# Patient Record
Sex: Male | Born: 1950 | Race: White | Hispanic: No | Marital: Single | Smoking: Never smoker
Health system: Southern US, Community
[De-identification: ages and names within clinical notes are randomized; demographics above are authoritative.]

## PROBLEM LIST (undated history)

## (undated) DIAGNOSIS — J449 Chronic obstructive pulmonary disease, unspecified: Secondary | ICD-10-CM

## (undated) DIAGNOSIS — I4891 Unspecified atrial fibrillation: Secondary | ICD-10-CM

## (undated) DIAGNOSIS — G5793 Unspecified mononeuropathy of bilateral lower limbs: Secondary | ICD-10-CM

## (undated) DIAGNOSIS — G4733 Obstructive sleep apnea (adult) (pediatric): Secondary | ICD-10-CM

## (undated) DIAGNOSIS — E291 Testicular hypofunction: Secondary | ICD-10-CM

## (undated) DIAGNOSIS — Z87442 Personal history of urinary calculi: Secondary | ICD-10-CM

## (undated) DIAGNOSIS — N261 Atrophy of kidney (terminal): Secondary | ICD-10-CM

## (undated) DIAGNOSIS — I82409 Acute embolism and thrombosis of unspecified deep veins of unspecified lower extremity: Secondary | ICD-10-CM

## (undated) DIAGNOSIS — G4730 Sleep apnea, unspecified: Secondary | ICD-10-CM

## (undated) DIAGNOSIS — C801 Malignant (primary) neoplasm, unspecified: Secondary | ICD-10-CM

## (undated) DIAGNOSIS — T8859XA Other complications of anesthesia, initial encounter: Secondary | ICD-10-CM

## (undated) DIAGNOSIS — J189 Pneumonia, unspecified organism: Secondary | ICD-10-CM

## (undated) DIAGNOSIS — D333 Benign neoplasm of cranial nerves: Secondary | ICD-10-CM

## (undated) DIAGNOSIS — M1990 Unspecified osteoarthritis, unspecified site: Secondary | ICD-10-CM

## (undated) DIAGNOSIS — D689 Coagulation defect, unspecified: Secondary | ICD-10-CM

## (undated) DIAGNOSIS — K219 Gastro-esophageal reflux disease without esophagitis: Secondary | ICD-10-CM

## (undated) DIAGNOSIS — R112 Nausea with vomiting, unspecified: Secondary | ICD-10-CM

## (undated) DIAGNOSIS — F329 Major depressive disorder, single episode, unspecified: Secondary | ICD-10-CM

## (undated) DIAGNOSIS — Z9889 Other specified postprocedural states: Secondary | ICD-10-CM

## (undated) DIAGNOSIS — M329 Systemic lupus erythematosus, unspecified: Secondary | ICD-10-CM

## (undated) DIAGNOSIS — N189 Chronic kidney disease, unspecified: Secondary | ICD-10-CM

## (undated) DIAGNOSIS — T7840XA Allergy, unspecified, initial encounter: Secondary | ICD-10-CM

## (undated) DIAGNOSIS — I10 Essential (primary) hypertension: Secondary | ICD-10-CM

## (undated) DIAGNOSIS — F419 Anxiety disorder, unspecified: Secondary | ICD-10-CM

## (undated) DIAGNOSIS — D649 Anemia, unspecified: Secondary | ICD-10-CM

## (undated) DIAGNOSIS — K635 Polyp of colon: Secondary | ICD-10-CM

## (undated) DIAGNOSIS — M722 Plantar fascial fibromatosis: Secondary | ICD-10-CM

## (undated) DIAGNOSIS — T4145XA Adverse effect of unspecified anesthetic, initial encounter: Secondary | ICD-10-CM

## (undated) DIAGNOSIS — F32A Depression, unspecified: Secondary | ICD-10-CM

## (undated) HISTORY — PX: UPPER GASTROINTESTINAL ENDOSCOPY: SHX188

## (undated) HISTORY — DX: Testicular hypofunction: E29.1

## (undated) HISTORY — DX: Unspecified atrial fibrillation: I48.91

## (undated) HISTORY — PX: KIDNEY STONE SURGERY: SHX686

## (undated) HISTORY — DX: Benign neoplasm of cranial nerves: D33.3

## (undated) HISTORY — DX: Acute embolism and thrombosis of unspecified deep veins of unspecified lower extremity: I82.409

## (undated) HISTORY — DX: Atrophy of kidney (terminal): N26.1

## (undated) HISTORY — DX: Allergy, unspecified, initial encounter: T78.40XA

## (undated) HISTORY — DX: Systemic lupus erythematosus, unspecified: M32.9

## (undated) HISTORY — PX: TONSILLECTOMY: SUR1361

## (undated) HISTORY — DX: Obstructive sleep apnea (adult) (pediatric): G47.33

## (undated) HISTORY — DX: Sleep apnea, unspecified: G47.30

## (undated) HISTORY — DX: Major depressive disorder, single episode, unspecified: F32.9

## (undated) HISTORY — DX: Polyp of colon: K63.5

## (undated) HISTORY — DX: Coagulation defect, unspecified: D68.9

## (undated) HISTORY — PX: MOHS SURGERY: SHX181

## (undated) HISTORY — DX: Chronic obstructive pulmonary disease, unspecified: J44.9

## (undated) HISTORY — DX: Depression, unspecified: F32.A

## (undated) HISTORY — DX: Anemia, unspecified: D64.9

## (undated) HISTORY — DX: Plantar fascial fibromatosis: M72.2

---

## 2005-07-05 HISTORY — PX: CHEST WALL TUMOR EXCISION: SUR562

## 2006-07-05 HISTORY — PX: OTHER SURGICAL HISTORY: SHX169

## 2007-07-06 HISTORY — PX: OTHER SURGICAL HISTORY: SHX169

## 2008-10-17 ENCOUNTER — Encounter: Payer: Self-pay | Admitting: Family Medicine

## 2009-10-03 HISTORY — PX: COLONOSCOPY: SHX174

## 2009-10-15 ENCOUNTER — Encounter: Payer: Self-pay | Admitting: Family Medicine

## 2010-02-26 ENCOUNTER — Encounter: Payer: Self-pay | Admitting: Family Medicine

## 2010-03-06 ENCOUNTER — Encounter: Payer: Self-pay | Admitting: Family Medicine

## 2010-04-07 ENCOUNTER — Ambulatory Visit: Payer: Self-pay | Admitting: Family Medicine

## 2010-04-07 DIAGNOSIS — M722 Plantar fascial fibromatosis: Secondary | ICD-10-CM

## 2010-04-07 DIAGNOSIS — F418 Other specified anxiety disorders: Secondary | ICD-10-CM

## 2010-04-07 DIAGNOSIS — N138 Other obstructive and reflux uropathy: Secondary | ICD-10-CM | POA: Insufficient documentation

## 2010-04-07 DIAGNOSIS — E291 Testicular hypofunction: Secondary | ICD-10-CM

## 2010-04-07 DIAGNOSIS — G4733 Obstructive sleep apnea (adult) (pediatric): Secondary | ICD-10-CM | POA: Insufficient documentation

## 2010-04-07 DIAGNOSIS — J45909 Unspecified asthma, uncomplicated: Secondary | ICD-10-CM | POA: Insufficient documentation

## 2010-04-07 DIAGNOSIS — N529 Male erectile dysfunction, unspecified: Secondary | ICD-10-CM

## 2010-04-07 DIAGNOSIS — D333 Benign neoplasm of cranial nerves: Secondary | ICD-10-CM

## 2010-04-07 DIAGNOSIS — N401 Enlarged prostate with lower urinary tract symptoms: Secondary | ICD-10-CM

## 2010-04-08 DIAGNOSIS — Z860100 Personal history of colon polyps, unspecified: Secondary | ICD-10-CM | POA: Insufficient documentation

## 2010-04-08 DIAGNOSIS — Z87442 Personal history of urinary calculi: Secondary | ICD-10-CM

## 2010-08-04 NOTE — Letter (Signed)
Summary: Records from Dr. Estella Husk in [place] 2006 - 2011  Records from Dr. Estella Husk in [place] 2006 - 2011   Imported By: Maryln Gottron 04/16/2010 13:05:23  _____________________________________________________________________  External Attachment:    Type:   Image     Comment:   External Document

## 2010-08-04 NOTE — Letter (Signed)
Summary: Records from Dr. Lorelle Gibbs 2007 - 2010  Records from Dr. Daron Offer Megerian 2007 - 2010   Imported By: Maryln Gottron 05/04/2010 10:42:30  _____________________________________________________________________  External Attachment:    Type:   Image     Comment:   External Document

## 2010-08-04 NOTE — Letter (Signed)
Summary: Records from Dr. Estella Husk 2007 - 2011  Records from Dr. Estella Husk 2007 - 2011   Imported By: Maryln Gottron 04/28/2010 08:44:01  _____________________________________________________________________  External Attachment:    Type:   Image     Comment:   External Document

## 2010-08-04 NOTE — Assessment & Plan Note (Signed)
Summary: TO BE EST/NJR   Vital Signs:  Patient profile:   60 year old male Weight:      240 pounds O2 Sat:      95 % Temp:     99 degrees F Pulse rate:   84 / minute BP sitting:   130 / 84  (left arm)  Vitals Entered By: Pura Spice, RN (April 07, 2010 1:37 PM) CC: to establish congestion   History of Present Illness: 60 yr old male to establish with [place] after moving to [place] from [place], [place] a few weeks ago. He is doing well and has no concerns. He started on Androgel about 6 months ago for a low testosterone level, and he feels better with more energy and more libido. He had been taking Effexor for a few years for  depression, but he has weaned himself off that and is doing fine. He has retired but will look for a new job here after he gets more settled.   Preventive Screening-Counseling & Management  Alcohol-Tobacco     Smoking Status: never  Allergies (verified): 1)  ! Pcn 2)  ! Sulfa 3)  ! 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 Family History of Arthritis Family History of CAD male 1st degree relative <60 Family History High cholesterol Family History Diabetes 1st degree relative Family History of Stroke M 1st degree relative <50  Social History: Reviewed history and no changes required. Single Never Smoked Alcohol use-no Smoking Status:  never  Review of Systems  The patient denies anorexia, fever, weight loss, weight gain,  vision loss, decreased hearing, hoarseness, chest pain, syncope, dyspnea on exertion, peripheral edema, prolonged cough, headaches, hemoptysis, abdominal pain, melena, hematochezia, severe indigestion/heartburn, hematuria, incontinence, genital sores, muscle weakness, suspicious skin lesions, transient blindness, difficulty walking, depression, unusual weight change, abnormal bleeding, enlarged lymph nodes, angioedema, breast masses, and testicular masses.    Physical Exam  General:  Well-developed,well-nourished,in no acute distress; alert,appropriate and cooperative throughout examination Neck:  No deformities, masses, or tenderness noted. Lungs:  Normal respiratory effort, chest expands symmetrically. Lungs are clear to auscultation, no crackles or wheezes. Heart:  Normal rate and regular rhythm. S1 and S2 normal without gallop, murmur, click, rub or other extra sounds.   Impression & Recommendations:  Problem # 1:  DEPRESSION (ICD-311)  Problem # 2:  ASTHMA (ICD-493.90)  His updated medication list for this problem includes:    Ventolin Hfa 108 (90 Base) Mcg/act Aers (Albuterol sulfate)  Problem # 3:  SLEEP APNEA, OBSTRUCTIVE (ICD-327.23)  Problem # 4:  BENIGN PROSTATIC HYPERTROPHY, WITH OBSTRUCTION (ICD-600.01)  Problem # 5:  ERECTILE DYSFUNCTION, ORGANIC (ICD-607.84)  His updated medication list for this problem includes:    Viagra 100 Mg Tabs (Sildenafil citrate)  Problem # 6:  HYPOGONADISM (ICD-257.2)  Problem # 7:  PLANTAR FASCIITIS (ICD-728.71)  Problem # 8:  ACOUSTIC NEUROMA (ICD-225.1)  Complete Medication List: 1)  Flomax 0.4 Mg Caps (Tamsulosin hcl) 2)  Androgel Pump 1.25 Gm/act (1%) Gel (Testosterone) .... 4  pumps once daily 3)  Viagra 100 Mg Tabs (Sildenafil citrate) 4)  Ventolin Hfa 108 (90 Base) Mcg/act Aers (Albuterol sulfate) 5)  Aspirin 325 Mg Tabs (Aspirin)  Patient Instructions: 1)  Get old records. set up a cpx soon.

## 2010-08-04 NOTE — Letter (Signed)
Summary: Records from Dr. Carolynn Sayers, [facility] 2010 - 2011  Records from Dr. Carolynn Sayers, [facility] 2010 - 2011   Imported By: Maryln Gottron 04/21/2010 10:47:47  _____________________________________________________________________  External Attachment:    Type:   Image     Comment:   External Document

## 2010-10-23 ENCOUNTER — Ambulatory Visit (INDEPENDENT_AMBULATORY_CARE_PROVIDER_SITE_OTHER): Payer: BC Managed Care – PPO | Admitting: Family Medicine

## 2010-10-23 ENCOUNTER — Encounter: Payer: Self-pay | Admitting: Family Medicine

## 2010-10-23 VITALS — BP 110/78 | HR 80

## 2010-10-23 DIAGNOSIS — M25561 Pain in right knee: Secondary | ICD-10-CM

## 2010-10-23 DIAGNOSIS — M25569 Pain in unspecified knee: Secondary | ICD-10-CM

## 2010-10-23 MED ORDER — TESTOSTERONE 12.5 MG/ACT (1%) TD GEL: 4.0000 | Freq: Every day | TRANSDERMAL | Status: DC

## 2010-10-23 NOTE — Progress Notes (Signed)
  Subjective:    Patient ID: Joseph Tran, male    DOB: 24-Aug-1950, 60 y.o.   MRN: [id number]  HPI Here with right knee pain that started after he mowed his lawn 3 weeks ago, and it has persisted ever since. It is a sharp severe pain in the medial knee. No swelling or locking. He says it feels exactly like it did in 2008 when he had a torn meniscus while living in [place], [place]. He had this repaired arthroscopically. Now taking Tylenol and wearing the same metal brace he used back then.   Review of Systems  Constitutional: Negative.   Musculoskeletal: Positive for arthralgias.       Objective:   Physical Exam  Constitutional:       In pain, limping   Musculoskeletal:       Very tender in the medial joint space on the right knee. No edema. Full ROM but he has a lot of pain through medium flexion. No crepitus. 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 Use Motrin and ice. Refer to Orthopedics

## 2010-12-17 ENCOUNTER — Telehealth: Payer: Self-pay | Admitting: Family Medicine

## 2010-12-17 NOTE — Telephone Encounter (Signed)
Needs new rx for generic Flomax 0.4mg  sent to [facility]. Was rx'd by previous dr.

## 2010-12-21 MED ORDER — TAMSULOSIN HCL 0.4 MG PO CAPS: 0.4000 mg | ORAL_CAPSULE | Freq: Every day | ORAL | Status: DC

## 2010-12-21 NOTE — Telephone Encounter (Signed)
done

## 2010-12-28 ENCOUNTER — Telehealth: Payer: Self-pay | Admitting: Family Medicine

## 2010-12-28 NOTE — Telephone Encounter (Signed)
[facility] needs re-clarification on Andorgel 1% pump. Pls call asap.

## 2010-12-30 ENCOUNTER — Telehealth: Payer: Self-pay | Admitting: *Deleted

## 2010-12-30 NOTE — Telephone Encounter (Signed)
Needs clarification on Androgel 1% pump rx, need quantity and day supply.  The pumps are available in pre packaged boxes of 2 pumps per box that are 75 grams each.  We can not break the packaging.  The fax we received was for 3 bottle.  Please call [phone] option 0

## 2010-12-31 MED ORDER — TESTOSTERONE 12.5 MG/ACT (1%) TD GEL: 4.0000 | Freq: Every day | TRANSDERMAL | Status: DC

## 2010-12-31 NOTE — Telephone Encounter (Signed)
New rx faxed to pharmacy. 

## 2011-01-11 ENCOUNTER — Telehealth: Payer: Self-pay | Admitting: Family Medicine

## 2011-01-11 NOTE — Telephone Encounter (Signed)
Pt called and stated that [facility] would not fill because # of boxes was not on request. Last time pt had refilled it was for 3 boxes and that equals a 3 month supply. Please call and give correct amount.

## 2011-01-11 NOTE — Telephone Encounter (Signed)
[facility] and script is going to ship in 1-3 days and I gave info to pt.

## 2011-03-04 ENCOUNTER — Telehealth: Payer: Self-pay | Admitting: Family Medicine

## 2011-03-04 NOTE — Telephone Encounter (Signed)
Pt was rx'd Klonopin 1mg  prn by his previous doctor. Patient is requesting a new rx sent to [facility] on [address].

## 2011-03-05 NOTE — Telephone Encounter (Signed)
I tried to call both numbers and both are not working.

## 2011-03-05 NOTE — Telephone Encounter (Signed)
Pt called to check on status of getting generic Klonopin. Pls call in to [facility] on [address].

## 2011-03-05 NOTE — Telephone Encounter (Signed)
He would need an OV first

## 2011-04-02 ENCOUNTER — Other Ambulatory Visit (INDEPENDENT_AMBULATORY_CARE_PROVIDER_SITE_OTHER): Payer: BC Managed Care – PPO

## 2011-04-02 DIAGNOSIS — Z0000 Encounter for general adult medical examination without abnormal findings: Secondary | ICD-10-CM

## 2011-04-02 DIAGNOSIS — Z23 Encounter for immunization: Secondary | ICD-10-CM

## 2011-04-02 LAB — CBC WITH DIFFERENTIAL/PLATELET
Basophils Relative: 0.6 % (ref 0.0–3.0)
Eosinophils Absolute: 0.2 10*3/uL (ref 0.0–0.7)
Eosinophils Relative: 3 % (ref 0.0–5.0)
HCT: 47.2 % (ref 39.0–52.0)
Hemoglobin: 15.6 g/dL (ref 13.0–17.0)
Lymphs Abs: 1.1 10*3/uL (ref 0.7–4.0)
MCHC: 33 g/dL (ref 30.0–36.0)
MCV: 95.8 fl (ref 78.0–100.0)
Monocytes Absolute: 0.5 10*3/uL (ref 0.1–1.0)
Neutro Abs: 5 10*3/uL (ref 1.4–7.7)
Neutrophils Relative %: 73.8 % (ref 43.0–77.0)
RBC: 4.93 Mil/uL (ref 4.22–5.81)
WBC: 6.8 10*3/uL (ref 4.5–10.5)

## 2011-04-02 LAB — PSA: PSA: 1.66 ng/mL (ref 0.10–4.00)

## 2011-04-02 LAB — BASIC METABOLIC PANEL
CO2: 27 mEq/L (ref 19–32)
Chloride: 110 mEq/L (ref 96–112)
Creatinine, Ser: 1.4 mg/dL (ref 0.4–1.5)
Potassium: 4.5 mEq/L (ref 3.5–5.1)
Sodium: 143 mEq/L (ref 135–145)

## 2011-04-02 LAB — POCT URINALYSIS DIPSTICK
Ketones, UA: NEGATIVE
Protein, UA: NEGATIVE
Spec Grav, UA: 1.02
pH, UA: 5

## 2011-04-02 LAB — LIPID PANEL
HDL: 40.3 mg/dL (ref >39.00)
Total CHOL/HDL Ratio: 5
Triglycerides: 116 mg/dL (ref 0.0–149.0)

## 2011-04-02 LAB — TESTOSTERONE: Testosterone: 338.36 ng/dL — ABNORMAL LOW (ref 350.00–890.00)

## 2011-04-02 LAB — HEPATIC FUNCTION PANEL
ALT: 19 U/L (ref 0–53)
Albumin: 3.9 g/dL (ref 3.5–5.2)
Bilirubin, Direct: 0.2 mg/dL (ref 0.0–0.3)
Total Protein: 6.4 g/dL (ref 6.0–8.3)

## 2011-04-02 NOTE — Progress Notes (Signed)
Addended by: Romualdo Bolk on: 04/02/2011 09:13 AM   Modules accepted: Orders

## 2011-04-06 ENCOUNTER — Ambulatory Visit (INDEPENDENT_AMBULATORY_CARE_PROVIDER_SITE_OTHER): Payer: BC Managed Care – PPO | Admitting: Family Medicine

## 2011-04-06 ENCOUNTER — Encounter: Payer: Self-pay | Admitting: Family Medicine

## 2011-04-06 VITALS — BP 120/76 | HR 91 | Temp 98.6°F | Wt 235.0 lb

## 2011-04-06 DIAGNOSIS — R0789 Other chest pain: Secondary | ICD-10-CM

## 2011-04-06 DIAGNOSIS — J45909 Unspecified asthma, uncomplicated: Secondary | ICD-10-CM

## 2011-04-06 DIAGNOSIS — R071 Chest pain on breathing: Secondary | ICD-10-CM

## 2011-04-06 DIAGNOSIS — J45901 Unspecified asthma with (acute) exacerbation: Secondary | ICD-10-CM

## 2011-04-06 MED ORDER — METHYLPREDNISOLONE ACETATE 80 MG/ML IJ SUSP
120.0000 mg | Freq: Once | INTRAMUSCULAR | Status: AC
  Administered 2011-04-06: 120 mg via INTRAMUSCULAR

## 2011-04-06 MED ORDER — AZITHROMYCIN 250 MG PO TABS: ORAL_TABLET | ORAL | Status: AC

## 2011-04-06 MED ORDER — HYDROCODONE-ACETAMINOPHEN 5-500 MG PO TABS: 1.0000 | ORAL_TABLET | ORAL | Status: AC | PRN

## 2011-04-06 MED ORDER — ALBUTEROL SULFATE HFA 108 (90 BASE) MCG/ACT IN AERS: 2.0000 | INHALATION_SPRAY | RESPIRATORY_TRACT | Status: DC | PRN

## 2011-04-06 MED ORDER — HYDROCODONE-HOMATROPINE 5-1.5 MG/5ML PO SYRP: 5.0000 mL | ORAL_SOLUTION | ORAL | Status: DC | PRN

## 2011-04-06 NOTE — Progress Notes (Signed)
  Subjective:    Patient ID: Joseph Tran, male    DOB: 1950/11/06, 60 y.o.   MRN: [id number]  HPI Here for one week of hard dry coughing, SOB, and wheezing. Using his inhaler. Then last night he had the onset of a sharp severe pain at the lower left rib area. It hurts to take a breath now. No fever.    Review of Systems  Constitutional: Negative.   HENT: Negative.   Respiratory: Positive for cough, shortness of breath and wheezing.   Cardiovascular: Positive for chest pain.       Objective:   Physical Exam  Constitutional:       Coughing, winces with pain when he coughs   HENT:  Right Ear: External ear normal.  Left Ear: External ear normal.  Nose: Nose normal.  Mouth/Throat: Oropharynx is clear and moist. No oropharyngeal exudate.  Eyes: Conjunctivae are normal. Pupils are equal, round, and reactive to light.  Neck: No thyromegaly present.  Pulmonary/Chest: Effort normal. He has no rales. He exhibits no tenderness.       Scattered wheezes and rhonchi  Lymphadenopathy:    He has no cervical adenopathy.          Assessment & Plan:  He has bronchitis, and he probably tore an intercostal muscle from coughing. Use meds above. Off work today and tomorrow

## 2011-04-06 NOTE — Progress Notes (Signed)
Addended by: Aniceto Boss A on: 04/06/2011 09:45 AM   Modules accepted: Orders

## 2011-04-07 NOTE — Progress Notes (Signed)
Quick Note:  Pt aware ______ 

## 2011-04-13 ENCOUNTER — Ambulatory Visit (INDEPENDENT_AMBULATORY_CARE_PROVIDER_SITE_OTHER): Payer: BC Managed Care – PPO | Admitting: Family Medicine

## 2011-04-13 ENCOUNTER — Encounter: Payer: Self-pay | Admitting: Family Medicine

## 2011-04-13 VITALS — BP 122/80 | HR 81 | Temp 98.9°F | Ht 73.0 in | Wt 234.0 lb

## 2011-04-13 DIAGNOSIS — Z0000 Encounter for general adult medical examination without abnormal findings: Secondary | ICD-10-CM

## 2011-04-13 MED ORDER — HYDROCODONE-HOMATROPINE 5-1.5 MG/5ML PO SYRP: 5.0000 mL | ORAL_SOLUTION | ORAL | Status: AC | PRN

## 2011-04-13 MED ORDER — SILDENAFIL CITRATE 100 MG PO TABS: 100.0000 mg | ORAL_TABLET | Freq: Every day | ORAL | Status: DC | PRN

## 2011-04-13 MED ORDER — TESTOSTERONE 12.5 MG/ACT (1%) TD GEL: 6.0000 | Freq: Every day | TRANSDERMAL | Status: DC

## 2011-04-13 MED ORDER — PREDNISONE (PAK) 10 MG PO TABS: ORAL_TABLET | ORAL | Status: DC

## 2011-04-13 NOTE — Progress Notes (Signed)
  Subjective:    Patient ID: Joseph Tran, male    DOB: 06-Feb-1951, 60 y.o.   MRN: [id number]  HPI 60 yr old male for a cpx. He has been doing well except for chronic coughing. We saw him one week ago for an URI and gave him a Zpack. He feels better but the dry coughing has persisted. No fever or any other symptoms. He says this is his typical pattern when he gets a URI, that he coughs for weeks after that.    Review of Systems  Constitutional: Negative.   HENT: Negative.   Eyes: Negative.   Respiratory: Positive for cough. Negative for apnea, choking, chest tightness, shortness of breath, wheezing and stridor.   Cardiovascular: Negative.   Gastrointestinal: Negative.   Genitourinary: Negative.   Musculoskeletal: Negative.   Skin: Negative.   Neurological: Negative.   Hematological: Negative.   Psychiatric/Behavioral: Negative.        Objective:   Physical Exam  Constitutional: He is oriented to person, place, and time. He appears well-developed and well-nourished. No distress.  HENT:  Head: Normocephalic and atraumatic.  Right Ear: External ear normal.  Left Ear: External ear normal.  Nose: Nose normal.  Mouth/Throat: Oropharynx is clear and moist. No oropharyngeal exudate.  Eyes: Conjunctivae and EOM are normal. Pupils are equal, round, and reactive to light. Right eye exhibits no discharge. Left eye exhibits no discharge. No scleral icterus.  Neck: Neck supple. No JVD present. No tracheal deviation present. No thyromegaly present.  Cardiovascular: Normal rate, regular rhythm, normal heart sounds and intact distal pulses.  Exam reveals no gallop and no friction rub.   No murmur heard.      EKG normal   Pulmonary/Chest: Effort normal and breath sounds normal. No respiratory distress. He has no wheezes. He has no rales. He exhibits no tenderness.  Abdominal: Soft. Bowel sounds are normal. He exhibits no distension and no mass. There is no tenderness. There is no rebound and no  guarding.  Genitourinary: Rectum normal, prostate normal and penis normal. Guaiac negative stool. No penile tenderness.  Musculoskeletal: Normal range of motion. He exhibits no edema and no tenderness.  Lymphadenopathy:    He has no cervical adenopathy.  Neurological: He is alert and oriented to person, place, and time. He has normal reflexes. No cranial nerve deficit. He exhibits normal muscle tone. Coordination normal.  Skin: Skin is warm and dry. No rash noted. He is not diaphoretic. No erythema. No pallor.  Psychiatric: He has a normal mood and affect. His behavior is normal. Judgment and thought content normal.          Assessment & Plan:  We will increase the Androgel to 6 pumps a day, and recheck a level in 6 months. He will watch his diet closely to get the LDL down. Try a steroid taper pack to help the reactive airways cough.

## 2011-04-29 ENCOUNTER — Telehealth: Payer: Self-pay

## 2011-04-29 DIAGNOSIS — R05 Cough: Secondary | ICD-10-CM

## 2011-04-29 NOTE — Telephone Encounter (Signed)
Before we do anything else, he needs a CXR for this. I have put in the order, so he just needs to go to [facility] to get this done

## 2011-04-29 NOTE — Telephone Encounter (Signed)
Pt called and stated he was given ventolin for his asthma. Pt states the inhaler is not helping.  Pt states he is still coughing and would like to have another rx sent in to the pharmacy.  Pt's pharmacy is [facility] [address]

## 2011-04-29 NOTE — Telephone Encounter (Signed)
Left voice message.

## 2011-04-30 ENCOUNTER — Ambulatory Visit (INDEPENDENT_AMBULATORY_CARE_PROVIDER_SITE_OTHER)
Admission: RE | Admit: 2011-04-30 | Discharge: 2011-04-30 | Disposition: A | Discharge status: Home / Self Care | Payer: BC Managed Care – PPO | Source: Ambulatory Visit | Attending: Family Medicine | Admitting: Family Medicine

## 2011-04-30 DIAGNOSIS — R05 Cough: Secondary | ICD-10-CM

## 2011-05-04 ENCOUNTER — Telehealth: Payer: Self-pay | Admitting: Family Medicine

## 2011-05-04 MED ORDER — LEVOFLOXACIN 500 MG PO TABS: 500.0000 mg | ORAL_TABLET | Freq: Every day | ORAL | Status: AC

## 2011-05-04 NOTE — Telephone Encounter (Signed)
Message copied by Baldemar Friday on Tue May 04, 2011  6:01 PM ------      Message from: Gershon Crane A      Created: Mon May 03, 2011  5:57 AM       He has some emphysema (COPD) and possibly a small patch of pneumonia. Call in Levaquin 500 mg daily for 10 days

## 2011-05-04 NOTE — Telephone Encounter (Signed)
Script called in and left voice message for pt. 

## 2011-05-05 ENCOUNTER — Telehealth: Payer: Self-pay | Admitting: Family Medicine

## 2011-05-05 NOTE — Telephone Encounter (Signed)
I spoke with pt and called in script. I also filled out a work note to return on 05/10/11.

## 2011-05-05 NOTE — Telephone Encounter (Signed)
Message copied by Baldemar Friday on Wed May 05, 2011  9:16 AM ------      Message from: Gershon Crane A      Created: Mon May 03, 2011  5:57 AM       He has some emphysema (COPD) and possibly a small patch of pneumonia. Call in Levaquin 500 mg daily for 10 days

## 2011-05-21 ENCOUNTER — Encounter: Payer: Self-pay | Admitting: Family Medicine

## 2011-05-21 ENCOUNTER — Ambulatory Visit (INDEPENDENT_AMBULATORY_CARE_PROVIDER_SITE_OTHER): Payer: BC Managed Care – PPO | Admitting: Family Medicine

## 2011-05-21 VITALS — BP 128/86 | HR 86 | Temp 98.5°F | Wt 231.0 lb

## 2011-05-21 DIAGNOSIS — J449 Chronic obstructive pulmonary disease, unspecified: Secondary | ICD-10-CM

## 2011-05-21 NOTE — Progress Notes (Signed)
  Subjective:    Patient ID: Joseph Tran, male    DOB: [birth year], 60 y.o.   MRN: [id number]  HPI Here to discuss results of his recent CXR. This showed some early emphysema, and he has never been told this before. His last CXR in 2011 was reportedly normal. He has never smoked but he was around a lot of second hand smoke in the workplace for years. He has also had asthma since he was a child. He has no SOB and he can exercise without difficulty.    Review of Systems  Constitutional: Negative.   Respiratory: Negative.   Cardiovascular: Negative.        Objective:   Physical Exam  Constitutional: He appears well-developed and well-nourished.  Cardiovascular: Normal rate, regular rhythm, normal heart sounds and intact distal pulses.   Pulmonary/Chest: Effort normal and breath sounds normal. No respiratory distress. He has no wheezes. He has no rales. He exhibits no tenderness.          Assessment & Plan:  Early COPD. We discussed what to do about this. Proper nutrition and exercise are important. Recheck as planned

## 2011-06-28 ENCOUNTER — Telehealth: Payer: Self-pay

## 2011-06-28 NOTE — Telephone Encounter (Signed)
Pt states his androgel rx was written for the incorrect amount. Pt needs a new rx sent to [facility] stating 4 boxes of 150 mg for a 90 day supply.  Please call pt to verify this has been sent.  Thanks.

## 2011-06-30 MED ORDER — TESTOSTERONE 12.5 MG/ACT (1%) TD GEL: 6.0000 "application " | Freq: Every day | TRANSDERMAL | Status: DC

## 2011-06-30 NOTE — Telephone Encounter (Signed)
Script faxed and pt aware. 

## 2011-09-10 HISTORY — PX: KNEE ARTHROSCOPY: SHX127

## 2011-10-27 ENCOUNTER — Telehealth: Payer: Self-pay | Admitting: Family Medicine

## 2011-10-27 NOTE — Telephone Encounter (Signed)
Pt said that previous physician, in [place] Dr Jill Poling, faxed a letter to Dr Clent Ridges on 10/21/11 re: pt med generic Klonopin 1 mg take 1/2 to 1 tab twice daily prn. Pt said that he is needing to get a refill of this medicine called in [facility] [address] [address] [phone].  Pt has only 3 days worth of med remaining. Pls call pt if there is a problem.

## 2011-10-28 NOTE — Telephone Encounter (Signed)
Call in Clonazepam 1 mg bid prn anxiety, #60 with 5 rf

## 2011-10-29 MED ORDER — CLONAZEPAM 1 MG PO TABS: 1.0000 mg | ORAL_TABLET | Freq: Two times a day (BID) | ORAL | Status: DC | PRN

## 2011-10-29 NOTE — Telephone Encounter (Signed)
Script called in

## 2011-12-14 ENCOUNTER — Ambulatory Visit (INDEPENDENT_AMBULATORY_CARE_PROVIDER_SITE_OTHER): Payer: BC Managed Care – PPO | Admitting: Family Medicine

## 2011-12-14 ENCOUNTER — Encounter: Payer: Self-pay | Admitting: Family Medicine

## 2011-12-14 VITALS — BP 118/76 | HR 81 | Temp 98.3°F | Wt 227.0 lb

## 2011-12-14 DIAGNOSIS — N138 Other obstructive and reflux uropathy: Secondary | ICD-10-CM

## 2011-12-14 DIAGNOSIS — N139 Obstructive and reflux uropathy, unspecified: Secondary | ICD-10-CM

## 2011-12-14 DIAGNOSIS — N50819 Testicular pain, unspecified: Secondary | ICD-10-CM

## 2011-12-14 DIAGNOSIS — E291 Testicular hypofunction: Secondary | ICD-10-CM

## 2011-12-14 DIAGNOSIS — N401 Enlarged prostate with lower urinary tract symptoms: Secondary | ICD-10-CM

## 2011-12-14 DIAGNOSIS — N509 Disorder of male genital organs, unspecified: Secondary | ICD-10-CM

## 2011-12-14 DIAGNOSIS — E785 Hyperlipidemia, unspecified: Secondary | ICD-10-CM

## 2011-12-14 MED ORDER — TESTOSTERONE 12.5 MG/ACT (1%) TD GEL: 6.0000 "application " | Freq: Every day | TRANSDERMAL | Status: DC

## 2011-12-14 NOTE — Progress Notes (Signed)
  Subjective:    Patient ID: Dhilan Brauer, male    DOB: July 27, 1950, 61 y.o.   MRN: [id number]  HPI Here for intermittent pain in the left testicle for the past 3 months. No hx of trauma. No swelling. No urinary symptoms. No fever. The pain is never severe but is very uncomfortable.    Review of Systems  Constitutional: Negative.   Gastrointestinal: Negative.   Genitourinary: Positive for testicular pain. Negative for dysuria, urgency, frequency, hematuria, decreased urine volume, discharge, penile swelling, scrotal swelling, difficulty urinating and penile pain.       Objective:   Physical Exam  Constitutional: He appears well-developed and well-nourished.  Abdominal: Soft. Bowel sounds are normal. He exhibits no distension and no mass. There is no tenderness. There is no rebound and no guarding.       No hernias   Genitourinary: Rectum normal, prostate normal and penis normal. Guaiac negative stool. No penile tenderness.       No testicular tenderness or masses           Assessment & Plan:  It is unclear what the etiology of his pain may be. We will refer to Urology

## 2011-12-20 ENCOUNTER — Other Ambulatory Visit (INDEPENDENT_AMBULATORY_CARE_PROVIDER_SITE_OTHER): Payer: BC Managed Care – PPO

## 2011-12-20 DIAGNOSIS — E291 Testicular hypofunction: Secondary | ICD-10-CM

## 2011-12-20 DIAGNOSIS — N139 Obstructive and reflux uropathy, unspecified: Secondary | ICD-10-CM

## 2011-12-20 DIAGNOSIS — E785 Hyperlipidemia, unspecified: Secondary | ICD-10-CM

## 2011-12-20 DIAGNOSIS — N401 Enlarged prostate with lower urinary tract symptoms: Secondary | ICD-10-CM

## 2011-12-20 LAB — PSA: PSA: 2.76 ng/mL (ref 0.10–4.00)

## 2011-12-20 LAB — LIPID PANEL
Cholesterol: 195 mg/dL (ref 0–200)
HDL: 44 mg/dL (ref >39.00)
LDL Cholesterol: 133 mg/dL — ABNORMAL HIGH (ref 0–99)
Triglycerides: 90 mg/dL (ref 0.0–149.0)

## 2011-12-20 LAB — HEPATIC FUNCTION PANEL
AST: 22 U/L (ref 0–37)
Albumin: 3.7 g/dL (ref 3.5–5.2)
Alkaline Phosphatase: 62 U/L (ref 39–117)
Total Protein: 6.6 g/dL (ref 6.0–8.3)

## 2011-12-22 ENCOUNTER — Encounter: Payer: Self-pay | Admitting: Family Medicine

## 2011-12-22 NOTE — Progress Notes (Signed)
Quick Note:  I tried to reach pt by phone, no answer. I put a copy of results in mail. ______ 

## 2012-01-21 ENCOUNTER — Other Ambulatory Visit: Payer: Self-pay | Admitting: *Deleted

## 2012-01-21 MED ORDER — ALBUTEROL SULFATE HFA 108 (90 BASE) MCG/ACT IN AERS: 2.0000 | INHALATION_SPRAY | RESPIRATORY_TRACT | Status: DC | PRN

## 2012-02-11 ENCOUNTER — Telehealth: Payer: Self-pay

## 2012-02-11 MED ORDER — CLONAZEPAM 1 MG PO TABS: 1.0000 mg | ORAL_TABLET | Freq: Two times a day (BID) | ORAL | Status: DC | PRN

## 2012-02-11 NOTE — Telephone Encounter (Signed)
Fax [facility] for 90 day supply with RF on clonazepam  Last seen 12/14/11 testicle pain Last written 4/26/3 # 60 5RF (called to [facility]) Please advise - mailorder

## 2012-02-11 NOTE — Telephone Encounter (Signed)
Faxed to [facility]

## 2012-02-11 NOTE — Telephone Encounter (Signed)
Call in #180 with 1 rf 

## 2012-03-16 ENCOUNTER — Ambulatory Visit (INDEPENDENT_AMBULATORY_CARE_PROVIDER_SITE_OTHER): Payer: BC Managed Care – PPO

## 2012-03-16 DIAGNOSIS — Z23 Encounter for immunization: Secondary | ICD-10-CM

## 2012-04-17 ENCOUNTER — Ambulatory Visit (INDEPENDENT_AMBULATORY_CARE_PROVIDER_SITE_OTHER): Payer: BC Managed Care – PPO | Admitting: Family Medicine

## 2012-04-17 ENCOUNTER — Encounter: Payer: Self-pay | Admitting: Family Medicine

## 2012-04-17 VITALS — BP 110/72 | HR 69 | Temp 98.4°F | Wt 210.0 lb

## 2012-04-17 DIAGNOSIS — E291 Testicular hypofunction: Secondary | ICD-10-CM

## 2012-04-17 DIAGNOSIS — F329 Major depressive disorder, single episode, unspecified: Secondary | ICD-10-CM

## 2012-04-17 NOTE — Progress Notes (Signed)
  Subjective:    Patient ID: Joseph Tran, male    DOB: 1950-07-11, 61 y.o.   MRN: [id number]  HPI Here to discuss low testosterone and depression. He had been on medication for depression for 30 years in [place], and then he tapered off them. His last med was Effexor, and he stopped this in June 2012. Now the depression is back, he feels sad most of the time, and he has crying spells for no reason. He has been seeing Dr. Vernie Ammons for the low testosterone. He tried 3 topical meds with poor results, and now they are considering trying shots. Joseph Tran wants to make sure there is not a pituitary problem or other hormonal problem. He doesn't want to go back on depression meds if he can avoid it.   Review of Systems  Constitutional: Positive for fatigue. Negative for activity change and appetite change.  Neurological: Negative.   Psychiatric/Behavioral: Positive for dysphoric mood. Negative for suicidal ideas, confusion, disturbed wake/sleep cycle, decreased concentration and agitation. The patient is not nervous/anxious.        Objective:   Physical Exam  Constitutional: He is oriented to person, place, and time. He appears well-developed and well-nourished.  Neurological: He is alert and oriented to person, place, and time.  Psychiatric: He has a normal mood and affect. His behavior is normal. Thought content normal.          Assessment & Plan:  He is depressed, but we want to rule out any metabolic causes for this. We will get labs today. If these are not revealing, then we will address the depression, either with meds or therapy. As for the low tsetosterone, get labs today including a TSH, LH, FSH, and cortisol.

## 2012-04-18 LAB — CBC WITH DIFFERENTIAL/PLATELET
Basophils Absolute: 0 10*3/uL (ref 0.0–0.1)
Eosinophils Relative: 2 % (ref 0.0–5.0)
HCT: 52.2 % — ABNORMAL HIGH (ref 39.0–52.0)
Lymphocytes Relative: 19.5 % (ref 12.0–46.0)
Lymphs Abs: 1.2 10*3/uL (ref 0.7–4.0)
Monocytes Relative: 6 % (ref 3.0–12.0)
Neutrophils Relative %: 72 % (ref 43.0–77.0)
Platelets: 203 10*3/uL (ref 150.0–400.0)
RDW: 12.8 % (ref 11.5–14.6)
WBC: 5.9 10*3/uL (ref 4.5–10.5)

## 2012-04-18 LAB — BASIC METABOLIC PANEL
BUN: 18 mg/dL (ref 6–23)
Creatinine, Ser: 1.4 mg/dL (ref 0.4–1.5)
GFR: 57.14 mL/min — ABNORMAL LOW (ref >60.00)
Potassium: 4.2 mEq/L (ref 3.5–5.1)

## 2012-04-18 LAB — HEPATIC FUNCTION PANEL
ALT: 23 U/L (ref 0–53)
AST: 25 U/L (ref 0–37)
Albumin: 4.1 g/dL (ref 3.5–5.2)
Total Protein: 7.1 g/dL (ref 6.0–8.3)

## 2012-04-18 LAB — TSH: TSH: 1.06 u[IU]/mL (ref 0.35–5.50)

## 2012-04-18 LAB — FOLLICLE STIMULATING HORMONE: FSH: 4.2 m[IU]/mL (ref 1.4–18.1)

## 2012-04-18 LAB — LUTEINIZING HORMONE: LH: 1.22 m[IU]/mL — ABNORMAL LOW (ref 1.50–9.30)

## 2012-04-20 ENCOUNTER — Encounter: Payer: Self-pay | Admitting: Family Medicine

## 2012-04-20 NOTE — Progress Notes (Signed)
Quick Note:  I tried to reach pt by phone, no answer. I put a copy of results in mail. ______ 

## 2012-05-19 ENCOUNTER — Telehealth: Payer: Self-pay | Admitting: Family Medicine

## 2012-05-19 NOTE — Telephone Encounter (Signed)
Pt is going to be starting testosterone injections soon and he wants to know if he should have his cholesterol checked first? He did read that a possible side effect of the injections could raise his cholesterol.

## 2012-05-19 NOTE — Telephone Encounter (Signed)
We checked his cholesterol just 5 months ago so I don't think he needs to do it yet

## 2012-05-22 NOTE — Telephone Encounter (Signed)
I left a voice message with the below information. 

## 2012-05-29 ENCOUNTER — Other Ambulatory Visit: Payer: Self-pay | Admitting: Family Medicine

## 2012-05-29 NOTE — Telephone Encounter (Signed)
Call in Wellbutrin XL 150 mg a day, #90 with one rf

## 2012-05-29 NOTE — Telephone Encounter (Signed)
Pt states the testosterone has not helped his depression. Would like to pursue the antidepressant that was discussed , WELLBUTRIN, (lowest dose possible). Pt would like generic Wellbutrin, 90 day supply, sent to [facility] (on file)

## 2012-05-30 MED ORDER — BUPROPION HCL ER (XL) 150 MG PO TB24: 150.0000 mg | ORAL_TABLET | Freq: Every day | ORAL | Status: DC

## 2012-05-30 NOTE — Telephone Encounter (Signed)
I printed script and faxed, also spoke with pt.  

## 2012-06-07 ENCOUNTER — Telehealth: Payer: Self-pay | Admitting: Family Medicine

## 2012-06-07 NOTE — Telephone Encounter (Signed)
[facility] Triage Call Report Triage Record Num: [id number] Operator: Albertine Grates Patient Name: Joseph Tran Call Date & Time: 06/06/2012 5:20:18PM Patient Phone: [phone] PCP: Tera Mater. Clent Ridges Patient Gender: Male PCP Fax : [phone] Patient DOB: 1951-01-18 Practice Name: [facility] Reason for Call: Caller: Jak/Patient; PCP: Gershon Crane [facility]); CB#: [phone]; MD prescribed Wellbutrin and patient has seen a urologist. Was prescribed Doxycycline. Is wanting to be sure can take both medicines at same time. Advised per [email or web address] no interactons between medicines. Protocol(s) Used: Office Note Recommended Outcome per Protocol: Information Noted and Sent to Office Reason for Outcome: Caller information to office Care Advice: ~ 12/03/

## 2012-07-10 ENCOUNTER — Ambulatory Visit (INDEPENDENT_AMBULATORY_CARE_PROVIDER_SITE_OTHER): Payer: BC Managed Care – PPO | Admitting: Family Medicine

## 2012-07-10 ENCOUNTER — Encounter: Payer: Self-pay | Admitting: Family Medicine

## 2012-07-10 VITALS — BP 118/80 | HR 93 | Temp 98.1°F | Wt 202.0 lb

## 2012-07-10 DIAGNOSIS — M545 Low back pain: Secondary | ICD-10-CM

## 2012-07-10 MED ORDER — METHYLPREDNISOLONE 4 MG PO KIT: PACK | ORAL | Status: DC

## 2012-07-10 MED ORDER — HYDROCODONE-ACETAMINOPHEN 5-325 MG PO TABS: 1.0000 | ORAL_TABLET | Freq: Four times a day (QID) | ORAL | Status: DC | PRN

## 2012-07-10 MED ORDER — CYCLOBENZAPRINE HCL 10 MG PO TABS
10.0000 mg | ORAL_TABLET | Freq: Three times a day (TID) | ORAL | Status: DC | PRN
Start: 2012-07-10 — End: 2012-11-17

## 2012-07-10 NOTE — Progress Notes (Signed)
  Subjective:    Patient ID: Joseph Tran, male    DOB: Nov 05, 1950, 62 y.o.   MRN: [id number]  HPI Here for 3 days of sharp pains in the lower back, worse on the right side. No pain in the legs. This started when he leaned forward and turned to the side to fold up an ironing board. The back is stiff and tight. Advil helps somewhat.    Review of Systems  Constitutional: Negative.   Musculoskeletal: Positive for back pain.       Objective:   Physical Exam  Constitutional:       In pain  Musculoskeletal:       Tender over the lower back with spasm and reduced ROM          Assessment & Plan:  Rest, heat, Flexeril, Medrol dose pack, and Vicodin pen. Recheck prn

## 2012-09-01 ENCOUNTER — Telehealth: Payer: Self-pay | Admitting: Family Medicine

## 2012-09-01 NOTE — Telephone Encounter (Signed)
I spoke with pt and he gained weight on the Effexor, that is why he stopped that medication. Can you recommend something else?

## 2012-09-01 NOTE — Telephone Encounter (Signed)
Pt would like to know if there is something else he could take in place of  buPROPion (WELLBUTRIN XL) 150 MG 24 hr tablet  . Pt has gained weight w/ Wellburtin. (12 lds so far). Also does not feel his depression is any better taking this med.  -pt states he discussed w/ MD his concerns last visit. Pharm [facility]  Generic preferred

## 2012-09-01 NOTE — Telephone Encounter (Signed)
I suggest he get back on Effexor since he did well on this in the past. Stop the Wellbutrin (no taper is necessary) and start on effexor xr 75 mg daily. Call in #30 with 2 rf

## 2012-09-02 NOTE — Telephone Encounter (Signed)
Okay, DC the effexor and try Lexapro 10 mg a day. Call in #30 with 2 rf

## 2012-09-04 MED ORDER — ESCITALOPRAM OXALATE 10 MG PO TABS: 10.0000 mg | ORAL_TABLET | Freq: Every day | ORAL | Status: DC

## 2012-09-04 NOTE — Telephone Encounter (Signed)
I sent in new script and left voice message for pt with below information.

## 2012-11-17 ENCOUNTER — Ambulatory Visit (INDEPENDENT_AMBULATORY_CARE_PROVIDER_SITE_OTHER): Payer: BC Managed Care – PPO | Admitting: Family Medicine

## 2012-11-17 ENCOUNTER — Encounter: Payer: Self-pay | Admitting: Family Medicine

## 2012-11-17 VITALS — BP 120/76 | HR 89 | Temp 98.3°F | Wt 216.0 lb

## 2012-11-17 DIAGNOSIS — J020 Streptococcal pharyngitis: Secondary | ICD-10-CM

## 2012-11-17 MED ORDER — CEPHALEXIN 500 MG PO CAPS: 500.0000 mg | ORAL_CAPSULE | Freq: Three times a day (TID) | ORAL | Status: AC

## 2012-11-20 ENCOUNTER — Encounter: Payer: Self-pay | Admitting: Family Medicine

## 2012-11-20 NOTE — Progress Notes (Signed)
  Subjective:    Patient ID: Joseph Tran, male    DOB: 1951/04/12, 62 y.o.   MRN: [id number]  HPI Here for 3 days of a bad ST and some PND. No cough or fever.    Review of Systems  Constitutional: Negative.   HENT: Positive for sore throat. Negative for congestion and sinus pressure.   Eyes: Negative.   Respiratory: Negative.        Objective:   Physical Exam  Constitutional: He appears well-developed and well-nourished.  HENT:  Right Ear: External ear normal.  Left Ear: External ear normal.  Nose: Nose normal.  Mouth/Throat: No oropharyngeal exudate.  Posterior OP red   Eyes: Conjunctivae are normal.  Neck: No thyromegaly present.  Tender shotty AC nodes   Pulmonary/Chest: Effort normal and breath sounds normal.          Assessment & Plan:  Use Advil and Cloraseptic prn

## 2012-11-29 ENCOUNTER — Other Ambulatory Visit (INDEPENDENT_AMBULATORY_CARE_PROVIDER_SITE_OTHER): Payer: BC Managed Care – PPO

## 2012-11-29 DIAGNOSIS — Z0000 Encounter for general adult medical examination without abnormal findings: Secondary | ICD-10-CM

## 2012-11-29 LAB — POCT URINALYSIS DIPSTICK
Glucose, UA: NEGATIVE
Leukocytes, UA: NEGATIVE
Nitrite, UA: NEGATIVE
Spec Grav, UA: >=1.03
Urobilinogen, UA: 0.2

## 2012-11-29 LAB — CBC WITH DIFFERENTIAL/PLATELET
Basophils Absolute: 0 10*3/uL (ref 0.0–0.1)
Eosinophils Absolute: 0.2 10*3/uL (ref 0.0–0.7)
HCT: 46.5 % (ref 39.0–52.0)
Lymphocytes Relative: 22 % (ref 12.0–46.0)
Lymphs Abs: 1.4 10*3/uL (ref 0.7–4.0)
Monocytes Relative: 6.3 % (ref 3.0–12.0)
Platelets: 220 10*3/uL (ref 150.0–400.0)
RDW: 12.9 % (ref 11.5–14.6)

## 2012-11-29 LAB — BASIC METABOLIC PANEL
BUN: 11 mg/dL (ref 6–23)
Calcium: 9.1 mg/dL (ref 8.4–10.5)
GFR: 61.75 mL/min (ref >60.00)
Glucose, Bld: 81 mg/dL (ref 70–99)

## 2012-11-29 LAB — LIPID PANEL
Cholesterol: 177 mg/dL (ref 0–200)
LDL Cholesterol: 109 mg/dL — ABNORMAL HIGH (ref 0–99)
Triglycerides: 128 mg/dL (ref 0.0–149.0)

## 2012-11-29 LAB — HEPATIC FUNCTION PANEL
AST: 22 U/L (ref 0–37)
Total Bilirubin: 1 mg/dL (ref 0.3–1.2)

## 2012-11-29 LAB — TSH: TSH: 2.13 u[IU]/mL (ref 0.35–5.50)

## 2012-11-29 NOTE — Progress Notes (Signed)
Quick Note:  Pt has appointment on 12/04/12 will go over then. ______

## 2012-12-04 ENCOUNTER — Encounter: Payer: Self-pay | Admitting: Family Medicine

## 2012-12-04 ENCOUNTER — Ambulatory Visit (INDEPENDENT_AMBULATORY_CARE_PROVIDER_SITE_OTHER): Payer: BC Managed Care – PPO | Admitting: Family Medicine

## 2012-12-04 VITALS — BP 112/70 | HR 100 | Temp 98.5°F | Ht 71.5 in | Wt 213.0 lb

## 2012-12-04 DIAGNOSIS — L989 Disorder of the skin and subcutaneous tissue, unspecified: Secondary | ICD-10-CM

## 2012-12-04 DIAGNOSIS — Z0000 Encounter for general adult medical examination without abnormal findings: Secondary | ICD-10-CM

## 2012-12-04 NOTE — Progress Notes (Signed)
  Subjective:    Patient ID: Joseph Tran, male    DOB: May 11, 1951, 62 y.o.   MRN: [id number]  HPI 62 yr old male for a cpx. He feels well. He asks about a lesion on the right cheek that appeared about a year ago. It does not sem to be changing.    Review of Systems  Constitutional: Negative.   HENT: Negative.   Eyes: Negative.   Respiratory: Negative.   Cardiovascular: Negative.   Gastrointestinal: Negative.   Genitourinary: Negative.   Musculoskeletal: Negative.   Skin: Negative.   Neurological: Negative.   Psychiatric/Behavioral: Negative.        Objective:   Physical Exam  Constitutional: He is oriented to person, place, and time. He appears well-developed and well-nourished. No distress.  HENT:  Head: Normocephalic and atraumatic.  Right Ear: External ear normal.  Left Ear: External ear normal.  Nose: Nose normal.  Mouth/Throat: Oropharynx is clear and moist. No oropharyngeal exudate.  Eyes: Conjunctivae and EOM are normal. Pupils are equal, round, and reactive to light. Right eye exhibits no discharge. Left eye exhibits no discharge. No scleral icterus.  Neck: Neck supple. No JVD present. No tracheal deviation present. No thyromegaly present.  Cardiovascular: Normal rate, regular rhythm, normal heart sounds and intact distal pulses.  Exam reveals no gallop and no friction rub.   No murmur heard. EKG normal   Pulmonary/Chest: Effort normal and breath sounds normal. No respiratory distress. He has no wheezes. He has no rales. He exhibits no tenderness.  Abdominal: Soft. Bowel sounds are normal. He exhibits no distension and no mass. There is no tenderness. There is no rebound and no guarding.  Genitourinary: Rectum normal, prostate normal and penis normal. Guaiac negative stool. No penile tenderness.  Musculoskeletal: Normal range of motion. He exhibits no edema and no tenderness.  Lymphadenopathy:    He has no cervical adenopathy.  Neurological: He is alert and oriented  to person, place, and time. He has normal reflexes. No cranial nerve deficit. He exhibits normal muscle tone. Coordination normal.  Skin: Skin is warm and dry. No rash noted. He is not diaphoretic. No erythema. No pallor.  There is a hypopigmented well defined slightly raised lesion on the right cheek  Psychiatric: He has a normal mood and affect. His behavior is normal. Judgment and thought content normal.          Assessment & Plan:  Well exam. 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 Refer to Dermatology.

## 2013-03-10 ENCOUNTER — Ambulatory Visit (INDEPENDENT_AMBULATORY_CARE_PROVIDER_SITE_OTHER): Payer: BC Managed Care – PPO | Admitting: Internal Medicine

## 2013-03-10 ENCOUNTER — Ambulatory Visit (HOSPITAL_COMMUNITY)
Admission: RE | Admit: 2013-03-10 | Discharge: 2013-03-10 | Disposition: A | Discharge status: Home / Self Care | Payer: BC Managed Care – PPO | Source: Ambulatory Visit | Attending: Internal Medicine | Admitting: Internal Medicine

## 2013-03-10 ENCOUNTER — Encounter: Payer: Self-pay | Admitting: Internal Medicine

## 2013-03-10 VITALS — BP 120/70 | HR 96 | Temp 98.0°F | Wt 222.0 lb

## 2013-03-10 DIAGNOSIS — R059 Cough, unspecified: Secondary | ICD-10-CM | POA: Insufficient documentation

## 2013-03-10 DIAGNOSIS — R509 Fever, unspecified: Secondary | ICD-10-CM

## 2013-03-10 DIAGNOSIS — J45909 Unspecified asthma, uncomplicated: Secondary | ICD-10-CM

## 2013-03-10 DIAGNOSIS — Z8701 Personal history of pneumonia (recurrent): Secondary | ICD-10-CM

## 2013-03-10 DIAGNOSIS — R05 Cough: Secondary | ICD-10-CM | POA: Insufficient documentation

## 2013-03-10 MED ORDER — HYDROCODONE-HOMATROPINE 5-1.5 MG/5ML PO SYRP: 5.0000 mL | ORAL_SOLUTION | ORAL | Status: DC | PRN

## 2013-03-10 NOTE — Patient Instructions (Signed)
This acts like a flu like viral respiratlry infection however  Get chest x ray to be sure no Pneumonia as you have had in the past.   Will contact you about results and need for antibiotic  Otherwise comfort rx with fluids rest cough med if needed and inhaler as needed. Expect  Fever to be gone in 48 hours or so cough can last 2-3 weeks until resolving

## 2013-03-10 NOTE — Progress Notes (Signed)
Chief Complaint  Patient presents with  . Cough    congestion, fever at 4 am this morning, chest congestion/tightness     HPI: Patient comes in today for [facility] Saturday clinic for  new problem evaluation. Onset 2 days ago  Getting  Head cold sx and then cough and  4 am  Had 100  . No chills  Fever for 2 days  No rash  Feels a bit like when had pna  Using inhaler for asthma  No change  Feels malaise  No syncope v or d  No tobacco  No tobacco Exposure  ROS: See pertinent positives and negatives per HPI. No exposures   Past Medical History  Diagnosis Date  . Asthma   . Depression   . Colon polyps   . Sleep apnea     hx  . Acoustic neuroma     benign  . Plantar fasciitis     rt foot  . Hypogonadism male     sees Dr. Vernie Ammons     Family History  Problem Relation Age of Onset  . Heart disease      parents  . Hyperlipidemia    . Stroke      grandparents  . Sudden death      uncle less than 32 yrs old    History   Social History  . Marital Status: Single    Spouse Name: N/A    Number of Children: N/A  . Years of Education: N/A   Social History Main Topics  . Smoking status: Never Smoker   . Smokeless tobacco: Never Used  . Alcohol Use: No  . Drug Use: No  . Sexual Activity: None   Other Topics Concern  . None   Social History Narrative  . None    Outpatient Encounter Prescriptions as of 03/10/2013  Medication Sig Dispense Refill  . albuterol (VENTOLIN HFA) 108 (90 BASE) MCG/ACT inhaler Inhale 2 puffs into the lungs every 4 (four) hours as needed for wheezing or shortness of breath.  1 Inhaler  11  . aspirin 325 MG tablet Take 162 mg by mouth daily.       . clonazePAM (KLONOPIN) 1 MG tablet Take 1 tablet (1 mg total) by mouth 2 (two) times daily as needed.  180 tablet  1  . Cyanocobalamin (VITAMIN B 12 PO) Take by mouth daily.       . fish oil-omega-3 fatty acids 1000 MG capsule Take 2 g by mouth daily.        . Multiple Vitamin (MULTIVITAMIN) tablet Take 1  tablet by mouth daily.        . sildenafil (VIAGRA) 100 MG tablet Take 1 tablet (100 mg total) by mouth daily as needed.  10 tablet  11  . tamsulosin (FLOMAX) 0.4 MG CAPS Take 0.4 mg by mouth daily.      [name] testosterone cypionate (DEPOTESTOTERONE CYPIONATE) 200 MG/ML injection Inject 200 mg into the muscle. Every 3 weeks      . HYDROcodone-homatropine (HYCODAN) 5-1.5 MG/5ML syrup Take 5 mLs by mouth every 4 (four) hours as needed for cough.  180 mL  0   No facility-administered encounter medications on file as of 03/10/2013.    EXAM:  BP 120/70  Pulse 96  Temp(Src) 98 F (36.7 C) (Oral)  Wt 222 lb (100.699 kg)  BMI 30.53 kg/m2  SpO2 95%  Body mass index is 30.53 kg/(m^2).  GENERAL: vitals reviewed and listed above, alert, oriented, appears well hydrated  and in no acute distress looks sick non toxic  very congested and cough .   HEENT: atraumatic, conjunctiva  clear, no obvious abnormalities on inspection of external nose and ears tms nl nared congested no face pain OP : no lesion edema or exudate  NECK: no obvious masses on inspection palpation no jvd or adenopathy  LUNGS: clear to auscultation bilaterally, no wheezes, rales or rhonchi, good air movement CV: HRRR, no clubbing cyanosis or  peripheral edema nl cap refill  MS: moves all extremities without noticeable focal  abnormality PSYCH: pleasant and cooperative,  Cognition intact  ASSESSMENT AND PLAN:  Discussed the following assessment and plan:  Fever, unspecified - Plan: DG Chest 2 View  Cough - Plan: DG Chest 2 View  Hx of bacterial pneumonia - Plan: DG Chest 2 View  ASTHMA Continue inhaler as needed and fluid rest  Check x ray  rx as appropriate for this   If ok then   Expectant management.   -Patient advised to return or notify health care team  if symptoms worsen or persist or new concerns arise.  Patient Instructions  This acts like a flu like viral respiratlry infection however  Get chest x ray to be sure no  Pneumonia as you have had in the past.   Will contact you about results and need for antibiotic  Otherwise comfort rx with fluids rest cough med if needed and inhaler as needed. Expect  Fever to be gone in 48 hours or so cough can last 2-3 weeks until resolving     Wanda K. Panosh M.D.

## 2013-03-10 NOTE — Progress Notes (Signed)
Quick Note:  Per Dr. Fabian Sharp called to make pt aware no pneumonia on chest x-ray. Pt verbalized understanding. ______

## 2013-03-12 ENCOUNTER — Telehealth: Payer: Self-pay | Admitting: Family Medicine

## 2013-03-12 NOTE — Telephone Encounter (Signed)
[facility] Triage Call Report Triage Record Num: [id number] Operator: Jeraldine Loots Patient Name: Joseph Tran Call Date & Time: 03/10/2013 9:06:19AM Patient Phone: [phone] PCP: Tera Mater. Clent Ridges Patient Gender: Male PCP Fax : [phone] Patient DOB: [birth year] Practice Name: [facility] Reason for Call: Caller: Joseph Tran/Patient; PCP: Gershon Crane [facility]); CB#: [phone]; Call regarding Cough/Congestion. Has a hx of asthma. Using his prescribed medications. Has had a temp > 100, muscle aches, productive cough, h/a and nasal congestion with a sore throat. Triaged per Flu Like Sx. Scheduled at [facility] office today at 11 am with Dr. Fabian Sharp. Protocol(s) Used: Flu-Like Symptoms Protocol(s) Used: Upper Respiratory Infection (URI) Recommended Outcome per Protocol: Call Provider within 4 Hours Reason for Outcome: Sudden onset of flu-like symptoms Flu-like symptoms associated with a cough and breathing that is becoming more difficult Care Advice: ~ IMMEDIATE ACTION 09/

## 2013-03-22 ENCOUNTER — Telehealth: Payer: Self-pay | Admitting: Family Medicine

## 2013-03-22 MED ORDER — HYDROCODONE-HOMATROPINE 5-1.5 MG/5ML PO SYRP: 5.0000 mL | ORAL_SOLUTION | ORAL | Status: DC | PRN

## 2013-03-22 NOTE — Telephone Encounter (Signed)
Call in a 240 ml bottle of Hycodan

## 2013-03-22 NOTE — Telephone Encounter (Addendum)
Patient was seen by Dr. Fabian Sharp at Saturday clinic on 03/10/13. She rx'd HYDROcodone-homatropine (HYCODAN) 5-1.5 MG/5ML syrup He is almost out, but still coughing. Requesting refill on it.  PHARMACY: [facility] - [address] - [place] (new store) ph: [phone]

## 2013-03-22 NOTE — Telephone Encounter (Signed)
I called in script, tried to reach pt and no answer or option to leave a message. 

## 2013-03-27 ENCOUNTER — Ambulatory Visit (INDEPENDENT_AMBULATORY_CARE_PROVIDER_SITE_OTHER): Payer: BC Managed Care – PPO

## 2013-03-27 DIAGNOSIS — Z23 Encounter for immunization: Secondary | ICD-10-CM

## 2013-04-03 ENCOUNTER — Telehealth: Payer: Self-pay | Admitting: Family Medicine

## 2013-04-03 NOTE — Telephone Encounter (Signed)
Pt request refill of clonazePAM (KLONOPIN) 1 MG tablet  1/ BId as needed albuterol (VENTOLIN HFA) 108 (90 BASE) MCG/ACT inhaler [facility]  Both 90 day refill

## 2013-04-04 MED ORDER — ALBUTEROL SULFATE HFA 108 (90 BASE) MCG/ACT IN AERS: 2.0000 | INHALATION_SPRAY | RESPIRATORY_TRACT | Status: DC | PRN

## 2013-04-04 NOTE — Telephone Encounter (Signed)
I sent script [facility] for the inhaler. Script for Clonazepam was sent in on 02/11/12 with 1 refill?

## 2013-04-04 NOTE — Telephone Encounter (Signed)
Refill the inhaler for one year, but he already has a refill available for Clonazepam

## 2013-04-05 ENCOUNTER — Telehealth: Payer: Self-pay | Admitting: Family Medicine

## 2013-04-05 MED ORDER — CLONAZEPAM 1 MG PO TABS: 1.0000 mg | ORAL_TABLET | Freq: Two times a day (BID) | ORAL | Status: DC | PRN

## 2013-04-05 MED ORDER — ALBUTEROL SULFATE HFA 108 (90 BASE) MCG/ACT IN AERS: 2.0000 | INHALATION_SPRAY | RESPIRATORY_TRACT | Status: DC | PRN

## 2013-04-05 NOTE — Telephone Encounter (Signed)
Pt called to request that his refill of albuterol (VENTOLIN HFA) 108 (90 BASE) MCG/ACT inhaler be sent to [facility]. [facility] called him to notify him that it was ready, but he is not picking it up because he would [facility] He is also inquiring about the status of his clonazePAM (KLONOPIN) 1 MG tablet 1/ BId refill. He states that he his RX is expired, as it is from 2013. Please assist.

## 2013-04-05 NOTE — Telephone Encounter (Signed)
Okay for Clonazepam #180 with one rf

## 2013-04-05 NOTE — Telephone Encounter (Signed)
Script for Klonopin was approved and faxed to [facility].

## 2013-04-05 NOTE — Telephone Encounter (Signed)
I faxed script 

## 2013-04-05 NOTE — Telephone Encounter (Signed)
I sent script for inhal[facility] to [facility], still waiting on reply for the Klonopin.

## 2013-05-28 ENCOUNTER — Telehealth: Payer: Self-pay | Admitting: Family Medicine

## 2013-05-28 NOTE — Telephone Encounter (Signed)
Pt would like a new rx  Generic wellbutrin 150 mg#90 with [facility]ill [facility]. Pt was prescribed this med was in 2013

## 2013-05-29 MED ORDER — BUPROPION HCL ER (XL) 150 MG PO TB24: 150.0000 mg | ORAL_TABLET | Freq: Every day | ORAL | Status: DC

## 2013-05-29 NOTE — Telephone Encounter (Signed)
Refill for one year 

## 2013-05-29 NOTE — Telephone Encounter (Signed)
Script was sent e-scribe 

## 2013-08-07 ENCOUNTER — Telehealth: Payer: Self-pay | Admitting: Family Medicine

## 2013-08-07 NOTE — Telephone Encounter (Signed)
Pt states he works in [employer] and would like to know if he should get an injection for the measles. 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 Also, should pt get the shingles vac? Pt's ins will pay 100%.

## 2013-08-07 NOTE — Telephone Encounter (Signed)
No need for the measles vaccine but I do recommend the shingles shot

## 2013-08-09 NOTE — Telephone Encounter (Signed)
I spoke with pt  

## 2013-08-10 ENCOUNTER — Ambulatory Visit (INDEPENDENT_AMBULATORY_CARE_PROVIDER_SITE_OTHER): Payer: BC Managed Care – PPO | Admitting: Family Medicine

## 2013-08-10 DIAGNOSIS — Z2911 Encounter for prophylactic immunotherapy for respiratory syncytial virus (RSV): Secondary | ICD-10-CM

## 2013-08-10 DIAGNOSIS — Z23 Encounter for immunization: Secondary | ICD-10-CM

## 2013-08-20 ENCOUNTER — Telehealth: Payer: Self-pay | Admitting: Family Medicine

## 2013-08-20 NOTE — Telephone Encounter (Signed)
Pt is needing new rx for buPROPion (WELLBUTRIN XL) 150 MG 24 hr tablet, sent to [facility] [phone], pt states he is suffering with severe depression and wants to know if he can get 300 mg for the rx.

## 2013-08-22 NOTE — Telephone Encounter (Signed)
I left a voice message with the below information.

## 2013-08-22 NOTE — Telephone Encounter (Signed)
He already has plenty of refills. He needs an OV with me to discuss his depression and determine his best treatment

## 2013-08-24 ENCOUNTER — Ambulatory Visit (INDEPENDENT_AMBULATORY_CARE_PROVIDER_SITE_OTHER): Payer: BC Managed Care – PPO | Admitting: Family Medicine

## 2013-08-24 ENCOUNTER — Encounter: Payer: Self-pay | Admitting: Family Medicine

## 2013-08-24 VITALS — BP 122/76 | HR 97 | Temp 99.0°F | Ht 71.5 in | Wt 227.0 lb

## 2013-08-24 DIAGNOSIS — F3289 Other specified depressive episodes: Secondary | ICD-10-CM

## 2013-08-24 DIAGNOSIS — F329 Major depressive disorder, single episode, unspecified: Secondary | ICD-10-CM

## 2013-08-24 MED ORDER — DIPHENHYDRAMINE HCL 25 MG PO TABS: 25.0000 mg | ORAL_TABLET | Freq: Every evening | ORAL | Status: DC | PRN

## 2013-08-24 MED ORDER — MELATONIN 5 MG PO TABS: 5.0000 mg | ORAL_TABLET | Freq: Every day | ORAL | Status: DC

## 2013-08-24 MED ORDER — CLONAZEPAM 1 MG PO TABS: 1.0000 mg | ORAL_TABLET | Freq: Two times a day (BID) | ORAL | Status: DC | PRN

## 2013-08-24 MED ORDER — DESVENLAFAXINE SUCCINATE ER 50 MG PO TB24: 50.0000 mg | ORAL_TABLET | Freq: Every day | ORAL | Status: DC

## 2013-08-24 NOTE — Progress Notes (Signed)
Pre visit review using our clinic review tool, if applicable. No additional management support is needed unless otherwise documented below in the visit note. 

## 2013-08-24 NOTE — Progress Notes (Signed)
   Subjective:    Patient ID: Joseph Tran, male    DOB: 05-29-1951, 63 y.o.   MRN: [id number]  HPI Here to discuss his depression. He has been taking Wellbutrin 150 mg daily for several years but his depression is getting worse. He feels sad all the time, has no energy, and his mind seems to get obsessed with negative thoughts for hours at a time. He admits to some suicidal thoughts at times, and he describes a scenario where he pictures himself sitting in his car with the engine running and the garage door shut. He has never tried to harm himself and states that he never will. He also feels anxious at times and often worries about everything. He has chronic insomnia but he has found that taking a combination of Clonazepam, melatonin, and diphenhydramine wokr fairly well for sleep. He goes to work everyday and this has not affected his job so far. He used to exercise at the gym but he has gotten away from this over the winter. He has tried other meds in the past with mixed results. Serzone helped him years ago but this was taken off the market. Effexor helped but caused weight gain. Prozac helped but caused a total loss of libido. He was afraid to try Lexapro because he read about a very slight risk of Stevens-Johnson syndrome associated with this, and one of his sisters actually had this in the past.    Review of Systems  Constitutional: Positive for fatigue.  Respiratory: Negative.   Cardiovascular: Negative.   Neurological: Negative.   Psychiatric/Behavioral: Positive for suicidal ideas, sleep disturbance, dysphoric mood and decreased concentration. Negative for hallucinations, behavioral problems, confusion, self-injury and agitation. The patient is nervous/anxious.        Objective:   Physical Exam  Constitutional: He is oriented to person, place, and time. He appears well-developed and well-nourished.  Neurological: He is alert and oriented to person, place, and time.  Psychiatric: His  behavior is normal. Judgment and thought content normal.  Flat affect but good eye contact           Assessment & Plan:  Depression with anxious features. We will try samples of Pristiq 50 mg daily for 2 weeks. He will follow up with me in 2 weeks. At that time we may consider increasing the Wellbutrin to 300 mg daily but not yet. He does have some therapy sessions with his minister, but he does not want to try formal psychotherapy. He met with a psychologist some years ago and he says this made him worse so he stopped.

## 2013-09-04 ENCOUNTER — Encounter: Payer: Self-pay | Admitting: Family Medicine

## 2013-09-04 ENCOUNTER — Ambulatory Visit (INDEPENDENT_AMBULATORY_CARE_PROVIDER_SITE_OTHER): Payer: BC Managed Care – PPO | Admitting: Family Medicine

## 2013-09-04 VITALS — BP 106/68 | HR 92 | Temp 98.4°F | Ht 71.5 in | Wt 225.0 lb

## 2013-09-04 DIAGNOSIS — F3289 Other specified depressive episodes: Secondary | ICD-10-CM

## 2013-09-04 DIAGNOSIS — F329 Major depressive disorder, single episode, unspecified: Secondary | ICD-10-CM

## 2013-09-04 MED ORDER — DESVENLAFAXINE SUCCINATE ER 50 MG PO TB24: 50.0000 mg | ORAL_TABLET | Freq: Every day | ORAL | Status: DC

## 2013-09-04 NOTE — Progress Notes (Signed)
Pre visit review using our clinic review tool, if applicable. No additional management support is needed unless otherwise documented below in the visit note. 

## 2013-09-04 NOTE — Progress Notes (Signed)
   Subjective:    Patient ID: Joseph Tran, male    DOB: 1951/01/25, 63 y.o.   MRN: [id number]  HPI Here to follow up on depression. He has been taking samples for Pristiq 50 mg daily along with Wellbutrin for the past month. This has helped with his moods swings and he feels better in general. No side effects.    Review of Systems  Constitutional: Negative.   Respiratory: Negative.        Objective:   Physical Exam  Constitutional: He is oriented to person, place, and time. He appears well-developed and well-nourished.  HENT:  Right Ear: External ear normal.  Nose: Nose normal.  Mouth/Throat: Oropharynx is clear and moist. No oropharyngeal exudate.  Neurological: He is alert and oriented to person, place, and time.  Psychiatric: He has a normal mood and affect. His behavior is normal. Thought content normal.          Assessment & Plan:  We will stay on the current regimen for now. Recheck in one month

## 2013-09-26 ENCOUNTER — Encounter: Payer: Self-pay | Admitting: Family Medicine

## 2013-10-15 ENCOUNTER — Encounter: Payer: Self-pay | Admitting: Family Medicine

## 2013-10-15 ENCOUNTER — Ambulatory Visit (INDEPENDENT_AMBULATORY_CARE_PROVIDER_SITE_OTHER): Payer: BC Managed Care – PPO | Admitting: Family Medicine

## 2013-10-15 VITALS — BP 130/72 | HR 96 | Temp 98.6°F | Ht 71.5 in | Wt 228.0 lb

## 2013-10-15 DIAGNOSIS — K602 Anal fissure, unspecified: Secondary | ICD-10-CM

## 2013-10-15 DIAGNOSIS — F329 Major depressive disorder, single episode, unspecified: Secondary | ICD-10-CM

## 2013-10-15 DIAGNOSIS — F3289 Other specified depressive episodes: Secondary | ICD-10-CM

## 2013-10-15 MED ORDER — BUPROPION HCL ER (XL) 300 MG PO TB24: 300.0000 mg | ORAL_TABLET | Freq: Every day | ORAL | Status: DC

## 2013-10-15 NOTE — Progress Notes (Signed)
   Subjective:    Patient ID: Joseph Tran, male    DOB: 02/18/1951, 63 y.o.   MRN: [id number]  HPI Here to follow up on depression. He has had improvement on Pristiq and Wellbutrin but he still has some days where he is very depressed and he "crashes". He sleeps well. He also mentions chronic constipation and occasional rectal pain with a BM. No bleeding. He uses ClearLax daily and drinks prune juice.    Review of Systems  Constitutional: Negative.   Gastrointestinal: Positive for constipation and rectal pain. Negative for abdominal pain, blood in stool and anal bleeding.  Psychiatric/Behavioral: Positive for dysphoric mood.       Objective:   Physical Exam  Constitutional: He appears well-developed and well-nourished.  Abdominal:  Several small partially healed anal fissures   Psychiatric: He has a normal mood and affect. His behavior is normal. Thought content normal.          Assessment & Plan:  Increase Wellbutrin to 300 mg daily. Increase ClearLax to BID.

## 2013-10-15 NOTE — Progress Notes (Signed)
Pre visit review using our clinic review tool, if applicable. No additional management support is needed unless otherwise documented below in the visit note. 

## 2013-12-08 ENCOUNTER — Other Ambulatory Visit: Payer: Self-pay | Admitting: Family Medicine

## 2014-03-25 ENCOUNTER — Ambulatory Visit (INDEPENDENT_AMBULATORY_CARE_PROVIDER_SITE_OTHER): Payer: BC Managed Care – PPO | Admitting: Family Medicine

## 2014-03-25 DIAGNOSIS — Z23 Encounter for immunization: Secondary | ICD-10-CM

## 2014-04-05 ENCOUNTER — Other Ambulatory Visit (INDEPENDENT_AMBULATORY_CARE_PROVIDER_SITE_OTHER): Payer: BC Managed Care – PPO

## 2014-04-05 DIAGNOSIS — Z0000 Encounter for general adult medical examination without abnormal findings: Secondary | ICD-10-CM

## 2014-04-05 LAB — HEPATIC FUNCTION PANEL
ALT: 22 U/L (ref 0–53)
AST: 21 U/L (ref 0–37)
Albumin: 4 g/dL (ref 3.5–5.2)
Alkaline Phosphatase: 59 U/L (ref 39–117)
BILIRUBIN TOTAL: 0.8 mg/dL (ref 0.2–1.2)
Bilirubin, Direct: 0.1 mg/dL (ref 0.0–0.3)
Total Protein: 6.6 g/dL (ref 6.0–8.3)

## 2014-04-05 LAB — POCT URINALYSIS DIPSTICK
Bilirubin, UA: NEGATIVE
Blood, UA: NEGATIVE
GLUCOSE UA: NEGATIVE
Ketones, UA: NEGATIVE
Nitrite, UA: NEGATIVE
PROTEIN UA: NEGATIVE
Spec Grav, UA: 1.02
UROBILINOGEN UA: 0.2
pH, UA: 5.5

## 2014-04-05 LAB — TSH: TSH: 2 u[IU]/mL (ref 0.35–4.50)

## 2014-04-05 LAB — CBC WITH DIFFERENTIAL/PLATELET
Basophils Absolute: 0 10*3/uL (ref 0.0–0.1)
Basophils Relative: 0.7 % (ref 0.0–3.0)
EOS ABS: 0.2 10*3/uL (ref 0.0–0.7)
EOS PCT: 2.9 % (ref 0.0–5.0)
HCT: 47.4 % (ref 39.0–52.0)
Hemoglobin: 15.9 g/dL (ref 13.0–17.0)
LYMPHS PCT: 21 % (ref 12.0–46.0)
Lymphs Abs: 1.3 10*3/uL (ref 0.7–4.0)
MCHC: 33.6 g/dL (ref 30.0–36.0)
MCV: 94.3 fl (ref 78.0–100.0)
MONO ABS: 0.4 10*3/uL (ref 0.1–1.0)
Monocytes Relative: 6.7 % (ref 3.0–12.0)
NEUTROS PCT: 68.7 % (ref 43.0–77.0)
Neutro Abs: 4.3 10*3/uL (ref 1.4–7.7)
PLATELETS: 233 10*3/uL (ref 150.0–400.0)
RBC: 5.03 Mil/uL (ref 4.22–5.81)
RDW: 13 % (ref 11.5–15.5)
WBC: 6.2 10*3/uL (ref 4.0–10.5)

## 2014-04-05 LAB — BASIC METABOLIC PANEL
BUN: 14 mg/dL (ref 6–23)
CO2: 26 meq/L (ref 19–32)
Calcium: 9.3 mg/dL (ref 8.4–10.5)
Chloride: 109 mEq/L (ref 96–112)
Creatinine, Ser: 1.2 mg/dL (ref 0.4–1.5)
GFR: 63.81 mL/min (ref >60.00)
Glucose, Bld: 83 mg/dL (ref 70–99)
POTASSIUM: 4 meq/L (ref 3.5–5.1)
SODIUM: 141 meq/L (ref 135–145)

## 2014-04-05 LAB — LIPID PANEL
CHOLESTEROL: 187 mg/dL (ref 0–200)
HDL: 46.4 mg/dL (ref >39.00)
LDL Cholesterol: 125 mg/dL — ABNORMAL HIGH (ref 0–99)
NonHDL: 140.6
TRIGLYCERIDES: 76 mg/dL (ref 0.0–149.0)
Total CHOL/HDL Ratio: 4
VLDL: 15.2 mg/dL (ref 0.0–40.0)

## 2014-04-05 LAB — PSA: PSA: 2.28 ng/mL (ref 0.10–4.00)

## 2014-04-17 ENCOUNTER — Ambulatory Visit (INDEPENDENT_AMBULATORY_CARE_PROVIDER_SITE_OTHER): Payer: BC Managed Care – PPO | Admitting: Family Medicine

## 2014-04-17 ENCOUNTER — Encounter: Payer: Self-pay | Admitting: Family Medicine

## 2014-04-17 VITALS — BP 114/69 | HR 75 | Temp 98.2°F | Ht 71.5 in | Wt 223.0 lb

## 2014-04-17 DIAGNOSIS — Z0000 Encounter for general adult medical examination without abnormal findings: Secondary | ICD-10-CM

## 2014-04-17 MED ORDER — CLONAZEPAM 1 MG PO TABS: 1.0000 mg | ORAL_TABLET | Freq: Two times a day (BID) | ORAL | Status: DC | PRN

## 2014-04-17 MED ORDER — SILDENAFIL CITRATE 100 MG PO TABS: 100.0000 mg | ORAL_TABLET | Freq: Every day | ORAL | Status: DC | PRN

## 2014-04-17 NOTE — Progress Notes (Signed)
Pre visit review using our clinic review tool, if applicable. No additional management support is needed unless otherwise documented below in the visit note. 

## 2014-04-17 NOTE — Progress Notes (Signed)
   Subjective:    Patient ID: Joseph Tran, male    DOB: 06/29/1951, 63 y.o.   MRN: [id number]  HPI 63 yr old male for a cpx. He feels well. His depression has responded well to the addition of Pristiq to his Wellbutrin.    Review of Systems  Constitutional: Negative.   HENT: Negative.   Eyes: Negative.   Respiratory: Negative.   Cardiovascular: Negative.   Gastrointestinal: Negative.   Genitourinary: Negative.   Musculoskeletal: Negative.   Skin: Negative.   Neurological: Negative.   Psychiatric/Behavioral: Negative.        Objective:   Physical Exam  Constitutional: He is oriented to person, place, and time. He appears well-developed and well-nourished. No distress.  HENT:  Head: Normocephalic and atraumatic.  Right Ear: External ear normal.  Left Ear: External ear normal.  Nose: Nose normal.  Mouth/Throat: Oropharynx is clear and moist. No oropharyngeal exudate.  Eyes: Conjunctivae and EOM are normal. Pupils are equal, round, and reactive to light. Right eye exhibits no discharge. Left eye exhibits no discharge. No scleral icterus.  Neck: Neck supple. No JVD present. No tracheal deviation present. No thyromegaly present.  Cardiovascular: Normal rate, regular rhythm, normal heart sounds and intact distal pulses.  Exam reveals no gallop and no friction rub.   No murmur heard. EKG normal   Pulmonary/Chest: Effort normal and breath sounds normal. No respiratory distress. He has no wheezes. He has no rales. He exhibits no tenderness.  Abdominal: Soft. Bowel sounds are normal. He exhibits no distension and no mass. There is no tenderness. There is no rebound and no guarding.  Genitourinary: Rectum normal, prostate normal and penis normal. Guaiac negative stool. No penile tenderness.  Musculoskeletal: Normal range of motion. He exhibits no edema and no tenderness.  Lymphadenopathy:    He has no cervical adenopathy.  Neurological: He is alert and oriented to person, place, and  time. He has normal reflexes. No cranial nerve deficit. He exhibits normal muscle tone. Coordination normal.  Skin: Skin is warm and dry. No rash noted. He is not diaphoretic. No erythema. No pallor.  Psychiatric: He has a normal mood and affect. His behavior is normal. Judgment and thought content normal.          Assessment & Plan:  Well exam.

## 2014-09-03 ENCOUNTER — Encounter: Payer: Self-pay | Admitting: Family Medicine

## 2014-09-03 ENCOUNTER — Ambulatory Visit (INDEPENDENT_AMBULATORY_CARE_PROVIDER_SITE_OTHER): Payer: BLUE CROSS/BLUE SHIELD | Admitting: Family Medicine

## 2014-09-03 VITALS — BP 126/76 | HR 89 | Temp 98.6°F | Ht 71.5 in | Wt 230.0 lb

## 2014-09-03 DIAGNOSIS — M7700 Medial epicondylitis, unspecified elbow: Secondary | ICD-10-CM

## 2014-09-03 DIAGNOSIS — M7710 Lateral epicondylitis, unspecified elbow: Secondary | ICD-10-CM

## 2014-09-03 DIAGNOSIS — M159 Polyosteoarthritis, unspecified: Secondary | ICD-10-CM

## 2014-09-03 DIAGNOSIS — M150 Primary generalized (osteo)arthritis: Secondary | ICD-10-CM

## 2014-09-03 MED ORDER — DICLOFENAC SODIUM 75 MG PO TBEC: 75.0000 mg | DELAYED_RELEASE_TABLET | Freq: Two times a day (BID) | ORAL | Status: DC

## 2014-09-03 NOTE — Progress Notes (Signed)
   Subjective:    Patient ID: Joseph Tran, male    DOB: 1950/08/27, 64 y.o.   MRN: [id number]  HPI Here for stiffness and pain in both elbows and both knees. No swelling or redness. No skin rashes. He gets some relief with ibuprofen. 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    Review of Systems  Constitutional: Negative.   Musculoskeletal: Positive for arthralgias. Negative for back pain, joint swelling and neck pain.  Skin: Negative.        Objective:   Physical Exam  Constitutional: He appears well-developed and well-nourished.  Musculoskeletal:  His tender over the medial and lateral epicondyles of both elbows, no swelling or warmth or erythema. Full ROM. The knees show full ROM with no tenderness.           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 Try Diclofenac 75 mg bid. Recheck prn

## 2014-09-03 NOTE — Progress Notes (Signed)
Pre visit review using our clinic review tool, if applicable. No additional management support is needed unless otherwise documented below in the visit note. 

## 2014-10-11 ENCOUNTER — Other Ambulatory Visit: Payer: Self-pay | Admitting: Family Medicine

## 2014-10-15 ENCOUNTER — Encounter: Payer: Self-pay | Admitting: Family Medicine

## 2014-10-15 ENCOUNTER — Ambulatory Visit (INDEPENDENT_AMBULATORY_CARE_PROVIDER_SITE_OTHER): Payer: BLUE CROSS/BLUE SHIELD | Admitting: Family Medicine

## 2014-10-15 ENCOUNTER — Ambulatory Visit (HOSPITAL_COMMUNITY)
Admission: RE | Admit: 2014-10-15 | Discharge: 2014-10-15 | Disposition: A | Discharge status: Home / Self Care | Payer: BLUE CROSS/BLUE SHIELD | Source: Ambulatory Visit | Attending: Family Medicine | Admitting: Family Medicine

## 2014-10-15 VITALS — BP 122/73 | HR 80 | Temp 98.8°F | Ht 71.5 in | Wt 236.0 lb

## 2014-10-15 DIAGNOSIS — Z86718 Personal history of other venous thrombosis and embolism: Secondary | ICD-10-CM | POA: Diagnosis not present

## 2014-10-15 DIAGNOSIS — M7989 Other specified soft tissue disorders: Secondary | ICD-10-CM | POA: Insufficient documentation

## 2014-10-15 DIAGNOSIS — R938 Abnormal findings on diagnostic imaging of other specified body structures: Secondary | ICD-10-CM | POA: Diagnosis not present

## 2014-10-15 MED ORDER — RIVAROXABAN 15 MG PO TABS: 15.0000 mg | ORAL_TABLET | Freq: Two times a day (BID) | ORAL | Status: DC

## 2014-10-15 NOTE — Progress Notes (Signed)
VASCULAR LAB PRELIMINARY  PRELIMINARY  PRELIMINARY  PRELIMINARY  Bilateral lower extremity venous duplex  completed.    Preliminary report:  Right:  DVT noted from the proximal femoral vein through the popliteal, gastrocnemius, posterior tibial, and peroneal veins.  The common femoral vein is patent.  No evidence of superficial thrombosis.  No Baker's cyst.  Left:  No evidence of DVT, superficial thrombosis, or Baker's cyst.  Montie Swiderski, RVT 10/15/2014, 6:54 PM

## 2014-10-15 NOTE — Progress Notes (Signed)
   Subjective:    Patient ID: Joseph Tran, male    DOB: [birth year], 64 y.o.   MRN: [id number]  HPI Here for the sudden onset of pain and swelling in the right leg that started 3 days ago. No recent trauma. No periods of prolonged sitting. Most of the pain is in the calf but he also has some pain in the posterior thigh. The leg has been swelling form the mid thigh down. No chest pain or SOB. No hx of blood clots. His father had some blood clots apparently. He has been on testosterone replacement for about about 3 years.   Review of Systems  Constitutional: Negative.   Respiratory: Negative.   Cardiovascular: Positive for leg swelling. Negative for chest pain and palpitations.       Objective:   Physical Exam  Constitutional: He appears well-developed and well-nourished.  Walks with a mild limp  Cardiovascular: Normal rate, regular rhythm, normal heart sounds and intact distal pulses.  Exam reveals no gallop and no friction rub.   No murmur heard. Pulmonary/Chest: Effort normal. No respiratory distress. He has no wheezes. He has no rales.  Musculoskeletal:  The right leg is tense with 3+ edema from the mid thigh down to the foot. No warmth or erythema. He is tender in the calf, Homans is positive. No cords are felt.           Assessment & Plan:  This is almost certainly a DVT so we will set him up for a stat venous doppler. I asked him to stop taking aspirin and fish oil and Diclofenac, as well as all OTC NDSAIDs. We gave him samples to start on Xarelto 15 mg bid for 7 days, beginning tonight. He will stop all testosterone supplements for the time being. He knows to go straight to the ER if he experiences any chest pains or SOB.

## 2014-10-15 NOTE — Progress Notes (Signed)
Pre visit review using our clinic review tool, if applicable. No additional management support is needed unless otherwise documented below in the visit note. 

## 2014-10-17 ENCOUNTER — Telehealth: Payer: Self-pay | Admitting: Family Medicine

## 2014-10-17 NOTE — Telephone Encounter (Signed)
Pt was seen on 4-12 and was dx with blood clot. Pt would like to know what should he do concerning med etc. Pt would like sylvia to call him back

## 2014-10-17 NOTE — Telephone Encounter (Signed)
I spoke with pt and went over results. 

## 2014-10-18 ENCOUNTER — Encounter: Payer: Self-pay | Admitting: Family Medicine

## 2014-10-18 ENCOUNTER — Ambulatory Visit (INDEPENDENT_AMBULATORY_CARE_PROVIDER_SITE_OTHER): Payer: BLUE CROSS/BLUE SHIELD | Admitting: Family Medicine

## 2014-10-18 VITALS — BP 121/71 | HR 83 | Temp 99.3°F | Ht 71.5 in | Wt 236.0 lb

## 2014-10-18 DIAGNOSIS — I82409 Acute embolism and thrombosis of unspecified deep veins of unspecified lower extremity: Secondary | ICD-10-CM | POA: Insufficient documentation

## 2014-10-18 DIAGNOSIS — I82401 Acute embolism and thrombosis of unspecified deep veins of right lower extremity: Secondary | ICD-10-CM

## 2014-10-18 MED ORDER — RIVAROXABAN 20 MG PO TABS: 20.0000 mg | ORAL_TABLET | Freq: Every day | ORAL | Status: DC

## 2014-10-18 MED ORDER — ALBUTEROL SULFATE HFA 108 (90 BASE) MCG/ACT IN AERS: 2.0000 | INHALATION_SPRAY | RESPIRATORY_TRACT | Status: DC | PRN

## 2014-10-18 NOTE — Progress Notes (Signed)
Pre visit review using our clinic review tool, if applicable. No additional management support is needed unless otherwise documented below in the visit note. 

## 2014-10-18 NOTE — Progress Notes (Signed)
   Subjective:    Patient ID: Joseph Tran, male    DOB: 25-Aug-1950, 64 y.o.   MRN: [id number]  HPI Here to follow up a DVT in the right leg. He was seen several days ago for swelling and pain in the right leg. A doppler revealed a large thrombus. He has been taking samples of Xarelto 15 mg bid. He feels a little better with less swelling and less pain in the leg. Still no SOB or chest pain.    Review of Systems  Constitutional: Negative.   Respiratory: Negative.   Cardiovascular: Positive for leg swelling. Negative for chest pain and palpitations.       Objective:   Physical Exam  Constitutional: He appears well-developed and well-nourished.  Cardiovascular: Normal rate, regular rhythm, normal heart sounds and intact distal pulses.   Pulmonary/Chest: Effort normal and breath sounds normal.  Musculoskeletal:  The right thigh and calf are still swollen but less so, he is tender in the calf but less so           Assessment & Plan:  DVT. He will finish up 7 days of the Xarelto at 15 mg bid, then he will switch to 20 mg daily. He will take it easy this weekend. 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 We will refer him to Hematology for a hypercoagulability evaluation.

## 2014-11-05 ENCOUNTER — Other Ambulatory Visit (HOSPITAL_BASED_OUTPATIENT_CLINIC_OR_DEPARTMENT_OTHER): Payer: BLUE CROSS/BLUE SHIELD

## 2014-11-05 ENCOUNTER — Ambulatory Visit (HOSPITAL_BASED_OUTPATIENT_CLINIC_OR_DEPARTMENT_OTHER): Payer: BLUE CROSS/BLUE SHIELD | Admitting: Family

## 2014-11-05 ENCOUNTER — Ambulatory Visit: Payer: BLUE CROSS/BLUE SHIELD

## 2014-11-05 ENCOUNTER — Encounter: Payer: Self-pay | Admitting: Family

## 2014-11-05 VITALS — BP 129/75 | HR 64 | Temp 97.5°F | Resp 18 | Ht 72.0 in | Wt 236.0 lb

## 2014-11-05 DIAGNOSIS — I82401 Acute embolism and thrombosis of unspecified deep veins of right lower extremity: Secondary | ICD-10-CM

## 2014-11-05 DIAGNOSIS — I82441 Acute embolism and thrombosis of right tibial vein: Secondary | ICD-10-CM

## 2014-11-05 DIAGNOSIS — I82411 Acute embolism and thrombosis of right femoral vein: Secondary | ICD-10-CM

## 2014-11-05 DIAGNOSIS — I82431 Acute embolism and thrombosis of right popliteal vein: Secondary | ICD-10-CM

## 2014-11-05 DIAGNOSIS — Z7901 Long term (current) use of anticoagulants: Secondary | ICD-10-CM

## 2014-11-05 LAB — COMPREHENSIVE METABOLIC PANEL
ALT: 21 U/L (ref 0–53)
AST: 23 U/L (ref 0–37)
Albumin: 4 g/dL (ref 3.5–5.2)
Alkaline Phosphatase: 77 U/L (ref 39–117)
BUN: 14 mg/dL (ref 6–23)
CALCIUM: 9.4 mg/dL (ref 8.4–10.5)
CHLORIDE: 107 meq/L (ref 96–112)
CO2: 23 mEq/L (ref 19–32)
Creatinine, Ser: 1.19 mg/dL (ref 0.50–1.35)
Glucose, Bld: 87 mg/dL (ref 70–99)
Potassium: 4.6 mEq/L (ref 3.5–5.3)
Sodium: 140 mEq/L (ref 135–145)
TOTAL PROTEIN: 6.6 g/dL (ref 6.0–8.3)
Total Bilirubin: 0.7 mg/dL (ref 0.2–1.2)

## 2014-11-05 LAB — CBC WITH DIFFERENTIAL (CANCER CENTER ONLY)
BASO#: 0.1 10*3/uL (ref 0.0–0.2)
BASO%: 0.9 % (ref 0.0–2.0)
EOS ABS: 0.2 10*3/uL (ref 0.0–0.5)
EOS%: 3.5 % (ref 0.0–7.0)
HCT: 44.8 % (ref 38.7–49.9)
HGB: 15.3 g/dL (ref 13.0–17.1)
LYMPH#: 1.2 10*3/uL (ref 0.9–3.3)
LYMPH%: 18.7 % (ref 14.0–48.0)
MCH: 31.4 pg (ref 28.0–33.4)
MCHC: 34.2 g/dL (ref 32.0–35.9)
MCV: 92 fL (ref 82–98)
MONO#: 0.5 10*3/uL (ref 0.1–0.9)
MONO%: 7.2 % (ref 0.0–13.0)
NEUT#: 4.6 10*3/uL (ref 1.5–6.5)
NEUT%: 69.7 % (ref 40.0–80.0)
PLATELETS: 227 10*3/uL (ref 145–400)
RBC: 4.88 10*6/uL (ref 4.20–5.70)
RDW: 12.1 % (ref 11.1–15.7)
WBC: 6.6 10*3/uL (ref 4.0–10.0)

## 2014-11-05 NOTE — Progress Notes (Signed)
Hematology/Oncology Consultation   Name: Joseph Tran      MRN: [id number]    Location: Room/bed info not found  Date: 11/05/2014 Time:10:56 AM   REFERRING PHYSICIAN:  Annie Main A. Sarajane Jews, MD  REASON FOR CONSULT: DVT of the right lower extremity    DIAGNOSIS: DVT of right lower extremity involving the right femoral, right popliteal, right posterial tibial, right peroneal and right gastrocnemius veins.  HISTORY OF PRESENT ILLNESS: Joseph Tran is a very pleasant 64 yo white male with a recent diagnosis of right lower extremity DVT involving the right femoral, right popliteal, right posterial tibial, right peroneal and right gastrocnemius veins. He is currently on Xarelto and about to finish the 15 mg BID and begin 20 mg once daily. He has some swelling around his right ankle but pedal pulse is +1. He also has some tenderness in his calf that comes and goes. He is wearing a compression stocking daily.  He had been receiving Testosterone injections for over a year and stopped these in April once he was diagnosed with his DVT.  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 Joseph Tran told him to wait a month and then he would be able to start again.  This is his first blood clot. His grandfather had a history of blood clots in his later years.  He has no personal history of cancer. His grandmother has liver cancer, aunt and niece both had cervical cancer at an early age.   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 This also causes him to get dizzy if he stands or turns his head quickly.  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  He denies fever, chills, n/v, cough ,rash, headaches, blurred vision, chest pain, palpitations, abdominal pain, constipation, diarrhea, blood in urine or stool. He has asthma and occassionally becomes SOB due to this.  No numbness or tingling in his extremities.  He has a good appetite and  is staying hydrated. I emphasized the importance that he drink plenty of fluids. 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 I will give him a prescription for 2 more compression stockings.   ROS: All other 10 point review of systems is negative.   PAST MEDICAL HISTORY:   Past Medical History  Diagnosis Date  . Asthma   . Depression   . Colon polyps   . Sleep apnea     hx  . Acoustic neuroma     benign  . Plantar fasciitis     rt foot  . Hypogonadism male     sees Joseph Tran     ALLERGIES: Allergies  Allergen Reactions  . Allopurinol   . Penicillins   . Sulfonamide Derivatives       MEDICATIONS:  Current Outpatient Prescriptions on File Prior to Visit  Medication Sig Dispense Refill  . albuterol (VENTOLIN HFA) 108 (90 BASE) MCG/ACT inhaler Inhale 2 puffs into the lungs every 4 (four) hours as needed for wheezing or shortness of breath. 3 Inhaler 3  . buPROPion (WELLBUTRIN XL) 300 MG 24 hr tablet TAKE 1 TABLET (300 MG TOTAL) BY MOUTH DAILY. 90 tablet 3  . clonazePAM (KLONOPIN) 1 MG tablet Take 1 tablet (1 mg total) by mouth 2 (two) times daily as needed for anxiety. 180 tablet 1  . Melatonin 5 MG TABS Take 1 mg by mouth at bedtime.    . Multiple  Vitamin (MULTIVITAMIN) tablet Take 1 tablet by mouth daily. For men 50+    . PRISTIQ 50 MG 24 hr tablet TAKE 1 TABLET BY MOUTH EVERY DAY 90 tablet 3  . Rivaroxaban (XARELTO) 15 MG TABS tablet Take 1 tablet (15 mg total) by mouth 2 (two) times daily with a meal. 14 tablet 0  . rivaroxaban (XARELTO) 20 MG TABS tablet Take 1 tablet (20 mg total) by mouth daily with supper. 30 tablet 11  . sildenafil (VIAGRA) 100 MG tablet Take 1 tablet (100 mg total) by mouth daily as needed. (Patient not taking: Reported on 10/18/2014) 10 tablet 11  . tamsulosin (FLOMAX) 0.4 MG CAPS Take 0.4 mg by mouth daily.     No current facility-administered medications on file prior to visit.     PAST SURGICAL HISTORY Past Surgical History  Procedure  Laterality Date  . Right knee arthroscopy  2008  . Tonsillectomy    . Chest wall tumor excision  2007  . Madiscus cartilage  2009  . Kidney caluculs  2004-05  . Colonoscopy  April 2011    in [place], clear, repeat in 5 yrs   . Knee arthroscopy  09-10-11    right knee, per Dr. Dorna Tran     FAMILY HISTORY: Family History  Problem Relation Age of Onset  . Heart disease      parents  . Hyperlipidemia    . Stroke      grandparents  . Sudden death      uncle less than 20 yrs old    SOCIAL HISTORY:  reports that he has never smoked. He has never used smokeless tobacco. He reports that he does not drink alcohol or use illicit drugs.  PERFORMANCE STATUS: The patient's performance status is 1 - Symptomatic but completely ambulatory  PHYSICAL EXAM: Most Recent Vital Signs: There were no vitals taken for this visit. BP 129/75 mmHg  Pulse 64  Temp(Src) 97.5 F (36.4 C) (Oral)  Resp 18  Ht 6' (1.829 m)  Wt 236 lb (107.049 kg)  BMI 32.00 kg/m2  General Appearance:    Alert, cooperative, no distress, appears stated age  Head:    Normocephalic, without obvious abnormality, atraumatic  Eyes:    PERRL, conjunctiva/corneas clear, EOM's intact, fundi    benign, both eyes             Throat:   Lips, mucosa, and tongue normal; teeth and gums normal  Neck:   Supple, symmetrical, trachea midline, no adenopathy;       thyroid:  No enlargement/tenderness/nodules; no carotid   bruit or JVD  Back:     Symmetric, no curvature, ROM normal, no CVA tenderness  Lungs:     Clear to auscultation bilaterally, respirations unlabored  Chest wall:    No tenderness or deformity  Heart:    Regular rate and rhythm, S1 and S2 normal, no murmur, rub   or gallop  Abdomen:     Soft, non-tender, bowel sounds active all four quadrants,    no masses, no organomegaly        Extremities:   Extremities normal, atraumatic, no cyanosis, edema in right ankle  Pulses:   2+ and symmetric all extremities  Skin:    Skin color, texture, turgor normal, no rashes or lesions  Lymph nodes:   Cervical, supraclavicular, and axillary nodes normal  Neurologic:   CNII-XII intact. Normal strength, sensation and reflexes      throughout   LABORATORY DATA:  Results for  orders placed or performed in visit on 11/05/14 (from the past 48 hour(s))  CBC with Differential [facility])     Status: None   Collection Time: 11/05/14 10:25 AM  Result Value Ref Range   WBC 6.6 4.0 - 10.0 10e3/uL   RBC 4.88 4.20 - 5.70 10e6/uL   HGB 15.3 13.0 - 17.1 g/dL   HCT 44.8 38.7 - 49.9 %   MCV 92 82 - 98 fL   MCH 31.4 28.0 - 33.4 pg   MCHC 34.2 32.0 - 35.9 g/dL   RDW 12.1 11.1 - 15.7 %   Platelets 227 145 - 400 10e3/uL   NEUT# 4.6 1.5 - 6.5 10e3/uL   LYMPH# 1.2 0.9 - 3.3 10e3/uL   MONO# 0.5 0.1 - 0.9 10e3/uL   Eosinophils Absolute 0.2 0.0 - 0.5 10e3/uL   BASO# 0.1 0.0 - 0.2 10e3/uL   NEUT% 69.7 40.0 - 80.0 %   LYMPH% 18.7 14.0 - 48.0 %   MONO% 7.2 0.0 - 13.0 %   EOS% 3.5 0.0 - 7.0 %   BASO% 0.9 0.0 - 2.0 %      RADIOGRAPHY: No results found.     PATHOLOGY: None  ASSESSMENT/PLAN: Mr. Sermeno is a very pleasant 64 yo white male recently diagnosed with a right lower extremity DVT involving the right femoral, right popliteal, right posterial tibial, right peroneal and right gastrocnemius veins. This is his first occurrence. He has some slight swelling around his right ankle and some occasional pain in his calf.  He will continue on Xarelto for 6 months to a year.  We will repeat a doppler study of his leg in 3 months.  We will see him back in 6 weeks for a follow-up.  He has his prescription for 2 compression stockings. All questions were answered. He knows to call the clinic with any problems, questions or concerns. We can certainly see him much sooner if necessary.  The patient was discussed with and also seen by Joseph Tran and he is in agreement with the aforementioned.   [facility]    Addendum:   I saw  and examined the patient with Etrulia Zarr. It is possible that the testosterone injections might have then a risk factor for his thromboembolic disease. 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 I don't see a problem with him riding his course. I think a compression stocking would help him out.    he will need at least 6 months of anticoagulation. It sounds like he had a fairly large thrombus. He might need I years worth of anticoagulation therapy.   We see him back, we will consider a Doppler test on him. I probably will do a Doppler test in 3 months. I want to see him back in 6 weeks.  Laurey Arrow

## 2014-11-08 ENCOUNTER — Other Ambulatory Visit: Payer: Self-pay | Admitting: *Deleted

## 2014-11-08 DIAGNOSIS — I82401 Acute embolism and thrombosis of unspecified deep veins of right lower extremity: Secondary | ICD-10-CM

## 2014-11-08 MED ORDER — RIVAROXABAN 20 MG PO TABS: 20.0000 mg | ORAL_TABLET | Freq: Every day | ORAL | Status: DC

## 2014-11-25 ENCOUNTER — Telehealth: Payer: Self-pay | Admitting: *Deleted

## 2014-11-25 NOTE — Telephone Encounter (Signed)
Patient is going on a road trip soon and wants to know if this is okay with Dr Marin Olp. Each way will be approximately 5 hours in length. Spoke with Dr Marin Olp who is okay with the travel, with the following recommendations. 1.) Wear compression stockings 2.) Drink plenty of water 3.) Get out of the car and walk around every 2 hours. Patient is aware of recomendations and is agreeable with plan.

## 2014-11-28 ENCOUNTER — Other Ambulatory Visit: Payer: Self-pay | Admitting: Family Medicine

## 2014-12-20 ENCOUNTER — Ambulatory Visit: Payer: BLUE CROSS/BLUE SHIELD | Admitting: Hematology & Oncology

## 2014-12-20 ENCOUNTER — Other Ambulatory Visit: Payer: BLUE CROSS/BLUE SHIELD

## 2014-12-20 ENCOUNTER — Other Ambulatory Visit: Payer: Self-pay | Admitting: Nurse Practitioner

## 2014-12-20 DIAGNOSIS — I82409 Acute embolism and thrombosis of unspecified deep veins of unspecified lower extremity: Secondary | ICD-10-CM

## 2014-12-23 ENCOUNTER — Other Ambulatory Visit (HOSPITAL_BASED_OUTPATIENT_CLINIC_OR_DEPARTMENT_OTHER): Payer: BLUE CROSS/BLUE SHIELD

## 2014-12-23 ENCOUNTER — Ambulatory Visit (HOSPITAL_BASED_OUTPATIENT_CLINIC_OR_DEPARTMENT_OTHER): Payer: BLUE CROSS/BLUE SHIELD | Admitting: Hematology & Oncology

## 2014-12-23 VITALS — BP 131/79 | HR 75 | Temp 98.4°F | Resp 20 | Wt 237.8 lb

## 2014-12-23 DIAGNOSIS — I82409 Acute embolism and thrombosis of unspecified deep veins of unspecified lower extremity: Secondary | ICD-10-CM

## 2014-12-23 DIAGNOSIS — I82819 Embolism and thrombosis of superficial veins of unspecified lower extremities: Secondary | ICD-10-CM | POA: Diagnosis not present

## 2014-12-23 DIAGNOSIS — I82401 Acute embolism and thrombosis of unspecified deep veins of right lower extremity: Secondary | ICD-10-CM

## 2014-12-23 LAB — CBC WITH DIFFERENTIAL (CANCER CENTER ONLY)
BASO#: 0.1 10*3/uL (ref 0.0–0.2)
BASO%: 1.1 % (ref 0.0–2.0)
EOS ABS: 0.2 10*3/uL (ref 0.0–0.5)
EOS%: 3.4 % (ref 0.0–7.0)
HCT: 42.5 % (ref 38.7–49.9)
HGB: 14.6 g/dL (ref 13.0–17.1)
LYMPH#: 1.1 10*3/uL (ref 0.9–3.3)
LYMPH%: 19.7 % (ref 14.0–48.0)
MCH: 31.9 pg (ref 28.0–33.4)
MCHC: 34.4 g/dL (ref 32.0–35.9)
MCV: 93 fL (ref 82–98)
MONO#: 0.4 10*3/uL (ref 0.1–0.9)
MONO%: 6.8 % (ref 0.0–13.0)
NEUT%: 69 % (ref 40.0–80.0)
NEUTROS ABS: 3.7 10*3/uL (ref 1.5–6.5)
PLATELETS: 181 10*3/uL (ref 145–400)
RBC: 4.58 10*6/uL (ref 4.20–5.70)
RDW: 12.4 % (ref 11.1–15.7)
WBC: 5.3 10*3/uL (ref 4.0–10.0)

## 2014-12-23 LAB — COMPREHENSIVE METABOLIC PANEL
ALK PHOS: 71 U/L (ref 39–117)
ALT: 35 U/L (ref 0–53)
AST: 29 U/L (ref 0–37)
Albumin: 4.3 g/dL (ref 3.5–5.2)
BILIRUBIN TOTAL: 0.8 mg/dL (ref 0.2–1.2)
BUN: 18 mg/dL (ref 6–23)
CALCIUM: 9.6 mg/dL (ref 8.4–10.5)
CO2: 20 mEq/L (ref 19–32)
CREATININE: 1.15 mg/dL (ref 0.50–1.35)
Chloride: 108 mEq/L (ref 96–112)
Glucose, Bld: 89 mg/dL (ref 70–99)
Potassium: 3.7 mEq/L (ref 3.5–5.3)
Sodium: 141 mEq/L (ref 135–145)
Total Protein: 6.8 g/dL (ref 6.0–8.3)

## 2014-12-23 NOTE — Progress Notes (Signed)
Hematology and Oncology Follow Up Visit  Joseph Tran [id number] [birth year] 64 y.o. 12/23/2014   Principle Diagnosis:   Thromboembolic disease of the right leg  Current Therapy:    Xarelto 20 mg by mouth daily-6-1 year of therapy required     Interim History:  Joseph Tran is back for follow-up. We first saw him in May. He had developed a thrombus in his right leg in April. In a Doppler that showed this. The thrombus was involving the right femoral down to the right gastrocnemius vein.  He has a compression stocking on his right leg. He is on Xarelto.  He is retired. He does train horses. He does not ride horses right now.  He says that his right leg does not get as swollen.  He had been on testosterone supplement shots. He is stop these.  He's not noted any bleeding. There's been no change in bowel or bladder habits. He says he has had some urinary issues which are chronic because of a enlarged prostate.  He's had no cough. His been no chest wall pain. He's had no rashes.  Overall, his performance status is ECOG 1.  Medications:  Current outpatient prescriptions:  .  albuterol (VENTOLIN HFA) 108 (90 BASE) MCG/ACT inhaler, Inhale 2 puffs into the lungs every 4 (four) hours as needed for wheezing or shortness of breath., Disp: 3 Inhaler, Rfl: 3 .  buPROPion (WELLBUTRIN XL) 300 MG 24 hr tablet, TAKE 1 TABLET (300 MG TOTAL) BY MOUTH DAILY., Disp: 90 tablet, Rfl: 3 .  clonazePAM (KLONOPIN) 1 MG tablet, Take 1 tablet (1 mg total) by mouth 2 (two) times daily as needed for anxiety., Disp: 180 tablet, Rfl: 1 .  co-enzyme Q-10 30 MG capsule, Take 100 mg by mouth daily., Disp: , Rfl:  .  diclofenac (VOLTAREN) 75 MG EC tablet, 75 mg 2 (two) times daily. , Disp: , Rfl: 3 .  docusate sodium (COLACE) 100 MG capsule, Take 100 mg by mouth at bedtime as needed for mild constipation., Disp: , Rfl:  .  Melatonin 5 MG TABS, Take 1 mg by mouth at bedtime., Disp: , Rfl:  .  Multiple Vitamin  (MULTIVITAMIN) tablet, Take 1 tablet by mouth daily. For men 50+, Disp: , Rfl:  .  OVER THE COUNTER MEDICATION, Take 750 mg by mouth every morning. " GABA ", Disp: , Rfl:  .  PRISTIQ 50 MG 24 hr tablet, TAKE 1 TABLET BY MOUTH EVERY DAY, Disp: 90 tablet, Rfl: 3 .  Rivaroxaban (XARELTO) 15 MG TABS tablet, Take 15 mg by mouth daily., Disp: , Rfl:  .  sildenafil (VIAGRA) 100 MG tablet, Take 1 tablet (100 mg total) by mouth daily as needed., Disp: 10 tablet, Rfl: 11 .  tamsulosin (FLOMAX) 0.4 MG CAPS, Take 0.4 mg by mouth daily., Disp: , Rfl:   Allergies:  Allergies  Allergen Reactions  . Allopurinol   . Penicillins   . Sulfonamide Derivatives     Past Medical History, Surgical history, Social history, and Family History were reviewed and updated.  Review of Systems: As above  Physical Exam:  weight is 237 lb 12 oz (107.843 kg). His oral temperature is 98.4 F (36.9 C). His blood pressure is 131/79 and his pulse is 75. His respiration is 20.   Wt Readings from Last 3 Encounters:  12/23/14 237 lb 12 oz (107.843 kg)  11/05/14 236 lb (107.049 kg)  10/18/14 236 lb (107.049 kg)     Well-developed and well-nourished white gentleman  no obvious distress. Head and neck exam shows no ocular or oral lesions. He has no palpable cervical or supraclavicular lymph nodes. Lungs are clear. Cardiac exam regular rate and rhythm with no murmurs, rubs or bruits. Abdomen is soft. He has good bowel sounds. He is moderately obese. She has no fluid wave. There is no palpable liver or spleen tip. Back exam shows no tenderness over the spine, ribs or hips. Extremities shows some mild nonpitting edema of the right leg. He has a compression stocking on the right leg. No these cord is noted in the right lower leg. He has a negative Homans sign. Left leg is unremarkable. He has good pulses bilaterally in his distal extremities. Skin exam shows no rashes, ecchymoses or petechia. Neurological exam shows no focal  neurological deficits.  Lab Results  Component Value Date   WBC 5.3 12/23/2014   HGB 14.6 12/23/2014   HCT 42.5 12/23/2014   MCV 93 12/23/2014   PLT 181 12/23/2014     Chemistry      Component Value Date/Time   NA 140 11/05/2014 1025   K 4.6 11/05/2014 1025   CL 107 11/05/2014 1025   CO2 23 11/05/2014 1025   BUN 14 11/05/2014 1025   CREATININE 1.19 11/05/2014 1025      Component Value Date/Time   CALCIUM 9.4 11/05/2014 1025   ALKPHOS 77 11/05/2014 1025   AST 23 11/05/2014 1025   ALT 21 11/05/2014 1025   BILITOT 0.7 11/05/2014 1025         Impression and Plan: Joseph Tran is 64 year old gentleman. He has a thrombus in the right leg. I had to believe that this is secondary to him being on testosterone supplementation. Is no family history of thromboembolic disease.  He had a thrombus in April. I do plan on at least 6 months of therapy.  I want to see him back in August. I want to get a Doppler 1 week see him back. I want to see how the thrombus is responding.  I spent about 35 minutes with him today. I answered all of his questions.     Volanda Napoleon, MD 6/20/20162:40 PM

## 2015-02-07 ENCOUNTER — Other Ambulatory Visit (HOSPITAL_BASED_OUTPATIENT_CLINIC_OR_DEPARTMENT_OTHER): Payer: BLUE CROSS/BLUE SHIELD

## 2015-02-13 ENCOUNTER — Other Ambulatory Visit: Payer: BLUE CROSS/BLUE SHIELD

## 2015-02-13 ENCOUNTER — Ambulatory Visit: Payer: BLUE CROSS/BLUE SHIELD | Admitting: Hematology & Oncology

## 2015-02-20 ENCOUNTER — Ambulatory Visit (HOSPITAL_BASED_OUTPATIENT_CLINIC_OR_DEPARTMENT_OTHER)
Admission: RE | Admit: 2015-02-20 | Discharge: 2015-02-20 | Disposition: A | Discharge status: Home / Self Care | Payer: BLUE CROSS/BLUE SHIELD | Source: Ambulatory Visit | Attending: Family | Admitting: Family

## 2015-02-20 DIAGNOSIS — I82401 Acute embolism and thrombosis of unspecified deep veins of right lower extremity: Secondary | ICD-10-CM

## 2015-02-20 DIAGNOSIS — I82591 Chronic embolism and thrombosis of other specified deep vein of right lower extremity: Secondary | ICD-10-CM | POA: Insufficient documentation

## 2015-02-21 ENCOUNTER — Encounter: Payer: Self-pay | Admitting: Hematology & Oncology

## 2015-02-21 ENCOUNTER — Ambulatory Visit (HOSPITAL_BASED_OUTPATIENT_CLINIC_OR_DEPARTMENT_OTHER): Payer: BLUE CROSS/BLUE SHIELD | Admitting: Hematology & Oncology

## 2015-02-21 ENCOUNTER — Other Ambulatory Visit (HOSPITAL_BASED_OUTPATIENT_CLINIC_OR_DEPARTMENT_OTHER): Payer: BLUE CROSS/BLUE SHIELD

## 2015-02-21 VITALS — BP 129/77 | HR 71 | Temp 98.0°F | Resp 18 | Ht 72.0 in | Wt 241.0 lb

## 2015-02-21 DIAGNOSIS — I82409 Acute embolism and thrombosis of unspecified deep veins of unspecified lower extremity: Secondary | ICD-10-CM

## 2015-02-21 DIAGNOSIS — I82401 Acute embolism and thrombosis of unspecified deep veins of right lower extremity: Secondary | ICD-10-CM

## 2015-02-21 DIAGNOSIS — Z7901 Long term (current) use of anticoagulants: Secondary | ICD-10-CM | POA: Diagnosis not present

## 2015-02-21 LAB — CBC WITH DIFFERENTIAL (CANCER CENTER ONLY)
BASO#: 0.1 10*3/uL (ref 0.0–0.2)
BASO%: 0.9 % (ref 0.0–2.0)
EOS ABS: 0.2 10*3/uL (ref 0.0–0.5)
EOS%: 3.4 % (ref 0.0–7.0)
HEMATOCRIT: 42.6 % (ref 38.7–49.9)
HGB: 14.6 g/dL (ref 13.0–17.1)
LYMPH#: 1 10*3/uL (ref 0.9–3.3)
LYMPH%: 18 % (ref 14.0–48.0)
MCH: 31.9 pg (ref 28.0–33.4)
MCHC: 34.3 g/dL (ref 32.0–35.9)
MCV: 93 fL (ref 82–98)
MONO#: 0.4 10*3/uL (ref 0.1–0.9)
MONO%: 7.3 % (ref 0.0–13.0)
NEUT%: 70.4 % (ref 40.0–80.0)
NEUTROS ABS: 4 10*3/uL (ref 1.5–6.5)
Platelets: 204 10*3/uL (ref 145–400)
RBC: 4.57 10*6/uL (ref 4.20–5.70)
RDW: 12.4 % (ref 11.1–15.7)
WBC: 5.6 10*3/uL (ref 4.0–10.0)

## 2015-02-21 LAB — COMPREHENSIVE METABOLIC PANEL
ALK PHOS: 73 U/L (ref 40–115)
ALT: 34 U/L (ref 9–46)
AST: 24 U/L (ref 10–35)
Albumin: 4.3 g/dL (ref 3.6–5.1)
BILIRUBIN TOTAL: 0.8 mg/dL (ref 0.2–1.2)
BUN: 20 mg/dL (ref 7–25)
CALCIUM: 9.1 mg/dL (ref 8.6–10.3)
CO2: 22 mmol/L (ref 20–31)
CREATININE: 1.49 mg/dL — AB (ref 0.70–1.25)
Chloride: 109 mmol/L (ref 98–110)
GLUCOSE: 91 mg/dL (ref 65–99)
Potassium: 4.5 mmol/L (ref 3.5–5.3)
SODIUM: 141 mmol/L (ref 135–146)
Total Protein: 6.7 g/dL (ref 6.1–8.1)

## 2015-02-21 LAB — D-DIMER, QUANTITATIVE: D-Dimer, Quant: 0.28 ug/mL-FEU (ref 0.00–0.48)

## 2015-02-24 NOTE — Progress Notes (Signed)
Hematology and Oncology Follow Up Visit  Joseph Tran [id number] [birth year] 64 y.o. 02/24/2015   Principle Diagnosis:   Thromboembolic disease of the right leg  Current Therapy:    Xarelto 20 mg by mouth daily-6-1 year of therapy required     Interim History:  Joseph Tran is back for follow-up. He is in pretty well. He has occasional swelling in the right leg. Most the time, it really does not bother him.  We did go ahead and repeat a Doppler today. The Doppler showed a residual nonocclusive thrombus in the common femoral vein.  He's had no problems with bleeding.  He is on Xarelto do well with this.  There's been no cough. He's had no chest wall pain. He's had no nausea vomiting. He's had no change in bowel or bladder habits.   Overall, his performance status is ECOG 1.  Medications:  Current outpatient prescriptions:  .  albuterol (VENTOLIN HFA) 108 (90 BASE) MCG/ACT inhaler, Inhale 2 puffs into the lungs every 4 (four) hours as needed for wheezing or shortness of breath., Disp: 3 Inhaler, Rfl: 3 .  buPROPion (WELLBUTRIN XL) 300 MG 24 hr tablet, TAKE 1 TABLET (300 MG TOTAL) BY MOUTH DAILY., Disp: 90 tablet, Rfl: 3 .  clonazePAM (KLONOPIN) 1 MG tablet, Take 1 tablet (1 mg total) by mouth 2 (two) times daily as needed for anxiety., Disp: 180 tablet, Rfl: 1 .  co-enzyme Q-10 30 MG capsule, Take 100 mg by mouth daily., Disp: , Rfl:  .  diclofenac (VOLTAREN) 75 MG EC tablet, 75 mg 2 (two) times daily. , Disp: , Rfl: 3 .  docusate sodium (COLACE) 100 MG capsule, Take 100 mg by mouth at bedtime as needed for mild constipation., Disp: , Rfl:  .  Melatonin 5 MG TABS, Take 1 mg by mouth at bedtime., Disp: , Rfl:  .  Multiple Vitamin (MULTIVITAMIN) tablet, Take 1 tablet by mouth daily. For men 50+, Disp: , Rfl:  .  OVER THE COUNTER MEDICATION, Take 750 mg by mouth every morning. " GABA ", Disp: , Rfl:  .  PRISTIQ 50 MG 24 hr tablet, TAKE 1 TABLET BY MOUTH EVERY DAY, Disp: 90 tablet,  Rfl: 3 .  sildenafil (VIAGRA) 100 MG tablet, Take 1 tablet (100 mg total) by mouth daily as needed., Disp: 10 tablet, Rfl: 11 .  tamsulosin (FLOMAX) 0.4 MG CAPS, Take 0.4 mg by mouth daily., Disp: , Rfl:  .  XARELTO 20 MG TABS tablet, TAKE 1 TABLET (20 MG TOTAL) BY MOUTH DAILY WITH SUPPER., Disp: , Rfl: 3  Allergies:  Allergies  Allergen Reactions  . Allopurinol   . Penicillins   . Sulfonamide Derivatives     Past Medical History, Surgical history, Social history, and Family History were reviewed and updated.  Review of Systems: As above  Physical Exam:  height is 6' (1.829 m) and weight is 241 lb (109.317 kg). His oral temperature is 98 F (36.7 C). His blood pressure is 129/77 and his pulse is 71. His respiration is 18.   Wt Readings from Last 3 Encounters:  02/21/15 241 lb (109.317 kg)  12/23/14 237 lb 12 oz (107.843 kg)  11/05/14 236 lb (107.049 kg)     Well-developed and well-nourished white gentleman no obvious distress. Head and neck exam shows no ocular or oral lesions. He has no palpable cervical or supraclavicular lymph nodes. Lungs are clear. Cardiac exam regular rate and rhythm with no murmurs, rubs or bruits. Abdomen is soft. He  has good bowel sounds. He is moderately obese. She has no fluid wave. There is no palpable liver or spleen tip. Back exam shows no tenderness over the spine, ribs or hips. Extremities shows some mild nonpitting edema of the right leg. He has a compression stocking on the right leg. No these cord is noted in the right lower leg. He has a negative Homans sign. Left leg is unremarkable. He has good pulses bilaterally in his distal extremities. Skin exam shows no rashes, ecchymoses or petechia. Neurological exam shows no focal neurological deficits.  Lab Results  Component Value Date   WBC 5.6 02/21/2015   HGB 14.6 02/21/2015   HCT 42.6 02/21/2015   MCV 93 02/21/2015   PLT 204 02/21/2015     Chemistry      Component Value Date/Time   NA 141  02/21/2015 1336   K 4.5 02/21/2015 1336   CL 109 02/21/2015 1336   CO2 22 02/21/2015 1336   BUN 20 02/21/2015 1336   CREATININE 1.49* 02/21/2015 1336      Component Value Date/Time   CALCIUM 9.1 02/21/2015 1336   ALKPHOS 73 02/21/2015 1336   AST 24 02/21/2015 1336   ALT 34 02/21/2015 1336   BILITOT 0.8 02/21/2015 1336         Impression and Plan: Joseph Tran is 64 year old gentleman. He has a thrombus in the right leg. I had to believe that this is secondary to him being on testosterone supplementation.   The Doppler today still shows a thrombus in the right leg.  He is on Xarelto and doing well with this.  It is possible that he may need a year of anticoagulation. He presented back in April 2016.  I want to see him back in 4 months. I want to do a Doppler was see him back. This may help [place] decide as whether or not we have to keep him on anticoagulation long-term, just possibly a maintenance dose.  I spent about 30 minutes with him today. I answered all of his questions.     Volanda Napoleon, MD 8/22/20167:05 AM

## 2015-03-11 ENCOUNTER — Encounter: Payer: Self-pay | Admitting: Family Medicine

## 2015-03-11 ENCOUNTER — Ambulatory Visit (INDEPENDENT_AMBULATORY_CARE_PROVIDER_SITE_OTHER): Payer: BLUE CROSS/BLUE SHIELD | Admitting: Family Medicine

## 2015-03-11 VITALS — BP 124/72 | HR 74 | Temp 98.8°F | Ht 72.0 in | Wt 240.0 lb

## 2015-03-11 DIAGNOSIS — J019 Acute sinusitis, unspecified: Secondary | ICD-10-CM | POA: Diagnosis not present

## 2015-03-11 MED ORDER — HYDROCODONE-HOMATROPINE 5-1.5 MG/5ML PO SYRP: 5.0000 mL | ORAL_SOLUTION | ORAL | Status: DC | PRN

## 2015-03-11 MED ORDER — AZITHROMYCIN 250 MG PO TABS
ORAL_TABLET | ORAL | Status: DC
Start: 2015-03-11 — End: 2015-10-29

## 2015-03-11 NOTE — Progress Notes (Signed)
Pre visit review using our clinic review tool, if applicable. No additional management support is needed unless otherwise documented below in the visit note. 

## 2015-03-11 NOTE — Progress Notes (Signed)
   Subjective:    Patient ID: Joseph Tran, male    DOB: [birth year], 64 y.o.   MRN: [id number]  HPI Here for 5 days of sinus pressure, PND, and a dry cough. No fever. He is taking Mucinex with little benefit.    Review of Systems  Constitutional: Negative.   HENT: Positive for congestion, postnasal drip and sinus pressure.   Eyes: Negative.   Respiratory: Positive for choking. Negative for chest tightness and shortness of breath.        Objective:   Physical Exam  Constitutional: He appears well-developed and well-nourished.  HENT:  Right Ear: External ear normal.  Left Ear: External ear normal.  Nose: Nose normal.  Mouth/Throat: Oropharynx is clear and moist.  Eyes: Conjunctivae are normal.  Neck: No thyromegaly present.  Cardiovascular: Normal rate, regular rhythm, normal heart sounds and intact distal pulses.   Pulmonary/Chest: Effort normal and breath sounds normal.  Lymphadenopathy:    He has no cervical adenopathy.          Assessment & Plan:  Sinusitis, treat with a Zpack. Drink fluids.

## 2015-04-10 ENCOUNTER — Other Ambulatory Visit (INDEPENDENT_AMBULATORY_CARE_PROVIDER_SITE_OTHER): Payer: BLUE CROSS/BLUE SHIELD

## 2015-04-10 DIAGNOSIS — Z0000 Encounter for general adult medical examination without abnormal findings: Secondary | ICD-10-CM | POA: Diagnosis not present

## 2015-04-10 LAB — CBC WITH DIFFERENTIAL/PLATELET
BASOS ABS: 0 10*3/uL (ref 0.0–0.1)
BASOS PCT: 0.9 % (ref 0.0–3.0)
EOS ABS: 0.3 10*3/uL (ref 0.0–0.7)
Eosinophils Relative: 5.2 % — ABNORMAL HIGH (ref 0.0–5.0)
HCT: 42.3 % (ref 39.0–52.0)
Hemoglobin: 14.3 g/dL (ref 13.0–17.0)
Lymphocytes Relative: 22.6 % (ref 12.0–46.0)
Lymphs Abs: 1.2 10*3/uL (ref 0.7–4.0)
MCHC: 33.7 g/dL (ref 30.0–36.0)
MCV: 93.9 fl (ref 78.0–100.0)
MONO ABS: 0.4 10*3/uL (ref 0.1–1.0)
Monocytes Relative: 7.3 % (ref 3.0–12.0)
NEUTROS ABS: 3.3 10*3/uL (ref 1.4–7.7)
NEUTROS PCT: 64 % (ref 43.0–77.0)
PLATELETS: 195 10*3/uL (ref 150.0–400.0)
RBC: 4.5 Mil/uL (ref 4.22–5.81)
RDW: 13.7 % (ref 11.5–15.5)
WBC: 5.2 10*3/uL (ref 4.0–10.5)

## 2015-04-10 LAB — HEPATIC FUNCTION PANEL
ALT: 23 U/L (ref 0–53)
AST: 21 U/L (ref 0–37)
Albumin: 3.9 g/dL (ref 3.5–5.2)
Alkaline Phosphatase: 65 U/L (ref 39–117)
BILIRUBIN DIRECT: 0.1 mg/dL (ref 0.0–0.3)
BILIRUBIN TOTAL: 0.8 mg/dL (ref 0.2–1.2)
TOTAL PROTEIN: 6.3 g/dL (ref 6.0–8.3)

## 2015-04-10 LAB — LIPID PANEL
CHOL/HDL RATIO: 5
Cholesterol: 225 mg/dL — ABNORMAL HIGH (ref 0–200)
HDL: 44.7 mg/dL (ref >39.00)
LDL CALC: 152 mg/dL — AB (ref 0–99)
NonHDL: 180.11
TRIGLYCERIDES: 141 mg/dL (ref 0.0–149.0)
VLDL: 28.2 mg/dL (ref 0.0–40.0)

## 2015-04-10 LAB — BASIC METABOLIC PANEL
BUN: 18 mg/dL (ref 6–23)
CALCIUM: 9.3 mg/dL (ref 8.4–10.5)
CHLORIDE: 111 meq/L (ref 96–112)
CO2: 25 meq/L (ref 19–32)
CREATININE: 1.33 mg/dL (ref 0.40–1.50)
GFR: 57.57 mL/min — ABNORMAL LOW (ref >60.00)
Glucose, Bld: 87 mg/dL (ref 70–99)
Potassium: 4.7 mEq/L (ref 3.5–5.1)
Sodium: 143 mEq/L (ref 135–145)

## 2015-04-10 LAB — POCT URINALYSIS DIPSTICK
BILIRUBIN UA: NEGATIVE
GLUCOSE UA: NEGATIVE
KETONES UA: NEGATIVE
Leukocytes, UA: NEGATIVE
NITRITE UA: NEGATIVE
Protein, UA: NEGATIVE
RBC UA: NEGATIVE
Spec Grav, UA: >=1.03
Urobilinogen, UA: 0.2
pH, UA: 5

## 2015-04-10 LAB — TSH: TSH: 1.75 u[IU]/mL (ref 0.35–4.50)

## 2015-04-10 LAB — PSA: PSA: 2.3 ng/mL (ref 0.10–4.00)

## 2015-04-22 ENCOUNTER — Ambulatory Visit (INDEPENDENT_AMBULATORY_CARE_PROVIDER_SITE_OTHER): Payer: BLUE CROSS/BLUE SHIELD | Admitting: Family Medicine

## 2015-04-22 ENCOUNTER — Encounter: Payer: Self-pay | Admitting: Family Medicine

## 2015-04-22 VITALS — BP 131/72 | HR 88 | Temp 99.2°F | Ht 72.0 in | Wt 240.0 lb

## 2015-04-22 DIAGNOSIS — Z0000 Encounter for general adult medical examination without abnormal findings: Secondary | ICD-10-CM | POA: Diagnosis not present

## 2015-04-22 DIAGNOSIS — Z23 Encounter for immunization: Secondary | ICD-10-CM | POA: Diagnosis not present

## 2015-04-22 MED ORDER — CLONAZEPAM 1 MG PO TABS: 1.0000 mg | ORAL_TABLET | Freq: Two times a day (BID) | ORAL | Status: DC | PRN

## 2015-04-22 MED ORDER — SILDENAFIL CITRATE 100 MG PO TABS: 100.0000 mg | ORAL_TABLET | Freq: Every day | ORAL | Status: DC | PRN

## 2015-04-22 NOTE — Progress Notes (Signed)
Pre visit review using our clinic review tool, if applicable. No additional management support is needed unless otherwise documented below in the visit note. 

## 2015-04-22 NOTE — Progress Notes (Signed)
   Subjective:    Patient ID: Joseph Tran, male    DOB: 07-20-1950, 64 y.o.   MRN: [id number]  HPI 64 yr old male for a cpx. He feels well except he has trouble losing weight and he is worried he may be developing dementia. He has a strong family hx of dementia including 2 aunts and an uncle on is father's side. He has noticed more of a tendency for him to forget simple things over the past 6 months. 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 He continues to see Dr. Marin Olp for the DVT in his leg, and this has prevented him from getting much exercise. He used to see Dr. Karsten Ro for prostate exams, but he asks if we can assume care of this issue. He urinates easily and his recent PSA was normal. His last colonoscopy in 2011 was in [place] and I have advised him to get another one here this year. He says he does not know anyone who could drive him home that day.    Review of Systems  Constitutional: Negative.   HENT: Negative.   Eyes: Negative.   Respiratory: Negative.   Cardiovascular: Negative.   Gastrointestinal: Negative.   Genitourinary: Negative.   Musculoskeletal: Negative.   Skin: Negative.   Neurological: Negative.   Psychiatric/Behavioral: Negative.        Objective:   Physical Exam  Constitutional: He is oriented to person, place, and time. He appears well-developed and well-nourished. No distress.  HENT:  Head: Normocephalic and atraumatic.  Right Ear: External ear normal.  Left Ear: External ear normal.  Nose: Nose normal.  Mouth/Throat: Oropharynx is clear and moist. No oropharyngeal exudate.  Eyes: Conjunctivae and EOM are normal. Pupils are equal, round, and reactive to light. Right eye exhibits no discharge. Left eye exhibits no discharge. No scleral icterus.  Neck: Neck supple. No JVD present. No tracheal deviation present. No thyromegaly present.  Cardiovascular:  Normal rate, regular rhythm, normal heart sounds and intact distal pulses.  Exam reveals no gallop and no friction rub.   No murmur heard. EKG normal   Pulmonary/Chest: Effort normal and breath sounds normal. No respiratory distress. He has no wheezes. He has no rales. He exhibits no tenderness.  Abdominal: Soft. Bowel sounds are normal. He exhibits no distension and no mass. There is no tenderness. There is no rebound and no guarding.  Genitourinary: Rectum normal, prostate normal and penis normal. Guaiac negative stool. No penile tenderness.  Musculoskeletal: Normal range of motion. He exhibits no edema or tenderness.  Lymphadenopathy:    He has no cervical adenopathy.  Neurological: He is alert and oriented to person, place, and time. He has normal reflexes. No cranial nerve deficit. He exhibits normal muscle tone. Coordination normal.  Skin: Skin is warm and dry. No rash noted. He is not diaphoretic. No erythema. No pallor.  Psychiatric: He has a normal mood and affect. His behavior is normal. Judgment and thought content normal.          Assessment & Plan:  Well exam. We discussed diet and exercise advice. His prostate exam was unremarkable and I told him he would not need to follow up with Urology at this point. He will ask his family members if someone could have a time free to take him to a colonoscopy, and he will let us know.

## 2015-05-13 ENCOUNTER — Telehealth: Payer: Self-pay | Admitting: Family Medicine

## 2015-05-13 DIAGNOSIS — Z0000 Encounter for general adult medical examination without abnormal findings: Secondary | ICD-10-CM

## 2015-05-13 NOTE — Telephone Encounter (Signed)
Pt is calling back to let md know the date he is available for colonoscopy is  07/01/15

## 2015-05-15 NOTE — Telephone Encounter (Signed)
This was done.

## 2015-05-16 ENCOUNTER — Telehealth: Payer: Self-pay | Admitting: Gastroenterology

## 2015-05-16 NOTE — Telephone Encounter (Signed)
Colon report received. Giving report to [facility] for Dr. Fuller Plan to review as [employer].

## 2015-06-24 ENCOUNTER — Ambulatory Visit (HOSPITAL_BASED_OUTPATIENT_CLINIC_OR_DEPARTMENT_OTHER)
Admission: RE | Admit: 2015-06-24 | Discharge: 2015-06-24 | Disposition: A | Discharge status: Home / Self Care | Payer: BLUE CROSS/BLUE SHIELD | Source: Ambulatory Visit | Attending: Hematology & Oncology | Admitting: Hematology & Oncology

## 2015-06-24 DIAGNOSIS — Z7901 Long term (current) use of anticoagulants: Secondary | ICD-10-CM | POA: Diagnosis not present

## 2015-06-24 DIAGNOSIS — I82511 Chronic embolism and thrombosis of right femoral vein: Secondary | ICD-10-CM | POA: Diagnosis not present

## 2015-06-24 DIAGNOSIS — I82531 Chronic embolism and thrombosis of right popliteal vein: Secondary | ICD-10-CM | POA: Insufficient documentation

## 2015-06-24 DIAGNOSIS — I82401 Acute embolism and thrombosis of unspecified deep veins of right lower extremity: Secondary | ICD-10-CM | POA: Insufficient documentation

## 2015-06-25 ENCOUNTER — Other Ambulatory Visit (HOSPITAL_BASED_OUTPATIENT_CLINIC_OR_DEPARTMENT_OTHER): Payer: BLUE CROSS/BLUE SHIELD

## 2015-06-25 ENCOUNTER — Encounter: Payer: Self-pay | Admitting: Family

## 2015-06-25 ENCOUNTER — Ambulatory Visit (HOSPITAL_BASED_OUTPATIENT_CLINIC_OR_DEPARTMENT_OTHER): Payer: BLUE CROSS/BLUE SHIELD | Admitting: Family

## 2015-06-25 VITALS — BP 122/76 | HR 80 | Temp 98.0°F | Resp 16 | Ht 72.0 in | Wt 244.0 lb

## 2015-06-25 DIAGNOSIS — I82531 Chronic embolism and thrombosis of right popliteal vein: Secondary | ICD-10-CM

## 2015-06-25 DIAGNOSIS — G4730 Sleep apnea, unspecified: Secondary | ICD-10-CM

## 2015-06-25 DIAGNOSIS — I82409 Acute embolism and thrombosis of unspecified deep veins of unspecified lower extremity: Secondary | ICD-10-CM

## 2015-06-25 LAB — COMPREHENSIVE METABOLIC PANEL (CC13)
ALBUMIN: 4.1 g/dL (ref 3.6–5.1)
ALK PHOS: 63 U/L (ref 40–115)
ALT: 26 U/L (ref 9–46)
AST: 23 U/L (ref 10–35)
BILIRUBIN TOTAL: 0.6 mg/dL (ref 0.2–1.2)
BUN: 17 mg/dL (ref 7–25)
CO2: 24 mmol/L (ref 20–31)
CREATININE: 1.3 mg/dL — AB (ref 0.70–1.25)
Calcium: 9.3 mg/dL (ref 8.6–10.3)
Chloride: 107 mmol/L (ref 98–110)
Glucose, Bld: 89 mg/dL (ref 65–99)
Potassium: 4.3 mmol/L (ref 3.5–5.3)
SODIUM: 137 mmol/L (ref 135–146)
TOTAL PROTEIN: 6.7 g/dL (ref 6.1–8.1)

## 2015-06-25 LAB — CBC WITH DIFFERENTIAL (CANCER CENTER ONLY)
BASO#: 0.1 10*3/uL (ref 0.0–0.2)
BASO%: 1 % (ref 0.0–2.0)
EOS ABS: 0.2 10*3/uL (ref 0.0–0.5)
EOS%: 2.6 % (ref 0.0–7.0)
HCT: 43.8 % (ref 38.7–49.9)
HEMOGLOBIN: 14.5 g/dL (ref 13.0–17.1)
LYMPH#: 1 10*3/uL (ref 0.9–3.3)
LYMPH%: 16.4 % (ref 14.0–48.0)
MCH: 31.2 pg (ref 28.0–33.4)
MCHC: 33.1 g/dL (ref 32.0–35.9)
MCV: 94 fL (ref 82–98)
MONO#: 0.4 10*3/uL (ref 0.1–0.9)
MONO%: 6.2 % (ref 0.0–13.0)
NEUT%: 73.8 % (ref 40.0–80.0)
NEUTROS ABS: 4.6 10*3/uL (ref 1.5–6.5)
PLATELETS: 193 10*3/uL (ref 145–400)
RBC: 4.65 10*6/uL (ref 4.20–5.70)
RDW: 12 % (ref 11.1–15.7)
WBC: 6.3 10*3/uL (ref 4.0–10.0)

## 2015-06-25 NOTE — Progress Notes (Signed)
Hematology and Oncology Follow Up Visit  Joseph Tran [id number] 26-Oct-1950 64 y.o. 06/25/2015   Principle Diagnosis:  Thromboembolic disease of the right leg  Current Therapy:   Xarelto 20 mg by mouth daily - Will complete 1 year of therapy in April 2017    Interim History:  Joseph Tran is here today for a follow-up. He is doing well. He still has occasional swelling and tenderness in the right leg when he is on his feet for an extended period of time. He wears a compression stocking as needed. This does help.  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 He is taking Xarelto 20 mg daily and has had no episodes of bleeding or bruising.  He is well hydrated and has maintained a healthy appetite. His weight is stable.  He is staying active out side and plans to start riding horses again.  He is using his CPAP machine each night and feels well rested.  No fever, chills, n/v, cough, rash, dizziness, chest pain, palpitations, abdominal pain or changes in bowel or bladder habits. He has some SOB at times with exertion that resolves with rest.  He has occasional numbness and tingling in his hands that comes and goes.    Medications:    Medication List       This list is accurate as of: 06/25/15  2:46 PM.  Always use your most recent med list.               albuterol 108 (90 BASE) MCG/ACT inhaler  Commonly known as:  VENTOLIN HFA  Inhale 2 puffs into the lungs every 4 (four) hours as needed for wheezing or shortness of breath.     azithromycin 250 MG tablet  Commonly known as:  ZITHROMAX  As directed     buPROPion 300 MG 24 hr tablet  Commonly known as:  WELLBUTRIN XL  TAKE 1 TABLET (300 MG TOTAL) BY MOUTH DAILY.     clonazePAM 1 MG tablet  Commonly known as:  KLONOPIN  Take 1 tablet (1 mg total) by mouth 2 (two) times daily as needed for anxiety.     co-enzyme Q-10 30 MG capsule  Take 100 mg by  mouth daily.     diclofenac 75 MG EC tablet  Commonly known as:  VOLTAREN  75 mg 2 (two) times daily.     docusate sodium 100 MG capsule  Commonly known as:  COLACE  Take 100 mg by mouth at bedtime as needed for mild constipation.     HYDROcodone-homatropine 5-1.5 MG/5ML syrup  Commonly known as:  HYDROMET  Take 5 mLs by mouth every 4 (four) hours as needed.     Magnesium 250 MG Tabs  Take 500 mg by mouth at bedtime.     Melatonin 5 MG Tabs  Take 1 mg by mouth at bedtime.     multivitamin tablet  Take 1 tablet by mouth daily. For men 50+     OVER THE COUNTER MEDICATION  Take 750 mg by mouth every morning. " GABA "     PRISTIQ 50 MG 24 hr tablet  Generic drug:  desvenlafaxine  TAKE 1 TABLET BY MOUTH EVERY DAY     sildenafil 100 MG tablet  Commonly known as:  VIAGRA  Take 1 tablet (100 mg total) by mouth daily as needed.     tamsulosin 0.4 MG Caps capsule  Commonly known as:  FLOMAX  Take 0.4 mg by mouth daily.     XARELTO 20 MG Tabs tablet  Generic drug:  rivaroxaban  TAKE 1 TABLET (20 MG TOTAL) BY MOUTH DAILY WITH SUPPER.        Allergies:  Allergies  Allergen Reactions  . Allopurinol   . Penicillins   . Sulfonamide Derivatives     Past Medical History, Surgical history, Social history, and Family History were reviewed and updated.  Review of Systems: All other 10 point review of systems is negative.   Physical Exam:  height is 6' (1.829 m) and weight is 244 lb (110.678 kg). His oral temperature is 98 F (36.7 C). His blood pressure is 122/76 and his pulse is 80. His respiration is 16.   Wt Readings from Last 3 Encounters:  06/25/15 244 lb (110.678 kg)  04/22/15 240 lb (108.863 kg)  03/11/15 240 lb (108.863 kg)    Ocular: Sclerae unicteric, pupils equal, round and reactive to light Ear-nose-throat: Oropharynx clear, dentition fair Lymphatic: No cervical supraclavicular or axillary adenopathy Lungs no rales or rhonchi, good excursion  bilaterally Heart regular rate and rhythm, no murmur appreciated Abd soft, nontender, positive bowel sounds, no liver or spleen tip palpated on exam MSK no focal spinal tenderness, no joint edema Neuro: non-focal, well-oriented, appropriate affect Breasts: Deferred  Lab Results  Component Value Date   WBC 6.3 06/25/2015   HGB 14.5 06/25/2015   HCT 43.8 06/25/2015   MCV 94 06/25/2015   PLT 193 06/25/2015   No results found for: FERRITIN, IRON, TIBC, UIBC, IRONPCTSAT Lab Results  Component Value Date   RBC 4.65 06/25/2015   No results found for: KPAFRELGTCHN, LAMBDASER, KAPLAMBRATIO No results found for: IGGSERUM, IGA, IGMSERUM No results found for: Odetta Pink, SPEI   Chemistry      Component Value Date/Time   NA 143 04/10/2015 0811   K 4.7 04/10/2015 0811   CL 111 04/10/2015 0811   CO2 25 04/10/2015 0811   BUN 18 04/10/2015 0811   CREATININE 1.33 04/10/2015 0811      Component Value Date/Time   CALCIUM 9.3 04/10/2015 0811   ALKPHOS 65 04/10/2015 0811   AST 21 04/10/2015 0811   ALT 23 04/10/2015 0811   BILITOT 0.8 04/10/2015 0811     Impression and Plan: Joseph Tran is 64 yo gentleman with a 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  He is now on xarelto 20 mg daily and will complete 1 year of therapy in April 2017.  He is using a CPAP nightly now and feeling much better.  He occasionally has some tenderness and swelling in the the right leg. He wears a compression stocking as needed to help prevent this.  Doppler today show a nonocclusive chronic DVT of the popliteal, femoral and common femoral veins.  We will plan to see him back in 4 months and hopefully at that time be able to transition him to aspirin.  He will contact us with any questions or concerns. We can certainly see him sooner if need be.    Eliezer Bottom, NP 12/21/20162:46 PM

## 2015-08-05 ENCOUNTER — Encounter: Payer: Self-pay | Admitting: Family Medicine

## 2015-08-05 ENCOUNTER — Telehealth: Payer: Self-pay | Admitting: Family Medicine

## 2015-08-05 ENCOUNTER — Ambulatory Visit (INDEPENDENT_AMBULATORY_CARE_PROVIDER_SITE_OTHER): Payer: BLUE CROSS/BLUE SHIELD | Admitting: Family Medicine

## 2015-08-05 VITALS — BP 144/78 | HR 73 | Temp 99.3°F | Ht 72.0 in | Wt 238.0 lb

## 2015-08-05 DIAGNOSIS — S36039D Unspecified laceration of spleen, subsequent encounter: Secondary | ICD-10-CM

## 2015-08-05 DIAGNOSIS — M4846XD Fatigue fracture of vertebra, lumbar region, subsequent encounter for fracture with routine healing: Secondary | ICD-10-CM

## 2015-08-05 DIAGNOSIS — S060X9A Concussion with loss of consciousness of unspecified duration, initial encounter: Secondary | ICD-10-CM | POA: Insufficient documentation

## 2015-08-05 DIAGNOSIS — M4846XA Fatigue fracture of vertebra, lumbar region, initial encounter for fracture: Secondary | ICD-10-CM | POA: Insufficient documentation

## 2015-08-05 DIAGNOSIS — S060X1D Concussion with loss of consciousness of 30 minutes or less, subsequent encounter: Secondary | ICD-10-CM | POA: Diagnosis not present

## 2015-08-05 DIAGNOSIS — S36039A Unspecified laceration of spleen, initial encounter: Secondary | ICD-10-CM | POA: Insufficient documentation

## 2015-08-05 NOTE — Progress Notes (Signed)
   Subjective:    Patient ID: Joseph Tran, male    DOB: Aug 11, 1950, 65 y.o.   MRN: [id number]  HPI Here to follow up injuries sustained on 08-02-15 when he fell off a horse he was riding. A strap broke which holds the saddle in place, causing the saddle to shift and he fell off. He struck his head against a door frame of the stable, knocking himself out briefly. He also had pain in the abdomen and back. He was taken to [facility] where he was diagnosed with a concussion. Serial CT scans of the head showed a tiny amount of blood in a ventricle at first but this quickly cleared. He had symptoms of a severe headache, dizziness, confusion, blurred vision, and nausea. Over time the headaches have improved to where he only has a dull pain now. His vision has returned to normal. He is no longer confused but sometimes he has trouble putting his thoughts into words. He still has some mild memory difficulties. He also lacerated his spleen during the fall, and he was watched closely in ICU for 24 hours, but no hemorrhaging was noted. Finally he has a fracture of the left transverse process of L2, and he still has stiffness and pain in the lower back. He is eating and drinking well. BMs and urinations are normal. He has been out of work and has not driven since the fall. He is using only Diclofenac for pain currently.    Review of Systems  Constitutional: Negative.   Eyes: Negative.   Respiratory: Negative.   Cardiovascular: Negative.   Gastrointestinal: Negative.   Genitourinary: Negative.   Musculoskeletal: Positive for back pain.  Neurological: Positive for dizziness and headaches. Negative for speech difficulty.       Objective:   Physical Exam  Constitutional: He is oriented to person, place, and time. He appears well-developed and well-nourished.  HENT:  He has an abrasion on the left forehead   Eyes: Conjunctivae and EOM are normal. Pupils are equal, round, and reactive to light.  Neck: Normal  range of motion. Neck supple. No thyromegaly present.  Cardiovascular: Normal rate, regular rhythm, normal heart sounds and intact distal pulses.   Pulmonary/Chest: Effort normal and breath sounds normal.  Abdominal: Soft. Bowel sounds are normal. He exhibits no distension and no mass. There is no tenderness. There is no rebound and no guarding.  Musculoskeletal:  He is very tender over the lower left back area with full ROM. Also tender over the left middle back and left flank, no masses felt   Lymphadenopathy:    He has no cervical adenopathy.  Neurological: He is alert and oriented to person, place, and time. No cranial nerve deficit. He exhibits normal muscle tone. Coordination normal.          Assessment & Plan:  He is recovering from a concussion, a splenic laceration, and a lumbar fracture. We will keep him out of work at least another week and he will refrain from driving at least another week. Recheck with [place] in one week

## 2015-08-05 NOTE — Telephone Encounter (Signed)
Pt is on schedule for today 08/05/2015 to see Dr. Sarajane Jews.

## 2015-08-05 NOTE — Progress Notes (Signed)
Pre visit review using our clinic review tool, if applicable. No additional management support is needed unless otherwise documented below in the visit note. 

## 2015-08-05 NOTE — Telephone Encounter (Signed)
[facility]     Patient Name: Joseph Tran Initial Comment caller states he had a fall from a horse over the weekend - was in the hospital and was discharged yesterday - was dx with a concussion and a bruised spleen - is still having a headache this am - The hospital told him to follow up with his dr.   [name]: Sep 08, 1950      Nurse Assessment  Nurse: Luther Parody, RN, Malachy Mood Date/Time (Eastern Time): 08/05/2015 10:29:54 AM  Confirm and document reason for call. If symptomatic, describe symptoms. You must click the next button to save text entered. ---Caller states that he fell off of a horse over the weekend and was discharged home yesterday. He is supposed to f/u with his pcp. Pt is c/o h/a but states that it is actually getting better daily. Pt is on xarelto for a blood clot in his right leg.  Has the patient traveled out of the country within the last 30 days? ---Not Applicable  Does the patient have any new or worsening symptoms? ---Yes  Will a triage be completed? ---Yes  Related visit to physician within the last 2 weeks? ---Yes  Does the PT have any chronic conditions? (i.e. diabetes, asthma, etc.) ---Yes  List chronic conditions. ---asthma, depression, thrombosis  Is this a behavioral health or substance abuse call? ---No    Guidelines     Guideline Title Affirmed Question Affirmed Notes   Concussion (mTBI) Less Than 14 Days Ago Follow-up Call [1] Concussion symptoms SAME (not worse) AND [2] taking Coumadin (warfarin) or other strong blood thinner, or known bleeding disorder (e.g., thrombocytopenia)    Final Disposition User   See Physician within [place], RN, [employer]     Referrals   REFERRED TO PCP OFFICE   Disagree/Comply: Leta Baptist

## 2015-08-11 ENCOUNTER — Encounter: Payer: Self-pay | Admitting: Family Medicine

## 2015-08-11 ENCOUNTER — Ambulatory Visit (INDEPENDENT_AMBULATORY_CARE_PROVIDER_SITE_OTHER): Payer: BLUE CROSS/BLUE SHIELD | Admitting: Family Medicine

## 2015-08-11 VITALS — BP 138/76 | HR 71 | Temp 98.8°F | Ht 72.0 in | Wt 240.0 lb

## 2015-08-11 DIAGNOSIS — M4846XD Fatigue fracture of vertebra, lumbar region, subsequent encounter for fracture with routine healing: Secondary | ICD-10-CM

## 2015-08-11 DIAGNOSIS — S060X1D Concussion with loss of consciousness of 30 minutes or less, subsequent encounter: Secondary | ICD-10-CM

## 2015-08-11 NOTE — Progress Notes (Signed)
   Subjective:    Patient ID: Joseph Tran, male    DOB: [birth year], 65 y.o.   MRN: [id number]  HPI Here to follow up injuries sustained on 08-02-15 when he feel off a horse. He had a concussion, a splenic laceration and a vertebral fracture. He has been out of work one week and he has avoided driving until today. The drive over here was very painful for him in the left lower back. His concussion symptoms are improving but he still has a mild dull HA and some mild dizziness. He is using heat and Tylenol for the back pain.    Review of Systems  Constitutional: Negative.   Eyes: Negative.   Gastrointestinal: Negative.   Genitourinary: Negative.   Musculoskeletal: Positive for back pain.  Neurological: Positive for dizziness and headaches.       Objective:   Physical Exam  Constitutional: He is oriented to person, place, and time. He appears well-developed and well-nourished.  In mild pain   Cardiovascular: Normal rate, regular rhythm and normal heart sounds.   Pulmonary/Chest: Effort normal and breath sounds normal.  Musculoskeletal:  Still very tender in the left lower back   Neurological: He is alert and oriented to person, place, and time.          Assessment & Plan:  He is recovering from a concussion and a lumbar vertebral fracture. He has Oxycodone at home and I urged him to use this at night to help with pain control. He will stay out of work another week.

## 2015-08-11 NOTE — Progress Notes (Signed)
Pre visit review using our clinic review tool, if applicable. No additional management support is needed unless otherwise documented below in the visit note. 

## 2015-09-01 ENCOUNTER — Other Ambulatory Visit: Payer: Self-pay | Admitting: Family Medicine

## 2015-09-04 ENCOUNTER — Other Ambulatory Visit: Payer: Self-pay | Admitting: Family Medicine

## 2015-09-05 NOTE — Telephone Encounter (Signed)
Pt last visit 08/11/15 Pt last refill 10/11/14 #90 with 3 refills

## 2015-10-27 ENCOUNTER — Other Ambulatory Visit: Payer: Self-pay | Admitting: Hematology & Oncology

## 2015-10-29 ENCOUNTER — Other Ambulatory Visit (HOSPITAL_BASED_OUTPATIENT_CLINIC_OR_DEPARTMENT_OTHER): Payer: BLUE CROSS/BLUE SHIELD

## 2015-10-29 ENCOUNTER — Ambulatory Visit (HOSPITAL_BASED_OUTPATIENT_CLINIC_OR_DEPARTMENT_OTHER)
Admission: RE | Admit: 2015-10-29 | Discharge: 2015-10-29 | Disposition: A | Discharge status: Home / Self Care | Payer: BLUE CROSS/BLUE SHIELD | Source: Ambulatory Visit | Attending: Hematology & Oncology | Admitting: Hematology & Oncology

## 2015-10-29 ENCOUNTER — Encounter: Payer: Self-pay | Admitting: Family

## 2015-10-29 ENCOUNTER — Ambulatory Visit (HOSPITAL_BASED_OUTPATIENT_CLINIC_OR_DEPARTMENT_OTHER): Payer: BLUE CROSS/BLUE SHIELD | Admitting: Family

## 2015-10-29 VITALS — BP 136/81 | HR 78 | Temp 98.5°F | Resp 16 | Ht 72.0 in | Wt 238.0 lb

## 2015-10-29 DIAGNOSIS — I82511 Chronic embolism and thrombosis of right femoral vein: Secondary | ICD-10-CM | POA: Diagnosis not present

## 2015-10-29 DIAGNOSIS — I82591 Chronic embolism and thrombosis of other specified deep vein of right lower extremity: Secondary | ICD-10-CM | POA: Diagnosis not present

## 2015-10-29 DIAGNOSIS — I82531 Chronic embolism and thrombosis of right popliteal vein: Secondary | ICD-10-CM

## 2015-10-29 DIAGNOSIS — I8001 Phlebitis and thrombophlebitis of superficial vessels of right lower extremity: Secondary | ICD-10-CM | POA: Insufficient documentation

## 2015-10-29 DIAGNOSIS — G4730 Sleep apnea, unspecified: Secondary | ICD-10-CM

## 2015-10-29 DIAGNOSIS — I82401 Acute embolism and thrombosis of unspecified deep veins of right lower extremity: Secondary | ICD-10-CM

## 2015-10-29 DIAGNOSIS — I82431 Acute embolism and thrombosis of right popliteal vein: Secondary | ICD-10-CM | POA: Diagnosis not present

## 2015-10-29 DIAGNOSIS — I82409 Acute embolism and thrombosis of unspecified deep veins of unspecified lower extremity: Secondary | ICD-10-CM

## 2015-10-29 LAB — COMPREHENSIVE METABOLIC PANEL (CC13)
ALBUMIN: 4.2 g/dL (ref 3.6–4.8)
ALT: 24 IU/L (ref 0–44)
AST (SGOT): 25 IU/L (ref 0–40)
Albumin/Globulin Ratio: 1.6 (ref 1.2–2.2)
Alkaline Phosphatase, S: 86 IU/L (ref 39–117)
BUN / CREAT RATIO: 12 (ref 10–24)
BUN: 25 mg/dL (ref 8–27)
Bilirubin Total: 0.4 mg/dL (ref 0.0–1.2)
CHLORIDE: 108 mmol/L — AB (ref 96–106)
CO2: 23 mmol/L (ref 18–29)
CREATININE: 2.1 mg/dL — AB (ref 0.76–1.27)
Calcium, Ser: 9.8 mg/dL (ref 8.6–10.2)
GFR calc non Af Amer: 32 mL/min/{1.73_m2} — ABNORMAL LOW (ref >59)
GFR, EST AFRICAN AMERICAN: 37 mL/min/{1.73_m2} — AB (ref >59)
GLUCOSE: 75 mg/dL (ref 65–99)
Globulin, Total: 2.7 g/dL (ref 1.5–4.5)
Potassium, Ser: 4.6 mmol/L (ref 3.5–5.2)
Sodium: 139 mmol/L (ref 134–144)
TOTAL PROTEIN: 6.9 g/dL (ref 6.0–8.5)

## 2015-10-29 LAB — CBC WITH DIFFERENTIAL (CANCER CENTER ONLY)
BASO#: 0.1 10*3/uL (ref 0.0–0.2)
BASO%: 0.9 % (ref 0.0–2.0)
EOS%: 3.1 % (ref 0.0–7.0)
Eosinophils Absolute: 0.2 10*3/uL (ref 0.0–0.5)
HCT: 39.5 % (ref 38.7–49.9)
HEMOGLOBIN: 13.2 g/dL (ref 13.0–17.1)
LYMPH#: 1.1 10*3/uL (ref 0.9–3.3)
LYMPH%: 17.2 % (ref 14.0–48.0)
MCH: 31.8 pg (ref 28.0–33.4)
MCHC: 33.4 g/dL (ref 32.0–35.9)
MCV: 95 fL (ref 82–98)
MONO#: 0.3 10*3/uL (ref 0.1–0.9)
MONO%: 4.8 % (ref 0.0–13.0)
NEUT%: 74 % (ref 40.0–80.0)
NEUTROS ABS: 4.8 10*3/uL (ref 1.5–6.5)
PLATELETS: 185 10*3/uL (ref 145–400)
RBC: 4.15 10*6/uL — AB (ref 4.20–5.70)
RDW: 12.3 % (ref 11.1–15.7)
WBC: 6.5 10*3/uL (ref 4.0–10.0)

## 2015-10-29 NOTE — Progress Notes (Signed)
Hematology and Oncology Follow Up Visit  Joseph Tran [id number] [birth year] 65 y.o. 65 y.o. 10/29/2015   Principle Diagnosis:  Thromboembolic disease of the right leg  Current Therapy:   Xarelto 20 mg by mouth daily - decreased to 10 mg daily along with 2 baby aspirin daily today (10/29/15)    Interim History:  Joseph Tran is is here today for a follow-up. He had an US of the right leg this morning which showed suspected progression of mixed echogenic now occlusive thrombus within the distal aspect of the right popliteal vein. He states that he feels a "fullness" in the popliteal area of the right leg and occasionally has swelling in the right ankle. He has not worn his compression stocking for a few weeks.  No fever, chills, n/v, cough, rash, dizziness, SOB, chest pain, abdominal pain or changes in bowel or bladder habits. He has chronic constipation and takes a stool softener and benefiber daily.  He states that he has occasional palpitations and that this is not a new issue.  He has sleep apnea and wears a CPAP at night.  No numbness or tingling in his extremities.  He has maintained a good appetite and is staying well hydrated. His weight is stable.   Medications:    Medication List       This list is accurate as of: 10/29/15  3:59 PM.  Always use your most recent med list.               albuterol 108 (90 Base) MCG/ACT inhaler  Commonly known as:  VENTOLIN HFA  Inhale 2 puffs into the lungs every 4 (four) hours as needed for wheezing or shortness of breath.     buPROPion 300 MG 24 hr tablet  Commonly known as:  WELLBUTRIN XL  TAKE 1 TABLET (300 MG TOTAL) BY MOUTH DAILY.     clonazePAM 1 MG tablet  Commonly known as:  KLONOPIN  Take 1 tablet (1 mg total) by mouth 2 (two) times daily as needed for anxiety.     co-enzyme Q-10 30 MG capsule  Take 100 mg by mouth daily.     diclofenac 75 MG EC tablet  Commonly known as:  VOLTAREN  TAKE 1 TABLET (75 MG TOTAL) BY MOUTH 2 (TWO)  TIMES DAILY.     docusate sodium 100 MG capsule  Commonly known as:  COLACE  Take 100 mg by mouth at bedtime as needed for mild constipation.     Magnesium 250 MG Tabs  Take 500 mg by mouth at bedtime.     Melatonin 5 MG Tabs  Take 1 mg by mouth at bedtime.     multivitamin tablet  Take 1 tablet by mouth daily. For men 50+     OVER THE COUNTER MEDICATION  Take 750 mg by mouth every morning. " GABA "     PRISTIQ 50 MG 24 hr tablet  Generic drug:  desvenlafaxine  TAKE 1 TABLET BY MOUTH EVERY DAY     sildenafil 100 MG tablet  Commonly known as:  VIAGRA  Take 1 tablet (100 mg total) by mouth daily as needed.     tamsulosin 0.4 MG Caps capsule  Commonly known as:  FLOMAX  Take 0.4 mg by mouth daily.     XARELTO 20 MG Tabs tablet  Generic drug:  rivaroxaban  TAKE 1 TABLET (20 MG TOTAL) BY MOUTH DAILY WITH SUPPER.        Allergies:  Allergies  Allergen Reactions  .  Allopurinol   . Dilaudid [Hydromorphone Hcl] Nausea And Vomiting  . Penicillins   . Sulfonamide Derivatives     Past Medical History, Surgical history, Social history, and Family History were reviewed and updated.  Review of Systems: All other 10 point review of systems is negative.   Physical Exam:  height is 6' (1.829 m) and weight is 238 lb (107.956 kg). His oral temperature is 98.5 F (36.9 C). His blood pressure is 136/81 and his pulse is 78. His respiration is 16.   Wt Readings from Last 3 Encounters:  10/29/15 238 lb (107.956 kg)  08/11/15 240 lb (108.863 kg)  08/05/15 238 lb (107.956 kg)    Ocular: Sclerae unicteric, pupils equal, round and reactive to light Ear-nose-throat: Oropharynx clear, dentition fair Lymphatic: No cervical supraclavicular or axillary adenopathy Lungs no rales or rhonchi, good excursion bilaterally Heart regular rate and rhythm, no murmur appreciated Abd soft, nontender, positive bowel sounds, no liver or spleen tip palpated on exam, no fluid wave MSK no focal  spinal tenderness, no joint edema Neuro: non-focal, well-oriented, appropriate affect Breasts: Deferred  Lab Results  Component Value Date   WBC 6.5 10/29/2015   HGB 13.2 10/29/2015   HCT 39.5 10/29/2015   MCV 95 10/29/2015   PLT 185 10/29/2015   No results found for: FERRITIN, IRON, TIBC, UIBC, IRONPCTSAT Lab Results  Component Value Date   RBC 4.15* 10/29/2015   No results found for: KPAFRELGTCHN, LAMBDASER, KAPLAMBRATIO No results found for: IGGSERUM, IGA, IGMSERUM No results found for: Ronnald Ramp, A1GS, A2GS, Tillman Sers, SPEI   Chemistry      Component Value Date/Time   NA 137 06/25/2015 1430   K 4.3 06/25/2015 1430   CL 107 06/25/2015 1430   CO2 24 06/25/2015 1430   BUN 17 06/25/2015 1430   CREATININE 1.30* 06/25/2015 1430      Component Value Date/Time   CALCIUM 9.3 06/25/2015 1430   ALKPHOS 63 06/25/2015 1430   AST 23 06/25/2015 1430   ALT 26 06/25/2015 1430   BILITOT 0.6 06/25/2015 1430     Impression and Plan: Joseph Tran is 65 yo gentleman gentleman with a thrombus in the right leg. His [place] today showed suspected progression of mixed echogenic now occlusive thrombus within the distal aspect of the right popliteal vein. He has some "fullness" in the popliteal area behind the knee when he bends it.  We will decrease his Xarelto to 10 mg daily and have him also start taking 2 baby aspirin daily.  He will start wearing his compression stocking daily.   We will plan to see him back in 3 months for repeat US, lab work and follow-up.  He is in agreement with the plan and will contact us with any questions or concerns. We can certainly see him sooner if need be.   Eliezer Bottom, NP 4/26/20173:59 PM

## 2015-12-03 ENCOUNTER — Other Ambulatory Visit: Payer: Self-pay | Admitting: Family Medicine

## 2015-12-08 ENCOUNTER — Other Ambulatory Visit: Payer: Self-pay | Admitting: Family Medicine

## 2016-01-27 ENCOUNTER — Ambulatory Visit (HOSPITAL_BASED_OUTPATIENT_CLINIC_OR_DEPARTMENT_OTHER)
Admission: RE | Admit: 2016-01-27 | Discharge: 2016-01-27 | Disposition: A | Discharge status: Home / Self Care | Payer: BLUE CROSS/BLUE SHIELD | Source: Ambulatory Visit | Attending: Family | Admitting: Family

## 2016-01-27 DIAGNOSIS — I82891 Chronic embolism and thrombosis of other specified veins: Secondary | ICD-10-CM | POA: Insufficient documentation

## 2016-01-27 DIAGNOSIS — I82511 Chronic embolism and thrombosis of right femoral vein: Secondary | ICD-10-CM | POA: Diagnosis not present

## 2016-01-27 DIAGNOSIS — I82531 Chronic embolism and thrombosis of right popliteal vein: Secondary | ICD-10-CM | POA: Insufficient documentation

## 2016-01-28 ENCOUNTER — Other Ambulatory Visit: Payer: Self-pay | Admitting: Family

## 2016-01-28 ENCOUNTER — Ambulatory Visit (HOSPITAL_BASED_OUTPATIENT_CLINIC_OR_DEPARTMENT_OTHER): Payer: BLUE CROSS/BLUE SHIELD | Admitting: Family

## 2016-01-28 ENCOUNTER — Encounter: Payer: Self-pay | Admitting: Family

## 2016-01-28 ENCOUNTER — Other Ambulatory Visit (HOSPITAL_BASED_OUTPATIENT_CLINIC_OR_DEPARTMENT_OTHER): Payer: BLUE CROSS/BLUE SHIELD

## 2016-01-28 VITALS — BP 134/70 | HR 74 | Temp 98.1°F | Resp 16 | Ht 72.0 in | Wt 227.0 lb

## 2016-01-28 DIAGNOSIS — I82531 Chronic embolism and thrombosis of right popliteal vein: Secondary | ICD-10-CM

## 2016-01-28 DIAGNOSIS — Z7901 Long term (current) use of anticoagulants: Secondary | ICD-10-CM

## 2016-01-28 DIAGNOSIS — Z7982 Long term (current) use of aspirin: Secondary | ICD-10-CM | POA: Diagnosis not present

## 2016-01-28 LAB — COMPREHENSIVE METABOLIC PANEL (CC13)
A/G RATIO: 1.8 (ref 1.2–2.2)
ALK PHOS: 80 IU/L (ref 39–117)
ALT: 28 IU/L (ref 0–44)
AST (SGOT): 22 IU/L (ref 0–40)
Albumin, Serum: 4.2 g/dL (ref 3.6–4.8)
BILIRUBIN TOTAL: 0.3 mg/dL (ref 0.0–1.2)
BUN / CREAT RATIO: 11 (ref 10–24)
BUN: 21 mg/dL (ref 8–27)
CHLORIDE: 109 mmol/L — AB (ref 96–106)
Calcium, Ser: 9.4 mg/dL (ref 8.6–10.2)
Carbon Dioxide, Total: 23 mmol/L (ref 18–29)
Creatinine, Ser: 1.87 mg/dL — ABNORMAL HIGH (ref 0.76–1.27)
GFR calc Af Amer: 43 mL/min/{1.73_m2} — ABNORMAL LOW (ref >59)
GFR calc non Af Amer: 37 mL/min/{1.73_m2} — ABNORMAL LOW (ref >59)
GLUCOSE: 112 mg/dL — AB (ref 65–99)
Globulin, Total: 2.4 g/dL (ref 1.5–4.5)
POTASSIUM: 4.7 mmol/L (ref 3.5–5.2)
Sodium: 141 mmol/L (ref 134–144)
Total Protein: 6.6 g/dL (ref 6.0–8.5)

## 2016-01-28 LAB — CBC WITH DIFFERENTIAL (CANCER CENTER ONLY)
BASO#: 0.1 10*3/uL (ref 0.0–0.2)
BASO%: 0.9 % (ref 0.0–2.0)
EOS ABS: 0.2 10*3/uL (ref 0.0–0.5)
EOS%: 4.1 % (ref 0.0–7.0)
HCT: 38.6 % — ABNORMAL LOW (ref 38.7–49.9)
HGB: 12.9 g/dL — ABNORMAL LOW (ref 13.0–17.1)
LYMPH#: 1.2 10*3/uL (ref 0.9–3.3)
LYMPH%: 21.3 % (ref 14.0–48.0)
MCH: 32.3 pg (ref 28.0–33.4)
MCHC: 33.4 g/dL (ref 32.0–35.9)
MCV: 97 fL (ref 82–98)
MONO#: 0.3 10*3/uL (ref 0.1–0.9)
MONO%: 5.4 % (ref 0.0–13.0)
NEUT#: 3.7 10*3/uL (ref 1.5–6.5)
NEUT%: 68.3 % (ref 40.0–80.0)
PLATELETS: 188 10*3/uL (ref 145–400)
RBC: 3.99 10*6/uL — ABNORMAL LOW (ref 4.20–5.70)
RDW: 12.4 % (ref 11.1–15.7)
WBC: 5.4 10*3/uL (ref 4.0–10.0)

## 2016-01-28 NOTE — Progress Notes (Signed)
Hematology and Oncology Follow Up Visit  HADYN TIKKANEN [id number] Jun 06, 1951 65 y.o. 01/28/2016   Principle Diagnosis:  Thromboembolic disease of the right leg  Current Therapy:   Xarelto 20 mg by mouth daily - decreased to 10 mg daily along with 2 baby aspirin daily today (10/29/15)    Interim History:  Mr. Litten is is here today for a follow-up. 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  He is eating healthier and staying active. He has lost 11 lbs since his last visit with [place] and is quite excited about this.  The swelling in his right leg minimal, pedal pulse is +2. No numbness or tingling in his extremities.  [place] today showed stable chronic nonocclusive wall thickening/DVT within the femoral and popliteal veins and stable chronic nearly occlusive superficial thrombus of the right gastrocnemius. He is taking his 2 baby aspirin and Xarelto daily as prescribed. No episodes of bleeding or bruising.  He wears a compression stocking daily.  No fever, chills, n/v, cough, rash, dizziness, SOB, chest pain, abdominal pain or changes in bowel or bladder habits.  He still has occasional palpitations which is not a new issue.  He has sleep apnea and wears a CPAP at night.  He has maintained a good appetite and is staying well hydrated. His weight is stable.   Medications:    Medication List       Accurate as of 01/28/16  4:07 PM. Always use your most recent med list.          buPROPion 300 MG 24 hr tablet Commonly known as:  WELLBUTRIN XL TAKE 1 TABLET (300 MG TOTAL) BY MOUTH DAILY.   clonazePAM 1 MG tablet Commonly known as:  KLONOPIN Take 1 tablet (1 mg total) by mouth 2 (two) times daily as needed for anxiety.   co-enzyme Q-10 30 MG capsule Take 100 mg by mouth daily.   diclofenac 75 MG EC tablet Commonly known as:  VOLTAREN TAKE 1 TABLET (75 MG TOTAL) BY MOUTH 2 (TWO) TIMES DAILY.     docusate sodium 100 MG capsule Commonly known as:  COLACE Take 100 mg by mouth at bedtime as needed for mild constipation.   Magnesium 250 MG Tabs Take 500 mg by mouth at bedtime.   Melatonin 5 MG Tabs Take 1 mg by mouth at bedtime.   multivitamin tablet Take 1 tablet by mouth daily. For men 50+   OVER THE COUNTER MEDICATION Take 750 mg by mouth every morning. " GABA "   PRISTIQ 50 MG 24 hr tablet Generic drug:  desvenlafaxine TAKE 1 TABLET BY MOUTH EVERY DAY   sildenafil 100 MG tablet Commonly known as:  VIAGRA Take 1 tablet (100 mg total) by mouth daily as needed.   tamsulosin 0.4 MG Caps capsule Commonly known as:  FLOMAX Take 0.4 mg by mouth daily.   VENTOLIN HFA 108 (90 Base) MCG/ACT inhaler Generic drug:  albuterol INHALE 2 PUFFS INTO THE LUNGS EVERY 4 (FOUR) HOURS AS NEEDED FOR WHEEZING OR SHORTNESS OF BREATH.   XARELTO 20 MG Tabs tablet Generic drug:  rivaroxaban TAKE 1 TABLET (20 MG TOTAL) BY MOUTH DAILY WITH SUPPER.       Allergies:  Allergies  Allergen Reactions  . Allopurinol   . Dilaudid [Hydromorphone Hcl] Nausea And Vomiting  . Penicillins   . Sulfonamide Derivatives     Past Medical History, Surgical  history, Social history, and Family History were reviewed and updated.  Review of Systems: All other 10 point review of systems is negative.   Physical Exam:  height is 6' (1.829 m) and weight is 227 lb (103 kg). His oral temperature is 98.1 F (36.7 C). His blood pressure is 134/70 and his pulse is 74. His respiration is 16.   Wt Readings from Last 3 Encounters:  01/28/16 227 lb (103 kg)  10/29/15 238 lb (108 kg)  08/11/15 240 lb (108.9 kg)    Ocular: Sclerae unicteric, pupils equal, round and reactive to light Ear-nose-throat: Oropharynx clear, dentition fair Lymphatic: No cervical supraclavicular or axillary adenopathy Lungs no rales or rhonchi, good excursion bilaterally Heart regular rate and rhythm, no murmur appreciated Abd  soft, nontender, positive bowel sounds, no liver or spleen tip palpated on exam, no fluid wave MSK no focal spinal tenderness, no joint edema Neuro: non-focal, well-oriented, appropriate affect Breasts: Deferred  Lab Results  Component Value Date   WBC 5.4 01/28/2016   HGB 12.9 (L) 01/28/2016   HCT 38.6 (L) 01/28/2016   MCV 97 01/28/2016   PLT 188 01/28/2016   No results found for: FERRITIN, IRON, TIBC, UIBC, IRONPCTSAT Lab Results  Component Value Date   RBC 3.99 (L) 01/28/2016   No results found for: KPAFRELGTCHN, LAMBDASER, KAPLAMBRATIO No results found for: IGGSERUM, IGA, IGMSERUM No results found for: Ronnald Ramp, A1GS, A2GS, Tillman Sers, SPEI   Chemistry      Component Value Date/Time   NA 139 10/29/2015 1442   K 4.6 10/29/2015 1442   CL 108 (H) 10/29/2015 1442   CO2 23 10/29/2015 1442   BUN 25 10/29/2015 1442   CREATININE 2.10 (H) 10/29/2015 1442      Component Value Date/Time   CALCIUM 9.8 10/29/2015 1442   ALKPHOS 86 10/29/2015 1442   AST 25 10/29/2015 1442   ALT 24 10/29/2015 1442   BILITOT 0.4 10/29/2015 1442     Impression and Plan: Mr. 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 He is doing well and has no complaints at this time. He is doing well on Xarelto 10 mg and 2 baby aspirin daily. He will continue on this same regimen.  We will plan to see him back in 4 months for repeat lab work and follow-up.  He will contact us with any questions or concerns. We can certainly see him sooner if need be.   Eliezer Bottom, NP 7/26/20174:07 PM

## 2016-03-17 ENCOUNTER — Other Ambulatory Visit: Payer: Self-pay | Admitting: Family Medicine

## 2016-03-17 NOTE — Telephone Encounter (Signed)
Call in #[phone] with one rf  

## 2016-03-19 NOTE — Telephone Encounter (Signed)
Rx called in 

## 2016-04-22 ENCOUNTER — Other Ambulatory Visit (INDEPENDENT_AMBULATORY_CARE_PROVIDER_SITE_OTHER): Payer: BLUE CROSS/BLUE SHIELD

## 2016-04-22 DIAGNOSIS — Z0000 Encounter for general adult medical examination without abnormal findings: Secondary | ICD-10-CM | POA: Diagnosis not present

## 2016-04-22 LAB — BASIC METABOLIC PANEL
BUN: 40 mg/dL — AB (ref 6–23)
CALCIUM: 9.4 mg/dL (ref 8.4–10.5)
CO2: 25 mEq/L (ref 19–32)
CREATININE: 2.1 mg/dL — AB (ref 0.40–1.50)
Chloride: 111 mEq/L (ref 96–112)
GFR: 33.87 mL/min — AB (ref >60.00)
GLUCOSE: 89 mg/dL (ref 70–99)
Potassium: 4.8 mEq/L (ref 3.5–5.1)
Sodium: 142 mEq/L (ref 135–145)

## 2016-04-22 LAB — PSA: PSA: 4.42 ng/mL — ABNORMAL HIGH (ref 0.10–4.00)

## 2016-04-22 LAB — CBC WITH DIFFERENTIAL/PLATELET
BASOS PCT: 1 % (ref 0.0–3.0)
Basophils Absolute: 0.1 10*3/uL (ref 0.0–0.1)
EOS ABS: 0.2 10*3/uL (ref 0.0–0.7)
EOS PCT: 3.5 % (ref 0.0–5.0)
HEMATOCRIT: 37.2 % — AB (ref 39.0–52.0)
HEMOGLOBIN: 12.5 g/dL — AB (ref 13.0–17.0)
LYMPHS PCT: 18.2 % (ref 12.0–46.0)
Lymphs Abs: 1.1 10*3/uL (ref 0.7–4.0)
MCHC: 33.7 g/dL (ref 30.0–36.0)
MCV: 92.4 fl (ref 78.0–100.0)
Monocytes Absolute: 0.4 10*3/uL (ref 0.1–1.0)
Monocytes Relative: 6.5 % (ref 3.0–12.0)
NEUTROS ABS: 4.5 10*3/uL (ref 1.4–7.7)
Neutrophils Relative %: 70.8 % (ref 43.0–77.0)
PLATELETS: 217 10*3/uL (ref 150.0–400.0)
RBC: 4.03 Mil/uL — ABNORMAL LOW (ref 4.22–5.81)
RDW: 13.2 % (ref 11.5–15.5)
WBC: 6.3 10*3/uL (ref 4.0–10.5)

## 2016-04-22 LAB — POC URINALSYSI DIPSTICK (AUTOMATED)
BILIRUBIN UA: NEGATIVE
GLUCOSE UA: NEGATIVE
Ketones, UA: NEGATIVE
Leukocytes, UA: NEGATIVE
Nitrite, UA: NEGATIVE
RBC UA: NEGATIVE
SPEC GRAV UA: 1.025
Urobilinogen, UA: 0.2
pH, UA: 5

## 2016-04-22 LAB — TSH: TSH: 2.42 u[IU]/mL (ref 0.35–4.50)

## 2016-04-22 LAB — HEPATIC FUNCTION PANEL
ALBUMIN: 4 g/dL (ref 3.5–5.2)
ALT: 20 U/L (ref 0–53)
AST: 18 U/L (ref 0–37)
Alkaline Phosphatase: 72 U/L (ref 39–117)
BILIRUBIN TOTAL: 0.4 mg/dL (ref 0.2–1.2)
Bilirubin, Direct: 0.1 mg/dL (ref 0.0–0.3)
Total Protein: 6.3 g/dL (ref 6.0–8.3)

## 2016-04-22 LAB — LIPID PANEL
CHOLESTEROL: 198 mg/dL (ref 0–200)
HDL: 38.5 mg/dL — ABNORMAL LOW (ref >39.00)
LDL CALC: 135 mg/dL — AB (ref 0–99)
NonHDL: 159.9
TRIGLYCERIDES: 126 mg/dL (ref 0.0–149.0)
Total CHOL/HDL Ratio: 5
VLDL: 25.2 mg/dL (ref 0.0–40.0)

## 2016-04-28 ENCOUNTER — Encounter: Payer: Self-pay | Admitting: Family Medicine

## 2016-04-28 ENCOUNTER — Telehealth: Payer: Self-pay | Admitting: Family Medicine

## 2016-04-28 ENCOUNTER — Ambulatory Visit (INDEPENDENT_AMBULATORY_CARE_PROVIDER_SITE_OTHER): Payer: BLUE CROSS/BLUE SHIELD | Admitting: Family Medicine

## 2016-04-28 VITALS — BP 130/83 | HR 74 | Temp 97.9°F | Ht 72.0 in | Wt 232.0 lb

## 2016-04-28 DIAGNOSIS — Z0000 Encounter for general adult medical examination without abnormal findings: Secondary | ICD-10-CM

## 2016-04-28 DIAGNOSIS — R972 Elevated prostate specific antigen [PSA]: Secondary | ICD-10-CM

## 2016-04-28 DIAGNOSIS — Z23 Encounter for immunization: Secondary | ICD-10-CM | POA: Diagnosis not present

## 2016-04-28 NOTE — Progress Notes (Signed)
   Subjective:    Patient ID: Joseph Tran, male    DOB: 1950/07/07, 65 y.o.   MRN: [id number]  HPI 65 yr old male for a well exam. He feels fine in general but he has chronic right knee pain. He has had arthroscopy on this knee twice, the most recent in 2013. He wears a brace at times for this. Diclofenac helps with pain. He is taking Xarelto for a leg DVT.    Review of Systems  Constitutional: Negative.   HENT: Negative.   Eyes: Negative.   Respiratory: Negative.   Cardiovascular: Negative.   Gastrointestinal: Negative.   Genitourinary: Negative.   Musculoskeletal: Negative.   Skin: Negative.   Neurological: Negative.   Psychiatric/Behavioral: Negative.        Objective:   Physical Exam  Constitutional: He is oriented to person, place, and time. He appears well-developed and well-nourished. No distress.  HENT:  Head: Normocephalic and atraumatic.  Right Ear: External ear normal.  Left Ear: External ear normal.  Nose: Nose normal.  Mouth/Throat: Oropharynx is clear and moist. No oropharyngeal exudate.  Eyes: Conjunctivae and EOM are normal. Pupils are equal, round, and reactive to light. Right eye exhibits no discharge. Left eye exhibits no discharge. No scleral icterus.  Neck: Neck supple. No JVD present. No tracheal deviation present. No thyromegaly present.  Cardiovascular: Normal rate, regular rhythm, normal heart sounds and intact distal pulses.  Exam reveals no gallop and no friction rub.   No murmur heard. EKG normal  Pulmonary/Chest: Effort normal and breath sounds normal. No respiratory distress. He has no wheezes. He has no rales. He exhibits no tenderness.  Abdominal: Soft. Bowel sounds are normal. He exhibits no distension and no mass. There is no tenderness. There is no rebound and no guarding.  Genitourinary: Rectum normal, prostate normal and penis normal. Rectal exam shows guaiac negative stool. No penile tenderness.  Musculoskeletal: Normal range of motion.  He exhibits no edema or tenderness.  Lymphadenopathy:    He has no cervical adenopathy.  Neurological: He is alert and oriented to person, place, and time. He has normal reflexes. No cranial nerve deficit. He exhibits normal muscle tone. Coordination normal.  Skin: Skin is warm and dry. No rash noted. He is not diaphoretic. No erythema. No pallor.  Psychiatric: He has a normal mood and affect. His behavior is normal. Judgment and thought content normal.          Assessment & Plan:  Well exam. We discussed diet and exercise. He will return to Orthopedics for the knee pain as needed. His PSA has risen to 4.42 so we will refer him to Urology to evaluate.  [name], MD

## 2016-04-28 NOTE — Progress Notes (Signed)
Pre visit review using our clinic review tool, if applicable. No additional management support is needed unless otherwise documented below in the visit note. 

## 2016-04-29 ENCOUNTER — Encounter: Payer: BLUE CROSS/BLUE SHIELD | Admitting: Family Medicine

## 2016-04-29 NOTE — Telephone Encounter (Signed)
error 

## 2016-05-19 ENCOUNTER — Ambulatory Visit (HOSPITAL_BASED_OUTPATIENT_CLINIC_OR_DEPARTMENT_OTHER)
Admission: RE | Admit: 2016-05-19 | Discharge: 2016-05-19 | Disposition: A | Discharge status: Home / Self Care | Payer: BLUE CROSS/BLUE SHIELD | Source: Ambulatory Visit | Attending: Hematology & Oncology | Admitting: Hematology & Oncology

## 2016-05-19 ENCOUNTER — Other Ambulatory Visit (HOSPITAL_BASED_OUTPATIENT_CLINIC_OR_DEPARTMENT_OTHER): Payer: BLUE CROSS/BLUE SHIELD

## 2016-05-19 ENCOUNTER — Ambulatory Visit (HOSPITAL_BASED_OUTPATIENT_CLINIC_OR_DEPARTMENT_OTHER): Payer: BLUE CROSS/BLUE SHIELD | Admitting: Hematology & Oncology

## 2016-05-19 VITALS — BP 147/86 | HR 74 | Temp 97.9°F | Resp 20 | Wt 235.1 lb

## 2016-05-19 DIAGNOSIS — Z7982 Long term (current) use of aspirin: Secondary | ICD-10-CM

## 2016-05-19 DIAGNOSIS — K5900 Constipation, unspecified: Secondary | ICD-10-CM

## 2016-05-19 DIAGNOSIS — I82401 Acute embolism and thrombosis of unspecified deep veins of right lower extremity: Secondary | ICD-10-CM

## 2016-05-19 DIAGNOSIS — I82531 Chronic embolism and thrombosis of right popliteal vein: Secondary | ICD-10-CM | POA: Diagnosis not present

## 2016-05-19 DIAGNOSIS — Z7901 Long term (current) use of anticoagulants: Secondary | ICD-10-CM

## 2016-05-19 DIAGNOSIS — I82511 Chronic embolism and thrombosis of right femoral vein: Secondary | ICD-10-CM

## 2016-05-19 LAB — CBC WITH DIFFERENTIAL (CANCER CENTER ONLY)
BASO#: 0.1 10*3/uL (ref 0.0–0.2)
BASO%: 0.9 % (ref 0.0–2.0)
EOS%: 3.5 % (ref 0.0–7.0)
Eosinophils Absolute: 0.2 10*3/uL (ref 0.0–0.5)
HCT: 39.1 % (ref 38.7–49.9)
HEMOGLOBIN: 13.1 g/dL (ref 13.0–17.1)
LYMPH#: 1.3 10*3/uL (ref 0.9–3.3)
LYMPH%: 19.3 % (ref 14.0–48.0)
MCH: 31.6 pg (ref 28.0–33.4)
MCHC: 33.5 g/dL (ref 32.0–35.9)
MCV: 94 fL (ref 82–98)
MONO#: 0.4 10*3/uL (ref 0.1–0.9)
MONO%: 5.3 % (ref 0.0–13.0)
NEUT%: 71 % (ref 40.0–80.0)
NEUTROS ABS: 4.7 10*3/uL (ref 1.5–6.5)
Platelets: 204 10*3/uL (ref 145–400)
RBC: 4.15 10*6/uL — AB (ref 4.20–5.70)
RDW: 12.3 % (ref 11.1–15.7)
WBC: 6.6 10*3/uL (ref 4.0–10.0)

## 2016-05-19 LAB — CMP (CANCER CENTER ONLY)
ALBUMIN: 3.3 g/dL (ref 3.3–5.5)
ALT(SGPT): 25 U/L (ref 10–47)
AST: 25 U/L (ref 11–38)
Alkaline Phosphatase: 75 U/L (ref 26–84)
BILIRUBIN TOTAL: 0.6 mg/dL (ref 0.20–1.60)
BUN, Bld: 20 mg/dL (ref 7–22)
CO2: 24 meq/L (ref 18–33)
CREATININE: 2.1 mg/dL — AB (ref 0.6–1.2)
Calcium: 9.4 mg/dL (ref 8.0–10.3)
Chloride: 108 mEq/L (ref 98–108)
Glucose, Bld: 85 mg/dL (ref 73–118)
Potassium: 4.3 mEq/L (ref 3.3–4.7)
SODIUM: 141 meq/L (ref 128–145)
TOTAL PROTEIN: 6.5 g/dL (ref 6.4–8.1)

## 2016-05-19 NOTE — Progress Notes (Signed)
Hematology and Oncology Follow Up Visit  HASSELL BERNAL [id number] [birth year] 65 y.o. 05/19/2016   Principle Diagnosis:  Thromboembolic disease of the right leg  Current Therapy:   Xarelto  10 mg daily along with 2 baby aspirin daily today (10/29/15)    Interim History:  [name] is is here today for a follow-up. He is having some problems with his right thigh. He says that he has occasional throbbing in the thigh. He says it is not swollen. There is no weakness.  He is having problems with his right knee. It sounds like he has a meniscal tear .I'm unsure if he has seen his orthopedist for this.   He is doing well on his Xarelto along with aspirin. He's had no abdominal pain. He's had no cough or shortness of breath.   He is chronically constipated. I will try him on some lactulose. I'm unsure if he sees a gastroenterologist.   He's had no bleeding. He's had no bruising. He has maintained a good appetite and is staying well hydrated. His weight is stable.   Overall, I say his performance status is probably ECOG 1.  Medications:    Medication List       Accurate as of 05/19/16  5:50 PM. Always use your most recent med list.          aspirin EC 81 MG tablet Take 162 mg by mouth daily.   buPROPion 300 MG 24 hr tablet Commonly known as:  WELLBUTRIN XL TAKE 1 TABLET (300 MG TOTAL) BY MOUTH DAILY.   clonazePAM 1 MG tablet Commonly known as:  KLONOPIN TAKE 1 TABLET BY MOUTH TWICE A DAY AS NEEDED FOR ANXIETY   co-enzyme Q-10 30 MG capsule Take 100 mg by mouth daily.   diclofenac 75 MG EC tablet Commonly known as:  VOLTAREN TAKE 1 TABLET (75 MG TOTAL) BY MOUTH 2 (TWO) TIMES DAILY.   docusate sodium 100 MG capsule Commonly known as:  COLACE Take 100 mg by mouth at bedtime as needed for mild constipation.   Fish Oil 1000 MG Caps Take by mouth 2 (two) times daily.   IRON-150 PO Take 150 mg by mouth daily.   Magnesium 250 MG Tabs Take 500 mg by mouth at bedtime.   Melatonin 1 MG Caps Take 2 mg by mouth at bedtime.   multivitamin tablet Take 1 tablet by mouth daily. For men 50+   OVER THE COUNTER MEDICATION Take 750 mg by mouth every morning. " GABA "   PRISTIQ 50 MG 24 hr tablet Generic drug:  desvenlafaxine TAKE 1 TABLET BY MOUTH EVERY DAY   sildenafil 100 MG tablet Commonly known as:  VIAGRA Take 1 tablet (100 mg total) by mouth daily as needed.   tamsulosin 0.4 MG Caps capsule Commonly known as:  FLOMAX Take 0.4 mg by mouth daily.   VENTOLIN HFA 108 (90 Base) MCG/ACT inhaler Generic drug:  albuterol INHALE 2 PUFFS INTO THE LUNGS EVERY 4 (FOUR) HOURS AS NEEDED FOR WHEEZING OR SHORTNESS OF BREATH.   XARELTO 20 MG Tabs tablet Generic drug:  rivaroxaban TAKE 1 TABLET (20 MG TOTAL) BY MOUTH DAILY WITH SUPPER.       Allergies:  Allergies  Allergen Reactions  . Allopurinol   . Dilaudid [Hydromorphone Hcl] Nausea And Vomiting  . Penicillins   . Sulfonamide Derivatives     Past Medical History, Surgical history, Social history, and Family History were reviewed and updated.  Review of Systems: All other 10 point  review of systems is negative.   Physical Exam:  weight is 235 lb 1.9 oz (106.6 kg). His oral temperature is 97.9 F (36.6 C). His blood pressure is 147/86 (abnormal) and his pulse is 74. His respiration is 20.   Wt Readings from Last 3 Encounters:  05/19/16 235 lb 1.9 oz (106.6 kg)  04/28/16 232 lb (105.2 kg)  01/28/16 227 lb (103 kg)    Somewhat obese white male in no obvious distress. Head exam shows no ocular or oral lesions. He has no adenopathy in the neck. Lungs are clear. Cardiac exam regular rate and rhythm with no murmurs rubs or bruits. Abdomen is soft. He has good bowel sounds. There is no fluid wave. There is no palpable liver or spleen tip. Back exam shows no tenderness over the spine, ribs or hips. Extremities shows no clubbing, cyanosis or edema. He has good range of motion of his joints. He has no  venous cord in his legs. He has no Homans sign. Skin exam does show what appears to be some actinic keratoses in his upper back. He has 2 lesions. They are flat. They're somewhat erythematous. They are just to the left of the midline.   Lab Results  Component Value Date   WBC 6.6 05/19/2016   HGB 13.1 05/19/2016   HCT 39.1 05/19/2016   MCV 94 05/19/2016   PLT 204 05/19/2016   No results found for: FERRITIN, IRON, TIBC, UIBC, IRONPCTSAT Lab Results  Component Value Date   RBC 4.15 (L) 05/19/2016   No results found for: KPAFRELGTCHN, LAMBDASER, KAPLAMBRATIO No results found for: IGGSERUM, IGA, IGMSERUM No results found for: Ronnald Ramp, A1GS, A2GS, Tillman Sers, SPEI   Chemistry      Component Value Date/Time   NA 141 05/19/2016 1524   K 4.3 05/19/2016 1524   CL 108 05/19/2016 1524   CO2 24 05/19/2016 1524   BUN 20 05/19/2016 1524   CREATININE 2.1 (H) 05/19/2016 1524      Component Value Date/Time   CALCIUM 9.4 05/19/2016 1524   ALKPHOS 75 05/19/2016 1524   AST 25 05/19/2016 1524   ALT 25 05/19/2016 1524   BILITOT 0.60 05/19/2016 1524     Impression and Plan: Mr. 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   I just wonder if the sensation in his right leg might be from the chronic thrombus. We will go ahead and get another Doppler of his right leg. I would not imagine that he buys a chronic thrombus. I would not think that it is occlusive. Unfortunately,, I really do not know what we can really do with this.   He will see his dermatologist for the lesions on his upper back.  I will plan to see him back in about a month or so just to follow-up.  Volanda Napoleon, MD 11/15/20175:50 PM

## 2016-05-20 ENCOUNTER — Other Ambulatory Visit: Payer: Self-pay | Admitting: *Deleted

## 2016-05-20 MED ORDER — LACTULOSE 20 GM/30ML PO SOLN: 30.0000 mL | Freq: Three times a day (TID) | ORAL | 0 refills | Status: AC

## 2016-05-21 ENCOUNTER — Ambulatory Visit (HOSPITAL_BASED_OUTPATIENT_CLINIC_OR_DEPARTMENT_OTHER)
Admission: RE | Admit: 2016-05-21 | Discharge: 2016-05-21 | Disposition: A | Discharge status: Home / Self Care | Payer: BLUE CROSS/BLUE SHIELD | Source: Ambulatory Visit | Attending: Hematology & Oncology | Admitting: Hematology & Oncology

## 2016-05-21 DIAGNOSIS — I82401 Acute embolism and thrombosis of unspecified deep veins of right lower extremity: Secondary | ICD-10-CM | POA: Diagnosis present

## 2016-06-01 ENCOUNTER — Telehealth: Payer: Self-pay

## 2016-06-01 NOTE — Telephone Encounter (Addendum)
-----   Message from Volanda Napoleon, MD sent at 05/31/2016  1:23 PM EST ----- Call - the thrombus is about the same. Keep on the Xarelto/aspirin combination.  Are you wearing a compression stocking for that leg??  pete  Above message given to pt via phone. Pt verbalizes understanding and confirms he is wearing his compression stocking. Pt states "I've even got it on right now."

## 2016-06-17 ENCOUNTER — Ambulatory Visit (HOSPITAL_BASED_OUTPATIENT_CLINIC_OR_DEPARTMENT_OTHER): Payer: Medicare Other | Admitting: Hematology & Oncology

## 2016-06-17 ENCOUNTER — Other Ambulatory Visit (HOSPITAL_BASED_OUTPATIENT_CLINIC_OR_DEPARTMENT_OTHER): Payer: Medicare Other

## 2016-06-17 VITALS — BP 128/77 | HR 80 | Temp 98.2°F | Resp 18 | Wt 239.4 lb

## 2016-06-17 DIAGNOSIS — I82401 Acute embolism and thrombosis of unspecified deep veins of right lower extremity: Secondary | ICD-10-CM

## 2016-06-17 DIAGNOSIS — I825Y1 Chronic embolism and thrombosis of unspecified deep veins of right proximal lower extremity: Secondary | ICD-10-CM

## 2016-06-17 DIAGNOSIS — Z7901 Long term (current) use of anticoagulants: Secondary | ICD-10-CM

## 2016-06-17 DIAGNOSIS — I82811 Embolism and thrombosis of superficial veins of right lower extremities: Secondary | ICD-10-CM | POA: Diagnosis not present

## 2016-06-17 DIAGNOSIS — I82531 Chronic embolism and thrombosis of right popliteal vein: Secondary | ICD-10-CM

## 2016-06-17 DIAGNOSIS — I82511 Chronic embolism and thrombosis of right femoral vein: Secondary | ICD-10-CM | POA: Diagnosis not present

## 2016-06-17 LAB — COMPREHENSIVE METABOLIC PANEL (CC13)
ALT: 25 IU/L (ref 0–44)
AST (SGOT): 21 IU/L (ref 0–40)
Albumin, Serum: 4.2 g/dL (ref 3.6–4.8)
Albumin/Globulin Ratio: 1.4 (ref 1.2–2.2)
Alkaline Phosphatase, S: 88 IU/L (ref 39–117)
BUN/Creatinine Ratio: 14 (ref 10–24)
BUN: 28 mg/dL — ABNORMAL HIGH (ref 8–27)
Bilirubin Total: 0.3 mg/dL (ref 0.0–1.2)
CALCIUM: 9.6 mg/dL (ref 8.6–10.2)
CREATININE: 1.98 mg/dL — AB (ref 0.76–1.27)
Carbon Dioxide, Total: 25 mmol/L (ref 18–29)
Chloride, Ser: 107 mmol/L — ABNORMAL HIGH (ref 96–106)
GFR, EST AFRICAN AMERICAN: 40 mL/min/{1.73_m2} — AB (ref >59)
GFR, EST NON AFRICAN AMERICAN: 34 mL/min/{1.73_m2} — AB (ref >59)
GLUCOSE: 95 mg/dL (ref 65–99)
Globulin, Total: 3.1 g/dL (ref 1.5–4.5)
Potassium, Ser: 4.5 mmol/L (ref 3.5–5.2)
Sodium: 140 mmol/L (ref 134–144)
TOTAL PROTEIN: 7.3 g/dL (ref 6.0–8.5)

## 2016-06-17 LAB — CBC WITH DIFFERENTIAL (CANCER CENTER ONLY)
BASO#: 0 10*3/uL (ref 0.0–0.2)
BASO%: 0.4 % (ref 0.0–2.0)
EOS ABS: 0.2 10*3/uL (ref 0.0–0.5)
EOS%: 3.4 % (ref 0.0–7.0)
HEMATOCRIT: 40.6 % (ref 38.7–49.9)
HGB: 13.8 g/dL (ref 13.0–17.1)
LYMPH#: 1.2 10*3/uL (ref 0.9–3.3)
LYMPH%: 16.8 % (ref 14.0–48.0)
MCH: 31.8 pg (ref 28.0–33.4)
MCHC: 34 g/dL (ref 32.0–35.9)
MCV: 94 fL (ref 82–98)
MONO#: 0.3 10*3/uL (ref 0.1–0.9)
MONO%: 4.9 % (ref 0.0–13.0)
NEUT#: 5.2 10*3/uL (ref 1.5–6.5)
NEUT%: 74.5 % (ref 40.0–80.0)
Platelets: 219 10*3/uL (ref 145–400)
RBC: 4.34 10*6/uL (ref 4.20–5.70)
RDW: 12 % (ref 11.1–15.7)
WBC: 7 10*3/uL (ref 4.0–10.0)

## 2016-06-18 LAB — D-DIMER, QUANTITATIVE: D-DIMER: 0.4 mg/L FEU (ref 0.00–0.49)

## 2016-06-21 DIAGNOSIS — Z6831 Body mass index (BMI) 31.0-31.9, adult: Secondary | ICD-10-CM | POA: Diagnosis not present

## 2016-06-21 DIAGNOSIS — N401 Enlarged prostate with lower urinary tract symptoms: Secondary | ICD-10-CM | POA: Diagnosis not present

## 2016-06-21 DIAGNOSIS — R972 Elevated prostate specific antigen [PSA]: Secondary | ICD-10-CM | POA: Diagnosis not present

## 2016-06-21 DIAGNOSIS — N138 Other obstructive and reflux uropathy: Secondary | ICD-10-CM | POA: Diagnosis not present

## 2016-06-22 DIAGNOSIS — R972 Elevated prostate specific antigen [PSA]: Secondary | ICD-10-CM | POA: Diagnosis not present

## 2016-06-24 DIAGNOSIS — L814 Other melanin hyperpigmentation: Secondary | ICD-10-CM | POA: Diagnosis not present

## 2016-06-24 DIAGNOSIS — D485 Neoplasm of uncertain behavior of skin: Secondary | ICD-10-CM | POA: Diagnosis not present

## 2016-06-24 DIAGNOSIS — C44519 Basal cell carcinoma of skin of other part of trunk: Secondary | ICD-10-CM | POA: Diagnosis not present

## 2016-06-24 DIAGNOSIS — C44619 Basal cell carcinoma of skin of left upper limb, including shoulder: Secondary | ICD-10-CM | POA: Diagnosis not present

## 2016-06-24 DIAGNOSIS — D1801 Hemangioma of skin and subcutaneous tissue: Secondary | ICD-10-CM | POA: Diagnosis not present

## 2016-06-24 DIAGNOSIS — L821 Other seborrheic keratosis: Secondary | ICD-10-CM | POA: Diagnosis not present

## 2016-06-24 DIAGNOSIS — L570 Actinic keratosis: Secondary | ICD-10-CM | POA: Diagnosis not present

## 2016-07-12 DIAGNOSIS — N401 Enlarged prostate with lower urinary tract symptoms: Secondary | ICD-10-CM | POA: Diagnosis not present

## 2016-07-12 DIAGNOSIS — N138 Other obstructive and reflux uropathy: Secondary | ICD-10-CM | POA: Diagnosis not present

## 2016-07-12 DIAGNOSIS — R972 Elevated prostate specific antigen [PSA]: Secondary | ICD-10-CM | POA: Diagnosis not present

## 2016-07-12 DIAGNOSIS — Z6831 Body mass index (BMI) 31.0-31.9, adult: Secondary | ICD-10-CM | POA: Diagnosis not present

## 2016-07-12 DIAGNOSIS — N529 Male erectile dysfunction, unspecified: Secondary | ICD-10-CM | POA: Diagnosis not present

## 2016-07-12 DIAGNOSIS — N200 Calculus of kidney: Secondary | ICD-10-CM | POA: Diagnosis not present

## 2016-08-05 DIAGNOSIS — C44519 Basal cell carcinoma of skin of other part of trunk: Secondary | ICD-10-CM | POA: Diagnosis not present

## 2016-08-10 NOTE — Progress Notes (Signed)
Hematology and Oncology Follow Up Visit  Joseph Tran [id number] 1950-12-23 66 y.o. 08/10/2016   Principle Diagnosis:  Thromboembolic disease of the right leg  Current Therapy:   Xarelto  10 mg daily along with 2 baby aspirin daily Tran (10/29/15)    Interim History:  Joseph Tran for a follow-up. He is having some problems with his right thigh. He says that he has occasional throbbing in the thigh. He says it is not swollen. There is no weakness.  He is having problems with his right knee. It sounds like he has a meniscal tear .I'm unsure if he has seen his orthopedist for this.   He is doing well on his Xarelto along with aspirin. He's had no abdominal pain. He's had no cough or shortness of breath.   He is chronically constipated. I will try him on some lactulose. I'm unsure if he sees a gastroenterologist.   He's had no bleeding. He's had no bruising. He has maintained a good appetite and is staying well hydrated. His weight is stable.   Overall, I say his performance status is probably ECOG 1.  Medications:  Allergies as of 06/17/2016      Reactions   Allopurinol    Dilaudid [hydromorphone Hcl] Nausea And Vomiting   Penicillins    Sulfonamide Derivatives       Medication List       Accurate as of 06/17/16 11:59 PM. Always use your most recent med list.          aspirin EC 81 MG tablet Take 162 mg by mouth daily.   buPROPion 300 MG 24 hr tablet Commonly known as:  WELLBUTRIN XL TAKE 1 TABLET (300 MG TOTAL) BY MOUTH DAILY.   clonazePAM 1 MG tablet Commonly known as:  KLONOPIN TAKE 1 TABLET BY MOUTH TWICE A DAY AS NEEDED FOR ANXIETY   co-enzyme Q-10 30 MG capsule Take 100 mg by mouth daily.   diclofenac 75 MG EC tablet Commonly known as:  VOLTAREN TAKE 1 TABLET (75 MG TOTAL) BY MOUTH 2 (TWO) TIMES DAILY.   docusate sodium 100 MG capsule Commonly known as:  COLACE Take 100 mg by mouth at bedtime as needed for mild constipation.   Fish  Oil 1000 MG Caps Take by mouth 2 (two) times daily.   IRON-150 PO Take 150 mg by mouth daily.   Lactulose 20 GM/30ML Soln Take 30 mLs (20 g total) by mouth 3 (three) times daily.   Magnesium 250 MG Tabs Take 500 mg by mouth at bedtime.   Melatonin 1 MG Caps Take 3 mg by mouth at bedtime.   multivitamin tablet Take 1 tablet by mouth daily. For men 50+   OVER THE COUNTER MEDICATION Take 750 mg by mouth every morning. " GABA "   PRISTIQ 50 MG 24 hr tablet Generic drug:  desvenlafaxine TAKE 1 TABLET BY MOUTH EVERY DAY   sildenafil 100 MG tablet Commonly known as:  VIAGRA Take 1 tablet (100 mg total) by mouth daily as needed.   tamsulosin 0.4 MG Caps capsule Commonly known as:  FLOMAX Take 0.4 mg by mouth daily.   VENTOLIN HFA 108 (90 Base) MCG/ACT inhaler Generic drug:  albuterol INHALE 2 PUFFS INTO THE LUNGS EVERY 4 (FOUR) HOURS AS NEEDED FOR WHEEZING OR SHORTNESS OF BREATH.   XARELTO 20 MG Tabs tablet Generic drug:  rivaroxaban TAKE 1 TABLET (20 MG TOTAL) BY MOUTH DAILY WITH SUPPER.  Allergies:  Allergies  Allergen Reactions  . Allopurinol   . Dilaudid [Hydromorphone Hcl] Nausea And Vomiting  . Penicillins   . Sulfonamide Derivatives     Past Medical History, Surgical history, Social history, and Family History were reviewed and updated.  Review of Systems: All other 10 point review of systems is negative.   Physical Exam:  weight is 239 lb 6.4 oz (108.6 kg). His oral temperature is 98.2 F (36.8 C). His blood pressure is 128/77 and his pulse is 80. His respiration is 18.   Wt Readings from Last 3 Encounters:  06/17/16 239 lb 6.4 oz (108.6 kg)  05/19/16 235 lb 1.9 oz (106.6 kg)  04/28/16 232 lb (105.2 kg)    Somewhat obese white male in no obvious distress. Head exam shows no ocular or oral lesions. He has no adenopathy in the neck. Lungs are clear. Cardiac exam regular rate and rhythm with no murmurs rubs or bruits. Abdomen is soft. He has good  bowel sounds. There is no fluid wave. There is no palpable liver or spleen tip. Back exam shows no tenderness over the spine, ribs or hips. Extremities shows no clubbing, cyanosis or edema. He has good range of motion of his joints. He has no venous cord in his legs. He has no Homans sign. Skin exam does show what appears to be some actinic keratoses in his upper back. He has 2 lesions. They are flat. They're somewhat erythematous. They are just to the left of the midline.   Lab Results  Component Value Date   WBC 7.0 06/17/2016   HGB 13.8 06/17/2016   HCT 40.6 06/17/2016   MCV 94 06/17/2016   PLT 219 06/17/2016   No results found for: FERRITIN, IRON, TIBC, UIBC, IRONPCTSAT Lab Results  Component Value Date   RBC 4.34 06/17/2016   No results found for: KPAFRELGTCHN, LAMBDASER, KAPLAMBRATIO No results found for: IGGSERUM, IGA, IGMSERUM No results found for: Kathrynn Ducking, MSPIKE, SPEI   Chemistry      Component Value Date/Time   NA 140 06/17/2016 1500   NA 141 05/19/2016 1524   K 4.5 06/17/2016 1500   K 4.3 05/19/2016 1524   CL 107 (H) 06/17/2016 1500   CL 108 05/19/2016 1524   CO2 25 06/17/2016 1500   CO2 24 05/19/2016 1524   BUN 28 (H) 06/17/2016 1500   BUN 20 05/19/2016 1524   CREATININE 1.98 (H) 06/17/2016 1500   CREATININE 2.1 (H) 05/19/2016 1524      Component Value Date/Time   CALCIUM 9.6 06/17/2016 1500   CALCIUM 9.4 05/19/2016 1524   ALKPHOS 88 06/17/2016 1500   ALKPHOS 75 05/19/2016 1524   AST 21 06/17/2016 1500   AST 25 05/19/2016 1524   ALT 25 06/17/2016 1500   ALT 25 05/19/2016 1524   BILITOT 0.3 06/17/2016 1500   BILITOT 0.60 05/19/2016 1524     Impression and Plan: Joseph Tran is a 66 yo gentleman with chronic nonocclusive thrombus of the femoral andOcclusive thrombus of the popliteal vein and 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   I'm not sure how much can be done to really alleviate  this. I will see about making referral to vascular surgery. Maybe they can offer her some advice.. I am not sure if there is any type of surgical option that he would have.  I will plan to get him back to see [place] in another 2 months.  He just  may have to rely on compression stockings and leg elevation.  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    Volanda Napoleon, MD 2/6/201810:13 AM

## 2016-08-12 ENCOUNTER — Telehealth: Payer: Self-pay | Admitting: Family Medicine

## 2016-08-12 NOTE — Telephone Encounter (Signed)
° ° ° °  Pt request refill of the following:  diclofenac (VOLTAREN) 75 MG EC tablet   Phamacy:   [facility]

## 2016-08-13 MED ORDER — DICLOFENAC SODIUM 75 MG PO TBEC: DELAYED_RELEASE_TABLET | ORAL | 3 refills | Status: DC

## 2016-08-13 NOTE — Telephone Encounter (Signed)
I sent script e-scribe to [facility].

## 2016-08-16 ENCOUNTER — Telehealth: Payer: Self-pay | Admitting: Family Medicine

## 2016-08-16 NOTE — Telephone Encounter (Signed)
Pt needs new rx desvenlafaxine 50 mg #90 w/refills [facility]

## 2016-08-17 MED ORDER — DESVENLAFAXINE SUCCINATE ER 50 MG PO TB24: 50.0000 mg | ORAL_TABLET | Freq: Every day | ORAL | 3 refills | Status: DC

## 2016-08-17 NOTE — Telephone Encounter (Signed)
I sent in the rx to [facility]

## 2016-08-18 ENCOUNTER — Other Ambulatory Visit: Payer: Self-pay | Admitting: Family Medicine

## 2016-08-19 DIAGNOSIS — M25461 Effusion, right knee: Secondary | ICD-10-CM | POA: Diagnosis not present

## 2016-08-19 DIAGNOSIS — M1711 Unilateral primary osteoarthritis, right knee: Secondary | ICD-10-CM | POA: Diagnosis not present

## 2016-08-19 DIAGNOSIS — S83206A Unspecified tear of unspecified meniscus, current injury, right knee, initial encounter: Secondary | ICD-10-CM | POA: Diagnosis not present

## 2016-08-26 DIAGNOSIS — M25561 Pain in right knee: Secondary | ICD-10-CM | POA: Diagnosis not present

## 2016-08-31 ENCOUNTER — Other Ambulatory Visit: Payer: Self-pay | Admitting: Family Medicine

## 2016-08-31 NOTE — Telephone Encounter (Signed)
Pt states his insurance company is requesting he get refills through  [place] [place]  Can you resend the: buPROPion (WELLBUTRIN XL) 300 MG 24 hr tablet  [facility].  Will not be using [facility] anymore.

## 2016-09-01 MED ORDER — BUPROPION HCL ER (XL) 300 MG PO TB24: ORAL_TABLET | ORAL | 3 refills | Status: DC

## 2016-09-01 NOTE — Telephone Encounter (Signed)
Rx has been re-sent to [facility].

## 2016-09-03 ENCOUNTER — Encounter: Payer: Self-pay | Admitting: Vascular Surgery

## 2016-09-14 ENCOUNTER — Ambulatory Visit (INDEPENDENT_AMBULATORY_CARE_PROVIDER_SITE_OTHER): Payer: Medicare Other | Admitting: Vascular Surgery

## 2016-09-14 ENCOUNTER — Encounter: Payer: Self-pay | Admitting: Vascular Surgery

## 2016-09-14 VITALS — BP 134/84 | HR 76 | Temp 97.9°F | Resp 20 | Ht 72.0 in | Wt 241.2 lb

## 2016-09-14 DIAGNOSIS — M7989 Other specified soft tissue disorders: Secondary | ICD-10-CM | POA: Diagnosis not present

## 2016-09-14 DIAGNOSIS — I825Y1 Chronic embolism and thrombosis of unspecified deep veins of right proximal lower extremity: Secondary | ICD-10-CM | POA: Diagnosis not present

## 2016-09-14 NOTE — Progress Notes (Signed)
[facility] [place]  Patient name: Joseph Tran MRN: [id number] DOB: 07/11/1950 Sex: male  REASON FOR CONSULT: Evaluation of right leg chronic swelling with history of DVT  HPI: Joseph Tran is a 66 y.o. male, who is here today for discussion of his chronic swelling in right leg DVT. He initially was diagnosed with a DVT in his right leg in April 2016. He did not recall any trauma to his right leg. He was on testosterone supplementation at the time of his DVT. He has been treated the with Xarelto anticoagulation since that time. He has had multiple duplexes since his initial diagnosis. I reviewed the results of these and August and December 2016 and in April July and November 2017. These have shown DVT initially in the right common femoral vein distally. He has had resolution and there is had been some variation of interpretation over each of these studies. Most recently this showed that he in all likelihood does have a occlusion of the popliteal vein. There is been no suggestion of these have any acute on chronic DVT. He does have chronic swelling. There is also a heavy pressure sensation in his thigh anteriorly and also in his calf. He does have primary right knee pathology and has a different pain associated with his right knee as well.  Past Medical History:  Diagnosis Date  . Acoustic neuroma (HCC)    benign  . Anemia   . Asthma   . Colon polyps   . COPD (chronic obstructive pulmonary disease) ([place])   . Depression   . DVT (deep venous thrombosis) ([place])   . Hypogonadism male    sees Dr. Karsten Ro   . Plantar fasciitis    rt foot  . Sleep apnea    hx    Family History  Problem Relation Age of Onset  . Heart disease      parents  . Hyperlipidemia    . Stroke      grandparents  . Sudden death      uncle less than 73 yrs old  . Heart disease Father     SOCIAL HISTORY: Social History   Social History  . Marital  status: Single    Spouse name: N/A  . Number of children: N/A  . Years of education: N/A   Occupational History  . Not on file.   Social History Main Topics  . Smoking status: Never Smoker  . Smokeless tobacco: Never Used     Comment: NEVER USED TOBACCO  . Alcohol use No  . Drug use: No  . Sexual activity: Not on file   Other Topics Concern  . Not on file   Social History Narrative  . No narrative on file    Allergies  Allergen Reactions  . Allopurinol   . Dilaudid [Hydromorphone Hcl] Nausea And Vomiting  . Penicillins   . Sulfonamide Derivatives     Current Outpatient Prescriptions  Medication Sig Dispense Refill  . aspirin EC 81 MG tablet Take 162 mg by mouth daily.    [name] buPROPion (WELLBUTRIN XL) 300 MG 24 hr tablet TAKE 1 TABLET (300 MG TOTAL) BY MOUTH DAILY. 90 tablet 3  . clonazePAM (KLONOPIN) 1 MG tablet TAKE 1 TABLET BY MOUTH TWICE A DAY AS NEEDED FOR ANXIETY (Patient taking differently: Daily at night.) 180 tablet 1  . co-enzyme Q-10 30 MG capsule Take 100 mg by mouth daily.    [name] desvenlafaxine (PRISTIQ) 50 MG 24 hr  tablet Take 1 tablet (50 mg total) by mouth daily. 90 tablet 3  . Fe Bisgly-Succ-C-Thre-B12-FA (IRON-150 PO) Take 150 mg by mouth daily.    . finasteride (PROSCAR) 5 MG tablet Take 5 mg by mouth daily.    . Magnesium 250 MG TABS Take 500 mg by mouth at bedtime.    . Melatonin 1 MG CAPS Take 3 mg by mouth at bedtime.     . Multiple Vitamin (MULTIVITAMIN) tablet Take 1 tablet by mouth daily. For men 50+    . Omega-3 Fatty Acids (FISH OIL) 1000 MG CAPS Take by mouth 2 (two) times daily.    [name] OVER THE COUNTER MEDICATION Take 750 mg by mouth every morning. " GABA "    . senna (EQ VEGETABLE LAXATIVE) 8.6 MG tablet Take 1 tablet by mouth daily.    . sildenafil (VIAGRA) 100 MG tablet Take 1 tablet (100 mg total) by mouth daily as needed. 10 tablet 11  . tamsulosin (FLOMAX) 0.4 MG CAPS Take 0.4 mg by mouth daily.    . VENTOLIN HFA 108 (90 Base) MCG/ACT  inhaler INHALE 2 PUFFS INTO THE LUNGS EVERY 4 (FOUR) HOURS AS NEEDED FOR WHEEZING OR SHORTNESS OF BREATH. 54 Inhaler 0  . XARELTO 20 MG TABS tablet TAKE 1 TABLET (20 MG TOTAL) BY MOUTH DAILY WITH SUPPER. (Patient taking differently: TAKE 1/2  TABLET (20 MG TOTAL) BY MOUTH DAILY WITH SUPPER.) 90 tablet 3  . diclofenac (VOLTAREN) 75 MG EC tablet TAKE 1 TABLET (75 MG TOTAL) BY MOUTH 2 (TWO) TIMES DAILY. (Patient not taking: Reported on 09/14/2016) 180 tablet 3  . docusate sodium (COLACE) 100 MG capsule Take 100 mg by mouth at bedtime as needed for mild constipation.     No current facility-administered medications for this visit.     REVIEW OF SYSTEMS:  [X]  denotes positive finding, [ ]  denotes negative finding Cardiac  Comments:  Chest pain or chest pressure:    Shortness of breath upon exertion: x   Short of breath when lying flat:    Irregular heart rhythm:        Vascular    Pain in calf, thigh, or hip brought on by ambulation: x   Pain in feet at night that wakes you up from your sleep:     Blood clot in your veins: x   Leg swelling:  x       Pulmonary    Oxygen at home:    Productive cough:     Wheezing:         Neurologic    Sudden weakness in arms or legs:     Sudden numbness in arms or legs:     Sudden onset of difficulty speaking or slurred speech:    Temporary loss of vision in one eye:     Problems with dizziness:         Gastrointestinal    Blood in stool:     Vomited blood:         Genitourinary    Burning when urinating:     Blood in urine:        Psychiatric    Major depression:  x       Hematologic    Bleeding problems:    Problems with blood clotting too easily:        Skin    Rashes or ulcers:        Constitutional    Fever or chills:  PHYSICAL EXAM: Vitals:   09/14/16 1400  BP: 134/84  Pulse: 76  Resp: 20  Temp: 97.9 F (36.6 C)  TempSrc: Oral  SpO2: 94%  Weight: 241 lb 3.2 oz (109.4 kg)  Height: 6' (1.829 m)    GENERAL: The  patient is a well-nourished male, in no acute distress. The vital signs are documented above. CARDIOVASCULAR: 2+ radial and 2+ dorsalis pedis pulses bilaterally. Carotid arteries without bruits bilaterally PULMONARY: There is good air exchange  ABDOMEN: Soft and non-tender  MUSCULOSKELETAL: There are no major deformities or cyanosis. NEUROLOGIC: No focal weakness or paresthesias are detected. SKIN: There are no ulcers or rashes noted. Does have some telangiectasia around his ankle but no evidence of hemosiderin deposit or varicosities PSYCHIATRIC: The patient has a normal affect.  DATA:  Prior duplexes of shown chronic DVT.  I reviewed his hematology evaluation and do not see any evidence that he has had a diagnosis of hypercoagulability.  MEDICAL ISSUES: Right leg DVT nearly 2 years ago. Has been on Xarelto since that time. I do not see any evidence of hypercoagulable state. Discussed this with the patient and would feel appropriate to discontinue his anticoagulation. Should he have recurrent thromboses would resume this. He does have contemplation of knee arthroscopy. Would need to have anticoagulation coverage during this time with his known right leg DVT playing him at increased risk for acute DVT if he was not anticoagulated. He was reassured with this discussion will see [place] again on an as-needed basis   Rosetta Posner, MD [facility] Tel [phone] Pager [phone]

## 2016-09-20 ENCOUNTER — Other Ambulatory Visit (HOSPITAL_BASED_OUTPATIENT_CLINIC_OR_DEPARTMENT_OTHER): Payer: Medicare Other

## 2016-09-20 ENCOUNTER — Ambulatory Visit (HOSPITAL_BASED_OUTPATIENT_CLINIC_OR_DEPARTMENT_OTHER): Payer: Medicare Other | Admitting: Hematology & Oncology

## 2016-09-20 VITALS — BP 121/75 | HR 82 | Temp 97.9°F | Resp 16 | Wt 242.0 lb

## 2016-09-20 DIAGNOSIS — I82511 Chronic embolism and thrombosis of right femoral vein: Secondary | ICD-10-CM | POA: Diagnosis not present

## 2016-09-20 DIAGNOSIS — I825Y1 Chronic embolism and thrombosis of unspecified deep veins of right proximal lower extremity: Secondary | ICD-10-CM | POA: Diagnosis not present

## 2016-09-20 DIAGNOSIS — I82811 Embolism and thrombosis of superficial veins of right lower extremities: Secondary | ICD-10-CM

## 2016-09-20 DIAGNOSIS — Z7901 Long term (current) use of anticoagulants: Secondary | ICD-10-CM | POA: Diagnosis not present

## 2016-09-20 DIAGNOSIS — D508 Other iron deficiency anemias: Secondary | ICD-10-CM

## 2016-09-20 DIAGNOSIS — I82403 Acute embolism and thrombosis of unspecified deep veins of lower extremity, bilateral: Secondary | ICD-10-CM

## 2016-09-20 LAB — CBC WITH DIFFERENTIAL (CANCER CENTER ONLY)
BASO#: 0.1 10*3/uL (ref 0.0–0.2)
BASO%: 0.8 % (ref 0.0–2.0)
EOS ABS: 0.2 10*3/uL (ref 0.0–0.5)
EOS%: 3.3 % (ref 0.0–7.0)
HEMATOCRIT: 40.8 % (ref 38.7–49.9)
HEMOGLOBIN: 13.9 g/dL (ref 13.0–17.1)
LYMPH#: 1.1 10*3/uL (ref 0.9–3.3)
LYMPH%: 17.2 % (ref 14.0–48.0)
MCH: 31.8 pg (ref 28.0–33.4)
MCHC: 34.1 g/dL (ref 32.0–35.9)
MCV: 93 fL (ref 82–98)
MONO#: 0.3 10*3/uL (ref 0.1–0.9)
MONO%: 4.8 % (ref 0.0–13.0)
NEUT%: 73.9 % (ref 40.0–80.0)
NEUTROS ABS: 4.7 10*3/uL (ref 1.5–6.5)
PLATELETS: 233 10*3/uL (ref 145–400)
RBC: 4.37 10*6/uL (ref 4.20–5.70)
RDW: 12.6 % (ref 11.1–15.7)
WBC: 6.4 10*3/uL (ref 4.0–10.0)

## 2016-09-20 LAB — COMPREHENSIVE METABOLIC PANEL (CC13)
A/G RATIO: 1.6 (ref 1.2–2.2)
ALBUMIN: 4.2 g/dL (ref 3.6–4.8)
ALT: 26 IU/L (ref 0–44)
AST (SGOT): 24 IU/L (ref 0–40)
Alkaline Phosphatase, S: 84 IU/L (ref 39–117)
BILIRUBIN TOTAL: 0.4 mg/dL (ref 0.0–1.2)
BUN / CREAT RATIO: 20 (ref 10–24)
BUN: 43 mg/dL — ABNORMAL HIGH (ref 8–27)
CALCIUM: 9.3 mg/dL (ref 8.6–10.2)
Carbon Dioxide, Total: 19 mmol/L (ref 18–29)
Chloride, Ser: 105 mmol/L (ref 96–106)
Creatinine, Ser: 2.19 mg/dL — ABNORMAL HIGH (ref 0.76–1.27)
GFR, EST AFRICAN AMERICAN: 35 mL/min/{1.73_m2} — AB (ref >59)
GFR, EST NON AFRICAN AMERICAN: 30 mL/min/{1.73_m2} — AB (ref >59)
GLOBULIN, TOTAL: 2.7 g/dL (ref 1.5–4.5)
Glucose: 150 mg/dL — ABNORMAL HIGH (ref 65–99)
Potassium, Ser: 4.3 mmol/L (ref 3.5–5.2)
Sodium: 136 mmol/L (ref 134–144)
TOTAL PROTEIN: 6.9 g/dL (ref 6.0–8.5)

## 2016-09-20 NOTE — Progress Notes (Signed)
Hematology and Oncology Follow Up Visit  Joseph Tran [id number] July 05, 1951 66 y.o. 09/20/2016   Principle Diagnosis:  Thromboembolic disease of the right leg  Current Therapy:   Xarelto  10 mg daily along with 2 baby aspirin daily today (10/29/15)    Interim History:  [name] is is here today for a follow-up. We last saw him in December.  He has seen Dr. Donnetta Hutching of vascular surgery. Dr. 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  [name] now is having problems with the right knee. He has a meniscus tear. He has seen Dr. Dorna Leitz of [facility]. It sounds like he will need some type of arthroscopic surgery for the meniscus tear. He is wearing a brace on the right knee.  He currently is on low-dose Xarelto was 2 baby aspirin. Adding this is a good regimen for him as his right leg is not as mobile as the left.   He wants know when he go back to riding horses. He goes to horse shows. The busy summer season is coming up. He may have his knee surgery before they summer or after the summer. I told him that as long as he is on the anticoagulation combination, that horse riding should be okay.   he's gained weight. He is not pale to exercise because of the right knee.  He did have the skin lesions removed. None were melanoma. All appear to be basal cell carcinoma.  Overall, I say his performance status is probably ECOG 1.  Medications:  Allergies as of 09/20/2016      Reactions   Penicillin G Hives   Sulfamethoxazole Hives   Allopurinol Other (See Comments)   Penicillins    Sulfonamide Derivatives    Dilaudid [hydromorphone Hcl] Nausea And Vomiting      Medication List       Accurate as of 09/20/16  4:26 PM. Always use your most recent med list.          aspirin EC 81 MG tablet Take 162 mg by mouth daily.   buPROPion 300 MG 24 hr tablet Commonly known as:  WELLBUTRIN XL TAKE 1  TABLET (300 MG TOTAL) BY MOUTH DAILY.   clonazePAM 1 MG tablet Commonly known as:  KLONOPIN TAKE 1 TABLET BY MOUTH TWICE A DAY AS NEEDED FOR ANXIETY   co-enzyme Q-10 30 MG capsule Take 100 mg by mouth daily.   desvenlafaxine 50 MG 24 hr tablet Commonly known as:  PRISTIQ Take 1 tablet (50 mg total) by mouth daily.   diclofenac 75 MG EC tablet Commonly known as:  VOLTAREN TAKE 1 TABLET (75 MG TOTAL) BY MOUTH 2 (TWO) TIMES DAILY.   docusate sodium 100 MG capsule Commonly known as:  COLACE Take 100 mg by mouth at bedtime as needed for mild constipation.   EQ VEGETABLE LAXATIVE 8.6 MG tablet Generic drug:  senna Take 1 tablet by mouth daily.   finasteride 5 MG tablet Commonly known as:  PROSCAR Take 5 mg by mouth daily.   Fish Oil 1000 MG Caps Take by mouth 2 (two) times daily.   IRON-150 PO Take 150 mg by mouth daily.   Magnesium 250 MG Tabs Take 500 mg by mouth at bedtime.   Melatonin 1 MG Caps Take 3 mg by mouth at bedtime.   multivitamin tablet Take 1 tablet by mouth daily. For men 50+   OVER THE  COUNTER MEDICATION Take 750 mg by mouth every morning. " GABA "   sildenafil 100 MG tablet Commonly known as:  VIAGRA Take 1 tablet (100 mg total) by mouth daily as needed.   tamsulosin 0.4 MG Caps capsule Commonly known as:  FLOMAX Take 0.4 mg by mouth daily.   VENTOLIN HFA 108 (90 Base) MCG/ACT inhaler Generic drug:  albuterol INHALE 2 PUFFS INTO THE LUNGS EVERY 4 (FOUR) HOURS AS NEEDED FOR WHEEZING OR SHORTNESS OF BREATH.   XARELTO 20 MG Tabs tablet Generic drug:  rivaroxaban TAKE 1 TABLET (20 MG TOTAL) BY MOUTH DAILY WITH SUPPER.       Allergies:  Allergies  Allergen Reactions  . Penicillin G Hives  . Sulfamethoxazole Hives  . Allopurinol Other (See Comments)  . Penicillins   . Sulfonamide Derivatives   . Dilaudid [Hydromorphone Hcl] Nausea And Vomiting    Past Medical History, Surgical history, Social history, and Family History were reviewed  and updated.  Review of Systems: All other 10 point review of systems is negative.   Physical Exam:  weight is 242 lb (109.8 kg). His oral temperature is 97.9 F (36.6 C). His blood pressure is 121/75 and his pulse is 82. His respiration is 16 and oxygen saturation is 93%.   Wt Readings from Last 3 Encounters:  09/20/16 242 lb (109.8 kg)  09/14/16 241 lb 3.2 oz (109.4 kg)  06/17/16 239 lb 6.4 oz (108.6 kg)    Somewhat obese white male in no obvious distress. Head exam shows no ocular or oral lesions. He has no adenopathy in the neck. Lungs are clear. Cardiac exam regular rate and rhythm with no murmurs rubs or bruits. Abdomen is soft. He has good bowel sounds. There is no fluid wave. There is no palpable liver or spleen tip. Back exam shows no tenderness over the spine, ribs or hips. Extremities shows no clubbing, cyanosis or edema. He has good range of motion of his joints. He has no venous cord in his legs. He has no Homans sign. Skin exam shows no suspicious skin lesions. He has no petechia or ecchymoses. Neurological exam shows no focal neurological deficits.   Lab Results  Component Value Date   WBC 6.4 09/20/2016   HGB 13.9 09/20/2016   HCT 40.8 09/20/2016   MCV 93 09/20/2016   PLT 233 09/20/2016   No results found for: FERRITIN, IRON, TIBC, UIBC, IRONPCTSAT Lab Results  Component Value Date   RBC 4.37 09/20/2016   No results found for: KPAFRELGTCHN, LAMBDASER, KAPLAMBRATIO No results found for: IGGSERUM, IGA, IGMSERUM No results found for: Kathrynn Ducking, MSPIKE, SPEI   Chemistry      Component Value Date/Time   NA 140 06/17/2016 1500   NA 141 05/19/2016 1524   K 4.5 06/17/2016 1500   K 4.3 05/19/2016 1524   CL 107 (H) 06/17/2016 1500   CL 108 05/19/2016 1524   CO2 25 06/17/2016 1500   CO2 24 05/19/2016 1524   BUN 28 (H) 06/17/2016 1500   BUN 20 05/19/2016 1524   CREATININE 1.98 (H) 06/17/2016 1500   CREATININE 2.1 (H)  05/19/2016 1524      Component Value Date/Time   CALCIUM 9.6 06/17/2016 1500   CALCIUM 9.4 05/19/2016 1524   ALKPHOS 88 06/17/2016 1500   ALKPHOS 75 05/19/2016 1524   AST 21 06/17/2016 1500   AST 25 05/19/2016 1524   ALT 25 06/17/2016 1500   ALT 25 05/19/2016 1524  BILITOT 0.3 06/17/2016 1500   BILITOT 0.60 05/19/2016 1524     Impression and Plan: Mr. 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   From my perspective, I think he has to stay on the anticoagulation. I just think that given the issues with the right knee, and the fact that he wants to ride horses, I think it would be safer for him to have the anticoagulation "on board". He has had no problems with anticoagulation. His blood counts are holding steady so he is not bleeding.   I told him told him living no when he is going to have knee surgery. I can coordinate with Dr. Berenice Primas as to when we need to adjust his anticoagulation.   Otherwise, I will have him come back in 6 months. I think this would be very reasonable. He can always come back sooner if there are any problems.   I spent about 25 minutes with him today.     Volanda Napoleon, MD 3/19/20184:26 PM

## 2016-09-21 LAB — D-DIMER, QUANTITATIVE: D-DIMER: 0.43 mg/L FEU (ref 0.00–0.49)

## 2016-09-22 ENCOUNTER — Ambulatory Visit: Payer: Medicare Other | Admitting: Hematology & Oncology

## 2016-09-22 ENCOUNTER — Other Ambulatory Visit: Payer: Medicare Other

## 2016-09-22 LAB — LUPUS ANTICOAGULANT PANEL
DRVVT MIX: 45 s (ref 0.0–47.0)
PTT-LA: 43.2 s (ref 0.0–51.9)
dRVVT: 58.4 s — ABNORMAL HIGH (ref 0.0–47.0)

## 2016-09-28 DIAGNOSIS — M25561 Pain in right knee: Secondary | ICD-10-CM | POA: Diagnosis not present

## 2016-10-18 ENCOUNTER — Telehealth: Payer: Self-pay | Admitting: Family Medicine

## 2016-10-18 MED ORDER — CLONAZEPAM 1 MG PO TABS: ORAL_TABLET | ORAL | 1 refills | Status: DC

## 2016-10-18 NOTE — Telephone Encounter (Signed)
Ready to fax  

## 2016-10-18 NOTE — Telephone Encounter (Signed)
Pt request refill  clonazePAM (KLONOPIN) 1 MG tablet  [facility] [place], [place]

## 2016-10-19 NOTE — Telephone Encounter (Signed)
Script was printed and faxed to [facility]. 

## 2017-02-01 DIAGNOSIS — D1801 Hemangioma of skin and subcutaneous tissue: Secondary | ICD-10-CM | POA: Diagnosis not present

## 2017-02-01 DIAGNOSIS — L570 Actinic keratosis: Secondary | ICD-10-CM | POA: Diagnosis not present

## 2017-02-01 DIAGNOSIS — L821 Other seborrheic keratosis: Secondary | ICD-10-CM | POA: Diagnosis not present

## 2017-02-01 DIAGNOSIS — L814 Other melanin hyperpigmentation: Secondary | ICD-10-CM | POA: Diagnosis not present

## 2017-02-01 DIAGNOSIS — Z85828 Personal history of other malignant neoplasm of skin: Secondary | ICD-10-CM | POA: Diagnosis not present

## 2017-02-07 ENCOUNTER — Encounter: Payer: Self-pay | Admitting: Family Medicine

## 2017-02-07 ENCOUNTER — Ambulatory Visit (INDEPENDENT_AMBULATORY_CARE_PROVIDER_SITE_OTHER): Payer: Medicare Other | Admitting: Family Medicine

## 2017-02-07 VITALS — BP 113/79 | HR 81 | Temp 98.7°F | Ht 72.0 in | Wt 235.0 lb

## 2017-02-07 DIAGNOSIS — R1011 Right upper quadrant pain: Secondary | ICD-10-CM

## 2017-02-07 MED ORDER — RANITIDINE HCL 150 MG PO TABS: 300.0000 mg | ORAL_TABLET | Freq: Two times a day (BID) | ORAL | 0 refills | Status: DC

## 2017-02-07 NOTE — Progress Notes (Signed)
   Subjective:    Patient ID: Joseph Tran, male    DOB: [birth year], 66 y.o.   MRN: [id number]  HPI Here for 2 months of frequent RUQ and right flank pains. These often wake him up in the middle of the night. They are not severe but are uncomfortable. Eating food often seems to help the pains. No fever or nausea. He tends to be chronically constipated but he controls this with daily doses of OTC stool softeners and Metamucil. No urinary complaints. Of note he has a hx of duodenal ulcers.    Review of Systems  Constitutional: Negative.   Respiratory: Negative.   Cardiovascular: Negative.   Gastrointestinal: Positive for abdominal pain and constipation. Negative for abdominal distention, anal bleeding, blood in stool, diarrhea, nausea, rectal pain and vomiting.  Endocrine: Negative.   Genitourinary: Negative.        Objective:   Physical Exam  Constitutional: He is oriented to person, place, and time. He appears well-developed and well-nourished. No distress.  Neck: No thyromegaly present.  Cardiovascular: Normal rate, regular rhythm, normal heart sounds and intact distal pulses.   Pulmonary/Chest: Effort normal and breath sounds normal. No respiratory distress. He has no wheezes. He has no rales.  Abdominal: Soft. Bowel sounds are normal. He exhibits no distension and no mass. There is no tenderness. There is no rebound and no guarding.  Lymphadenopathy:    He has no cervical adenopathy.  Neurological: He is alert and oriented to person, place, and time.          Assessment & Plan:  RUQ pain, most likely due to duodenitis or possibly an ulcer. He will try taking Zantac 300 mg bid for  2 weeks and then report back to [place].  [name], MD

## 2017-02-07 NOTE — Patient Instructions (Signed)
WE NOW OFFER   Joseph Tran's FAST TRACK!!!  SAME DAY Appointments for ACUTE CARE  Such as: Sprains, Injuries, cuts, abrasions, rashes, muscle pain, joint pain, back pain Colds, flu, sore throats, headache, allergies, cough, fever  Ear pain, sinus and eye infections Abdominal pain, nausea, vomiting, diarrhea, upset stomach Animal/insect bites  3 Easy Ways to Schedule: Walk-In Scheduling Call in scheduling Mychart Sign-up: [email or web address]         

## 2017-02-10 DIAGNOSIS — R972 Elevated prostate specific antigen [PSA]: Secondary | ICD-10-CM | POA: Diagnosis not present

## 2017-02-17 DIAGNOSIS — N529 Male erectile dysfunction, unspecified: Secondary | ICD-10-CM | POA: Diagnosis not present

## 2017-02-17 DIAGNOSIS — R3129 Other microscopic hematuria: Secondary | ICD-10-CM | POA: Diagnosis not present

## 2017-02-17 DIAGNOSIS — R972 Elevated prostate specific antigen [PSA]: Secondary | ICD-10-CM | POA: Diagnosis not present

## 2017-02-17 DIAGNOSIS — N401 Enlarged prostate with lower urinary tract symptoms: Secondary | ICD-10-CM | POA: Diagnosis not present

## 2017-02-17 DIAGNOSIS — Z6831 Body mass index (BMI) 31.0-31.9, adult: Secondary | ICD-10-CM | POA: Diagnosis not present

## 2017-02-17 DIAGNOSIS — N138 Other obstructive and reflux uropathy: Secondary | ICD-10-CM | POA: Diagnosis not present

## 2017-02-17 DIAGNOSIS — R319 Hematuria, unspecified: Secondary | ICD-10-CM | POA: Diagnosis not present

## 2017-02-18 ENCOUNTER — Telehealth: Payer: Self-pay | Admitting: Family Medicine

## 2017-02-18 MED ORDER — RANITIDINE HCL 300 MG PO TABS: 300.0000 mg | ORAL_TABLET | Freq: Two times a day (BID) | ORAL | 3 refills | Status: DC

## 2017-02-18 NOTE — Telephone Encounter (Signed)
Pt calling stating the the Norva Karvonen is working and would like to have the Rx going to [facility].  Regular f/u with Dr. Nevada Crane (urologist on 02/17/17) and found blood in his urine and he is scheduled for a CT scan and cystoscopy 03/02/17

## 2017-02-18 NOTE — Telephone Encounter (Signed)
I sent in a new rx to [facility]

## 2017-02-18 NOTE — Telephone Encounter (Signed)
Script was sent e-scribe, tried to reach pt and no answer.

## 2017-02-24 ENCOUNTER — Other Ambulatory Visit: Payer: Self-pay | Admitting: Family

## 2017-02-24 ENCOUNTER — Telehealth: Payer: Self-pay | Admitting: *Deleted

## 2017-02-24 DIAGNOSIS — M25471 Effusion, right ankle: Secondary | ICD-10-CM

## 2017-02-24 DIAGNOSIS — Z86718 Personal history of other venous thrombosis and embolism: Secondary | ICD-10-CM

## 2017-02-24 NOTE — Telephone Encounter (Signed)
Patient c/o swelling to the right ankle. This is the same leg he has had a DVT in prior. The swelling began three to four days ago. It's not painful, not red, not warm. He states the swelling decreases when he's resting or sleeping, but increases in size again when he's on his feet at work.   Reviewed symptoms with Laverna Peace NP, she has placed an order for a doppler.   Patient aware and number given to patient for him to schedule.

## 2017-02-25 ENCOUNTER — Ambulatory Visit (HOSPITAL_BASED_OUTPATIENT_CLINIC_OR_DEPARTMENT_OTHER)
Admission: RE | Admit: 2017-02-25 | Discharge: 2017-02-25 | Disposition: A | Discharge status: Home / Self Care | Payer: Medicare Other | Source: Ambulatory Visit | Attending: Family | Admitting: Family

## 2017-02-25 DIAGNOSIS — I82431 Acute embolism and thrombosis of right popliteal vein: Secondary | ICD-10-CM | POA: Diagnosis not present

## 2017-02-25 DIAGNOSIS — I82411 Acute embolism and thrombosis of right femoral vein: Secondary | ICD-10-CM | POA: Insufficient documentation

## 2017-02-25 DIAGNOSIS — Z86718 Personal history of other venous thrombosis and embolism: Secondary | ICD-10-CM | POA: Diagnosis not present

## 2017-02-25 DIAGNOSIS — M25471 Effusion, right ankle: Secondary | ICD-10-CM | POA: Diagnosis not present

## 2017-03-02 ENCOUNTER — Telehealth: Payer: Self-pay | Admitting: *Deleted

## 2017-03-02 ENCOUNTER — Other Ambulatory Visit: Payer: Self-pay | Admitting: *Deleted

## 2017-03-02 DIAGNOSIS — N200 Calculus of kidney: Secondary | ICD-10-CM | POA: Diagnosis not present

## 2017-03-02 DIAGNOSIS — R972 Elevated prostate specific antigen [PSA]: Secondary | ICD-10-CM | POA: Diagnosis not present

## 2017-03-02 DIAGNOSIS — R319 Hematuria, unspecified: Secondary | ICD-10-CM | POA: Diagnosis not present

## 2017-03-02 MED ORDER — RIVAROXABAN 20 MG PO TABS: ORAL_TABLET | ORAL | 3 refills | Status: DC

## 2017-03-02 NOTE — Telephone Encounter (Addendum)
Message left on personal voice mail  ----- Message from Eliezer Bottom, NP sent at 03/01/2017  2:54 PM EDT ----- Regarding: [place] It actually looks a little better. Possibly post phlebitic pain. Is he wearing a compression stocking?   ----- Message ----- From: Interface, Rad Results In Sent: 02/26/2017  11:58 AM To: Eliezer Bottom, NP

## 2017-03-09 ENCOUNTER — Telehealth: Payer: Self-pay | Admitting: Family Medicine

## 2017-03-09 NOTE — Telephone Encounter (Signed)
[facility] called to advise Dr Marin Olp has prescribed Xarelto for the pt. According to Dr Marin Olp, pt should not be on the  diclofenac (VOLTAREN) 75 MG EC tablet will taking this med.  [facility] was asked to call our office and have Dr Sarajane Jews prescribe an alternate.

## 2017-03-10 DIAGNOSIS — M25561 Pain in right knee: Secondary | ICD-10-CM | POA: Diagnosis not present

## 2017-03-10 DIAGNOSIS — M67911 Unspecified disorder of synovium and tendon, right shoulder: Secondary | ICD-10-CM | POA: Diagnosis not present

## 2017-03-10 DIAGNOSIS — M25461 Effusion, right knee: Secondary | ICD-10-CM | POA: Diagnosis not present

## 2017-03-10 DIAGNOSIS — S83282A Other tear of lateral meniscus, current injury, left knee, initial encounter: Secondary | ICD-10-CM | POA: Diagnosis not present

## 2017-03-10 NOTE — Telephone Encounter (Signed)
I spoke with pt and he agreed to stop the Diclofenac also it was removed from chart. Pt wants to make sure that you did get report from Dr. Nevada Crane about kidney function, his left kidney is barely functioning at all.

## 2017-03-10 NOTE — Telephone Encounter (Signed)
I agree with this. Stop the Diclofenac. He can take ES Tylenol (500 mg) 2 tablets BID as needed for pain

## 2017-03-14 ENCOUNTER — Telehealth: Payer: Self-pay | Admitting: Family Medicine

## 2017-03-14 NOTE — Telephone Encounter (Signed)
Yes I see Dr. Juel Burrow notes in the chart

## 2017-03-14 NOTE — Telephone Encounter (Signed)
Pt is aware.  

## 2017-03-14 NOTE — Telephone Encounter (Signed)
I spoke with pharmacist, discontinued Diclofenac from current medication list, pt is on on Xarelto, per Dr. Sarajane Jews.

## 2017-03-14 NOTE — Telephone Encounter (Signed)
Pharmacy calling needing clarification on two medications  Reference #[id number]

## 2017-03-22 ENCOUNTER — Ambulatory Visit: Payer: Medicare Other

## 2017-03-25 ENCOUNTER — Ambulatory Visit (INDEPENDENT_AMBULATORY_CARE_PROVIDER_SITE_OTHER): Payer: Medicare Other | Admitting: *Deleted

## 2017-03-25 DIAGNOSIS — Z23 Encounter for immunization: Secondary | ICD-10-CM

## 2017-03-31 ENCOUNTER — Other Ambulatory Visit (HOSPITAL_BASED_OUTPATIENT_CLINIC_OR_DEPARTMENT_OTHER): Payer: Medicare Other

## 2017-03-31 ENCOUNTER — Ambulatory Visit: Payer: Medicare Other | Admitting: Hematology & Oncology

## 2017-03-31 ENCOUNTER — Ambulatory Visit (HOSPITAL_BASED_OUTPATIENT_CLINIC_OR_DEPARTMENT_OTHER): Payer: Medicare Other | Admitting: Hematology & Oncology

## 2017-03-31 ENCOUNTER — Other Ambulatory Visit: Payer: Medicare Other

## 2017-03-31 VITALS — BP 132/67 | HR 82 | Temp 98.6°F | Resp 18 | Wt 237.0 lb

## 2017-03-31 DIAGNOSIS — C180 Malignant neoplasm of cecum: Secondary | ICD-10-CM

## 2017-03-31 DIAGNOSIS — D508 Other iron deficiency anemias: Secondary | ICD-10-CM

## 2017-03-31 DIAGNOSIS — I82403 Acute embolism and thrombosis of unspecified deep veins of lower extremity, bilateral: Secondary | ICD-10-CM | POA: Diagnosis not present

## 2017-03-31 LAB — CBC WITH DIFFERENTIAL (CANCER CENTER ONLY)
BASO#: 0.1 10*3/uL (ref 0.0–0.2)
BASO%: 0.9 % (ref 0.0–2.0)
EOS%: 5 % (ref 0.0–7.0)
Eosinophils Absolute: 0.3 10*3/uL (ref 0.0–0.5)
HEMATOCRIT: 39.6 % (ref 38.7–49.9)
HEMOGLOBIN: 13.6 g/dL (ref 13.0–17.1)
LYMPH#: 1.2 10*3/uL (ref 0.9–3.3)
LYMPH%: 22 % (ref 14.0–48.0)
MCH: 32.2 pg (ref 28.0–33.4)
MCHC: 34.3 g/dL (ref 32.0–35.9)
MCV: 94 fL (ref 82–98)
MONO#: 0.3 10*3/uL (ref 0.1–0.9)
MONO%: 5.7 % (ref 0.0–13.0)
NEUT%: 66.4 % (ref 40.0–80.0)
NEUTROS ABS: 3.6 10*3/uL (ref 1.5–6.5)
Platelets: 195 10*3/uL (ref 145–400)
RBC: 4.23 10*6/uL (ref 4.20–5.70)
RDW: 12.7 % (ref 11.1–15.7)
WBC: 5.4 10*3/uL (ref 4.0–10.0)

## 2017-03-31 LAB — CMP (CANCER CENTER ONLY)
ALK PHOS: 90 U/L — AB (ref 26–84)
ALT: 30 U/L (ref 10–47)
AST: 22 U/L (ref 11–38)
Albumin: 3.4 g/dL (ref 3.3–5.5)
BUN: 24 mg/dL — AB (ref 7–22)
CALCIUM: 9.5 mg/dL (ref 8.0–10.3)
CO2: 23 mEq/L (ref 18–33)
Chloride: 110 mEq/L — ABNORMAL HIGH (ref 98–108)
Creat: 2.1 mg/dl — ABNORMAL HIGH (ref 0.6–1.2)
GLUCOSE: 122 mg/dL — AB (ref 73–118)
Potassium: 3.8 mEq/L (ref 3.3–4.7)
Sodium: 142 mEq/L (ref 128–145)
Total Bilirubin: 0.6 mg/dl (ref 0.20–1.60)
Total Protein: 6.7 g/dL (ref 6.4–8.1)

## 2017-03-31 NOTE — Progress Notes (Signed)
Hematology and Oncology Follow Up Visit  Joseph Tran [id number] [birth year] 66 y.o. 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 He is not doing well. He is in a lot of pain. He finally will have his right knee operated on on October 12. He has had fluid taken off his knee on a couple occasions.  The main problem that he is having is that he has atrophied left kidney. He has chronic renal insufficiency. He had a CT scan done recently which showed an atrophied left kidney. He sees a nephrologist tomorrow. He is very worried about his renal function.  Given that he is on Xarelto, we will have to watch his renal function closely. Again, I have him on low-dose Xarelto.  He did have a Doppler done back in June of his right leg. This showed a persistent thrombus in the popliteal vein extending up to the distal femoral vein. This is unchanged.  He has had no bleeding. He has had some bouts of hematuria. This is from this renal issue.  He has had no fever. He has had no rashes.  Overall, his performance status is ECOG 1.   Medications:  Allergies as of 03/31/2017      Reactions   Penicillin G Hives   Sulfamethoxazole Hives   Allopurinol Other (See Comments)   Penicillins    Sulfonamide Derivatives    Dilaudid [hydromorphone Hcl] Nausea And Vomiting      Medication List       Accurate as of 03/31/17  4:44 PM. Always use your most recent med list.          aspirin EC 81 MG tablet Take 162 mg by mouth daily.   buPROPion 300 MG 24 hr tablet Commonly known as:  WELLBUTRIN XL TAKE 1 TABLET (300 MG TOTAL) BY MOUTH DAILY.   clonazePAM 1 MG tablet Commonly known as:  KLONOPIN TAKE 1 TABLET BY MOUTH TWICE A DAY AS NEEDED FOR ANXIETY   desvenlafaxine 50  MG 24 hr tablet Commonly known as:  PRISTIQ Take 1 tablet (50 mg total) by mouth daily.   EQ VEGETABLE LAXATIVE 8.6 MG tablet Generic drug:  senna Take 1 tablet by mouth daily.   finasteride 5 MG tablet Commonly known as:  PROSCAR Take 5 mg by mouth daily.   Fish Oil 1000 MG Caps Take by mouth 2 (two) times daily.   Melatonin 1 MG Caps Take 3 mg by mouth at bedtime.   ranitidine 300 MG tablet Commonly known as:  ZANTAC Take 1 tablet (300 mg total) by mouth 2 (two) times daily.   rivaroxaban 20 MG Tabs tablet Commonly known as:  XARELTO TAKE 1 TABLET (20 MG TOTAL) BY MOUTH DAILY WITH SUPPER.   sildenafil 100 MG tablet Commonly known as:  VIAGRA Take 1 tablet (100 mg total) by mouth daily as needed.   tamsulosin 0.4 MG Caps capsule Commonly known as:  FLOMAX Take 0.4 mg by mouth daily.   VENTOLIN HFA 108 (90 Base) MCG/ACT inhaler Generic drug:  albuterol INHALE 2 PUFFS INTO THE LUNGS EVERY 4 (FOUR) HOURS AS NEEDED FOR WHEEZING OR SHORTNESS OF BREATH.            Discharge Care Instructions  Start     Ordered   03/31/17 0000  CBC with Differential ([facility])     03/31/17 1552   03/31/17 0000  CMP STAT ([facility] only)     03/31/17 1552      Allergies:  Allergies  Allergen Reactions  . Penicillin G Hives  . Sulfamethoxazole Hives  . Allopurinol Other (See Comments)  . Penicillins   . Sulfonamide Derivatives   . Dilaudid [Hydromorphone Hcl] Nausea And Vomiting    Past Medical History, Surgical history, Social history, and Family History were reviewed and updated.  Review of Systems: As stated in the interim history   Physical Exam:  weight is 237 lb (107.5 kg). His oral temperature is 98.6 F (37 C). His blood pressure is 132/67 and his pulse is 82. His respiration is 18 and oxygen saturation is 98%.   Wt Readings from Last 3 Encounters:  03/31/17 237 lb (107.5 kg)  02/07/17 235 lb (106.6 kg)  09/20/16 242 lb (109.8  kg)    Moderately obese white male. Head and neck exam shows no ocular or oral lesions. There are no palpable cervical or supraclavicular lymph nodes. Lungs are clear bilaterally. Cardiac exam regular rate and rhythm with no murmurs, rubs or bruits. Abdomen is soft. He is moderately obese. Abdomen is soft. He has good bowel sounds. He has no fluid wave. There is no palpable liver or spleen tip. Back exam shows no tenderness over the spine, ribs or hips. Extremities does show some chronic swelling in the right lower leg. He has some swelling about the right knee. He has some tenderness to range of motion of the right knee. He has no palpable venous cord in the right leg. He has good pulses in the distal extremities. Neurological exam shows no focal neurological deficits. Skin exam shows no rashes, ecchymoses or petechia.  Lab Results  Component Value Date   WBC 5.4 03/31/2017   HGB 13.6 03/31/2017   HCT 39.6 03/31/2017   MCV 94 03/31/2017   PLT 195 03/31/2017   No results found for: FERRITIN, IRON, TIBC, UIBC, IRONPCTSAT Lab Results  Component Value Date   RBC 4.23 03/31/2017   No results found for: KPAFRELGTCHN, LAMBDASER, KAPLAMBRATIO No results found for: IGGSERUM, IGA, IGMSERUM No results found for: Ronnald Ramp, A1GS, A2GS, Violet Baldy, MSPIKE, SPEI   Chemistry      Component Value Date/Time   NA 142 03/31/2017 1446   K 3.8 03/31/2017 1446   CL 110 (H) 03/31/2017 1446   CO2 23 03/31/2017 1446   BUN 24 (H) 03/31/2017 1446   CREATININE 2.1 (H) 03/31/2017 1446      Component Value Date/Time   CALCIUM 9.5 03/31/2017 1446   ALKPHOS 90 (H) 03/31/2017 1446   AST 22 03/31/2017 1446   ALT 30 03/31/2017 1446   BILITOT 0.60 03/31/2017 1446     Impression and Plan: Joseph Tran is a 66 yo gentleman with 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    Again, we will have to watch his renal function closely. He has borderline GFR. I think with the low-dose Xarelto we should still be okay.  I don't have any problems with him having surgery on October 12. I told him to stop the Xarelto on Tuesday before surgery. He can restart the Xarelto the day after surgery.  It will be interesting to see what the  nephrologist says about his renal function. Given that he has one kidney that is atrophied, then I would think that this would be something that is vascular in nature.  He has been taking quite a bit of Voltaren for his arthritis. I think he is going to have to stop this given his renal insufficiency.   I spent about 35-40 minutes with him. I went over his labs. I gave him my take on his renal insufficiency and the possible etiologies.  I will like to see him back in a couple months. I want to make sure that he is able to get to our office. As such, I think that we can see him back in early December.   Volanda Napoleon, MD 9/27/20184:44 PM

## 2017-04-01 DIAGNOSIS — N183 Chronic kidney disease, stage 3 (moderate): Secondary | ICD-10-CM | POA: Diagnosis not present

## 2017-04-01 DIAGNOSIS — M3210 Systemic lupus erythematosus, organ or system involvement unspecified: Secondary | ICD-10-CM | POA: Diagnosis not present

## 2017-04-01 DIAGNOSIS — R809 Proteinuria, unspecified: Secondary | ICD-10-CM | POA: Diagnosis not present

## 2017-04-01 DIAGNOSIS — R319 Hematuria, unspecified: Secondary | ICD-10-CM | POA: Diagnosis not present

## 2017-04-01 DIAGNOSIS — E119 Type 2 diabetes mellitus without complications: Secondary | ICD-10-CM | POA: Diagnosis not present

## 2017-04-01 DIAGNOSIS — N189 Chronic kidney disease, unspecified: Secondary | ICD-10-CM | POA: Diagnosis not present

## 2017-04-01 LAB — IRON AND TIBC
%SAT: 35 % (ref 20–55)
Iron: 101 ug/dL (ref 42–163)
TIBC: 286 ug/dL (ref 202–409)
UIBC: 185 ug/dL (ref 117–376)

## 2017-04-01 LAB — D-DIMER, QUANTITATIVE (NOT AT ARMC): D-DIMER: 0.65 mg{FEU}/L — AB (ref 0.00–0.49)

## 2017-04-01 LAB — FERRITIN: FERRITIN: 116 ng/mL (ref 22–316)

## 2017-04-04 ENCOUNTER — Telehealth: Payer: Self-pay | Admitting: *Deleted

## 2017-04-04 NOTE — Telephone Encounter (Addendum)
Patient is aware of results  ----- Message from Volanda Napoleon, MD sent at 04/01/2017  3:57 PM EDT ----- Call - the iron level is ok!!!  Joseph Tran

## 2017-04-05 ENCOUNTER — Other Ambulatory Visit: Payer: Self-pay | Admitting: Family Medicine

## 2017-04-06 DIAGNOSIS — I129 Hypertensive chronic kidney disease with stage 1 through stage 4 chronic kidney disease, or unspecified chronic kidney disease: Secondary | ICD-10-CM | POA: Diagnosis not present

## 2017-04-06 DIAGNOSIS — N189 Chronic kidney disease, unspecified: Secondary | ICD-10-CM | POA: Diagnosis not present

## 2017-04-06 DIAGNOSIS — I1310 Hypertensive heart and chronic kidney disease without heart failure, with stage 1 through stage 4 chronic kidney disease, or unspecified chronic kidney disease: Secondary | ICD-10-CM | POA: Diagnosis not present

## 2017-04-14 DIAGNOSIS — M3210 Systemic lupus erythematosus, organ or system involvement unspecified: Secondary | ICD-10-CM | POA: Diagnosis not present

## 2017-04-15 ENCOUNTER — Other Ambulatory Visit: Payer: Self-pay | Admitting: Orthopedic Surgery

## 2017-04-20 NOTE — Pre-Procedure Instructions (Signed)
HOLLIS TULLER  04/20/2017      [place], [address] [place] [address] Phone: [phone] Fax: [phone]  [facility] - [place], [place] - [address] AT [place] [address] [place] [address] Phone: [phone] Fax: [phone]    Your procedure is scheduled on October 22  Report to [facility] at 1030 A.M.  Call this number if you have problems the morning of surgery:  [phone]   Remember:  Do not eat food or drink liquids after midnight.  Continue all other medications as directed by your physician except follow these medication instructions before surgery   Take these medicines the morning of surgery with A SIP OF WATER  acetaminophen (TYLENOL)  buPROPion (WELLBUTRIN XL) clonazePAM (KLONOPIN) VENTOLIN HFA if needed Eye drops if needed  7 days prior to surgery STOP taking any Aspirin (unless otherwise instructed by your surgeon), Aleve, Naproxen, Ibuprofen, Motrin, Advil, Goody's, BC's, all herbal medications, fish oil, and all vitamins  FOLLOW PHYSICIANS INSTRUCTIONS ABOUT XARELTO   Do not wear jewelry.  Do not wear lotions, powders, or cologne, or deoderant.   Men may shave face and neck.  Do not bring valuables to the hospital.  [facility] is not responsible for any belongings or valuables.  Contacts, dentures or bridgework may not be worn into surgery.  Leave your suitcase in the car.  After surgery it may be brought to your room.  For patients admitted to the hospital, discharge time will be determined by your treatment team.  Patients discharged the day of surgery will not be allowed to drive home.    Special instructions:   [facility]- Preparing For Surgery  Before surgery, you can play an important role. Because skin is not sterile, your skin needs to be as free of germs as possible. You can  reduce the number of germs on your skin by washing with CHG (chlorahexidine gluconate) Soap before surgery.  CHG is an antiseptic cleaner which kills germs and bonds with the skin to continue killing germs even after washing.  Please do not use if you have an allergy to CHG or antibacterial soaps. If your skin becomes reddened/irritated stop using the CHG.  Do not shave (including legs and underarms) for at least 48 hours prior to first CHG shower. It is OK to shave your face.  Please follow these instructions carefully.   1. Shower the NIGHT BEFORE SURGERY and the MORNING OF SURGERY with CHG.   2. If you chose to wash your hair, wash your hair first as usual with your normal shampoo.  3. After you shampoo, rinse your hair and body thoroughly to remove the shampoo.  4. Use CHG as you would any other liquid soap. You can apply CHG directly to the skin and wash gently with a scrungie or a clean washcloth.   5. Apply the CHG Soap to your body ONLY FROM THE NECK DOWN.  Do not use on open wounds or open sores. Avoid contact with your eyes, ears, mouth and genitals (private parts). Wash Face and genitals (private parts)  with your normal soap.  6. Wash thoroughly, paying special attention to the area where your surgery will be performed.  7. Thoroughly rinse your body with warm water from the neck down.  8. DO NOT shower/wash with your normal soap  after using and rinsing off the CHG Soap.  9. Pat yourself dry with a CLEAN TOWEL.  10. Wear CLEAN PAJAMAS to bed the night before surgery, wear comfortable clothes the morning of surgery  11. Place CLEAN SHEETS on your bed the night of your first shower and DO NOT SLEEP WITH PETS.    Day of Surgery: Do not apply any deodorants/lotions. Please wear clean clothes to the hospital/surgery center.      Please read over the following fact sheets that you were given.

## 2017-04-21 ENCOUNTER — Encounter (HOSPITAL_COMMUNITY): Payer: Self-pay

## 2017-04-21 ENCOUNTER — Encounter (HOSPITAL_COMMUNITY)
Admission: RE | Admit: 2017-04-21 | Discharge: 2017-04-21 | Disposition: A | Discharge status: Home / Self Care | Payer: Medicare Other | Source: Ambulatory Visit | Attending: Orthopedic Surgery | Admitting: Orthopedic Surgery

## 2017-04-21 DIAGNOSIS — Z01812 Encounter for preprocedural laboratory examination: Secondary | ICD-10-CM | POA: Insufficient documentation

## 2017-04-21 DIAGNOSIS — K219 Gastro-esophageal reflux disease without esophagitis: Secondary | ICD-10-CM | POA: Diagnosis not present

## 2017-04-21 DIAGNOSIS — N189 Chronic kidney disease, unspecified: Secondary | ICD-10-CM | POA: Insufficient documentation

## 2017-04-21 DIAGNOSIS — G4733 Obstructive sleep apnea (adult) (pediatric): Secondary | ICD-10-CM | POA: Diagnosis not present

## 2017-04-21 DIAGNOSIS — J449 Chronic obstructive pulmonary disease, unspecified: Secondary | ICD-10-CM | POA: Diagnosis not present

## 2017-04-21 DIAGNOSIS — D649 Anemia, unspecified: Secondary | ICD-10-CM | POA: Diagnosis not present

## 2017-04-21 DIAGNOSIS — Z86718 Personal history of other venous thrombosis and embolism: Secondary | ICD-10-CM | POA: Insufficient documentation

## 2017-04-21 HISTORY — DX: Personal history of urinary calculi: Z87.442

## 2017-04-21 HISTORY — DX: Malignant (primary) neoplasm, unspecified: C80.1

## 2017-04-21 HISTORY — DX: Unspecified osteoarthritis, unspecified site: M19.90

## 2017-04-21 HISTORY — DX: Adverse effect of unspecified anesthetic, initial encounter: T41.45XA

## 2017-04-21 HISTORY — DX: Gastro-esophageal reflux disease without esophagitis: K21.9

## 2017-04-21 HISTORY — DX: Chronic kidney disease, unspecified: N18.9

## 2017-04-21 HISTORY — DX: Other complications of anesthesia, initial encounter: T88.59XA

## 2017-04-21 HISTORY — DX: Other specified postprocedural states: Z98.890

## 2017-04-21 HISTORY — DX: Anxiety disorder, unspecified: F41.9

## 2017-04-21 HISTORY — DX: Nausea with vomiting, unspecified: R11.2

## 2017-04-21 LAB — CBC
HEMATOCRIT: 38.3 % — AB (ref 39.0–52.0)
HEMOGLOBIN: 12.8 g/dL — AB (ref 13.0–17.0)
MCH: 31.2 pg (ref 26.0–34.0)
MCHC: 33.4 g/dL (ref 30.0–36.0)
MCV: 93.4 fL (ref 78.0–100.0)
Platelets: 195 10*3/uL (ref 150–400)
RBC: 4.1 MIL/uL — AB (ref 4.22–5.81)
RDW: 12.5 % (ref 11.5–15.5)
WBC: 5 10*3/uL (ref 4.0–10.5)

## 2017-04-21 LAB — BASIC METABOLIC PANEL
ANION GAP: 8 (ref 5–15)
BUN: 25 mg/dL — ABNORMAL HIGH (ref 6–20)
CALCIUM: 8.9 mg/dL (ref 8.9–10.3)
CHLORIDE: 108 mmol/L (ref 101–111)
CO2: 21 mmol/L — AB (ref 22–32)
Creatinine, Ser: 1.9 mg/dL — ABNORMAL HIGH (ref 0.61–1.24)
GFR calc non Af Amer: 35 mL/min — ABNORMAL LOW (ref >60)
GFR, EST AFRICAN AMERICAN: 41 mL/min — AB (ref >60)
Glucose, Bld: 77 mg/dL (ref 65–99)
POTASSIUM: 3.7 mmol/L (ref 3.5–5.1)
Sodium: 137 mmol/L (ref 135–145)

## 2017-04-21 NOTE — Progress Notes (Addendum)
PCP is Dr. Arlys John 12/2016   Hematologist is Dr. Carlynn Spry 03/2017   He is currently being checked out for lupus. Urologist is Dr. Nevada Crane   He currently has left kidney non-functioning, right kidney is at 50% Nephrologist is Dr. Joesph July.  Stopping Xarelton on 10/18  Aspirin has been stopped since 10/16. Currently has blood clot in right leg.  He was told, that clots may have come from taking testosterone or having OSA. Surgical clearance from Dr. Nevada Crane is inside chart. Denies any cardiac issues, no murmur, no cp.  Did have echo of heart while he had vascular check of legs

## 2017-04-22 NOTE — Progress Notes (Signed)
Anesthesia Chart Review:  Pt is a 66 year old male scheduled for R knee arthroscopy on 04/25/2017 with Dorna Leitz, MD  - PCP is [name], MD who cleared pt for surgery.  - Hematologist is [name], MD who has cleared pt for surgery and given ok to hold xarelto.  - Nephrologist is [name], MD - Urologist is Heather Roberts, MD  PMH includes:  COPD, asthma, OSA, anemia, CKD (functional solitary R kidney; marked L kidney atrophy thought due to trauma in 2017), post-op N/V, GERD. Never smoker. BMI 32  - Pt also has DVT (active problem: chronic nonocclusive thrombus of the femoral and occlusive thrombus of the popliteal vein and chronic nearly occlusive superficial thrombus of the right leg).  Hematology has given ok for pt to proceed with surgery at this time and to hold xarelto.   Medications include: ASA 81mg , iron, zantac, xarelto, albuterol. ASA stopped 04/19/17. Stopped xarelto 04/21/17.   BP 120/72   Pulse 71   Temp 36.6 C   Resp 18   Ht 6' (1.829 m)   Wt 237 lb 1.6 oz (107.5 kg)   SpO2 95%   BMI 32.16 kg/m   Preoperative labs reviewed.   - Cr 1.9, BUN 25. This is consistent with prior labs over last year.  - PT will be obtained DOS.   EKG 04/28/16: Sinus  Rhythm. Nonspecific ST depression  -Nondiagnostic.   RLE [place] 02/25/17:  - Persistent thrombus in the distal aspect of the right femoral vein and in the right popliteal vein, similar to prior study. Thrombus in the popliteal vein is occlusive.  If PT acceptable, I anticipate pt can proceed with surgery as scheduled.   Willeen Cass, FNP-BC [facility]/Anesthesiology Phone: [phone] 04/22/2017 2:13 PM

## 2017-04-24 MED ORDER — CLINDAMYCIN PHOSPHATE 900 MG/50ML IV SOLN
900.0000 mg | INTRAVENOUS | Status: AC
  Administered 2017-04-25: 900 mg via INTRAVENOUS
  Filled 2017-04-24: qty 50

## 2017-04-25 ENCOUNTER — Encounter (HOSPITAL_COMMUNITY): Payer: Self-pay | Admitting: *Deleted

## 2017-04-25 ENCOUNTER — Ambulatory Visit (HOSPITAL_COMMUNITY)
Admission: RE | Admit: 2017-04-25 | Discharge: 2017-04-25 | Disposition: A | Discharge status: Home / Self Care | Payer: Medicare Other | Source: Ambulatory Visit | Attending: Orthopedic Surgery | Admitting: Orthopedic Surgery

## 2017-04-25 ENCOUNTER — Ambulatory Visit (HOSPITAL_COMMUNITY): Payer: Medicare Other | Admitting: Emergency Medicine

## 2017-04-25 ENCOUNTER — Encounter (HOSPITAL_COMMUNITY)
Admission: RE | Disposition: A | Discharge status: Home / Self Care | Payer: Self-pay | Source: Ambulatory Visit | Attending: Orthopedic Surgery

## 2017-04-25 DIAGNOSIS — Z79899 Other long term (current) drug therapy: Secondary | ICD-10-CM | POA: Diagnosis not present

## 2017-04-25 DIAGNOSIS — M23341 Other meniscus derangements, anterior horn of lateral meniscus, right knee: Secondary | ICD-10-CM | POA: Diagnosis not present

## 2017-04-25 DIAGNOSIS — E291 Testicular hypofunction: Secondary | ICD-10-CM | POA: Diagnosis not present

## 2017-04-25 DIAGNOSIS — Z8249 Family history of ischemic heart disease and other diseases of the circulatory system: Secondary | ICD-10-CM | POA: Diagnosis not present

## 2017-04-25 DIAGNOSIS — Z880 Allergy status to penicillin: Secondary | ICD-10-CM | POA: Diagnosis not present

## 2017-04-25 DIAGNOSIS — Z6832 Body mass index (BMI) 32.0-32.9, adult: Secondary | ICD-10-CM | POA: Insufficient documentation

## 2017-04-25 DIAGNOSIS — N189 Chronic kidney disease, unspecified: Secondary | ICD-10-CM | POA: Insufficient documentation

## 2017-04-25 DIAGNOSIS — Z885 Allergy status to narcotic agent status: Secondary | ICD-10-CM | POA: Diagnosis not present

## 2017-04-25 DIAGNOSIS — Z86010 Personal history of colonic polyps: Secondary | ICD-10-CM | POA: Insufficient documentation

## 2017-04-25 DIAGNOSIS — Z888 Allergy status to other drugs, medicaments and biological substances status: Secondary | ICD-10-CM | POA: Insufficient documentation

## 2017-04-25 DIAGNOSIS — M23241 Derangement of anterior horn of lateral meniscus due to old tear or injury, right knee: Secondary | ICD-10-CM | POA: Insufficient documentation

## 2017-04-25 DIAGNOSIS — Z85828 Personal history of other malignant neoplasm of skin: Secondary | ICD-10-CM | POA: Insufficient documentation

## 2017-04-25 DIAGNOSIS — M2241 Chondromalacia patellae, right knee: Secondary | ICD-10-CM | POA: Diagnosis not present

## 2017-04-25 DIAGNOSIS — M1990 Unspecified osteoarthritis, unspecified site: Secondary | ICD-10-CM | POA: Diagnosis not present

## 2017-04-25 DIAGNOSIS — G4730 Sleep apnea, unspecified: Secondary | ICD-10-CM | POA: Insufficient documentation

## 2017-04-25 DIAGNOSIS — Z86718 Personal history of other venous thrombosis and embolism: Secondary | ICD-10-CM | POA: Insufficient documentation

## 2017-04-25 DIAGNOSIS — J449 Chronic obstructive pulmonary disease, unspecified: Secondary | ICD-10-CM | POA: Diagnosis not present

## 2017-04-25 DIAGNOSIS — Z882 Allergy status to sulfonamides status: Secondary | ICD-10-CM | POA: Insufficient documentation

## 2017-04-25 DIAGNOSIS — F419 Anxiety disorder, unspecified: Secondary | ICD-10-CM | POA: Insufficient documentation

## 2017-04-25 DIAGNOSIS — Z87442 Personal history of urinary calculi: Secondary | ICD-10-CM | POA: Diagnosis not present

## 2017-04-25 DIAGNOSIS — K219 Gastro-esophageal reflux disease without esophagitis: Secondary | ICD-10-CM | POA: Insufficient documentation

## 2017-04-25 DIAGNOSIS — F329 Major depressive disorder, single episode, unspecified: Secondary | ICD-10-CM | POA: Insufficient documentation

## 2017-04-25 DIAGNOSIS — I82409 Acute embolism and thrombosis of unspecified deep veins of unspecified lower extremity: Secondary | ICD-10-CM | POA: Diagnosis not present

## 2017-04-25 DIAGNOSIS — S83281A Other tear of lateral meniscus, current injury, right knee, initial encounter: Secondary | ICD-10-CM | POA: Diagnosis present

## 2017-04-25 DIAGNOSIS — M23203 Derangement of unspecified medial meniscus due to old tear or injury, right knee: Secondary | ICD-10-CM | POA: Diagnosis present

## 2017-04-25 DIAGNOSIS — Z7982 Long term (current) use of aspirin: Secondary | ICD-10-CM | POA: Insufficient documentation

## 2017-04-25 DIAGNOSIS — M94261 Chondromalacia, right knee: Secondary | ICD-10-CM | POA: Diagnosis not present

## 2017-04-25 DIAGNOSIS — M1711 Unilateral primary osteoarthritis, right knee: Secondary | ICD-10-CM | POA: Diagnosis present

## 2017-04-25 HISTORY — PX: KNEE ARTHROSCOPY: SHX127

## 2017-04-25 LAB — PROTIME-INR
INR: 1
PROTHROMBIN TIME: 13.1 s (ref 11.4–15.2)

## 2017-04-25 SURGERY — ARTHROSCOPY, KNEE
Anesthesia: General | Site: Knee | Laterality: Right

## 2017-04-25 MED ORDER — FENTANYL CITRATE (PF) 250 MCG/5ML IJ SOLN
INTRAMUSCULAR | Status: AC
  Filled 2017-04-25: qty 5

## 2017-04-25 MED ORDER — PHENYLEPHRINE 40 MCG/ML (10ML) SYRINGE FOR IV PUSH (FOR BLOOD PRESSURE SUPPORT)
PREFILLED_SYRINGE | INTRAVENOUS | Status: AC
  Filled 2017-04-25: qty 10

## 2017-04-25 MED ORDER — HYDROCODONE-ACETAMINOPHEN 5-325 MG PO TABS: 1.0000 | ORAL_TABLET | Freq: Four times a day (QID) | ORAL | 0 refills | Status: DC | PRN

## 2017-04-25 MED ORDER — LIDOCAINE 2% (20 MG/ML) 5 ML SYRINGE
INTRAMUSCULAR | Status: AC
  Filled 2017-04-25: qty 5

## 2017-04-25 MED ORDER — LIDOCAINE 2% (20 MG/ML) 5 ML SYRINGE
INTRAMUSCULAR | Status: DC | PRN
  Administered 2017-04-25: 60 mg via INTRAVENOUS

## 2017-04-25 MED ORDER — EPINEPHRINE PF 1 MG/ML IJ SOLN
INTRAMUSCULAR | Status: DC | PRN
  Administered 2017-04-25: 1 mg

## 2017-04-25 MED ORDER — CHLORHEXIDINE GLUCONATE 4 % EX LIQD: 60.0000 mL | Freq: Once | CUTANEOUS | Status: DC

## 2017-04-25 MED ORDER — EPHEDRINE SULFATE 50 MG/ML IJ SOLN
INTRAMUSCULAR | Status: DC | PRN
  Administered 2017-04-25 (×5): 10 mg via INTRAVENOUS

## 2017-04-25 MED ORDER — DEXAMETHASONE SODIUM PHOSPHATE 10 MG/ML IJ SOLN
INTRAMUSCULAR | Status: DC | PRN
  Administered 2017-04-25: 10 mg via INTRAVENOUS

## 2017-04-25 MED ORDER — SODIUM CHLORIDE 0.9 % IR SOLN
Status: DC | PRN
  Administered 2017-04-25: 3000 mL

## 2017-04-25 MED ORDER — FENTANYL CITRATE (PF) 100 MCG/2ML IJ SOLN
INTRAMUSCULAR | Status: DC | PRN
  Administered 2017-04-25: 100 ug via INTRAVENOUS
  Administered 2017-04-25: 50 ug via INTRAVENOUS

## 2017-04-25 MED ORDER — LACTATED RINGERS IV SOLN
INTRAVENOUS | Status: DC | PRN
  Administered 2017-04-25 (×2): via INTRAVENOUS

## 2017-04-25 MED ORDER — PROPOFOL 10 MG/ML IV BOLUS
INTRAVENOUS | Status: DC | PRN
  Administered 2017-04-25: 150 mg via INTRAVENOUS
  Administered 2017-04-25: 50 mg via INTRAVENOUS

## 2017-04-25 MED ORDER — ONDANSETRON HCL 4 MG/2ML IJ SOLN
INTRAMUSCULAR | Status: AC
  Filled 2017-04-25: qty 2

## 2017-04-25 MED ORDER — ONDANSETRON HCL 4 MG/2ML IJ SOLN
INTRAMUSCULAR | Status: DC | PRN
  Administered 2017-04-25: 4 mg via INTRAVENOUS

## 2017-04-25 MED ORDER — MIDAZOLAM HCL 2 MG/2ML IJ SOLN
INTRAMUSCULAR | Status: AC
  Filled 2017-04-25: qty 2

## 2017-04-25 MED ORDER — MIDAZOLAM HCL 5 MG/5ML IJ SOLN
INTRAMUSCULAR | Status: DC | PRN
  Administered 2017-04-25: 2 mg via INTRAVENOUS

## 2017-04-25 MED ORDER — EPINEPHRINE PF 1 MG/ML IJ SOLN
INTRAMUSCULAR | Status: AC
  Filled 2017-04-25: qty 1

## 2017-04-25 MED ORDER — HYDROMORPHONE HCL 1 MG/ML IJ SOLN: 0.2500 mg | INTRAMUSCULAR | Status: DC | PRN

## 2017-04-25 MED ORDER — MEPERIDINE HCL 25 MG/ML IJ SOLN: 6.2500 mg | INTRAMUSCULAR | Status: DC | PRN

## 2017-04-25 MED ORDER — PROPOFOL 10 MG/ML IV BOLUS
INTRAVENOUS | Status: AC
  Filled 2017-04-25: qty 20

## 2017-04-25 MED ORDER — EPHEDRINE 5 MG/ML INJ
INTRAVENOUS | Status: AC
  Filled 2017-04-25: qty 10

## 2017-04-25 MED ORDER — BUPIVACAINE HCL (PF) 0.25 % IJ SOLN
INTRAMUSCULAR | Status: AC
  Filled 2017-04-25: qty 30

## 2017-04-25 MED ORDER — PHENYLEPHRINE HCL 10 MG/ML IJ SOLN
INTRAMUSCULAR | Status: DC | PRN
  Administered 2017-04-25 (×3): 80 ug via INTRAVENOUS

## 2017-04-25 MED ORDER — MIDAZOLAM HCL 2 MG/2ML IJ SOLN: 0.5000 mg | Freq: Once | INTRAMUSCULAR | Status: DC | PRN

## 2017-04-25 MED ORDER — DEXAMETHASONE SODIUM PHOSPHATE 10 MG/ML IJ SOLN
INTRAMUSCULAR | Status: AC
  Filled 2017-04-25: qty 1

## 2017-04-25 MED ORDER — PROMETHAZINE HCL 25 MG/ML IJ SOLN: 6.2500 mg | INTRAMUSCULAR | Status: DC | PRN

## 2017-04-25 MED ORDER — SODIUM CHLORIDE 0.9 % IV SOLN
INTRAVENOUS | Status: DC
  Administered 2017-04-25: 10 mL/h via INTRAVENOUS

## 2017-04-25 SURGICAL SUPPLY — 42 items
BANDAGE ACE 6X5 VEL STRL LF (GAUZE/BANDAGES/DRESSINGS) IMPLANT
BANDAGE ELASTIC 6 VELCRO ST LF (GAUZE/BANDAGES/DRESSINGS) ×3 IMPLANT
BLADE CUDA 5.5 (BLADE) ×3 IMPLANT
BLADE CUTTER GATOR 3.5 (BLADE) IMPLANT
BLADE GREAT WHITE 4.2 (BLADE) ×2 IMPLANT
BLADE GREAT WHITE 4.2MM (BLADE) ×1
BNDG GAUZE ELAST 4 BULKY (GAUZE/BANDAGES/DRESSINGS) ×3 IMPLANT
CUFF TOURNIQUET SINGLE 34IN LL (TOURNIQUET CUFF) ×3 IMPLANT
CUFF TOURNIQUET SINGLE 44IN (TOURNIQUET CUFF) IMPLANT
DRAPE ARTHROSCOPY W/POUCH 114 (DRAPES) ×3 IMPLANT
DRAPE HALF SHEET 40X57 (DRAPES) ×3 IMPLANT
DRAPE U-SHAPE 47X51 STRL (DRAPES) ×3 IMPLANT
DRSG EMULSION OIL 3X3 NADH (GAUZE/BANDAGES/DRESSINGS) ×3 IMPLANT
DRSG PAD ABDOMINAL 8X10 ST (GAUZE/BANDAGES/DRESSINGS) ×3 IMPLANT
DURAPREP 26ML APPLICATOR (WOUND CARE) ×3 IMPLANT
FILTER STRAW FLUID ASPIR (MISCELLANEOUS) IMPLANT
GAUZE SPONGE 4X4 12PLY STRL (GAUZE/BANDAGES/DRESSINGS) ×3 IMPLANT
GLOVE BIOGEL PI IND STRL 8 (GLOVE) ×2 IMPLANT
GLOVE BIOGEL PI INDICATOR 8 (GLOVE) ×4
GLOVE ECLIPSE 7.5 STRL STRAW (GLOVE) ×6 IMPLANT
GOWN STRL REUS W/ TWL LRG LVL3 (GOWN DISPOSABLE) ×1 IMPLANT
GOWN STRL REUS W/ TWL XL LVL3 (GOWN DISPOSABLE) ×2 IMPLANT
GOWN STRL REUS W/TWL LRG LVL3 (GOWN DISPOSABLE) ×2
GOWN STRL REUS W/TWL XL LVL3 (GOWN DISPOSABLE) ×4
KIT BASIN OR (CUSTOM PROCEDURE TRAY) ×3 IMPLANT
KIT ROOM TURNOVER OR (KITS) ×3 IMPLANT
NEEDLE 18GX1X1/2 (RX/OR ONLY) (NEEDLE) ×3 IMPLANT
NEEDLE FILTER BLUNT 18X 1/2SAF (NEEDLE) ×2
NEEDLE FILTER BLUNT 18X1 1/2 (NEEDLE) ×1 IMPLANT
PACK ARTHROSCOPY DSU (CUSTOM PROCEDURE TRAY) ×3 IMPLANT
PAD ARMBOARD 7.5X6 YLW CONV (MISCELLANEOUS) ×6 IMPLANT
PAD CAST 4YDX4 CTTN HI CHSV (CAST SUPPLIES) IMPLANT
PADDING CAST COTTON 4X4 STRL (CAST SUPPLIES)
SET ARTHROSCOPY TUBING (MISCELLANEOUS) ×2
SET ARTHROSCOPY TUBING LN (MISCELLANEOUS) ×1 IMPLANT
SPONGE LAP 4X18 X RAY DECT (DISPOSABLE) ×3 IMPLANT
SUT ETHILON 4 0 PS 2 18 (SUTURE) ×3 IMPLANT
SYR 3ML LL SCALE MARK (SYRINGE) ×3 IMPLANT
SYR 5ML LL (SYRINGE) IMPLANT
TOWEL OR 17X24 6PK STRL BLUE (TOWEL DISPOSABLE) IMPLANT
TOWEL OR 17X26 10 PK STRL BLUE (TOWEL DISPOSABLE) IMPLANT
WRAP KNEE MAXI GEL POST OP (GAUZE/BANDAGES/DRESSINGS) ×3 IMPLANT

## 2017-04-25 NOTE — Anesthesia Postprocedure Evaluation (Signed)
Anesthesia Post Note  Patient: TOSHIYUKI FREDELL  Procedure(s) Performed: RIGHT KNEE ARTHROSCOPY, PARTIAL LATERAL MENISECTOMY, CHONDROPLASTY MEDIAL AND LATERAL PATELLAOFEMORAL (Right Knee)     Patient location during evaluation: PACU Anesthesia Type: General Level of consciousness: awake and alert, patient cooperative and oriented Pain management: pain level controlled Vital Signs Assessment: post-procedure vital signs reviewed and stable Respiratory status: spontaneous breathing, nonlabored ventilation and respiratory function stable Cardiovascular status: blood pressure returned to baseline and stable Postop Assessment: no apparent nausea or vomiting and adequate PO intake Anesthetic complications: no    Last Vitals:  Vitals:   04/25/17 1603 04/25/17 1605  BP:  132/75  Pulse:  79  Resp:  11  Temp: (!) 36.1 C   SpO2:  94%    Last Pain:  Vitals:   04/25/17 1038  PainSc: 5     LLE Motor Response: Purposeful movement;Responds to commands (04/25/17 1605) LLE Sensation: No tingling;No pain;No numbness (04/25/17 1605) RLE Motor Response: Purposeful movement;Responds to commands (04/25/17 1605) RLE Sensation: No tingling;No pain;No numbness (04/25/17 1605)      Kaydra Borgen,E. Mckinzey Entwistle

## 2017-04-25 NOTE — Brief Op Note (Signed)
04/25/2017  3:11 PM  PATIENT:  Joseph Tran  66 y.o. male  PRE-OPERATIVE DIAGNOSIS:  LATERAL MENISCUS TEAR  POST-OPERATIVE DIAGNOSIS:  LATERAL MENISCUS TEAR  PROCEDURE:  Procedure(s): RIGHT KNEE ARTHROSCOPY, PARTIAL LATERAL MENISECTOMY, CHONDROPLASTY MEDIAL AND LATERAL PATELLAOFEMORAL (Right)  SURGEON:  Surgeon(s) and Role:    Dorna Leitz, MD - Primary  PHYSICIAN ASSISTANT:   ASSISTANTS: bethune   ANESTHESIA:   general  EBL:  15 mL   BLOOD ADMINISTERED:none  DRAINS: none   LOCAL MEDICATIONS USED:  MARCAINE     SPECIMEN:  No Specimen  DISPOSITION OF SPECIMEN:  N/A  COUNTS:  YES  TOURNIQUET:  * No tourniquets in log *  DICTATION: .Other Dictation: Dictation Number [id number]  PLAN OF CARE: Discharge to home after PACU  PATIENT DISPOSITION:  PACU - hemodynamically stable.   Delay start of Pharmacological VTE agent (>24hrs) due to surgical blood loss or risk of bleeding: no

## 2017-04-25 NOTE — H&P (Signed)
PREOPERATIVE H&P  Chief Complaint: Right knee pain  HPI: Joseph Tran is a 66 y.o. male who presents for evaluation of right knee pain. It has been present for Greater than 3 months and has been worsening. He has failed conservative measures. Pain is rated as moderate.  Past Medical History:  Diagnosis Date  . Acoustic neuroma (HCC)    benign - left  . Anemia   . Anxiety   . Arthritis   . Asthma    seasonal, when pollen is high, cold air closes me up  . Cancer ([place])    skin cancer -- arm, scalp, upper back  . Chronic kidney disease    insufficieny.  left kidney is non=functioning.  right kidny is at 50%  . Colon polyps   . Complication of anesthesia   . COPD (chronic obstructive pulmonary disease) ([place])   . Depression    severe.  dx 1985  . DVT (deep venous thrombosis) ([place])   . GERD (gastroesophageal reflux disease)   . History of kidney stones    has kidney stone now.    . Hypogonadism male    sees Dr. Karsten Ro   . Plantar fasciitis    rt foot  . PONV (postoperative nausea and vomiting)   . Sleep apnea    tested 2010     Past Surgical History:  Procedure Laterality Date  . CHEST WALL TUMOR EXCISION  2007  . COLONOSCOPY  10/2009   in [place], clear, repeat in 10 yrs   . kidney caluculs  2004-05  . KNEE ARTHROSCOPY  09-10-11   right knee, per Dr. Dorna Leitz   . madiscus cartilage  2009  . right knee arthroscopy  2008  . TONSILLECTOMY     Social History   Social History  . Marital status: Single    Spouse name: N/A  . Number of children: N/A  . Years of education: N/A   Social History Main Topics  . Smoking status: Never Smoker  . Smokeless tobacco: Never Used     Comment: NEVER USED TOBACCO  . Alcohol use No  . Drug use: No  . Sexual activity: Not on file   Other Topics Concern  . Not on file   Social History Narrative  . No narrative on file   Family History  Problem Relation Age of Onset  . Heart disease Unknown        parents  .  Hyperlipidemia Unknown   . Stroke Unknown        grandparents  . Sudden death Unknown        uncle less than 66 yrs old  . Heart disease Father    Allergies  Allergen Reactions  . Penicillins Hives    Has patient had a PCN reaction causing immediate rash, facial/tongue/throat swelling, SOB or lightheadedness with hypotension: Unknown Has patient had a PCN reaction causing severe rash involving mucus membranes or skin necrosis:Unknown Has patient had a PCN reaction that required hospitalization: No Has patient had a PCN reaction occurring within the last 10 years: No If all of the above answers are "NO", then may proceed with Cephalosporin use.   . Sulfamethoxazole Hives  . Sulfonamide Derivatives Hives  . Allopurinol Other (See Comments)    PATIENT PREFERENCE Pt refused due to mom developing steven johnson syndrome.  . Dilaudid [Hydromorphone Hcl] Nausea And Vomiting   Prior to Admission medications   Medication Sig Start Date End Date Taking? Authorizing Provider  acetaminophen (TYLENOL 8  HOUR ARTHRITIS PAIN) 650 MG CR tablet Take 1,300 mg by mouth at bedtime.   Yes [provider]  acetaminophen (TYLENOL) 500 MG tablet Take 1,000 mg by mouth daily.   Yes [provider]  aspirin EC 81 MG tablet Take 162 mg by mouth daily.   Yes [provider]  bisacodyl (STIMULANT LAXATIVE) 5 MG EC tablet Take 5 mg by mouth at bedtime.   Yes [provider]  buPROPion (WELLBUTRIN XL) 300 MG 24 hr tablet TAKE 1 TABLET (300 MG TOTAL) BY MOUTH DAILY. 09/01/16  Yes [name], MD  clonazePAM (KLONOPIN) 1 MG tablet TAKE 1 TABLET BY MOUTH TWICE A DAY AS NEEDED FOR ANXIETY Patient taking differently: Take 1 mg by mouth at bedtime. TAKE 1 TABLET BY MOUTH TWICE A DAY AS NEEDED FOR ANXIETY 10/18/16  Yes [name], MD  desvenlafaxine (PRISTIQ) 50 MG 24 hr tablet Take 1 tablet (50 mg total) by mouth daily. 08/17/16  Yes [name], MD  finasteride (PROSCAR) 5 MG  tablet Take 5 mg by mouth at bedtime.    Yes [provider]  GAMMA AMINOBUTYRIC ACID PO Take 750 mg by mouth daily. GABA   Yes [provider]  hydroxypropyl methylcellulose / hypromellose (ISOPTO TEARS / GONIOVISC) 2.5 % ophthalmic solution Place 1-2 drops into both eyes 3 (three) times daily as needed for dry eyes ((SCHEDULED EACH MORNING)).   Yes [provider]  Melatonin 3 MG TBDP Take 3 mg by mouth at bedtime.   Yes [provider]  Omega-3 Fatty Acids (FISH OIL) 1200 MG CAPS Take 1,200 mg by mouth 2 (two) times daily.   Yes [provider]  psyllium (METAMUCIL SMOOTH TEXTURE) 28 % packet Take 1 packet by mouth at bedtime.   Yes [provider]  rivaroxaban (XARELTO) 20 MG TABS tablet TAKE 1 TABLET (20 MG TOTAL) BY MOUTH DAILY WITH SUPPER. Patient taking differently: Take 10 mg by mouth daily with supper. TAKE 1 TABLET (20 MG TOTAL) BY MOUTH DAILY WITH SUPPER. 03/02/17  Yes Ennever, Rudell Cobb, MD  senna (EQ VEGETABLE LAXATIVE) 8.6 MG tablet Take 1 tablet by mouth daily.   Yes [provider]  tamsulosin (FLOMAX) 0.4 MG CAPS Take 0.4 mg by mouth at bedtime.    Yes [provider]  VENTOLIN HFA 108 (90 Base) MCG/ACT inhaler INHALE 2 PUFFS INTO THE LUNGS EVERY 4 (FOUR) HOURS AS NEEDED FOR WHEEZING OR SHORTNESS OF BREATH. 12/08/15  Yes [name], MD  ferrous sulfate 325 (65 FE) MG tablet Take 325 mg by mouth daily with breakfast.    [provider]  Multiple Vitamin (MULTIVITAMIN WITH MINERALS) TABS tablet Take 1 tablet by mouth daily.    [provider]  ranitidine (ZANTAC) 300 MG tablet Take 1 tablet (300 mg total) by mouth 2 (two) times daily. Patient not taking: Reported on 04/19/2017 02/18/17   [name], MD     Positive ROS: None  All other systems have been reviewed and were otherwise negative with the exception of those mentioned in the HPI and as above.  Physical Exam: There were no vitals  filed for this visit.  General: Alert, no acute distress Cardiovascular: No pedal edema Respiratory: No cyanosis, no use of accessory musculature GI: No organomegaly, abdomen is soft and non-tender Skin: No lesions in the area of chief complaint Neurologic: Sensation intact distally Psychiatric: Patient is competent for consent with normal mood and affect Lymphatic: No axillary  or cervical lymphadenopathy  MUSCULOSKELETAL: right knee: Painful range of motion.  Limited range of motion.  Trace effusion.  Lateral joint line tenderness.  Positive McMurray.  Assessment/Plan: LATERAL MENISCUS TEARRight knee Plan for Procedure(s): ARTHROSCOPY KNEE Right knee  The risks benefits and alternatives were discussed with the patient including but not limited to the risks of nonoperative treatment, versus surgical intervention including infection, bleeding, nerve injury, malunion, nonunion, hardware prominence, hardware failure, need for hardware removal, blood clots, cardiopulmonary complications, morbidity, mortality, among others, and they were willing to proceed.  Predicted outcome is good, although there will be at least a six to nine month expected recovery.  Jaydynn Wolford L, MD 04/25/2017 9:39 AM

## 2017-04-25 NOTE — Anesthesia Procedure Notes (Signed)
Procedure Name: LMA Insertion Date/Time: 04/25/2017 2:29 PM Performed by: Lieutenant Diego Pre-anesthesia Checklist: Patient identified, Emergency Drugs available, Suction available and Patient being monitored Patient Re-evaluated:Patient Re-evaluated prior to induction Oxygen Delivery Method: Circle system utilized Preoxygenation: Pre-oxygenation with 100% oxygen Induction Type: IV induction Ventilation: Mask ventilation without difficulty LMA: LMA inserted LMA Size: 5.0 Number of attempts: 1 Placement Confirmation: positive ETCO2 and breath sounds checked- equal and bilateral Tube secured with: Tape Dental Injury: Teeth and Oropharynx as per pre-operative assessment

## 2017-04-25 NOTE — Transfer of Care (Signed)
Immediate Anesthesia Transfer of Care Note  Patient: Joseph Tran  Procedure(s) Performed: RIGHT KNEE ARTHROSCOPY, PARTIAL LATERAL MENISECTOMY, CHONDROPLASTY MEDIAL AND LATERAL PATELLAOFEMORAL (Right Knee)  Patient Location: PACU  Anesthesia Type:General  Level of Consciousness: awake and alert   Airway & Oxygen Therapy: Patient Spontanous Breathing and Patient connected to face mask oxygen  Post-op Assessment: Report given to RN and Post -op Vital signs reviewed and stable  Post vital signs: Reviewed and stable  Last Vitals:  Vitals:   04/25/17 1038  BP: (!) 143/86  Pulse: 71  Resp: 20  Temp: (!) 36.4 C  SpO2: 98%    Last Pain:  Vitals:   04/25/17 1038  PainSc: 5       Patients Stated Pain Goal: 8 (89/38/10 1751)  Complications: No apparent anesthesia complications

## 2017-04-25 NOTE — Anesthesia Preprocedure Evaluation (Addendum)
Anesthesia Evaluation  Patient identified by MRN, date of birth, ID band Patient awake    Reviewed: Allergy & Precautions, NPO status , Patient's Chart, lab work & pertinent test results  History of Anesthesia Complications (+) PONV  Airway Mallampati: II  TM Distance: >3 FB Neck ROM: Full    Dental  (+) Dental Advisory Given   Pulmonary sleep apnea and Continuous Positive Airway Pressure Ventilation , COPD,  COPD inhaler,    breath sounds clear to auscultation       Cardiovascular + DVT  negative cardio ROS   Rhythm:Regular Rate:Normal     Neuro/Psych Anxiety Depression negative neurological ROS     GI/Hepatic Neg liver ROS, GERD  Medicated and Controlled,  Endo/Other  Morbid obesity  Renal/GU Renal InsufficiencyRenal disease (creat 1.9)     Musculoskeletal  (+) Arthritis , Osteoarthritis,    Abdominal (+) + obese,   Peds  Hematology Xarelto: last dose 10/19   Anesthesia Other Findings   Reproductive/Obstetrics                            Anesthesia Physical Anesthesia Plan  ASA: III  Anesthesia Plan: General   Post-op Pain Management:    Induction: Intravenous  PONV Risk Score and Plan: 2 and Ondansetron and Dexamethasone  Airway Management Planned: LMA  Additional Equipment:   Intra-op Plan:   Post-operative Plan:   Informed Consent: I have reviewed the patients History and Physical, chart, labs and discussed the procedure including the risks, benefits and alternatives for the proposed anesthesia with the patient or authorized representative who has indicated his/her understanding and acceptance.   Dental advisory given  Plan Discussed with: CRNA and Surgeon  Anesthesia Plan Comments: (Plan routine monitors, GA- LMA OK)        Anesthesia Quick Evaluation

## 2017-04-25 NOTE — Discharge Instructions (Signed)
POST-OP KNEE ARTHROSCOPY INSTRUCTIONS  Dr. Alain Marion PA-C  Pain You will be expected to have a moderate amount of pain in the affected knee for approximately two weeks. However, the first two days will be the most severe pain. A prescription has been provided to take as needed for the pain. The pain can be reduced by applying ice packs to the knee for the first 1-2 weeks post surgery. Also, keeping the leg elevated on pillows will help alleviate the pain. If you develop any acute pain or swelling in your calf muscle, please call the doctor.  Activity It is preferred that you stay at bed rest for approximately 24 hours. However, you may go to the bathroom with help. Weight bearing as tolerated. You may begin the knee exercises the day of surgery. Discontinue crutches as the knee pain resolves.  Dressing Keep the dressing dry. If the ace bandage should wrinkle or roll up, this can be rewrapped to prevent ridges in the bandage. You may remove all dressings in 48 hours,  apply bandaids to each wound. You may shower on the 4th day after surgery but no tub bath.  Symptoms to report to your doctor Extreme pain Extreme swelling Temperature above 101 degrees Change in the feeling, color, or movement of your toes Redness, heat, or swelling at your incision  Exercise If is preferred that as soon as possible you try to do a straight leg raise without bending the knee and concentrate on bringing the heel of your foot off the bed up to approximately 45 degrees and hold for the count of 10 seconds. Repeat this at least 10 times three or four times per day. Additional exercises are provided below.  You are encouraged to bend the knee as tolerated.  Follow-Up Call to schedule a follow-up appointment in 5-7 days. Our office # is [phone].  POST-OP EXERCISES  Short Arc Quads  1. Lie on back with legs straight. Place towel roll under thigh, just above knee. 2. Tighten thigh muscles to  straighten knee and lift heel off bed. 3. Hold for slow count of five, then lower. 4. Do three sets of ten    Straight Leg Raises  1. Lie on back with operative leg straight and other leg bent. 2. Keeping operative leg completely straight, slowly lift operative leg so foot is 5 inches off bed. 3. Hold for slow count of five, then lower. 4. Do three sets of ten.    DO BOTH EXERCISES 2 TIMES A DAY  Ankle Pumps  Work/move the operative ankle and foot up and down 10 times every hour while awake.     RESTART BLOOD THINNER TOMORROW TUESDAY 23

## 2017-04-26 ENCOUNTER — Encounter (HOSPITAL_COMMUNITY): Payer: Self-pay | Admitting: Orthopedic Surgery

## 2017-04-26 NOTE — Op Note (Signed)
NAME:  Joseph Tran, Joseph Tran                     ACCOUNT NO.:  MEDICAL RECORD NO.:  [id number]  LOCATION:                                 FACILITY:  PHYSICIAN:  Alta Corning, M.D.        DATE OF BIRTH:  DATE OF PROCEDURE:  04/25/2017 DATE OF DISCHARGE:                              OPERATIVE REPORT   PREOPERATIVE DIAGNOSIS:  Suspected medial meniscal tear.  POSTOPERATIVE DIAGNOSES: 1. Anterior horn lateral meniscal tear. 2. Severe chondromalacia of the lateral compartment. 3. Chondromalacia of the medial compartment and patellofemoral     compartment.  PRINCIPAL PROCEDURES: 1. Partial lateral meniscectomy. 2. Chondroplasty lateral femoral condyle and lateral tibial plateau     down to bleeding bone. 3. Chondroplasty medial femoral condyle and patellofemoral joint down     to bleeding bone were necessary.  SURGEON:  Alta Corning, M.D.  ASSISTANT:  Gary Fleet, P.A.  ANESTHESIA:  General.  BRIEF HISTORY:  [name] is a 66 year old male with a long history and significant complaints of right knee locking, catching, and giving out. He had evaluation in the office, which showed narrowing, but not frank bone-on-bone change on his x-rays.  After failure of conservative care, he was taken to the operating room for right knee arthroscopy.  DESCRIPTION OF PROCEDURE:  The patient was taken to the operating room. After adequate anesthesia was obtained with general anesthetic, the patient was placed supine on the operating room table.  The right leg was prepped and draped in usual sterile fashion.  Following this, routine arthroscopic examination of the knee revealed there was obvious chondromalacia in the lateral compartment with severe bone-on-bone change in the femoral condyle over a large area probably 3 x 3 cm, tibial plateau was 3 x 3 cm.  There was a large anterior horn lateral meniscal tear with displaced flap.  Following this, attention was turned to the medial compartment,  where there was no medial meniscal tear. There was evidence of a previous medial meniscectomy, but no new tear. Chondromalacia of the medial femoral condyle was identified and debrided.  In the patellofemoral joint, there were grade 2, 3, and some areas of grade 4 change of the cartilage which were debrided back to a smooth and stable rim.  The knee was copiously and thoroughly lavaged and suctioned dry.  On the lateral side, we did find about 1 x 1 cm area of basically osteochondral fragment that was catching in the joint.  We were able to flip this down and remove with a grasper and then debrided the remaining base of this hole.  At this point, the wound was irrigated, suctioned dry.  Sterile compressive dressing was applied after 20 mL of 0.25% Marcaine was instilled into the knee for postoperative anesthesia.  The patient was then taken to recovery, was noted to be in satisfactory condition.     Alta Corning, M.D.     Corliss Skains  D:  04/25/2017  T:  04/26/2017  Job:  [id number]  cc:   Alta Corning, M.D.

## 2017-05-02 DIAGNOSIS — R319 Hematuria, unspecified: Secondary | ICD-10-CM | POA: Diagnosis not present

## 2017-05-02 DIAGNOSIS — M3210 Systemic lupus erythematosus, organ or system involvement unspecified: Secondary | ICD-10-CM | POA: Diagnosis not present

## 2017-05-02 DIAGNOSIS — Z4789 Encounter for other orthopedic aftercare: Secondary | ICD-10-CM | POA: Diagnosis not present

## 2017-05-02 DIAGNOSIS — M1711 Unilateral primary osteoarthritis, right knee: Secondary | ICD-10-CM | POA: Diagnosis not present

## 2017-05-02 DIAGNOSIS — Z9889 Other specified postprocedural states: Secondary | ICD-10-CM | POA: Diagnosis not present

## 2017-05-02 DIAGNOSIS — M25561 Pain in right knee: Secondary | ICD-10-CM | POA: Diagnosis not present

## 2017-05-24 ENCOUNTER — Other Ambulatory Visit: Payer: Self-pay | Admitting: Family Medicine

## 2017-05-24 NOTE — Telephone Encounter (Signed)
Sent to PCP for approval.  

## 2017-06-06 DIAGNOSIS — R768 Other specified abnormal immunological findings in serum: Secondary | ICD-10-CM | POA: Diagnosis not present

## 2017-06-06 DIAGNOSIS — M3210 Systemic lupus erythematosus, organ or system involvement unspecified: Secondary | ICD-10-CM | POA: Diagnosis not present

## 2017-06-06 DIAGNOSIS — N200 Calculus of kidney: Secondary | ICD-10-CM | POA: Diagnosis not present

## 2017-06-06 DIAGNOSIS — N261 Atrophy of kidney (terminal): Secondary | ICD-10-CM | POA: Diagnosis not present

## 2017-06-06 DIAGNOSIS — R7989 Other specified abnormal findings of blood chemistry: Secondary | ICD-10-CM | POA: Diagnosis not present

## 2017-06-06 DIAGNOSIS — Z87442 Personal history of urinary calculi: Secondary | ICD-10-CM | POA: Diagnosis not present

## 2017-06-06 DIAGNOSIS — Z9989 Dependence on other enabling machines and devices: Secondary | ICD-10-CM | POA: Diagnosis not present

## 2017-06-06 DIAGNOSIS — H04123 Dry eye syndrome of bilateral lacrimal glands: Secondary | ICD-10-CM | POA: Diagnosis not present

## 2017-06-06 DIAGNOSIS — Z79899 Other long term (current) drug therapy: Secondary | ICD-10-CM | POA: Diagnosis not present

## 2017-06-07 DIAGNOSIS — M67911 Unspecified disorder of synovium and tendon, right shoulder: Secondary | ICD-10-CM | POA: Diagnosis not present

## 2017-06-07 DIAGNOSIS — M1711 Unilateral primary osteoarthritis, right knee: Secondary | ICD-10-CM | POA: Diagnosis not present

## 2017-06-07 DIAGNOSIS — M67912 Unspecified disorder of synovium and tendon, left shoulder: Secondary | ICD-10-CM | POA: Diagnosis not present

## 2017-06-08 ENCOUNTER — Telehealth: Payer: Self-pay | Admitting: Family Medicine

## 2017-06-08 NOTE — Telephone Encounter (Signed)
Copied from [place] [id number]. Topic: Quick Communication - Rx Refill/Question >> Jun 08, 2017  3:13 PM Arletha Grippe wrote: Has the patient contacted their pharmacy? No. New rx new pharmacy    (Agent: If no, request that the patient contact the pharmacy for the refill.)   Preferred Pharmacy (with phone number or street name): new rx for pro-air and also would like to know why desvenlafaxine (PRISTIQ) 50 MG 24 hr tablet. [facility] meds [facility] cb number is [phone]     Agent: Please be advised that RX refills may take up to 3 business days. We ask that you follow-up with your pharmacy.

## 2017-06-09 ENCOUNTER — Other Ambulatory Visit: Payer: Medicare Other

## 2017-06-09 ENCOUNTER — Ambulatory Visit: Payer: Medicare Other | Admitting: Hematology & Oncology

## 2017-06-09 ENCOUNTER — Other Ambulatory Visit: Payer: Self-pay | Admitting: *Deleted

## 2017-06-09 NOTE — Telephone Encounter (Signed)
Please review for refill, Proair and Pristiq. I did not see a prescription for Proair.

## 2017-06-10 NOTE — Telephone Encounter (Signed)
I see Venrolin on pt's medication list but NOT proair sent to PCP for approval to fill.

## 2017-06-10 NOTE — Telephone Encounter (Signed)
Please refill either Proair or Ventolin (his insurance company likely prefers one over the other) but its the same medication (albuterol) . Also refill Pristiq for one year

## 2017-06-13 MED ORDER — ALBUTEROL SULFATE HFA 108 (90 BASE) MCG/ACT IN AERS: INHALATION_SPRAY | RESPIRATORY_TRACT | 11 refills | Status: DC

## 2017-06-13 MED ORDER — DESVENLAFAXINE SUCCINATE ER 50 MG PO TB24: 50.0000 mg | ORAL_TABLET | Freq: Every day | ORAL | 2 refills | Status: DC

## 2017-06-13 NOTE — Telephone Encounter (Signed)
Medications filled to pharmacy as requested.  

## 2017-06-17 NOTE — Telephone Encounter (Signed)
Insurance requires PA for ventolin, please advise. You may call with Josem Kaufmann number to pharmacy [phone] or fax [phone]

## 2017-06-20 DIAGNOSIS — Z85828 Personal history of other malignant neoplasm of skin: Secondary | ICD-10-CM | POA: Diagnosis not present

## 2017-06-20 DIAGNOSIS — D485 Neoplasm of uncertain behavior of skin: Secondary | ICD-10-CM | POA: Diagnosis not present

## 2017-06-20 DIAGNOSIS — L821 Other seborrheic keratosis: Secondary | ICD-10-CM | POA: Diagnosis not present

## 2017-06-20 DIAGNOSIS — L309 Dermatitis, unspecified: Secondary | ICD-10-CM | POA: Diagnosis not present

## 2017-06-20 DIAGNOSIS — L439 Lichen planus, unspecified: Secondary | ICD-10-CM | POA: Diagnosis not present

## 2017-06-20 NOTE — Telephone Encounter (Signed)
I left a detailed message at the pts cell number to call the office.  We need to have information from his insurance card (need BIN, PCN and group#) to request a prior auth for Ventolin.

## 2017-06-21 ENCOUNTER — Telehealth: Payer: Self-pay | Admitting: Family Medicine

## 2017-06-21 MED ORDER — ALBUTEROL SULFATE HFA 108 (90 BASE) MCG/ACT IN AERS: 2.0000 | INHALATION_SPRAY | RESPIRATORY_TRACT | 1 refills | Status: DC | PRN

## 2017-06-21 NOTE — Telephone Encounter (Signed)
Then please change this to [facility], same directions

## 2017-06-21 NOTE — Telephone Encounter (Signed)
Prior auth for Ventolin sent to [email or web address].

## 2017-06-21 NOTE — Telephone Encounter (Signed)
Prior auth for Ventolin sent to [email or web address]

## 2017-06-21 NOTE — Telephone Encounter (Signed)
Called the pt and asked for him to call his insurance to find out what HFA they will cover and to give [place] a call back.

## 2017-06-21 NOTE — Addendum Note (Signed)
Addended by: Agnes Lawrence on: 06/21/2017 02:59 PM   Modules accepted: Orders

## 2017-06-21 NOTE — Telephone Encounter (Signed)
Copied from [place]. Topic: Quick Communication - See Telephone Encounter >> Jun 21, 2017 10:04 AM Vernona Rieger wrote: CRM for notification. See Telephone encounter for:   06/21/17.  Pt said he missed a call from the office yesterday & they stated the needed the group number [email or web address] for the pre autho for ALBUTEROL.  [employer] Medicare RX plan through [facility]. Issuer number [id number]. Member ID [id number]. RXBIN [id number] PCN [id number].

## 2017-06-21 NOTE — Telephone Encounter (Signed)
I called the pt and informed him of the message below and he is aware the Rx for Ventolin was denied and Pro Air was sent to his pharmacy.

## 2017-06-21 NOTE — Telephone Encounter (Signed)
Rx denied-message sent to Dr Sarajane Jews.

## 2017-06-21 NOTE — Telephone Encounter (Signed)
Message from [email or web address] stated the request for Ventolin was denied-case ID: [id number].  Message sent to [place].

## 2017-06-24 NOTE — Telephone Encounter (Signed)
Called pt and Rx did go through for [employer]. This Rx has been shipped to pt's house and insurance did cover.

## 2017-06-29 ENCOUNTER — Ambulatory Visit (HOSPITAL_BASED_OUTPATIENT_CLINIC_OR_DEPARTMENT_OTHER): Payer: Medicare Other | Admitting: Hematology & Oncology

## 2017-06-29 ENCOUNTER — Other Ambulatory Visit: Payer: Self-pay

## 2017-06-29 ENCOUNTER — Other Ambulatory Visit (HOSPITAL_BASED_OUTPATIENT_CLINIC_OR_DEPARTMENT_OTHER): Payer: Medicare Other

## 2017-06-29 ENCOUNTER — Encounter: Payer: Self-pay | Admitting: Hematology & Oncology

## 2017-06-29 VITALS — BP 140/80 | HR 66 | Temp 97.8°F | Resp 18 | Wt 248.0 lb

## 2017-06-29 DIAGNOSIS — Z7982 Long term (current) use of aspirin: Secondary | ICD-10-CM | POA: Diagnosis not present

## 2017-06-29 DIAGNOSIS — I82532 Chronic embolism and thrombosis of left popliteal vein: Secondary | ICD-10-CM

## 2017-06-29 DIAGNOSIS — I82402 Acute embolism and thrombosis of unspecified deep veins of left lower extremity: Secondary | ICD-10-CM

## 2017-06-29 DIAGNOSIS — M7989 Other specified soft tissue disorders: Secondary | ICD-10-CM | POA: Diagnosis not present

## 2017-06-29 DIAGNOSIS — M25561 Pain in right knee: Secondary | ICD-10-CM | POA: Diagnosis not present

## 2017-06-29 DIAGNOSIS — I82511 Chronic embolism and thrombosis of right femoral vein: Secondary | ICD-10-CM | POA: Diagnosis not present

## 2017-06-29 DIAGNOSIS — N289 Disorder of kidney and ureter, unspecified: Secondary | ICD-10-CM

## 2017-06-29 DIAGNOSIS — Z7901 Long term (current) use of anticoagulants: Secondary | ICD-10-CM

## 2017-06-29 DIAGNOSIS — I82403 Acute embolism and thrombosis of unspecified deep veins of lower extremity, bilateral: Secondary | ICD-10-CM

## 2017-06-29 LAB — CBC WITH DIFFERENTIAL (CANCER CENTER ONLY)
BASO#: 0.1 10*3/uL (ref 0.0–0.2)
BASO%: 1.5 % (ref 0.0–2.0)
EOS ABS: 0.2 10*3/uL (ref 0.0–0.5)
EOS%: 3.2 % (ref 0.0–7.0)
HCT: 41.7 % (ref 38.7–49.9)
HEMOGLOBIN: 14.1 g/dL (ref 13.0–17.1)
LYMPH#: 1 10*3/uL (ref 0.9–3.3)
LYMPH%: 18.7 % (ref 14.0–48.0)
MCH: 31.8 pg (ref 28.0–33.4)
MCHC: 33.8 g/dL (ref 32.0–35.9)
MCV: 94 fL (ref 82–98)
MONO#: 0.4 10*3/uL (ref 0.1–0.9)
MONO%: 7.1 % (ref 0.0–13.0)
NEUT%: 69.5 % (ref 40.0–80.0)
NEUTROS ABS: 3.8 10*3/uL (ref 1.5–6.5)
PLATELETS: 216 10*3/uL (ref 145–400)
RBC: 4.43 10*6/uL (ref 4.20–5.70)
RDW: 12.1 % (ref 11.1–15.7)
WBC: 5.4 10*3/uL (ref 4.0–10.0)

## 2017-06-29 LAB — CMP (CANCER CENTER ONLY)
ALBUMIN: 3.8 g/dL (ref 3.3–5.5)
ALT(SGPT): 23 U/L (ref 10–47)
AST: 18 U/L (ref 11–38)
Alkaline Phosphatase: 83 U/L (ref 26–84)
BILIRUBIN TOTAL: 0.6 mg/dL (ref 0.20–1.60)
BUN: 33 mg/dL — AB (ref 7–22)
CHLORIDE: 107 meq/L (ref 98–108)
CO2: 25 mEq/L (ref 18–33)
CREATININE: 2.1 mg/dL — AB (ref 0.6–1.2)
Calcium: 9.5 mg/dL (ref 8.0–10.3)
GLUCOSE: 85 mg/dL (ref 73–118)
Potassium: 4.7 mEq/L (ref 3.3–4.7)
SODIUM: 145 meq/L (ref 128–145)
Total Protein: 6.9 g/dL (ref 6.4–8.1)

## 2017-06-29 NOTE — Progress Notes (Signed)
Hematology and Oncology Follow Up Visit  Joseph Tran [id number] 02/14/1951 66 y.o. 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  Unfortunately, Joseph Tran is still having problems with Joseph Tran kidneys.  Joseph Tran has seen nephrology.  Joseph Tran has seen rheumatology.  Joseph Tran does have an elevated ANA and elevated dsDNA.  The rheumatologist is not sure what these really indicate.  Joseph Tran had a vascular study.  The left kidney really is not working.  It is atrophied.  It sounds like Joseph Tran is going to need a biopsy.  However, the nephrologist/rheumatologist are worried about worsening Joseph Tran kidney function.  Joseph Tran has a swollen warm right knee today.  Joseph Tran says Joseph Tran has had this for couple weeks.  Joseph Tran had an injection into the knee.  After the injection, Joseph Tran began to have swelling.  I spoke to Joseph Tran orthopedist's office.  They will see him today.  Joseph Tran is had no problems with the Xarelto/aspirin.  Joseph Tran has had no bleeding.  Joseph Tran has had no issues with Joseph Tran right leg.  Joseph Tran did have a nice Thanksgiving and Christmas.  Joseph Tran did have a knee surgery in late October.  Joseph Tran had a meniscus tear.  From what Joseph Tran says, the orthopedist feels Joseph Tran will need a total knee replacement in the future.  Because of Joseph Tran knee, Joseph Tran cannot exercise.  Because Joseph Tran cannot exercise, Joseph Tran is getting heavier.  Overall, Joseph Tran performance status is ECOG 1.   Medications:  Allergies as of 06/29/2017      Reactions   Penicillins Hives   Has patient had a PCN reaction causing immediate rash, facial/tongue/throat swelling, SOB or lightheadedness with hypotension: Unknown Has patient had a PCN reaction causing severe rash involving mucus membranes or skin necrosis:Unknown Has patient had a PCN reaction that required hospitalization: No Has patient had a  PCN reaction occurring within the last 10 years: No If all of the above answers are "NO", then may proceed with Cephalosporin use.   Sulfamethoxazole Hives   Sulfonamide Derivatives Hives   Allopurinol Other (See Comments)   PATIENT PREFERENCE Pt refused due to mom developing steven johnson syndrome.   Dilaudid [hydromorphone Hcl] Nausea And Vomiting      Medication List        Accurate as of 06/29/17  4:03 PM. Always use your most recent med list.          albuterol 108 (90 Base) MCG/ACT inhaler Commonly known as:  PROAIR HFA Inhale 2 puffs into the lungs every 4 (four) hours as needed for wheezing or shortness of breath.   aspirin EC 81 MG tablet Take 162 mg by mouth daily.   buPROPion 300 MG 24 hr tablet Commonly known as:  WELLBUTRIN XL TAKE 1 TABLET (300 MG TOTAL) BY MOUTH DAILY.   clonazePAM 1 MG tablet Commonly known as:  KLONOPIN TAKE 1 TABLET BY MOUTH TWICE A DAY AS NEEDED FOR ANXIETY   desvenlafaxine 50 MG 24 hr tablet Commonly known as:  PRISTIQ Take 1 tablet (50 mg total) by mouth daily.   EQ VEGETABLE LAXATIVE 8.6 MG tablet Generic drug:  senna Take 1 tablet by mouth daily.   ferrous sulfate 325 (65 FE) MG tablet Take 325 mg by mouth daily with breakfast.   finasteride 5 MG tablet  Commonly known as:  PROSCAR Take 5 mg by mouth at bedtime.   Fish Oil 1200 MG Caps Take 1,200 mg by mouth 2 (two) times daily.   GAMMA AMINOBUTYRIC ACID PO Take 750 mg by mouth daily. GABA   HYDROcodone-acetaminophen 5-325 MG tablet Commonly known as:  NORCO Take 1-2 tablets by mouth every 6 (six) hours as needed for moderate pain.   hydroxypropyl methylcellulose / hypromellose 2.5 % ophthalmic solution Commonly known as:  ISOPTO TEARS / GONIOVISC Place 1-2 drops into both eyes 3 (three) times daily as needed for dry eyes ((SCHEDULED EACH MORNING)).   Melatonin 3 MG Tbdp Take 3 mg by mouth at bedtime.   multivitamin with minerals Tabs tablet Take 1 tablet by  mouth daily.   psyllium 28 % packet Commonly known as:  METAMUCIL SMOOTH TEXTURE Take 1 packet by mouth at bedtime.   ranitidine 300 MG tablet Commonly known as:  ZANTAC Take 1 tablet (300 mg total) by mouth 2 (two) times daily.   rivaroxaban 10 MG Tabs tablet Commonly known as:  XARELTO Take 10 mg by mouth daily.   STIMULANT LAXATIVE 5 MG EC tablet Generic drug:  bisacodyl Take 5 mg by mouth at bedtime.   tamsulosin 0.4 MG Caps capsule Commonly known as:  FLOMAX Take 0.4 mg by mouth at bedtime.   traMADol 50 MG tablet Commonly known as:  ULTRAM Take 50 mg by mouth.   TYLENOL 8 HOUR ARTHRITIS PAIN 650 MG CR tablet Generic drug:  acetaminophen Take 1,300 mg by mouth at bedtime.       Allergies:  Allergies  Allergen Reactions  . Penicillins Hives    Has patient had a PCN reaction causing immediate rash, facial/tongue/throat swelling, SOB or lightheadedness with hypotension: Unknown Has patient had a PCN reaction causing severe rash involving mucus membranes or skin necrosis:Unknown Has patient had a PCN reaction that required hospitalization: No Has patient had a PCN reaction occurring within the last 10 years: No If all of the above answers are "NO", then may proceed with Cephalosporin use.   . Sulfamethoxazole Hives  . Sulfonamide Derivatives Hives  . Allopurinol Other (See Comments)    PATIENT PREFERENCE Pt refused due to mom developing steven johnson syndrome.  . Dilaudid [Hydromorphone Hcl] Nausea And Vomiting    Past Medical History, Surgical history, Social history, and Family History were reviewed and updated.  Review of Systems: As stated in the interim history   Physical Exam:  weight is 248 lb (112.5 kg). Joseph Tran oral temperature is 97.8 F (36.6 C). Joseph Tran blood pressure is 140/80 and Joseph Tran pulse is 66. Joseph Tran respiration is 18 and oxygen saturation is 97%.   Wt Readings from Last 3 Encounters:  06/29/17 248 lb (112.5 kg)  04/21/17 237 lb 1.6 oz (107.5 kg)    03/31/17 237 lb (107.5 kg)    Physical Exam  Constitutional: Joseph Tran is oriented to person, place, and time.  HENT:  Head: Normocephalic and atraumatic.  Mouth/Throat: Oropharynx is clear and moist.  Eyes: EOM are normal. Pupils are equal, round, and reactive to light.  Neck: Normal range of motion.  Cardiovascular: Normal rate, regular rhythm and normal heart sounds.  Pulmonary/Chest: Effort normal and breath sounds normal.  Abdominal: Soft. Bowel sounds are normal.  Musculoskeletal: Normal range of motion. Joseph Tran exhibits no edema, tenderness or deformity.  Joseph Tran right knee is swollen.  There is some warmth.  Joseph Tran has some fluid.  Joseph Tran has decreased range of motion.  There is some  tenderness to palpation.  Lymphadenopathy:    Joseph Tran has no cervical adenopathy.  Neurological: Joseph Tran is alert and oriented to person, place, and time.  Skin: Skin is warm and dry. No rash noted. No erythema.  Psychiatric: Joseph Tran has a normal mood and affect. Joseph Tran behavior is normal. Judgment and thought content normal.  Vitals reviewed.    Lab Results  Component Value Date   WBC 5.4 06/29/2017   HGB 14.1 06/29/2017   HCT 41.7 06/29/2017   MCV 94 06/29/2017   PLT 216 06/29/2017   Lab Results  Component Value Date   FERRITIN 116 03/31/2017   IRON 101 03/31/2017   TIBC 286 03/31/2017   UIBC 185 03/31/2017   IRONPCTSAT 35 03/31/2017   Lab Results  Component Value Date   RBC 4.43 06/29/2017   No results found for: KPAFRELGTCHN, LAMBDASER, KAPLAMBRATIO No results found for: IGGSERUM, IGA, IGMSERUM No results found for: Odetta Pink, SPEI   Chemistry      Component Value Date/Time   NA 145 06/29/2017 1452   K 4.7 06/29/2017 1452   CL 107 06/29/2017 1452   CO2 25 06/29/2017 1452   BUN 33 (H) 06/29/2017 1452   CREATININE 2.1 (H) 06/29/2017 1452      Component Value Date/Time   CALCIUM 9.5 06/29/2017 1452   ALKPHOS 83 06/29/2017 1452   AST 18 06/29/2017 1452    ALT 23 06/29/2017 1452   BILITOT 0.60 06/29/2017 1452     Impression and Plan: Joseph Tran is a 66 yo Joseph Tran with 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   Joseph Tran has a lot going on right now.  I am most worried about the right knee.  Hopefully, this will be addressed this evening.  Joseph Tran renal function is also a problem.  I am not sure why Joseph Tran has the deteriorating renal function.  By our lab, Joseph Tran renal function is stable.  I think we can get him back in about 3 months.  Hopefully, by then, a lot of issues will be sorted out.  If there is any problems with him being on Xarelto, we can always switch him over to Eliquis.  I spent about 35 minutes with him today.   Volanda Napoleon, MD 12/26/20184:03 PM

## 2017-07-07 ENCOUNTER — Telehealth: Payer: Self-pay | Admitting: Family Medicine

## 2017-07-07 DIAGNOSIS — M25562 Pain in left knee: Secondary | ICD-10-CM | POA: Diagnosis not present

## 2017-07-07 DIAGNOSIS — L03115 Cellulitis of right lower limb: Secondary | ICD-10-CM | POA: Diagnosis not present

## 2017-07-07 NOTE — Telephone Encounter (Signed)
Copied from [place]. Topic: Quick Communication - See Telephone Encounter >> Jul 07, 2017  4:18 PM Ether Griffins B wrote: CRM for notification. See Telephone encounter for:  [facility] clincial appeals calling in regards to med request ventolin. They are wanting to know if he has had any complications with pro air. [phone] ext [phone] case # [id number] [facility] 07/07/17.

## 2017-07-08 NOTE — Telephone Encounter (Signed)
They are basically the same (both contain albuterol) so it doesn't matter to me. Call in whatever the insurance company will cover

## 2017-07-08 NOTE — Telephone Encounter (Signed)
Dr. Sarajane Jews do you recall him having any issues with this medication if now I will contact the pt and speak with them about this matter

## 2017-07-11 ENCOUNTER — Telehealth: Payer: Self-pay | Admitting: Family Medicine

## 2017-07-11 NOTE — Telephone Encounter (Signed)
Called and left another VM with pt's name DOB, ref # and advise that NO pt has NOT had any issues with proair.

## 2017-07-11 NOTE — Telephone Encounter (Signed)
Copied from [employer] [id number]. Topic: Inquiry >> Jul 11, 2017  2:43 PM Cecelia Byars, NT wrote: Reason for CRM: [employer] called and wants to know if he has a intolerance to pro air , please advise [phone]

## 2017-07-11 NOTE — Telephone Encounter (Signed)
This is a duplicated message.

## 2017-07-11 NOTE — Telephone Encounter (Signed)
Called this number and phone line just keeps ringing.

## 2017-07-11 NOTE — Telephone Encounter (Signed)
Called and left a detailed VM that Dr. Sarajane Jews was fine with them filling whatever the insurance is willing to cover. Left pt's name, DOB, and our call back number.

## 2017-07-14 DIAGNOSIS — R809 Proteinuria, unspecified: Secondary | ICD-10-CM | POA: Diagnosis not present

## 2017-07-14 DIAGNOSIS — N189 Chronic kidney disease, unspecified: Secondary | ICD-10-CM | POA: Diagnosis not present

## 2017-07-14 DIAGNOSIS — N250 Renal osteodystrophy: Secondary | ICD-10-CM | POA: Diagnosis not present

## 2017-07-14 DIAGNOSIS — D649 Anemia, unspecified: Secondary | ICD-10-CM | POA: Diagnosis not present

## 2017-07-14 DIAGNOSIS — M109 Gout, unspecified: Secondary | ICD-10-CM | POA: Diagnosis not present

## 2017-07-14 DIAGNOSIS — N183 Chronic kidney disease, stage 3 (moderate): Secondary | ICD-10-CM | POA: Diagnosis not present

## 2017-07-19 ENCOUNTER — Telehealth: Payer: Self-pay

## 2017-07-19 NOTE — Telephone Encounter (Signed)
Called pt to advise that the PA did go through for the ventolin [facility]

## 2017-07-21 DIAGNOSIS — M2550 Pain in unspecified joint: Secondary | ICD-10-CM | POA: Diagnosis not present

## 2017-08-04 DIAGNOSIS — M25561 Pain in right knee: Secondary | ICD-10-CM | POA: Diagnosis not present

## 2017-08-06 ENCOUNTER — Other Ambulatory Visit: Payer: Self-pay | Admitting: Family Medicine

## 2017-09-06 DIAGNOSIS — M25561 Pain in right knee: Secondary | ICD-10-CM | POA: Diagnosis not present

## 2017-09-06 DIAGNOSIS — M79674 Pain in right toe(s): Secondary | ICD-10-CM | POA: Diagnosis not present

## 2017-09-06 DIAGNOSIS — M1711 Unilateral primary osteoarthritis, right knee: Secondary | ICD-10-CM | POA: Diagnosis not present

## 2017-09-13 ENCOUNTER — Encounter: Payer: Self-pay | Admitting: Family Medicine

## 2017-09-13 ENCOUNTER — Ambulatory Visit (INDEPENDENT_AMBULATORY_CARE_PROVIDER_SITE_OTHER): Payer: Medicare Other | Admitting: Family Medicine

## 2017-09-13 VITALS — BP 118/70 | HR 74 | Temp 98.2°F | Wt 251.2 lb

## 2017-09-13 DIAGNOSIS — I82401 Acute embolism and thrombosis of unspecified deep veins of right lower extremity: Secondary | ICD-10-CM

## 2017-09-13 DIAGNOSIS — M329 Systemic lupus erythematosus, unspecified: Secondary | ICD-10-CM | POA: Diagnosis not present

## 2017-09-13 DIAGNOSIS — N261 Atrophy of kidney (terminal): Secondary | ICD-10-CM | POA: Diagnosis not present

## 2017-09-13 DIAGNOSIS — M1711 Unilateral primary osteoarthritis, right knee: Secondary | ICD-10-CM

## 2017-09-13 DIAGNOSIS — N183 Chronic kidney disease, stage 3 unspecified: Secondary | ICD-10-CM | POA: Insufficient documentation

## 2017-09-13 DIAGNOSIS — E291 Testicular hypofunction: Secondary | ICD-10-CM | POA: Diagnosis not present

## 2017-09-13 DIAGNOSIS — E785 Hyperlipidemia, unspecified: Secondary | ICD-10-CM | POA: Diagnosis not present

## 2017-09-13 DIAGNOSIS — Z860100 Personal history of colon polyps, unspecified: Secondary | ICD-10-CM

## 2017-09-13 DIAGNOSIS — F418 Other specified anxiety disorders: Secondary | ICD-10-CM | POA: Diagnosis not present

## 2017-09-13 NOTE — Progress Notes (Signed)
   Subjective:    Patient ID: Joseph Tran, male    DOB: 31-May-1951, 67 y.o.   MRN: [id number]  HPI Here to follow multiple issues. He feels well in general except for right knee pain. He sees Dr. Dorna Leitz and he wears a brace most days. He has had steroid injections with no relief. They are considering trying Synvisc injections next. Over the past year he was found to have stage 3 CKD and he is followed by Dr. [name] at [facility]. The left kidney is atrophic. The right kidney is full of stones and only functions at 50%. He is also followed by Dr. Heather Roberts of [facility] for this. He has been told to avoid all NSAIDs and he is on a protein restricted diet. His last creatinine was 2.1. He was diagnosed with lupus with a positive ANA and dsDNA, and he is followed by Dr. Lorenza Cambridge at [facility]. No specific treatment has been started for this as yet. 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    Review of Systems  Constitutional: Negative.   HENT: Negative.   Eyes: Negative.   Respiratory: Negative.   Cardiovascular: Negative.   Gastrointestinal: Negative.   Genitourinary: Negative.   Musculoskeletal: Positive for arthralgias.  Skin: Negative.   Neurological: Negative.   Psychiatric/Behavioral: Negative.        Objective:   Physical Exam  Constitutional: He is oriented to person, place, and time. He appears well-developed and well-nourished. No distress.  HENT:  Head: Normocephalic and atraumatic.  Right Ear: External ear normal.  Left Ear: External ear normal.  Nose: Nose normal.  Mouth/Throat: Oropharynx is clear and moist. No oropharyngeal exudate.  Eyes: Conjunctivae and EOM are normal. Pupils are equal, round, and reactive to light. Right eye exhibits no discharge. Left eye exhibits no discharge. No scleral icterus.  Neck: Neck supple. No JVD present. No tracheal deviation present. No thyromegaly  present.  Cardiovascular: Normal rate, regular rhythm, normal heart sounds and intact distal pulses. Exam reveals no gallop and no friction rub.  No murmur heard. Pulmonary/Chest: Effort normal and breath sounds normal. No respiratory distress. He has no wheezes. He has no rales. He exhibits no tenderness.  Abdominal: Soft. Bowel sounds are normal. He exhibits no distension and no mass. There is no tenderness. There is no rebound and no guarding.  Musculoskeletal: Normal range of motion. He exhibits no edema or tenderness.  Lymphadenopathy:    He has no cervical adenopathy.  Neurological: He is alert and oriented to person, place, and time. He has normal reflexes. No cranial nerve deficit. He exhibits normal muscle tone. Coordination normal.  Skin: Skin is warm and dry. No rash noted. He is not diaphoretic. No erythema. No pallor.  Psychiatric: He has a normal mood and affect. His behavior is normal. Judgment and thought content normal.          Assessment & Plan:  He has renal disease which seems stable for now. He has right knee pain and is followed by Orthopedics. He has newly diagnosed SLE. We will set up fasting labs soon. He is due for another colonoscopy.  [name], MD

## 2017-09-14 ENCOUNTER — Encounter: Payer: Self-pay | Admitting: Family Medicine

## 2017-09-14 DIAGNOSIS — R972 Elevated prostate specific antigen [PSA]: Secondary | ICD-10-CM | POA: Diagnosis not present

## 2017-09-19 ENCOUNTER — Telehealth: Payer: Self-pay | Admitting: Family Medicine

## 2017-09-19 NOTE — Telephone Encounter (Signed)
Called pt and asked for a call back last colonoscopy was 10 years ago

## 2017-09-19 NOTE — Telephone Encounter (Signed)
Copied from [place]. Topic: Quick Communication - See Telephone Encounter >> Sep 19, 2017  4:04 PM Vernona Rieger wrote: CRM for notification. See Telephone encounter for:   09/19/17.  Patient said that [place] office is requesting his results from his last colonoscopy. Please fax to [phone]

## 2017-09-20 ENCOUNTER — Other Ambulatory Visit (INDEPENDENT_AMBULATORY_CARE_PROVIDER_SITE_OTHER): Payer: Medicare Other

## 2017-09-20 DIAGNOSIS — N183 Chronic kidney disease, stage 3 unspecified: Secondary | ICD-10-CM

## 2017-09-20 DIAGNOSIS — E785 Hyperlipidemia, unspecified: Secondary | ICD-10-CM | POA: Diagnosis not present

## 2017-09-20 LAB — LIPID PANEL
CHOL/HDL RATIO: 5
Cholesterol: 211 mg/dL — ABNORMAL HIGH (ref 0–200)
HDL: 40.5 mg/dL (ref >39.00)
LDL CALC: 141 mg/dL — AB (ref 0–99)
NonHDL: 170.88
Triglycerides: 150 mg/dL — ABNORMAL HIGH (ref 0.0–149.0)
VLDL: 30 mg/dL (ref 0.0–40.0)

## 2017-09-20 LAB — POC URINALSYSI DIPSTICK (AUTOMATED)
BILIRUBIN UA: NEGATIVE
Blood, UA: NEGATIVE
Glucose, UA: NEGATIVE
KETONES UA: NEGATIVE
Leukocytes, UA: NEGATIVE
Nitrite, UA: NEGATIVE
PH UA: 5.5 (ref 5.0–8.0)
PROTEIN UA: NEGATIVE
Urobilinogen, UA: 0.2 E.U./dL

## 2017-09-20 LAB — CBC WITH DIFFERENTIAL/PLATELET
Basophils Absolute: 0.1 10*3/uL (ref 0.0–0.1)
Basophils Relative: 1.2 % (ref 0.0–3.0)
EOS PCT: 4.8 % (ref 0.0–5.0)
Eosinophils Absolute: 0.2 10*3/uL (ref 0.0–0.7)
HCT: 39.7 % (ref 39.0–52.0)
Hemoglobin: 13.7 g/dL (ref 13.0–17.0)
LYMPHS ABS: 1.2 10*3/uL (ref 0.7–4.0)
Lymphocytes Relative: 26 % (ref 12.0–46.0)
MCHC: 34.4 g/dL (ref 30.0–36.0)
MCV: 93 fl (ref 78.0–100.0)
MONO ABS: 0.3 10*3/uL (ref 0.1–1.0)
Monocytes Relative: 7.5 % (ref 3.0–12.0)
NEUTROS ABS: 2.8 10*3/uL (ref 1.4–7.7)
Neutrophils Relative %: 60.5 % (ref 43.0–77.0)
PLATELETS: 206 10*3/uL (ref 150.0–400.0)
RBC: 4.27 Mil/uL (ref 4.22–5.81)
RDW: 13.3 % (ref 11.5–15.5)
WBC: 4.6 10*3/uL (ref 4.0–10.5)

## 2017-09-20 LAB — TSH: TSH: 1.93 u[IU]/mL (ref 0.35–4.50)

## 2017-09-20 LAB — HEPATIC FUNCTION PANEL
ALK PHOS: 72 U/L (ref 39–117)
ALT: 16 U/L (ref 0–53)
AST: 19 U/L (ref 0–37)
Albumin: 4 g/dL (ref 3.5–5.2)
BILIRUBIN TOTAL: 0.7 mg/dL (ref 0.2–1.2)
Bilirubin, Direct: 0.1 mg/dL (ref 0.0–0.3)
Total Protein: 6.1 g/dL (ref 6.0–8.3)

## 2017-09-20 LAB — BASIC METABOLIC PANEL
BUN: 15 mg/dL (ref 6–23)
CALCIUM: 9.5 mg/dL (ref 8.4–10.5)
CO2: 24 mEq/L (ref 19–32)
CREATININE: 1.86 mg/dL — AB (ref 0.40–1.50)
Chloride: 112 mEq/L (ref 96–112)
GFR: 38.8 mL/min — AB (ref >60.00)
GLUCOSE: 94 mg/dL (ref 70–99)
POTASSIUM: 4.2 meq/L (ref 3.5–5.1)
Sodium: 143 mEq/L (ref 135–145)

## 2017-09-20 NOTE — Telephone Encounter (Signed)
We do NOT have this information on file pt had his colonoscopy done in [place] in 2011 called his doctor at that time Dr. Darlen Round and asked for them to fax [place] the paper work. Wait on fax.

## 2017-09-21 DIAGNOSIS — N529 Male erectile dysfunction, unspecified: Secondary | ICD-10-CM | POA: Diagnosis not present

## 2017-09-21 DIAGNOSIS — N401 Enlarged prostate with lower urinary tract symptoms: Secondary | ICD-10-CM | POA: Diagnosis not present

## 2017-09-21 DIAGNOSIS — N138 Other obstructive and reflux uropathy: Secondary | ICD-10-CM | POA: Diagnosis not present

## 2017-09-21 DIAGNOSIS — R972 Elevated prostate specific antigen [PSA]: Secondary | ICD-10-CM | POA: Diagnosis not present

## 2017-09-21 NOTE — Telephone Encounter (Signed)
Called pt to advise him that I did reach out to Dr. Darlen Round office to fax [place] pt colonoscopy report. I have  NOT yet recived anything. Called pt to advise him to reach out to Dr. Darlen Round office to have them fax it to his GI doctor as well. Left him there phone number which is [phone]

## 2017-09-22 NOTE — Telephone Encounter (Signed)
We did get the fax from Dr. Claudette Stapler office for pt's colonoscopy report. We have faxed the pt's report the [facility] office. Fax number [phone]. Called pt and advised that we did get the report and faxed a copy to the GI today and have placed a copy to be scanned into pt's chart.

## 2017-09-28 ENCOUNTER — Inpatient Hospital Stay: Payer: Medicare Other | Attending: Hematology & Oncology

## 2017-09-28 ENCOUNTER — Inpatient Hospital Stay (HOSPITAL_BASED_OUTPATIENT_CLINIC_OR_DEPARTMENT_OTHER): Payer: Medicare Other | Admitting: Hematology & Oncology

## 2017-09-28 ENCOUNTER — Other Ambulatory Visit: Payer: Self-pay

## 2017-09-28 ENCOUNTER — Ambulatory Visit (HOSPITAL_BASED_OUTPATIENT_CLINIC_OR_DEPARTMENT_OTHER)
Admission: RE | Admit: 2017-09-28 | Discharge: 2017-09-28 | Disposition: A | Discharge status: Home / Self Care | Payer: Medicare Other | Source: Ambulatory Visit | Attending: Hematology & Oncology | Admitting: Hematology & Oncology

## 2017-09-28 VITALS — BP 143/85 | HR 67 | Temp 97.9°F | Resp 18 | Wt 252.2 lb

## 2017-09-28 DIAGNOSIS — I82532 Chronic embolism and thrombosis of left popliteal vein: Secondary | ICD-10-CM | POA: Diagnosis not present

## 2017-09-28 DIAGNOSIS — Z7901 Long term (current) use of anticoagulants: Secondary | ICD-10-CM | POA: Insufficient documentation

## 2017-09-28 DIAGNOSIS — I82511 Chronic embolism and thrombosis of right femoral vein: Secondary | ICD-10-CM | POA: Diagnosis not present

## 2017-09-28 DIAGNOSIS — Z7982 Long term (current) use of aspirin: Secondary | ICD-10-CM | POA: Insufficient documentation

## 2017-09-28 DIAGNOSIS — I82401 Acute embolism and thrombosis of unspecified deep veins of right lower extremity: Secondary | ICD-10-CM

## 2017-09-28 DIAGNOSIS — I10 Essential (primary) hypertension: Secondary | ICD-10-CM

## 2017-09-28 DIAGNOSIS — I82402 Acute embolism and thrombosis of unspecified deep veins of left lower extremity: Secondary | ICD-10-CM

## 2017-09-28 DIAGNOSIS — N289 Disorder of kidney and ureter, unspecified: Secondary | ICD-10-CM | POA: Diagnosis not present

## 2017-09-28 DIAGNOSIS — R600 Localized edema: Secondary | ICD-10-CM | POA: Diagnosis not present

## 2017-09-28 LAB — CMP (CANCER CENTER ONLY)
ALBUMIN: 3.9 g/dL (ref 3.5–5.0)
ALK PHOS: 79 U/L (ref 26–84)
ALT: 26 U/L (ref 10–47)
AST: 28 U/L (ref 11–38)
Anion gap: 7 (ref 5–15)
BUN: 16 mg/dL (ref 7–22)
CALCIUM: 9.6 mg/dL (ref 8.0–10.3)
CO2: 28 mmol/L (ref 18–33)
CREATININE: 1.6 mg/dL — AB (ref 0.60–1.20)
Chloride: 111 mmol/L — ABNORMAL HIGH (ref 98–108)
Glucose, Bld: 94 mg/dL (ref 73–118)
Potassium: 4.4 mmol/L (ref 3.3–4.7)
SODIUM: 146 mmol/L — AB (ref 128–145)
TOTAL PROTEIN: 7 g/dL (ref 6.4–8.1)
Total Bilirubin: 0.6 mg/dL (ref 0.2–1.6)

## 2017-09-28 LAB — CBC WITH DIFFERENTIAL (CANCER CENTER ONLY)
BASOS ABS: 0 10*3/uL (ref 0.0–0.1)
Basophils Relative: 1 %
EOS PCT: 3 %
Eosinophils Absolute: 0.2 10*3/uL (ref 0.0–0.5)
HEMATOCRIT: 42.3 % (ref 38.7–49.9)
Hemoglobin: 14.2 g/dL (ref 13.0–17.1)
LYMPHS PCT: 21 %
Lymphs Abs: 1.2 10*3/uL (ref 0.9–3.3)
MCH: 31.9 pg (ref 28.0–33.4)
MCHC: 33.6 g/dL (ref 32.0–35.9)
MCV: 95.1 fL (ref 82.0–98.0)
Monocytes Absolute: 0.5 10*3/uL (ref 0.1–0.9)
Monocytes Relative: 9 %
NEUTROS ABS: 3.7 10*3/uL (ref 1.5–6.5)
Neutrophils Relative %: 66 %
PLATELETS: 199 10*3/uL (ref 145–400)
RBC: 4.45 MIL/uL (ref 4.20–5.70)
RDW: 12.5 % (ref 11.1–15.7)
WBC Count: 5.7 10*3/uL (ref 4.0–10.0)

## 2017-09-28 NOTE — Progress Notes (Signed)
Hematology and Oncology Follow Up Visit  Joseph Tran [id number] 10-26-1950 67 y.o. 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 is is here today for a follow-up.  The problem now is that he has swelling in both legs.  He had the blood clot in the  right leg.  He is on low-dose Xarelto and aspirin.  His renal function seems to be getting better.  Today, his creatinine is 1.6.  We did go ahead and get a Doppler of his left leg.  There is no thrombus.  As such, I think that he probably needs to be on a diuretic.  We will start him off on a low-dose diuretic with Lasix.  I told him that he really needs to follow-up with his family doctor regarding this.  He has had no cough or shortness of breath.  Is been no chest wall pain.  He is had no change in bowel or bladder habits.  He has had no fever.  He has had no bleeding.   Overall, his performance status is ECOG 1.   Medications:  Allergies as of 09/28/2017      Reactions   Penicillins Hives   Has patient had a PCN reaction causing immediate rash, facial/tongue/throat swelling, SOB or lightheadedness with hypotension: Unknown Has patient had a PCN reaction causing severe rash involving mucus membranes or skin necrosis:Unknown Has patient had a PCN reaction that required hospitalization: No Has patient had a PCN reaction occurring within the last 10 years: No If all of the above answers are "NO", then may proceed with Cephalosporin use.   Sulfamethoxazole Hives   Sulfonamide Derivatives Hives   Allopurinol Other (See Comments)   PATIENT PREFERENCE Pt refused due to mom developing steven johnson syndrome.   Dilaudid [hydromorphone Hcl] Nausea And Vomiting      Medication List        Accurate as of 09/28/17  3:50 PM. Always use your  most recent med list.          albuterol 108 (90 Base) MCG/ACT inhaler Commonly known as:  PROAIR HFA Inhale 2 puffs into the lungs every 4 (four) hours as needed for wheezing or shortness of breath.   aspirin EC 81 MG tablet Take 162 mg by mouth daily.   buPROPion 300 MG 24 hr tablet Commonly known as:  WELLBUTRIN XL TAKE 1 TABLET BY MOUTH  DAILY   clonazePAM 1 MG tablet Commonly known as:  KLONOPIN TAKE 1 TABLET BY MOUTH TWICE A DAY AS NEEDED FOR ANXIETY   desvenlafaxine 50 MG 24 hr tablet Commonly known as:  PRISTIQ Take 1 tablet (50 mg total) by mouth daily.   EQ VEGETABLE LAXATIVE 8.6 MG tablet Generic drug:  senna Take 1 tablet by mouth daily.   finasteride 5 MG tablet Commonly known as:  PROSCAR Take 5 mg by mouth at bedtime.   Fish Oil 1200 MG Caps Take 1,200 mg by mouth 2 (two) times daily.   GAMMA AMINOBUTYRIC ACID PO Take 750 mg by mouth daily. GABA   GLUCOSAMINE CHONDR 1500 COMPLX PO Take by mouth 2 (two) times daily.   hydroxypropyl methylcellulose / hypromellose 2.5 % ophthalmic solution Commonly known as:  ISOPTO TEARS / GONIOVISC Place 1-2 drops into both eyes 3 (three) times daily as needed for  dry eyes ((SCHEDULED EACH MORNING)).   Melatonin 3 MG Tbdp Take 3 mg by mouth at bedtime.   multivitamin with minerals Tabs tablet Take 1 tablet by mouth daily.   psyllium 28 % packet Commonly known as:  METAMUCIL SMOOTH TEXTURE Take 1 packet by mouth at bedtime.   rivaroxaban 10 MG Tabs tablet Commonly known as:  XARELTO Take 10 mg by mouth daily.   STIMULANT LAXATIVE 5 MG EC tablet Generic drug:  bisacodyl Take 5 mg by mouth at bedtime.   tamsulosin 0.4 MG Caps capsule Commonly known as:  FLOMAX Take 0.4 mg by mouth at bedtime.   traMADol 50 MG tablet Commonly known as:  ULTRAM Take 50 mg by mouth.   TYLENOL 8 HOUR ARTHRITIS PAIN 650 MG CR tablet Generic drug:  acetaminophen Take 1,300 mg by mouth 3 (three) times daily.        Allergies:  Allergies  Allergen Reactions  . Penicillins Hives    Has patient had a PCN reaction causing immediate rash, facial/tongue/throat swelling, SOB or lightheadedness with hypotension: Unknown Has patient had a PCN reaction causing severe rash involving mucus membranes or skin necrosis:Unknown Has patient had a PCN reaction that required hospitalization: No Has patient had a PCN reaction occurring within the last 10 years: No If all of the above answers are "NO", then may proceed with Cephalosporin use.   . Sulfamethoxazole Hives  . Sulfonamide Derivatives Hives  . Allopurinol Other (See Comments)    PATIENT PREFERENCE Pt refused due to mom developing steven johnson syndrome.  . Dilaudid [Hydromorphone Hcl] Nausea And Vomiting    Past Medical History, Surgical history, Social history, and Family History were reviewed and updated.  Review of Systems: Review of Systems  Constitutional: Negative.   HENT: Negative.   Eyes: Negative.   Respiratory: Negative.   Cardiovascular: Positive for leg swelling.  Gastrointestinal: Negative.   Genitourinary: Negative.   Musculoskeletal: Negative.   Skin: Negative.   Neurological: Negative.   Endo/Heme/Allergies: Negative.   Psychiatric/Behavioral: Negative.      Physical Exam:  weight is 252 lb 4 oz (114.4 kg). His oral temperature is 97.9 F (36.6 C). His blood pressure is 143/85 (abnormal) and his pulse is 67. His respiration is 18 and oxygen saturation is 98%.   Wt Readings from Last 3 Encounters:  09/28/17 252 lb 4 oz (114.4 kg)  09/13/17 251 lb 3.2 oz (113.9 kg)  06/29/17 248 lb (112.5 kg)    Physical Exam  Constitutional: He is oriented to person, place, and time.  HENT:  Head: Normocephalic and atraumatic.  Mouth/Throat: Oropharynx is clear and moist.  Eyes: Pupils are equal, round, and reactive to light. EOM are normal.  Neck: Normal range of motion.  Cardiovascular: Normal rate, regular rhythm and normal  heart sounds.  Pulmonary/Chest: Effort normal and breath sounds normal.  Abdominal: Soft. Bowel sounds are normal.  Musculoskeletal: Normal range of motion. He exhibits no edema, tenderness or deformity.  His right knee is swollen.  There is some warmth.  He has some fluid.  He has decreased range of motion.  There is some tenderness to palpation.  Lymphadenopathy:    He has no cervical adenopathy.  Neurological: He is alert and oriented to person, place, and time.  Skin: Skin is warm and dry. No rash noted. No erythema.  Psychiatric: He has a normal mood and affect. His behavior is normal. Judgment and thought content normal.  Vitals reviewed.    Lab Results  Component  Value Date   WBC 5.7 09/28/2017   HGB 13.7 09/20/2017   HCT 42.3 09/28/2017   MCV 95.1 09/28/2017   PLT 199 09/28/2017   Lab Results  Component Value Date   FERRITIN 116 03/31/2017   IRON 101 03/31/2017   TIBC 286 03/31/2017   UIBC 185 03/31/2017   IRONPCTSAT 35 03/31/2017   Lab Results  Component Value Date   RBC 4.45 09/28/2017   No results found for: KPAFRELGTCHN, LAMBDASER, KAPLAMBRATIO No results found for: IGGSERUM, IGA, IGMSERUM No results found for: Kathrynn Ducking, MSPIKE, SPEI   Chemistry      Component Value Date/Time   NA 146 (H) 09/28/2017 1451   NA 145 06/29/2017 1452   K 4.4 09/28/2017 1451   K 4.7 06/29/2017 1452   CL 111 (H) 09/28/2017 1451   CL 107 06/29/2017 1452   CO2 28 09/28/2017 1451   CO2 25 06/29/2017 1452   BUN 16 09/28/2017 1451   BUN 33 (H) 06/29/2017 1452   CREATININE 1.60 (H) 09/28/2017 1451   CREATININE 2.1 (H) 06/29/2017 1452      Component Value Date/Time   CALCIUM 9.6 09/28/2017 1451   CALCIUM 9.5 06/29/2017 1452   ALKPHOS 79 09/28/2017 1451   ALKPHOS 83 06/29/2017 1452   AST 28 09/28/2017 1451   ALT 26 09/28/2017 1451   ALT 23 06/29/2017 1452   BILITOT 0.6 09/28/2017 1451     Impression and Plan: Mr. 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   I am glad that his renal function is improving.  I will keep him on the Xarelto for right now.  Again, there is no thrombus in the left leg.  I did tell him to try a compression stocking for his left leg.  Hopefully, his family doctor will be able to help out with the leg swelling.  I would like to see him back in another 6 weeks.  He says that he is going to have injections into his right knee.  I do not see any problems with him having this.  If he needs to stop the Xarelto a day or so before each injection, this would be okay.  Volanda Napoleon, MD 3/27/20193:50 PM

## 2017-09-29 ENCOUNTER — Telehealth: Payer: Self-pay | Admitting: *Deleted

## 2017-09-29 MED ORDER — FUROSEMIDE 20 MG PO TABS: 20.0000 mg | ORAL_TABLET | Freq: Every day | ORAL | 3 refills | Status: DC

## 2017-09-29 NOTE — Telephone Encounter (Addendum)
-----   Message from Volanda Napoleon, MD sent at 09/28/2017  6:17 PM EDT ----- Call - NO blood clot in the left leg!!!  Left message on patient personal voice mail.

## 2017-10-20 DIAGNOSIS — M1711 Unilateral primary osteoarthritis, right knee: Secondary | ICD-10-CM | POA: Diagnosis not present

## 2017-10-25 ENCOUNTER — Encounter: Payer: Self-pay | Admitting: Family Medicine

## 2017-10-27 DIAGNOSIS — M1711 Unilateral primary osteoarthritis, right knee: Secondary | ICD-10-CM | POA: Diagnosis not present

## 2017-10-28 ENCOUNTER — Telehealth: Payer: Self-pay | Admitting: Internal Medicine

## 2017-10-28 NOTE — Telephone Encounter (Signed)
We have received a referral from pt's PCP on 09/13/17 pm requesting pt being seen for a repeat colon. Records have been received and placed on 09/13/17 pm DOD Dr. Vena Rua desk for review. Pt wants to know if he really needs a repeat colon.

## 2017-11-02 NOTE — Telephone Encounter (Signed)
Records reviewed by Dr. Hilarie Fredrickson.  Dr. Hilarie Fredrickson stated "normal colon, complete exam, good prep.  If no family hx of colon cancer. Repeat in 10 years, May 2021. Recall placed in EPIC

## 2017-11-03 DIAGNOSIS — M1711 Unilateral primary osteoarthritis, right knee: Secondary | ICD-10-CM | POA: Diagnosis not present

## 2017-11-16 DIAGNOSIS — I10 Essential (primary) hypertension: Secondary | ICD-10-CM | POA: Diagnosis not present

## 2017-11-16 DIAGNOSIS — M109 Gout, unspecified: Secondary | ICD-10-CM | POA: Diagnosis not present

## 2017-11-16 DIAGNOSIS — N183 Chronic kidney disease, stage 3 (moderate): Secondary | ICD-10-CM | POA: Diagnosis not present

## 2017-11-16 DIAGNOSIS — N189 Chronic kidney disease, unspecified: Secondary | ICD-10-CM | POA: Diagnosis not present

## 2017-11-16 DIAGNOSIS — D649 Anemia, unspecified: Secondary | ICD-10-CM | POA: Diagnosis not present

## 2017-11-16 DIAGNOSIS — R809 Proteinuria, unspecified: Secondary | ICD-10-CM | POA: Diagnosis not present

## 2017-12-01 DIAGNOSIS — G5762 Lesion of plantar nerve, left lower limb: Secondary | ICD-10-CM | POA: Diagnosis not present

## 2018-01-08 ENCOUNTER — Other Ambulatory Visit: Payer: Self-pay | Admitting: Family Medicine

## 2018-01-09 NOTE — Telephone Encounter (Signed)
Last OV 09/13/2017   Last refilled 10/18/2016 disp 180 with 1 refill   Pt has picked up his last Rx and has no refills on file. However, he should have enough to last until September.   Around September he will be out.  Sent to PCP for approval

## 2018-01-10 ENCOUNTER — Other Ambulatory Visit: Payer: Self-pay | Admitting: Family Medicine

## 2018-01-10 NOTE — Telephone Encounter (Unsigned)
Copied from [place] [phone] [phone]. Topic: Quick Communication - Rx Refill/Question >> Jan 10, 2018  2:40 PM Neva Seat wrote: clonazePAM Bobbye [place]) 1 MG tablet  Pt is needing refills  [place], [address] [place] [address] [place] [address] Phone: [phone] Fax: [phone]

## 2018-01-10 NOTE — Telephone Encounter (Signed)
No refills until 04-19-18

## 2018-01-11 NOTE — Telephone Encounter (Signed)
He already has a refill available to last until October

## 2018-01-11 NOTE — Telephone Encounter (Signed)
Refill of Klonopin  LRF 10/18/16  #180  0 refills  LOV 09/13/17 Dr. Sarajane Jews  [facility] - [place], [address] [place] [address] Phone: [phone] Fax: [phone]

## 2018-01-17 ENCOUNTER — Telehealth: Payer: Self-pay

## 2018-01-17 MED ORDER — CLONAZEPAM 1 MG PO TABS: ORAL_TABLET | ORAL | 1 refills | Status: DC

## 2018-01-17 NOTE — Telephone Encounter (Signed)
Copied from [place] [id number]. Topic: Quick Communication - Rx Refill/Question >> Jan 10, 2018  2:40 PM Neva Seat wrote: clonazePAM Bobbye [place]) 1 MG tablet  Pt is needing refills  [place], [facility] [address] [place] [address] Phone: [phone] Fax: [phone]   >> Jan 17, 2018  3:05 PM Percell Belt A wrote: Pt called in and stated that [facility] he has no more refills on this med.  They are telling him that he needs a new script sent over to them?  He stated last time it was refilled it was with no refills  Please advise

## 2018-01-17 NOTE — Telephone Encounter (Signed)
rx was sent to [facility]

## 2018-01-17 NOTE — Telephone Encounter (Signed)
Last OV 09/13/2017   Last refilled 10/18/2016 disp 180 with 1 refill   Pt should be in need of refills at this time call pharmacy

## 2018-01-18 NOTE — Telephone Encounter (Signed)
Called and spoke with pt. Pt advised and voiced understanding.  

## 2018-01-31 DIAGNOSIS — L814 Other melanin hyperpigmentation: Secondary | ICD-10-CM | POA: Diagnosis not present

## 2018-01-31 DIAGNOSIS — L821 Other seborrheic keratosis: Secondary | ICD-10-CM | POA: Diagnosis not present

## 2018-01-31 DIAGNOSIS — D1801 Hemangioma of skin and subcutaneous tissue: Secondary | ICD-10-CM | POA: Diagnosis not present

## 2018-01-31 DIAGNOSIS — L308 Other specified dermatitis: Secondary | ICD-10-CM | POA: Diagnosis not present

## 2018-01-31 DIAGNOSIS — Z85828 Personal history of other malignant neoplasm of skin: Secondary | ICD-10-CM | POA: Diagnosis not present

## 2018-01-31 DIAGNOSIS — D225 Melanocytic nevi of trunk: Secondary | ICD-10-CM | POA: Diagnosis not present

## 2018-03-22 DIAGNOSIS — N189 Chronic kidney disease, unspecified: Secondary | ICD-10-CM | POA: Diagnosis not present

## 2018-03-22 DIAGNOSIS — E7800 Pure hypercholesterolemia, unspecified: Secondary | ICD-10-CM | POA: Diagnosis not present

## 2018-03-22 DIAGNOSIS — M109 Gout, unspecified: Secondary | ICD-10-CM | POA: Diagnosis not present

## 2018-03-22 DIAGNOSIS — D649 Anemia, unspecified: Secondary | ICD-10-CM | POA: Diagnosis not present

## 2018-03-22 DIAGNOSIS — N183 Chronic kidney disease, stage 3 (moderate): Secondary | ICD-10-CM | POA: Diagnosis not present

## 2018-03-22 DIAGNOSIS — I10 Essential (primary) hypertension: Secondary | ICD-10-CM | POA: Diagnosis not present

## 2018-04-11 ENCOUNTER — Other Ambulatory Visit: Payer: Self-pay | Admitting: Family Medicine

## 2018-04-11 ENCOUNTER — Other Ambulatory Visit: Payer: Self-pay | Admitting: Hematology & Oncology

## 2018-04-11 ENCOUNTER — Ambulatory Visit (INDEPENDENT_AMBULATORY_CARE_PROVIDER_SITE_OTHER): Payer: Medicare Other

## 2018-04-11 DIAGNOSIS — Z23 Encounter for immunization: Secondary | ICD-10-CM | POA: Diagnosis not present

## 2018-05-25 DIAGNOSIS — M25561 Pain in right knee: Secondary | ICD-10-CM | POA: Diagnosis not present

## 2018-05-25 DIAGNOSIS — I8311 Varicose veins of right lower extremity with inflammation: Secondary | ICD-10-CM | POA: Diagnosis not present

## 2018-05-27 ENCOUNTER — Other Ambulatory Visit: Payer: Self-pay | Admitting: Family Medicine

## 2018-06-06 NOTE — Telephone Encounter (Signed)
Message routed to PCP CMA for refills. 

## 2018-06-06 NOTE — Telephone Encounter (Signed)
Pt called and stated he would like a normal refill sent to  [place], [place] [phone] (Phone) [phone] (Fax)   Pt states that he is completely out and would like to know if we could send enough medication in until mail order comes in. Please advise   [id number] - [place], [place] - [address] AT [place] [phone] (Phone) [phone] (Fax)   [phone]

## 2018-06-06 NOTE — Telephone Encounter (Signed)
Dr. Fry please advise. Thanks  

## 2018-06-07 ENCOUNTER — Telehealth: Payer: Self-pay | Admitting: Hematology & Oncology

## 2018-06-07 ENCOUNTER — Inpatient Hospital Stay: Payer: Medicare Other

## 2018-06-07 ENCOUNTER — Inpatient Hospital Stay: Payer: Medicare Other | Attending: Hematology & Oncology | Admitting: Hematology & Oncology

## 2018-06-07 VITALS — HR 67 | Temp 98.3°F | Resp 18 | Ht 72.0 in | Wt 251.8 lb

## 2018-06-07 DIAGNOSIS — Z79899 Other long term (current) drug therapy: Secondary | ICD-10-CM | POA: Diagnosis not present

## 2018-06-07 DIAGNOSIS — Z7982 Long term (current) use of aspirin: Secondary | ICD-10-CM

## 2018-06-07 DIAGNOSIS — Z7901 Long term (current) use of anticoagulants: Secondary | ICD-10-CM

## 2018-06-07 DIAGNOSIS — I10 Essential (primary) hypertension: Secondary | ICD-10-CM

## 2018-06-07 DIAGNOSIS — I82531 Chronic embolism and thrombosis of right popliteal vein: Secondary | ICD-10-CM

## 2018-06-07 DIAGNOSIS — I82401 Acute embolism and thrombosis of unspecified deep veins of right lower extremity: Secondary | ICD-10-CM

## 2018-06-07 LAB — CMP (CANCER CENTER ONLY)
ALBUMIN: 4.1 g/dL (ref 3.5–5.0)
ALT: 19 U/L (ref 0–44)
AST: 21 U/L (ref 15–41)
Alkaline Phosphatase: 81 U/L (ref 38–126)
Anion gap: 7 (ref 5–15)
BUN: 23 mg/dL (ref 8–23)
CHLORIDE: 109 mmol/L (ref 98–111)
CO2: 24 mmol/L (ref 22–32)
CREATININE: 1.76 mg/dL — AB (ref 0.61–1.24)
Calcium: 9.2 mg/dL (ref 8.9–10.3)
GFR, EST AFRICAN AMERICAN: 46 mL/min — AB (ref >60)
GFR, Estimated: 39 mL/min — ABNORMAL LOW (ref >60)
GLUCOSE: 109 mg/dL — AB (ref 70–99)
Potassium: 4.1 mmol/L (ref 3.5–5.1)
SODIUM: 140 mmol/L (ref 135–145)
Total Bilirubin: 0.5 mg/dL (ref 0.3–1.2)
Total Protein: 6.4 g/dL — ABNORMAL LOW (ref 6.5–8.1)

## 2018-06-07 LAB — CBC WITH DIFFERENTIAL (CANCER CENTER ONLY)
Abs Immature Granulocytes: 0.03 10*3/uL (ref 0.00–0.07)
BASOS ABS: 0.1 10*3/uL (ref 0.0–0.1)
Basophils Relative: 1 %
EOS PCT: 3 %
Eosinophils Absolute: 0.1 10*3/uL (ref 0.0–0.5)
HEMATOCRIT: 43.7 % (ref 39.0–52.0)
HEMOGLOBIN: 14.3 g/dL (ref 13.0–17.0)
IMMATURE GRANULOCYTES: 1 %
LYMPHS ABS: 1 10*3/uL (ref 0.7–4.0)
LYMPHS PCT: 20 %
MCH: 31.4 pg (ref 26.0–34.0)
MCHC: 32.7 g/dL (ref 30.0–36.0)
MCV: 96 fL (ref 80.0–100.0)
MONOS PCT: 5 %
Monocytes Absolute: 0.2 10*3/uL (ref 0.1–1.0)
NRBC: 0 % (ref 0.0–0.2)
Neutro Abs: 3.7 10*3/uL (ref 1.7–7.7)
Neutrophils Relative %: 70 %
Platelet Count: 161 10*3/uL (ref 150–400)
RBC: 4.55 MIL/uL (ref 4.22–5.81)
RDW: 11.9 % (ref 11.5–15.5)
WBC Count: 5.2 10*3/uL (ref 4.0–10.5)

## 2018-06-07 NOTE — Telephone Encounter (Signed)
Appointments scheduled unable to print calendar/ handwritten schedule was given to the patient along with my name and number should he have any questions per 12/4 los

## 2018-06-07 NOTE — Progress Notes (Signed)
Hematology and Oncology Follow Up Visit  Joseph Tran [id number] [birth year] 67 y.o. 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  I last saw him 9 months ago.  He was supposed to have his knee replaced for his right knee.  However, there is been some problems.  He developed some venous stasis ulcers on the right leg.  I am not sure how he had developed these.  These are getting better.  We did do a Doppler of his left leg back in March.  The Doppler of the left leg was negative for any thrombus.  He is wearing compression stockings on the legs.  The other issue is that he has a horse that is very sick.  He has to try to be there and help the horse.  This really is sad.  His horses 67 years old.  It sounds like the knee surgery probably will not be until the fall 2020.  Otherwise, he seems to be doing okay.  He is had no bleeding.  Is had no abdominal pain.  He has had no cough or shortness of breath.  He has had no rashes, outside of the venous stasis ulcers in the right leg.  Overall, his performance status is ECOG 1.    Medications:  Allergies as of 06/07/2018      Reactions   Allopurinol Other (See Comments)   PATIENT PREFERENCE Pt refused due to mom developing steven johnson syndrome.   Penicillins Hives   Has patient had a PCN reaction causing immediate rash, facial/tongue/throat swelling, SOB or lightheadedness with hypotension: Unknown Has patient had a PCN reaction causing severe rash involving mucus membranes or skin necrosis:Unknown Has patient had a PCN reaction that required hospitalization: No Has patient had a PCN reaction occurring within the last 10 years: No If all of the above answers are "NO", then may proceed with Cephalosporin use.     Sulfamethoxazole Hives   Sulfonamide Derivatives Hives   Dilaudid [hydromorphone Hcl] Nausea And Vomiting      Medication List        Accurate as of 06/07/18  5:19 PM. Always use your most recent med list.          albuterol 108 (90 Base) MCG/ACT inhaler Commonly known as:  PROVENTIL HFA;VENTOLIN HFA Inhale 2 puffs into the lungs every 4 (four) hours as needed for wheezing or shortness of breath.   aspirin EC 81 MG tablet Take 162 mg by mouth daily.   buPROPion 300 MG 24 hr tablet Commonly known as:  WELLBUTRIN XL TAKE 1 TABLET BY MOUTH  DAILY   clonazePAM 1 MG tablet Commonly known as:  KLONOPIN TAKE 1 TABLET BY MOUTH TWICE A DAY AS NEEDED FOR ANXIETY   desvenlafaxine 50 MG 24 hr tablet Commonly known as:  PRISTIQ Take 1 tablet (50 mg total) by mouth daily.   EQ VEGETABLE LAXATIVE 8.6 MG tablet Generic drug:  senna Take 1 tablet by mouth daily.   finasteride 5 MG tablet Commonly known as:  PROSCAR Take 5 mg by mouth at bedtime.   furosemide 20 MG tablet Commonly known as:  LASIX Take 1 tablet (20 mg total) by mouth daily.   GAMMA AMINOBUTYRIC ACID PO Take 750 mg by mouth daily.  GABA   hydroxypropyl methylcellulose / hypromellose 2.5 % ophthalmic solution Commonly known as:  ISOPTO TEARS / GONIOVISC Place 1-2 drops into both eyes 3 (three) times daily as needed for dry eyes ((SCHEDULED EACH MORNING)).   Melatonin 3 MG Tbdp Take 3 mg by mouth at bedtime.   multivitamin with minerals Tabs tablet Take 1 tablet by mouth daily.   psyllium 28 % packet Commonly known as:  METAMUCIL SMOOTH TEXTURE Take 1 packet by mouth at bedtime.   rivaroxaban 10 MG Tabs tablet Commonly known as:  XARELTO Take 1 tablet (10 mg total) by mouth daily with supper.   STIMULANT LAXATIVE 5 MG EC tablet Generic drug:  bisacodyl Take 5 mg by mouth at bedtime.   tamsulosin 0.4 MG Caps capsule Commonly known as:  FLOMAX Take 0.4 mg by mouth at bedtime.   traMADol 50 MG  tablet Commonly known as:  ULTRAM Take 50 mg by mouth.   TYLENOL 8 HOUR ARTHRITIS PAIN 650 MG CR tablet Generic drug:  acetaminophen Take 1,300 mg by mouth 3 (three) times daily.       Allergies:  Allergies  Allergen Reactions  . Allopurinol Other (See Comments)    PATIENT PREFERENCE Pt refused due to mom developing steven johnson syndrome.  [name] Penicillins Hives    Has patient had a PCN reaction causing immediate rash, facial/tongue/throat swelling, SOB or lightheadedness with hypotension: Unknown Has patient had a PCN reaction causing severe rash involving mucus membranes or skin necrosis:Unknown Has patient had a PCN reaction that required hospitalization: No Has patient had a PCN reaction occurring within the last 10 years: No If all of the above answers are "NO", then may proceed with Cephalosporin use.   . Sulfamethoxazole Hives  . Sulfonamide Derivatives Hives  . Dilaudid [Hydromorphone Hcl] Nausea And Vomiting    Past Medical History, Surgical history, Social history, and Family History were reviewed and updated.  Review of Systems: Review of Systems  Constitutional: Negative.   HENT: Negative.   Eyes: Negative.   Respiratory: Negative.   Cardiovascular: Positive for leg swelling.  Gastrointestinal: Negative.   Genitourinary: Negative.   Musculoskeletal: Negative.   Skin: Negative.   Neurological: Negative.   Endo/Heme/Allergies: Negative.   Psychiatric/Behavioral: Negative.      Physical Exam:  height is 6' (1.829 m) and weight is 251 lb 12.8 oz (114.2 kg). His oral temperature is 98.3 F (36.8 C). His pulse is 67. His respiration is 18 and oxygen saturation is 97%.   Wt Readings from Last 3 Encounters:  06/07/18 251 lb 12.8 oz (114.2 kg)  09/28/17 252 lb 4 oz (114.4 kg)  09/13/17 251 lb 3.2 oz (113.9 kg)    Physical Exam  Constitutional: He is oriented to person, place, and time.  HENT:  Head: Normocephalic and atraumatic.  Mouth/Throat:  Oropharynx is clear and moist.  Eyes: Pupils are equal, round, and reactive to light. EOM are normal.  Neck: Normal range of motion.  Cardiovascular: Normal rate, regular rhythm and normal heart sounds.  Pulmonary/Chest: Effort normal and breath sounds normal.  Abdominal: Soft. Bowel sounds are normal.  Musculoskeletal: Normal range of motion. He exhibits no edema, tenderness or deformity.  His right knee is swollen.  There is some warmth.  He has some fluid.  He has decreased range of motion.  There is some tenderness to palpation.  Lymphadenopathy:    He has no cervical adenopathy.  Neurological: He is alert and oriented to person, place, and time.  Skin:  Skin is warm and dry. No rash noted. No erythema.  Psychiatric: He has a normal mood and affect. His behavior is normal. Judgment and thought content normal.  Vitals reviewed.    Lab Results  Component Value Date   WBC 5.2 06/07/2018   HGB 14.3 06/07/2018   HCT 43.7 06/07/2018   MCV 96.0 06/07/2018   PLT 161 06/07/2018   Lab Results  Component Value Date   FERRITIN 116 03/31/2017   IRON 101 03/31/2017   TIBC 286 03/31/2017   UIBC 185 03/31/2017   IRONPCTSAT 35 03/31/2017   Lab Results  Component Value Date   RBC 4.55 06/07/2018   No results found for: KPAFRELGTCHN, LAMBDASER, KAPLAMBRATIO No results found for: IGGSERUM, IGA, IGMSERUM No results found for: Odetta Pink, SPEI   Chemistry      Component Value Date/Time   NA 140 06/07/2018 1441   NA 145 06/29/2017 1452   K 4.1 06/07/2018 1441   K 4.7 06/29/2017 1452   CL 109 06/07/2018 1441   CL 107 06/29/2017 1452   CO2 24 06/07/2018 1441   CO2 25 06/29/2017 1452   BUN 23 06/07/2018 1441   BUN 33 (H) 06/29/2017 1452   CREATININE 1.76 (H) 06/07/2018 1441   CREATININE 2.1 (H) 06/29/2017 1452      Component Value Date/Time   CALCIUM 9.2 06/07/2018 1441   CALCIUM 9.5 06/29/2017 1452   ALKPHOS 81 06/07/2018  1441   ALKPHOS 83 06/29/2017 1452   AST 21 06/07/2018 1441   ALT 19 06/07/2018 1441   ALT 23 06/29/2017 1452   BILITOT 0.5 06/07/2018 1441     Impression and Plan: Joseph Tran is a 67 yo gentleman with 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   I suppose that the thrombotic episodes that he has had in the right leg could be causing the problems with his ulcers.  However, it seems like the ulcers are improving which is nice to see.  For right now, we will plan to get him back in another 6 months.  I think this would be a good amount of time before we have to see him again.  I do not think we need to do a Doppler of his right leg.  He will continue the Xarelto and aspirin.  Hopefully, is worse will get better.  Hopefully, he will get the knee surgery done earlier than the fall 2020.   Volanda Napoleon, MD 12/4/20195:19 PM

## 2018-06-22 DIAGNOSIS — M25561 Pain in right knee: Secondary | ICD-10-CM | POA: Diagnosis not present

## 2018-07-27 DIAGNOSIS — E039 Hypothyroidism, unspecified: Secondary | ICD-10-CM | POA: Diagnosis not present

## 2018-07-27 DIAGNOSIS — I10 Essential (primary) hypertension: Secondary | ICD-10-CM | POA: Diagnosis not present

## 2018-07-27 DIAGNOSIS — N183 Chronic kidney disease, stage 3 (moderate): Secondary | ICD-10-CM | POA: Diagnosis not present

## 2018-07-27 DIAGNOSIS — D649 Anemia, unspecified: Secondary | ICD-10-CM | POA: Diagnosis not present

## 2018-07-27 DIAGNOSIS — E7800 Pure hypercholesterolemia, unspecified: Secondary | ICD-10-CM | POA: Diagnosis not present

## 2018-07-27 DIAGNOSIS — R809 Proteinuria, unspecified: Secondary | ICD-10-CM | POA: Diagnosis not present

## 2018-07-27 DIAGNOSIS — N189 Chronic kidney disease, unspecified: Secondary | ICD-10-CM | POA: Diagnosis not present

## 2018-08-03 ENCOUNTER — Telehealth: Payer: Self-pay | Admitting: Family Medicine

## 2018-08-04 NOTE — Telephone Encounter (Signed)
Dr. Fry please advise. Thanks  

## 2018-08-07 NOTE — Telephone Encounter (Signed)
rx has been printed out and signed by Dr. Sarajane Jews.  This has been [facility] to [facility].

## 2018-08-07 NOTE — Telephone Encounter (Signed)
Call in #[phone] with one rf  

## 2018-08-08 NOTE — Telephone Encounter (Signed)
Pt calling stating that [facility] hasn't received the RX for Klonopin please resend to mail service

## 2018-08-08 NOTE — Telephone Encounter (Signed)
[facility]ed [facility].

## 2018-08-29 ENCOUNTER — Other Ambulatory Visit: Payer: Self-pay | Admitting: Family Medicine

## 2018-09-07 DIAGNOSIS — M67911 Unspecified disorder of synovium and tendon, right shoulder: Secondary | ICD-10-CM | POA: Diagnosis not present

## 2018-09-07 DIAGNOSIS — M1711 Unilateral primary osteoarthritis, right knee: Secondary | ICD-10-CM | POA: Diagnosis not present

## 2018-09-07 DIAGNOSIS — M545 Low back pain: Secondary | ICD-10-CM | POA: Diagnosis not present

## 2018-10-23 IMAGING — US US EXTREM LOW VENOUS*R*
1 series · 13 of 24 positions shown · non-contrast
Comparison: 05/21/2016

CLINICAL DATA: History of right lower extremity DVT.



[Series 1: us extrem low venous*right* · 0.08mm/px · 13 of 31 slices shown]
[im 1/31]
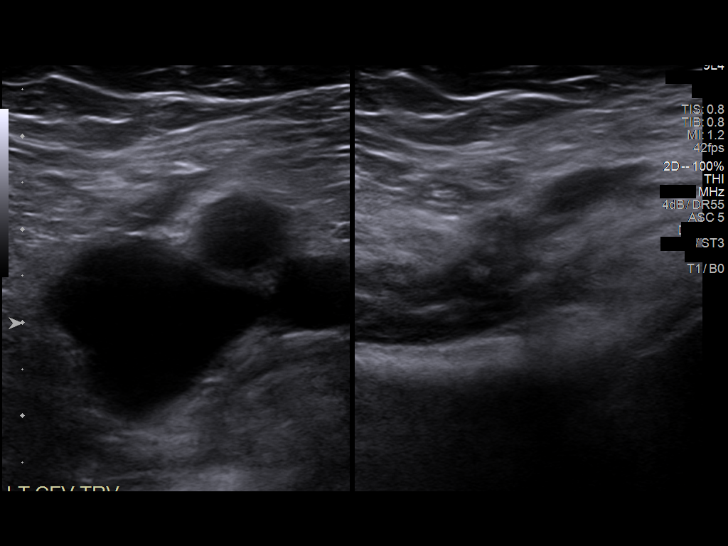
[im 3/31]
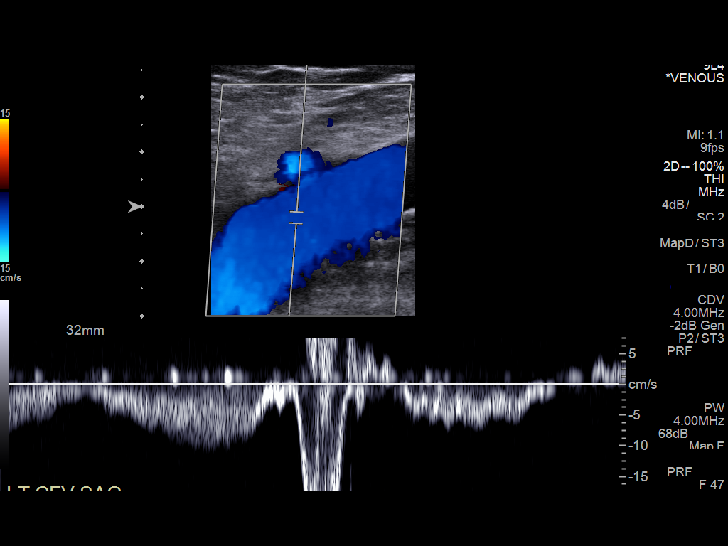
[im 6/31]
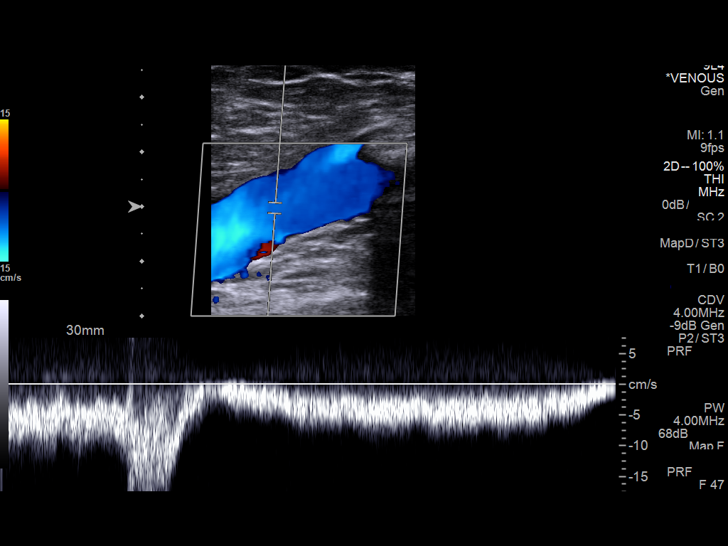
[im 8/31]
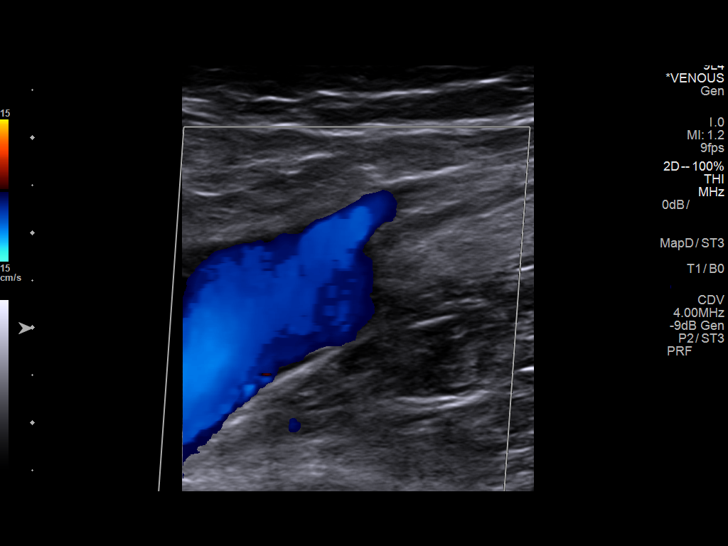
[im 11/31]
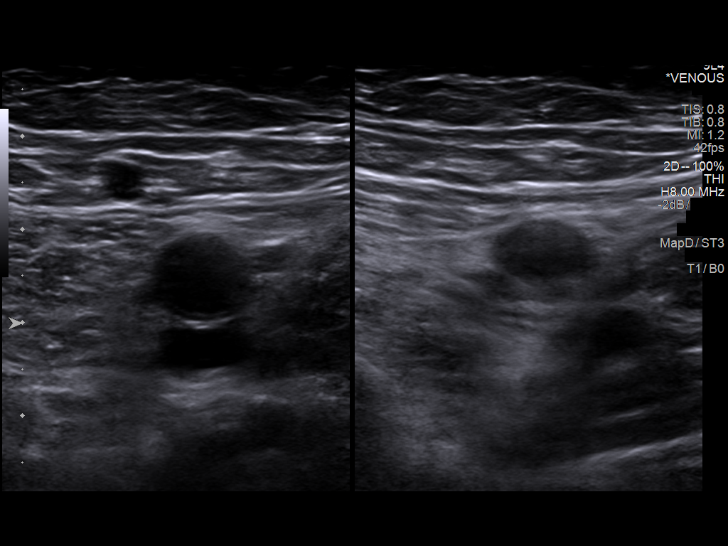
[im 14/31]
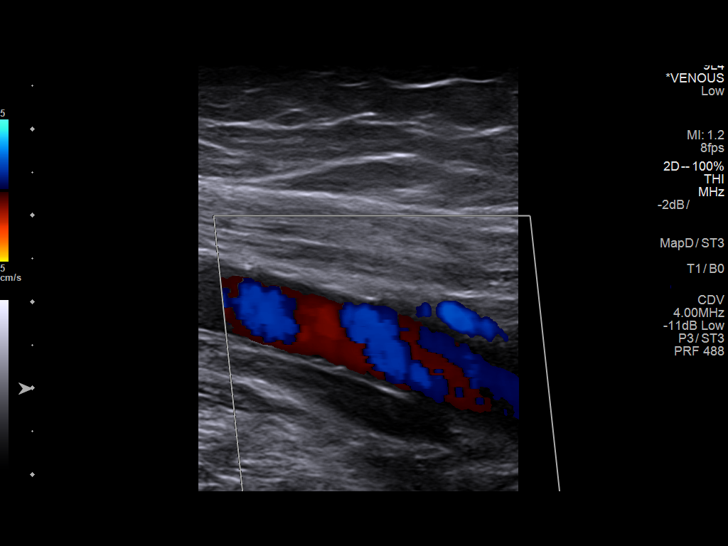
[im 16/31]
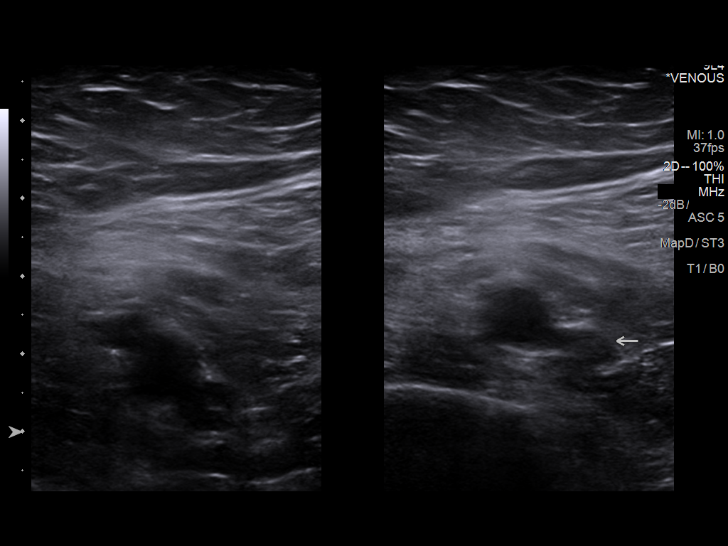
[im 17/31]
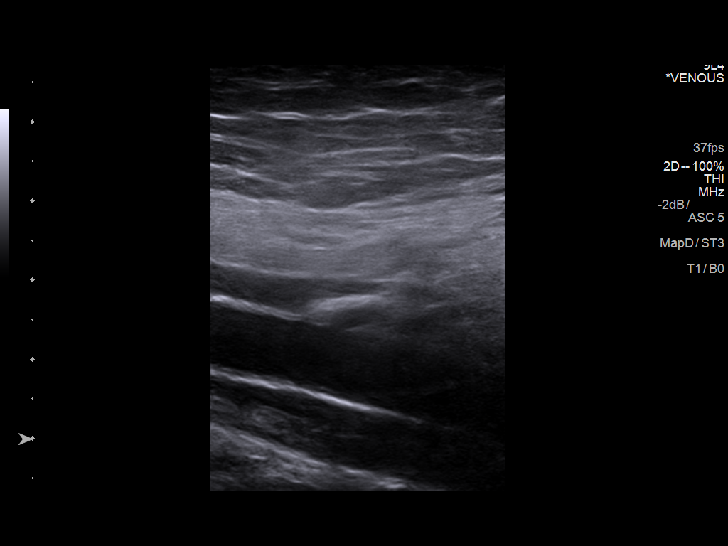
[im 20/31]
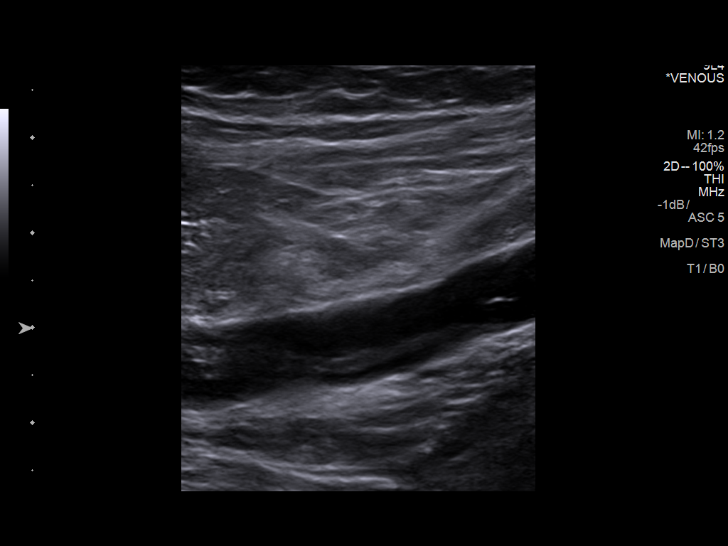
[im 23/31]
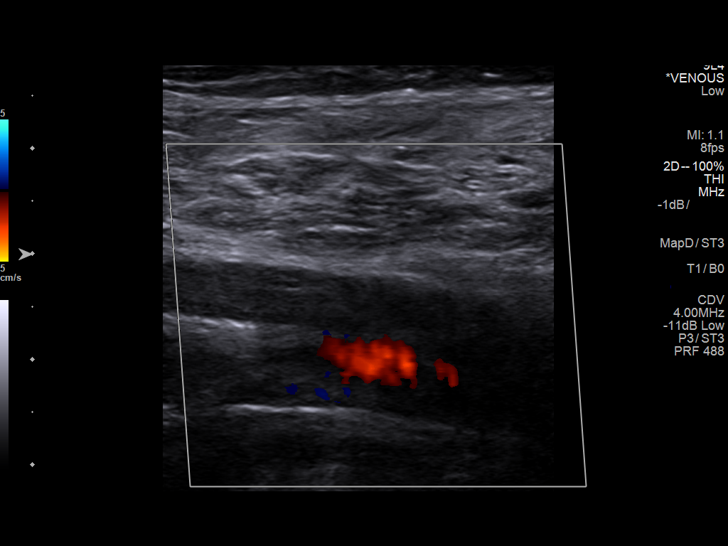
[im 25/31]
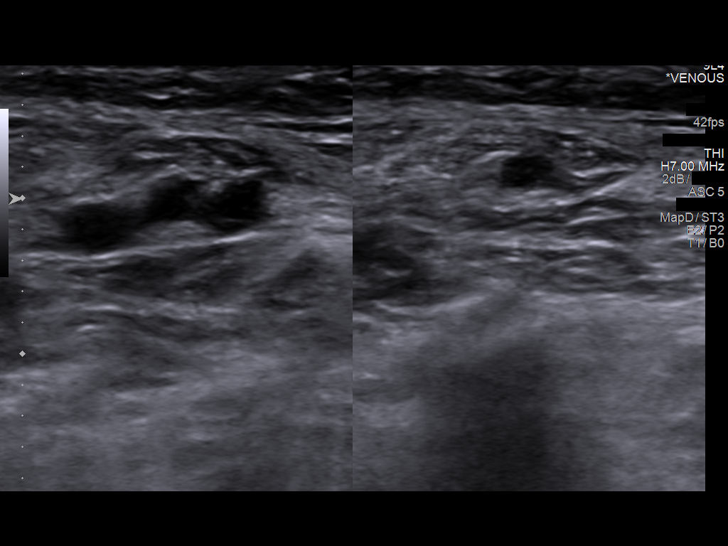
[im 28/31]
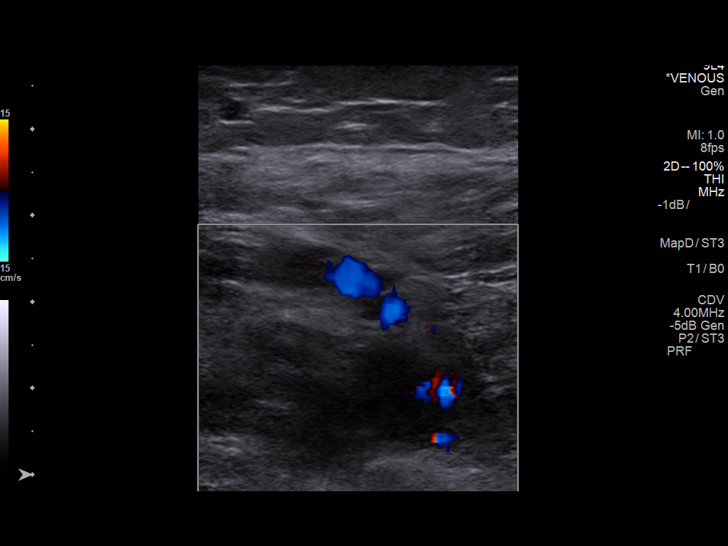
[im 31/31]
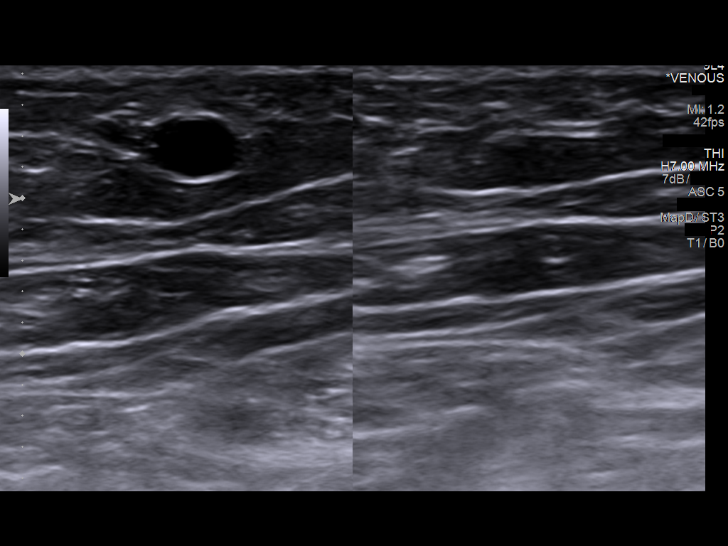

[13 of 24 positions shown; findings below may reference images not displayed]

FINDINGS: Contralateral Common Femoral Vein: Respiratory phasicity is normal
and symmetric with the symptomatic side. No evidence of thrombus.
Normal compressibility.

Common Femoral Vein: No evidence of thrombus. Normal
compressibility, respiratory phasicity and response to augmentation.

Saphenofemoral Junction: No evidence of thrombus. Normal
compressibility and flow on color Doppler imaging.

Profunda Femoral Vein: No evidence of thrombus. Normal
compressibility and flow on color Doppler imaging.

Femoral Vein: There is evidence of thrombus in the distal femoral
vein, as before.

Popliteal Vein: Persistent thrombus noted within the popliteal vein.

Calf Veins: No evidence of thrombus. Normal compressibility and flow
on color Doppler imaging.

Other Findings:  None.
IMPRESSION: Persistent thrombus in the distal aspect of the right femoral vein
and in the right popliteal vein, similar to prior study. Thrombus in
the popliteal vein is occlusive.

## 2018-10-26 ENCOUNTER — Other Ambulatory Visit: Payer: Self-pay | Admitting: *Deleted

## 2018-10-26 DIAGNOSIS — I82401 Acute embolism and thrombosis of unspecified deep veins of right lower extremity: Secondary | ICD-10-CM

## 2018-10-26 MED ORDER — RIVAROXABAN 20 MG PO TABS: ORAL_TABLET | ORAL | 3 refills | Status: DC

## 2018-11-08 ENCOUNTER — Inpatient Hospital Stay (HOSPITAL_BASED_OUTPATIENT_CLINIC_OR_DEPARTMENT_OTHER): Payer: Medicare Other | Admitting: Hematology & Oncology

## 2018-11-08 ENCOUNTER — Other Ambulatory Visit: Payer: Self-pay

## 2018-11-08 ENCOUNTER — Inpatient Hospital Stay: Payer: Medicare Other | Attending: Hematology & Oncology

## 2018-11-08 ENCOUNTER — Encounter: Payer: Self-pay | Admitting: Hematology & Oncology

## 2018-11-08 VITALS — BP 125/80 | HR 69 | Temp 98.0°F | Resp 18 | Ht 72.0 in | Wt 247.1 lb

## 2018-11-08 DIAGNOSIS — Z880 Allergy status to penicillin: Secondary | ICD-10-CM | POA: Diagnosis not present

## 2018-11-08 DIAGNOSIS — Z885 Allergy status to narcotic agent status: Secondary | ICD-10-CM

## 2018-11-08 DIAGNOSIS — Z79899 Other long term (current) drug therapy: Secondary | ICD-10-CM | POA: Diagnosis not present

## 2018-11-08 DIAGNOSIS — I82531 Chronic embolism and thrombosis of right popliteal vein: Secondary | ICD-10-CM | POA: Diagnosis not present

## 2018-11-08 DIAGNOSIS — Z7901 Long term (current) use of anticoagulants: Secondary | ICD-10-CM | POA: Insufficient documentation

## 2018-11-08 DIAGNOSIS — N289 Disorder of kidney and ureter, unspecified: Secondary | ICD-10-CM

## 2018-11-08 DIAGNOSIS — Z882 Allergy status to sulfonamides status: Secondary | ICD-10-CM | POA: Diagnosis not present

## 2018-11-08 DIAGNOSIS — M7989 Other specified soft tissue disorders: Secondary | ICD-10-CM

## 2018-11-08 DIAGNOSIS — I82511 Chronic embolism and thrombosis of right femoral vein: Secondary | ICD-10-CM | POA: Insufficient documentation

## 2018-11-08 DIAGNOSIS — I82401 Acute embolism and thrombosis of unspecified deep veins of right lower extremity: Secondary | ICD-10-CM

## 2018-11-08 LAB — CBC WITH DIFFERENTIAL (CANCER CENTER ONLY)
Abs Immature Granulocytes: 0.05 10*3/uL (ref 0.00–0.07)
Basophils Absolute: 0.1 10*3/uL (ref 0.0–0.1)
Basophils Relative: 1 %
Eosinophils Absolute: 0.2 10*3/uL (ref 0.0–0.5)
Eosinophils Relative: 4 %
HCT: 44.6 % (ref 39.0–52.0)
Hemoglobin: 14.7 g/dL (ref 13.0–17.0)
Immature Granulocytes: 1 %
Lymphocytes Relative: 17 %
Lymphs Abs: 1 10*3/uL (ref 0.7–4.0)
MCH: 31.6 pg (ref 26.0–34.0)
MCHC: 33 g/dL (ref 30.0–36.0)
MCV: 95.9 fL (ref 80.0–100.0)
Monocytes Absolute: 0.4 10*3/uL (ref 0.1–1.0)
Monocytes Relative: 7 %
Neutro Abs: 4 10*3/uL (ref 1.7–7.7)
Neutrophils Relative %: 70 %
Platelet Count: 198 10*3/uL (ref 150–400)
RBC: 4.65 MIL/uL (ref 4.22–5.81)
RDW: 12.1 % (ref 11.5–15.5)
WBC Count: 5.8 10*3/uL (ref 4.0–10.5)
nRBC: 0 % (ref 0.0–0.2)

## 2018-11-08 LAB — CMP (CANCER CENTER ONLY)
ALT: 18 U/L (ref 0–44)
AST: 20 U/L (ref 15–41)
Albumin: 4.3 g/dL (ref 3.5–5.0)
Alkaline Phosphatase: 83 U/L (ref 38–126)
Anion gap: 8 (ref 5–15)
BUN: 20 mg/dL (ref 8–23)
CO2: 25 mmol/L (ref 22–32)
Calcium: 9.8 mg/dL (ref 8.9–10.3)
Chloride: 108 mmol/L (ref 98–111)
Creatinine: 1.92 mg/dL — ABNORMAL HIGH (ref 0.61–1.24)
GFR, Est AFR Am: 41 mL/min — ABNORMAL LOW (ref >60)
GFR, Estimated: 35 mL/min — ABNORMAL LOW (ref >60)
Glucose, Bld: 89 mg/dL (ref 70–99)
Potassium: 5 mmol/L (ref 3.5–5.1)
Sodium: 141 mmol/L (ref 135–145)
Total Bilirubin: 0.6 mg/dL (ref 0.3–1.2)
Total Protein: 6.8 g/dL (ref 6.5–8.1)

## 2018-11-08 LAB — D-DIMER, QUANTITATIVE: D-Dimer, Quant: 0.32 ug/mL-FEU (ref 0.00–0.50)

## 2018-11-08 NOTE — Progress Notes (Signed)
Hematology and Oncology Follow Up Visit  Joseph Tran [id number] 04-05-1951 68 y.o. 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  All of his course does have been canceled so far.  Maybe, that will have 1 over Memorial Day weekend but he is not too confident about that.  He is doing well otherwise.  He does have a lot of problems with his left knee.  He still needs surgery for this.  He is trying to hold off on this as long as possible.  He is having no issues with the Xarelto.  He is on a low dose because of his renal function.  Thankfully his renal function has not worsened.  He has had some mild swelling in the right leg.  This is chronic.  He does wear compression stockings for this.  He has had no cough or shortness of breath.  There is no chest wall pain.    Overall, his performance status is ECOG 1.    Medications:  Allergies as of 11/08/2018      Reactions   Allopurinol Other (See Comments)   PATIENT PREFERENCE Pt refused due to mom developing steven johnson syndrome.   Penicillins Hives   Has patient had a PCN reaction causing immediate rash, facial/tongue/throat swelling, SOB or lightheadedness with hypotension: Unknown Has patient had a PCN reaction causing severe rash involving mucus membranes or skin necrosis:Unknown Has patient had a PCN reaction that required hospitalization: No Has patient had a PCN reaction occurring within the last 10 years: No If all of the above answers are "NO", then may proceed with Cephalosporin use.   Sulfamethoxazole Hives   Sulfonamide Derivatives Hives   Dilaudid [hydromorphone Hcl] Nausea And Vomiting      Medication List       Accurate as of Nov 08, 2018  3:45 PM. If you have any questions, ask your nurse or doctor.        albuterol 108 (90 Base) MCG/ACT inhaler Commonly known as:  ProAir HFA Inhale 2 puffs into the lungs every 4 (four) hours as needed for wheezing or shortness of breath.   aspirin EC 81 MG tablet Take 162 mg by mouth daily.   buPROPion 300 MG 24 hr tablet Commonly known as:  WELLBUTRIN XL TAKE 1 TABLET BY MOUTH  DAILY   clonazePAM 1 MG tablet Commonly known as:  KLONOPIN TAKE 1 TABLET BY MOUTH  TWICE A DAY AS NEEDED FOR  ANXIETY   desvenlafaxine 50 MG 24 hr tablet Commonly known as:  PRISTIQ TAKE 1 TABLET BY MOUTH  DAILY   EQ Vegetable Laxative 8.6 MG tablet Generic drug:  senna Take 1 tablet by mouth daily.   finasteride 5 MG tablet Commonly known as:  PROSCAR Take 5 mg by mouth at bedtime.   furosemide 20 MG tablet Commonly known as:  LASIX Take 1 tablet (20 mg total) by mouth daily.   GAMMA AMINOBUTYRIC ACID PO Take 750 mg by mouth daily. GABA   hydroxypropyl methylcellulose / hypromellose 2.5 % ophthalmic solution Commonly known as:  ISOPTO TEARS / GONIOVISC Place 1-2 drops into both eyes 3 (three) times daily as needed for dry eyes ((SCHEDULED EACH MORNING)).  Melatonin 3 MG Tbdp Take 3 mg by mouth at bedtime.   multivitamin with minerals Tabs tablet Take 1 tablet by mouth daily.   psyllium 28 % packet Commonly known as:  METAMUCIL SMOOTH TEXTURE Take 1 packet by mouth at bedtime.   rivaroxaban 20 MG Tabs tablet Commonly known as:  XARELTO Take 1/2 tablet (10mg ) daily   Stimulant Laxative 5 MG EC tablet Generic drug:  bisacodyl Take 5 mg by mouth at bedtime.   tamsulosin 0.4 MG Caps capsule Commonly known as:  FLOMAX Take 0.4 mg by mouth at bedtime.   traMADol 50 MG tablet Commonly known as:  ULTRAM Take 50 mg by mouth.   Tylenol 8 Hour Arthritis Pain 650 MG CR tablet Generic drug:  acetaminophen Take 1,300 mg by mouth 3 (three) times daily.        Allergies:  Allergies  Allergen Reactions  . Allopurinol Other (See Comments)    PATIENT PREFERENCE Pt refused due to mom developing steven johnson syndrome.  [name] Penicillins Hives    Has patient had a PCN reaction causing immediate rash, facial/tongue/throat swelling, SOB or lightheadedness with hypotension: Unknown Has patient had a PCN reaction causing severe rash involving mucus membranes or skin necrosis:Unknown Has patient had a PCN reaction that required hospitalization: No Has patient had a PCN reaction occurring within the last 10 years: No If all of the above answers are "NO", then may proceed with Cephalosporin use.   . Sulfamethoxazole Hives  . Sulfonamide Derivatives Hives  . Dilaudid [Hydromorphone Hcl] Nausea And Vomiting    Past Medical History, Surgical history, Social history, and Family History were reviewed and updated.  Review of Systems: Review of Systems  Constitutional: Negative.   HENT: Negative.   Eyes: Negative.   Respiratory: Negative.   Cardiovascular: Positive for leg swelling.  Gastrointestinal: Negative.   Genitourinary: Negative.   Musculoskeletal: Negative.   Skin: Negative.   Neurological: Negative.   Endo/Heme/Allergies: Negative.   Psychiatric/Behavioral: Negative.      Physical Exam:  height is 6' (1.829 m) and weight is 247 lb 1.9 oz (112.1 kg). His oral temperature is 98 F (36.7 C). His blood pressure is 125/80 and his pulse is 69. His respiration is 18 and oxygen saturation is 97%.   Wt Readings from Last 3 Encounters:  11/08/18 247 lb 1.9 oz (112.1 kg)  06/07/18 251 lb 12.8 oz (114.2 kg)  09/28/17 252 lb 4 oz (114.4 kg)    Physical Exam Vitals signs reviewed.  HENT:     Head: Normocephalic and atraumatic.  Eyes:     Pupils: Pupils are equal, round, and reactive to light.  Neck:     Musculoskeletal: Normal range of motion.  Cardiovascular:     Rate and Rhythm: Normal rate and regular rhythm.     Heart sounds: Normal  heart sounds.  Pulmonary:     Effort: Pulmonary effort is normal.     Breath sounds: Normal breath sounds.  Abdominal:     General: Bowel sounds are normal.     Palpations: Abdomen is soft.  Musculoskeletal: Normal range of motion.        General: No tenderness or deformity.     Comments: His right knee is swollen.  There is some warmth.  He has some fluid.  He has decreased range of motion.  There is some tenderness to palpation.  Lymphadenopathy:     Cervical: No cervical adenopathy.  Skin:    General: Skin is warm and dry.  Findings: No erythema or rash.  Neurological:     Mental Status: He is alert and oriented to person, place, and time.  Psychiatric:        Behavior: Behavior normal.        Thought Content: Thought content normal.        Judgment: Judgment normal.      Lab Results  Component Value Date   WBC 5.8 11/08/2018   HGB 14.7 11/08/2018   HCT 44.6 11/08/2018   MCV 95.9 11/08/2018   PLT 198 11/08/2018   Lab Results  Component Value Date   FERRITIN 116 03/31/2017   IRON 101 03/31/2017   TIBC 286 03/31/2017   UIBC 185 03/31/2017   IRONPCTSAT 35 03/31/2017   Lab Results  Component Value Date   RBC 4.65 11/08/2018   No results found for: KPAFRELGTCHN, LAMBDASER, KAPLAMBRATIO No results found for: IGGSERUM, IGA, IGMSERUM No results found for: Odetta Pink, SPEI   Chemistry      Component Value Date/Time   NA 141 11/08/2018 1443   NA 145 06/29/2017 1452   K 5.0 11/08/2018 1443   K 4.7 06/29/2017 1452   CL 108 11/08/2018 1443   CL 107 06/29/2017 1452   CO2 25 11/08/2018 1443   CO2 25 06/29/2017 1452   BUN 20 11/08/2018 1443   BUN 33 (H) 06/29/2017 1452   CREATININE 1.92 (H) 11/08/2018 1443   CREATININE 2.1 (H) 06/29/2017 1452      Component Value Date/Time   CALCIUM 9.8 11/08/2018 1443   CALCIUM 9.5 06/29/2017 1452   ALKPHOS 83 11/08/2018 1443   ALKPHOS 83 06/29/2017 1452   AST 20  11/08/2018 1443   ALT 18 11/08/2018 1443   ALT 23 06/29/2017 1452   BILITOT 0.6 11/08/2018 1443     Impression and Plan: Joseph Tran is a 68 yo gentleman with 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   Overall, I think he is doing fairly well.  I think he is pretty stable.  I think we get him back after Labor Day now.  We will try to get him through the summer.  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  If he needs any surgery for his knee, I am sure orthopedic surgery will let me know.   Volanda Napoleon, MD 5/6/20203:45 PM

## 2018-11-09 ENCOUNTER — Telehealth: Payer: Self-pay | Admitting: Hematology & Oncology

## 2018-11-09 NOTE — Telephone Encounter (Signed)
lmom to inform pt of 9/2 appt at 245 pm per 5/6 LOS

## 2018-11-28 ENCOUNTER — Other Ambulatory Visit: Payer: Self-pay | Admitting: Family Medicine

## 2018-11-30 DIAGNOSIS — D649 Anemia, unspecified: Secondary | ICD-10-CM | POA: Diagnosis not present

## 2018-11-30 DIAGNOSIS — N189 Chronic kidney disease, unspecified: Secondary | ICD-10-CM | POA: Diagnosis not present

## 2018-11-30 DIAGNOSIS — N183 Chronic kidney disease, stage 3 (moderate): Secondary | ICD-10-CM | POA: Diagnosis not present

## 2018-12-04 ENCOUNTER — Other Ambulatory Visit: Payer: Self-pay

## 2018-12-04 ENCOUNTER — Other Ambulatory Visit: Payer: Self-pay | Admitting: Family

## 2018-12-04 ENCOUNTER — Ambulatory Visit (HOSPITAL_BASED_OUTPATIENT_CLINIC_OR_DEPARTMENT_OTHER)
Admission: RE | Admit: 2018-12-04 | Discharge: 2018-12-04 | Disposition: A | Discharge status: Home / Self Care | Payer: Medicare Other | Source: Ambulatory Visit | Attending: Family | Admitting: Family

## 2018-12-04 ENCOUNTER — Telehealth: Payer: Self-pay | Admitting: *Deleted

## 2018-12-04 DIAGNOSIS — I82401 Acute embolism and thrombosis of unspecified deep veins of right lower extremity: Secondary | ICD-10-CM

## 2018-12-04 DIAGNOSIS — I82531 Chronic embolism and thrombosis of right popliteal vein: Secondary | ICD-10-CM | POA: Diagnosis not present

## 2018-12-04 NOTE — Telephone Encounter (Signed)
Patient was in an accident last week and had an appointment with one of his physicians on Friday. He described to them a pain that extended from his back down his right leg. He has a history of DVT in that right leg. The physician instructed him to call us to have his DVT evaluated.  Reviewed with Laverna Peace NP and an order for doppler was placed.   Patient is aware of order. Gave him the number to radiology to schedule this appointment. Will follow up with him after results are obtained.

## 2018-12-05 ENCOUNTER — Telehealth: Payer: Self-pay | Admitting: *Deleted

## 2018-12-05 DIAGNOSIS — M5441 Lumbago with sciatica, right side: Secondary | ICD-10-CM | POA: Diagnosis not present

## 2018-12-05 DIAGNOSIS — M545 Low back pain: Secondary | ICD-10-CM | POA: Diagnosis not present

## 2018-12-05 NOTE — Telephone Encounter (Addendum)
Detailed message left on patient's personal voice mail  ----- Message from Eliezer Bottom, NP sent at 12/05/2018 10:24 AM EDT ----- Chronic thrombus unchanged, no changes! Thank you!   Sarah ----- Message ----- From: Buel Ream, Rad Results In Sent: 12/04/2018   5:41 PM EDT To: Eliezer Bottom, NP

## 2018-12-18 DIAGNOSIS — M545 Low back pain: Secondary | ICD-10-CM | POA: Diagnosis not present

## 2018-12-18 DIAGNOSIS — M25562 Pain in left knee: Secondary | ICD-10-CM | POA: Diagnosis not present

## 2018-12-19 ENCOUNTER — Other Ambulatory Visit: Payer: Self-pay | Admitting: Orthopedic Surgery

## 2018-12-24 ENCOUNTER — Other Ambulatory Visit: Payer: Self-pay | Admitting: Family Medicine

## 2018-12-27 NOTE — Telephone Encounter (Signed)
Pt called requesting update on this. Please advise.

## 2019-01-11 ENCOUNTER — Other Ambulatory Visit: Payer: Self-pay | Admitting: Orthopedic Surgery

## 2019-01-11 ENCOUNTER — Telehealth: Payer: Self-pay | Admitting: *Deleted

## 2019-01-11 NOTE — Care Plan (Signed)
Spoke with patient prior to surgery. He will discharge to home with family and HHPT. Referral made to [facility].  Rolling walker ordered for home use. At this point no CPM has been ordered for home due to history of R leg DVT. Will order if needed.  Patient and MD aware of plan and agreeable. Choice offered.    Ladell Heads, [place]

## 2019-01-11 NOTE — Telephone Encounter (Signed)
Call received from patient stating that he is having a TKR on 01/19/19 and would like to know when he should stop his Xarelto and Aspirin.  Dr. Marin Olp notified and call placed back to patient to notify him to stop Xarelto two days prior to surgery and to stop Aspirin five days prior to surgery.  Teach back done.  Pt requests that this office call Renee at [phone] to coordinate times of holding Xarelto and Aspirin since Renee's directions were different than Dr. Antonieta Pert. Call placed to [facility] and [place] notified of updated directions for holding Xarelto and Aspirin.

## 2019-01-15 NOTE — Progress Notes (Signed)
[facility] 07-27-18 Epic NOTE TO STOP XARELTO DR ENNEVER X 2 DAYS AND ASPIRIN X 5 DAYS BEFORE SURGERY 01-11-19 Epic.

## 2019-01-15 NOTE — Patient Instructions (Addendum)
YOU  HAD A COVID 19 TEST ON 01-16-2019. ONCE YOUR COVID TEST IS COMPLETED, PLEASE BEGIN THE QUARANTINE INSTRUCTIONS AS OUTLINED IN YOUR HANDOUT.                ZESHAN SENA    Your procedure is scheduled on: 01-19-2019   Report to [facility] Main  Entrance    Report to admitting at  8:50 AM    Call this number if you have problems the morning of surgery [phone]    Remember:  NO SOLID FOOD AFTER MIDNIGHT THE NIGHT PRIOR TO SURGERY. NOTHING BY MOUTH EXCEPT CLEAR LIQUIDS UNTIL  8:20 AM. PLEASE FINISH ENSURE DRINK PER SURGEON ORDER 3 HOURS PRIOR TO SCHEDULED SURGERY TIME WHICH NEEDS TO BE COMPLETED AT 8:20 AM.   CLEAR LIQUID DIET   Foods Allowed                                                                     Foods Excluded  Coffee and tea, regular and decaf                             liquids that you cannot  Plain Jell-O in any flavor                                             see through such as: Fruit ices (not with fruit pulp)                                     milk, soups, orange juice  Iced Popsicles                                    All solid food Carbonated beverages, regular and diet                                    Cranberry, grape and apple juices Sports drinks like Gatorade Lightly seasoned clear broth or consume(fat free) Sugar, honey syrup  Sample Menu Breakfast                                Lunch                                     Supper Cranberry juice                    Beef broth                            Chicken broth Jell-O  Grape juice                           Apple juice Coffee or tea                        Jell-O                                      Popsicle                                                Coffee or tea                        Coffee or tea  _____________________________________________________________________      Take these medicines the morning of surgery with A SIP OF  WATER: Bupropion (Wellbutrin), Desvenlafaxine (Pristiq), and Finasteride (Proscar)  BRUSH YOUR TEETH MORNING OF SURGERY AND RINSE YOUR MOUTH OUT, NO CHEWING GUM CANDY OR MINTS.                               You may not have any metal on your body including hair pins and              piercings     Do not wear jewelry, cologne, lotions, powders or deodorant                   Men may shave face and neck.   Do not bring valuables to the hospital. [place].  Contacts, dentures or bridgework may not be worn into surgery.       _____________________________________________________________________             [facility] - Preparing for Surgery Before surgery, you can play an important role.  Because skin is not sterile, your skin needs to be as free of germs as possible.  You can reduce the number of germs on your skin by washing with CHG (chlorahexidine gluconate) soap before surgery.  CHG is an antiseptic cleaner which kills germs and bonds with the skin to continue killing germs even after washing. Please DO NOT use if you have an allergy to CHG or antibacterial soaps.  If your skin becomes reddened/irritated stop using the CHG and inform your nurse when you arrive at Short Stay. Do not shave (including legs and underarms) for at least 48 hours prior to the first CHG shower.  You may shave your face/neck. Please follow these instructions carefully:  1.  Shower with CHG Soap the night before surgery and the  morning of Surgery.  2.  If you choose to wash your hair, wash your hair first as usual with your  normal  shampoo.  3.  After you shampoo, rinse your hair and body thoroughly to remove the  shampoo.                           4.  Use CHG as you would any other liquid soap.  You  can apply chg directly  to the skin and wash                       Gently with a scrungie or clean washcloth.  5.  Apply the CHG Soap to your body ONLY FROM THE  NECK DOWN.   Do not use on face/ open                           Wound or open sores. Avoid contact with eyes, ears mouth and genitals (private parts).                       Wash face,  Genitals (private parts) with your normal soap.             6.  Wash thoroughly, paying special attention to the area where your surgery  will be performed.  7.  Thoroughly rinse your body with warm water from the neck down.  8.  DO NOT shower/wash with your normal soap after using and rinsing off  the CHG Soap.                9.  Pat yourself dry with a clean towel.            10.  Wear clean pajamas.            11.  Place clean sheets on your bed the night of your first shower and do not  sleep with pets. Day of Surgery : Do not apply any lotions/deodorants the morning of surgery.  Please wear clean clothes to the hospital/surgery center.  FAILURE TO FOLLOW THESE INSTRUCTIONS MAY RESULT IN THE CANCELLATION OF YOUR SURGERY PATIENT SIGNATURE_________________________________  NURSE SIGNATURE__________________________________  ________________________________________________________________________   Adam Phenix  An incentive spirometer is a tool that can help keep your lungs clear and active. This tool measures how well you are filling your lungs with each breath. Taking long deep breaths may help reverse or decrease the chance of developing breathing (pulmonary) problems (especially infection) following:  A long period of time when you are unable to move or be active. BEFORE THE PROCEDURE   If the spirometer includes an indicator to show your best effort, your nurse or respiratory therapist will set it to a desired goal.  If possible, sit up straight or lean slightly forward. Try not to slouch.  Hold the incentive spirometer in an upright position. INSTRUCTIONS FOR USE  1. Sit on the edge of your bed if possible, or sit up as far as you can in bed or on a chair. 2. Hold the incentive  spirometer in an upright position. 3. Breathe out normally. 4. Place the mouthpiece in your mouth and seal your lips tightly around it. 5. Breathe in slowly and as deeply as possible, raising the piston or the ball toward the top of the column. 6. Hold your breath for 3-5 seconds or for as long as possible. Allow the piston or ball to fall to the bottom of the column. 7. Remove the mouthpiece from your mouth and breathe out normally. 8. Rest for a few seconds and repeat Steps 1 through 7 at least 10 times every 1-2 hours when you are awake. Take your time and take a few normal breaths between deep breaths. 9. The spirometer may include an indicator to show your best effort. Use the indicator as a goal to work toward  during each repetition. 10. After each set of 10 deep breaths, practice coughing to be sure your lungs are clear. If you have an incision (the cut made at the time of surgery), support your incision when coughing by placing a pillow or rolled up towels firmly against it. Once you are able to get out of bed, walk around indoors and cough well. You may stop using the incentive spirometer when instructed by your caregiver.  RISKS AND COMPLICATIONS  Take your time so you do not get dizzy or light-headed.  If you are in pain, you may need to take or ask for pain medication before doing incentive spirometry. It is harder to take a deep breath if you are having pain. AFTER USE  Rest and breathe slowly and easily.  It can be helpful to keep track of a log of your progress. Your caregiver can provide you with a simple table to help with this. If you are using the spirometer at home, follow these instructions: [facility] IF:   You are having difficultly using the spirometer.  You have trouble using the spirometer as often as instructed.  Your pain medication is not giving enough relief while using the spirometer.  You develop fever of 100.5 F (38.1 C) or higher. SEEK  IMMEDIATE MEDICAL CARE IF:   You cough up bloody sputum that had not been present before.  You develop fever of 102 F (38.9 C) or greater.  You develop worsening pain at or near the incision site. MAKE SURE YOU:   Understand these instructions.  Will watch your condition.  Will get help right away if you are not doing well or get worse. Document Released: 11/01/2006 Document Revised: 09/13/2011 Document Reviewed: 01/02/2007 [employer] Patient Information 2014 [employer], [place].   ________________________________________________________________________  WHAT IS A BLOOD TRANSFUSION? Blood Transfusion Information  A transfusion is the replacement of blood or some of its parts. Blood is made up of multiple cells which provide different functions.  Red blood cells carry oxygen and are used for blood loss replacement.  White blood cells fight against infection.  Platelets control bleeding.  Plasma helps clot blood.  Other blood products are available for specialized needs, such as hemophilia or other clotting disorders. BEFORE THE TRANSFUSION  Who gives blood for transfusions?   Healthy volunteers who are fully evaluated to make sure their blood is safe. This is blood bank blood. Transfusion therapy is the safest it has ever been in the practice of medicine. Before blood is taken from a donor, a complete history is taken to make sure that person has no history of diseases nor engages in risky social behavior (examples are intravenous drug use or sexual activity with multiple partners). The donor's travel history is screened to minimize risk of transmitting infections, such as malaria. The donated blood is tested for signs of infectious diseases, such as HIV and hepatitis. The blood is then tested to be sure it is compatible with you in order to minimize the chance of a transfusion reaction. If you or a relative donates blood, this is often done in anticipation of surgery and is not  appropriate for emergency situations. It takes many days to process the donated blood. RISKS AND COMPLICATIONS Although transfusion therapy is very safe and saves many lives, the main dangers of transfusion include:   Getting an infectious disease.  Developing a transfusion reaction. This is an allergic reaction to something in the blood you were given. Every precaution is taken to  prevent this. The decision to have a blood transfusion has been considered carefully by your caregiver before blood is given. Blood is not given unless the benefits outweigh the risks. AFTER THE TRANSFUSION  Right after receiving a blood transfusion, you will usually feel much better and more energetic. This is especially true if your red blood cells have gotten low (anemic). The transfusion raises the level of the red blood cells which carry oxygen, and this usually causes an energy increase.  The nurse administering the transfusion will monitor you carefully for complications. HOME CARE INSTRUCTIONS  No special instructions are needed after a transfusion. You may find your energy is better. Speak with your caregiver about any limitations on activity for underlying diseases you may have. SEEK MEDICAL CARE IF:   Your condition is not improving after your transfusion.  You develop redness or irritation at the intravenous (IV) site. SEEK IMMEDIATE MEDICAL CARE IF:  Any of the following symptoms occur over the next 12 hours:  Shaking chills.  You have a temperature by mouth above 102 F (38.9 C), not controlled by medicine.  Chest, back, or muscle pain.  People around you feel you are not acting correctly or are confused.  Shortness of breath or difficulty breathing.  Dizziness and fainting.  You get a rash or develop hives.  You have a decrease in urine output.  Your urine turns a dark color or changes to pink, red, or brown. Any of the following symptoms occur over the next 10 days:  You have a  temperature by mouth above 102 F (38.9 C), not controlled by medicine.  Shortness of breath.  Weakness after normal activity.  The white part of the eye turns yellow (jaundice).  You have a decrease in the amount of urine or are urinating less often.  Your urine turns a dark color or changes to pink, red, or brown. Document Released: 06/18/2000 Document Revised: 09/13/2011 Document Reviewed: 02/05/2008 [facility] Patient Information 2014 [place], [place].  _______________________________________________________________________

## 2019-01-16 ENCOUNTER — Ambulatory Visit (HOSPITAL_COMMUNITY)
Admission: RE | Admit: 2019-01-16 | Discharge: 2019-01-16 | Disposition: A | Discharge status: Home / Self Care | Payer: Medicare Other | Source: Ambulatory Visit | Attending: Orthopedic Surgery | Admitting: Orthopedic Surgery

## 2019-01-16 ENCOUNTER — Other Ambulatory Visit (HOSPITAL_COMMUNITY)
Admission: RE | Admit: 2019-01-16 | Discharge: 2019-01-16 | Disposition: A | Discharge status: Home / Self Care | Payer: Medicare Other | Source: Ambulatory Visit | Attending: Orthopedic Surgery | Admitting: Orthopedic Surgery

## 2019-01-16 ENCOUNTER — Encounter (HOSPITAL_COMMUNITY)
Admission: RE | Admit: 2019-01-16 | Discharge: 2019-01-16 | Disposition: A | Discharge status: Home / Self Care | Payer: Medicare Other | Source: Ambulatory Visit | Attending: Orthopedic Surgery | Admitting: Orthopedic Surgery

## 2019-01-16 ENCOUNTER — Encounter (HOSPITAL_COMMUNITY): Payer: Self-pay

## 2019-01-16 ENCOUNTER — Other Ambulatory Visit: Payer: Self-pay

## 2019-01-16 DIAGNOSIS — Z01818 Encounter for other preprocedural examination: Secondary | ICD-10-CM | POA: Diagnosis not present

## 2019-01-16 DIAGNOSIS — N189 Chronic kidney disease, unspecified: Secondary | ICD-10-CM | POA: Diagnosis not present

## 2019-01-16 DIAGNOSIS — Z86718 Personal history of other venous thrombosis and embolism: Secondary | ICD-10-CM | POA: Insufficient documentation

## 2019-01-16 DIAGNOSIS — Z87442 Personal history of urinary calculi: Secondary | ICD-10-CM | POA: Diagnosis not present

## 2019-01-16 DIAGNOSIS — F329 Major depressive disorder, single episode, unspecified: Secondary | ICD-10-CM | POA: Diagnosis not present

## 2019-01-16 DIAGNOSIS — Z01811 Encounter for preprocedural respiratory examination: Secondary | ICD-10-CM

## 2019-01-16 DIAGNOSIS — M329 Systemic lupus erythematosus, unspecified: Secondary | ICD-10-CM | POA: Insufficient documentation

## 2019-01-16 DIAGNOSIS — F419 Anxiety disorder, unspecified: Secondary | ICD-10-CM | POA: Insufficient documentation

## 2019-01-16 DIAGNOSIS — K219 Gastro-esophageal reflux disease without esophagitis: Secondary | ICD-10-CM | POA: Diagnosis not present

## 2019-01-16 DIAGNOSIS — J449 Chronic obstructive pulmonary disease, unspecified: Secondary | ICD-10-CM | POA: Insufficient documentation

## 2019-01-16 DIAGNOSIS — Z7982 Long term (current) use of aspirin: Secondary | ICD-10-CM | POA: Diagnosis not present

## 2019-01-16 DIAGNOSIS — Z79899 Other long term (current) drug therapy: Secondary | ICD-10-CM | POA: Diagnosis not present

## 2019-01-16 DIAGNOSIS — G4730 Sleep apnea, unspecified: Secondary | ICD-10-CM | POA: Diagnosis not present

## 2019-01-16 DIAGNOSIS — Z7901 Long term (current) use of anticoagulants: Secondary | ICD-10-CM | POA: Diagnosis not present

## 2019-01-16 DIAGNOSIS — Z1159 Encounter for screening for other viral diseases: Secondary | ICD-10-CM | POA: Insufficient documentation

## 2019-01-16 LAB — CBC WITH DIFFERENTIAL/PLATELET
Abs Immature Granulocytes: 0.05 10*3/uL (ref 0.00–0.07)
Basophils Absolute: 0.1 10*3/uL (ref 0.0–0.1)
Basophils Relative: 1 %
Eosinophils Absolute: 0.1 10*3/uL (ref 0.0–0.5)
Eosinophils Relative: 2 %
HCT: 46.6 % (ref 39.0–52.0)
Hemoglobin: 15 g/dL (ref 13.0–17.0)
Immature Granulocytes: 1 %
Lymphocytes Relative: 17 %
Lymphs Abs: 1.1 10*3/uL (ref 0.7–4.0)
MCH: 31.2 pg (ref 26.0–34.0)
MCHC: 32.2 g/dL (ref 30.0–36.0)
MCV: 96.9 fL (ref 80.0–100.0)
Monocytes Absolute: 0.4 10*3/uL (ref 0.1–1.0)
Monocytes Relative: 7 %
Neutro Abs: 4.6 10*3/uL (ref 1.7–7.7)
Neutrophils Relative %: 72 %
Platelets: 199 10*3/uL (ref 150–400)
RBC: 4.81 MIL/uL (ref 4.22–5.81)
RDW: 12.2 % (ref 11.5–15.5)
WBC: 6.3 10*3/uL (ref 4.0–10.5)
nRBC: 0 % (ref 0.0–0.2)

## 2019-01-16 LAB — COMPREHENSIVE METABOLIC PANEL
ALT: 24 U/L (ref 0–44)
AST: 21 U/L (ref 15–41)
Albumin: 4.2 g/dL (ref 3.5–5.0)
Alkaline Phosphatase: 79 U/L (ref 38–126)
Anion gap: 10 (ref 5–15)
BUN: 21 mg/dL (ref 8–23)
CO2: 21 mmol/L — ABNORMAL LOW (ref 22–32)
Calcium: 9.4 mg/dL (ref 8.9–10.3)
Chloride: 111 mmol/L (ref 98–111)
Creatinine, Ser: 1.77 mg/dL — ABNORMAL HIGH (ref 0.61–1.24)
GFR calc Af Amer: 45 mL/min — ABNORMAL LOW (ref >60)
GFR calc non Af Amer: 39 mL/min — ABNORMAL LOW (ref >60)
Glucose, Bld: 99 mg/dL (ref 70–99)
Potassium: 4.7 mmol/L (ref 3.5–5.1)
Sodium: 142 mmol/L (ref 135–145)
Total Bilirubin: 0.7 mg/dL (ref 0.3–1.2)
Total Protein: 7.2 g/dL (ref 6.5–8.1)

## 2019-01-16 LAB — URINALYSIS, ROUTINE W REFLEX MICROSCOPIC
Bilirubin Urine: NEGATIVE
Glucose, UA: NEGATIVE mg/dL
Hgb urine dipstick: NEGATIVE
Ketones, ur: NEGATIVE mg/dL
Leukocytes,Ua: NEGATIVE
Nitrite: NEGATIVE
Protein, ur: NEGATIVE mg/dL
Specific Gravity, Urine: 1.023 (ref 1.005–1.030)
pH: 5 (ref 5.0–8.0)

## 2019-01-16 LAB — PROTIME-INR
INR: 1.1 (ref 0.8–1.2)
Prothrombin Time: 13.9 seconds (ref 11.4–15.2)

## 2019-01-16 LAB — SURGICAL PCR SCREEN
MRSA, PCR: NEGATIVE
Staphylococcus aureus: NEGATIVE

## 2019-01-16 LAB — APTT: aPTT: 31 seconds (ref 24–36)

## 2019-01-17 LAB — ABO/RH: ABO/RH(D): A POS

## 2019-01-17 LAB — SARS CORONAVIRUS 2 (TAT 6-24 HRS): SARS Coronavirus 2: NEGATIVE

## 2019-01-17 NOTE — Progress Notes (Signed)
Final ekg 01-16-19 on chart

## 2019-01-18 MED ORDER — BUPIVACAINE LIPOSOME 1.3 % IJ SUSP
20.0000 mL | INTRAMUSCULAR | Status: DC
  Filled 2019-01-18: qty 20

## 2019-01-18 MED ORDER — VANCOMYCIN HCL 10 G IV SOLR
1500.0000 mg | INTRAVENOUS | Status: AC
  Administered 2019-01-19: 09:00:00 1500 mg via INTRAVENOUS
  Filled 2019-01-18: qty 1500

## 2019-01-18 NOTE — Anesthesia Preprocedure Evaluation (Addendum)
Anesthesia Evaluation  Patient identified by MRN, date of birth, ID band Patient awake    Reviewed: Allergy & Precautions, NPO status , Patient's Chart, lab work & pertinent test results  History of Anesthesia Complications (+) PONV and history of anesthetic complications  Airway Mallampati: II  TM Distance: >3 FB Neck ROM: Full    Dental  (+) Dental Advisory Given   Pulmonary asthma , sleep apnea , COPD,    Pulmonary exam normal breath sounds clear to auscultation       Cardiovascular negative cardio ROS Normal cardiovascular exam Rhythm:Regular Rate:Normal     Neuro/Psych PSYCHIATRIC DISORDERS Anxiety Depression negative neurological ROS     GI/Hepatic Neg liver ROS, GERD  ,  Endo/Other  negative endocrine ROS  Renal/GU Renal disease     Musculoskeletal  (+) Arthritis ,   Abdominal   Peds  Hematology  (+) Blood dyscrasia, anemia ,   Anesthesia Other Findings   Reproductive/Obstetrics                                                              Anesthesia Evaluation  Patient identified by MRN, date of birth, ID band Patient awake    Reviewed: Allergy & Precautions, NPO status , Patient's Chart, lab work & pertinent test results  History of Anesthesia Complications (+) PONV  Airway Mallampati: II  TM Distance: >3 FB Neck ROM: Full    Dental  (+) Dental Advisory Given   Pulmonary sleep apnea and Continuous Positive Airway Pressure Ventilation , COPD,  COPD inhaler,    breath sounds clear to auscultation       Cardiovascular + DVT  negative cardio ROS   Rhythm:Regular Rate:Normal     Neuro/Psych Anxiety Depression negative neurological ROS     GI/Hepatic Neg liver ROS, GERD  Medicated and Controlled,  Endo/Other  Morbid obesity  Renal/GU Renal InsufficiencyRenal disease (creat 1.9)     Musculoskeletal  (+) Arthritis , Osteoarthritis,    Abdominal (+) + obese,   Peds  Hematology Xarelto: last dose 10/19   Anesthesia Other Findings   Reproductive/Obstetrics                            Anesthesia Physical Anesthesia Plan  ASA: III  Anesthesia Plan: General   Post-op Pain Management:    Induction: Intravenous  PONV Risk Score and Plan: 2 and Ondansetron and Dexamethasone  Airway Management Planned: LMA  Additional Equipment:   Intra-op Plan:   Post-operative Plan:   Informed Consent: I have reviewed the patients History and Physical, chart, labs and discussed the procedure including the risks, benefits and alternatives for the proposed anesthesia with the patient or authorized representative who has indicated his/her understanding and acceptance.   Dental advisory given  Plan Discussed with: CRNA and Surgeon  Anesthesia Plan Comments: (Plan routine monitors, GA- LMA OK)        Anesthesia Quick Evaluation  Anesthesia Physical Anesthesia Plan  ASA: III  Anesthesia Plan: Spinal   Post-op Pain Management:  Regional for Post-op pain   Induction: Intravenous  PONV Risk Score and Plan: 3 and Ondansetron, Dexamethasone, Propofol infusion and Treatment may vary due to age or medical condition  Airway Management Planned: Natural Airway  Additional Equipment:  Intra-op Plan:   Post-operative Plan:   Informed Consent: I have reviewed the patients History and Physical, chart, labs and discussed the procedure including the risks, benefits and alternatives for the proposed anesthesia with the patient or authorized representative who has indicated his/her understanding and acceptance.     Dental advisory given  Plan Discussed with: CRNA  Anesthesia Plan Comments: (See APP note by Willeen Cass, FNP)       Anesthesia Quick Evaluation

## 2019-01-18 NOTE — Progress Notes (Signed)
Pt made aware that surgery time on 01-19-19 is now 10:15 AM in lieu of 11:20 AM. Pt to arrived to admitting at 7:45 AM, and to consume ERAS drink by 7:15 AM. Pt verbalized understanding.

## 2019-01-18 NOTE — H&P (Addendum)
TOTAL KNEE ADMISSION H&P  Patient is being admitted for right total knee arthroplasty.  Subjective:  Chief Complaint:right knee pain.  HPI: Joseph Tran, 68 y.o. male, has a history of pain and functional disability in the right knee due to arthritis and has failed non-surgical conservative treatments for greater than 12 weeks to includeNSAID's and/or analgesics, corticosteriod injections and viscosupplementation injections.  Onset of symptoms was gradual, starting 4 years ago with gradually worsening course since that time. The patient noted prior procedures on the knee to include  arthroscopy and menisectomy on the right knee(s).  Patient currently rates pain in the right knee(s) at 9 out of 10 with activity. Patient has night pain, worsening of pain with activity and weight bearing, pain that interferes with activities of daily living and joint swelling.  Patient has evidence of subchondral cysts, periarticular osteophytes and joint space narrowing by imaging studies. This patient has had failure of all reasonable conservative care. There is no active infection.  Patient Active Problem List   Diagnosis Date Noted  . CKD (chronic kidney disease), stage III ([place]) 09/13/2017  . Renal atrophy, left 09/13/2017  . Systemic lupus erythematosus ([place]) 09/13/2017  . Dyslipidemia 09/13/2017  . Acute meniscal tear, lateral, right, initial encounter 04/25/2017  . Osteoarthritis of right knee 04/25/2017  . Concussion with loss of consciousness 08/05/2015  . Laceration of spleen 08/05/2015  . Lumbar stress fracture 08/05/2015  . DVT (deep venous thrombosis) ([place]) 10/18/2014  . Hx of bacterial pneumonia 03/10/2013  . COLONIC POLYPS, HX OF 04/08/2010  . NEPHROLITHIASIS, HX OF 04/08/2010  . ACOUSTIC NEUROMA 04/07/2010  . Hypogonadism male 04/07/2010  . Depression with anxiety 04/07/2010  . SLEEP APNEA, OBSTRUCTIVE 04/07/2010  . ASTHMA 04/07/2010  . BENIGN PROSTATIC HYPERTROPHY, WITH OBSTRUCTION  04/07/2010  . 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 PLANTAR FASCIITIS 04/07/2010   Past Medical History:  Diagnosis Date  . Acoustic neuroma (HCC)    benign - left  . Anemia   . Anxiety   . Arthritis   . Asthma    seasonal, when pollen is high, cold air closes me up  . Cancer ([place])    skin cancer -- arm, scalp, upper back  . Chronic kidney disease    sees Dr. [name] at [facility], left kidney is non=functioning.  right kidny is at 50%  . Colon polyps   . Complication of anesthesia   . COPD (chronic obstructive pulmonary disease) ([place])   . Depression    severe.  dx 1985  . DVT (deep venous thrombosis) ([place])    sees Dr. [name]   . GERD (gastroesophageal reflux disease)   . History of kidney stones    has kidney stone now.    . Hypogonadism male    sees Dr. Karsten Ro   . Lupus (systemic lupus erythematosus) (HCC)    se[facility] Dr. Lorenza Cambridge at [facility]   . Plantar fasciitis    rt foot  . PONV (postoperative nausea and vomiting)   . Renal atrophy, left    sees Dr. Heather Roberts at [facility]   . Sleep apnea    tested 2010      Past Surgical History:  Procedure Laterality Date  . CHEST WALL TUMOR EXCISION  2007  . COLONOSCOPY  10/2009   in [place], clear, repeat in 10 yrs   . kidney caluculs  2004-05  . KNEE ARTHROSCOPY  09-10-11   right knee, per Dr. Dorna Leitz   . KNEE ARTHROSCOPY  Right 04/25/2017   Procedure: RIGHT KNEE ARTHROSCOPY, PARTIAL LATERAL MENISECTOMY, CHONDROPLASTY MEDIAL AND LATERAL PATELLAOFEMORAL;  Surgeon: Dorna Leitz, MD;  Location: [place];  Service: Orthopedics;  Laterality: Right;  . madiscus cartilage  2009  . right knee arthroscopy  2008  . TONSILLECTOMY      Current Facility-Administered Medications  Medication Dose Route Frequency Provider Last Rate Last Dose  . [START ON 01/19/2019] bupivacaine liposome (EXPAREL) 1.3 % injection 266 mg  20 mL Other On Call to OR Dorna Leitz, MD      . Derrill Memo ON 01/19/2019] vancomycin (VANCOCIN)  1,500 mg in sodium chloride 0.9 % 500 mL IVPB  1,500 mg Intravenous 30 min Pre-Op Dorna Leitz, MD       Current Outpatient Medications  Medication Sig Dispense Refill Last Dose  . acetaminophen (TYLENOL) 500 MG tablet Take 1,000 mg by mouth 2 (two) times a day.     . albuterol (PROAIR HFA) 108 (90 Base) MCG/ACT inhaler Inhale 2 puffs into the lungs every 4 (four) hours as needed for wheezing or shortness of breath. 3 Inhaler 1   . aspirin EC 81 MG tablet Take 162 mg by mouth daily.     . bisacodyl (STIMULANT LAXATIVE) 5 MG EC tablet Take 5 mg by mouth at bedtime.     [name] buPROPion (WELLBUTRIN XL) 300 MG 24 hr tablet TAKE 1 TABLET BY MOUTH  DAILY 90 tablet 3   . clonazePAM (KLONOPIN) 1 MG tablet TAKE 1 TABLET BY MOUTH  TWICE A DAY AS NEEDED FOR  ANXIETY (Patient taking differently: Take 1 mg by mouth at bedtime. ) 180 tablet 1   . desvenlafaxine (PRISTIQ) 50 MG 24 hr tablet TAKE 1 TABLET BY MOUTH  DAILY 90 tablet 3   . finasteride (PROSCAR) 5 MG tablet Take 5 mg by mouth every morning.      . furosemide (LASIX) 20 MG tablet Take 1 tablet (20 mg total) by mouth daily. (Patient taking differently: Take 20 mg by mouth daily as needed for edema. ) 30 tablet 3   . GAMMA AMINOBUTYRIC ACID PO Take 750 mg by mouth daily. GABA     . hydroxypropyl methylcellulose / hypromellose (ISOPTO TEARS / GONIOVISC) 2.5 % ophthalmic solution Place 1-2 drops into both eyes 3 (three) times daily as needed for dry eyes ((SCHEDULED EACH MORNING)).     . Melatonin 3 MG TBDP Take 3 mg by mouth at bedtime.     . Multiple Vitamin (MULTIVITAMIN WITH MINERALS) TABS tablet Take 1 tablet by mouth daily.     . rivaroxaban (XARELTO) 20 MG TABS tablet Take 1/2 tablet (10mg ) daily (Patient not taking: Reported on 01/16/2019) 45 tablet 3   . tamsulosin (FLOMAX) 0.4 MG CAPS Take 0.4 mg by mouth at bedtime.      . traMADol (ULTRAM) 50 MG tablet Take 50 mg by mouth every 6 (six) hours as needed for moderate pain.       Allergies  Allergen  Reactions  . Allopurinol Other (See Comments)    PATIENT PREFERENCE Pt refused due to mom developing steven johnson syndrome.  [name] Penicillins Hives    Has patient had a PCN reaction causing immediate rash, facial/tongue/throat swelling, SOB or lightheadedness with hypotension: Unknown Has patient had a PCN reaction causing severe rash involving mucus membranes or skin necrosis:Unknown Has patient had a PCN reaction that required hospitalization: No Has patient had a PCN reaction occurring within the last 10 years: No If all of the above answers are "  NO", then may proceed with Cephalosporin use.   . Sulfamethoxazole Hives  . Sulfonamide Derivatives Hives  . Dilaudid [Hydromorphone Hcl] Nausea And Vomiting    Social History   Tobacco Use  . Smoking status: Never Smoker  . Smokeless tobacco: Never Used  . Tobacco comment: NEVER USED TOBACCO  Substance Use Topics  . Alcohol use: No    Alcohol/week: 0.0 standard drinks    Family History  Problem Relation Age of Onset  . Heart disease Other        parents  . Hyperlipidemia Other   . Stroke Other        grandparents  . Sudden death Other        uncle less than 60 yrs old  . Heart disease Father      ROS ROS: I have reviewed the patient's review of systems thoroughly and there are no positive responses as relates to the HPI. Objective:  Physical Exam  Vital signs in last 24 hours:    Vitals:   01/19/19 0751  BP: 136/83  Pulse: 79  Resp: 13  Temp: 98.2 F (36.8 C)  SpO2: 97%    Well-developed well-nourished patient in no acute distress. Alert and oriented x3 HEENT:within normal limits Cardiac: Regular rate and rhythm Pulmonary: Lungs clear to auscultation Abdomen: Soft and nontender.  Normal active bowel sounds  Musculoskeletal:right knee: Painful range of motion.  Limited range of motion.  No instability.  Trace effusion.  Neurovascularly intact distally. Labs: Recent Results (from the past 2160 hour(s))  CBC  with Differential (Cancer Center Only)     Status: None   Collection Time: 11/08/18  2:43 PM  Result Value Ref Range   WBC Count 5.8 4.0 - 10.5 K/uL   RBC 4.65 4.22 - 5.81 MIL/uL   Hemoglobin 14.7 13.0 - 17.0 g/dL   HCT 44.6 39.0 - 52.0 %   MCV 95.9 80.0 - 100.0 fL   MCH 31.6 26.0 - 34.0 pg   MCHC 33.0 30.0 - 36.0 g/dL   RDW 12.1 11.5 - 15.5 %   Platelet Count 198 150 - 400 K/uL   nRBC 0.0 0.0 - 0.2 %   Neutrophils Relative % 70 %   Neutro Abs 4.0 1.7 - 7.7 K/uL   Lymphocytes Relative 17 %   Lymphs Abs 1.0 0.7 - 4.0 K/uL   Monocytes Relative 7 %   Monocytes Absolute 0.4 0.1 - 1.0 K/uL   Eosinophils Relative 4 %   Eosinophils Absolute 0.2 0.0 - 0.5 K/uL   Basophils Relative 1 %   Basophils Absolute 0.1 0.0 - 0.1 K/uL   Immature Granulocytes 1 %   Abs Immature Granulocytes 0.05 0.00 - 0.07 K/uL    Comment: Performed at [facility] Lab at [facility], [address], [place], [place] [address]  D-dimer, quantitative (not at [facility])     Status: None   Collection Time: 11/08/18  2:43 PM  Result Value Ref Range   D-Dimer, Quant 0.32 0.00 - 0.50 ug/mL-FEU    Comment: (NOTE) At the manufacturer cut-off of 0.50 ug/mL FEU, this assay has been documented to exclude PE with a sensitivity and negative predictive value of 97 to 99%.  At this time, this assay has not been approved by the FDA to exclude DVT/VTE. Results should be correlated with clinical presentation. Performed at [facility], [address] [place], [place] [address]   CMP [facility] only)  Status: Abnormal   Collection Time: 11/08/18  2:43 PM  Result Value Ref Range   Sodium 141 135 - 145 mmol/L   Potassium 5.0 3.5 - 5.1 mmol/L   Chloride 108 98 - 111 mmol/L   CO2 25 22 - 32 mmol/L   Glucose, Bld 89 70 - 99 mg/dL   BUN 20 8 - 23 mg/dL   Creatinine 1.92 (H) 0.61 - 1.24 mg/dL   Calcium 9.8 8.9 - 10.3 mg/dL   Total Protein 6.8 6.5 - 8.1 g/dL   Albumin 4.3 3.5 - 5.0 g/dL    AST 20 15 - 41 U/L   ALT 18 0 - 44 U/L   Alkaline Phosphatase 83 38 - 126 U/L   Total Bilirubin 0.6 0.3 - 1.2 mg/dL   GFR, Est Non Af Am 35 (L) >60 mL/min   GFR, Est AFR Am 41 (L) >60 mL/min   Anion gap 8 5 - 15    Comment: Performed at [facility] Lab at [facility], [address], [place], [place] [address]  SARS Coronavirus 2 (Performed in [place] hospital lab)     Status: None   Collection Time: 01/16/19  2:22 PM   Specimen: Nasal Swab  Result Value Ref Range   SARS Coronavirus 2 NEGATIVE NEGATIVE    Comment: (NOTE) SARS-CoV-2 target nucleic acids are NOT DETECTED. The SARS-CoV-2 RNA is generally detectable in upper and lower respiratory specimens during the acute phase of infection. Negative results do not preclude SARS-CoV-2 infection, do not rule out co-infections with other pathogens, and should not be used as the sole basis for treatment or other patient management decisions. Negative results must be combined with clinical observations, patient history, and epidemiological information. The expected result is Negative. Fact Sheet for Patients: SugarRoll.be Fact Sheet for Healthcare Providers: https://www.woods-mathews.com/ This test is not yet approved or cleared by the [place] FDA and  has been authorized for detection and/or diagnosis of SARS-CoV-2 by FDA under an Emergency Use Authorization (EUA). This EUA will remain  in effect (meaning this test can be used) for the duration of the COVID-19 declaration under Section 56 4(b)(1) of the Act, 21 U.S.C. section 360bbb-3(b)(1), unless the authorization is terminated or revoked sooner. Performed at [facility], [place] [address], [place], [place] [address]   APTT     Status: None   Collection Time: 01/16/19  4:02 PM  Result Value Ref Range   aPTT 31 24 - 36 seconds    Comment: Performed at [facility], [place] [address] [place], [place] [address]  CBC WITH DIFFERENTIAL     Status: None   Collection Time: 01/16/19  4:02 PM  Result Value Ref Range   WBC 6.3 4.0 - 10.5 K/uL   RBC 4.81 4.22 - 5.81 MIL/uL   Hemoglobin 15.0 13.0 - 17.0 g/dL   HCT 46.6 39.0 - 52.0 %   MCV 96.9 80.0 - 100.0 fL   MCH 31.2 26.0 - 34.0 pg   MCHC 32.2 30.0 - 36.0 g/dL   RDW 12.2 11.5 - 15.5 %   Platelets 199 150 - 400 K/uL   nRBC 0.0 0.0 - 0.2 %   Neutrophils Relative % 72 %   Neutro Abs 4.6 1.7 - 7.7 K/uL   Lymphocytes Relative 17 %   Lymphs Abs 1.1 0.7 - 4.0 K/uL   Monocytes Relative 7 %   Monocytes Absolute 0.4 0.1 - 1.0 K/uL   Eosinophils  Relative 2 %   Eosinophils Absolute 0.1 0.0 - 0.5 K/uL   Basophils Relative 1 %   Basophils Absolute 0.1 0.0 - 0.1 K/uL   Immature Granulocytes 1 %   Abs Immature Granulocytes 0.05 0.00 - 0.07 K/uL    Comment: Performed at [facility], [place] [address] [place], [place] [address]  Comprehensive metabolic panel     Status: Abnormal   Collection Time: 01/16/19  4:02 PM  Result Value Ref Range   Sodium 142 135 - 145 mmol/L   Potassium 4.7 3.5 - 5.1 mmol/L   Chloride 111 98 - 111 mmol/L   CO2 21 (L) 22 - 32 mmol/L   Glucose, Bld 99 70 - 99 mg/dL   BUN 21 8 - 23 mg/dL   Creatinine, Ser 1.77 (H) 0.61 - 1.24 mg/dL   Calcium 9.4 8.9 - 10.3 mg/dL   Total Protein 7.2 6.5 - 8.1 g/dL   Albumin 4.2 3.5 - 5.0 g/dL   AST 21 15 - 41 U/L   ALT 24 0 - 44 U/L   Alkaline Phosphatase 79 38 - 126 U/L   Total Bilirubin 0.7 0.3 - 1.2 mg/dL   GFR calc non Af Amer 39 (L) >60 mL/min   GFR calc Af Amer 45 (L) >60 mL/min   Anion gap 10 5 - 15    Comment: Performed at [facility], [place] [address], [place], [place] [address]  Protime-INR     Status: None   Collection Time: 01/16/19  4:02 PM  Result Value Ref Range   Prothrombin Time 13.9 11.4 - 15.2 seconds   INR 1.1 0.8 - 1.2    Comment: (NOTE) INR goal varies based on device and disease states. Performed at  [facility], [place] [address] [place], [place] [address]   Type and screen Order type and screen if day of surgery is less than 15 days from draw of preadmission visit or order morning of surgery if day of surgery is greater than 6 days from preadmission visit.     Status: None   Collection Time: 01/16/19  4:02 PM  Result Value Ref Range   ABO/RH(D) A POS    Antibody Screen NEG    Sample Expiration 01/30/2019,2359    Extend sample reason      NO TRANSFUSIONS OR PREGNANCY IN THE PAST 3 MONTHS Performed at [place] [address], [place], [place] [address]   Urinalysis, Routine w reflex microscopic     Status: None   Collection Time: 01/16/19  4:02 PM  Result Value Ref Range   Color, Urine YELLOW YELLOW   APPearance CLEAR CLEAR   Specific Gravity, Urine 1.023 1.005 - 1.030   pH 5.0 5.0 - 8.0   Glucose, UA NEGATIVE NEGATIVE mg/dL   Hgb urine dipstick NEGATIVE NEGATIVE   Bilirubin Urine NEGATIVE NEGATIVE   Ketones, ur NEGATIVE NEGATIVE mg/dL   Protein, ur NEGATIVE NEGATIVE mg/dL   Nitrite NEGATIVE NEGATIVE   Leukocytes,Ua NEGATIVE NEGATIVE    Comment: Performed at [place] [address] [place], [place] [address]  Surgical pcr screen     Status: None   Collection Time: 01/16/19  4:02 PM   Specimen: Vein; Nasal Swab  Result Value Ref Range   MRSA, PCR NEGATIVE NEGATIVE   Staphylococcus aureus NEGATIVE NEGATIVE    Comment: (NOTE) The Xpert SA Assay (FDA approved for NASAL specimens in patients 36 years of age and older), is  one component of a comprehensive surveillance program. It is not intended to diagnose infection nor to guide or monitor treatment. Performed at [facility], [place] [address], [place], [place] [address]   ABO/Rh     Status: None   Collection Time: 01/16/19  4:02 PM  Result Value Ref Range   ABO/RH(D)      A POS Performed at [facility], [place]  [address] [place], [place] [address]     Estimated body mass index is 33.63 kg/m as calculated from the following:   Height as of 01/16/19: 6' (1.829 m).   Weight as of 01/16/19: 112.5 kg.   Imaging Review Plain radiographs demonstrate severe degenerative joint disease of the right knee(s). The overall alignment ismild varus. The bone quality appears to be fair for age and reported activity level.      Assessment/Plan:  End stage arthritis, right knee   The patient history, physical examination, clinical judgment of the provider and imaging studies are consistent with end stage degenerative joint disease of the right knee(s) and total knee arthroplasty is deemed medically necessary. The treatment options including medical management, injection therapy arthroscopy and arthroplasty were discussed at length. The risks and benefits of total knee arthroplasty were presented and reviewed. The risks due to aseptic loosening, infection, stiffness, patella tracking problems, thromboembolic complications and other imponderables were discussed. The patient acknowledged the explanation, agreed to proceed with the plan and consent was signed. Patient is being admitted for inpatient treatment for surgery, pain control, PT, OT, prophylactic antibiotics, VTE prophylaxis, progressive ambulation and ADL's and discharge planning. The patient is planning to be discharged home with home health services     Patient's anticipated LOS is less than 2 midnights, meeting these requirements: - Younger than 42 - Lives within 1 hour of care - Has a competent adult at home to recover with post-op recover - NO history of  - Chronic pain requiring opiods  - Diabetes  - Coronary Artery Disease  - Heart failure  - Heart attack  - Stroke  - DVT/VTE  - Cardiac arrhythmia  - Respiratory Failure/COPD  - Renal failure  - Anemia  - Advanced Liver disease

## 2019-01-18 NOTE — Progress Notes (Signed)
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  Per Dr. Marin Olp, pt to hold xarelto 2 days prior to surgery and ASA 5 days before surgery.    VS: BP 135/81   Pulse 81   Temp 37.2 C (Oral)   Resp 16   Ht 6' (1.829 m)   Wt 112.5 kg   SpO2 96%   BMI 33.63 kg/m    PROVIDERS: PCP is [name], MD who cleared pt for surgery (see media tab) Hematologist is [name], MD - Rheumatologist is Lorenza Cambridge, MD (notes in care everywhere)  - Nephrologist is [name], MD   LABS: Labs reviewed: Acceptable for surgery.  - Cr 1.77 is consistent with prior results  (all labs ordered are listed, but only abnormal results are displayed)  Labs Reviewed  COMPREHENSIVE METABOLIC PANEL - Abnormal; Notable for the following components:      Result Value   CO2 21 (*)    Creatinine, Ser 1.77 (*)    GFR calc non Af Amer 39 (*)    GFR calc Af Amer 45 (*)    All other components within normal limits  SURGICAL PCR SCREEN  APTT  CBC WITH DIFFERENTIAL/PLATELET  PROTIME-INR  URINALYSIS, ROUTINE W REFLEX MICROSCOPIC  TYPE AND SCREEN  ABO/RH     IMAGES:  CXR 01/16/19: No acute disease.   EKG 01/16/19: NSR  :  Past Medical History:  Diagnosis Date  . Acoustic neuroma (HCC)    benign - left  . Anemia   . Anxiety   . Arthritis   . Asthma    seasonal, when pollen is high, cold air closes me up  . Cancer ([place])    skin cancer -- arm, scalp, upper back   . Chronic kidney disease    sees Dr. [name] at [facility], left kidney is non=functioning.  right kidny is at 50%  . Colon polyps   . Complication of anesthesia   . COPD (chronic obstructive pulmonary disease) ([place])   . Depression    severe.  dx 1985  . DVT (deep venous thrombosis) ([place])    sees Dr. [name]   . GERD (gastroesophageal reflux disease)   . History of kidney stones    has kidney stone now.    . Hypogonadism male    sees Dr. Karsten Ro   . Lupus (systemic lupus erythematosus) (HCC)    sees Dr. Lorenza Cambridge at [facility]   . Plantar fasciitis    rt foot  . PONV (postoperative nausea and vomiting)   . Renal atrophy, left    sees Dr. Heather Roberts at [facility]   . Sleep apnea    tested 2010      Past Surgical History:  Procedure Laterality Date  . CHEST WALL TUMOR EXCISION  2007  . COLONOSCOPY  10/2009   in [place], clear, repeat in 10 yrs   . kidney caluculs  2004-05  . KNEE ARTHROSCOPY  09-10-11   right knee, per Dr. Dorna Leitz   . KNEE ARTHROSCOPY Right 04/25/2017   Procedure: RIGHT KNEE ARTHROSCOPY, PARTIAL LATERAL MENISECTOMY, CHONDROPLASTY MEDIAL AND LATERAL PATELLAOFEMORAL;  Surgeon: Dorna Leitz, MD;  Location: [facility];  Service: Orthopedics;  Laterality: Right;  . madiscus cartilage  2009  . right knee arthroscopy  2008  . TONSILLECTOMY      MEDICATIONS: . acetaminophen (TYLENOL) 500 MG tablet  . albuterol (PROAIR HFA) 108 (90 Base) MCG/ACT inhaler  . aspirin EC 81 MG tablet  . bisacodyl (STIMULANT LAXATIVE) 5 MG EC tablet  . buPROPion (WELLBUTRIN XL) 300 MG 24 hr tablet  . clonazePAM (KLONOPIN) 1 MG tablet  . desvenlafaxine (PRISTIQ) 50 MG 24 hr tablet  . finasteride (PROSCAR) 5 MG tablet  . furosemide (LASIX) 20 MG tablet  . GAMMA AMINOBUTYRIC ACID PO  . hydroxypropyl methylcellulose / hypromellose (ISOPTO TEARS / GONIOVISC) 2.5 % ophthalmic solution  . Melatonin 3 MG TBDP  . Multiple Vitamin (MULTIVITAMIN WITH MINERALS) TABS tablet   . rivaroxaban (XARELTO) 20 MG TABS tablet  . tamsulosin (FLOMAX) 0.4 MG CAPS  . traMADol (ULTRAM) 50 MG tablet   No current facility-administered medications for this encounter.    Derrill Memo ON 01/19/2019] bupivacaine liposome (EXPAREL) 1.3 % injection 266 mg  . [START ON 01/19/2019] vancomycin (VANCOCIN) 1,500 mg in sodium chloride 0.9 % 500 mL IVPB     - Per Dr. Marin Olp, pt to hold xarelto 2 days prior to surgery and ASA 5 days before surgery.    If no changes, I anticipate pt can proceed with surgery as scheduled.   Willeen Cass, FNP-BC [facility] Short Stay Surgical Center/Anesthesiology Phone: [phone] 01/18/2019 11:11 AM

## 2019-01-19 ENCOUNTER — Ambulatory Visit (HOSPITAL_COMMUNITY): Payer: Medicare Other | Admitting: Certified Registered Nurse Anesthetist

## 2019-01-19 ENCOUNTER — Ambulatory Visit (HOSPITAL_COMMUNITY)
Admission: RE | Admit: 2019-01-19 | Discharge: 2019-01-20 | Disposition: A | Discharge status: Home Health | Payer: Medicare Other | Attending: Orthopedic Surgery | Admitting: Orthopedic Surgery

## 2019-01-19 ENCOUNTER — Encounter (HOSPITAL_COMMUNITY): Payer: Self-pay | Admitting: *Deleted

## 2019-01-19 ENCOUNTER — Encounter (HOSPITAL_COMMUNITY)
Admission: RE | Disposition: A | Discharge status: Home Health | Payer: Self-pay | Source: Home / Self Care | Attending: Orthopedic Surgery

## 2019-01-19 ENCOUNTER — Ambulatory Visit (HOSPITAL_COMMUNITY): Payer: Medicare Other | Admitting: Emergency Medicine

## 2019-01-19 ENCOUNTER — Other Ambulatory Visit: Payer: Self-pay

## 2019-01-19 DIAGNOSIS — N261 Atrophy of kidney (terminal): Secondary | ICD-10-CM | POA: Insufficient documentation

## 2019-01-19 DIAGNOSIS — Z7901 Long term (current) use of anticoagulants: Secondary | ICD-10-CM | POA: Insufficient documentation

## 2019-01-19 DIAGNOSIS — Z823 Family history of stroke: Secondary | ICD-10-CM | POA: Insufficient documentation

## 2019-01-19 DIAGNOSIS — Z85828 Personal history of other malignant neoplasm of skin: Secondary | ICD-10-CM | POA: Diagnosis not present

## 2019-01-19 DIAGNOSIS — F329 Major depressive disorder, single episode, unspecified: Secondary | ICD-10-CM | POA: Diagnosis not present

## 2019-01-19 DIAGNOSIS — Z885 Allergy status to narcotic agent status: Secondary | ICD-10-CM | POA: Diagnosis not present

## 2019-01-19 DIAGNOSIS — Z86718 Personal history of other venous thrombosis and embolism: Secondary | ICD-10-CM | POA: Diagnosis not present

## 2019-01-19 DIAGNOSIS — M3214 Glomerular disease in systemic lupus erythematosus: Secondary | ICD-10-CM | POA: Insufficient documentation

## 2019-01-19 DIAGNOSIS — N183 Chronic kidney disease, stage 3 (moderate): Secondary | ICD-10-CM | POA: Insufficient documentation

## 2019-01-19 DIAGNOSIS — M1711 Unilateral primary osteoarthritis, right knee: Secondary | ICD-10-CM | POA: Diagnosis present

## 2019-01-19 DIAGNOSIS — J449 Chronic obstructive pulmonary disease, unspecified: Secondary | ICD-10-CM | POA: Diagnosis not present

## 2019-01-19 DIAGNOSIS — M329 Systemic lupus erythematosus, unspecified: Secondary | ICD-10-CM | POA: Diagnosis not present

## 2019-01-19 DIAGNOSIS — E785 Hyperlipidemia, unspecified: Secondary | ICD-10-CM | POA: Diagnosis not present

## 2019-01-19 DIAGNOSIS — Z7982 Long term (current) use of aspirin: Secondary | ICD-10-CM | POA: Insufficient documentation

## 2019-01-19 DIAGNOSIS — Z880 Allergy status to penicillin: Secondary | ICD-10-CM | POA: Diagnosis not present

## 2019-01-19 DIAGNOSIS — Z882 Allergy status to sulfonamides status: Secondary | ICD-10-CM | POA: Insufficient documentation

## 2019-01-19 DIAGNOSIS — K219 Gastro-esophageal reflux disease without esophagitis: Secondary | ICD-10-CM | POA: Diagnosis not present

## 2019-01-19 DIAGNOSIS — Z888 Allergy status to other drugs, medicaments and biological substances status: Secondary | ICD-10-CM | POA: Insufficient documentation

## 2019-01-19 DIAGNOSIS — Z79899 Other long term (current) drug therapy: Secondary | ICD-10-CM | POA: Diagnosis not present

## 2019-01-19 DIAGNOSIS — N400 Enlarged prostate without lower urinary tract symptoms: Secondary | ICD-10-CM | POA: Diagnosis not present

## 2019-01-19 DIAGNOSIS — Z8249 Family history of ischemic heart disease and other diseases of the circulatory system: Secondary | ICD-10-CM | POA: Insufficient documentation

## 2019-01-19 DIAGNOSIS — J45909 Unspecified asthma, uncomplicated: Secondary | ICD-10-CM | POA: Diagnosis not present

## 2019-01-19 DIAGNOSIS — F418 Other specified anxiety disorders: Secondary | ICD-10-CM | POA: Diagnosis not present

## 2019-01-19 DIAGNOSIS — M2540 Effusion, unspecified joint: Secondary | ICD-10-CM | POA: Insufficient documentation

## 2019-01-19 DIAGNOSIS — Z86010 Personal history of colonic polyps: Secondary | ICD-10-CM | POA: Diagnosis not present

## 2019-01-19 DIAGNOSIS — D631 Anemia in chronic kidney disease: Secondary | ICD-10-CM | POA: Diagnosis not present

## 2019-01-19 DIAGNOSIS — G4730 Sleep apnea, unspecified: Secondary | ICD-10-CM | POA: Diagnosis not present

## 2019-01-19 DIAGNOSIS — Z87442 Personal history of urinary calculi: Secondary | ICD-10-CM | POA: Diagnosis not present

## 2019-01-19 DIAGNOSIS — F419 Anxiety disorder, unspecified: Secondary | ICD-10-CM | POA: Insufficient documentation

## 2019-01-19 HISTORY — PX: TOTAL KNEE ARTHROPLASTY: SHX125

## 2019-01-19 LAB — TYPE AND SCREEN
ABO/RH(D): A POS
Antibody Screen: NEGATIVE

## 2019-01-19 SURGERY — ARTHROPLASTY, KNEE, TOTAL
Anesthesia: Spinal | Site: Knee | Laterality: Right

## 2019-01-19 MED ORDER — DESVENLAFAXINE SUCCINATE ER 50 MG PO TB24
50.0000 mg | ORAL_TABLET | Freq: Every day | ORAL | Status: DC
  Administered 2019-01-20: 10:00:00 50 mg via ORAL
  Filled 2019-01-19: qty 1

## 2019-01-19 MED ORDER — BUPROPION HCL ER (XL) 300 MG PO TB24
300.0000 mg | ORAL_TABLET | Freq: Every day | ORAL | Status: DC
  Administered 2019-01-20: 10:00:00 300 mg via ORAL
  Filled 2019-01-19: qty 1

## 2019-01-19 MED ORDER — DEXAMETHASONE SODIUM PHOSPHATE 10 MG/ML IJ SOLN
10.0000 mg | Freq: Two times a day (BID) | INTRAMUSCULAR | Status: DC
  Administered 2019-01-20: 10:00:00 10 mg via INTRAVENOUS
  Filled 2019-01-19: qty 1

## 2019-01-19 MED ORDER — MIDAZOLAM HCL 2 MG/2ML IJ SOLN
1.0000 mg | INTRAMUSCULAR | Status: DC
  Administered 2019-01-19 (×2): 1 mg via INTRAVENOUS
  Administered 2019-01-19: 09:00:00 2 mg via INTRAVENOUS

## 2019-01-19 MED ORDER — ALBUTEROL SULFATE HFA 108 (90 BASE) MCG/ACT IN AERS: 2.0000 | INHALATION_SPRAY | RESPIRATORY_TRACT | Status: DC | PRN

## 2019-01-19 MED ORDER — CLONAZEPAM 1 MG PO TABS
1.0000 mg | ORAL_TABLET | Freq: Every day | ORAL | Status: DC
  Administered 2019-01-19: 22:00:00 1 mg via ORAL
  Filled 2019-01-19: qty 1

## 2019-01-19 MED ORDER — DOCUSATE SODIUM 100 MG PO CAPS: 100.0000 mg | ORAL_CAPSULE | Freq: Two times a day (BID) | ORAL | 0 refills | Status: DC

## 2019-01-19 MED ORDER — DOCUSATE SODIUM 100 MG PO CAPS
100.0000 mg | ORAL_CAPSULE | Freq: Two times a day (BID) | ORAL | Status: DC
  Administered 2019-01-19 – 2019-01-20 (×2): 100 mg via ORAL
  Filled 2019-01-19 (×2): qty 1

## 2019-01-19 MED ORDER — BUPIVACAINE IN DEXTROSE 0.75-8.25 % IT SOLN
INTRATHECAL | Status: DC | PRN
  Administered 2019-01-19: 1.8 mL via INTRATHECAL

## 2019-01-19 MED ORDER — FENTANYL CITRATE (PF) 100 MCG/2ML IJ SOLN: 25.0000 ug | INTRAMUSCULAR | Status: DC | PRN

## 2019-01-19 MED ORDER — PHENYLEPHRINE 40 MCG/ML (10ML) SYRINGE FOR IV PUSH (FOR BLOOD PRESSURE SUPPORT)
PREFILLED_SYRINGE | INTRAVENOUS | Status: AC
  Filled 2019-01-19: qty 10

## 2019-01-19 MED ORDER — PROPOFOL 500 MG/50ML IV EMUL
INTRAVENOUS | Status: DC | PRN
  Administered 2019-01-19: 100 ug/kg/min via INTRAVENOUS

## 2019-01-19 MED ORDER — BUPIVACAINE-EPINEPHRINE 0.5% -1:200000 IJ SOLN
INTRAMUSCULAR | Status: DC | PRN
  Administered 2019-01-19: 30 mL

## 2019-01-19 MED ORDER — WATER FOR IRRIGATION, STERILE IR SOLN
Status: DC | PRN
  Administered 2019-01-19 (×2): 1000 mL

## 2019-01-19 MED ORDER — PHENYLEPHRINE 40 MCG/ML (10ML) SYRINGE FOR IV PUSH (FOR BLOOD PRESSURE SUPPORT)
PREFILLED_SYRINGE | INTRAVENOUS | Status: DC | PRN
  Administered 2019-01-19 (×5): 80 ug via INTRAVENOUS

## 2019-01-19 MED ORDER — 0.9 % SODIUM CHLORIDE (POUR BTL) OPTIME
TOPICAL | Status: DC | PRN
  Administered 2019-01-19: 11:00:00 1000 mL

## 2019-01-19 MED ORDER — MEPERIDINE HCL 50 MG/ML IJ SOLN: 6.2500 mg | INTRAMUSCULAR | Status: DC | PRN

## 2019-01-19 MED ORDER — BUPIVACAINE LIPOSOME 1.3 % IJ SUSP
INTRAMUSCULAR | Status: DC | PRN
  Administered 2019-01-19: 20 mL

## 2019-01-19 MED ORDER — PROPOFOL 10 MG/ML IV BOLUS
INTRAVENOUS | Status: AC
  Filled 2019-01-19: qty 60

## 2019-01-19 MED ORDER — GABAPENTIN 300 MG PO CAPS
300.0000 mg | ORAL_CAPSULE | Freq: Two times a day (BID) | ORAL | Status: DC
  Administered 2019-01-19 – 2019-01-20 (×3): 300 mg via ORAL
  Filled 2019-01-19 (×3): qty 1

## 2019-01-19 MED ORDER — MIDAZOLAM HCL 2 MG/2ML IJ SOLN
INTRAMUSCULAR | Status: AC
  Filled 2019-01-19: qty 2

## 2019-01-19 MED ORDER — PROPOFOL 10 MG/ML IV BOLUS
INTRAVENOUS | Status: AC
  Filled 2019-01-19: qty 20

## 2019-01-19 MED ORDER — MORPHINE SULFATE (PF) 2 MG/ML IV SOLN: 0.5000 mg | INTRAVENOUS | Status: DC | PRN

## 2019-01-19 MED ORDER — PROPOFOL 10 MG/ML IV BOLUS
INTRAVENOUS | Status: DC | PRN
  Administered 2019-01-19 (×2): 30 mg via INTRAVENOUS

## 2019-01-19 MED ORDER — TIZANIDINE HCL 2 MG PO TABS: 2.0000 mg | ORAL_TABLET | Freq: Three times a day (TID) | ORAL | 0 refills | Status: DC | PRN

## 2019-01-19 MED ORDER — FENTANYL CITRATE (PF) 100 MCG/2ML IJ SOLN
50.0000 ug | INTRAMUSCULAR | Status: DC
  Administered 2019-01-19: 09:00:00 100 ug via INTRAVENOUS

## 2019-01-19 MED ORDER — TRANEXAMIC ACID-NACL 1000-0.7 MG/100ML-% IV SOLN
1000.0000 mg | Freq: Once | INTRAVENOUS | Status: AC
  Administered 2019-01-19: 1000 mg via INTRAVENOUS
  Filled 2019-01-19: qty 100

## 2019-01-19 MED ORDER — SODIUM CHLORIDE 0.9% FLUSH
INTRAVENOUS | Status: DC | PRN
  Administered 2019-01-19: 50 mL

## 2019-01-19 MED ORDER — ALBUTEROL SULFATE (2.5 MG/3ML) 0.083% IN NEBU: 2.5000 mg | INHALATION_SOLUTION | Freq: Four times a day (QID) | RESPIRATORY_TRACT | Status: DC | PRN

## 2019-01-19 MED ORDER — TRANEXAMIC ACID-NACL 1000-0.7 MG/100ML-% IV SOLN
1000.0000 mg | INTRAVENOUS | Status: AC
  Administered 2019-01-19: 10:00:00 1000 mg via INTRAVENOUS
  Filled 2019-01-19: qty 100

## 2019-01-19 MED ORDER — ONDANSETRON HCL 4 MG/2ML IJ SOLN: 4.0000 mg | Freq: Four times a day (QID) | INTRAMUSCULAR | Status: DC | PRN

## 2019-01-19 MED ORDER — TAMSULOSIN HCL 0.4 MG PO CAPS
0.4000 mg | ORAL_CAPSULE | Freq: Every day | ORAL | Status: DC
  Administered 2019-01-19: 0.4 mg via ORAL
  Filled 2019-01-19: qty 1

## 2019-01-19 MED ORDER — DEXAMETHASONE SODIUM PHOSPHATE 10 MG/ML IJ SOLN
INTRAMUSCULAR | Status: DC | PRN
  Administered 2019-01-19: 10 mg via INTRAVENOUS

## 2019-01-19 MED ORDER — ONDANSETRON HCL 4 MG PO TABS: 4.0000 mg | ORAL_TABLET | Freq: Four times a day (QID) | ORAL | Status: DC | PRN

## 2019-01-19 MED ORDER — BUPIVACAINE-EPINEPHRINE (PF) 0.5% -1:200000 IJ SOLN
INTRAMUSCULAR | Status: AC
  Filled 2019-01-19: qty 30

## 2019-01-19 MED ORDER — SODIUM CHLORIDE (PF) 0.9 % IJ SOLN
INTRAMUSCULAR | Status: AC
  Filled 2019-01-19: qty 50

## 2019-01-19 MED ORDER — CHLORHEXIDINE GLUCONATE 4 % EX LIQD: 60.0000 mL | Freq: Once | CUTANEOUS | Status: DC

## 2019-01-19 MED ORDER — SODIUM CHLORIDE 0.9 % IV SOLN
INTRAVENOUS | Status: DC
  Administered 2019-01-19 – 2019-01-20 (×2): via INTRAVENOUS

## 2019-01-19 MED ORDER — OXYCODONE-ACETAMINOPHEN 5-325 MG PO TABS
1.0000 | ORAL_TABLET | ORAL | Status: DC | PRN
  Administered 2019-01-19 (×2): 1 via ORAL
  Administered 2019-01-20 (×2): 2 via ORAL
  Filled 2019-01-19: qty 2
  Filled 2019-01-19 (×2): qty 1
  Filled 2019-01-19: qty 2

## 2019-01-19 MED ORDER — CEFAZOLIN SODIUM-DEXTROSE 2-4 GM/100ML-% IV SOLN: 2.0000 g | Freq: Four times a day (QID) | INTRAVENOUS | Status: DC

## 2019-01-19 MED ORDER — ONDANSETRON HCL 4 MG/2ML IJ SOLN
INTRAMUSCULAR | Status: DC | PRN
  Administered 2019-01-19: 4 mg via INTRAVENOUS

## 2019-01-19 MED ORDER — MAGNESIUM CITRATE PO SOLN: 1.0000 | Freq: Once | ORAL | Status: DC | PRN

## 2019-01-19 MED ORDER — ALUM & MAG HYDROXIDE-SIMETH 200-200-20 MG/5ML PO SUSP: 30.0000 mL | ORAL | Status: DC | PRN

## 2019-01-19 MED ORDER — FINASTERIDE 5 MG PO TABS
5.0000 mg | ORAL_TABLET | ORAL | Status: DC
  Administered 2019-01-20: 5 mg via ORAL
  Filled 2019-01-19: qty 1

## 2019-01-19 MED ORDER — FENTANYL CITRATE (PF) 100 MCG/2ML IJ SOLN
INTRAMUSCULAR | Status: AC
  Administered 2019-01-19: 100 ug via INTRAVENOUS
  Filled 2019-01-19: qty 2

## 2019-01-19 MED ORDER — DIPHENHYDRAMINE HCL 12.5 MG/5ML PO ELIX: 12.5000 mg | ORAL_SOLUTION | ORAL | Status: DC | PRN

## 2019-01-19 MED ORDER — METHOCARBAMOL 500 MG PO TABS
500.0000 mg | ORAL_TABLET | Freq: Four times a day (QID) | ORAL | Status: DC | PRN
  Administered 2019-01-19 – 2019-01-20 (×2): 500 mg via ORAL
  Filled 2019-01-19 (×2): qty 1

## 2019-01-19 MED ORDER — BISACODYL 5 MG PO TBEC: 5.0000 mg | DELAYED_RELEASE_TABLET | Freq: Every day | ORAL | Status: DC | PRN

## 2019-01-19 MED ORDER — POVIDONE-IODINE 10 % EX SWAB
2.0000 "application " | Freq: Once | CUTANEOUS | Status: AC
  Administered 2019-01-19: 2 via TOPICAL

## 2019-01-19 MED ORDER — DEXAMETHASONE SODIUM PHOSPHATE 10 MG/ML IJ SOLN
INTRAMUSCULAR | Status: AC
  Filled 2019-01-19: qty 1

## 2019-01-19 MED ORDER — METHOCARBAMOL 500 MG IVPB - SIMPLE MED
500.0000 mg | Freq: Four times a day (QID) | INTRAVENOUS | Status: DC | PRN
  Filled 2019-01-19: qty 50

## 2019-01-19 MED ORDER — SODIUM CHLORIDE 0.9 % IR SOLN
Status: DC | PRN
  Administered 2019-01-19: 1000 mL

## 2019-01-19 MED ORDER — POLYETHYLENE GLYCOL 3350 17 G PO PACK: 17.0000 g | PACK | Freq: Every day | ORAL | Status: DC | PRN

## 2019-01-19 MED ORDER — OXYCODONE-ACETAMINOPHEN 5-325 MG PO TABS: 1.0000 | ORAL_TABLET | Freq: Four times a day (QID) | ORAL | 0 refills | Status: DC | PRN

## 2019-01-19 MED ORDER — MIDAZOLAM HCL 2 MG/2ML IJ SOLN
INTRAMUSCULAR | Status: AC
  Administered 2019-01-19: 2 mg via INTRAVENOUS
  Filled 2019-01-19: qty 2

## 2019-01-19 MED ORDER — VANCOMYCIN HCL IN DEXTROSE 1-5 GM/200ML-% IV SOLN
1000.0000 mg | Freq: Once | INTRAVENOUS | Status: AC
  Administered 2019-01-19: 1000 mg via INTRAVENOUS
  Filled 2019-01-19: qty 200

## 2019-01-19 MED ORDER — ONDANSETRON HCL 4 MG/2ML IJ SOLN: 4.0000 mg | Freq: Once | INTRAMUSCULAR | Status: DC | PRN

## 2019-01-19 MED ORDER — ASPIRIN EC 81 MG PO TBEC
162.0000 mg | DELAYED_RELEASE_TABLET | Freq: Every day | ORAL | Status: DC
  Administered 2019-01-19 – 2019-01-20 (×2): 162 mg via ORAL
  Filled 2019-01-19 (×2): qty 2

## 2019-01-19 MED ORDER — ONDANSETRON HCL 4 MG/2ML IJ SOLN
INTRAMUSCULAR | Status: AC
  Filled 2019-01-19: qty 2

## 2019-01-19 MED ORDER — LACTATED RINGERS IV SOLN
INTRAVENOUS | Status: DC
  Administered 2019-01-19 (×2): via INTRAVENOUS

## 2019-01-19 SURGICAL SUPPLY — 50 items
ATTUNE MED DOME PAT 41 KNEE (Knees) ×1 IMPLANT
ATTUNE PS FEM RT SZ 6 CEM KNEE (Femur) ×1 IMPLANT
ATTUNE PSRP INSR SZ6 6 KNEE (Insert) ×1 IMPLANT
BAG ZIPLOCK 12X15 (MISCELLANEOUS) ×2 IMPLANT
BASE TIBIAL ROT PLAT SZ 7 KNEE (Knees) IMPLANT
BENZOIN TINCTURE PRP APPL 2/3 (GAUZE/BANDAGES/DRESSINGS) ×2 IMPLANT
BLADE SAGITTAL 25.0X1.19X90 (BLADE) ×2 IMPLANT
BLADE SAW SGTL 11.0X1.19X90.0M (BLADE) ×2 IMPLANT
BLADE SAW SGTL 13.0X1.19X90.0M (BLADE) ×1 IMPLANT
BLADE SURG SZ10 CARB STEEL (BLADE) ×4 IMPLANT
BNDG ELASTIC 6X5.8 VLCR STR LF (GAUZE/BANDAGES/DRESSINGS) ×1 IMPLANT
BOOTIES KNEE HIGH SLOAN (MISCELLANEOUS) ×2 IMPLANT
BOWL SMART MIX CTS (DISPOSABLE) ×2 IMPLANT
CEMENT HV SMART SET (Cement) ×4 IMPLANT
COVER SURGICAL LIGHT HANDLE (MISCELLANEOUS) ×2 IMPLANT
COVER WAND RF STERILE (DRAPES) IMPLANT
CUFF TOURN SGL QUICK 34 (TOURNIQUET CUFF) ×1
CUFF TRNQT CYL 34X4.125X (TOURNIQUET CUFF) ×1 IMPLANT
DECANTER SPIKE VIAL GLASS SM (MISCELLANEOUS) IMPLANT
DRAPE U-SHAPE 47X51 STRL (DRAPES) ×2 IMPLANT
DRSG AQUACEL AG ADV 3.5X10 (GAUZE/BANDAGES/DRESSINGS) ×2 IMPLANT
DURAPREP 26ML APPLICATOR (WOUND CARE) ×2 IMPLANT
ELECT REM PT RETURN 15FT ADLT (MISCELLANEOUS) ×2 IMPLANT
GLOVE BIOGEL PI IND STRL 8 (GLOVE) ×2 IMPLANT
GLOVE BIOGEL PI INDICATOR 8 (GLOVE) ×2
GLOVE ECLIPSE 7.5 STRL STRAW (GLOVE) ×4 IMPLANT
GOWN STRL REUS W/TWL XL LVL3 (GOWN DISPOSABLE) ×4 IMPLANT
HANDPIECE INTERPULSE COAX TIP (DISPOSABLE) ×1
HOLDER FOLEY CATH W/STRAP (MISCELLANEOUS) IMPLANT
HOOD PEEL AWAY FLYTE STAYCOOL (MISCELLANEOUS) ×6 IMPLANT
KIT TURNOVER KIT A (KITS) IMPLANT
MANIFOLD NEPTUNE II (INSTRUMENTS) ×2 IMPLANT
NEEDLE HYPO 22GX1.5 SAFETY (NEEDLE) ×2 IMPLANT
NS IRRIG 1000ML POUR BTL (IV SOLUTION) ×2 IMPLANT
PACK ICE MAXI GEL EZY WRAP (MISCELLANEOUS) ×2 IMPLANT
PACK TOTAL KNEE CUSTOM (KITS) ×2 IMPLANT
PADDING CAST COTTON 6X4 STRL (CAST SUPPLIES) ×2 IMPLANT
PIN STEINMAN FIXATION KNEE (PIN) ×1 IMPLANT
PIN THREADED HEADED SIGMA (PIN) ×1 IMPLANT
PROTECTOR NERVE ULNAR (MISCELLANEOUS) ×2 IMPLANT
SET HNDPC FAN SPRY TIP SCT (DISPOSABLE) ×1 IMPLANT
STRIP CLOSURE SKIN 1/2X4 (GAUZE/BANDAGES/DRESSINGS) IMPLANT
SUT MNCRL AB 3-0 PS2 18 (SUTURE) ×2 IMPLANT
SUT VIC AB 0 CT1 36 (SUTURE) ×2 IMPLANT
SUT VIC AB 1 CT1 36 (SUTURE) ×4 IMPLANT
SYR CONTROL 10ML LL (SYRINGE) ×4 IMPLANT
TIBIAL BASE ROT PLAT SZ 7 KNEE (Knees) ×2 IMPLANT
TRAY FOLEY MTR SLVR 16FR STAT (SET/KITS/TRAYS/PACK) ×2 IMPLANT
WATER STERILE IRR 1000ML POUR (IV SOLUTION) ×4 IMPLANT
YANKAUER SUCT BULB TIP 10FT TU (MISCELLANEOUS) ×2 IMPLANT

## 2019-01-19 NOTE — Interval H&P Note (Signed)
History and Physical Interval Note:  01/19/2019 8:45 AM  Joseph Tran  has presented today for surgery, with the diagnosis of RIGHT KNEE OSTEOARTHRITIS.  The various methods of treatment have been discussed with the patient and family. After consideration of risks, benefits and other options for treatment, the patient has consented to  Procedure(s): RIGHT TOTAL KNEE ARTHROPLASTY (Right) as a surgical intervention.  The patient's history has been reviewed, patient examined, no change in status, stable for surgery.  I have reviewed the patient's chart and labs.  Questions were answered to the patient's satisfaction.     Alta Corning

## 2019-01-19 NOTE — Discharge Instructions (Signed)

## 2019-01-19 NOTE — Transfer of Care (Signed)
Immediate Anesthesia Transfer of Care Note  Patient: [place] Tran  Procedure(s) Performed: RIGHT TOTAL KNEE ARTHROPLASTY (Right Knee)  Patient Location: PACU  Anesthesia Type:Spinal  Level of Consciousness: awake, alert , oriented and patient cooperative  Airway & Oxygen Therapy: Patient Spontanous Breathing and Patient connected to face mask  Post-op Assessment: Report given to RN and Post -op Vital signs reviewed and stable  Post vital signs: Reviewed and stable  Last Vitals:  Vitals Value Taken Time  BP    Temp    Pulse 71 01/19/19 1152  Resp 16 01/19/19 1152  SpO2 96 % 01/19/19 1152  Vitals shown include unvalidated device data.  Last Pain:  Vitals:   01/19/19 0751  TempSrc: Oral         Complications: No apparent anesthesia complications

## 2019-01-19 NOTE — Progress Notes (Signed)
Assisted Dr. Germeroth with right, ultrasound guided, adductor canal block. Side rails up, monitors on throughout procedure. See vital signs in flow sheet. Tolerated Procedure well. 

## 2019-01-19 NOTE — Evaluation (Signed)
Physical Therapy Evaluation Patient Details Name: Joseph Tran MRN: [id number] DOB: Jul 12, 1950 Today's Date: 01/19/2019   History of Present Illness  68 yo male s/p R TKR on 01/19/19. PMH includes CKD, renal atrophy, lupus, concussion, DVT, acoustic neuroma, OSA, anxiety.  Clinical Impression  Pt presents with R knee pain, decreased R knee ROM, difficulty performing bed mobility, increased time and effort to perform mobility tasks, and decreased activity tolerance. Pt to benefit from acute PT to address deficits. Pt ambulated 35 ft with RW with min guard assist, pt limited by pain. PT with verbal cuing for form and safety throughout mobility. Pt educated on ankle pumps (20/hour) to perform this afternoon/evening to increase circulation, to pt's tolerance and limited by pain. PT to progress mobility as tolerated, and will continue to follow acutely.        Follow Up Recommendations Follow surgeon's recommendation for DC plan and follow-up therapies;Supervision for mobility/OOB(HHPT --> OPPT)    Equipment Recommendations  None recommended by PT    Recommendations for Other Services       Precautions / Restrictions Precautions Precautions: Fall Restrictions Weight Bearing Restrictions: No Other Position/Activity Restrictions: WBAT      Mobility  Bed Mobility Overal bed mobility: Needs Assistance Bed Mobility: Supine to Sit     Supine to sit: Min assist;HOB elevated     General bed mobility comments: Assist for RLE lowering once at EOB, increased time to scoot to EOB.  Transfers Overall transfer level: Needs assistance Equipment used: Rolling walker (2 wheeled) Transfers: Sit to/from Stand Sit to Stand: Min guard;From elevated surface         General transfer comment: Min guard for safety. Verbal cuing for hand placement when rising. Pt able to shift weight laterally and AP, slight weakness noted in RLE, PT emphasizing using UEs through RW to minimize RLE  weakness.  Ambulation/Gait Ambulation/Gait assistance: Min guard Gait Distance (Feet): 35 Feet Assistive device: Rolling walker (2 wheeled) Gait Pattern/deviations: Step-to pattern;Decreased step length - right;Decreased step length - left;Decreased weight shift to right;Trunk flexed Gait velocity: decr   General Gait Details: Min guard for safety. Verbal cuing for sequencing, placement in RW, turning with RW. 2 periods of mild weakness in RLE, completely pt corrected and pt steady.  Stairs            Wheelchair Mobility    Modified Rankin (Stroke Patients Only)       Balance Overall balance assessment: Mild deficits observed, not formally tested                                           Pertinent Vitals/Pain Pain Assessment: 0-10 Pain Score: 5  Pain Location: R knee Pain Descriptors / Indicators: Sore;Throbbing Pain Intervention(s): Limited activity within patient's tolerance;Monitored during session;Premedicated before session;Repositioned    Home Living Family/patient expects to be discharged to:: Private residence Living Arrangements: Alone Available Help at Discharge: Family;Available 24 hours/day(brother and sister in law to assist him until follow up appointment) Type of Home: House Home Access: Stairs to enter Entrance Stairs-Rails: None Entrance Stairs-Number of Steps: 1+1 Home Layout: One level Home Equipment: Shower seat;Bedside commode;Walker - 2 wheels;Crutches      Prior Function Level of Independence: Independent               Hand Dominance   Dominant Hand: Right    Extremity/Trunk Assessment  Upper Extremity Assessment Upper Extremity Assessment: Overall WFL for tasks assessed    Lower Extremity Assessment Lower Extremity Assessment: Overall WFL for tasks assessed;RLE deficits/detail RLE Deficits / Details: suspected RLE weakness; able to perform ankle pumps, quad set, heel slide to 60*, SLR without lift assist  and with 10* quad lag RLE Sensation: WNL    Cervical / Trunk Assessment Cervical / Trunk Assessment: Normal  Communication   Communication: No difficulties  Cognition Arousal/Alertness: Awake/alert Behavior During Therapy: WFL for tasks assessed/performed Overall Cognitive Status: Within Functional Limits for tasks assessed                                        General Comments      Exercises     Assessment/Plan    PT Assessment Patient needs continued PT services  PT Problem List Decreased strength;Decreased mobility;Decreased range of motion;Decreased activity tolerance;Decreased balance;Decreased knowledge of use of DME;Pain       PT Treatment Interventions DME instruction;Therapeutic activities;Gait training;Therapeutic exercise;Patient/family education;Balance training;Stair training;Functional mobility training    PT Goals (Current goals can be found in the Care Plan section)  Acute Rehab PT Goals Patient Stated Goal: go home PT Goal Formulation: With patient Time For Goal Achievement: 01/26/19 Potential to Achieve Goals: Good    Frequency 7X/week   Barriers to discharge        Co-evaluation               AM-PAC PT "6 Clicks" Mobility  Outcome Measure Help needed turning from your back to your side while in a flat bed without using bedrails?: A Little Help needed moving from lying on your back to sitting on the side of a flat bed without using bedrails?: A Little Help needed moving to and from a bed to a chair (including a wheelchair)?: A Little Help needed standing up from a chair using your arms (e.g., wheelchair or bedside chair)?: A Little Help needed to walk in hospital room?: A Little Help needed climbing 3-5 steps with a railing? : A Lot 6 Click Score: 17    End of Session Equipment Utilized During Treatment: Gait belt Activity Tolerance: Patient tolerated treatment well;Patient limited by fatigue Patient left: in chair;with  call bell/phone within reach;with chair alarm set;with SCD's reapplied Nurse Communication: Mobility status PT Visit Diagnosis: Other abnormalities of gait and mobility (R26.89);Difficulty in walking, not elsewhere classified (R26.2)    Time: 1630-1700 PT Time Calculation (min) (ACUTE ONLY): 30 min   Charges:   PT Evaluation $PT Eval Low Complexity: 1 Low PT Treatments $Gait Training: 8-22 mins       Julien Girt, PT Acute Rehabilitation Services Pager [phone]  Office [phone]   Brayli Klingbeil D Jayli Fogleman 01/19/2019, 6:00 PM

## 2019-01-19 NOTE — Care Plan (Signed)
Ortho Bundle Case Management Note  Patient Details  Name: Joseph Tran MRN: [id number] Date of Birth: 1951-02-09    Spoke with patient prior to surgery. He will discharge to home with family and HHPT. [facility]erral [facility] to [facility].  Rolling walker ordered for home use. At this point no CPM has been ordered for home due to history of R leg DVT. Will order if needed.  Patient and MD aware of plan and agreeable. Choice offered.                  DME Arranged:  [place] Rile rolling DME Agency:  [employer]  HH Arranged:  PT [place] Agency:  [facility] (formerly [facility])  Additional Comments: Please contact me with any questions of if this plan should need to change.  Ladell Heads,  [place] Specialist  [phone] 01/19/2019, 9:03 AM

## 2019-01-19 NOTE — Anesthesia Procedure Notes (Signed)
Spinal  Patient location during procedure: OR Start time: 01/19/2019 9:56 AM End time: 01/19/2019 9:59 AM Staffing Anesthesiologist: Nolon Nations, MD Performed: anesthesiologist  Preanesthetic Checklist Completed: patient identified, site marked, surgical consent, pre-op evaluation, timeout performed, IV checked, risks and benefits discussed and monitors and equipment checked Spinal Block Patient position: sitting Prep: DuraPrep Patient monitoring: heart rate, continuous pulse ox and blood pressure Approach: right paramedian Location: L3-4 Injection technique: single-shot Needle Needle type: Sprotte  Needle gauge: 24 G Needle length: 9 cm Assessment Sensory level: T4 Additional Notes Expiration date of kit checked and confirmed. Patient tolerated procedure well, without complications.

## 2019-01-19 NOTE — Anesthesia Postprocedure Evaluation (Signed)
Anesthesia Post Note  Patient: Joseph Tran  Procedure(s) Performed: RIGHT TOTAL KNEE ARTHROPLASTY (Right Knee)     Patient location during evaluation: PACU Anesthesia Type: Spinal Level of consciousness: awake and alert Pain management: pain level controlled Vital Signs Assessment: post-procedure vital signs reviewed and stable Respiratory status: spontaneous breathing Cardiovascular status: stable Anesthetic complications: no    Last Vitals:  Vitals:   01/19/19 1533 01/19/19 1618  BP: 137/83 138/85  Pulse: 87 78  Resp: 16 16  Temp:  36.6 C  SpO2: 97% 97%    Last Pain:  Vitals:   01/19/19 1618  TempSrc: Oral  PainSc:                  Nolon Nations

## 2019-01-19 NOTE — Op Note (Addendum)
PATIENT ID:      Joseph Tran  MRN:     [id number] DOB/AGE:    Nov 30, 1950 / 68 y.o.       OPERATIVE REPORT    DATE OF PROCEDURE:  01/19/2019       PREOPERATIVE DIAGNOSIS:   RIGHT KNEE OSTEOARTHRITIS      Estimated body mass index is 33.63 kg/m as calculated from the following:   Height as of 01/16/19: 6' (1.829 m).   Weight as of 01/16/19: 112.5 kg.                                                        POSTOPERATIVE DIAGNOSIS:   RIGHT KNEE OSTEOARTHRITIS                                                                      PROCEDURE:  Procedure(s): RIGHT TOTAL KNEE ARTHROPLASTY Using DepuyAttune RP implants #6 Femur, #7Tibia, 6 mm Attune RP bearing, 41 Patella     SURGEON: Alta Corning    ASSISTANT:   Gaspar Skeeters PA-C   (Present and scrubbed throughout the case, critical for assistance with exposure, retraction, instrumentation, and closure.)         ANESTHESIA: spinal, 20cc Exparel, 50cc 0.25% Marcaine  EBL: minimal cc  FLUID REPLACEMENT: per anesthetic record cc crystaloid  Tourniquet Time: None  Drains: None  Tranexamic Acid: 1gm IV, 2gm topical  Exparel: 266mg    COMPLICATIONS:  None         INDICATIONS FOR PROCEDURE: The patient has  RIGHT KNEE OSTEOARTHRITIS, moderate valgus deformities, XR shows bone on bone arthritis, lateral subluxation of tibia. Patient has failed all conservative measures including anti-inflammatory medicines, narcotics, attempts at exercise and weight loss, cortisone injections and viscosupplementation.  Risks and benefits of surgery have been discussed, questions answered.   DESCRIPTION OF PROCEDURE: The patient identified by armband, received  IV antibiotics, in the holding area at [facility]. Patient taken to the operating room, appropriate anesthetic monitors were attached, and spinal anesthesia was  induced. IV Tranexamic acid was given.Tourniquet applied high to the operative thigh. Lateral post and foot positioner applied to the table, the  lower extremity was then prepped and draped in usual sterile fashion from the toes to the tourniquet. Time-out procedure was performed. The skin and subcutaneous tissue along the incision was injected with 20 cc of a mixture of Exparel and Marcaine solution, using a 20-gauge by 1-1/2 inch needle. We began the operation, with the knee flexed 130 degrees, by making the anterior midline incision starting at handbreadth above the patella going over the patella 1 cm medial to and 4 cm distal to the tibial tubercle. Small bleeders in the skin and the subcutaneous tissue identified and cauterized. Transverse retinaculum was incised and reflected medially and a medial parapatellar arthrotomy was accomplished. the patella was everted and theprepatellar fat pad resected. The superficial medial collateral ligament was then elevated from anterior to posterior along the proximal flare of the tibia and anterior half of the menisci resected. The knee was hyperflexed exposing bone  on bone arthritis. Peripheral and notch osteophytes as well as the cruciate ligaments were then resected. We continued to work our way around posteriorly along the proximal tibia, and externally rotated the tibia subluxing it out from underneath the femur. A McHale retractor was placed through the notch and a lateral Hohmann retractor placed, and we then drilled through the proximal tibia in line with the axis of the tibia followed by an intramedullary guide rod and 2-degree posterior slope cutting guide. The tibial cutting guide, 4 degree posterior sloped, was pinned into place allowing resection of 8 mm of bone medially and 2 mm of bone laterally. Satisfied with the tibial resection, we then entered the distal femur 2 mm anterior to the PCL origin with the intramedullary guide rod and applied the distal femoral cutting guide set at 9 mm, with 5 degrees of valgus. This was pinned along the epicondylar axis. At this point, the distal femoral cut was  accomplished without difficulty. We then sized for a #6 femoral component and pinned the guide in 0 degrees of external rotation. The chamfer cutting guide was pinned into place. The anterior, posterior, and chamfer cuts were accomplished without difficulty followed by the Attune RP box cutting guide and the box cut. We also removed posterior osteophytes from the posterior femoral condyles. The posterior capsule was injected with Exparel solution. The knee was brought into full extension. We checked our extension gap and fit a 6 mm bearing. Distracting in extension with a lamina spreader,  bleeders in the posterior capsule, Posterior medial and posterior lateral right down and cauterized.  The transexamic acid-soaked sponge was then placed in the gap of the knee and extension. The knee was flexed 30. The posterior patella cut was accomplished with the 9.5 mm Attune cutting guide, sized for a 48mm dome, and the fixation pegs drilled.The knee was then once again hyperflexed exposing the proximal tibia. We sized for a # 7 tibial base plate, applied the smokestack and the conical reamer followed by the the Delta fin keel punch. We then hammered into place the Attune RP trial femoral component, drilled the lugs, inserted a  6 mm trial bearing, trial patellar button, and took the knee through range of motion from 0-130 degrees. Medial and lateral ligamentous stability was checked. No thumb pressure was required for patellar Tracking. The tourniquet was less than 1 hr . All trial components were removed, mating surfaces irrigated with pulse lavage, and dried with suction and sponges. 10 cc of the Exparel solution was applied to the cancellus bone of the patella distal femur and proximal tibia.  After waiting 30 seconds, the bony surfaces were again, dried with sponges. A double batch of DePuy HV cement was mixed and applied to all bony metallic mating surfaces except for the posterior condyles of the femur itself. In  order, we hammered into place the tibial tray and removed excess cement, the femoral component and removed excess cement. The final Attune RP bearing was inserted, and the knee brought to full extension with compression. The patellar button was clamped into place, and excess cement removed. The knee was held at 30 flexion with compression, while the cement cured. The wound was irrigated out with normal saline solution pulse lavage. The rest of the Exparel was injected into the parapatellar arthrotomy, subcutaneous tissues, and periosteal tissues. The parapatellar arthrotomy was closed with running #1 Vicryl suture. The subcutaneous tissue with 0 and 2-0 undyed Vicryl suture, and the skin with running 3-0 SQ vicryl.  An Aquacil and Ace wrap were applied. The patient was taken to recovery room without difficulty.   Alta Corning 01/19/2019, 3:36 PM

## 2019-01-20 DIAGNOSIS — N183 Chronic kidney disease, stage 3 (moderate): Secondary | ICD-10-CM | POA: Diagnosis not present

## 2019-01-20 DIAGNOSIS — M1711 Unilateral primary osteoarthritis, right knee: Secondary | ICD-10-CM | POA: Diagnosis not present

## 2019-01-20 DIAGNOSIS — E785 Hyperlipidemia, unspecified: Secondary | ICD-10-CM | POA: Diagnosis not present

## 2019-01-20 DIAGNOSIS — N261 Atrophy of kidney (terminal): Secondary | ICD-10-CM | POA: Diagnosis not present

## 2019-01-20 DIAGNOSIS — M3214 Glomerular disease in systemic lupus erythematosus: Secondary | ICD-10-CM | POA: Diagnosis not present

## 2019-01-20 DIAGNOSIS — M329 Systemic lupus erythematosus, unspecified: Secondary | ICD-10-CM | POA: Diagnosis not present

## 2019-01-20 LAB — CBC
HCT: 39.1 % (ref 39.0–52.0)
Hemoglobin: 12.7 g/dL — ABNORMAL LOW (ref 13.0–17.0)
MCH: 31.4 pg (ref 26.0–34.0)
MCHC: 32.5 g/dL (ref 30.0–36.0)
MCV: 96.5 fL (ref 80.0–100.0)
Platelets: 171 10*3/uL (ref 150–400)
RBC: 4.05 MIL/uL — ABNORMAL LOW (ref 4.22–5.81)
RDW: 11.7 % (ref 11.5–15.5)
WBC: 12.8 10*3/uL — ABNORMAL HIGH (ref 4.0–10.5)
nRBC: 0 % (ref 0.0–0.2)

## 2019-01-20 NOTE — Progress Notes (Signed)
   PATIENT ID: Joseph Tran   1 Day Post-Op Procedure(s) (LRB): RIGHT TOTAL KNEE ARTHROPLASTY (Right)  Subjective: Doing well this am. Pain around tourniquet site but improving with oxycodone. Blood in urine this am but improving, likely from catheter.   Objective:  Vitals:   01/20/19 0005 01/20/19 0453  BP: 133/82 135/77  Pulse: 84 75  Resp: 16 20  Temp: 98.2 F (36.8 C) 98.2 F (36.8 C)  SpO2: 98% 99%     R knee dressing c/d/i  Wiggles toes, distally NVI Thigh skin benign  Labs:  Recent Labs    01/20/19 0415  HGB 12.7*   Recent Labs    01/20/19 0415  WBC 12.8*  RBC 4.05*  HCT 39.1  PLT 171  No results for input(s): NA, K, CL, CO2, BUN, CREATININE, GLUCOSE, CALCIUM in the last 72 hours.  Assessment and Plan: 1 day s/p T TKA Up with PT CPM ordered for inpatient, he has an order for home, 0-60 deg flex Monitor urine for improvement prior to d/c D/c orders in chart, okay to dc when cleared by PT  VTE proph: asa, scds

## 2019-01-20 NOTE — TOC Initial Note (Signed)
Transition of Care [facility]) - Initial/Assessment Note    Patient Details  Name: Joseph Tran MRN: [id number] Date of Birth: 1950-10-19  Transition of Care (TOC) CM/SW Contact:    Joaquin Courts, RN Phone Number: 01/20/2019, 12:15 PM  Clinical Narrative:   CM spoke with patient at bedside. Patient [facility] with [facility] [place]. Patient reports he has rolling walker and 3-in-1 at home.                 Expected Discharge Plan: [place] Barriers to Discharge: No Barriers Identified   Patient Goals and CMS Choice Patient states their goals for this hospitalization and ongoing recovery are:: to go home CMS Medicare.gov Compare Post Acute Care list provided to:: Patient    Expected Discharge Plan and Services Expected Discharge Plan: [place]   Discharge Planning Services: CM Consult   Living arrangements for the past 2 months: Single Family Home Expected Discharge Date: 01/20/19               DME Arranged: N/A DME Agency: NA       HH Arranged: PT [place] Agency: [facility] (formerly [employer]) Date [place]: 01/20/19 Time [place]: 1215 Representative spoke with at [place]: pre arranged in MD office  Prior Living Arrangements/Services Living arrangements for the past 2 months: [place] with:: Self Patient language and need for interpreter reviewed:: Yes Do you feel safe going back to the place where you live?: Yes      Need for Family Participation in Patient Care: Yes (Comment) Care giver support system in place?: Yes (comment)   Criminal Activity/Legal Involvement Pertinent to Current Situation/Hospitalization: No - Comment as needed  Activities of Daily Living Home Assistive Devices/Equipment: Crutches, Cane (specify quad or straight), Eyeglasses, Long-handled shoehorn, Walker (specify type), Bedside commode/3-in-1, [place] ADL Screening (condition at time of  admission) Patient's cognitive ability adequate to safely complete daily activities?: Yes Is the patient deaf or have difficulty hearing?: Yes Does the patient have difficulty seeing, even when wearing glasses/contacts?: No Does the patient have difficulty concentrating, remembering, or making decisions?: No Patient able to express need for assistance with ADLs?: Yes Does the patient have difficulty dressing or bathing?: No Independently performs ADLs?: Yes (appropriate for developmental age) Does the patient have difficulty walking or climbing stairs?: Yes Weakness of Legs: Right Weakness of Arms/Hands: None  Permission Sought/Granted                  Emotional Assessment Appearance:: Appears stated age Attitude/Demeanor/Rapport: Engaged Affect (typically observed): Accepting Orientation: : Oriented to Place, Oriented to  Time, Oriented to Situation, Oriented to Self   Psych Involvement: No (comment)  Admission diagnosis:  RIGHT TOTAL KNEE ARTHROPLASTY Patient Active Problem List   Diagnosis Date Noted  . Primary osteoarthritis of right knee 01/19/2019  . CKD (chronic kidney disease), stage III ([place]) 09/13/2017  . Renal atrophy, left 09/13/2017  . Systemic lupus erythematosus ([place]) 09/13/2017  . Dyslipidemia 09/13/2017  . Acute meniscal tear, lateral, right, initial encounter 04/25/2017  . Osteoarthritis of right knee 04/25/2017  . Concussion with loss of consciousness 08/05/2015  . Laceration of spleen 08/05/2015  . Lumbar stress fracture 08/05/2015  . DVT (deep venous thrombosis) ([place]) 10/18/2014  . Hx of bacterial pneumonia 03/10/2013  . COLONIC POLYPS, HX OF 04/08/2010  . NEPHROLITHIASIS, HX OF 04/08/2010  . ACOUSTIC NEUROMA 04/07/2010  . Hypogonadism male 04/07/2010  .  Depression with anxiety 04/07/2010  . SLEEP APNEA, OBSTRUCTIVE 04/07/2010  . ASTHMA 04/07/2010  . BENIGN PROSTATIC HYPERTROPHY, WITH OBSTRUCTION 04/07/2010  . ERECTILE DYSFUNCTION, ORGANIC  04/07/2010  . PLANTAR FASCIITIS 04/07/2010   PCP:  [name], MD Pharmacy:   [facility] [place] [address] [place] [address] Phone: [phone] Fax: [id number] - [place], [place] - [address] AT [place] [address] [place] [address] Phone: [phone] Fax: [phone]     Social Determinants of Health (SDOH) Interventions    Readmission Risk Interventions No flowsheet data found.

## 2019-01-20 NOTE — Progress Notes (Signed)
Home health agencies that serve [phone].        Grand Quality of Patient Care Rating Patient Survey Summary Rating  [facility] [phone] 3  out of 5 stars 4 out of [place] [phone] 3 out of 5 stars 4 out of [place] [phone] 4 out of 5 stars 4 out of [place] [phone] 4 out of 5 stars 4 out of [place] [phone] 4 out of 5 stars 4 out of 5 stars  [place] [phone] 4  out of 5 stars 4 out of 5 stars  [place] [phone] 4 out of 5 stars 4 out of [place] [phone] 4 out of 5 stars 4 out of [place] [phone] 3  out of 5 stars 4 out of [place] [phone] 3 out of 5 stars 4 out of [place] [phone] 2  out of 5 stars 3 out of 5 stars  [facility] [phone] 4 out of 5 stars Not [place] [phone] 3 out of 5 stars 4 out of 5 stars  [facility] [phone] 3  out of 5 stars 3 out of 5 stars  [facility] [phone] 3  out of 5 stars 4 out of 5 stars  [facility] [phone] 3  out of 5 stars Not [place] [phone] 3  out of 5 stars 4 out of [place] [phone] 3  out of 5 stars 3 out of [place] [phone] 3  out of 5 stars Not [place] [phone] 3 out of 5 stars 3 out of [place] [phone] 3  out of 5 stars 4 out of [place] [phone] 4  out of 5 stars 3 out  of [place] number Footnote as displayed on [place]  1 This agency provides services under a federal waiver program to non-traditional, chronic long term population.  2 This agency provides services to a special needs population.  3 Not Available.  4 The number of patient episodes for this measure is too small to report.  5 This measure currently does not have data or provider has been certified/recertified for less than 6 months.  6 The national average for this measure is not provided because of state-to-state differences in data collection.  7 Medicare is not displaying rates for this measure for any home health agency, because of an issue with the data.  8 There were problems with the data and they are being corrected.  9 Zero, or very few, patients met the survey's rules for inclusion. The scores shown, if any, reflect a very small number of surveys and may not accurately tell how an agency is doing.  10 Survey results are based on less than 12 months  of data.  11 Fewer than 70 patients completed the survey. Use the scores shown, if any, with caution as the number of surveys may be too low to accurately tell how an agency is doing.  12 No survey results are available for this period.  13 Data suppressed by CMS for one or more quarters.

## 2019-01-20 NOTE — Progress Notes (Signed)
Physical Therapy Treatment Patient Details Name: Joseph Tran MRN: [id number] DOB: [birth year] Today's Date: 01/20/2019    History of Present Illness 68 yo male s/p R TKR on 01/19/19. PMH includes CKD, renal atrophy, lupus, concussion, DVT, acoustic neuroma, OSA, anxiety.    PT Comments    VIRGLE ARTH is 68 y.o. male POD 1 s/p Rt TKR. He demonstrated improved mobility this date and was independent with Rt LE mobility to perform supine<>sit and sit<>stand transfers. Patient ambulated 150' feet this session with RW and supervision requiring minimal cues for posture and heel to toe gait pattern. He was instructed in reverse step up technique with step negotiation to simulate porch and front door steps at home. Patient performed stair mobility with supervision and demonstrated good balance to lift RW up to stair platform. He was instructed on HEP for knee ROM and edema management and was instructed to perform supine exercises at EOS when resting in bed. Patient demonstrated understanding of quad set, ankle pumps and heel slide with greatest difficulty with heel slide. He will benefit from additional session to review HEP and ensure competence prior to discharge home.     Follow Up Recommendations  Follow surgeon's recommendation for DC plan and follow-up therapies;Supervision for mobility/OOB(HHPT --> OPPT)     Equipment Recommendations  None recommended by PT    Recommendations for Other Services       Precautions / Restrictions Precautions Precautions: Fall Restrictions Weight Bearing Restrictions: No    Mobility  Bed Mobility Overal bed mobility: Needs Assistance Bed Mobility: Supine to Sit     Supine to sit: HOB elevated;Supervision     General bed mobility comments: pt with improved independence in supine<>sit tranfers, no assist required for Rt LE mobility  Transfers Overall transfer level: Needs assistance Equipment used: Rolling walker (2 wheeled) Transfers: Sit  to/from Stand Sit to Stand: From elevated surface;Supervision         General transfer comment: verbal cues for hand position and sequencing with RW use  Ambulation/Gait Ambulation/Gait assistance: Supervision Gait Distance (Feet): 150 Feet(2 bouts of 75 feet) Assistive device: Rolling walker (2 wheeled) Gait Pattern/deviations: Step-to pattern;Decreased step length - right;Decreased step length - left;Decreased weight shift to right;Decreased stance time - right;Antalgic     General Gait Details: pt with improved upright posture this session and appropriate hand placement on RW, supervision for safety   Stairs Stairs: Yes Stairs assistance: Supervision Stair Management: Backwards;With walker Number of Stairs: 2 General stair comments: pt instructed on reverse step up technique for single step/landing negotiation; performed 2x with RW and demonstrated good balance when bringing walker up to platform   Wheelchair Mobility    Modified Rankin (Stroke Patients Only)       Balance   Sitting-balance support: No upper extremity supported;Feet supported Sitting balance-Leahy Scale: Normal       Standing balance-Leahy Scale: Fair Standing balance comment: pt requires UE support with RW to maintain balance during gait         Cognition Arousal/Alertness: Awake/alert Behavior During Therapy: WFL for tasks assessed/performed Overall Cognitive Status: Within Functional Limits for tasks assessed               Exercises Total Joint Exercises Ankle Circles/Pumps: 10 reps;Right;Left;Supine Quad Sets: AROM;Right;5 reps;Other (comment)(5 sec holds) Heel Slides: AROM;Right;Seated;5 reps Long Arc Quad: AROM;5 reps;Right;Seated        Pertinent Vitals/Pain Pain Assessment: 0-10 Pain Score: 7  Pain Location: R knee Pain Descriptors / Indicators: Sore;Throbbing Pain  Intervention(s): Limited activity within patient's tolerance;Monitored during session;Patient requesting  pain meds-RN notified;Ice applied           PT Goals (current goals can now be found in the care plan section) Acute Rehab PT Goals Patient Stated Goal: go home PT Goal Formulation: With patient Time For Goal Achievement: 01/26/19 Potential to Achieve Goals: Good Progress towards PT goals: Progressing toward goals    Frequency    7X/week(BID)      PT Plan Current plan remains appropriate       AM-PAC PT "6 Clicks" Mobility   Outcome Measure  Help needed turning from your back to your side while in a flat bed without using bedrails?: A Little Help needed moving from lying on your back to sitting on the side of a flat bed without using bedrails?: A Little Help needed moving to and from a bed to a chair (including a wheelchair)?: A Little Help needed standing up from a chair using your arms (e.g., wheelchair or bedside chair)?: A Little Help needed to walk in hospital room?: A Little Help needed climbing 3-5 steps with a railing? : A Little 6 Click Score: 18    End of Session Equipment Utilized During Treatment: Gait belt Activity Tolerance: Patient tolerated treatment well;Patient limited by fatigue Patient left: with call bell/phone within reach;with SCD's reapplied;in bed;with bed alarm set Nurse Communication: Mobility status PT Visit Diagnosis: Other abnormalities of gait and mobility (R26.89);Difficulty in walking, not elsewhere classified (R26.2)     Time: 8916-9450 PT Time Calculation (min) (ACUTE ONLY): 42 min  Charges:  $Gait Training: 23-37 mins $Therapeutic Exercise: 8-22 mins                     Kipp Brood, PT, DPT, [facility] Physical Therapist with [facility]  01/20/2019 12:29 PM

## 2019-01-20 NOTE — Progress Notes (Signed)
Physical Therapy Treatment Patient Details Name: Joseph Tran MRN: [id number] DOB: 21-Oct-1950 Today's Date: 01/20/2019    History of Present Illness 68 yo male s/p R TKR on 01/19/19. PMH includes CKD, renal atrophy, lupus, concussion, DVT, acoustic neuroma, OSA, anxiety.    PT Comments     Patient seen for BID session this date. Patient reported significant increase in soreness and knee pain following AM session therefore afternoon focused on HEP. Patient educated on exercises in packet for daily use and demonstrated good ability to perform quad contraction with quad set, SAQ, and maintained knee extension with SLR. He had greatest difficulty with heel slides but improved with tactile cuing. Patient demonstrated safe ambulation and stair negotiation during AM session and has demonstrated competence with HEP. Anticipate that once medically stable he will be safe to discharge home and initiate follow-up therapy per surgeon's recommendation.    Follow Up Recommendations  Follow surgeon's recommendation for DC plan and follow-up therapies;Supervision for mobility/OOB(HHPT --> OPPT)     Equipment Recommendations  None recommended by PT    Recommendations for Other Services       Precautions / Restrictions Precautions Precautions: Fall Restrictions Weight Bearing Restrictions: No    Mobility    Balance   Cognition Arousal/Alertness: Awake/alert Behavior During Therapy: WFL for tasks assessed/performed Overall Cognitive Status: Within Functional Limits for tasks assessed           Exercises Total Joint Exercises Ankle Circles/Pumps: 10 reps;Right;Left;Supine Quad Sets: AROM;Right;Other (comment);10 reps;Supine(5 sec holds) Short Arc Quad: 10 reps;Right;Supine Heel Slides: AROM;Right;Supine;10 reps Hip ABduction/ADduction: AROM;Supine;10 reps;Right Straight Leg Raises: 5 reps;Supine;Right;AROM Long Arc Quad: AROM;5 reps;Right;Seated        Pertinent Vitals/Pain Pain  Assessment: 0-10 Pain Score: 3  Pain Location: R knee Pain Descriptors / Indicators: Sore;Throbbing Pain Intervention(s): Limited activity within patient's tolerance;Monitored during session           PT Goals (current goals can now be found in the care plan section) Acute Rehab PT Goals Patient Stated Goal: go home PT Goal Formulation: With patient Time For Goal Achievement: 01/26/19 Potential to Achieve Goals: Good Progress towards PT goals: Progressing toward goals    Frequency    7X/week(BID)      PT Plan Current plan remains appropriate       AM-PAC PT "6 Clicks" Mobility   Outcome Measure  Help needed turning from your back to your side while in a flat bed without using bedrails?: A Little Help needed moving from lying on your back to sitting on the side of a flat bed without using bedrails?: A Little Help needed moving to and from a bed to a chair (including a wheelchair)?: A Little Help needed standing up from a chair using your arms (e.g., wheelchair or bedside chair)?: A Little Help needed to walk in hospital room?: A Little Help needed climbing 3-5 steps with a railing? : A Little 6 Click Score: 18    End of Session Equipment Utilized During Treatment: Gait belt Activity Tolerance: Patient tolerated treatment well Patient left: with call bell/phone within reach;with SCD's reapplied;in bed;with bed alarm set Nurse Communication: Mobility status PT Visit Diagnosis: Other abnormalities of gait and mobility (R26.89);Difficulty in walking, not elsewhere classified (R26.2)     Time: 9628-3662 PT Time Calculation (min) (ACUTE ONLY): 24 min  Charges:  $Gait Training: 23-37 mins $Therapeutic Exercise: 23-37 mins  Kipp Brood, PT, DPT, [facility] Physical Therapist with [facility]  01/20/2019 2:13 PM

## 2019-01-20 NOTE — Plan of Care (Signed)
  Problem: Acute Rehab PT Goals(only PT should resolve) Goal: Pt Will Go Supine/Side To Sit Outcome: Progressing Goal: Patient Will Transfer Sit To/From Stand Outcome: Progressing Goal: Pt Will Ambulate Outcome: Progressing Goal: Pt Will Go Up/Down Stairs Outcome: Progressing Goal: Pt/caregiver will Perform Home Exercise Program Outcome: Progressing

## 2019-01-21 DIAGNOSIS — Z86010 Personal history of colonic polyps: Secondary | ICD-10-CM | POA: Diagnosis not present

## 2019-01-21 DIAGNOSIS — M1991 Primary osteoarthritis, unspecified site: Secondary | ICD-10-CM | POA: Diagnosis not present

## 2019-01-21 DIAGNOSIS — Z96651 Presence of right artificial knee joint: Secondary | ICD-10-CM | POA: Diagnosis not present

## 2019-01-21 DIAGNOSIS — I129 Hypertensive chronic kidney disease with stage 1 through stage 4 chronic kidney disease, or unspecified chronic kidney disease: Secondary | ICD-10-CM | POA: Diagnosis not present

## 2019-01-21 DIAGNOSIS — M329 Systemic lupus erythematosus, unspecified: Secondary | ICD-10-CM | POA: Diagnosis not present

## 2019-01-21 DIAGNOSIS — Z9181 History of falling: Secondary | ICD-10-CM | POA: Diagnosis not present

## 2019-01-21 DIAGNOSIS — E785 Hyperlipidemia, unspecified: Secondary | ICD-10-CM | POA: Diagnosis not present

## 2019-01-21 DIAGNOSIS — Z86718 Personal history of other venous thrombosis and embolism: Secondary | ICD-10-CM | POA: Diagnosis not present

## 2019-01-21 DIAGNOSIS — D631 Anemia in chronic kidney disease: Secondary | ICD-10-CM | POA: Diagnosis not present

## 2019-01-21 DIAGNOSIS — Z471 Aftercare following joint replacement surgery: Secondary | ICD-10-CM | POA: Diagnosis not present

## 2019-01-21 DIAGNOSIS — Z85828 Personal history of other malignant neoplasm of skin: Secondary | ICD-10-CM | POA: Diagnosis not present

## 2019-01-21 DIAGNOSIS — N183 Chronic kidney disease, stage 3 (moderate): Secondary | ICD-10-CM | POA: Diagnosis not present

## 2019-01-21 DIAGNOSIS — F419 Anxiety disorder, unspecified: Secondary | ICD-10-CM | POA: Diagnosis not present

## 2019-01-21 DIAGNOSIS — Z7901 Long term (current) use of anticoagulants: Secondary | ICD-10-CM | POA: Diagnosis not present

## 2019-01-21 DIAGNOSIS — K219 Gastro-esophageal reflux disease without esophagitis: Secondary | ICD-10-CM | POA: Diagnosis not present

## 2019-01-21 DIAGNOSIS — J449 Chronic obstructive pulmonary disease, unspecified: Secondary | ICD-10-CM | POA: Diagnosis not present

## 2019-01-21 DIAGNOSIS — N400 Enlarged prostate without lower urinary tract symptoms: Secondary | ICD-10-CM | POA: Diagnosis not present

## 2019-01-23 ENCOUNTER — Encounter (HOSPITAL_COMMUNITY): Payer: Self-pay | Admitting: Orthopedic Surgery

## 2019-01-24 DIAGNOSIS — E785 Hyperlipidemia, unspecified: Secondary | ICD-10-CM | POA: Diagnosis not present

## 2019-01-24 DIAGNOSIS — Z471 Aftercare following joint replacement surgery: Secondary | ICD-10-CM | POA: Diagnosis not present

## 2019-01-24 DIAGNOSIS — D631 Anemia in chronic kidney disease: Secondary | ICD-10-CM | POA: Diagnosis not present

## 2019-01-24 DIAGNOSIS — I129 Hypertensive chronic kidney disease with stage 1 through stage 4 chronic kidney disease, or unspecified chronic kidney disease: Secondary | ICD-10-CM | POA: Diagnosis not present

## 2019-01-24 DIAGNOSIS — J449 Chronic obstructive pulmonary disease, unspecified: Secondary | ICD-10-CM | POA: Diagnosis not present

## 2019-01-24 DIAGNOSIS — N183 Chronic kidney disease, stage 3 (moderate): Secondary | ICD-10-CM | POA: Diagnosis not present

## 2019-01-25 DIAGNOSIS — I129 Hypertensive chronic kidney disease with stage 1 through stage 4 chronic kidney disease, or unspecified chronic kidney disease: Secondary | ICD-10-CM | POA: Diagnosis not present

## 2019-01-25 DIAGNOSIS — N183 Chronic kidney disease, stage 3 (moderate): Secondary | ICD-10-CM | POA: Diagnosis not present

## 2019-01-25 DIAGNOSIS — E785 Hyperlipidemia, unspecified: Secondary | ICD-10-CM | POA: Diagnosis not present

## 2019-01-25 DIAGNOSIS — Z471 Aftercare following joint replacement surgery: Secondary | ICD-10-CM | POA: Diagnosis not present

## 2019-01-25 DIAGNOSIS — J449 Chronic obstructive pulmonary disease, unspecified: Secondary | ICD-10-CM | POA: Diagnosis not present

## 2019-01-25 DIAGNOSIS — D631 Anemia in chronic kidney disease: Secondary | ICD-10-CM | POA: Diagnosis not present

## 2019-01-26 DIAGNOSIS — Z471 Aftercare following joint replacement surgery: Secondary | ICD-10-CM | POA: Diagnosis not present

## 2019-01-26 DIAGNOSIS — D631 Anemia in chronic kidney disease: Secondary | ICD-10-CM | POA: Diagnosis not present

## 2019-01-26 DIAGNOSIS — E785 Hyperlipidemia, unspecified: Secondary | ICD-10-CM | POA: Diagnosis not present

## 2019-01-26 DIAGNOSIS — J449 Chronic obstructive pulmonary disease, unspecified: Secondary | ICD-10-CM | POA: Diagnosis not present

## 2019-01-26 DIAGNOSIS — I129 Hypertensive chronic kidney disease with stage 1 through stage 4 chronic kidney disease, or unspecified chronic kidney disease: Secondary | ICD-10-CM | POA: Diagnosis not present

## 2019-01-26 DIAGNOSIS — N183 Chronic kidney disease, stage 3 (moderate): Secondary | ICD-10-CM | POA: Diagnosis not present

## 2019-01-29 DIAGNOSIS — M25561 Pain in right knee: Secondary | ICD-10-CM | POA: Diagnosis not present

## 2019-01-29 DIAGNOSIS — Z471 Aftercare following joint replacement surgery: Secondary | ICD-10-CM | POA: Diagnosis not present

## 2019-01-30 ENCOUNTER — Other Ambulatory Visit: Payer: Self-pay

## 2019-01-30 ENCOUNTER — Ambulatory Visit: Payer: Medicare Other | Attending: Orthopedic Surgery | Admitting: Physical Therapy

## 2019-01-30 ENCOUNTER — Encounter: Payer: Self-pay | Admitting: Physical Therapy

## 2019-01-30 DIAGNOSIS — M25561 Pain in right knee: Secondary | ICD-10-CM

## 2019-01-30 DIAGNOSIS — M6281 Muscle weakness (generalized): Secondary | ICD-10-CM | POA: Diagnosis not present

## 2019-01-30 DIAGNOSIS — R262 Difficulty in walking, not elsewhere classified: Secondary | ICD-10-CM | POA: Diagnosis not present

## 2019-01-30 DIAGNOSIS — M25661 Stiffness of right knee, not elsewhere classified: Secondary | ICD-10-CM | POA: Diagnosis not present

## 2019-01-30 NOTE — Therapy (Signed)
[facility] [address]  [place], [place], [address] Phone: [phone]   Fax:  [phone]  Physical Therapy Evaluation  Patient Details  Name: Joseph Tran MRN: [id number] Date of Birth: [birth year] Referring Provider (PT): Dorna Leitz, MD   Encounter Date: 01/30/2019  PT End of Session - 01/30/19 1557    Visit Number  1    Number of Visits  13    Date for PT Re-Evaluation  03/13/19    Authorization Type  Medicare & AARP    PT Start Time  1307    PT Stop Time  1357    PT Time Calculation (min)  50 min    Activity Tolerance  Patient tolerated treatment well;Patient limited by pain    Behavior During Therapy  [facility] for tasks assessed/performed       Past Medical History:  Diagnosis Date  . Acoustic neuroma (HCC)    benign - left  . Anemia   . Anxiety   . Arthritis   . Asthma    seasonal, when pollen is high, cold air closes me up  . Cancer ([place])    skin cancer -- arm, scalp, upper back  . Chronic kidney disease    sees Dr. [name] at [facility], left kidney is non=functioning.  right kidny is at 50%  . Colon polyps   . Complication of anesthesia   . COPD (chronic obstructive pulmonary disease) ([place])   . Depression    severe.  dx 1985  . DVT (deep venous thrombosis) ([place])    sees Dr. [name]   . GERD (gastroesophageal reflux disease)   . History of kidney stones    has kidney stone now.    . Hypogonadism male    sees Dr. Karsten Ro   . Lupus (systemic lupus erythematosus) (HCC)    sees Dr. Lorenza Cambridge at [facility]   . Plantar fasciitis    rt foot  . PONV (postoperative nausea and vomiting)   . Renal atrophy, left    sees Dr. Heather Roberts at [facility]   . Sleep apnea    tested 2010      Past Surgical History:  Procedure Laterality Date  . CHEST WALL TUMOR EXCISION  2007  . COLONOSCOPY  10/2009   in [place], clear, repeat in 10 yrs   . kidney caluculs  2004-05  . KNEE  ARTHROSCOPY  09-10-11   right knee, per Dr. Dorna Leitz   . KNEE ARTHROSCOPY Right 04/25/2017   Procedure: RIGHT KNEE ARTHROSCOPY, PARTIAL LATERAL MENISECTOMY, CHONDROPLASTY MEDIAL AND LATERAL PATELLAOFEMORAL;  Surgeon: Dorna Leitz, MD;  Location: [facility];  Service: Orthopedics;  Laterality: Right;  . madiscus cartilage  2009  . right knee arthroscopy  2008  . TONSILLECTOMY    . TOTAL KNEE ARTHROPLASTY Right 01/19/2019   Procedure: RIGHT TOTAL KNEE ARTHROPLASTY;  Surgeon: Dorna Leitz, MD;  Location: [facility];  Service: Orthopedics;  Laterality: Right;    There were no vitals filed for this visit.   Subjective Assessment - 01/30/19 1311    Subjective  Patient reports undergoing R TKA on 01/19/19. Pain levels most prominent in medial knee, radiating into groin. 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 Currently being treated with blood thinners. Denies pain in groin before knee surgery. Knee pain worse with full flexion or extension. Has been walking with RW since surgery and was using SPC PRN at PLOF. Would  like to work on his balance as this has been off d/t an acoustic neuroma on L, be able to stand for prolonged periods at work, work with his horses, ride in car without pain. Had [facility] PT for 1 week.    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  20 min    How long can you stand comfortably?  10 min    How long can you walk comfortably?  6 min    Patient Stated Goals  return to working with horses    Currently in Pain?  Yes    Pain Score  3     Pain Location  Knee    Pain Orientation  Right;Medial    Pain Descriptors / Indicators  Aching    Pain Type  Acute pain;Surgical pain         [facility] PT Assessment - 01/30/19 1328      Assessment    Medical Diagnosis  s/p R TKA    Referring Provider (PT)  Dorna Leitz, MD    Onset Date/Surgical Date  01/19/19    Next MD Visit  02/12/19    Prior Therapy  yes      Precautions   Precautions  --   DVT in R groin- no massage or compression, B hearing loss      Restrictions   Weight Bearing Restrictions  No      Balance Screen   Has the patient fallen in the past 6 months  No    Has the patient had a decrease in activity level because of a fear of falling?   No    Is the patient reluctant to leave their home because of a fear of falling?   No      Home Social worker  Private residence    Living Arrangements  Alone    Available Help at Discharge  Neighbor    Type of [place] to enter    Entrance Stairs-Number of Steps  2    [place]  One level    [place] - 2 wheels;Cane - single point;Crutches;Shower seat;Grab bars - tub/shower      Prior Function   Level of Independence  Independent    Vocation  Part time employment    Financial planner- prolonged standing    Leisure  work with show horses      Cognition   Overall Cognitive Status  Within Functional Limits for tasks assessed      Observation/Other Assessments   Observations  large bruise in R inner thigh; wearing ted hose on B LEs; R calf supple and nonpainful    Focus on Therapeutic Outcomes (FOTO)   Knee: 42 (58% limited, 34% predicted)      Sensation   Light Touch  Appears Intact   N/T in B feet d/t morton's neuroma     Coordination   Gross Motor Movements are Fluid and Coordinated  Yes      Posture/Postural Control   Posture/Postural Control  Postural limitations    Postural Limitations  Rounded Shoulders;Forward head;Posterior pelvic tilt;Weight shift left    Posture Comments  R knee kicked out in front  ROM / Strength   AROM / PROM / Strength  AROM;PROM;Strength      AROM   AROM Assessment  Site  Knee    Right/Left Knee  Left;Right    Right Knee Extension  15    Right Knee Flexion  75    Left Knee Extension  0    Left Knee Flexion  130      PROM   PROM Assessment Site  Knee    Right/Left Knee  Left;Right    Right Knee Extension  11    Right Knee Flexion  80    Left Knee Extension  -1    Left Knee Flexion  132      Strength   Strength Assessment Site  Hip;Knee;Ankle    Right/Left Hip  Right;Left    Right Hip Flexion  2+/5   severe pain along medial knee and up to groin   Right Hip ABduction  4-/5    Right Hip ADduction  4-/5    Left Hip Flexion  4+/5    Left Hip ABduction  4/5    Left Hip ADduction  4/5    Right/Left Knee  Right;Left    Right Knee Flexion  2+/5   severe pain along medial knee and up to groin   Right Knee Extension  2+/5   severe pain along medial knee and up to groin   Left Knee Flexion  4+/5    Left Knee Extension  4+/5    Right/Left Ankle  Right;Left    Right Ankle Dorsiflexion  4+/5    Right Ankle Plantar Flexion  4/5    Left Ankle Dorsiflexion  4+/5    Left Ankle Plantar Flexion  4/5      Palpation   Palpation comment  increased diffuse swelling over R LE, TTP over medial R knee      Ambulation/Gait   Assistive device  Rolling walker    Gait Pattern  Decreased stance time - right;Decreased hip/knee flexion - right;Decreased step length - left;Decreased weight shift to right;Ataxic    Ambulation Surface  Level;Indoor    Gait velocity  decreased                Objective measurements completed on examination: See above findings.              PT Education - 01/30/19 1400    Education Details  prognosis, POC, HEP    Person(s) Educated  Patient    Methods  Explanation;Demonstration;Tactile cues;Verbal cues;Handout    Comprehension  Verbalized understanding;Returned demonstration       PT Short Term Goals - 01/30/19 1608      PT SHORT TERM GOAL #1   Title  Patient to be independent with initial HEP.    Time   3    Period  Weeks    Status  New    Target Date  02/20/19        PT Long Term Goals - 01/30/19 1608      PT LONG TERM GOAL #1   Title  Patient to be independent with advanced HEP.    Time  6    Period  Weeks    Status  New    Target Date  03/13/19      PT LONG TERM GOAL #2   Title  Patient to demonstrate R knee AROM nonpainful and symmetrical to opposite extremity.    Time  6    Period  Weeks  Status  New    Target Date  03/13/19      PT LONG TERM GOAL #3   Title  Patient to demonstrate B LE strength >=4+/5.    Time  6    Period  Weeks    Status  New    Target Date  03/13/19      PT LONG TERM GOAL #4   Title  Patient to report tolerance of 4 hours on his feet with <2/10 pain in order to return to work.    Time  6    Period  Weeks    Status  New    Target Date  03/13/19      PT LONG TERM GOAL #5   Title  Patient to report 50% improvement in confidence with walking on uneven surface with LRAD in order to care for his horses.    Time  6    Period  Weeks    Status  New    Target Date  03/13/19      Additional Long Term Goals   Additional Long Term Goals  Yes      PT LONG TERM GOAL #6   Title  Patient to demonstrate symmetrical step length, weight shift, and knee flexion with ambulation with LRAD.    Time  6    Period  Weeks    Status  New    Target Date  03/13/19             Plan - 01/30/19 1558    Clinical Impression Statement  Patient is a 68y/o M presenting to OPPT with c/o R knee pain s/p R TKA on 01/19/19. Currently ambulating with RW- using SPC PRN at PLOF. Pain is located over R medial knee, with radiation into groin. Also reports that he has been treating for a chronic R groin DVT- still present. R LE diffusely edematous but without excessive warmth, redness, and calf supple and nonpainful. Presenting with limited R LE strength, decreased R knee ROM, gait deviations, and postural impairments. 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 Thus would benefit from skilled PT services 2x/week for 6 weeks to address aforementioned impairments. Patient educated on gentle stretching and strengthening HEP. Educating patient on red flags in case of DVT and to seek immediate medical attention if these arise. Patient reported understanding.    Personal Factors and Comorbidities  Age;Time since onset of injury/illness/exacerbation;Comorbidity 3+;Fitness;Past/Current Experience;Profession    Comorbidities  R plantar fasciitis, GERD, current DVT, COPD, skin CA, anemia, L acoustic neuroma, CKD, renal atrophy, lupus, concussion    Examination-Activity Limitations  Sit;Bed Mobility;Sleep;Bend;Squat;Stairs;Caring for Others;Carry;Stand;Toileting;Dressing;Transfers;Hygiene/Grooming;Lift;Locomotion Level;Reach Overhead    Examination-Participation Restrictions  Church;School;Cleaning;Shop;Community Activity;Driving;Yard Work;Interpersonal Relationship;Laundry;Meal Prep    Stability/Clinical Decision Making  Stable/Uncomplicated    Clinical Decision Making  Low    Rehab Potential  Good    PT Frequency  2x / week    PT Duration  6 weeks    PT Treatment/Interventions  ADLs/Self Care Home Management;Cryotherapy;Electrical Stimulation;Moist Heat;Balance training;Therapeutic exercise;Therapeutic activities;Functional mobility training;Stair training;Gait training;DME Instruction;Ultrasound;Neuromuscular re-education;Cognitive remediation;Patient/family education;Orthotic Fit/Training;Manual techniques;Taping;Energy conservation;Dry needling;Passive range of motion;Scar mobilization    PT Next Visit Plan  reassess HEP    Consulted and Agree with Plan of Care  Patient       Patient will benefit from skilled therapeutic intervention in order to improve the following deficits and impairments:  Hypomobility, Increased edema, Decreased scar mobility, Decreased  activity tolerance, Decreased  strength, Pain, Increased fascial restricitons, Decreased balance, Decreased mobility, Difficulty walking, Improper body mechanics, Decreased range of motion, Hypermobility, Impaired flexibility, Postural dysfunction  Visit Diagnosis: 1. Acute pain of right knee   2. Stiffness of right knee, not elsewhere classified   3. Muscle weakness (generalized)   4. Difficulty in walking, not elsewhere classified        Problem List Patient Active Problem List   Diagnosis Date Noted  . Primary osteoarthritis of right knee 01/19/2019  . CKD (chronic kidney disease), stage III ([place]) 09/13/2017  . Renal atrophy, left 09/13/2017  . Systemic lupus erythematosus ([place]) 09/13/2017  . Dyslipidemia 09/13/2017  . Acute meniscal tear, lateral, right, initial encounter 04/25/2017  . Osteoarthritis of right knee 04/25/2017  . Concussion with loss of consciousness 08/05/2015  . Laceration of spleen 08/05/2015  . Lumbar stress fracture 08/05/2015  . DVT (deep venous thrombosis) ([place]) 10/18/2014  . Hx of bacterial pneumonia 03/10/2013  . COLONIC POLYPS, HX OF 04/08/2010  . NEPHROLITHIASIS, HX OF 04/08/2010  . ACOUSTIC NEUROMA 04/07/2010  . Hypogonadism male 04/07/2010  . Depression with anxiety 04/07/2010  . SLEEP APNEA, OBSTRUCTIVE 04/07/2010  . ASTHMA 04/07/2010  . BENIGN PROSTATIC HYPERTROPHY, WITH OBSTRUCTION 04/07/2010  . 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 PLANTAR FASCIITIS 04/07/2010     Janene Harvey, PT, DPT 01/30/19 4:19 PM   [facility] [address]  [place], [place], [address] Phone: [phone]   Fax:  [phone]  Name: Joseph Tran MRN: [id number] Date of Birth: [birth year]

## 2019-02-05 ENCOUNTER — Other Ambulatory Visit: Payer: Self-pay

## 2019-02-05 ENCOUNTER — Encounter: Payer: Self-pay | Admitting: Physical Therapy

## 2019-02-05 ENCOUNTER — Ambulatory Visit: Payer: Medicare Other | Attending: Orthopedic Surgery | Admitting: Physical Therapy

## 2019-02-05 DIAGNOSIS — M25661 Stiffness of right knee, not elsewhere classified: Secondary | ICD-10-CM | POA: Diagnosis not present

## 2019-02-05 DIAGNOSIS — M6281 Muscle weakness (generalized): Secondary | ICD-10-CM | POA: Diagnosis not present

## 2019-02-05 DIAGNOSIS — M25561 Pain in right knee: Secondary | ICD-10-CM | POA: Diagnosis not present

## 2019-02-05 DIAGNOSIS — R262 Difficulty in walking, not elsewhere classified: Secondary | ICD-10-CM | POA: Diagnosis not present

## 2019-02-05 NOTE — Therapy (Signed)
[facility] [address]  [place], [place], [address] Phone: [phone]   Fax:  [phone]  Physical Therapy Treatment  Patient Details  Name: Joseph Tran MRN: [id number] Date of Birth: [birth year] Referring Provider (PT): Dorna Leitz, MD   Encounter Date: 02/05/2019  PT End of Session - 02/05/19 1402    Visit Number  2    Number of Visits  13    Date for PT Re-Evaluation  03/13/19    Authorization Type  Medicare & AARP    PT Start Time  5093    PT Stop Time  1359    PT Time Calculation (min)  44 min    Activity Tolerance  Patient tolerated treatment well;Patient limited by pain    Behavior During Therapy  [facility] for tasks assessed/performed       Past Medical History:  Diagnosis Date  . Acoustic neuroma (HCC)    benign - left  . Anemia   . Anxiety   . Arthritis   . Asthma    seasonal, when pollen is high, cold air closes me up  . Cancer ([place])    skin cancer -- arm, scalp, upper back  . Chronic kidney disease    sees Dr. [name] at [facility], left kidney is non=functioning.  right kidny is at 50%  . Colon polyps   . Complication of anesthesia   . COPD (chronic obstructive pulmonary disease) ([place])   . Depression    severe.  dx 1985  . DVT (deep venous thrombosis) ([place])    sees Dr. [name]   . GERD (gastroesophageal reflux disease)   . History of kidney stones    has kidney stone now.    . Hypogonadism male    sees Dr. Karsten Ro   . Lupus (systemic lupus erythematosus) (HCC)    sees Dr. Lorenza Cambridge at [facility]   . Plantar fasciitis    rt foot  . PONV (postoperative nausea and vomiting)   . Renal atrophy, left    sees Dr. Heather Roberts at [facility]   . Sleep apnea    tested 2010      Past Surgical History:  Procedure Laterality Date  . CHEST WALL TUMOR EXCISION  2007  . COLONOSCOPY  10/2009   in [place], clear, repeat in 10 yrs   . kidney caluculs  2004-05  . KNEE  ARTHROSCOPY  09-10-11   right knee, per Dr. Dorna Leitz   . KNEE ARTHROSCOPY Right 04/25/2017   Procedure: RIGHT KNEE ARTHROSCOPY, PARTIAL LATERAL MENISECTOMY, CHONDROPLASTY MEDIAL AND LATERAL PATELLAOFEMORAL;  Surgeon: Dorna Leitz, MD;  Location: [place];  Service: Orthopedics;  Laterality: Right;  . madiscus cartilage  2009  . right knee arthroscopy  2008  . TONSILLECTOMY    . TOTAL KNEE ARTHROPLASTY Right 01/19/2019   Procedure: RIGHT TOTAL KNEE ARTHROPLASTY;  Surgeon: Dorna Leitz, MD;  Location: [facility];  Service: Orthopedics;  Laterality: Right;    There were no vitals filed for this visit.  Subjective Assessment - 02/05/19 1312    Subjective  Using the walker in the AM and with transfers as a security blanket. Having trouble sleeping d/t pain.    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  Yes  Pain Score  6     Pain Location  Knee    Pain Orientation  Right;Medial    Pain Descriptors / Indicators  Sharp    Pain Type  Acute pain;Surgical pain         [facility] PT Assessment - 02/05/19 0001      PROM   Right Knee Flexion  91   AAROM                  [facility] Adult PT Treatment/Exercise - 02/05/19 0001      Exercises   Exercises  Knee/Hip      Knee/Hip Exercises: Stretches   Passive Hamstring Stretch  Right;2 reps;30 seconds    Passive Hamstring Stretch Limitations  with strap and self-OP    Hip Flexor Stretch  Right;2 reps;30 seconds    Hip Flexor Stretch Limitations  mod thomas with strap      Knee/Hip Exercises: Aerobic   Stationary Bike  L1 x 6 min (semicircles)      Knee/Hip Exercises: Standing   Heel Raises  Both;1 set;15 reps    Heel Raises Limitations  at counter top    Other Standing Knee Exercises  wt shift R 10x5" at counter top      Knee/Hip Exercises: Seated    Sit to Sand  1 set;5 reps;with UE support   2x with 1 airex, 4x with 2 airex with 1 UR to stand,      Knee/Hip Exercises: Supine   Heel Slides  Right;1 set;10 reps;AAROM    Heel Slides Limitations  10x5"; with strap and orange pball    Straight Leg Raises  Strengthening;Right;1 set;10 reps    Straight Leg Raises Limitations  cues to decrease speed and perform quad set before each rep      Knee/Hip Exercises: Sidelying   Hip ABduction  Strengthening;Right;1 set;10 reps;Left    Hip ABduction Limitations  manual cues for alignment   cues to slow down   Hip ADduction  Strengthening;Left;1 set;10 reps;Right    Hip ADduction Limitations  opposite LE propped on bolster             PT Education - 02/05/19 1401    Education Details  update to HEP    Person(s) Educated  Patient    Methods  Explanation;Demonstration;Tactile cues;Verbal cues;Handout    Comprehension  Verbalized understanding;Returned demonstration       PT Short Term Goals - 02/05/19 1404      PT SHORT TERM GOAL #1   Title  Patient to be independent with initial HEP.    Time  3    Period  Weeks    Status  On-going    Target Date  02/20/19        PT Long Term Goals - 02/05/19 1404      PT LONG TERM GOAL #1   Title  Patient to be independent with advanced HEP.    Time  6    Period  Weeks    Status  On-going      PT LONG TERM GOAL #2   Title  Patient to demonstrate R knee AROM nonpainful and symmetrical to opposite extremity.    Time  6    Period  Weeks    Status  On-going      PT LONG TERM GOAL #3   Title  Patient to demonstrate B LE strength >=4+/5.    Time  6    Period  Weeks    Status  On-going      PT LONG TERM GOAL #4   Title  Patient to report tolerance of 4 hours on his feet with <2/10 pain in order to return to work.    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    Time  6    Period  Weeks    Status  On-going      PT LONG TERM GOAL #6   Title  Patient to demonstrate symmetrical step length, weight shift, and knee flexion with ambulation with LRAD.    Time  6    Period  Weeks    Status  On-going            Plan - 02/05/19 1402    Clinical Impression Statement  Patient arrived to session without AD, noting that he only uses RW in AM and with transfers. Patient able to reach R knee AAROM flexion to 91 degrees with heel slides. Tolerated LE stretching with mild pain at end range. Required cues to decrease speed and muscular control. Patient with better performance after cues given. Introduced new hip strengthening exercises on mat with manual cues for alignment but good tolerance. Reported considerable R medial knee pain with STS transfers, requiring atleast 1 UE support and elevation of seat. Updated HEP with this exercise. Patient declined modalities at end of session and with no complaints upon leaving.    Comorbidities  R plantar fasciitis, GERD, current DVT, COPD, skin CA, anemia, L acoustic neuroma, CKD, renal atrophy, lupus, concussion    PT Treatment/Interventions  ADLs/Self Care Home Management;Cryotherapy;Electrical Stimulation;Moist Heat;Balance training;Therapeutic exercise;Therapeutic activities;Functional mobility training;Stair training;Gait training;DME Instruction;Ultrasound;Neuromuscular re-education;Cognitive remediation;Patient/family education;Orthotic Fit/Training;Manual techniques;Taping;Energy conservation;Dry needling;Passive range of motion;Scar mobilization    PT Next Visit Plan  progress knee ROM and LE strength    Consulted and Agree with Plan of Care  Patient       Patient will benefit from skilled therapeutic intervention in order to improve the following deficits and impairments:  Hypomobility, Increased edema, Decreased scar mobility, Decreased activity tolerance, Decreased strength,  Pain, Increased fascial restricitons, Decreased balance, Decreased mobility, Difficulty walking, Improper body mechanics, Decreased range of motion, Hypermobility, Impaired flexibility, Postural dysfunction  Visit Diagnosis: 1. Acute pain of right knee   2. Stiffness of right knee, not elsewhere classified   3. Muscle weakness (generalized)   4. Difficulty in walking, not elsewhere classified        Problem List Patient Active Problem List   Diagnosis Date Noted  . Primary osteoarthritis of right knee 01/19/2019  . CKD (chronic kidney disease), stage III ([place]) 09/13/2017  . Renal atrophy, left 09/13/2017  . Systemic lupus erythematosus ([place]) 09/13/2017  . Dyslipidemia 09/13/2017  . Acute meniscal tear, lateral, right, initial encounter 04/25/2017  . Osteoarthritis of right knee 04/25/2017  . Concussion with loss of consciousness 08/05/2015  . Laceration of spleen 08/05/2015  . Lumbar stress fracture 08/05/2015  . DVT (deep venous thrombosis) ([place]) 10/18/2014  . Hx of bacterial pneumonia 03/10/2013  . COLONIC POLYPS, HX OF 04/08/2010  . NEPHROLITHIASIS, HX OF 04/08/2010  . ACOUSTIC NEUROMA 04/07/2010  . Hypogonadism male 04/07/2010  . Depression with anxiety 04/07/2010  . SLEEP APNEA, OBSTRUCTIVE 04/07/2010  . ASTHMA 04/07/2010  . BENIGN PROSTATIC  HYPERTROPHY, WITH OBSTRUCTION 04/07/2010  . 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 PLANTAR FASCIITIS 04/07/2010     Janene Harvey, PT, DPT 02/05/19 2:05 PM   [facility] [address]  [place], [place], [address] Phone: [phone]   Fax:  [phone]  Name: Joseph Tran MRN: [id number] Date of Birth: 04-Aug-1950

## 2019-02-08 ENCOUNTER — Encounter: Payer: Medicare Other | Admitting: Physical Therapy

## 2019-02-09 ENCOUNTER — Other Ambulatory Visit: Payer: Self-pay

## 2019-02-09 ENCOUNTER — Encounter: Payer: Self-pay | Admitting: Physical Therapy

## 2019-02-09 ENCOUNTER — Ambulatory Visit: Payer: Medicare Other | Admitting: Physical Therapy

## 2019-02-09 DIAGNOSIS — M6281 Muscle weakness (generalized): Secondary | ICD-10-CM

## 2019-02-09 DIAGNOSIS — M25661 Stiffness of right knee, not elsewhere classified: Secondary | ICD-10-CM | POA: Diagnosis not present

## 2019-02-09 DIAGNOSIS — M25561 Pain in right knee: Secondary | ICD-10-CM | POA: Diagnosis not present

## 2019-02-09 DIAGNOSIS — R262 Difficulty in walking, not elsewhere classified: Secondary | ICD-10-CM | POA: Diagnosis not present

## 2019-02-09 NOTE — Therapy (Signed)
[facility] [address]  [place], [place], [address] Phone: [phone]   Fax:  [phone]  Physical Therapy Treatment  Patient Details  Name: Joseph Tran MRN: [id number] Date of Birth: [birth year] Referring Provider (PT): Dorna Leitz, MD   Encounter Date: 02/09/2019  PT End of Session - 02/09/19 1149    Visit Number  3    Number of Visits  13    Date for PT Re-Evaluation  03/13/19    Authorization Type  Medicare & AARP    PT Start Time  1103    PT Stop Time  1147    PT Time Calculation (min)  44 min    Activity Tolerance  Patient tolerated treatment well;Patient limited by pain    Behavior During Therapy  [facility] for tasks assessed/performed       Past Medical History:  Diagnosis Date  . Acoustic neuroma (HCC)    benign - left  . Anemia   . Anxiety   . Arthritis   . Asthma    seasonal, when pollen is high, cold air closes me up  . Cancer ([place])    skin cancer -- arm, scalp, upper back  . Chronic kidney disease    sees Dr. [name] at [facility], left kidney is non=functioning.  right kidny is at 50%  . Colon polyps   . Complication of anesthesia   . COPD (chronic obstructive pulmonary disease) ([place])   . Depression    severe.  dx 1985  . DVT (deep venous thrombosis) ([place])    sees Dr. [name]   . GERD (gastroesophageal reflux disease)   . History of kidney stones    has kidney stone now.    . Hypogonadism male    sees Dr. Karsten Ro   . Lupus (systemic lupus erythematosus) (HCC)    sees Dr. Lorenza Cambridge at [facility]   . Plantar fasciitis    rt foot  . PONV (postoperative nausea and vomiting)   . Renal atrophy, left    sees Dr. Heather Roberts at [facility]   . Sleep apnea    tested 2010      Past Surgical History:  Procedure Laterality Date  . CHEST WALL TUMOR EXCISION  2007  . COLONOSCOPY  10/2009   in [place], clear, repeat in 10 yrs   . kidney caluculs  2004-05  . KNEE  ARTHROSCOPY  09-10-11   right knee, per Dr. Dorna Leitz   . KNEE ARTHROSCOPY Right 04/25/2017   Procedure: RIGHT KNEE ARTHROSCOPY, PARTIAL LATERAL MENISECTOMY, CHONDROPLASTY MEDIAL AND LATERAL PATELLAOFEMORAL;  Surgeon: Dorna Leitz, MD;  Location: [place];  Service: Orthopedics;  Laterality: Right;  . madiscus cartilage  2009  . right knee arthroscopy  2008  . TONSILLECTOMY    . TOTAL KNEE ARTHROPLASTY Right 01/19/2019   Procedure: RIGHT TOTAL KNEE ARTHROPLASTY;  Surgeon: Dorna Leitz, MD;  [facility];  Service: Orthopedics;  Laterality: Right;    There were no vitals filed for this visit.  Subjective Assessment - 02/09/19 1106    Subjective  Reports that last night was the first night he slept through the night. Still having medial knee pain.    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  Yes  Pain Score  2     Pain Location  Knee    Pain Orientation  Right;Medial    Pain Descriptors / Indicators  Sharp    Pain Type  Acute pain;Surgical pain         [facility] PT Assessment - 02/09/19 0001      AROM   Right Knee Extension  5    Right Knee Flexion  98      PROM   Right Knee Extension  3    Right Knee Flexion  100                   [facility] Adult PT Treatment/Exercise - 02/09/19 0001      Knee/Hip Exercises: Aerobic   Stationary Bike  L1 x 6 min (semicircles and full rotations)      Knee/Hip Exercises: Standing   Heel Raises  Both;1 set;15 reps    Heel Raises Limitations  at counter top    Forward Step Up  Right;1 set;10 reps;Hand Hold: 1;Step Height: 6"    Forward Step Up Limitations  good tolerance and fair quad control    Functional Squat  1 set;10 reps    Functional Squat Limitations  at sink; depth to tolerance    Other Standing Knee Exercises  R knee flexion stretch on 8"  step 5x10" to tolerance      Knee/Hip Exercises: Seated   Other Seated Knee/Hip Exercises  sitting knee flexion stretch 5x10"   to tolerance   Sit to Sand  10 reps;without UE support;2 sets   1 set with 2 airex; 2nd set with 1 airex     Knee/Hip Exercises: Supine   Straight Leg Raises  Strengthening;Right;10 reps;2 sets    Straight Leg Raises Limitations  cues for quad set before each rep; 2nd set with 1#             PT Education - 02/09/19 1149    Education Details  update to HEP    Person(s) Educated  Patient    Methods  Explanation;Demonstration;Tactile cues;Verbal cues;Handout    Comprehension  Verbalized understanding;Returned demonstration       PT Short Term Goals - 02/05/19 1404      PT SHORT TERM GOAL #1   Title  Patient to be independent with initial HEP.    Time  3    Period  Weeks    Status  On-going    Target Date  02/20/19        PT Long Term Goals - 02/05/19 1404      PT LONG TERM GOAL #1   Title  Patient to be independent with advanced HEP.    Time  6    Period  Weeks    Status  On-going      PT LONG TERM GOAL #2   Title  Patient to demonstrate R knee AROM nonpainful and symmetrical to opposite extremity.    Time  6    Period  Weeks    Status  On-going      PT LONG TERM GOAL #3   Title  Patient to demonstrate B LE strength >=4+/5.    Time  6    Period  Weeks    Status  On-going      PT LONG TERM GOAL #4   Title  Patient to report tolerance of 4 hours on his feet with <2/10 pain in order to return to work.    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    Time  6    Period  Weeks    Status  On-going      PT LONG TERM GOAL #6   Title  Patient to demonstrate symmetrical step length, weight shift, and knee flexion with ambulation with LRAD.    Time  6    Period  Weeks     Status  On-going            Plan - 02/09/19 1150    Clinical Impression Statement  Patient reporting that the got his first full night's sleep since his surgery last night. Still reporting medial knee pain with full flexion and extension. Patient able to demonstrate full revolution on bike today. Also able to perform STS without UE support and with seat with less added elevation, with today's performance drastically improved compared to last session. Patient also reporting improvement in tolerance with this activity. Still demonstrating mild quad lag with SLR, but improved with cues to perform quad set before each set. Measured patient's ROM at end of session as patient observably demonstrating improvement. Demonstrated R knee AROM 5-98 degrees, and 3-100 degrees, indicating tremendous progress within very few visits. Plan to continue progressing per POC. Patient declined modalities at end of session and reported understanding of HEP update.    Comorbidities  R plantar fasciitis, GERD, current DVT, COPD, skin CA, anemia, L acoustic neuroma, CKD, renal atrophy, lupus, concussion    PT Treatment/Interventions  ADLs/Self Care Home Management;Cryotherapy;Electrical Stimulation;Moist Heat;Balance training;Therapeutic exercise;Therapeutic activities;Functional mobility training;Stair training;Gait training;DME Instruction;Ultrasound;Neuromuscular re-education;Cognitive remediation;Patient/family education;Orthotic Fit/Training;Manual techniques;Taping;Energy conservation;Dry needling;Passive range of motion;Scar mobilization    PT Next Visit Plan  progress knee ROM and LE strength    Consulted and Agree with Plan of Care  Patient       Patient will benefit from skilled therapeutic intervention in order to improve the following deficits and impairments:  Hypomobility, Increased edema, Decreased scar mobility, Decreased activity tolerance, Decreased strength, Pain, Increased fascial restricitons, Decreased  balance, Decreased mobility, Difficulty walking, Improper body mechanics, Decreased range of motion, Hypermobility, Impaired flexibility, Postural dysfunction  Visit Diagnosis: 1. Acute pain of right knee   2. Stiffness of right knee, not elsewhere classified   3. Muscle weakness (generalized)   4. Difficulty in walking, not elsewhere classified        Problem List Patient Active Problem List   Diagnosis Date Noted  . Primary osteoarthritis of right knee 01/19/2019  . CKD (chronic kidney disease), stage III ([place]) 09/13/2017  . Renal atrophy, left 09/13/2017  . Systemic lupus erythematosus ([place]) 09/13/2017  . Dyslipidemia 09/13/2017  . Acute meniscal tear, lateral, right, initial encounter 04/25/2017  . Osteoarthritis of right knee 04/25/2017  . Concussion with loss of consciousness 08/05/2015  . Laceration of spleen 08/05/2015  . Lumbar stress fracture 08/05/2015  . DVT (deep venous thrombosis) ([place]) 10/18/2014  . Hx of bacterial pneumonia 03/10/2013  . COLONIC POLYPS, HX OF 04/08/2010  . NEPHROLITHIASIS, HX OF 04/08/2010  . ACOUSTIC NEUROMA 04/07/2010  . Hypogonadism male 04/07/2010  . Depression with anxiety 04/07/2010  . SLEEP APNEA, OBSTRUCTIVE 04/07/2010  . ASTHMA 04/07/2010  . BENIGN PROSTATIC HYPERTROPHY, WITH OBSTRUCTION 04/07/2010  . 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 PLANTAR FASCIITIS 04/07/2010  Janene Harvey, PT, DPT 02/09/19 11:51 AM   [facility] [address]  [place], [place], [address] Phone: [phone]   Fax:  [phone]  Name: VINH SACHS MRN: [id number] Date of Birth: 1951/05/04

## 2019-02-12 DIAGNOSIS — M25561 Pain in right knee: Secondary | ICD-10-CM | POA: Diagnosis not present

## 2019-02-12 DIAGNOSIS — M67911 Unspecified disorder of synovium and tendon, right shoulder: Secondary | ICD-10-CM | POA: Diagnosis not present

## 2019-02-12 DIAGNOSIS — Z9889 Other specified postprocedural states: Secondary | ICD-10-CM | POA: Diagnosis not present

## 2019-02-14 ENCOUNTER — Ambulatory Visit: Payer: Medicare Other | Admitting: Physical Therapy

## 2019-02-14 ENCOUNTER — Encounter: Payer: Self-pay | Admitting: Physical Therapy

## 2019-02-14 ENCOUNTER — Other Ambulatory Visit: Payer: Self-pay

## 2019-02-14 DIAGNOSIS — M25561 Pain in right knee: Secondary | ICD-10-CM

## 2019-02-14 DIAGNOSIS — R262 Difficulty in walking, not elsewhere classified: Secondary | ICD-10-CM | POA: Diagnosis not present

## 2019-02-14 DIAGNOSIS — M6281 Muscle weakness (generalized): Secondary | ICD-10-CM

## 2019-02-14 DIAGNOSIS — M25661 Stiffness of right knee, not elsewhere classified: Secondary | ICD-10-CM

## 2019-02-14 NOTE — Therapy (Signed)
[facility] [address]  [place], [place], [address] Phone: [phone]   Fax:  [phone]  Physical Therapy Treatment  Patient Details  Name: Joseph Tran MRN: [id number] Date of Birth: [birth year] Referring Provider (PT): Dorna Leitz, MD   Encounter Date: 02/14/2019  PT End of Session - 02/14/19 1358    Visit Number  4    Number of Visits  13    Date for PT Re-Evaluation  03/13/19    Authorization Type  Medicare & AARP    PT Start Time  4496    PT Stop Time  1357    PT Time Calculation (min)  42 min    Activity Tolerance  Patient tolerated treatment well;Patient limited by pain    Behavior During Therapy  [facility] for tasks assessed/performed       Past Medical History:  Diagnosis Date  . Acoustic neuroma (HCC)    benign - left  . Anemia   . Anxiety   . Arthritis   . Asthma    seasonal, when pollen is high, cold air closes me up  . Cancer ([place])    skin cancer -- arm, scalp, upper back  . Chronic kidney disease    sees Dr. [name] at [facility], left kidney is non=functioning.  right kidny is at 50%  . Colon polyps   . Complication of anesthesia   . COPD (chronic obstructive pulmonary disease) ([place])   . Depression    severe.  dx 1985  . DVT (deep venous thrombosis) ([place])    sees Dr. [name]   . GERD (gastroesophageal reflux disease)   . History of kidney stones    has kidney stone now.    . Hypogonadism male    sees Dr. Karsten Ro   . Lupus (systemic lupus erythematosus) (HCC)    sees Dr. Lorenza Cambridge at [facility]   . Plantar fasciitis    rt foot  . PONV (postoperative nausea and vomiting)   . Renal atrophy, left    sees Dr. Heather Roberts at [facility]   . Sleep apnea    tested 2010      Past Surgical History:  Procedure Laterality Date  . CHEST WALL TUMOR EXCISION  2007  . COLONOSCOPY  10/2009   in [place], clear, repeat in 10 yrs   . kidney caluculs  2004-05  . KNEE  ARTHROSCOPY  09-10-11   right knee, per Dr. Dorna Leitz   . KNEE ARTHROSCOPY Right 04/25/2017   Procedure: RIGHT KNEE ARTHROSCOPY, PARTIAL LATERAL MENISECTOMY, CHONDROPLASTY MEDIAL AND LATERAL PATELLAOFEMORAL;  Surgeon: Dorna Leitz, MD;  Location: [place];  Service: Orthopedics;  Laterality: Right;  . madiscus cartilage  2009  . right knee arthroscopy  2008  . TONSILLECTOMY    . TOTAL KNEE ARTHROPLASTY Right 01/19/2019   Procedure: RIGHT TOTAL KNEE ARTHROPLASTY;  Surgeon: Dorna Leitz, MD;  [facility];  Service: Orthopedics;  Laterality: Right;    There were no vitals filed for this visit.  Subjective Assessment - 02/14/19 1316    Subjective  Reports that he is still having shooting pain over medial knee. MD was pleased with his progress.    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  Yes  Pain Score  6     Pain Orientation  Right;Medial    Pain Descriptors / Indicators  Shooting;Sharp    Pain Type  Acute pain;Surgical pain                       [facility] Adult PT Treatment/Exercise - 02/14/19 0001      Knee/Hip Exercises: Stretches   Passive Hamstring Stretch  Right;2 reps;30 seconds    Passive Hamstring Stretch Limitations  with strap and self-OP    Production assistant, radio Limitations  with strap to tolerance    Hip Flexor Stretch  Right;2 reps;30 seconds    Hip Flexor Stretch Limitations  mod thomas with strap      Knee/Hip Exercises: Aerobic   Stationary Bike  L1 x 6 min (full rotations)      Knee/Hip Exercises: Standing   Hip Abduction  Stengthening;Right;Left;10 reps;Knee straight;2 sets    Abduction Limitations  2nd set with 1#; at counter top   cues to avoiud anterior trunk lean   Hip Extension  Stengthening;Right;Left;1 set;10 reps;Knee  straight    Extension Limitations  2nd set with 1#; at counter top      Knee/Hip Exercises: Seated   Long Arc Quad  Strengthening;Right;1 set;10 reps    Long Arc Quad Weight  1 lbs.    Sit to Sand  10 reps;without UE support;2 sets   with foam pad; 2nd set with ball squeeze     Knee/Hip Exercises: Supine   Straight Leg Raise with External Rotation  Strengthening;Right;1 set;10 reps    Straight Leg Raise with External Rotation Limitations  cues to maintian positioning      Knee/Hip Exercises: Prone   Hip Extension  Strengthening;Right;Left;1 set;10 reps    Hip Extension Limitations  straight knee   cues to avoid trunk rotation            PT Education - 02/14/19 1353    Education Details  update to HEP; discussion on incision care    Person(s) Educated  Patient    Methods  Explanation;Demonstration;Tactile cues;Verbal cues;Handout    Comprehension  Verbalized understanding;Returned demonstration       PT Short Term Goals - 02/05/19 1404      PT SHORT TERM GOAL #1   Title  Patient to be independent with initial HEP.    Time  3    Period  Weeks    Status  On-going    Target Date  02/20/19        PT Long Term Goals - 02/05/19 1404      PT LONG TERM GOAL #1   Title  Patient to be independent with advanced HEP.    Time  6    Period  Weeks    Status  On-going      PT LONG TERM GOAL #2   Title  Patient to demonstrate R knee AROM nonpainful and symmetrical to opposite extremity.    Time  6    Period  Weeks    Status  On-going      PT LONG TERM GOAL #3   Title  Patient to demonstrate B LE strength >=4+/5.    Time  6    Period  Weeks    Status  On-going      PT LONG TERM GOAL #4   Title  Patient to report tolerance of 4 hours on his feet with <2/10 pain in  order to return to work.    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    Time  6    Period  Weeks    Status  On-going      PT LONG TERM GOAL #6   Title  Patient to demonstrate symmetrical step length, weight shift, and knee flexion with ambulation with LRAD.    Time  6    Period  Weeks    Status  On-going            Plan - 02/14/19 1358    Clinical Impression Statement  Patient arrived to session, noting good report from MD at his last MD appointment. Reporting that the R knee is still quite edematous. Admits to performing extra sets of exercises and has resulting increase in knee pain today. Instructed patient on following HEP to avoid injury. Worked on gentle LE stretching to improve pain and ROM. Patient demonstrating good improvement in knee extension with hamstring stretch. Introduced prone glute/HS strengthening with minor cues but with overall good form. Patient continues to improve form with STS; today tolerated addition of adduction squeeze. Patient tolerated session well, and with no complaints at end of session.    Comorbidities  R plantar fasciitis, GERD, current DVT, COPD, skin CA, anemia, L acoustic neuroma, CKD, renal atrophy, lupus, concussion    PT Treatment/Interventions  ADLs/Self Care Home Management;Cryotherapy;Electrical Stimulation;Moist Heat;Balance training;Therapeutic exercise;Therapeutic activities;Functional mobility training;Stair training;Gait training;DME Instruction;Ultrasound;Neuromuscular re-education;Cognitive remediation;Patient/family education;Orthotic Fit/Training;Manual techniques;Taping;Energy conservation;Dry needling;Passive range of motion;Scar mobilization    PT Next Visit Plan  progress knee ROM and LE strength    Consulted and Agree with Plan of Care  Patient       Patient will benefit from skilled therapeutic intervention in order to improve the following deficits and impairments:  Hypomobility, Increased edema, Decreased scar mobility, Decreased activity tolerance, Decreased  strength, Pain, Increased fascial restricitons, Decreased balance, Decreased mobility, Difficulty walking, Improper body mechanics, Decreased range of motion, Hypermobility, Impaired flexibility, Postural dysfunction  Visit Diagnosis: 1. Acute pain of right knee   2. Stiffness of right knee, not elsewhere classified   3. Muscle weakness (generalized)   4. Difficulty in walking, not elsewhere classified        Problem List Patient Active Problem List   Diagnosis Date Noted  . Primary osteoarthritis of right knee 01/19/2019  . CKD (chronic kidney disease), stage III ([place]) 09/13/2017  . Renal atrophy, left 09/13/2017  . Systemic lupus erythematosus ([place]) 09/13/2017  . Dyslipidemia 09/13/2017  . Acute meniscal tear, lateral, right, initial encounter 04/25/2017  . Osteoarthritis of right knee 04/25/2017  . Concussion with loss of consciousness 08/05/2015  . Laceration of spleen 08/05/2015  . Lumbar stress fracture 08/05/2015  . DVT (deep venous thrombosis) ([place]) 10/18/2014  . Hx of bacterial pneumonia 03/10/2013  . COLONIC POLYPS, HX OF 04/08/2010  . NEPHROLITHIASIS, HX OF 04/08/2010  . ACOUSTIC NEUROMA 04/07/2010  . Hypogonadism male 04/07/2010  . Depression with anxiety 04/07/2010  . SLEEP APNEA, OBSTRUCTIVE 04/07/2010  . ASTHMA 04/07/2010  . BENIGN PROSTATIC HYPERTROPHY, WITH OBSTRUCTION 04/07/2010  . 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 PLANTAR FASCIITIS 04/07/2010    Janene Harvey, PT, DPT 02/14/19 2:00 PM   [facility] [address]  [place], [place], [address] Phone: [phone]   Fax:  [phone]  Name: Joseph Tran MRN: [id number] Date of Birth: 1951/04/15

## 2019-02-16 ENCOUNTER — Other Ambulatory Visit: Payer: Self-pay

## 2019-02-16 ENCOUNTER — Encounter: Payer: Self-pay | Admitting: Physical Therapy

## 2019-02-16 ENCOUNTER — Ambulatory Visit: Payer: Medicare Other | Admitting: Physical Therapy

## 2019-02-16 DIAGNOSIS — M25561 Pain in right knee: Secondary | ICD-10-CM | POA: Diagnosis not present

## 2019-02-16 DIAGNOSIS — M6281 Muscle weakness (generalized): Secondary | ICD-10-CM

## 2019-02-16 DIAGNOSIS — M25661 Stiffness of right knee, not elsewhere classified: Secondary | ICD-10-CM | POA: Diagnosis not present

## 2019-02-16 DIAGNOSIS — R262 Difficulty in walking, not elsewhere classified: Secondary | ICD-10-CM

## 2019-02-16 NOTE — Therapy (Signed)
[facility] [address]  [place], [place], [address] Phone: [phone]   Fax:  [phone]  Physical Therapy Treatment  Patient Details  Name: Joseph Tran MRN: [id number] Date of Birth: 23-Jul-1950 Referring Provider (PT): Dorna Leitz, MD   Encounter Date: 02/16/2019  PT End of Session - 02/16/19 1104    Visit Number  5    Number of Visits  13    Date for PT Re-Evaluation  03/13/19    Authorization Type  Medicare & AARP    PT Start Time  1017    PT Stop Time  1101    PT Time Calculation (min)  44 min    Equipment Utilized During Treatment  Gait belt    Activity Tolerance  Patient tolerated treatment well;Patient limited by pain    Behavior During Therapy  [facility] for tasks assessed/performed       Past Medical History:  Diagnosis Date  . Acoustic neuroma (HCC)    benign - left  . Anemia   . Anxiety   . Arthritis   . Asthma    seasonal, when pollen is high, cold air closes me up  . Cancer ([place])    skin cancer -- arm, scalp, upper back  . Chronic kidney disease    sees Dr. 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  right kidny is at 50%  . Colon polyps   . Complication of anesthesia   . COPD (chronic obstructive pulmonary disease) ([place])   . Depression    severe.  dx 1985  . DVT (deep venous thrombosis) ([place])    sees Dr. [name]   . GERD (gastroesophageal reflux disease)   . History of kidney stones    has kidney stone now.    . Hypogonadism male    sees Dr. Karsten Ro   . Lupus (systemic lupus erythematosus) (HCC)    sees Dr. Lorenza Cambridge at [facility] [place] [facility]   . Plantar fasciitis    rt foot  . PONV (postoperative nausea and vomiting)   . Renal atrophy, left    sees Dr. Heather Roberts at [facility]   . Sleep apnea    tested 2010      Past Surgical History:  Procedure Laterality Date  . CHEST WALL TUMOR EXCISION  2007  . COLONOSCOPY  10/2009   in [place], clear, repeat in 10  yrs   . kidney caluculs  2004-05  . KNEE ARTHROSCOPY  09-10-11   right knee, per Dr. Dorna Leitz   . KNEE ARTHROSCOPY Right 04/25/2017   Procedure: RIGHT KNEE ARTHROSCOPY, PARTIAL LATERAL MENISECTOMY, CHONDROPLASTY MEDIAL AND LATERAL PATELLAOFEMORAL;  Surgeon: Dorna Leitz, MD;  Location: [place];  Service: Orthopedics;  Laterality: Right;  . madiscus cartilage  2009  . right knee arthroscopy  2008  . TONSILLECTOMY    . TOTAL KNEE ARTHROPLASTY Right 01/19/2019   Procedure: RIGHT TOTAL KNEE ARTHROPLASTY;  Surgeon: Dorna Leitz, MD;  [facility];  Service: Orthopedics;  Laterality: Right;    There were no vitals filed for this visit.  Subjective Assessment - 02/16/19 1018    Subjective  Reports that he called his MD who advised him to take off his bandage.    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  Yes    Pain Score  8     Pain Location  Knee    Pain Orientation  Right;Medial    Pain Descriptors / Indicators  Sharp    Pain Type  Acute pain;Surgical pain         [facility] PT Assessment - 02/16/19 0001      AROM   Right Knee Flexion  104                   [facility] Adult PT Treatment/Exercise - 02/16/19 0001      Ambulation/Gait   Ambulation Distance (Feet)  300 Feet    Assistive device  None    Gait Pattern  Decreased stance time - right;Decreased hip/knee flexion - right;Decreased step length - left;Decreased weight shift to right;Ataxic    Ambulation Surface  Level;Indoor    Gait velocity  decreased    Gait Comments  cues to increase L step length and TKE on R LE      Knee/Hip Exercises: Stretches   Other Knee/Hip Stretches  sitting R knee flexions tretch 5x10"      Knee/Hip Exercises: Aerobic   Stationary Bike  L1 x 6 min (full/semi rotations)      Knee/Hip Exercises: Standing    Forward Step Up  Right;1 set;Step Height: 6";15 reps;Hand Hold: 2;Step Height: 8"    Forward Step Up Limitations  5x 6", 10x 8"   cues for foot placement   Wall Squat  1 set;10 reps    Wall Squat Limitations  minisquat to tolerance   cues to shift to R LE     Knee/Hip Exercises: Seated   Long Arc Quad  Strengthening;Right;10 reps;2 sets    [employer] Weight  2 lbs.      Manual Therapy   Manual Therapy  Joint mobilization    Manual therapy comments  supine with 1/2 bolster under knee    Joint Mobilization  R knee patellar mobs in all directions grade III to tolerance- good mobility; R knee posterior proximal tibial mobs grade III/IV to tolerance             PT Education - 02/16/19 1103    Education Details  update to HEP- patellar mobs; edu on scar massage to healed portion of incision    Person(s) Educated  Patient    Methods  Explanation;Demonstration;Tactile cues;Verbal cues;Handout    Comprehension  Returned demonstration       PT Short Term Goals - 02/16/19 1158      PT SHORT TERM GOAL #1   Title  Patient to be independent with initial HEP.    Time  3    Period  Weeks    Status  Achieved    Target Date  02/20/19        PT Long Term Goals - 02/05/19 1404      PT LONG TERM GOAL #1   Title  Patient to be independent with advanced HEP.    Time  6    Period  Weeks    Status  On-going      PT LONG TERM GOAL #2   Title  Patient to demonstrate R knee AROM nonpainful and symmetrical to opposite extremity.    Time  6    Period  Weeks    Status  On-going      PT LONG TERM GOAL #3   Title  Patient to demonstrate B LE strength >=4+/5.    Time  6    Period  Weeks    Status  On-going      PT LONG TERM GOAL #4   Title  Patient to report tolerance of 4 hours on his feet with <2/10 pain in order to return to work.    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    Time  6    Period  Weeks    Status  On-going      PT LONG TERM GOAL #6   Title  Patient to demonstrate symmetrical step length, weight shift, and knee flexion with ambulation with LRAD.    Time  6    Period  Weeks    Status  On-going            Plan - 02/16/19 1104    Clinical Impression Statement  Patient arrived to session with report that he called his MD who advised him to remove bandaging over R knee incision. Upon inspection, middle of incision is fully healed, with distal and proximal portions still healing. Instructed patient on scar massage to healed portion, with patient reporting understanding. Introduced wall squats within tolerable ROM with patient requiring cues to shift to R LE. Provided cues during gait training to increase L step length and R TKE at heel strike. Patient with difficulty and mild instability when slowing down gait to work on cues- patient attributes this to acoustic neuroma. Tolerated patellar and knee flexion mobs well, with R knee flexion measuring 104 degrees of AAROM after manual therapy. Instructed patient on self-patellar mobs and provided handout. Patient reported understanding and with no further complaints at end of session.    Comorbidities  R plantar fasciitis, GERD, current DVT, COPD, skin CA, anemia, L acoustic neuroma, CKD, renal atrophy, lupus, concussion    PT Treatment/Interventions  ADLs/Self Care Home Management;Cryotherapy;Electrical Stimulation;Moist Heat;Balance training;Therapeutic exercise;Therapeutic activities;Functional mobility training;Stair training;Gait training;DME Instruction;Ultrasound;Neuromuscular re-education;Cognitive remediation;Patient/family education;Orthotic Fit/Training;Manual techniques;Taping;Energy conservation;Dry needling;Passive range of motion;Scar mobilization    PT Next Visit Plan  progress knee ROM and LE strength    Consulted and Agree  with Plan of Care  Patient       Patient will benefit from skilled therapeutic intervention in order to improve the following deficits and impairments:  Hypomobility, Increased edema, Decreased scar mobility, Decreased activity tolerance, Decreased strength, Pain, Increased fascial restricitons, Decreased balance, Decreased mobility, Difficulty walking, Improper body mechanics, Decreased range of motion, Hypermobility, Impaired flexibility, Postural dysfunction  Visit Diagnosis: 1. Acute pain of right knee   2. Stiffness of right knee, not elsewhere classified   3. Muscle weakness (generalized)   4. Difficulty in walking, not elsewhere classified        Problem List Patient Active Problem List   Diagnosis Date Noted  . Primary osteoarthritis of right knee 01/19/2019  . CKD (chronic kidney disease), stage III ([place]) 09/13/2017  . Renal atrophy, left 09/13/2017  . Systemic lupus erythematosus ([place]) 09/13/2017  . Dyslipidemia 09/13/2017  . Acute meniscal tear, lateral, right, initial encounter 04/25/2017  . Osteoarthritis of right knee 04/25/2017  . Concussion with loss of consciousness 08/05/2015  . Laceration of spleen 08/05/2015  . Lumbar stress fracture 08/05/2015  . DVT (deep venous thrombosis) ([place]) 10/18/2014  . Hx of bacterial pneumonia  03/10/2013  . COLONIC POLYPS, HX OF 04/08/2010  . NEPHROLITHIASIS, HX OF 04/08/2010  . ACOUSTIC NEUROMA 04/07/2010  . Hypogonadism male 04/07/2010  . Depression with anxiety 04/07/2010  . SLEEP APNEA, OBSTRUCTIVE 04/07/2010  . ASTHMA 04/07/2010  . BENIGN PROSTATIC HYPERTROPHY, WITH OBSTRUCTION 04/07/2010  . 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 PLANTAR FASCIITIS 04/07/2010     Janene Harvey, PT, DPT 02/16/19 12:01 PM   [facility] [address]  [place], [place], [address] Phone: [phone]   Fax:  [phone]  Name: Joseph Tran MRN:  [id number] Date of Birth: 1950/08/20

## 2019-02-20 ENCOUNTER — Encounter: Payer: Self-pay | Admitting: Physical Therapy

## 2019-02-20 ENCOUNTER — Other Ambulatory Visit: Payer: Self-pay

## 2019-02-20 ENCOUNTER — Ambulatory Visit: Payer: Medicare Other | Admitting: Physical Therapy

## 2019-02-20 DIAGNOSIS — M25561 Pain in right knee: Secondary | ICD-10-CM | POA: Diagnosis not present

## 2019-02-20 DIAGNOSIS — M6281 Muscle weakness (generalized): Secondary | ICD-10-CM

## 2019-02-20 DIAGNOSIS — R262 Difficulty in walking, not elsewhere classified: Secondary | ICD-10-CM

## 2019-02-20 DIAGNOSIS — M25661 Stiffness of right knee, not elsewhere classified: Secondary | ICD-10-CM

## 2019-02-20 NOTE — Therapy (Signed)
[facility] [address]  [place], [place], [address] Phone: [phone]   Fax:  [phone]  Physical Therapy Treatment  Patient Details  Name: Joseph Tran MRN: [id number] Date of Birth: [birth year] Referring Provider (PT): Dorna Leitz, MD   Encounter Date: 02/20/2019  PT End of Session - 02/20/19 1442    Visit Number  6    Number of Visits  13    Date for PT Re-Evaluation  03/13/19    Authorization Type  Medicare & AARP    PT Start Time  1405    PT Stop Time  1445    PT Time Calculation (min)  40 min    Equipment Utilized During Treatment  Gait belt    Activity Tolerance  Patient tolerated treatment well;Patient limited by pain    Behavior During Therapy  [facility] for tasks assessed/performed       Past Medical History:  Diagnosis Date  . Acoustic neuroma (HCC)    benign - left  . Anemia   . Anxiety   . Arthritis   . Asthma    seasonal, when pollen is high, cold air closes me up  . Cancer ([place])    skin cancer -- arm, scalp, upper back  . Chronic kidney disease    sees Dr. [name] at [facility], left kidney is non=functioning.  right kidny is at 50%  . Colon polyps   . Complication of anesthesia   . COPD (chronic obstructive pulmonary disease) ([place])   . Depression    severe.  dx 1985  . DVT (deep venous thrombosis) ([place])    sees Dr. [name]   . GERD (gastroesophageal reflux disease)   . History of kidney stones    has kidney stone now.    . Hypogonadism male    sees Dr. Karsten Ro   . Lupus (systemic lupus erythematosus) (HCC)    sees Dr. Lorenza Cambridge at [facility]   . Plantar fasciitis    rt foot  . PONV (postoperative nausea and vomiting)   . Renal atrophy, left    sees Dr. Heather Roberts at [facility]   . Sleep apnea    tested 2010      Past Surgical History:  Procedure Laterality Date  . CHEST WALL TUMOR EXCISION  2007  . COLONOSCOPY  10/2009   in [place], clear, repeat in 10  yrs   . kidney caluculs  2004-05  . KNEE ARTHROSCOPY  09-10-11   right knee, per Dr. Dorna Leitz   . KNEE ARTHROSCOPY Right 04/25/2017   Procedure: RIGHT KNEE ARTHROSCOPY, PARTIAL LATERAL MENISECTOMY, CHONDROPLASTY MEDIAL AND LATERAL PATELLAOFEMORAL;  Surgeon: Dorna Leitz, MD;  Location: [facility];  Service: Orthopedics;  Laterality: Right;  . madiscus cartilage  2009  . right knee arthroscopy  2008  . TONSILLECTOMY    . TOTAL KNEE ARTHROPLASTY Right 01/19/2019   Procedure: RIGHT TOTAL KNEE ARTHROPLASTY;  Surgeon: Dorna Leitz, MD;  Location: [facility];  Service: Orthopedics;  Laterality: Right;    There were no vitals filed for this visit.  Subjective Assessment - 02/20/19 1407    Subjective  Reports that he is feeling "terrible." Having a lot more pain this week than last. Not sure if he twisted it.    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  Yes    Pain Score  8     Pain Location  Knee    Pain Orientation  Right;Medial    Pain Descriptors / Indicators  Sharp    Pain Type  Acute pain;Surgical pain                       [facility] Adult PT Treatment/Exercise - 02/20/19 0001      Knee/Hip Exercises: Stretches   Other Knee/Hip Stretches  standing R knee flexion stretch on step "10x5      Knee/Hip Exercises: Aerobic   Stationary Bike  L2 x 6 min (full rotations)      Knee/Hip Exercises: Standing   Terminal Knee Extension  Strengthening;Right;1 set;15 reps    Theraband Level (Terminal Knee Extension)  Level 4 (Blue)    Terminal Knee Extension Limitations  15x3" with 1UE support on chair    Wall Squat  1 set;10 reps    Wall Squat Limitations  minisquat to tolerance    Other Standing Knee Exercises  L toe tap on dynadisc without UE support x10    Other Standing Knee Exercises  R step up/down  on foam without UE support x10      Knee/Hip Exercises: Seated   Long Arc Quad  Strengthening;Right;10 reps;2 sets    Long Arc Quad Weight  --   10x 2#, 10x 3#   Hamstring Curl  Strengthening;Right;1 set;10 reps    Hamstring Limitations  2x10; blue TB    Sit to Sand  10 reps;without UE support;2 sets   red TB above knees;               PT Short Term Goals - 02/16/19 1158      PT SHORT TERM GOAL #1   Title  Patient to be independent with initial HEP.    Time  3    Period  Weeks    Status  Achieved    Target Date  02/20/19        PT Long Term Goals - 02/05/19 1404      PT LONG TERM GOAL #1   Title  Patient to be independent with advanced HEP.    Time  6    Period  Weeks    Status  On-going      PT LONG TERM GOAL #2   Title  Patient to demonstrate R knee AROM nonpainful and symmetrical to opposite extremity.    Time  6    Period  Weeks    Status  On-going      PT LONG TERM GOAL #3   Title  Patient to demonstrate B LE strength >=4+/5.    Time  6    Period  Weeks    Status  On-going      PT LONG TERM GOAL #4   Title  Patient to report tolerance of 4 hours on his feet with <2/10 pain in order to return to work.    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    Time  6    Period  Weeks    Status  On-going      PT LONG TERM  GOAL #6   Title  Patient to demonstrate symmetrical step length, weight shift, and knee flexion with ambulation with LRAD.    Time  6    Period  Weeks    Status  On-going            Plan - 02/20/19 1449    Clinical Impression Statement  Patient arrived to session with report of increased R knee pain without known cause. Patient describing nerve-like pain. Able to perform STS with improved weight shift, but requiring cues to perform eccentric lower with increased control.  Tolerated addition of increased resistance with LAQ with excellent motor control. Also performed sitting HS curls with good ROM. Patient reporting improvement in R knee pain with increased activity during session. Tolerated introduction to dynamic balance training with R LE stabilizing- patient demonstrating only very mild instability. No complaints at end of session.    Comorbidities  R plantar fasciitis, GERD, current DVT, COPD, skin CA, anemia, L acoustic neuroma, CKD, renal atrophy, lupus, concussion    PT Treatment/Interventions  ADLs/Self Care Home Management;Cryotherapy;Electrical Stimulation;Moist Heat;Balance training;Therapeutic exercise;Therapeutic activities;Functional mobility training;Stair training;Gait training;DME Instruction;Ultrasound;Neuromuscular re-education;Cognitive remediation;Patient/family education;Orthotic Fit/Training;Manual techniques;Taping;Energy conservation;Dry needling;Passive range of motion;Scar mobilization    PT Next Visit Plan  progress knee ROM and LE strength    Consulted and Agree with Plan of Care  Patient       Patient will benefit from skilled therapeutic intervention in order to improve the following deficits and impairments:  Hypomobility, Increased edema, Decreased scar mobility, Decreased activity tolerance, Decreased strength, Pain, Increased fascial restricitons, Decreased balance, Decreased mobility, Difficulty walking, Improper body mechanics, Decreased range of motion, Hypermobility, Impaired flexibility, Postural dysfunction  Visit Diagnosis: 1. Acute pain of right knee   2. Stiffness of right knee, not elsewhere classified   3. Muscle weakness (generalized)   4. Difficulty in walking, not elsewhere classified        Problem List Patient Active Problem List   Diagnosis Date Noted  . Primary osteoarthritis of right knee 01/19/2019  . CKD (chronic kidney disease), stage III ([place]) 09/13/2017  . Renal atrophy, left 09/13/2017  . Systemic  lupus erythematosus ([place]) 09/13/2017  . Dyslipidemia 09/13/2017  . Acute meniscal tear, lateral, right, initial encounter 04/25/2017  . Osteoarthritis of right knee 04/25/2017  . Concussion with loss of consciousness 08/05/2015  . Laceration of spleen 08/05/2015  . Lumbar stress fracture 08/05/2015  . DVT (deep venous thrombosis) ([place]) 10/18/2014  . Hx of bacterial pneumonia 03/10/2013  . COLONIC POLYPS, HX OF 04/08/2010  . NEPHROLITHIASIS, HX OF 04/08/2010  . ACOUSTIC NEUROMA 04/07/2010  . Hypogonadism male 04/07/2010  . Depression with anxiety 04/07/2010  . SLEEP APNEA, OBSTRUCTIVE 04/07/2010  . ASTHMA 04/07/2010  . BENIGN PROSTATIC HYPERTROPHY, WITH OBSTRUCTION 04/07/2010  . 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 PLANTAR FASCIITIS 04/07/2010     Janene Harvey, PT, DPT 02/20/19 2:52 PM   [facility] [address]  [place], [place], [address] Phone: [phone]   Fax:  [phone]  Name: Joseph Tran MRN: [id number] Date of Birth: 1950-10-22

## 2019-02-23 ENCOUNTER — Other Ambulatory Visit: Payer: Self-pay

## 2019-02-23 ENCOUNTER — Encounter: Payer: Self-pay | Admitting: Physical Therapy

## 2019-02-23 ENCOUNTER — Ambulatory Visit: Payer: Medicare Other | Admitting: Physical Therapy

## 2019-02-23 DIAGNOSIS — M6281 Muscle weakness (generalized): Secondary | ICD-10-CM

## 2019-02-23 DIAGNOSIS — R262 Difficulty in walking, not elsewhere classified: Secondary | ICD-10-CM

## 2019-02-23 DIAGNOSIS — M25661 Stiffness of right knee, not elsewhere classified: Secondary | ICD-10-CM | POA: Diagnosis not present

## 2019-02-23 DIAGNOSIS — M25561 Pain in right knee: Secondary | ICD-10-CM

## 2019-02-23 NOTE — Therapy (Signed)
[facility] [address]  [place], [place], [address] Phone: [phone]   Fax:  [phone]  Physical Therapy Treatment  Patient Details  Name: Joseph Tran MRN: [id number] Date of Birth: Jan 18, 1951 Referring Provider (PT): Dorna Leitz, MD   Encounter Date: 02/23/2019  PT End of Session - 02/23/19 1155    Visit Number  7    Number of Visits  13    Date for PT Re-Evaluation  03/13/19    Authorization Type  Medicare & AARP    PT Start Time  9702    PT Stop Time  1058    PT Time Calculation (min)  44 min    Activity Tolerance  Patient tolerated treatment well;Patient limited by pain    Behavior During Therapy  [facility] for tasks assessed/performed       Past Medical History:  Diagnosis Date  . Acoustic neuroma (HCC)    benign - left  . Anemia   . Anxiety   . Arthritis   . Asthma    seasonal, when pollen is high, cold air closes me up  . Cancer ([place])    skin cancer -- arm, scalp, upper back  . Chronic kidney disease    sees Dr. [name] at [facility], left kidney is non=functioning.  right kidny is at 50%  . Colon polyps   . Complication of anesthesia   . COPD (chronic obstructive pulmonary disease) ([place])   . Depression    severe.  dx 1985  . DVT (deep venous thrombosis) ([place])    sees Dr. [name]   . GERD (gastroesophageal reflux disease)   . History of kidney stones    has kidney stone now.    . Hypogonadism male    sees Dr. Karsten Ro   . Lupus (systemic lupus erythematosus) (HCC)    sees Dr. Lorenza Cambridge at [facility]   . Plantar fasciitis    rt foot  . PONV (postoperative nausea and vomiting)   . Renal atrophy, left    sees Dr. Heather Roberts at [facility]   . Sleep apnea    tested 2010      Past Surgical History:  Procedure Laterality Date  . CHEST WALL TUMOR EXCISION  2007  . COLONOSCOPY  10/2009   in [place], clear, repeat in 10 yrs   . kidney caluculs  2004-05  . KNEE  ARTHROSCOPY  09-10-11   right knee, per Dr. Dorna Leitz   . KNEE ARTHROSCOPY Right 04/25/2017   Procedure: RIGHT KNEE ARTHROSCOPY, PARTIAL LATERAL MENISECTOMY, CHONDROPLASTY MEDIAL AND LATERAL PATELLAOFEMORAL;  Surgeon: Dorna Leitz, MD;  Location: [facility];  Service: Orthopedics;  Laterality: Right;  . madiscus cartilage  2009  . right knee arthroscopy  2008  . TONSILLECTOMY    . TOTAL KNEE ARTHROPLASTY Right 01/19/2019   Procedure: RIGHT TOTAL KNEE ARTHROPLASTY;  Surgeon: Dorna Leitz, MD;  [facility];  Service: Orthopedics;  Laterality: Right;    There were no vitals filed for this visit.  Subjective Assessment - 02/23/19 1015    Subjective  Reports that he got temportary relief from the pain meds given to him. However, will be discontinuing it d/t CKD III.    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  Yes    Pain Score  7     Pain Location  Knee    Pain Orientation  Right;Medial    Pain Descriptors / Indicators  Sharp    Pain Type  Acute pain;Surgical pain         [facility] PT Assessment - 02/23/19 0001      AROM   Right Knee Flexion  120   after manual therapy                  [facility] Adult PT Treatment/Exercise - 02/23/19 0001      Knee/Hip Exercises: Aerobic   Stationary Bike  L1 x 6 min (full rotations)      Knee/Hip Exercises: Seated   Sit to Sand  without UE support;2 sets;5 reps   CGA/min A for 1st rep to assist; plopping on descent     Knee/Hip Exercises: Supine   Straight Leg Raises  Strengthening;Right;10 reps;1 set    Straight Leg Raises Limitations  1#; slight quad lag    Straight Leg Raise with External Rotation  Strengthening;Right;1 set;10 reps    Straight Leg Raise with External Rotation Limitations  1#; slight quad lag      Manual Therapy   Manual Therapy   Joint mobilization;Passive ROM;Muscle Energy Technique    Manual therapy comments  supine with 1/2 bolster under knee    Joint Mobilization  R knee patellar mobs in all directions grade III to tolerance- good mobility; R knee posterior proximal tibial mobs grade III/IV to tolerance   audible nonpainful  crepitus with tibial mobs   Passive ROM  prone R knee flexion PROM 5x20" to tolerance    Muscle Energy Technique  prone R knee extension MET 5x10"              PT Education - 02/23/19 1057    Education Details  update to HEP    Person(s) Educated  Patient    Methods  Explanation;Demonstration;Tactile cues;Verbal cues;Handout    Comprehension  Verbalized understanding;Returned demonstration       PT Short Term Goals - 02/16/19 1158      PT SHORT TERM GOAL #1   Title  Patient to be independent with initial HEP.    Time  3    Period  Weeks    Status  Achieved    Target Date  02/20/19        PT Long Term Goals - 02/05/19 1404      PT LONG TERM GOAL #1   Title  Patient to be independent with advanced HEP.    Time  6    Period  Weeks    Status  On-going      PT LONG TERM GOAL #2   Title  Patient to demonstrate R knee AROM nonpainful and symmetrical to opposite extremity.    Time  6    Period  Weeks    Status  On-going      PT LONG TERM GOAL #3   Title  Patient to demonstrate B LE strength >=4+/5.    Time  6    Period  Weeks    Status  On-going      PT LONG TERM GOAL #4   Title  Patient to report tolerance of 4 hours on his feet with <2/10 pain in order to return to work.    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    Time  6    Period  Weeks    Status  On-going      PT LONG TERM GOAL #6   Title  Patient to demonstrate symmetrical step length, weight shift, and knee flexion with ambulation with  LRAD.    Time  6    Period  Weeks    Status  On-going            Plan - 02/23/19 1156    Clinical Impression Statement  Patient reporting that he received pain meds from his PA for pain, but looked them up and found that it was contraindicated for his hx of CKD III. Spoke to his Nephrologist who advised him to discontinue these meds. Patient tolerated manual therapy to R knee to improve knee flexion motion. Audible crepitus noted with proximal tibial mobs, but patient reporting no pain. Able to reach 120 degrees of R knee flexion after manual therapy. Able to perform SLR with addition of 1lb with slight quad lag still remaining. Performed STS without elevation of seat. Cues required for set up and slight use of momentum. Patient still demonstrating decreased control on eccentric phase, but overall with big improvement from previous session. Reported understanding of HEP update and with no complaints at end of session.    Comorbidities  R plantar fasciitis, GERD, current DVT, COPD, skin CA, anemia, L acoustic neuroma, CKD, renal atrophy, lupus, concussion    PT Treatment/Interventions  ADLs/Self Care Home Management;Cryotherapy;Electrical Stimulation;Moist Heat;Balance training;Therapeutic exercise;Therapeutic activities;Functional mobility training;Stair training;Gait training;DME Instruction;Ultrasound;Neuromuscular re-education;Cognitive remediation;Patient/family education;Orthotic Fit/Training;Manual techniques;Taping;Energy conservation;Dry needling;Passive range of motion;Scar mobilization    PT Next Visit Plan  progress knee ROM and LE strength    Consulted and Agree with Plan of Care  Patient       Patient will benefit from skilled therapeutic intervention in order to improve the following deficits and impairments:  Hypomobility, Increased edema, Decreased scar mobility, Decreased activity tolerance, Decreased strength, Pain, Increased fascial restricitons, Decreased balance, Decreased  mobility, Difficulty walking, Improper body mechanics, Decreased range of motion, Hypermobility, Impaired flexibility, Postural dysfunction  Visit Diagnosis: Acute pain of right knee  Stiffness of right knee, not elsewhere classified  Muscle weakness (generalized)  Difficulty in walking, not elsewhere classified     Problem List Patient Active Problem List   Diagnosis Date Noted  . Primary osteoarthritis of right knee 01/19/2019  . CKD (chronic kidney disease), stage III ([place]) 09/13/2017  . Renal atrophy, left 09/13/2017  . Systemic lupus erythematosus ([place]) 09/13/2017  . Dyslipidemia 09/13/2017  . Acute meniscal tear, lateral, right, initial encounter 04/25/2017  . Osteoarthritis of right knee 04/25/2017  . Concussion with loss of consciousness 08/05/2015  . Laceration of spleen 08/05/2015  . Lumbar stress fracture 08/05/2015  . DVT (deep venous thrombosis) ([place]) 10/18/2014  . Hx of bacterial pneumonia 03/10/2013  . COLONIC POLYPS, HX OF 04/08/2010  . NEPHROLITHIASIS, HX OF 04/08/2010  . ACOUSTIC NEUROMA 04/07/2010  . Hypogonadism male 04/07/2010  . Depression with anxiety 04/07/2010  . SLEEP APNEA, OBSTRUCTIVE 04/07/2010  . ASTHMA 04/07/2010  . BENIGN PROSTATIC HYPERTROPHY, WITH OBSTRUCTION 04/07/2010  . 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 PLANTAR FASCIITIS 04/07/2010     Janene Harvey, PT, DPT 02/23/19 11:59 AM   [facility] [address]  [place], [place], [address] Phone: [phone]   Fax:  [phone]  Name: Joseph Tran MRN: [id number] Date of Birth: 03/09/1951

## 2019-02-27 ENCOUNTER — Encounter: Payer: Medicare Other | Admitting: Physical Therapy

## 2019-03-01 DIAGNOSIS — Z471 Aftercare following joint replacement surgery: Secondary | ICD-10-CM | POA: Diagnosis not present

## 2019-03-02 ENCOUNTER — Ambulatory Visit: Payer: Medicare Other | Admitting: Physical Therapy

## 2019-03-02 ENCOUNTER — Other Ambulatory Visit: Payer: Self-pay

## 2019-03-02 ENCOUNTER — Encounter: Payer: Self-pay | Admitting: Physical Therapy

## 2019-03-02 DIAGNOSIS — M25661 Stiffness of right knee, not elsewhere classified: Secondary | ICD-10-CM | POA: Diagnosis not present

## 2019-03-02 DIAGNOSIS — M25561 Pain in right knee: Secondary | ICD-10-CM

## 2019-03-02 DIAGNOSIS — M6281 Muscle weakness (generalized): Secondary | ICD-10-CM | POA: Diagnosis not present

## 2019-03-02 DIAGNOSIS — R262 Difficulty in walking, not elsewhere classified: Secondary | ICD-10-CM | POA: Diagnosis not present

## 2019-03-02 NOTE — Therapy (Signed)
[facility] [address]  [place], [place], [address] Phone: [phone]   Fax:  [phone]  Physical Therapy Treatment  Patient Details  Name: Joseph Tran MRN: [id number] Date of Birth: 30-Apr-1951 Referring Provider (PT): Dorna Leitz, MD   Encounter Date: 03/02/2019  PT End of Session - 03/02/19 1120    Visit Number  8    Number of Visits  13    Date for PT Re-Evaluation  03/13/19    Authorization Type  Medicare & AARP    PT Start Time  [phone]    PT Stop Time  1017    PT Time Calculation (min)  46 min    Activity Tolerance  Patient tolerated treatment well    Behavior During Therapy  [facility] for tasks assessed/performed       Past Medical History:  Diagnosis Date  . Acoustic neuroma (HCC)    benign - left  . Anemia   . Anxiety   . Arthritis   . Asthma    seasonal, when pollen is high, cold air closes me up  . Cancer ([place])    skin cancer -- arm, scalp, upper back  . Chronic kidney disease    sees Dr. [name] at [facility], left kidney is non=functioning.  right kidny is at 50%  . Colon polyps   . Complication of anesthesia   . COPD (chronic obstructive pulmonary disease) ([place])   . Depression    severe.  dx 1985  . DVT (deep venous thrombosis) ([place])    sees Dr. [name]   . GERD (gastroesophageal reflux disease)   . History of kidney stones    has kidney stone now.    . Hypogonadism male    sees Dr. Karsten Ro   . Lupus (systemic lupus erythematosus) (HCC)    sees Dr. Lorenza Cambridge at [facility]   . Plantar fasciitis    rt foot  . PONV (postoperative nausea and vomiting)   . Renal atrophy, left    sees Dr. Heather Roberts at [facility]   . Sleep apnea    tested 2010      Past Surgical History:  Procedure Laterality Date  . CHEST WALL TUMOR EXCISION  2007  . COLONOSCOPY  10/2009   in [place], clear, repeat in 10 yrs   . kidney caluculs  2004-05  . KNEE ARTHROSCOPY  09-10-11   right  knee, per Dr. Dorna Leitz   . KNEE ARTHROSCOPY Right 04/25/2017   Procedure: RIGHT KNEE ARTHROSCOPY, PARTIAL LATERAL MENISECTOMY, CHONDROPLASTY MEDIAL AND LATERAL PATELLAOFEMORAL;  Surgeon: Dorna Leitz, MD;  Location: [place];  Service: Orthopedics;  Laterality: Right;  . madiscus cartilage  2009  . right knee arthroscopy  2008  . TONSILLECTOMY    . TOTAL KNEE ARTHROPLASTY Right 01/19/2019   Procedure: RIGHT TOTAL KNEE ARTHROPLASTY;  Surgeon: Dorna Leitz, MD;  Location: [facility];  Service: Orthopedics;  Laterality: Right;    There were no vitals filed for this visit.  Subjective Assessment - 03/02/19 0933    Subjective  Was given Prednisone by MD, who was concerned about swelling but pleased with his motion. Medial knee pain as gotten worse.    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  Yes  Pain Score  7     Pain Location  Knee    Pain Orientation  Right;Medial    Pain Descriptors / Indicators  Sharp    Pain Type  Acute pain;Surgical pain                       [facility] Adult PT Treatment/Exercise - 03/02/19 0001      Ambulation/Gait   Stairs  Yes    Stairs Assistance  4: Min guard    Stair Management Technique  One rail Right;Alternating pattern    Number of Stairs  26    Height of Stairs  8    Gait Comments  cues to descend with increased eccentric control on R LE, cues for foot placement on step when ascending; using 1 handrail ascending/descending      Knee/Hip Exercises: Aerobic   Stationary Bike  L1 x 6 min (full rotations)      Knee/Hip Exercises: Machines for Strengthening   Cybex Knee Extension  B LEs 2x10 25#    Cybex Knee Flexion  B LEs 2x10 25#      Knee/Hip Exercises: Standing   Wall Squat  1 set;10 reps    Wall Squat Limitations  wall squat to tolerance +ball  throw/catch    Other Standing Knee Exercises  sidestepping with red TB around ankles 2x67ft   cues to avoid lateral trunk lean   Other Standing Knee Exercises  anterior monster walk with red TB around ankles 2x17ft      Manual Therapy   Manual Therapy  Taping    Kinesiotex  Edema      Kinesiotix   Edema  R knee medial and lateral edema taping pattern             PT Education - 03/02/19 1119    Education Details  update & consolidation of  HEP; edu on benefits, precautions, wear time, removal of KT tape    Person(s) Educated  Patient    Methods  Explanation;Demonstration;Tactile cues;Verbal cues;Handout    Comprehension  Verbalized understanding;Returned demonstration       PT Short Term Goals - 02/16/19 1158      PT SHORT TERM GOAL #1   Title  Patient to be independent with initial HEP.    Time  3    Period  Weeks    Status  Achieved    Target Date  02/20/19        PT Long Term Goals - 02/05/19 1404      PT LONG TERM GOAL #1   Title  Patient to be independent with advanced HEP.    Time  6    Period  Weeks    Status  On-going      PT LONG TERM GOAL #2   Title  Patient to demonstrate R knee AROM nonpainful and symmetrical to opposite extremity.    Time  6    Period  Weeks    Status  On-going      PT LONG TERM GOAL #3   Title  Patient to demonstrate B LE strength >=4+/5.    Time  6    Period  Weeks    Status  On-going      PT LONG TERM GOAL #4   Title  Patient to report tolerance of 4 hours on his feet with <2/10 pain in order to return to work.    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    Time  6    Period  Weeks    Status  On-going      PT LONG TERM GOAL #6   Title  Patient to demonstrate symmetrical step length, weight shift, and knee flexion with ambulation with LRAD.    Time  6     Period  Weeks    Status  On-going            Plan - 03/02/19 1124    Clinical Impression Statement  Patient reporting that he was prescribed Prednisone by his MD. MD pleased with his ROM but concerned about remaining swelling. R medial and lateral knee slightly edematous at beginning of session. Worked on stair climbing with cues for foot placement when ascending and eccentric control when descending. Able to perform more challenging wall squat exercise today with intermittent cues to shift to R LE. Introduced Patent examiner with patient demonstrating good control and tolerance. Ended session with KT tape to help decrease edema. Patient educated on precautions, wear time, and removal of tape. No complaints at end of session.    Comorbidities  R plantar fasciitis, GERD, current DVT, COPD, skin CA, anemia, L acoustic neuroma, CKD, renal atrophy, lupus, concussion    PT Treatment/Interventions  ADLs/Self Care Home Management;Cryotherapy;Electrical Stimulation;Moist Heat;Balance training;Therapeutic exercise;Therapeutic activities;Functional mobility training;Stair training;Gait training;DME Instruction;Ultrasound;Neuromuscular re-education;Cognitive remediation;Patient/family education;Orthotic Fit/Training;Manual techniques;Taping;Energy conservation;Dry needling;Passive range of motion;Scar mobilization    PT Next Visit Plan  progress knee ROM and LE strength    Consulted and Agree with Plan of Care  Patient       Patient will benefit from skilled therapeutic intervention in order to improve the following deficits and impairments:  Hypomobility, Increased edema, Decreased scar mobility, Decreased activity tolerance, Decreased strength, Pain, Increased fascial restricitons, Decreased balance, Decreased mobility, Difficulty walking, Improper body mechanics, Decreased range of motion, Hypermobility, Impaired flexibility, Postural dysfunction  Visit Diagnosis: Acute pain of right  knee  Stiffness of right knee, not elsewhere classified  Muscle weakness (generalized)  Difficulty in walking, not elsewhere classified     Problem List Patient Active Problem List   Diagnosis Date Noted  . Primary osteoarthritis of right knee 01/19/2019  . CKD (chronic kidney disease), stage III ([place]) 09/13/2017  . Renal atrophy, left 09/13/2017  . Systemic lupus erythematosus ([place]) 09/13/2017  . Dyslipidemia 09/13/2017  . Acute meniscal tear, lateral, right, initial encounter 04/25/2017  . Osteoarthritis of right knee 04/25/2017  . Concussion with loss of consciousness 08/05/2015  . Laceration of spleen 08/05/2015  . Lumbar stress fracture 08/05/2015  . DVT (deep venous thrombosis) ([place]) 10/18/2014  . Hx of bacterial pneumonia 03/10/2013  . COLONIC POLYPS, HX OF 04/08/2010  . NEPHROLITHIASIS, HX OF 04/08/2010  . ACOUSTIC NEUROMA 04/07/2010  . Hypogonadism male 04/07/2010  . Depression with anxiety 04/07/2010  . SLEEP APNEA, OBSTRUCTIVE 04/07/2010  . ASTHMA 04/07/2010  . BENIGN PROSTATIC HYPERTROPHY, WITH OBSTRUCTION 04/07/2010  . 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 PLANTAR FASCIITIS 04/07/2010     Janene Harvey, PT, DPT 03/02/19 11:31 AM   [facility] [address]  [place], [place], [address] Phone: [phone]   Fax:  [phone]  Name: Joseph Tran MRN: [id number] Date of Birth: Nov 26, 1950

## 2019-03-06 ENCOUNTER — Ambulatory Visit: Payer: Medicare Other | Attending: Orthopedic Surgery | Admitting: Physical Therapy

## 2019-03-06 ENCOUNTER — Other Ambulatory Visit: Payer: Self-pay

## 2019-03-06 ENCOUNTER — Encounter: Payer: Self-pay | Admitting: Physical Therapy

## 2019-03-06 DIAGNOSIS — R262 Difficulty in walking, not elsewhere classified: Secondary | ICD-10-CM | POA: Insufficient documentation

## 2019-03-06 DIAGNOSIS — M25661 Stiffness of right knee, not elsewhere classified: Secondary | ICD-10-CM | POA: Insufficient documentation

## 2019-03-06 DIAGNOSIS — M25561 Pain in right knee: Secondary | ICD-10-CM | POA: Diagnosis not present

## 2019-03-06 DIAGNOSIS — M6281 Muscle weakness (generalized): Secondary | ICD-10-CM | POA: Diagnosis not present

## 2019-03-06 NOTE — Therapy (Signed)
[facility] [address]  [place], [place], [address] Phone: [phone]   Fax:  [phone]  Physical Therapy Discharge Summary  Patient Details  Name: Joseph Tran MRN: [id number] Date of Birth: 1950/08/30 Referring Provider (PT): Dorna Leitz, MD   Progress Note Reporting Period 01/30/19 to 03/06/19  See note below for Objective Data and Assessment of Progress/Goals.    Encounter Date: 03/06/2019  PT End of Session - 03/06/19 1616    Visit Number  9    Number of Visits  13    Date for PT Re-Evaluation  03/13/19    Authorization Type  Medicare & AARP    PT Start Time  8185    PT Stop Time  1611    PT Time Calculation (min)  40 min    Activity Tolerance  Patient tolerated treatment well    Behavior During Therapy  [facility] for tasks assessed/performed       Past Medical History:  Diagnosis Date  . Acoustic neuroma (HCC)    benign - left  . Anemia   . Anxiety   . Arthritis   . Asthma    seasonal, when pollen is high, cold air closes me up  . Cancer ([place])    skin cancer -- arm, scalp, upper back  . Chronic kidney disease    sees Dr. [name] at [facility], left kidney is non=functioning.  right kidny is at 50%  . Colon polyps   . Complication of anesthesia   . COPD (chronic obstructive pulmonary disease) ([place])   . Depression    severe.  dx 1985  . DVT (deep venous thrombosis) ([place])    sees Dr. [name]   . GERD (gastroesophageal reflux disease)   . History of kidney stones    has kidney stone now.    . Hypogonadism male    sees Dr. Karsten Ro   . Lupus (systemic lupus erythematosus) (HCC)    sees Dr. Lorenza Cambridge at [facility]   . Plantar fasciitis    rt foot  . PONV (postoperative nausea and vomiting)   . Renal atrophy, left    sees Dr. Heather Roberts at [facility]   . Sleep apnea    tested 2010      Past Surgical History:  Procedure Laterality Date  . CHEST WALL TUMOR EXCISION   2007  . COLONOSCOPY  10/2009   in [place], clear, repeat in 10 yrs   . kidney caluculs  2004-05  . KNEE ARTHROSCOPY  09-10-11   right knee, per Dr. Dorna Leitz   . KNEE ARTHROSCOPY Right 04/25/2017   Procedure: RIGHT KNEE ARTHROSCOPY, PARTIAL LATERAL MENISECTOMY, CHONDROPLASTY MEDIAL AND LATERAL PATELLAOFEMORAL;  Surgeon: Dorna Leitz, MD;  Location: [place];  Service: Orthopedics;  Laterality: Right;  . madiscus cartilage  2009  . right knee arthroscopy  2008  . TONSILLECTOMY    . TOTAL KNEE ARTHROPLASTY Right 01/19/2019   Procedure: RIGHT TOTAL KNEE ARTHROPLASTY;  Surgeon: Dorna Leitz, MD;  [facility];  Service: Orthopedics;  Laterality: Right;    There were no vitals filed for this visit.  Subjective Assessment - 03/06/19 1532    Subjective  Reports that he was able to step into his tall tub without pain. Started work this week and had some resulting soreness. Reports 90% improvement in R knee. Still having medial knee pain. Now able to get into the tub and get into the car without pain.  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  Yes    Pain Score  4     Pain Location  Knee    Pain Orientation  Right;Medial    Pain Descriptors / Indicators  Sharp    Pain Type  Acute pain;Surgical pain         [facility] PT Assessment - 03/06/19 0001      AROM   Right Knee Extension  0    Right Knee Flexion  120      PROM   Left Knee Extension  0    Left Knee Flexion  125      Strength   Right Hip Flexion  5/5    Right Hip ABduction  4+/5    Right Hip ADduction  5/5    Left Hip Flexion  5/5    Left Hip ABduction  4+/5    Left Hip ADduction  5/5    Right Knee Flexion  4+/5    Right Knee Extension  5/5    Left Knee Flexion  4+/5   moderate medial knee pain   Left Knee Extension  5/5    Right Ankle  Dorsiflexion  4+/5    Right Ankle Plantar Flexion  4+/5    Left Ankle Dorsiflexion  4+/5    Left Ankle Plantar Flexion  4+/5                   [facility] Adult PT Treatment/Exercise - 03/06/19 0001      Knee/Hip Exercises: Stretches   Passive Hamstring Stretch  Right;2 reps;30 seconds    Passive Hamstring Stretch Limitations  with strap and self-OP      Knee/Hip Exercises: Aerobic   Stationary Bike  L1 x 6 min (full rotations)      Knee/Hip Exercises: Supine   Heel Slides  Right;1 set;10 reps;AAROM    Heel Slides Limitations  10x5"; with strap and peanut pball             PT Education - 03/06/19 1616    Education Details  update and consolidation of HEP; discussion on objective progress with PT    Person(s) Educated  Patient    Methods  Explanation;Demonstration;Tactile cues;Verbal cues;Handout    Comprehension  Verbalized understanding;Returned demonstration       PT Short Term Goals - 03/06/19 1536      PT SHORT TERM GOAL #1   Title  Patient to be independent with initial HEP.    Time  3    Period  Weeks    Status  Achieved    Target Date  02/20/19        PT Long Term Goals - 03/06/19 1536      PT LONG TERM GOAL #1   Title  Patient to be independent with advanced HEP.    Time  6    Period  Weeks    Status  Achieved      PT LONG TERM GOAL #2   Title  Patient to demonstrate R knee AROM nonpainful and symmetrical to opposite extremity.    Time  6    Period  Weeks    Status  Partially Met   AROM 0-120 degrees, PROM 0-125 degrees     PT LONG TERM GOAL #3  Title  Patient to demonstrate B LE strength >=4+/5.    Time  6    Period  Weeks    Status  Achieved      PT LONG TERM GOAL #4   Title  Patient to report tolerance of 4 hours on his feet with <2/10 pain in order to return to work.    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    Time  6    Period  Weeks    Status  Achieved   reports 90% improvement     PT LONG TERM GOAL #6   Title  Patient to demonstrate symmetrical step length, weight shift, and knee flexion with ambulation with LRAD.    Time  6    Period  Weeks    Status  Achieved            Plan - 03/06/19 1623    Clinical Impression Statement  Patient reports 90% improvement in R knee since initial eval. Notes that he was amazed to find that he was able to step into his bathtub and get into his car without difficulty or pain recently. Patient has also returned to work this week, reporting 5/10 pain at worst after working for 4 hours. Patient has met strength goal, with all muscle groups scored at least 4+/5. ROM has improved greatly- with AROM 0-120 degrees and PROM 0-125 degrees today. 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 Patient is also walking with much improved awareness and no longer antalgic with gait. Updated and consolidated HEP for continued exercise regimen. Patient without questions or complaints at end of session. Patient has demonstrated excellent progress with PT and is ready for DC at this time.    Comorbidities  R plantar fasciitis, GERD, current DVT, COPD, skin CA, anemia, L acoustic neuroma, CKD, renal atrophy, lupus, concussion    PT Treatment/Interventions  ADLs/Self Care Home Management;Cryotherapy;Electrical Stimulation;Moist Heat;Balance training;Therapeutic exercise;Therapeutic activities;Functional mobility training;Stair training;Gait training;DME Instruction;Ultrasound;Neuromuscular re-education;Cognitive remediation;Patient/family education;Orthotic Fit/Training;Manual techniques;Taping;Energy conservation;Dry needling;Passive range of motion;Scar mobilization    PT Next  Visit Plan  DC at this time    Consulted and Agree with Plan of Care  Patient       Patient will benefit from skilled therapeutic intervention in order to improve the following deficits and impairments:  Hypomobility, Increased edema, Decreased scar mobility, Decreased activity tolerance, Decreased strength, Pain, Increased fascial restricitons, Decreased balance, Decreased mobility, Difficulty walking, Improper body mechanics, Decreased range of motion, Hypermobility, Impaired flexibility, Postural dysfunction  Visit Diagnosis: Acute pain of right knee  Stiffness of right knee, not elsewhere classified  Muscle weakness (generalized)  Difficulty in walking, not elsewhere classified     Problem List Patient Active Problem List   Diagnosis Date Noted  . Primary osteoarthritis of right knee 01/19/2019  . CKD (chronic kidney disease), stage III ([place]) 09/13/2017  . Renal atrophy, left 09/13/2017  . Systemic lupus erythematosus ([place]) 09/13/2017  . Dyslipidemia 09/13/2017  . Acute meniscal tear, lateral, right, initial encounter 04/25/2017  . Osteoarthritis of right knee 04/25/2017  . Concussion with loss of  consciousness 08/05/2015  . Laceration of spleen 08/05/2015  . Lumbar stress fracture 08/05/2015  . DVT (deep venous thrombosis) ([place]) 10/18/2014  . Hx of bacterial pneumonia 03/10/2013  . COLONIC POLYPS, HX OF 04/08/2010  . NEPHROLITHIASIS, HX OF 04/08/2010  . ACOUSTIC NEUROMA 04/07/2010  . Hypogonadism male 04/07/2010  . Depression with anxiety 04/07/2010  . SLEEP APNEA, OBSTRUCTIVE 04/07/2010  . ASTHMA 04/07/2010  . BENIGN PROSTATIC HYPERTROPHY, WITH OBSTRUCTION 04/07/2010  . 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 PLANTAR FASCIITIS 04/07/2010     PHYSICAL THERAPY DISCHARGE SUMMARY  Visits from Start of Care: 9  Current functional level related to goals / functional outcomes: See above clinical impression   Remaining deficits: Pain at work, decreased ROM    Education / Equipment: HEP  Plan: Patient agrees to discharge.  Patient goals were partially met. Patient is being discharged due to meeting the stated rehab goals.  ?????     Janene Harvey, PT, DPT 03/06/19 4:25 PM   [facility] [address]  [place], [place], [address] Phone: [phone]   Fax:  [phone]  Name: Joseph Tran MRN: [id number] Date of Birth: 08/17/1950

## 2019-03-07 ENCOUNTER — Inpatient Hospital Stay: Payer: Medicare Other | Attending: Hematology & Oncology | Admitting: Family

## 2019-03-07 ENCOUNTER — Inpatient Hospital Stay: Payer: Medicare Other

## 2019-03-07 ENCOUNTER — Telehealth: Payer: Self-pay | Admitting: Family

## 2019-03-07 ENCOUNTER — Other Ambulatory Visit: Payer: Self-pay

## 2019-03-07 ENCOUNTER — Encounter: Payer: Self-pay | Admitting: Family

## 2019-03-07 VITALS — BP 132/79 | HR 85 | Temp 96.9°F | Resp 18 | Ht 72.0 in | Wt 249.0 lb

## 2019-03-07 DIAGNOSIS — I82401 Acute embolism and thrombosis of unspecified deep veins of right lower extremity: Secondary | ICD-10-CM

## 2019-03-07 DIAGNOSIS — Z7982 Long term (current) use of aspirin: Secondary | ICD-10-CM | POA: Diagnosis not present

## 2019-03-07 DIAGNOSIS — I82811 Embolism and thrombosis of superficial veins of right lower extremities: Secondary | ICD-10-CM | POA: Diagnosis not present

## 2019-03-07 DIAGNOSIS — Z7901 Long term (current) use of anticoagulants: Secondary | ICD-10-CM | POA: Insufficient documentation

## 2019-03-07 DIAGNOSIS — I82511 Chronic embolism and thrombosis of right femoral vein: Secondary | ICD-10-CM | POA: Diagnosis not present

## 2019-03-07 DIAGNOSIS — I82531 Chronic embolism and thrombosis of right popliteal vein: Secondary | ICD-10-CM | POA: Diagnosis not present

## 2019-03-07 LAB — CBC WITH DIFFERENTIAL (CANCER CENTER ONLY)
Abs Immature Granulocytes: 0.22 10*3/uL — ABNORMAL HIGH (ref 0.00–0.07)
Basophils Absolute: 0 10*3/uL (ref 0.0–0.1)
Basophils Relative: 0 %
Eosinophils Absolute: 0 10*3/uL (ref 0.0–0.5)
Eosinophils Relative: 0 %
HCT: 45 % (ref 39.0–52.0)
Hemoglobin: 14.6 g/dL (ref 13.0–17.0)
Immature Granulocytes: 2 %
Lymphocytes Relative: 10 %
Lymphs Abs: 1 10*3/uL (ref 0.7–4.0)
MCH: 31.1 pg (ref 26.0–34.0)
MCHC: 32.4 g/dL (ref 30.0–36.0)
MCV: 95.9 fL (ref 80.0–100.0)
Monocytes Absolute: 0.3 10*3/uL (ref 0.1–1.0)
Monocytes Relative: 4 %
Neutro Abs: 7.8 10*3/uL — ABNORMAL HIGH (ref 1.7–7.7)
Neutrophils Relative %: 84 %
Platelet Count: 268 10*3/uL (ref 150–400)
RBC: 4.69 MIL/uL (ref 4.22–5.81)
RDW: 12.9 % (ref 11.5–15.5)
WBC Count: 9.5 10*3/uL (ref 4.0–10.5)
nRBC: 0 % (ref 0.0–0.2)

## 2019-03-07 LAB — CMP (CANCER CENTER ONLY)
ALT: 15 U/L (ref 0–44)
AST: 12 U/L — ABNORMAL LOW (ref 15–41)
Albumin: 4.2 g/dL (ref 3.5–5.0)
Alkaline Phosphatase: 82 U/L (ref 38–126)
Anion gap: 9 (ref 5–15)
BUN: 33 mg/dL — ABNORMAL HIGH (ref 8–23)
CO2: 24 mmol/L (ref 22–32)
Calcium: 9.5 mg/dL (ref 8.9–10.3)
Chloride: 106 mmol/L (ref 98–111)
Creatinine: 1.9 mg/dL — ABNORMAL HIGH (ref 0.61–1.24)
GFR, Est AFR Am: 41 mL/min — ABNORMAL LOW (ref >60)
GFR, Estimated: 36 mL/min — ABNORMAL LOW (ref >60)
Glucose, Bld: 146 mg/dL — ABNORMAL HIGH (ref 70–99)
Potassium: 4.6 mmol/L (ref 3.5–5.1)
Sodium: 139 mmol/L (ref 135–145)
Total Bilirubin: 0.5 mg/dL (ref 0.3–1.2)
Total Protein: 6.8 g/dL (ref 6.5–8.1)

## 2019-03-07 NOTE — Telephone Encounter (Signed)
Appointments scheduled patient notified per 9/2 los

## 2019-03-07 NOTE — Progress Notes (Signed)
Hematology and Oncology Follow Up Visit  Joseph Tran [id number] [birth year] 68 y.o. 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  He is states that he is taking his Xarelto and 2 baby aspirin daily as prescribed without any issues. No bleeding, bruising or petechiae.  He has chronic swelling in the right lower extremity and still wears a compression stocking for added support which helps.  He is back to work and staying quite busy.  No fever, chills, n/v, cough, rash, dizziness, SOB, chest pain, palpitations, abdominal pain or changes in bowel or bladder habits.  No numbness or tingling in his extremities at this time.  He has maintained a good appetite and is staying well hydrated. His weight is stable.   ECOG Performance Status: 1 - Symptomatic but completely ambulatory  Medications:  Allergies as of 03/07/2019      Reactions   Allopurinol Other (See Comments)   PATIENT PREFERENCE Pt refused due to mom developing steven johnson syndrome.   Penicillins Hives   Has patient had a PCN reaction causing immediate rash, facial/tongue/throat swelling, SOB or lightheadedness with hypotension: Unknown Has patient had a PCN reaction causing severe rash involving mucus membranes or skin necrosis:Unknown Has patient had a PCN reaction that required hospitalization: No Has patient had a PCN reaction occurring within the last 10 years: No If all of the above answers are "NO", then may proceed with Cephalosporin use.   Sulfamethoxazole Hives   Sulfonamide Derivatives Hives   Dilaudid [hydromorphone Hcl] Nausea And Vomiting      Medication List       Accurate as of March 07, 2019  3:20 PM. If you have any questions,  ask your nurse or doctor.        acetaminophen 500 MG tablet Commonly known as: TYLENOL Take 1,000 mg by mouth 2 (two) times a day.   albuterol 108 (90 Base) MCG/ACT inhaler Commonly known as: ProAir HFA Inhale 2 puffs into the lungs every 4 (four) hours as needed for wheezing or shortness of breath.   aspirin EC 81 MG tablet Take 162 mg by mouth daily.   buPROPion 300 MG 24 hr tablet Commonly known as: WELLBUTRIN XL TAKE 1 TABLET BY MOUTH  DAILY   clonazePAM 1 MG tablet Commonly known as: KLONOPIN TAKE 1 TABLET BY MOUTH  TWICE A DAY AS NEEDED FOR  ANXIETY What changed: See the new instructions.   desvenlafaxine 50 MG 24 hr tablet Commonly known as: PRISTIQ TAKE 1 TABLET BY MOUTH  DAILY   docusate sodium 100 MG capsule Commonly known as: Colace Take 1 capsule (100 mg total) by mouth 2 (two) times daily.   finasteride 5 MG tablet Commonly known as: PROSCAR Take 5 mg by mouth every morning.   furosemide 20 MG tablet Commonly known as: LASIX Take 1 tablet (20 mg total) by mouth daily. What changed:   when to take this  reasons to take this   GAMMA AMINOBUTYRIC ACID PO Take 750 mg by mouth daily. GABA   hydroxypropyl methylcellulose / hypromellose 2.5 % ophthalmic solution Commonly known as: ISOPTO TEARS / GONIOVISC Place 1-2 drops into both eyes 3 (three) times daily as needed for dry eyes ((SCHEDULED EACH  MORNING)).   Melatonin 3 MG Tbdp Take 3 mg by mouth at bedtime.   multivitamin with minerals Tabs tablet Take 1 tablet by mouth daily.   oxyCODONE-acetaminophen 5-325 MG tablet Commonly known as: PERCOCET/ROXICET Take 1-2 tablets by mouth every 6 (six) hours as needed for severe pain.   rivaroxaban 20 MG Tabs tablet Commonly known as: XARELTO Take 1/2 tablet (10mg ) daily   Stimulant Laxative 5 MG EC tablet Generic drug: bisacodyl Take 5 mg by mouth at bedtime.   tamsulosin 0.4 MG Caps capsule Commonly known as: FLOMAX Take 0.4 mg by mouth at  bedtime.   tiZANidine 2 MG tablet Commonly known as: ZANAFLEX Take 1 tablet (2 mg total) by mouth every 8 (eight) hours as needed for muscle spasms.       Allergies:  Allergies  Allergen Reactions  . Allopurinol Other (See Comments)    PATIENT PREFERENCE Pt refused due to mom developing steven johnson syndrome.  [name] Penicillins Hives    Has patient had a PCN reaction causing immediate rash, facial/tongue/throat swelling, SOB or lightheadedness with hypotension: Unknown Has patient had a PCN reaction causing severe rash involving mucus membranes or skin necrosis:Unknown Has patient had a PCN reaction that required hospitalization: No Has patient had a PCN reaction occurring within the last 10 years: No If all of the above answers are "NO", then may proceed with Cephalosporin use.   . Sulfamethoxazole Hives  . Sulfonamide Derivatives Hives  . Dilaudid [Hydromorphone Hcl] Nausea And Vomiting    Past Medical History, Surgical history, Social history, and Family History were reviewed and updated.  Review of Systems: All other 10 point review of systems is negative.   Physical Exam:  vitals were not taken for this visit.   Wt Readings from Last 3 Encounters:  01/19/19 248 lb 0.3 oz (112.5 kg)  01/16/19 248 lb (112.5 kg)  11/08/18 247 lb 1.9 oz (112.1 kg)    Ocular: Sclerae unicteric, pupils equal, round and reactive to light Ear-nose-throat: Oropharynx clear, dentition fair Lymphatic: No cervical or supraclavicular adenopathy Lungs no rales or rhonchi, good excursion bilaterally Heart regular rate and rhythm, no murmur appreciated Abd soft, nontender, positive bowel sounds, no liver or spleen tip palpated on exam, no fluid wave  MSK no focal spinal tenderness, no joint edema Neuro: non-focal, well-oriented, appropriate affect Breasts: Deferred   Lab Results  Component Value Date   WBC 9.5 03/07/2019   HGB 14.6 03/07/2019   HCT 45.0 03/07/2019   MCV 95.9 03/07/2019    PLT 268 03/07/2019   Lab Results  Component Value Date   FERRITIN 116 03/31/2017   IRON 101 03/31/2017   TIBC 286 03/31/2017   UIBC 185 03/31/2017   IRONPCTSAT 35 03/31/2017   Lab Results  Component Value Date   RBC 4.69 03/07/2019   No results found for: KPAFRELGTCHN, LAMBDASER, KAPLAMBRATIO No results found for: IGGSERUM, IGA, IGMSERUM No results found for: Odetta Pink, SPEI   Chemistry      Component Value Date/Time   NA 142 01/16/2019 1602   NA 145 06/29/2017 1452   K 4.7 01/16/2019 1602   K 4.7 06/29/2017 1452   CL 111 01/16/2019 1602   CL 107 06/29/2017 1452   CO2 21 (L) 01/16/2019 1602   CO2 25 06/29/2017 1452   BUN 21 01/16/2019 1602   BUN 33 (H) 06/29/2017 1452   CREATININE 1.77 (H) 01/16/2019 1602   CREATININE 1.92 (H) 11/08/2018 1443  CREATININE 2.1 (H) 06/29/2017 1452      Component Value Date/Time   CALCIUM 9.4 01/16/2019 1602   CALCIUM 9.5 06/29/2017 1452   ALKPHOS 79 01/16/2019 1602   ALKPHOS 83 06/29/2017 1452   AST 21 01/16/2019 1602   AST 20 11/08/2018 1443   ALT 24 01/16/2019 1602   ALT 18 11/08/2018 1443   ALT 23 06/29/2017 1452   BILITOT 0.7 01/16/2019 1602   BILITOT 0.6 11/08/2018 1443       Impression and Plan: Mr. 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  He will continue his same regimen of maintenance low dose Xarelto and 2 baby aspirin daily.  BUN and creatinine remain elevated and I have forwarded his labs from today to both his PCP and Urologist.  We will plan to see him back in another 4 months.  He will contact our office with any questions or concerns. We can certainly see him sooner if needed.   Laverna Peace, NP 9/2/20203:20 PM

## 2019-03-08 DIAGNOSIS — N401 Enlarged prostate with lower urinary tract symptoms: Secondary | ICD-10-CM | POA: Diagnosis not present

## 2019-03-08 DIAGNOSIS — N529 Male erectile dysfunction, unspecified: Secondary | ICD-10-CM | POA: Diagnosis not present

## 2019-03-08 DIAGNOSIS — N138 Other obstructive and reflux uropathy: Secondary | ICD-10-CM | POA: Diagnosis not present

## 2019-03-14 ENCOUNTER — Ambulatory Visit: Payer: Medicare Other | Admitting: Physical Therapy

## 2019-03-19 ENCOUNTER — Encounter: Payer: Medicare Other | Admitting: Physical Therapy

## 2019-03-21 ENCOUNTER — Encounter: Payer: Medicare Other | Admitting: Physical Therapy

## 2019-03-24 ENCOUNTER — Ambulatory Visit (INDEPENDENT_AMBULATORY_CARE_PROVIDER_SITE_OTHER): Payer: Medicare Other

## 2019-03-24 DIAGNOSIS — Z23 Encounter for immunization: Secondary | ICD-10-CM | POA: Diagnosis not present

## 2019-03-26 ENCOUNTER — Encounter: Payer: Medicare Other | Admitting: Physical Therapy

## 2019-03-28 DIAGNOSIS — L814 Other melanin hyperpigmentation: Secondary | ICD-10-CM | POA: Diagnosis not present

## 2019-03-28 DIAGNOSIS — D229 Melanocytic nevi, unspecified: Secondary | ICD-10-CM | POA: Diagnosis not present

## 2019-03-28 DIAGNOSIS — L821 Other seborrheic keratosis: Secondary | ICD-10-CM | POA: Diagnosis not present

## 2019-03-28 DIAGNOSIS — L853 Xerosis cutis: Secondary | ICD-10-CM | POA: Diagnosis not present

## 2019-03-28 DIAGNOSIS — L819 Disorder of pigmentation, unspecified: Secondary | ICD-10-CM | POA: Diagnosis not present

## 2019-03-28 DIAGNOSIS — Z85828 Personal history of other malignant neoplasm of skin: Secondary | ICD-10-CM | POA: Diagnosis not present

## 2019-03-29 ENCOUNTER — Encounter: Payer: Medicare Other | Admitting: Physical Therapy

## 2019-04-05 DIAGNOSIS — M25511 Pain in right shoulder: Secondary | ICD-10-CM | POA: Diagnosis not present

## 2019-04-05 DIAGNOSIS — Z96651 Presence of right artificial knee joint: Secondary | ICD-10-CM | POA: Diagnosis not present

## 2019-04-11 DIAGNOSIS — M109 Gout, unspecified: Secondary | ICD-10-CM | POA: Diagnosis not present

## 2019-04-11 DIAGNOSIS — N189 Chronic kidney disease, unspecified: Secondary | ICD-10-CM | POA: Diagnosis not present

## 2019-04-11 DIAGNOSIS — D649 Anemia, unspecified: Secondary | ICD-10-CM | POA: Diagnosis not present

## 2019-04-11 DIAGNOSIS — E7800 Pure hypercholesterolemia, unspecified: Secondary | ICD-10-CM | POA: Diagnosis not present

## 2019-04-11 DIAGNOSIS — I10 Essential (primary) hypertension: Secondary | ICD-10-CM | POA: Diagnosis not present

## 2019-04-11 DIAGNOSIS — N183 Chronic kidney disease, stage 3 unspecified: Secondary | ICD-10-CM | POA: Diagnosis not present

## 2019-04-13 DIAGNOSIS — M25511 Pain in right shoulder: Secondary | ICD-10-CM | POA: Diagnosis not present

## 2019-04-16 ENCOUNTER — Other Ambulatory Visit: Payer: Self-pay | Admitting: Family Medicine

## 2019-05-24 ENCOUNTER — Other Ambulatory Visit: Payer: Self-pay | Admitting: Family Medicine

## 2019-05-29 ENCOUNTER — Telehealth: Payer: Self-pay | Admitting: Family Medicine

## 2019-05-29 ENCOUNTER — Other Ambulatory Visit: Payer: Self-pay

## 2019-05-29 MED ORDER — ALBUTEROL SULFATE HFA 108 (90 BASE) MCG/ACT IN AERS: INHALATION_SPRAY | RESPIRATORY_TRACT | 0 refills | Status: DC

## 2019-05-29 NOTE — Telephone Encounter (Signed)
Rx sent to [facility]

## 2019-05-29 NOTE — Telephone Encounter (Signed)
Patient states [facility] r/x is out of Albuterol, so pt is requesting a 1 canister supply be called in to [facility] on [address] in [place].

## 2019-05-30 ENCOUNTER — Ambulatory Visit (INDEPENDENT_AMBULATORY_CARE_PROVIDER_SITE_OTHER): Payer: Medicare Other | Admitting: Family Medicine

## 2019-05-30 ENCOUNTER — Encounter: Payer: Self-pay | Admitting: Family Medicine

## 2019-05-30 VITALS — BP 122/82 | HR 80 | Temp 97.9°F | Ht 71.5 in | Wt 257.0 lb

## 2019-05-30 DIAGNOSIS — M1711 Unilateral primary osteoarthritis, right knee: Secondary | ICD-10-CM | POA: Diagnosis not present

## 2019-05-30 DIAGNOSIS — R739 Hyperglycemia, unspecified: Secondary | ICD-10-CM

## 2019-05-30 DIAGNOSIS — G4733 Obstructive sleep apnea (adult) (pediatric): Secondary | ICD-10-CM | POA: Diagnosis not present

## 2019-05-30 DIAGNOSIS — I82401 Acute embolism and thrombosis of unspecified deep veins of right lower extremity: Secondary | ICD-10-CM

## 2019-05-30 DIAGNOSIS — E291 Testicular hypofunction: Secondary | ICD-10-CM

## 2019-05-30 DIAGNOSIS — N529 Male erectile dysfunction, unspecified: Secondary | ICD-10-CM | POA: Diagnosis not present

## 2019-05-30 DIAGNOSIS — E785 Hyperlipidemia, unspecified: Secondary | ICD-10-CM | POA: Diagnosis not present

## 2019-05-30 DIAGNOSIS — N261 Atrophy of kidney (terminal): Secondary | ICD-10-CM | POA: Diagnosis not present

## 2019-05-30 DIAGNOSIS — J4520 Mild intermittent asthma, uncomplicated: Secondary | ICD-10-CM | POA: Diagnosis not present

## 2019-05-30 DIAGNOSIS — F418 Other specified anxiety disorders: Secondary | ICD-10-CM | POA: Diagnosis not present

## 2019-05-30 LAB — CBC WITH DIFFERENTIAL/PLATELET
Basophils Absolute: 0.1 10*3/uL (ref 0.0–0.1)
Basophils Relative: 1.2 % (ref 0.0–3.0)
Eosinophils Absolute: 0.2 10*3/uL (ref 0.0–0.7)
Eosinophils Relative: 4.2 % (ref 0.0–5.0)
HCT: 43.9 % (ref 39.0–52.0)
Hemoglobin: 14.5 g/dL (ref 13.0–17.0)
Lymphocytes Relative: 15.9 % (ref 12.0–46.0)
Lymphs Abs: 0.8 10*3/uL (ref 0.7–4.0)
MCHC: 33.1 g/dL (ref 30.0–36.0)
MCV: 93.4 fl (ref 78.0–100.0)
Monocytes Absolute: 0.3 10*3/uL (ref 0.1–1.0)
Monocytes Relative: 6.3 % (ref 3.0–12.0)
Neutro Abs: 3.7 10*3/uL (ref 1.4–7.7)
Neutrophils Relative %: 72.4 % (ref 43.0–77.0)
Platelets: 192 10*3/uL (ref 150.0–400.0)
RBC: 4.7 Mil/uL (ref 4.22–5.81)
RDW: 13.3 % (ref 11.5–15.5)
WBC: 5.1 10*3/uL (ref 4.0–10.5)

## 2019-05-30 LAB — LIPID PANEL
Cholesterol: 202 mg/dL — ABNORMAL HIGH (ref 0–200)
HDL: 39.3 mg/dL (ref >39.00)
LDL Cholesterol: 134 mg/dL — ABNORMAL HIGH (ref 0–99)
NonHDL: 162.66
Total CHOL/HDL Ratio: 5
Triglycerides: 144 mg/dL (ref 0.0–149.0)
VLDL: 28.8 mg/dL (ref 0.0–40.0)

## 2019-05-30 LAB — BASIC METABOLIC PANEL
BUN: 16 mg/dL (ref 6–23)
CO2: 25 mEq/L (ref 19–32)
Calcium: 9 mg/dL (ref 8.4–10.5)
Chloride: 110 mEq/L (ref 96–112)
Creatinine, Ser: 1.58 mg/dL — ABNORMAL HIGH (ref 0.40–1.50)
GFR: 43.84 mL/min — ABNORMAL LOW (ref >60.00)
Glucose, Bld: 90 mg/dL (ref 70–99)
Potassium: 4.8 mEq/L (ref 3.5–5.1)
Sodium: 142 mEq/L (ref 135–145)

## 2019-05-30 LAB — HEPATIC FUNCTION PANEL
ALT: 14 U/L (ref 0–53)
AST: 15 U/L (ref 0–37)
Albumin: 3.8 g/dL (ref 3.5–5.2)
Alkaline Phosphatase: 89 U/L (ref 39–117)
Bilirubin, Direct: 0.1 mg/dL (ref 0.0–0.3)
Total Bilirubin: 0.6 mg/dL (ref 0.2–1.2)
Total Protein: 6.4 g/dL (ref 6.0–8.3)

## 2019-05-30 LAB — TSH: TSH: 1.63 u[IU]/mL (ref 0.35–4.50)

## 2019-05-30 LAB — HEMOGLOBIN A1C: Hgb A1c MFr Bld: 5.1 % (ref 4.6–6.5)

## 2019-05-30 MED ORDER — TADALAFIL 5 MG PO TABS: 5.0000 mg | ORAL_TABLET | Freq: Every day | ORAL | 0 refills | Status: DC | PRN

## 2019-05-30 NOTE — Progress Notes (Signed)
Subjective:    Patient ID: Joseph Tran, male    DOB: 21-Apr-1951, 68 y.o.   MRN: [id number]  HPI Here to follow up on issues. He says he has gained a lot of weight in the past year, and he says he has a "voracious appetite". He tires to avoid carbs and fatty foods but he cannot exercise due to a number of orthopedic problems. He thinks his depression medications may be part of the problem. When he took Effexor in the past he gained a lot of weight, but he was able to lose it later. His depression and anxiety are well controlled on Pristiq and Wellbutrin, so he is not wanting to change these. He is seeing a Dealer at [facility] who is helping him with his BPH and his ED. His PSA level in September was 0.82. his BP is stable. He sees Oncology to manage his anticoagulation. He sees Dr. Berenice Primas for orthopedics issues. He had a right knee replacement, and now he is facing potential surgeries on both shoulders. He was diagnosed with sleep apnea about 10 years ago while he lived in [place] and he has been using a CPAP ever since. He wonders if his settings are correct.    Review of Systems  Constitutional: Positive for unexpected weight change.  HENT: Negative.   Eyes: Negative.   Respiratory: Negative.   Cardiovascular: Negative.   Gastrointestinal: Negative.   Genitourinary: Negative.   Musculoskeletal: Positive for arthralgias.  Skin: Negative.   Neurological: Negative.   Psychiatric/Behavioral: Negative.        Objective:   Physical Exam Constitutional:      General: He is not in acute distress.    Appearance: He is well-developed. He is obese. He is not diaphoretic.  HENT:     Head: Normocephalic and atraumatic.     Right Ear: External ear normal.     Left Ear: External ear normal.     Nose: Nose normal.     Mouth/Throat:     Pharynx: No oropharyngeal exudate.  Eyes:     General: No scleral icterus.       Right eye: No discharge.        Left eye: No discharge.   Conjunctiva/sclera: Conjunctivae normal.     Pupils: Pupils are equal, round, and reactive to light.  Neck:     Musculoskeletal: Neck supple.     Thyroid: No thyromegaly.     Vascular: No JVD.     Trachea: No tracheal deviation.  Cardiovascular:     Rate and Rhythm: Normal rate and regular rhythm.     Heart sounds: Normal heart sounds. No murmur. No friction rub. No gallop.   Pulmonary:     Effort: Pulmonary effort is normal. No respiratory distress.     Breath sounds: Normal breath sounds. No wheezing or rales.  Chest:     Chest wall: No tenderness.  Abdominal:     General: Bowel sounds are normal. There is no distension.     Palpations: Abdomen is soft. There is no mass.     Tenderness: There is no abdominal tenderness. There is no guarding or rebound.  Genitourinary:    Penis: No tenderness.   Musculoskeletal: Normal range of motion.        General: No tenderness.  Lymphadenopathy:     Cervical: No cervical adenopathy.  Skin:    General: Skin is warm and dry.     Coloration: Skin is not pale.  Findings: No erythema or rash.  Neurological:     Mental Status: He is alert and oriented to person, place, and time.     Cranial Nerves: No cranial nerve deficit.     Motor: No abnormal muscle tone.     Coordination: Coordination normal.     Deep Tendon Reflexes: Reflexes are normal and symmetric. Reflexes normal.  Psychiatric:        Behavior: Behavior normal.        Thought Content: Thought content normal.        Judgment: Judgment normal.           Assessment & Plan:  His depression and anxiety are well controlled but he may be having side effects like weight gain. He will think whether he wants to maintain these meds or change them. He will follow up with Orthopedics, Urology, and Oncology as above. We will get fasting labs today to check lipids, A1c, etc. Refer to Pulmonary for the sleep apnea.  [name], MD

## 2019-06-19 DIAGNOSIS — M67912 Unspecified disorder of synovium and tendon, left shoulder: Secondary | ICD-10-CM | POA: Diagnosis not present

## 2019-06-19 DIAGNOSIS — M67911 Unspecified disorder of synovium and tendon, right shoulder: Secondary | ICD-10-CM | POA: Diagnosis not present

## 2019-06-19 DIAGNOSIS — M19011 Primary osteoarthritis, right shoulder: Secondary | ICD-10-CM | POA: Diagnosis not present

## 2019-06-19 DIAGNOSIS — M79671 Pain in right foot: Secondary | ICD-10-CM | POA: Diagnosis not present

## 2019-06-20 ENCOUNTER — Other Ambulatory Visit: Payer: Self-pay | Admitting: Family Medicine

## 2019-06-21 NOTE — Telephone Encounter (Signed)
Last filled 08/07/2018 Last OV 05/30/2019  Ok to fill?

## 2019-07-09 ENCOUNTER — Inpatient Hospital Stay: Payer: Medicare Other

## 2019-07-09 ENCOUNTER — Inpatient Hospital Stay (HOSPITAL_BASED_OUTPATIENT_CLINIC_OR_DEPARTMENT_OTHER): Payer: Medicare Other | Admitting: Family

## 2019-07-09 ENCOUNTER — Ambulatory Visit: Payer: Medicare Other | Admitting: Hematology & Oncology

## 2019-07-09 ENCOUNTER — Other Ambulatory Visit: Payer: Medicare Other

## 2019-07-09 ENCOUNTER — Telehealth: Payer: Self-pay | Admitting: Hematology & Oncology

## 2019-07-09 ENCOUNTER — Encounter: Payer: Self-pay | Admitting: Family

## 2019-07-09 ENCOUNTER — Inpatient Hospital Stay: Payer: Medicare Other | Admitting: Hematology & Oncology

## 2019-07-09 ENCOUNTER — Other Ambulatory Visit: Payer: Self-pay

## 2019-07-09 ENCOUNTER — Inpatient Hospital Stay: Payer: Medicare Other | Attending: Hematology & Oncology

## 2019-07-09 VITALS — BP 119/70 | HR 75 | Temp 97.7°F | Resp 18 | Ht 72.0 in | Wt 257.4 lb

## 2019-07-09 DIAGNOSIS — Z7901 Long term (current) use of anticoagulants: Secondary | ICD-10-CM | POA: Diagnosis not present

## 2019-07-09 DIAGNOSIS — J45909 Unspecified asthma, uncomplicated: Secondary | ICD-10-CM | POA: Diagnosis not present

## 2019-07-09 DIAGNOSIS — I82401 Acute embolism and thrombosis of unspecified deep veins of right lower extremity: Secondary | ICD-10-CM | POA: Diagnosis not present

## 2019-07-09 DIAGNOSIS — I82531 Chronic embolism and thrombosis of right popliteal vein: Secondary | ICD-10-CM | POA: Diagnosis not present

## 2019-07-09 DIAGNOSIS — I82819 Embolism and thrombosis of superficial veins of unspecified lower extremities: Secondary | ICD-10-CM | POA: Insufficient documentation

## 2019-07-09 DIAGNOSIS — R200 Anesthesia of skin: Secondary | ICD-10-CM | POA: Insufficient documentation

## 2019-07-09 DIAGNOSIS — I82511 Chronic embolism and thrombosis of right femoral vein: Secondary | ICD-10-CM | POA: Insufficient documentation

## 2019-07-09 LAB — CBC WITH DIFFERENTIAL (CANCER CENTER ONLY)
Abs Immature Granulocytes: 0.03 10*3/uL (ref 0.00–0.07)
Basophils Absolute: 0.1 10*3/uL (ref 0.0–0.1)
Basophils Relative: 1 %
Eosinophils Absolute: 0.2 10*3/uL (ref 0.0–0.5)
Eosinophils Relative: 3 %
HCT: 45.4 % (ref 39.0–52.0)
Hemoglobin: 14.8 g/dL (ref 13.0–17.0)
Immature Granulocytes: 1 %
Lymphocytes Relative: 16 %
Lymphs Abs: 0.9 10*3/uL (ref 0.7–4.0)
MCH: 30.8 pg (ref 26.0–34.0)
MCHC: 32.6 g/dL (ref 30.0–36.0)
MCV: 94.6 fL (ref 80.0–100.0)
Monocytes Absolute: 0.4 10*3/uL (ref 0.1–1.0)
Monocytes Relative: 7 %
Neutro Abs: 4.3 10*3/uL (ref 1.7–7.7)
Neutrophils Relative %: 72 %
Platelet Count: 191 10*3/uL (ref 150–400)
RBC: 4.8 MIL/uL (ref 4.22–5.81)
RDW: 12 % (ref 11.5–15.5)
WBC Count: 5.9 10*3/uL (ref 4.0–10.5)
nRBC: 0 % (ref 0.0–0.2)

## 2019-07-09 LAB — D-DIMER, QUANTITATIVE: D-Dimer, Quant: 1.39 ug/mL-FEU — ABNORMAL HIGH (ref 0.00–0.50)

## 2019-07-09 LAB — CMP (CANCER CENTER ONLY)
ALT: 15 U/L (ref 0–44)
AST: 15 U/L (ref 15–41)
Albumin: 4.1 g/dL (ref 3.5–5.0)
Alkaline Phosphatase: 77 U/L (ref 38–126)
Anion gap: 7 (ref 5–15)
BUN: 24 mg/dL — ABNORMAL HIGH (ref 8–23)
CO2: 26 mmol/L (ref 22–32)
Calcium: 9.5 mg/dL (ref 8.9–10.3)
Chloride: 107 mmol/L (ref 98–111)
Creatinine: 1.79 mg/dL — ABNORMAL HIGH (ref 0.61–1.24)
GFR, Est AFR Am: 44 mL/min — ABNORMAL LOW (ref >60)
GFR, Estimated: 38 mL/min — ABNORMAL LOW (ref >60)
Glucose, Bld: 108 mg/dL — ABNORMAL HIGH (ref 70–99)
Potassium: 4.4 mmol/L (ref 3.5–5.1)
Sodium: 140 mmol/L (ref 135–145)
Total Bilirubin: 0.6 mg/dL (ref 0.3–1.2)
Total Protein: 6.4 g/dL — ABNORMAL LOW (ref 6.5–8.1)

## 2019-07-09 NOTE — Telephone Encounter (Signed)
Lab appt has been changed for 3:00 per Dr Marin Olp and he was moved to [facility] scheduled as well per Dr E (due to [place])

## 2019-07-09 NOTE — Progress Notes (Signed)
Hematology and Oncology Follow Up Visit  Joseph Tran [id number] 05/07/1951 69 y.o. 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 He continues to do well.  He is having issues with his right shoulder aggravated by a fall 2 months ago and recently received 2 injections which have helped tremendously with his pain and ROM. Now that he has had his right knee replacement he is considering shoulder surgery next.  No fever, chills, n/v, cough, rash, dizziness, chest pain, palpitations, abdominal pain or changes in bowel or bladder habits.  He has SOB at times associated with asthma.  He takes a vegetable based laxative and stool softener as needed for constipation.  The numbness and tingling in the bottoms of his feet waxes and wanes.  No recent falls and no syncopal episodes to report.  No episodes of bleeding. No bruising or petechiae.  He has swelling in his right leg that has improved since he is no longer on his feet for an extended period of time.  He is eating well and staying well hydrated. His weight is stable.   ECOG Performance Status: 1 - Symptomatic but completely ambulatory  Medications:  Allergies as of 07/09/2019      Reactions   Allopurinol Other (See Comments)   PATIENT PREFERENCE Pt refused due to mom developing steven johnson syndrome.   Penicillins Hives   Has patient had a PCN reaction causing immediate rash, facial/tongue/throat swelling, SOB or lightheadedness with hypotension: Unknown Has patient had a PCN reaction causing severe rash involving mucus membranes or skin necrosis:Unknown Has patient had a PCN reaction that required hospitalization: No Has patient had a PCN reaction occurring within the last 10 years: No If all of the above answers are "NO", then  may proceed with Cephalosporin use.   Sulfamethoxazole Hives   Sulfonamide Derivatives Hives   Dilaudid [hydromorphone Hcl] Nausea And Vomiting      Medication List       Accurate as of July 09, 2019  3:33 PM. If you have any questions, ask your nurse or doctor.        STOP taking these medications   oxyCODONE-acetaminophen 5-325 MG tablet Commonly known as: PERCOCET/ROXICET Stopped by: Laverna Peace, NP   traMADol 50 MG tablet Commonly known as: ULTRAM Stopped by: Laverna Peace, NP     TAKE these medications   acetaminophen 500 MG tablet Commonly known as: TYLENOL Take 1,000 mg by mouth 2 (two) times a day.   albuterol 108 (90 Base) MCG/ACT inhaler Commonly known as: ProAir HFA INHALE 2 PUFFS EVERY 4  HOURS AS NEEDED FOR  WHEEZING OR SHORTNESS OF  BREATH   aspirin EC 81 MG tablet Take 162 mg by mouth daily.   buPROPion 300 MG 24 hr tablet Commonly known as: WELLBUTRIN XL TAKE 1 TABLET BY MOUTH  DAILY   clonazePAM 1 MG tablet Commonly known as: KLONOPIN TAKE 1 TABLET BY MOUTH  TWICE A DAY AS NEEDED FOR  ANXIETY   desvenlafaxine 50 MG 24 hr tablet Commonly known as: PRISTIQ TAKE 1 TABLET BY MOUTH  DAILY   finasteride 5 MG tablet Commonly known as: PROSCAR Take 5 mg by mouth every morning.   furosemide 20 MG tablet Commonly known as: LASIX Take 1 tablet (20 mg total) by mouth daily. What  changed:   when to take this  reasons to take this   GAMMA AMINOBUTYRIC ACID PO Take 750 mg by mouth daily. GABA   hydroxypropyl methylcellulose / hypromellose 2.5 % ophthalmic solution Commonly known as: ISOPTO TEARS / GONIOVISC Place 1-2 drops into both eyes 3 (three) times daily as needed for dry eyes ((SCHEDULED EACH MORNING)).   Melatonin 3 MG Tbdp Take 3 mg by mouth at bedtime.   multivitamin with minerals Tabs tablet Take 1 tablet by mouth daily.   rivaroxaban 20 MG Tabs tablet Commonly known as: XARELTO Take 1/2 tablet (10mg ) daily   Stimulant  Laxative 5 MG EC tablet Generic drug: bisacodyl Take 5 mg by mouth at bedtime.   tadalafil 5 MG tablet Commonly known as: Cialis Take 1 tablet (5 mg total) by mouth daily as needed for erectile dysfunction.   tamsulosin 0.4 MG Caps capsule Commonly known as: FLOMAX Take 0.4 mg by mouth at bedtime.       Allergies:  Allergies  Allergen Reactions  . Allopurinol Other (See Comments)    PATIENT PREFERENCE Pt refused due to mom developing steven johnson syndrome.  [name] Penicillins Hives    Has patient had a PCN reaction causing immediate rash, facial/tongue/throat swelling, SOB or lightheadedness with hypotension: Unknown Has patient had a PCN reaction causing severe rash involving mucus membranes or skin necrosis:Unknown Has patient had a PCN reaction that required hospitalization: No Has patient had a PCN reaction occurring within the last 10 years: No If all of the above answers are "NO", then may proceed with Cephalosporin use.   . Sulfamethoxazole Hives  . Sulfonamide Derivatives Hives  . Dilaudid [Hydromorphone Hcl] Nausea And Vomiting    Past Medical History, Surgical history, Social history, and Family History were reviewed and updated.  Review of Systems: All other 10 point review of systems is negative.   Physical Exam:  height is 6' (1.829 m) and weight is 257 lb 6.4 oz (116.8 kg). His temporal temperature is 97.7 F (36.5 C). His blood pressure is 119/70 and his pulse is 75. His respiration is 18 and oxygen saturation is 97%.   Wt Readings from Last 3 Encounters:  07/09/19 257 lb 6.4 oz (116.8 kg)  05/30/19 257 lb (116.6 kg)  03/07/19 249 lb (112.9 kg)    Ocular: Sclerae unicteric, pupils equal, round and reactive to light Ear-nose-throat: Oropharynx clear, dentition fair Lymphatic: No cervical or supraclavicular adenopathy Lungs no rales or rhonchi, good excursion bilaterally Heart regular rate and rhythm, no murmur appreciated Abd soft, nontender, positive  bowel sounds, no liver or spleen tip palpated on exam, no fluid wave  MSK no focal spinal tenderness, no joint edema Neuro: non-focal, well-oriented, appropriate affect Breasts: Deferred   Lab Results  Component Value Date   WBC 5.9 07/09/2019   HGB 14.8 07/09/2019   HCT 45.4 07/09/2019   MCV 94.6 07/09/2019   PLT 191 07/09/2019   Lab Results  Component Value Date   FERRITIN 116 03/31/2017   IRON 101 03/31/2017   TIBC 286 03/31/2017   UIBC 185 03/31/2017   IRONPCTSAT 35 03/31/2017   Lab Results  Component Value Date   RBC 4.80 07/09/2019   No results found for: KPAFRELGTCHN, LAMBDASER, KAPLAMBRATIO No results found for: IGGSERUM, IGA, IGMSERUM No results found for: Ronnald Ramp, A1GS, A2GS, Violet Baldy, MSPIKE, SPEI   Chemistry      Component Value Date/Time   NA 140 07/09/2019 1459   NA 145 06/29/2017 1452  K 4.4 07/09/2019 1459   K 4.7 06/29/2017 1452   CL 107 07/09/2019 1459   CL 107 06/29/2017 1452   CO2 26 07/09/2019 1459   CO2 25 06/29/2017 1452   BUN 24 (H) 07/09/2019 1459   BUN 33 (H) 06/29/2017 1452   CREATININE 1.79 (H) 07/09/2019 1459   CREATININE 2.1 (H) 06/29/2017 1452      Component Value Date/Time   CALCIUM 9.5 07/09/2019 1459   CALCIUM 9.5 06/29/2017 1452   ALKPHOS 77 07/09/2019 1459   ALKPHOS 83 06/29/2017 1452   AST 15 07/09/2019 1459   ALT 15 07/09/2019 1459   ALT 23 06/29/2017 1452   BILITOT 0.6 07/09/2019 1459       Impression and Plan: Mr. 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  He will continue his same regimen with 2 baby aspirin in the morning and Xarelto 10 mg PO daily at bedtime.  We will see him again in another 4 months. If all is well at that time we can go to every 6 months.  He will contact our office with any questions or concerns. We can certainly see  him sooner if needed.   Laverna Peace, NP 1/4/20213:33 PM

## 2019-07-19 DIAGNOSIS — M25512 Pain in left shoulder: Secondary | ICD-10-CM | POA: Diagnosis not present

## 2019-07-19 DIAGNOSIS — M25511 Pain in right shoulder: Secondary | ICD-10-CM | POA: Diagnosis not present

## 2019-07-19 DIAGNOSIS — M67911 Unspecified disorder of synovium and tendon, right shoulder: Secondary | ICD-10-CM | POA: Diagnosis not present

## 2019-07-30 DIAGNOSIS — N401 Enlarged prostate with lower urinary tract symptoms: Secondary | ICD-10-CM | POA: Diagnosis not present

## 2019-07-30 DIAGNOSIS — N529 Male erectile dysfunction, unspecified: Secondary | ICD-10-CM | POA: Diagnosis not present

## 2019-07-30 DIAGNOSIS — N138 Other obstructive and reflux uropathy: Secondary | ICD-10-CM | POA: Diagnosis not present

## 2019-08-19 ENCOUNTER — Ambulatory Visit: Payer: Medicare Other | Attending: Internal Medicine

## 2019-08-19 DIAGNOSIS — Z23 Encounter for immunization: Secondary | ICD-10-CM | POA: Insufficient documentation

## 2019-08-19 NOTE — Progress Notes (Signed)
   Covid-19 Vaccination Clinic  Name:  Joseph Tran    MRN: [id number] DOB: 19-Oct-1950  08/19/2019  [name] was observed post Covid-19 immunization for 15 minutes without incidence. He was provided with Vaccine Information Sheet and instruction to access the V-Safe system.   [name] was instructed to call 911 with any severe reactions post vaccine: [name] Difficulty breathing  . Swelling of your face and throat  . A fast heartbeat  . A bad rash all over your body  . Dizziness and weakness    Immunizations Administered    Name Date Dose VIS Date Route   Pfizer COVID-19 Vaccine 08/19/2019 10:29 AM 0.3 mL 06/15/2019 Intramuscular   Manufacturer: [place]   Lot: [id number]   [place]: [id number]

## 2019-09-06 DIAGNOSIS — I10 Essential (primary) hypertension: Secondary | ICD-10-CM | POA: Diagnosis not present

## 2019-09-06 DIAGNOSIS — D649 Anemia, unspecified: Secondary | ICD-10-CM | POA: Diagnosis not present

## 2019-09-06 DIAGNOSIS — E7800 Pure hypercholesterolemia, unspecified: Secondary | ICD-10-CM | POA: Diagnosis not present

## 2019-09-06 DIAGNOSIS — N189 Chronic kidney disease, unspecified: Secondary | ICD-10-CM | POA: Diagnosis not present

## 2019-09-06 DIAGNOSIS — R809 Proteinuria, unspecified: Secondary | ICD-10-CM | POA: Diagnosis not present

## 2019-09-06 DIAGNOSIS — N183 Chronic kidney disease, stage 3 unspecified: Secondary | ICD-10-CM | POA: Diagnosis not present

## 2019-09-06 DIAGNOSIS — E039 Hypothyroidism, unspecified: Secondary | ICD-10-CM | POA: Diagnosis not present

## 2019-09-11 ENCOUNTER — Ambulatory Visit: Payer: Medicare Other | Attending: Internal Medicine

## 2019-09-11 DIAGNOSIS — Z23 Encounter for immunization: Secondary | ICD-10-CM | POA: Insufficient documentation

## 2019-09-11 NOTE — Progress Notes (Signed)
   Covid-19 Vaccination Clinic  Name:  Joseph Tran    MRN: [id number] DOB: Jul 28, 1950  09/11/2019  [name] was observed post Covid-19 immunization for 15 minutes without incident. He was provided with Vaccine Information Sheet and instruction to access the V-Safe system.   [name] was instructed to call 911 with any severe reactions post vaccine: [name] Difficulty breathing  . Swelling of face and throat  . A fast heartbeat  . A bad rash all over body  . Dizziness and weakness   Immunizations Administered    Name Date Dose VIS Date Route   Pfizer COVID-19 Vaccine 09/11/2019 12:03 PM 0.3 mL 06/15/2019 Intramuscular   Manufacturer: [place]   Lot: [id number]   [place]: [id number]

## 2019-09-18 ENCOUNTER — Other Ambulatory Visit: Payer: Self-pay | Admitting: Hematology & Oncology

## 2019-09-18 DIAGNOSIS — I82401 Acute embolism and thrombosis of unspecified deep veins of right lower extremity: Secondary | ICD-10-CM

## 2019-09-18 DIAGNOSIS — N183 Chronic kidney disease, stage 3 unspecified: Secondary | ICD-10-CM | POA: Diagnosis not present

## 2019-09-18 DIAGNOSIS — N261 Atrophy of kidney (terminal): Secondary | ICD-10-CM | POA: Diagnosis not present

## 2019-09-18 DIAGNOSIS — K8689 Other specified diseases of pancreas: Secondary | ICD-10-CM | POA: Diagnosis not present

## 2019-09-18 DIAGNOSIS — N200 Calculus of kidney: Secondary | ICD-10-CM | POA: Diagnosis not present

## 2019-09-25 ENCOUNTER — Other Ambulatory Visit: Payer: Self-pay

## 2019-09-25 ENCOUNTER — Encounter: Payer: Self-pay | Admitting: Pulmonary Disease

## 2019-09-25 ENCOUNTER — Ambulatory Visit (INDEPENDENT_AMBULATORY_CARE_PROVIDER_SITE_OTHER): Payer: Medicare Other

## 2019-09-25 ENCOUNTER — Ambulatory Visit (INDEPENDENT_AMBULATORY_CARE_PROVIDER_SITE_OTHER): Payer: Medicare Other | Admitting: Pulmonary Disease

## 2019-09-25 VITALS — BP 118/74 | HR 82 | Temp 97.1°F | Ht 72.0 in | Wt 262.6 lb

## 2019-09-25 DIAGNOSIS — R05 Cough: Secondary | ICD-10-CM | POA: Diagnosis not present

## 2019-09-25 DIAGNOSIS — G4733 Obstructive sleep apnea (adult) (pediatric): Secondary | ICD-10-CM | POA: Diagnosis not present

## 2019-09-25 DIAGNOSIS — R059 Cough, unspecified: Secondary | ICD-10-CM

## 2019-09-25 MED ORDER — PREDNISONE 10 MG PO TABS: 10.0000 mg | ORAL_TABLET | Freq: Two times a day (BID) | ORAL | 0 refills | Status: DC

## 2019-09-25 NOTE — Patient Instructions (Signed)
Persistent cough--get a chest x-ray today  Home sleep study for evaluation for obstructive sleep apnea  We will give you a course of prednisone to be used for 7 days   We will see you back in the office in about 2 to 3 months

## 2019-09-25 NOTE — Progress Notes (Signed)
Subjective:    Patient ID: Joseph Tran, male    DOB: Jun 08, 1951, 69 y.o.   MRN: [id number]  Patient diagnosed with obstructive sleep apnea  Sleep apnea was diagnosed in 2010 He has been using CPAP on a regular basis Diagnosed with severe obstructive sleep apnea  Usually goes to bed between 11 and 12 AM Takes him about 15 minutes to fall asleep 1-2 awakenings Has gained about 30 pounds in the last couple years He thinks it is his antidepressants that is making him gain weight-has recently stopped using antidepressants  Sleep is usually restorative Usually wakes up slowly in the mornings Never smoker He had knee replacement about July of last year  He does have some dryness in his mouth No headaches Memory is fair Sister has possible OSA undiagnosed  Sleep study report from [facility] did reveal severe obstructive sleep apnea    Past Medical History:  Diagnosis Date  . Acoustic neuroma (HCC)    benign - left  . Anemia   . Anxiety   . Arthritis   . Asthma    seasonal, when pollen is high, cold air closes me up  . Cancer ([place])    skin cancer -- arm, scalp, upper back  . Chronic kidney disease    sees Dr. [name] at [facility], left kidney is non=functioning.  right kidny is at 50%  . Colon polyps   . Complication of anesthesia   . COPD (chronic obstructive pulmonary disease) ([place])   . Depression    severe.  dx 1985  . DVT (deep venous thrombosis) ([place])    sees Dr. [name]   . GERD (gastroesophageal reflux disease)   . History of kidney stones    has kidney stone now.    . Hypogonadism male    sees Dr. Heather Roberts at [facility]   . Lupus (systemic lupus erythematosus) (HCC)    sees Dr. Lorenza Cambridge at [facility]   . Plantar fasciitis    rt foot  . PONV (postoperative nausea and vomiting)   . Renal atrophy, left    sees Dr. Heather Roberts at [facility]   . Sleep apnea    tested 2010     Social History   Socioeconomic  History  . Marital status: Single    Spouse name: Not on file  . Number of children: Not on file  . Years of education: Not on file  . Highest education level: Not on file  Occupational History  . Not on file  Tobacco Use  . Smoking status: Never Smoker  . Smokeless tobacco: Never Used  . Tobacco comment: NEVER USED TOBACCO  Substance and Sexual Activity  . Alcohol use: No    Alcohol/week: 0.0 standard drinks  . Drug use: No  . Sexual activity: Not on file  Other Topics Concern  . Not on file  Social History Narrative  . Not on file   Social Determinants of Health   Financial Resource Strain:   . Difficulty of Paying Living Expenses:   Food Insecurity:   . Worried About Charity fundraiser in the Last Year:   . Arboriculturist in the Last Year:   Transportation Needs:   . Film/video editor (Medical):   [name] Lack of Transportation (Non-Medical):   Physical Activity:   . Days of Exercise per Week:   . Minutes of Exercise per Session:   Stress:   . Feeling of Stress :  Social Connections:   . Frequency of Communication with Friends and Family:   . Frequency of Social Gatherings with Friends and Family:   . Attends Religious Services:   . Active Member of Clubs or Organizations:   . Attends Archivist Meetings:   [name] Marital Status:   Intimate Partner Violence:   . Fear of Current or Ex-Partner:   . Emotionally Abused:   [name] Physically Abused:   . Sexually Abused:    Family History  Problem Relation Age of Onset  . Heart disease Other        parents  . Hyperlipidemia Other   . Stroke Other        grandparents  . Sudden death Other        uncle less than 16 yrs old  . Heart disease Father     Review of Systems  Constitutional: Positive for unexpected weight change. Negative for fever.  HENT: Positive for dental problem. Negative for congestion, ear pain, nosebleeds, postnasal drip, rhinorrhea, sinus pressure, sneezing, sore throat and trouble  swallowing.   Eyes: Negative for redness and itching.  Respiratory: Positive for cough and shortness of breath. Negative for chest tightness and wheezing.   Cardiovascular: Negative for palpitations and leg swelling.  Gastrointestinal: Negative for nausea and vomiting.  Genitourinary: Negative for dysuria.  Musculoskeletal: Positive for joint swelling.  Skin: Negative for rash.  Allergic/Immunologic: Negative.  Negative for environmental allergies, food allergies and immunocompromised state.  Neurological: Negative for headaches.  Hematological: Does not bruise/bleed easily.  Psychiatric/Behavioral: Positive for dysphoric mood. The patient is nervous/anxious.       Objective:   Physical Exam Constitutional:      Appearance: He is obese.  HENT:     Head: Normocephalic.     Right Ear: Tympanic membrane normal.     Mouth/Throat:     Mouth: Mucous membranes are moist.  Eyes:     Extraocular Movements: Extraocular movements intact.     Pupils: Pupils are equal, round, and reactive to light.  Cardiovascular:     Rate and Rhythm: Normal rate and regular rhythm.     Pulses: Normal pulses.     Heart sounds: No murmur. No friction rub.  Pulmonary:     Effort: Pulmonary effort is normal. No respiratory distress.     Breath sounds: Normal breath sounds. No stridor. No wheezing or rhonchi.  Musculoskeletal:        General: Normal range of motion.     Cervical back: Normal range of motion and neck supple. No tenderness.  Skin:    General: Skin is warm.  Neurological:     General: No focal deficit present.     Mental Status: He is alert.  Psychiatric:        Mood and Affect: Mood normal.    Vitals:   09/25/19 1051  BP: 118/74  Pulse: 82  Temp: (!) 97.1 F (36.2 C)  SpO2: 95%   Results of the Epworth flowsheet 09/25/2019  Sitting and reading 2  Watching TV 2  Sitting, inactive in a public place (e.g. a theatre or a meeting) 1  As a passenger in a car for an hour without a  break 1  Lying down to rest in the afternoon when circumstances permit 2  Sitting and talking to someone 0  Sitting quietly after a lunch without alcohol 1  In a car, while stopped for a few minutes in traffic 0  Total score 9   Sleep  study results shows severe obstructive sleep apnea    Assessment & Plan:  .  Severe obstructive sleep apnea -Machine is dated -Does not have a smart card  .  History of asthma  .  Protracted cough  Plan Home sleep study Chest x-ray today Prednisone 10 p.o. twice daily for 7 days  Continue using current inhalers  I will follow-up in about 3 months  To call with any significant concerns -

## 2019-10-09 ENCOUNTER — Other Ambulatory Visit: Payer: Self-pay

## 2019-10-09 ENCOUNTER — Ambulatory Visit: Payer: Medicare Other

## 2019-10-09 DIAGNOSIS — G4733 Obstructive sleep apnea (adult) (pediatric): Secondary | ICD-10-CM

## 2019-10-17 ENCOUNTER — Other Ambulatory Visit: Payer: Self-pay | Admitting: *Deleted

## 2019-10-17 MED ORDER — FUROSEMIDE 20 MG PO TABS: 20.0000 mg | ORAL_TABLET | Freq: Every day | ORAL | 3 refills | Status: DC

## 2019-10-19 ENCOUNTER — Telehealth: Payer: Self-pay | Admitting: Pulmonary Disease

## 2019-10-19 NOTE — Telephone Encounter (Signed)
Called and spoke with Patient.  Dr.Olalere's recommendations given.  Understanding stated. Patient scheduled with Dr Ander Slade 11/01/19, at 0915. Patient stated he wore is HST all night, but did think it may have malfunctioned, and not recorded his entire nights sleep.

## 2019-10-19 NOTE — Telephone Encounter (Signed)
Received sleep study results and recommendations from [facility].    Impressions: Suboptimal study  Recommendations: Inconclusive study Clinical follow up   ATC patient.  LM to call back for sleep results.

## 2019-10-19 NOTE — Telephone Encounter (Signed)
Pt called back for results-- please return call

## 2019-11-01 ENCOUNTER — Encounter: Payer: Self-pay | Admitting: Pulmonary Disease

## 2019-11-01 ENCOUNTER — Other Ambulatory Visit: Payer: Self-pay

## 2019-11-01 ENCOUNTER — Ambulatory Visit (INDEPENDENT_AMBULATORY_CARE_PROVIDER_SITE_OTHER): Payer: Medicare Other | Admitting: Pulmonary Disease

## 2019-11-01 VITALS — BP 124/70 | HR 77 | Temp 97.7°F | Ht 72.0 in | Wt 260.8 lb

## 2019-11-01 DIAGNOSIS — G4733 Obstructive sleep apnea (adult) (pediatric): Secondary | ICD-10-CM | POA: Diagnosis not present

## 2019-11-01 DIAGNOSIS — J45909 Unspecified asthma, uncomplicated: Secondary | ICD-10-CM | POA: Diagnosis not present

## 2019-11-01 MED ORDER — FLUTICASONE-SALMETEROL 250-50 MCG/DOSE IN AEPB: 1.0000 | INHALATION_SPRAY | Freq: Two times a day (BID) | RESPIRATORY_TRACT | 5 refills | Status: DC

## 2019-11-01 NOTE — Progress Notes (Signed)
Subjective:    Patient ID: Joseph Tran, male    DOB: October 21, 1950, 69 y.o.   MRN: [id number]  Patient diagnosed with obstructive sleep apnea  Recently had a sleep study performed Suboptimal study Machine was faulty  Sleep apnea was diagnosed in 2010 He has been using CPAP on a regular basis Diagnosed with severe obstructive sleep apnea  Usually goes to bed between 11 and 12 AM Takes him about 15 minutes to fall asleep 1-2 awakenings Has gained about 30 pounds in the last couple years He thinks it is his antidepressants that is making him gain weight-has recently stopped using antidepressants  Sleep is usually restorative Usually wakes up slowly in the mornings Never smoker He had knee replacement about July of last year  He does have some dryness in his mouth No headaches Memory is fair Sister has possible OSA undiagnosed  Sleep study report from [facility] did reveal severe obstructive sleep apnea  Has been coughing more, history of asthma, currently only on albuterol  Past Medical History:  Diagnosis Date  . Acoustic neuroma (HCC)    benign - left  . Anemia   . Anxiety   . Arthritis   . Asthma    seasonal, when pollen is high, cold air closes me up  . Cancer ([place])    skin cancer -- arm, scalp, upper back  . Chronic kidney disease    sees Dr. [name] at [facility], left kidney is non=functioning.  right kidny is at 50%  . Colon polyps   . Complication of anesthesia   . COPD (chronic obstructive pulmonary disease) ([place])   . Depression    severe.  dx 1985  . DVT (deep venous thrombosis) ([place])    sees Dr. [name]   . GERD (gastroesophageal reflux disease)   . History of kidney stones    has kidney stone now.    . Hypogonadism male    sees Dr. Heather Roberts at [facility]   . Lupus (systemic lupus erythematosus) (HCC)    sees Dr. Lorenza Cambridge at [facility]   . Plantar fasciitis    rt foot  . PONV (postoperative nausea and  vomiting)   . Renal atrophy, left    sees Dr. Heather Roberts at [facility]   . Sleep apnea    tested 2010     Social History   Socioeconomic History  . Marital status: Single    Spouse name: Not on file  . Number of children: Not on file  . Years of education: Not on file  . Highest education level: Not on file  Occupational History  . Not on file  Tobacco Use  . Smoking status: Never Smoker  . Smokeless tobacco: Never Used  . Tobacco comment: NEVER USED TOBACCO  Substance and Sexual Activity  . Alcohol use: No    Alcohol/week: 0.0 standard drinks  . Drug use: No  . Sexual activity: Not on file  Other Topics Concern  . Not on file  Social History Narrative  . Not on file   Social Determinants of Health   Financial Resource Strain:   . Difficulty of Paying Living Expenses:   Food Insecurity:   . Worried About Charity fundraiser in the Last Year:   . Arboriculturist in the Last Year:   Transportation Needs:   . Film/video editor (Medical):   [name] Lack of Transportation (Non-Medical):   Physical Activity:   .  Days of Exercise per Week:   . Minutes of Exercise per Session:   Stress:   . Feeling of Stress :   Social Connections:   . Frequency of Communication with Friends and Family:   . Frequency of Social Gatherings with Friends and Family:   . Attends Religious Services:   . Active Member of Clubs or Organizations:   . Attends Archivist Meetings:   [name] Marital Status:   Intimate Partner Violence:   . Fear of Current or Ex-Partner:   . Emotionally Abused:   [name] Physically Abused:   . Sexually Abused:    Family History  Problem Relation Age of Onset  . Heart disease Other        parents  . Hyperlipidemia Other   . Stroke Other        grandparents  . Sudden death Other        uncle less than 33 yrs old  . Heart disease Father     Review of Systems  Constitutional: Positive for unexpected weight change. Negative for fever.  HENT: Positive  for dental problem. Negative for congestion, ear pain, nosebleeds, postnasal drip, rhinorrhea, sinus pressure, sneezing, sore throat and trouble swallowing.   Eyes: Negative for redness and itching.  Respiratory: Positive for cough and shortness of breath. Negative for chest tightness and wheezing.   Cardiovascular: Negative for palpitations and leg swelling.  Gastrointestinal: Negative for nausea and vomiting.  Genitourinary: Negative for dysuria.  Musculoskeletal: Negative for joint swelling.  Skin: Negative for rash.  Allergic/Immunologic: Negative.  Negative for environmental allergies, food allergies and immunocompromised state.  Neurological: Negative for headaches.  Hematological: Does not bruise/bleed easily.  Psychiatric/Behavioral: Positive for dysphoric mood. The patient is nervous/anxious.       Objective:   Physical Exam Constitutional:      Appearance: He is obese.  HENT:     Head: Normocephalic.     Right Ear: Tympanic membrane normal.     Mouth/Throat:     Mouth: Mucous membranes are moist.  Eyes:     Extraocular Movements: Extraocular movements intact.     Pupils: Pupils are equal, round, and reactive to light.  Cardiovascular:     Rate and Rhythm: Normal rate and regular rhythm.     Pulses: Normal pulses.     Heart sounds: No murmur. No friction rub.  Pulmonary:     Effort: Pulmonary effort is normal. No respiratory distress.     Breath sounds: Normal breath sounds. No stridor. No wheezing or rhonchi.  Musculoskeletal:     Cervical back: Normal range of motion and neck supple. No tenderness.  Neurological:     Mental Status: He is alert.    Vitals:   11/01/19 0905  BP: 124/70  Pulse: 77  Temp: 97.7 F (36.5 C)  SpO2: 94%   Results of the Epworth flowsheet 09/25/2019  Sitting and reading 2  Watching TV 2  Sitting, inactive in a public place (e.g. a theatre or a meeting) 1  As a passenger in a car for an hour without a break 1  Lying down to rest in  the afternoon when circumstances permit 2  Sitting and talking to someone 0  Sitting quietly after a lunch without alcohol 1  In a car, while stopped for a few minutes in traffic 0  Total score 9   Sleep study results shows severe obstructive sleep apnea    Assessment & Plan:  .  Severe obstructive sleep apnea -  Machine is dated -Does not have a smart card  .  History of asthma -Only on albuterol  .  Protracted cough -Persistent  Plan .  We will repeat home sleep study .  Add Advair 251 puff twice daily .  Continue other lines of care .  Follow-up in 3 months  .  Continue using current CPAP

## 2019-11-01 NOTE — Patient Instructions (Signed)
Obstructive sleep apnea -We will repeat your home sleep study  Uncontrolled asthma -Add Advair 1 puff twice daily, Advair 250  We will see you back in the office in about 4 weeks  Call with significant concerns

## 2019-11-06 ENCOUNTER — Encounter: Payer: Self-pay | Admitting: Family

## 2019-11-06 ENCOUNTER — Inpatient Hospital Stay (HOSPITAL_BASED_OUTPATIENT_CLINIC_OR_DEPARTMENT_OTHER): Payer: Medicare Other | Admitting: Family

## 2019-11-06 ENCOUNTER — Inpatient Hospital Stay: Payer: Medicare Other | Attending: Family

## 2019-11-06 ENCOUNTER — Other Ambulatory Visit: Payer: Self-pay

## 2019-11-06 VITALS — BP 114/74 | HR 80 | Temp 97.1°F | Resp 18 | Ht 72.0 in | Wt 258.0 lb

## 2019-11-06 DIAGNOSIS — Z79899 Other long term (current) drug therapy: Secondary | ICD-10-CM | POA: Insufficient documentation

## 2019-11-06 DIAGNOSIS — Z7982 Long term (current) use of aspirin: Secondary | ICD-10-CM | POA: Diagnosis not present

## 2019-11-06 DIAGNOSIS — G5763 Lesion of plantar nerve, bilateral lower limbs: Secondary | ICD-10-CM | POA: Insufficient documentation

## 2019-11-06 DIAGNOSIS — I82401 Acute embolism and thrombosis of unspecified deep veins of right lower extremity: Secondary | ICD-10-CM

## 2019-11-06 DIAGNOSIS — I82511 Chronic embolism and thrombosis of right femoral vein: Secondary | ICD-10-CM | POA: Insufficient documentation

## 2019-11-06 DIAGNOSIS — Z7901 Long term (current) use of anticoagulants: Secondary | ICD-10-CM | POA: Diagnosis not present

## 2019-11-06 DIAGNOSIS — I82531 Chronic embolism and thrombosis of right popliteal vein: Secondary | ICD-10-CM | POA: Insufficient documentation

## 2019-11-06 DIAGNOSIS — N289 Disorder of kidney and ureter, unspecified: Secondary | ICD-10-CM | POA: Insufficient documentation

## 2019-11-06 DIAGNOSIS — I82811 Embolism and thrombosis of superficial veins of right lower extremities: Secondary | ICD-10-CM | POA: Insufficient documentation

## 2019-11-06 LAB — CMP (CANCER CENTER ONLY)
ALT: 18 U/L (ref 0–44)
AST: 18 U/L (ref 15–41)
Albumin: 4.2 g/dL (ref 3.5–5.0)
Alkaline Phosphatase: 75 U/L (ref 38–126)
Anion gap: 5 (ref 5–15)
BUN: 18 mg/dL (ref 8–23)
CO2: 25 mmol/L (ref 22–32)
Calcium: 9.4 mg/dL (ref 8.9–10.3)
Chloride: 112 mmol/L — ABNORMAL HIGH (ref 98–111)
Creatinine: 1.87 mg/dL — ABNORMAL HIGH (ref 0.61–1.24)
GFR, Est AFR Am: 42 mL/min — ABNORMAL LOW (ref >60)
GFR, Estimated: 36 mL/min — ABNORMAL LOW (ref >60)
Glucose, Bld: 107 mg/dL — ABNORMAL HIGH (ref 70–99)
Potassium: 4.4 mmol/L (ref 3.5–5.1)
Sodium: 142 mmol/L (ref 135–145)
Total Bilirubin: 0.6 mg/dL (ref 0.3–1.2)
Total Protein: 6.5 g/dL (ref 6.5–8.1)

## 2019-11-06 LAB — CBC WITH DIFFERENTIAL (CANCER CENTER ONLY)
Abs Immature Granulocytes: 0.02 10*3/uL (ref 0.00–0.07)
Basophils Absolute: 0.1 10*3/uL (ref 0.0–0.1)
Basophils Relative: 2 %
Eosinophils Absolute: 0.2 10*3/uL (ref 0.0–0.5)
Eosinophils Relative: 4 %
HCT: 44.9 % (ref 39.0–52.0)
Hemoglobin: 14.9 g/dL (ref 13.0–17.0)
Immature Granulocytes: 0 %
Lymphocytes Relative: 21 %
Lymphs Abs: 1.1 10*3/uL (ref 0.7–4.0)
MCH: 30.8 pg (ref 26.0–34.0)
MCHC: 33.2 g/dL (ref 30.0–36.0)
MCV: 93 fL (ref 80.0–100.0)
Monocytes Absolute: 0.4 10*3/uL (ref 0.1–1.0)
Monocytes Relative: 7 %
Neutro Abs: 3.4 10*3/uL (ref 1.7–7.7)
Neutrophils Relative %: 66 %
Platelet Count: 199 10*3/uL (ref 150–400)
RBC: 4.83 MIL/uL (ref 4.22–5.81)
RDW: 12.3 % (ref 11.5–15.5)
WBC Count: 5.2 10*3/uL (ref 4.0–10.5)
nRBC: 0 % (ref 0.0–0.2)

## 2019-11-06 NOTE — Progress Notes (Signed)
Hematology and Oncology Follow Up Visit  Joseph Tran [id number] 09/24/1950 69 y.o. 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 He is doing fairly well but has had asthma exacerbation due to the pollen. He just received a new inhaler in the mail today.  He is doing well onmaintenance Xarelto and 2 baby aspirin daily.  No issues with bleeding. He does bruise but not in excess. No petechiae.  No fever, chills, n/v, cough, rash, dizziness, SOB, chest pain, palpitations, abdominal pain or changes in bowel or bladder habits.  He has slight swelling at times in his left leg where he had the DVT. I suggested he try wearing a compression stocking during the day to minimize this. No redness or edema present. Pedal pulses are 2+.  He has numbness and tingling in the balls of both feet due to bilateral morton's neuromas.  No falls or syncopal episodes to report.  He has maintained a good appetite and is staying well hydrated. Her weight is stable.   ECOG Performance Status: 1 - Symptomatic but completely ambulatory  Medications:  Allergies as of 11/06/2019      Reactions   Allopurinol Other (See Comments)   PATIENT PREFERENCE Pt refused due to mom developing steven johnson syndrome.   Penicillins Hives   Has patient had a PCN reaction causing immediate rash, facial/tongue/throat swelling, SOB or lightheadedness with hypotension: Unknown Has patient had a PCN reaction causing severe rash involving mucus membranes or skin necrosis:Unknown Has patient had a PCN reaction that required hospitalization: No Has patient had a PCN reaction occurring within the last 10 years: No If all of the above answers are "NO", then may proceed with Cephalosporin use.   Sulfamethoxazole Hives   Sulfonamide  Derivatives Hives   Dilaudid [hydromorphone Hcl] Nausea And Vomiting      Medication List       Accurate as of Nov 06, 2019 11:27 AM. If you have any questions, ask your nurse or doctor.        acetaminophen 500 MG tablet Commonly known as: TYLENOL Take 1,000 mg by mouth 2 (two) times a day.   albuterol 108 (90 Base) MCG/ACT inhaler Commonly known as: ProAir HFA INHALE 2 PUFFS EVERY 4  HOURS AS NEEDED FOR  WHEEZING OR SHORTNESS OF  BREATH   aspirin EC 81 MG tablet Take 162 mg by mouth daily.   buPROPion 300 MG 24 hr tablet Commonly known as: WELLBUTRIN XL TAKE 1 TABLET BY MOUTH  DAILY   clonazePAM 1 MG tablet Commonly known as: KLONOPIN TAKE 1 TABLET BY MOUTH  TWICE A DAY AS NEEDED FOR  ANXIETY   desvenlafaxine 50 MG 24 hr tablet Commonly known as: PRISTIQ TAKE 1 TABLET BY MOUTH  DAILY   finasteride 5 MG tablet Commonly known as: PROSCAR Take 5 mg by mouth every morning.   Fluticasone-Salmeterol 250-50 MCG/DOSE Aepb Commonly known as: Advair Diskus Inhale 1 puff into the lungs in the morning and at bedtime.   furosemide 20 MG tablet Commonly known as: LASIX Take 1 tablet (20 mg total) by mouth daily.   GAMMA AMINOBUTYRIC ACID PO Take 750 mg by mouth daily. GABA   hydroxypropyl methylcellulose / hypromellose 2.5 % ophthalmic solution Commonly known as: ISOPTO TEARS / [place]  1-2 drops into both eyes 3 (three) times daily as needed for dry eyes ((SCHEDULED EACH MORNING)).   Melatonin 3 MG Tbdp Take 3 mg by mouth at bedtime.   multivitamin with minerals Tabs tablet Take 1 tablet by mouth daily.   predniSONE 10 MG tablet Commonly known as: DELTASONE Take 1 tablet (10 mg total) by mouth 2 (two) times daily with a meal.   Stimulant Laxative 5 MG EC tablet Generic drug: bisacodyl Take 5 mg by mouth at bedtime.   tadalafil 5 MG tablet Commonly known as: Cialis Take 1 tablet (5 mg total) by mouth daily as needed for erectile dysfunction.     tamsulosin 0.4 MG Caps capsule Commonly known as: FLOMAX Take 0.4 mg by mouth at bedtime.   Xarelto 20 MG Tabs tablet Generic drug: rivaroxaban TAKE ONE-HALF TABLET BY  MOUTH DAILY       Allergies:  Allergies  Allergen Reactions  . Allopurinol Other (See Comments)    PATIENT PREFERENCE Pt refused due to mom developing steven johnson syndrome.  [name] Penicillins Hives    Has patient had a PCN reaction causing immediate rash, facial/tongue/throat swelling, SOB or lightheadedness with hypotension: Unknown Has patient had a PCN reaction causing severe rash involving mucus membranes or skin necrosis:Unknown Has patient had a PCN reaction that required hospitalization: No Has patient had a PCN reaction occurring within the last 10 years: No If all of the above answers are "NO", then may proceed with Cephalosporin use.   . Sulfamethoxazole Hives  . Sulfonamide Derivatives Hives  . Dilaudid [Hydromorphone Hcl] Nausea And Vomiting    Past Medical History, Surgical history, Social history, and Family History were reviewed and updated.  Review of Systems: All other 10 point review of systems is negative.   Physical Exam:  vitals were not taken for this visit.   Wt Readings from Last 3 Encounters:  11/01/19 260 lb 12.8 oz (118.3 kg)  09/25/19 262 lb 9.6 oz (119.1 kg)  07/09/19 257 lb 6.4 oz (116.8 kg)    Ocular: Sclerae unicteric, pupils equal, round and reactive to light Ear-nose-throat: Oropharynx clear, dentition fair Lymphatic: No cervical or supraclavicular adenopathy Lungs no rales or rhonchi, good excursion bilaterally Heart regular rate and rhythm, no murmur appreciated Abd soft, nontender, positive bowel sounds, no liver or spleen tip palpated on exam, nof luid wave  MSK no focal spinal tenderness, no joint edema Neuro: non-focal, well-oriented, appropriate affect Breasts: Deferred   Lab Results  Component Value Date   WBC 5.2 11/06/2019   HGB 14.9 11/06/2019   HCT  44.9 11/06/2019   MCV 93.0 11/06/2019   PLT 199 11/06/2019   Lab Results  Component Value Date   FERRITIN 116 03/31/2017   IRON 101 03/31/2017   TIBC 286 03/31/2017   UIBC 185 03/31/2017   IRONPCTSAT 35 03/31/2017   Lab Results  Component Value Date   RBC 4.83 11/06/2019   No results found for: KPAFRELGTCHN, LAMBDASER, KAPLAMBRATIO No results found for: IGGSERUM, IGA, IGMSERUM No results found for: Odetta Pink, SPEI   Chemistry      Component Value Date/Time   NA 140 07/09/2019 1459   NA 145 06/29/2017 1452   K 4.4 07/09/2019 1459   K 4.7 06/29/2017 1452   CL 107 07/09/2019 1459   CL 107 06/29/2017 1452   CO2 26 07/09/2019 1459   CO2 25 06/29/2017 1452   BUN 24 (H) 07/09/2019 1459   BUN 33 (H)  06/29/2017 1452   CREATININE 1.79 (H) 07/09/2019 1459   CREATININE 2.1 (H) 06/29/2017 1452      Component Value Date/Time   CALCIUM 9.5 07/09/2019 1459   CALCIUM 9.5 06/29/2017 1452   ALKPHOS 77 07/09/2019 1459   ALKPHOS 83 06/29/2017 1452   AST 15 07/09/2019 1459   ALT 15 07/09/2019 1459   ALT 23 06/29/2017 1452   BILITOT 0.6 07/09/2019 1459       Impression and Plan:  Joseph Tran is a 69 yo gentleman with 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 He will continue his same regimen with 2 baby aspirin in the morning and Xarelto 10 mg PO daily at bedtime.  We will see him again in another 6 months. He will contact our office with any questions or concerns. We can certainly see him sooner if needed.   Laverna Peace, NP 5/4/202111:27 AM

## 2019-11-08 ENCOUNTER — Ambulatory Visit: Payer: Medicare Other

## 2019-11-08 ENCOUNTER — Telehealth: Payer: Self-pay | Admitting: *Deleted

## 2019-11-08 ENCOUNTER — Other Ambulatory Visit: Payer: Self-pay

## 2019-11-08 DIAGNOSIS — G4733 Obstructive sleep apnea (adult) (pediatric): Secondary | ICD-10-CM | POA: Diagnosis not present

## 2019-11-08 NOTE — Telephone Encounter (Signed)
Message received from patient wanting to know if it is safe for him to use the elliptical with his history of blood clots.  Jory Ee NP notified.  Call placed back to patient and patient notified per order of S. [place] NP that it is ok for him to use the elliptical.  Pt appreciative of call back and has no further questions or concerns at this time.

## 2019-11-11 ENCOUNTER — Other Ambulatory Visit: Payer: Self-pay | Admitting: Family Medicine

## 2019-11-15 ENCOUNTER — Telehealth: Payer: Self-pay | Admitting: Pulmonary Disease

## 2019-11-15 DIAGNOSIS — G4733 Obstructive sleep apnea (adult) (pediatric): Secondary | ICD-10-CM | POA: Diagnosis not present

## 2019-11-15 NOTE — Telephone Encounter (Signed)
Dr. Ander Slade has reviewed the home sleep test this showed severe obstructive sleep apnea and moderate oxygen desaturation.   Recommendations   Recommend CPAP therapy for severe obstructive sleep apnea  Treatment options are CPAP with the settings auto titrating pressure settings of 5-20 cm H2O.    Weight loss measures encouraged.  Advise against driving while sleepy & against medication with sedative side effects.    Make appointment for 3 months for compliance with download with Dr. Ander Slade.

## 2019-12-11 ENCOUNTER — Encounter: Payer: Self-pay | Admitting: Internal Medicine

## 2019-12-12 ENCOUNTER — Other Ambulatory Visit: Payer: Self-pay

## 2019-12-12 ENCOUNTER — Ambulatory Visit (INDEPENDENT_AMBULATORY_CARE_PROVIDER_SITE_OTHER): Payer: Medicare Other | Admitting: Pulmonary Disease

## 2019-12-12 ENCOUNTER — Encounter: Payer: Self-pay | Admitting: Pulmonary Disease

## 2019-12-12 VITALS — BP 118/82 | HR 72 | Temp 98.6°F | Ht 72.0 in | Wt 255.8 lb

## 2019-12-12 DIAGNOSIS — R05 Cough: Secondary | ICD-10-CM | POA: Diagnosis not present

## 2019-12-12 DIAGNOSIS — G4733 Obstructive sleep apnea (adult) (pediatric): Secondary | ICD-10-CM | POA: Diagnosis not present

## 2019-12-12 DIAGNOSIS — Z9989 Dependence on other enabling machines and devices: Secondary | ICD-10-CM

## 2019-12-12 DIAGNOSIS — R059 Cough, unspecified: Secondary | ICD-10-CM

## 2019-12-12 NOTE — Patient Instructions (Signed)
Obstructive sleep apnea  Your compliance indicates good effective treatment  Call your insurance to find out whether there is an alternate coverage for Advair What ever is cheaper for you will be any better interest  We will follow-up with you in about 6 months   Call with significant concerns

## 2019-12-12 NOTE — Progress Notes (Signed)
Subjective:    Patient ID: Joseph Tran, male    DOB: Dec 10, 1950, 69 y.o.   MRN: [id number]  Patient diagnosed with obstructive sleep apnea  Sleep apnea was diagnosed in 2010 He has been using CPAP on a regular basis Diagnosed with severe obstructive sleep apnea Has a new machine and is tolerating this well  Cough is better with use of Advair Advair turns out to be very expensive with a bill of $1200-preferred medication on this plan  Usually goes to bed between 11 and 12 AM Takes him about 15 minutes to fall asleep 1-2 awakenings  Sleep is usually restorative Usually wakes up slowly in the mornings Never smoker He had knee replacement about July of last year  He does have some dryness in his mouth No headaches Memory is fair Sister has possible OSA undiagnosed  Cough is better with use of Advair  Past Medical History:  Diagnosis Date  . Acoustic neuroma (HCC)    benign - left  . Anemia   . Anxiety   . Arthritis   . Asthma    seasonal, when pollen is high, cold air closes me up  . Cancer ([place])    skin cancer -- arm, scalp, upper back  . Chronic kidney disease    sees Dr. 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  right kidny is at 50%  . Colon polyps   . Complication of anesthesia   . COPD (chronic obstructive pulmonary disease) ([place])   . Depression    severe.  dx 1985  . DVT (deep venous thrombosis) ([place])    sees Dr. [name]   . GERD (gastroesophageal reflux disease)   . History of kidney stones    has kidney stone now.    . Hypogonadism male    sees Dr. Heather Roberts at [facility]   . Lupus (systemic lupus erythematosus) (HCC)    sees Dr. Lorenza Cambridge at [facility] Rheumatology   . Plantar fasciitis    rt foot  . PONV (postoperative nausea and vomiting)   . Renal atrophy, left    sees Dr. Heather Roberts at [facility] Urology   . Sleep apnea    tested 2010     Social History   Socioeconomic History  . Marital status: Single   Spouse name: Not on file  . Number of children: Not on file  . Years of education: Not on file  . Highest education level: Not on file  Occupational History  . Not on file  Tobacco Use  . Smoking status: Never Smoker  . Smokeless tobacco: Never Used  . Tobacco comment: NEVER USED TOBACCO  Substance and Sexual Activity  . Alcohol use: No    Alcohol/week: 0.0 standard drinks  . Drug use: No  . Sexual activity: Not on file  Other Topics Concern  . Not on file  Social History Narrative  . Not on file   Social Determinants of Health   Financial Resource Strain:   . Difficulty of Paying Living Expenses:   Food Insecurity:   . Worried About Charity fundraiser in the Last Year:   . Arboriculturist in the Last Year:   Transportation Needs:   . Film/video editor (Medical):   [name] Lack of Transportation (Non-Medical):   Physical Activity:   . Days of Exercise per Week:   . Minutes of Exercise per Session:   Stress:   . Feeling of Stress :  Social Connections:   . Frequency of Communication with Friends and Family:   . Frequency of Social Gatherings with Friends and Family:   . Attends Religious Services:   . Active Member of Clubs or Organizations:   . Attends Archivist Meetings:   [name] Marital Status:   Intimate Partner Violence:   . Fear of Current or Ex-Partner:   . Emotionally Abused:   [name] Physically Abused:   . Sexually Abused:    Family History  Problem Relation Age of Onset  . Heart disease Other        parents  . Hyperlipidemia Other   . Stroke Other        grandparents  . Sudden death Other        uncle less than 105 yrs old  . Heart disease Father     Review of Systems  Constitutional: Positive for unexpected weight change. Negative for fever.  HENT: Positive for dental problem. Negative for congestion and ear pain.   Eyes: Negative for redness and itching.  Respiratory: Positive for cough and shortness of breath. Negative for chest tightness  and wheezing.   Skin: Negative for rash.  Allergic/Immunologic: Negative.       Objective:   Physical Exam Constitutional:      Appearance: He is obese.  HENT:     Head: Normocephalic.     Mouth/Throat:     Mouth: Mucous membranes are moist.  Cardiovascular:     Rate and Rhythm: Normal rate and regular rhythm.     Pulses: Normal pulses.     Heart sounds: No murmur. No friction rub.  Pulmonary:     Effort: Pulmonary effort is normal. No respiratory distress.     Breath sounds: Normal breath sounds. No stridor. No wheezing or rhonchi.  Musculoskeletal:     Cervical back: Normal range of motion and neck supple. No tenderness.  Neurological:     Mental Status: He is alert.    Vitals:   12/12/19 0857  BP: 118/82  Pulse: 72  Temp: 98.6 F (37 C)  SpO2: 96%   Results of the Epworth flowsheet 09/25/2019  Sitting and reading 2  Watching TV 2  Sitting, inactive in a public place (e.g. a theatre or a meeting) 1  As a passenger in a car for an hour without a break 1  Lying down to rest in the afternoon when circumstances permit 2  Sitting and talking to someone 0  Sitting quietly after a lunch without alcohol 1  In a car, while stopped for a few minutes in traffic 0  Total score 9   Sleep study results shows severe obstructive sleep apnea    Compliance data reveals excellent compliance Average use of 7 hours 7 minutes AutoSet 5-20 AHI 1.7  Assessment & Plan:  .  Severe obstructive sleep apnea -Has a new machine -Has always been very compliant -AHI 1.7  .  History of asthma -Only on albuterol  .  Protracted cough -Cough is better with use of Advair  Plan .  Continue CPAP on a regular basis .  Continue Advair use .  Encouraged to call his insurance company to find out if there is a preferred medication that would be cheaper than Advair .  Encouraged to call with any significant concerns .  Follow-up in 6 months

## 2020-01-25 ENCOUNTER — Encounter: Payer: Self-pay | Admitting: *Deleted

## 2020-01-28 ENCOUNTER — Telehealth: Payer: Self-pay | Admitting: *Deleted

## 2020-01-28 ENCOUNTER — Encounter: Payer: Self-pay | Admitting: *Deleted

## 2020-01-28 DIAGNOSIS — M109 Gout, unspecified: Secondary | ICD-10-CM | POA: Diagnosis not present

## 2020-01-28 DIAGNOSIS — D649 Anemia, unspecified: Secondary | ICD-10-CM | POA: Diagnosis not present

## 2020-01-28 DIAGNOSIS — N189 Chronic kidney disease, unspecified: Secondary | ICD-10-CM | POA: Diagnosis not present

## 2020-01-28 DIAGNOSIS — N183 Chronic kidney disease, stage 3 unspecified: Secondary | ICD-10-CM | POA: Diagnosis not present

## 2020-01-28 DIAGNOSIS — I10 Essential (primary) hypertension: Secondary | ICD-10-CM | POA: Diagnosis not present

## 2020-01-28 NOTE — Telephone Encounter (Signed)
-----   Message from Osvaldo Angst, CRNA sent at 01/28/2020  9:28 AM EDT ----- Regarding: Anesthesia Joseph Tran,  He has post-op nausea and vomiting, PONV.  This pt is cleared for anesthetic care at [facility].   Thanks much,  Osvaldo Angst ----- Message ----- From: Larina Bras, CMA Sent: 01/25/2020  12:10 PM EDT To: Osvaldo Angst, CRNA  Can you see if this patient has true anesthesia issue? There is a note placed by RN in 2018 that patient has "complication of anesthesia" but Im unsure what that complication is. Would patient need hospital procedure?  Thx Dottie

## 2020-02-16 ENCOUNTER — Other Ambulatory Visit: Payer: Self-pay | Admitting: Family Medicine

## 2020-02-27 ENCOUNTER — Telehealth: Payer: Self-pay | Admitting: *Deleted

## 2020-02-27 ENCOUNTER — Encounter: Payer: Self-pay | Admitting: Internal Medicine

## 2020-02-27 ENCOUNTER — Ambulatory Visit (INDEPENDENT_AMBULATORY_CARE_PROVIDER_SITE_OTHER): Payer: Medicare Other | Admitting: Internal Medicine

## 2020-02-27 VITALS — BP 126/70 | HR 75 | Ht 72.0 in | Wt 250.0 lb

## 2020-02-27 DIAGNOSIS — Z7901 Long term (current) use of anticoagulants: Secondary | ICD-10-CM

## 2020-02-27 DIAGNOSIS — K5909 Other constipation: Secondary | ICD-10-CM

## 2020-02-27 DIAGNOSIS — Z1211 Encounter for screening for malignant neoplasm of colon: Secondary | ICD-10-CM | POA: Diagnosis not present

## 2020-02-27 DIAGNOSIS — K649 Unspecified hemorrhoids: Secondary | ICD-10-CM | POA: Diagnosis not present

## 2020-02-27 DIAGNOSIS — K602 Anal fissure, unspecified: Secondary | ICD-10-CM

## 2020-02-27 MED ORDER — DILTIAZEM GEL 2 %: CUTANEOUS | 0 refills | Status: DC

## 2020-02-27 MED ORDER — SUTAB 1479-225-188 MG PO TABS: 1.0000 | ORAL_TABLET | ORAL | 0 refills | Status: DC

## 2020-02-27 NOTE — Telephone Encounter (Signed)
Manually faxed anticoagulation clearance request to fax [phone]. (phone if needed- [phone]).

## 2020-02-27 NOTE — Patient Instructions (Addendum)
You have been scheduled for a colonoscopy. Please follow written instructions given to you at your visit today.  Please pick up your prep supplies at the pharmacy within the next 1-3 days. If you use inhalers (even only as needed), please bring them with you on the day of your procedure. ______________________________________ We have sent a prescription for Diltiazem 2% gel to [facility] for you. Using your index finger, you should apply a small amount of medication inside the rectum up to your first knuckle/joint twice daily x 4 weeks. You may mix this medication with over the counter recticare (ask the pharmacist to show you where this medication is)  [facility] information is below: Address: [address] [place], [place] [address]  [phone]  *Please DO NOT go directly from our office to pick up this medication! Give the pharmacy 1 day to process the prescription as this is compounded and takes time to make.  ______________________________________  Continue your laxative regimen. _______________________________________  Due to recent changes in healthcare laws, you may see the results of your imaging and laboratory studies on MyChart before your provider has had a chance to review them.  We understand that in some cases there may be results that are confusing or concerning to you. Not all laboratory results come back in the same time frame and the provider may be waiting for multiple results in order to interpret others.  Please give [place] 48 hours in order for your provider to thoroughly review all the results before contacting the office for clarification of your results.  ____________________________________________ If you are age 63 or older, your body mass index should be between 23-30. Your Body mass index is 33.91 kg/m. If this is out of the aforementioned range listed, please consider follow up with your Primary Care Provider.  If you are age 62 or  younger, your body mass index should be between 19-25. Your Body mass index is 33.91 kg/m. If this is out of the aformentioned range listed, please consider follow up with your Primary Care Provider.

## 2020-02-27 NOTE — Telephone Encounter (Signed)
   Joseph Tran 1950-11-05 [id number]  Dear Dr [name]:  We have scheduled the above named patient for a(n) colonoscopy procedure. Our records show that (s)he is on anticoagulation therapy.  Please advise as to whether the patient may come off their therapy of Xarelto 2 days prior to their procedure which is scheduled for 04/07/20.  Please route your response to Dixon Boos, [place] or fax response to [phone].  Sincerely,    [facility]

## 2020-02-27 NOTE — Progress Notes (Signed)
HPI: Joseph Tran is a 69 year old male with a past medical history of constipation, hemorrhoids and anal fissures, DVT on Xarelto, anxiety and depression, chronic kidney disease, hypertension, obesity, sleep apnea who is seen to discuss screening colonoscopy but also to discuss hemorrhoids and anal fissure.  He is here today alone.  He was seen today with Lytle Michaels, medical student.  He reports that from a GI perspective he is doing well.  He does have a history of constipation which she is currently treating with daily Metamucil, Colace and 5 mg of bisacodyl.  With this combination which he takes in the evening he is having daily bowel movements which are soft and complete.  He does have a history of both hemorrhoids and anal fissures.  Anal fissures can be very painful and he has used Neosporin to help with these at times.  He does note that they take "forever" to heal.  He has tried Preparation H for hemorrhoid symptoms which are intermittent but this does not really help.  He does have a history of a thrombosed external hemorrhoid which was exquisitely painful.  He has no upper GI or hepatobiliary complaint today.    Past Medical History:  Diagnosis Date  . Acoustic neuroma (HCC)    benign - left  . Anemia   . Anxiety   . Arthritis   . Asthma    seasonal, when pollen is high, cold air closes me up  . Cancer ([place])    skin cancer -- arm, scalp, upper back  . Chronic kidney disease    sees Dr. 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  right kidny is at 50%  . Colon polyps   . Complication of anesthesia    Post op nausea/vomiting  . COPD (chronic obstructive pulmonary disease) ([place])   . Depression    severe.  dx 1985  . DVT (deep venous thrombosis) ([place])    sees Dr. [name]   . GERD (gastroesophageal reflux disease)   . History of kidney stones    has kidney stone now.    . Hypogonadism male    sees Dr. Heather Roberts at [facility]   . Lupus (systemic lupus  erythematosus) (HCC)    sees Dr. Lorenza Cambridge at [facility] Rheumatology   . OSA (obstructive sleep apnea)   . Plantar fasciitis    rt foot  . PONV (postoperative nausea and vomiting)   . Renal atrophy, left    sees Dr. Heather Roberts at [facility]rology   . Sleep apnea    tested 2010      Past Surgical History:  Procedure Laterality Date  . CHEST WALL TUMOR EXCISION  2007  . COLONOSCOPY  10/2009   in [place], clear, repeat in 10 yrs   . kidney caluculs  2004-05  . KNEE ARTHROSCOPY  09-10-11   right knee, per Dr. Dorna Leitz   . KNEE ARTHROSCOPY Right 04/25/2017   Procedure: RIGHT KNEE ARTHROSCOPY, PARTIAL LATERAL MENISECTOMY, CHONDROPLASTY MEDIAL AND LATERAL PATELLAOFEMORAL;  Surgeon: Dorna Leitz, MD;  Location: [place];  Service: Orthopedics;  Laterality: Right;  . madiscus cartilage  2009  . right knee arthroscopy  2008  . TONSILLECTOMY    . TOTAL KNEE ARTHROPLASTY Right 01/19/2019   Procedure: RIGHT TOTAL KNEE ARTHROPLASTY;  Surgeon: Dorna Leitz, MD;  Location: [facility];  Service: Orthopedics;  Laterality: Right;    Outpatient Medications Prior to Visit  Medication Sig Dispense Refill  . acetaminophen (TYLENOL) 500 MG tablet Take  1,000 mg by mouth as needed.     [name] albuterol (PROAIR HFA) 108 (90 Base) MCG/ACT inhaler INHALE 2 PUFFS EVERY 4  HOURS AS NEEDED FOR  WHEEZING OR SHORTNESS OF  BREATH 51 g 0  . aspirin EC 81 MG tablet Take 162 mg by mouth daily.    . B Complex Vitamins (VITAMIN B COMPLEX PO) Take 1 capsule by mouth daily.    . bisacodyl (STIMULANT LAXATIVE) 5 MG EC tablet Take 5 mg by mouth at bedtime.    [name] buPROPion (WELLBUTRIN XL) 300 MG 24 hr tablet TAKE 1 TABLET BY MOUTH  DAILY 90 tablet 0  . clonazePAM (KLONOPIN) 1 MG tablet TAKE 1 TABLET BY MOUTH  TWICE A DAY AS NEEDED FOR  ANXIETY 180 tablet 1  . Desvenlafaxine Succinate ER (PRISTIQ) 25 MG TB24 Take 1 tablet by mouth daily.    . finasteride (PROSCAR) 5 MG tablet Take 5 mg by mouth every morning.     . Fluticasone-Salmeterol  (ADVAIR DISKUS) 250-50 MCG/DOSE AEPB Inhale 1 puff into the lungs in the morning and at bedtime. 60 each 5  . furosemide (LASIX) 20 MG tablet Take 1 tablet (20 mg total) by mouth daily. 30 tablet 3  . GAMMA AMINOBUTYRIC ACID PO Take 750 mg by mouth daily. GABA    . hydroxypropyl methylcellulose / hypromellose (ISOPTO TEARS / GONIOVISC) 2.5 % ophthalmic solution Place 1-2 drops into both eyes 3 (three) times daily as needed for dry eyes ((SCHEDULED EACH MORNING)).    . Melatonin 3 MG TBDP Take 3 mg by mouth at bedtime.    . Multiple Vitamin (MULTIVITAMIN WITH MINERALS) TABS tablet Take 1 tablet by mouth daily.    . tadalafil (CIALIS) 5 MG tablet Take 1 tablet (5 mg total) by mouth daily as needed for erectile dysfunction. 10 tablet 0  . tamsulosin (FLOMAX) 0.4 MG CAPS Take 0.4 mg by mouth at bedtime.     . traMADol (ULTRAM) 50 MG tablet Take 50 mg by mouth every 6 (six) hours as needed.    Alveda Reasons 20 MG TABS tablet TAKE ONE-HALF TABLET BY  MOUTH DAILY 45 tablet 3   No facility-administered medications prior to visit.    Allergies  Allergen Reactions  . Allopurinol Other (See Comments)    PATIENT PREFERENCE Pt refused due to mom developing steven johnson syndrome.  [name] Penicillins Hives    Has patient had a PCN reaction causing immediate rash, facial/tongue/throat swelling, SOB or lightheadedness with hypotension: Unknown Has patient had a PCN reaction causing severe rash involving mucus membranes or skin necrosis:Unknown Has patient had a PCN reaction that required hospitalization: No Has patient had a PCN reaction occurring within the last 10 years: No If all of the above answers are "NO", then may proceed with Cephalosporin use.   . Sulfamethoxazole Hives  . Sulfonamide Derivatives Hives  . Dilaudid [Hydromorphone Hcl] Nausea And Vomiting    Family History  Problem Relation Age of Onset  . Heart disease Other        parents  . Hyperlipidemia Other   . Stroke Other         grandparents  . Sudden death Other        uncle less than 74 yrs old  . Heart disease Father     Social History   Tobacco Use  . Smoking status: Never Smoker  . Smokeless tobacco: Never Used  . Tobacco comment: NEVER USED TOBACCO  Vaping Use  . Vaping Use: Never  used  Substance Use Topics  . Alcohol use: No    Alcohol/week: 0.0 standard drinks  . Drug use: No    ROS: As per history of present illness, otherwise negative  BP 126/70   Pulse 75   Ht 6' (1.829 m)   Wt 250 lb (113.4 kg)   BMI 33.91 kg/m  Gen: awake, alert, NAD HEENT: anicteric CV: RRR, no mrg Pulm: CTA b/l Abd: soft, NT/ND, +BS throughout Ext: no c/c/e Neuro: nonfocal   RELEVANT LABS AND IMAGING: CBC    Component Value Date/Time   WBC 5.2 11/06/2019 1114   WBC 5.1 05/30/2019 0903   RBC 4.83 11/06/2019 1114   HGB 14.9 11/06/2019 1114   HGB 14.1 06/29/2017 1452   HCT 44.9 11/06/2019 1114   HCT 41.7 06/29/2017 1452   PLT 199 11/06/2019 1114   PLT 216 06/29/2017 1452   MCV 93.0 11/06/2019 1114   MCV 94 06/29/2017 1452   MCH 30.8 11/06/2019 1114   MCHC 33.2 11/06/2019 1114   RDW 12.3 11/06/2019 1114   RDW 12.1 06/29/2017 1452   LYMPHSABS 1.1 11/06/2019 1114   LYMPHSABS 1.0 06/29/2017 1452   MONOABS 0.4 11/06/2019 1114   EOSABS 0.2 11/06/2019 1114   EOSABS 0.2 06/29/2017 1452   BASOSABS 0.1 11/06/2019 1114   BASOSABS 0.1 06/29/2017 1452    CMP     Component Value Date/Time   NA 142 11/06/2019 1114   NA 145 06/29/2017 1452   K 4.4 11/06/2019 1114   K 4.7 06/29/2017 1452   CL 112 (H) 11/06/2019 1114   CL 107 06/29/2017 1452   CO2 25 11/06/2019 1114   CO2 25 06/29/2017 1452   GLUCOSE 107 (H) 11/06/2019 1114   GLUCOSE 85 06/29/2017 1452   BUN 18 11/06/2019 1114   BUN 33 (H) 06/29/2017 1452   CREATININE 1.87 (H) 11/06/2019 1114   CREATININE 2.1 (H) 06/29/2017 1452   CALCIUM 9.4 11/06/2019 1114   CALCIUM 9.5 06/29/2017 1452   PROT 6.5 11/06/2019 1114   PROT 6.9 06/29/2017 1452    ALBUMIN 4.2 11/06/2019 1114   ALBUMIN 3.8 06/29/2017 1452   ALBUMIN 4.2 09/20/2016 1441   AST 18 11/06/2019 1114   ALT 18 11/06/2019 1114   ALT 23 06/29/2017 1452   ALKPHOS 75 11/06/2019 1114   ALKPHOS 83 06/29/2017 1452   BILITOT 0.6 11/06/2019 1114   GFRNONAA 36 (L) 11/06/2019 1114   GFRAA 42 (L) 11/06/2019 1114    ASSESSMENT/PLAN: 69 year old male with a past medical history of constipation, hemorrhoids and anal fissures, DVT on Xarelto, anxiety and depression, chronic kidney disease, hypertension, obesity, sleep apnea who is seen to discuss screening colonoscopy but also to discuss hemorrhoids and anal fissure.  1.  Colon cancer screening --last colonoscopy 10 years ago in [place] which was normal.  I recommended we repeat colonoscopy and we discussed the risk, benefits and alternatives and he is agreeable and wishes to proceed.  We will need to hold Xarelto.  Will hold Xarelto 2 days prior to endoscopic procedures - will instruct when and how to resume after procedure. Benefits and risks of procedure explained including risks of bleeding, perforation, infection, missed lesions, reactions to medications and possible need for hospitalization and surgery for complications. Additional rare but real risk of stroke or other vascular clotting events off Xarelto also explained and need to seek urgent help if any signs of these problems occur. Will communicate by phone or EMR with patient's  prescribing provider to confirm that  holding Xarelto is reasonable in this case.   2.  Chronic constipation --currently well managed with daily Metamucil, Colace and low-dose bisacodyl.  We will continue this regimen  3.  History of hemorrhoids and anal fissures --we discussed the difference in pathology between hemorrhoids and fissures today.  For fissures I will prescribe diltiazem and RectiCare to be used when fissure is bothersome or present.  We will evaluate hemorrhoids at colonoscopy and determine if  hemorrhoidal banding may provide more long-lasting treatment. --Diltiazem gel 2%, pea-sized amount applied to the anal canal to the first knuckle twice daily to 3 times daily when fissures present --RectiCare per tube instructions as needed     Cc:Fry, Ishmael Holter, Md [address] [place],  [place] [address]

## 2020-02-29 ENCOUNTER — Telehealth: Payer: Self-pay | Admitting: Family Medicine

## 2020-02-29 NOTE — Progress Notes (Signed)
  Chronic Care Management   Outreach Note  02/29/2020 Name: Joseph Tran MRN: [id number] DOB: [birth year]  Referred by: [name], MD Reason for referral : No chief complaint on file.   An unsuccessful telephone outreach was attempted today. The patient was referred to the pharmacist for assistance with care management and care coordination.   Follow Up Plan:   Carley Perdue [employer] Scheduler

## 2020-02-29 NOTE — Telephone Encounter (Signed)
===  View-only below this line=== ----- Message ----- From: San Morelle, RN Sent: 02/29/2020   3:19 PM EDT To: Larina Bras, CMA Subject: Judithann Graves,  Dr. Marin Olp wanted [place] to let you know that it is ok to stop Xarelto two days before Mr. Dorfman procedure.  Thanks! Lorriane Shire

## 2020-02-29 NOTE — Telephone Encounter (Signed)
I have left a message for patient to call back. 

## 2020-03-03 NOTE — Telephone Encounter (Signed)
Left message for patient to call back  

## 2020-03-04 NOTE — Telephone Encounter (Signed)
I have spoken to patient to advise that per Dr Marin Olp, he may hold his xarelto 2 days prior to his upcoming procedure with Dr Hilarie Fredrickson. Patient verbalizes understanding of this information.

## 2020-03-07 ENCOUNTER — Inpatient Hospital Stay: Payer: Medicare Other

## 2020-03-07 ENCOUNTER — Encounter: Payer: Self-pay | Admitting: Family

## 2020-03-07 ENCOUNTER — Other Ambulatory Visit: Payer: Self-pay

## 2020-03-07 ENCOUNTER — Inpatient Hospital Stay: Payer: Medicare Other | Attending: Family | Admitting: Family

## 2020-03-07 VITALS — BP 124/76 | HR 64 | Temp 98.1°F | Resp 18 | Ht 72.0 in | Wt 252.4 lb

## 2020-03-07 DIAGNOSIS — I82811 Embolism and thrombosis of superficial veins of right lower extremities: Secondary | ICD-10-CM | POA: Diagnosis not present

## 2020-03-07 DIAGNOSIS — I82531 Chronic embolism and thrombosis of right popliteal vein: Secondary | ICD-10-CM | POA: Diagnosis not present

## 2020-03-07 DIAGNOSIS — Z7982 Long term (current) use of aspirin: Secondary | ICD-10-CM | POA: Diagnosis not present

## 2020-03-07 DIAGNOSIS — I82511 Chronic embolism and thrombosis of right femoral vein: Secondary | ICD-10-CM | POA: Insufficient documentation

## 2020-03-07 DIAGNOSIS — I82401 Acute embolism and thrombosis of unspecified deep veins of right lower extremity: Secondary | ICD-10-CM

## 2020-03-07 DIAGNOSIS — G5763 Lesion of plantar nerve, bilateral lower limbs: Secondary | ICD-10-CM | POA: Insufficient documentation

## 2020-03-07 DIAGNOSIS — Z79899 Other long term (current) drug therapy: Secondary | ICD-10-CM | POA: Insufficient documentation

## 2020-03-07 DIAGNOSIS — M7989 Other specified soft tissue disorders: Secondary | ICD-10-CM | POA: Diagnosis not present

## 2020-03-07 DIAGNOSIS — N289 Disorder of kidney and ureter, unspecified: Secondary | ICD-10-CM | POA: Diagnosis not present

## 2020-03-07 DIAGNOSIS — Z7901 Long term (current) use of anticoagulants: Secondary | ICD-10-CM | POA: Insufficient documentation

## 2020-03-07 LAB — CMP (CANCER CENTER ONLY)
ALT: 13 U/L (ref 0–44)
AST: 14 U/L — ABNORMAL LOW (ref 15–41)
Albumin: 3.9 g/dL (ref 3.5–5.0)
Alkaline Phosphatase: 69 U/L (ref 38–126)
Anion gap: 7 (ref 5–15)
BUN: 20 mg/dL (ref 8–23)
CO2: 23 mmol/L (ref 22–32)
Calcium: 9.1 mg/dL (ref 8.9–10.3)
Chloride: 112 mmol/L — ABNORMAL HIGH (ref 98–111)
Creatinine: 1.62 mg/dL — ABNORMAL HIGH (ref 0.61–1.24)
GFR, Est AFR Am: 50 mL/min — ABNORMAL LOW (ref >60)
GFR, Estimated: 43 mL/min — ABNORMAL LOW (ref >60)
Glucose, Bld: 101 mg/dL — ABNORMAL HIGH (ref 70–99)
Potassium: 4.1 mmol/L (ref 3.5–5.1)
Sodium: 142 mmol/L (ref 135–145)
Total Bilirubin: 0.5 mg/dL (ref 0.3–1.2)
Total Protein: 6.2 g/dL — ABNORMAL LOW (ref 6.5–8.1)

## 2020-03-07 LAB — CBC WITH DIFFERENTIAL (CANCER CENTER ONLY)
Abs Immature Granulocytes: 0.06 10*3/uL (ref 0.00–0.07)
Basophils Absolute: 0.1 10*3/uL (ref 0.0–0.1)
Basophils Relative: 2 %
Eosinophils Absolute: 0.4 10*3/uL (ref 0.0–0.5)
Eosinophils Relative: 8 %
HCT: 42.5 % (ref 39.0–52.0)
Hemoglobin: 14.2 g/dL (ref 13.0–17.0)
Immature Granulocytes: 1 %
Lymphocytes Relative: 19 %
Lymphs Abs: 1 10*3/uL (ref 0.7–4.0)
MCH: 31.6 pg (ref 26.0–34.0)
MCHC: 33.4 g/dL (ref 30.0–36.0)
MCV: 94.7 fL (ref 80.0–100.0)
Monocytes Absolute: 0.3 10*3/uL (ref 0.1–1.0)
Monocytes Relative: 6 %
Neutro Abs: 3.6 10*3/uL (ref 1.7–7.7)
Neutrophils Relative %: 64 %
Platelet Count: 172 10*3/uL (ref 150–400)
RBC: 4.49 MIL/uL (ref 4.22–5.81)
RDW: 12.5 % (ref 11.5–15.5)
WBC Count: 5.6 10*3/uL (ref 4.0–10.5)
nRBC: 0 % (ref 0.0–0.2)

## 2020-03-07 LAB — D-DIMER, QUANTITATIVE: D-Dimer, Quant: 0.5 ug/mL-FEU (ref 0.00–0.50)

## 2020-03-07 NOTE — Progress Notes (Signed)
Hematology and Oncology Follow Up Visit  ARMEND HOCHSTATTER [id number] Jan 19, 1951 69 y.o. 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 Calvillo is here today for follow-up. He is doing quite well and enjoying get up early to tend to his horses. He continues to do well on his Xarelto daily. D-dimer is 0.50.  No episodes of bleeding. No bruising or petechiae.  He has intermittent swelling in his right ankle and wears his compression stocking when needed. He has not been wearing it as much this summer due to the heat.  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  No falls or syncope.  He has maintained a good appetite and is staying well hydrated. His weight is stable.   ECOG Performance Status: 1 - Symptomatic but completely ambulatory  Medications:  Allergies as of 03/07/2020      Reactions   Allopurinol Other (See Comments)   PATIENT PREFERENCE Pt refused due to mom developing steven johnson syndrome.   Penicillins Hives   Has patient had a PCN reaction causing immediate rash, facial/tongue/throat swelling, SOB or lightheadedness with hypotension: Unknown Has patient had a PCN reaction causing severe rash involving mucus membranes or skin necrosis:Unknown Has patient had a PCN reaction that required hospitalization: No Has patient had a PCN reaction occurring within the last 10 years: No If all of the above answers are "NO", then may proceed with Cephalosporin use.   Sulfamethoxazole Hives   Sulfonamide Derivatives Hives   Dilaudid [hydromorphone Hcl] Nausea And Vomiting      Medication List       Accurate as of March 07, 2020  9:11 AM. If you have any questions, ask your nurse or doctor.        acetaminophen 500 MG tablet Commonly known as: TYLENOL Take 1,000 mg  by mouth as needed.   albuterol 108 (90 Base) MCG/ACT inhaler Commonly known as: ProAir HFA INHALE 2 PUFFS EVERY 4  HOURS AS NEEDED FOR  WHEEZING OR SHORTNESS OF  BREATH   aspirin EC 81 MG tablet Take 162 mg by mouth daily.   buPROPion 300 MG 24 hr tablet Commonly known as: WELLBUTRIN XL TAKE 1 TABLET BY MOUTH  DAILY   clonazePAM 1 MG tablet Commonly known as: KLONOPIN TAKE 1 TABLET BY MOUTH  TWICE A DAY AS NEEDED FOR  ANXIETY   diltiazem 2 % Gel Apply twice daily to rectum- may mix with OTC recticare   finasteride 5 MG tablet Commonly known as: PROSCAR Take 5 mg by mouth every morning.   Fluticasone-Salmeterol 250-50 MCG/DOSE Aepb Commonly known as: Advair Diskus Inhale 1 puff into the lungs in the morning and at bedtime.   furosemide 20 MG tablet Commonly known as: LASIX Take 1 tablet (20 mg total) by mouth daily.   GAMMA AMINOBUTYRIC ACID PO Take 750 mg by mouth daily. GABA   hydroxypropyl methylcellulose / hypromellose 2.5 % ophthalmic solution Commonly known as: ISOPTO TEARS / GONIOVISC Place 1-2 drops into both eyes 3 (three) times daily as needed for dry eyes ((SCHEDULED EACH MORNING)).   Melatonin 3 MG Tbdp Take 3 mg by mouth at bedtime.   multivitamin with minerals Tabs tablet Take 1 tablet by mouth daily.   Pristiq 25 MG Tb24 Generic drug: Desvenlafaxine Succinate ER Take 1 tablet by mouth daily.  Stimulant Laxative 5 MG EC tablet Generic drug: bisacodyl Take 5 mg by mouth at bedtime.   Sutab [phone] MG Tabs Generic drug: Sodium Sulfate-Mag Sulfate-KCl Take 1 kit by mouth as directed. BIN: [id number] PCN: CN GROUP: [id number] MEMBER ID: [id number]; RUN AS CASH   tadalafil 5 MG tablet Commonly known as: Cialis Take 1 tablet (5 mg total) by mouth daily as needed for erectile dysfunction.   tamsulosin 0.4 MG Caps capsule Commonly known as: FLOMAX Take 0.4 mg by mouth at bedtime.   traMADol 50 MG tablet Commonly known as: ULTRAM Take 50 mg by  mouth every 6 (six) hours as needed.   VITAMIN B COMPLEX PO Take 1 capsule by mouth daily.   Xarelto 20 MG Tabs tablet Generic drug: rivaroxaban TAKE ONE-HALF TABLET BY  MOUTH DAILY       Allergies:  Allergies  Allergen Reactions  . Allopurinol Other (See Comments)    PATIENT PREFERENCE Pt refused due to mom developing steven johnson syndrome.  [name] Penicillins Hives    Has patient had a PCN reaction causing immediate rash, facial/tongue/throat swelling, SOB or lightheadedness with hypotension: Unknown Has patient had a PCN reaction causing severe rash involving mucus membranes or skin necrosis:Unknown Has patient had a PCN reaction that required hospitalization: No Has patient had a PCN reaction occurring within the last 10 years: No If all of the above answers are "NO", then may proceed with Cephalosporin use.   . Sulfamethoxazole Hives  . Sulfonamide Derivatives Hives  . Dilaudid [Hydromorphone Hcl] Nausea And Vomiting    Past Medical History, Surgical history, Social history, and Family History were reviewed and updated.  Review of Systems: All other 10 point review of systems is negative.   Physical Exam:  vitals were not taken for this visit.   Wt Readings from Last 3 Encounters:  02/27/20 250 lb (113.4 kg)  12/12/19 255 lb 12.8 oz (116 kg)  11/06/19 258 lb (117 kg)    Ocular: Sclerae unicteric, pupils equal, round and reactive to light Ear-nose-throat: Oropharynx clear, dentition fair Lymphatic: No cervical or supraclavicular adenopathy Lungs no rales or rhonchi, good excursion bilaterally Heart regular rate and rhythm, no murmur appreciated Abd soft, nontender, positive bowel sounds MSK no focal spinal tenderness, no joint edema Neuro: non-focal, well-oriented, appropriate affect Breasts: Deferred   Lab Results  Component Value Date   WBC 5.6 03/07/2020   HGB 14.2 03/07/2020   HCT 42.5 03/07/2020   MCV 94.7 03/07/2020   PLT 172 03/07/2020   Lab  Results  Component Value Date   FERRITIN 116 03/31/2017   IRON 101 03/31/2017   TIBC 286 03/31/2017   UIBC 185 03/31/2017   IRONPCTSAT 35 03/31/2017   Lab Results  Component Value Date   RBC 4.49 03/07/2020   No results found for: KPAFRELGTCHN, LAMBDASER, KAPLAMBRATIO No results found for: IGGSERUM, IGA, IGMSERUM No results found for: Odetta Pink, SPEI   Chemistry      Component Value Date/Time   NA 142 11/06/2019 1114   NA 145 06/29/2017 1452   K 4.4 11/06/2019 1114   K 4.7 06/29/2017 1452   CL 112 (H) 11/06/2019 1114   CL 107 06/29/2017 1452   CO2 25 11/06/2019 1114   CO2 25 06/29/2017 1452   BUN 18 11/06/2019 1114   BUN 33 (H) 06/29/2017 1452   CREATININE 1.87 (H) 11/06/2019 1114   CREATININE 2.1 (H) 06/29/2017 1452      Component Value Date/Time  CALCIUM 9.4 11/06/2019 1114   CALCIUM 9.5 06/29/2017 1452   ALKPHOS 75 11/06/2019 1114   ALKPHOS 83 06/29/2017 1452   AST 18 11/06/2019 1114   ALT 18 11/06/2019 1114   ALT 23 06/29/2017 1452   BILITOT 0.6 11/06/2019 1114       Impression and Plan: Mr. 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 He is on lifelong anticoagulation with maintenance dose Xarelto 10 mg PO daily.  We will see him again in another 6 months.  He can contact our office with any questions or concerns.   Laverna Peace, NP 9/3/20219:11 AM

## 2020-03-19 ENCOUNTER — Telehealth: Payer: Self-pay | Admitting: Family Medicine

## 2020-03-19 NOTE — Progress Notes (Signed)
°  Chronic Care Management   Outreach Note  03/19/2020 Name: [name] MRN: [id number] DOB: 1950/12/04  Referred by: [name], MD Reason for referral : No chief complaint on file.   A second unsuccessful telephone outreach was attempted today. The patient was referred to pharmacist for assistance with care management and care coordination.  Follow Up Plan:   Carley Perdue [employer] Scheduler

## 2020-03-25 DIAGNOSIS — M79671 Pain in right foot: Secondary | ICD-10-CM | POA: Diagnosis not present

## 2020-03-25 DIAGNOSIS — M25552 Pain in left hip: Secondary | ICD-10-CM | POA: Diagnosis not present

## 2020-03-25 DIAGNOSIS — G5761 Lesion of plantar nerve, right lower limb: Secondary | ICD-10-CM | POA: Diagnosis not present

## 2020-03-27 DIAGNOSIS — L905 Scar conditions and fibrosis of skin: Secondary | ICD-10-CM | POA: Diagnosis not present

## 2020-03-27 DIAGNOSIS — L570 Actinic keratosis: Secondary | ICD-10-CM | POA: Diagnosis not present

## 2020-03-27 DIAGNOSIS — I8393 Asymptomatic varicose veins of bilateral lower extremities: Secondary | ICD-10-CM | POA: Diagnosis not present

## 2020-03-27 DIAGNOSIS — Z85828 Personal history of other malignant neoplasm of skin: Secondary | ICD-10-CM | POA: Diagnosis not present

## 2020-03-27 DIAGNOSIS — D229 Melanocytic nevi, unspecified: Secondary | ICD-10-CM | POA: Diagnosis not present

## 2020-03-27 DIAGNOSIS — L821 Other seborrheic keratosis: Secondary | ICD-10-CM | POA: Diagnosis not present

## 2020-03-27 DIAGNOSIS — D485 Neoplasm of uncertain behavior of skin: Secondary | ICD-10-CM | POA: Diagnosis not present

## 2020-03-27 DIAGNOSIS — L819 Disorder of pigmentation, unspecified: Secondary | ICD-10-CM | POA: Diagnosis not present

## 2020-03-27 DIAGNOSIS — L814 Other melanin hyperpigmentation: Secondary | ICD-10-CM | POA: Diagnosis not present

## 2020-03-27 DIAGNOSIS — D1801 Hemangioma of skin and subcutaneous tissue: Secondary | ICD-10-CM | POA: Diagnosis not present

## 2020-03-28 ENCOUNTER — Telehealth: Payer: Self-pay | Admitting: Family Medicine

## 2020-03-28 DIAGNOSIS — Z23 Encounter for immunization: Secondary | ICD-10-CM | POA: Diagnosis not present

## 2020-03-28 NOTE — Progress Notes (Signed)
  Chronic Care Management   Note  03/28/2020 Name: Joseph Tran MRN: [id number] DOB: 30-Mar-1951  Joseph Tran is a 69 y.o. year old male who is a primary care patient of [name], MD. I reached out to Barrington Ellison by phone today in response to a referral sent by Joseph Tran PCP, [name], MD.   Mr. Orlick was given information about Chronic Care Management services today including:  1. CCM service includes personalized support from designated clinical staff supervised by his physician, including individualized plan of care and coordination with other care providers 2. 24/7 contact phone numbers for assistance for urgent and routine care needs. 3. Service will only be billed when office clinical staff spend 20 minutes or more in a month to coordinate care. 4. Only one practitioner may furnish and bill the service in a calendar month. 5. The patient may stop CCM services at any time (effective at the end of the month) by phone call to the office staff.   Patient agreed to services and verbal consent obtained.   Follow up plan:   Carley Perdue [employer] Scheduler

## 2020-04-02 ENCOUNTER — Telehealth: Payer: Self-pay | Admitting: Internal Medicine

## 2020-04-02 NOTE — Telephone Encounter (Signed)
Patient indicates that he wrote down instructions given to him on 03/04/20 but forgot what he did with them. He was calling for clarification. I reiterated he should hold xarelto 2 days prior to upcoming colonoscopy and may continue asa as previously prescribed.

## 2020-04-03 DIAGNOSIS — C4441 Basal cell carcinoma of skin of scalp and neck: Secondary | ICD-10-CM | POA: Diagnosis not present

## 2020-04-03 DIAGNOSIS — D485 Neoplasm of uncertain behavior of skin: Secondary | ICD-10-CM | POA: Diagnosis not present

## 2020-04-07 ENCOUNTER — Other Ambulatory Visit: Payer: Self-pay

## 2020-04-07 ENCOUNTER — Ambulatory Visit (AMBULATORY_SURGERY_CENTER): Payer: Medicare Other | Admitting: Internal Medicine

## 2020-04-07 ENCOUNTER — Encounter: Payer: Self-pay | Admitting: Internal Medicine

## 2020-04-07 VITALS — BP 138/77 | HR 62 | Temp 98.0°F | Resp 16 | Ht 72.0 in | Wt 250.0 lb

## 2020-04-07 DIAGNOSIS — G4733 Obstructive sleep apnea (adult) (pediatric): Secondary | ICD-10-CM | POA: Diagnosis not present

## 2020-04-07 DIAGNOSIS — J45909 Unspecified asthma, uncomplicated: Secondary | ICD-10-CM | POA: Diagnosis not present

## 2020-04-07 DIAGNOSIS — J449 Chronic obstructive pulmonary disease, unspecified: Secondary | ICD-10-CM | POA: Diagnosis not present

## 2020-04-07 DIAGNOSIS — D120 Benign neoplasm of cecum: Secondary | ICD-10-CM | POA: Diagnosis not present

## 2020-04-07 DIAGNOSIS — D124 Benign neoplasm of descending colon: Secondary | ICD-10-CM | POA: Diagnosis not present

## 2020-04-07 DIAGNOSIS — Z1211 Encounter for screening for malignant neoplasm of colon: Secondary | ICD-10-CM

## 2020-04-07 HISTORY — PX: COLONOSCOPY: SHX174

## 2020-04-07 MED ORDER — SODIUM CHLORIDE 0.9 % IV SOLN: 500.0000 mL | Freq: Once | INTRAVENOUS | Status: DC

## 2020-04-07 NOTE — Progress Notes (Addendum)
AR - Check-in CW - VS   Pt had a biopsy taken on the top of his head this past Thursday, Sept 30, 2021.  When he removed the bandage, he had bleeding at the site.  He called Dr. Enrique Sack, Dermatologist and also Dr. [name] who both instructed pt to hold XARELTO.  So the last dose of XARELTO was 04-03-20.  Sign is hanging on IV pole.    Pt has a transportation service as his care partner today.  Vincente Liberty is his driver.  She has pt's cell phone.  At first pt said he wanted her to receive his results today because he was concerned he may not remember what the MD tells him.  I explained that he would he given a written report and the MD and his recovery room nurse will also go over the findings with him.  Also his discharge instructions will also be written out for him.  Pt then decided he would rather not share his findings with the driver.  I explained this to [facility] pre-procedure. maw

## 2020-04-07 NOTE — Patient Instructions (Signed)
YOU HAD AN ENDOSCOPIC PROCEDURE TODAY AT [facility]:   Refer to the procedure report that was given to you for any specific questions about what was found during the examination.  If the procedure report does not answer your questions, please call your gastroenterologist to clarify.  If you requested that your care partner not be given the details of your procedure findings, then the procedure report has been included in a sealed envelope for you to review at your convenience later.  YOU SHOULD EXPECT: Some feelings of bloating in the abdomen. Passage of more gas than usual.  Walking can help get rid of the air that was put into your GI tract during the procedure and reduce the bloating. If you had a lower endoscopy (such as a colonoscopy or flexible sigmoidoscopy) you may notice spotting of blood in your stool or on the toilet paper. If you underwent a bowel prep for your procedure, you may not have a normal bowel movement for a few days.  Please Note:  You might notice some irritation and congestion in your nose or some drainage.  This is from the oxygen used during your procedure.  There is no need for concern and it should clear up in a day or so.  SYMPTOMS TO REPORT IMMEDIATELY:   Following lower endoscopy (colonoscopy or flexible sigmoidoscopy):  Excessive amounts of blood in the stool  Significant tenderness or worsening of abdominal pains  Swelling of the abdomen that is new, acute  Fever of 100F or higher  For urgent or emergent issues, a gastroenterologist can be reached at any hour by calling [phone]. Do not use MyChart messaging for urgent concerns.    DIET:  We do recommend a small meal at first, but then you may proceed to your regular diet.  Drink plenty of fluids but you should avoid alcoholic beverages for 24 hours.  ACTIVITY:  You should plan to take it easy for the rest of today and you should NOT DRIVE or use heavy machinery until tomorrow (because  of the sedation medicines used during the test).    FOLLOW UP: Our staff will call the number listed on your records 48-72 hours following your procedure to check on you and address any questions or concerns that you may have regarding the information given to you following your procedure. If we do not reach you, we will leave a message.  We will attempt to reach you two times.  During this call, we will ask if you have developed any symptoms of COVID 19. If you develop any symptoms (ie: fever, flu-like symptoms, shortness of breath, cough etc.) before then, please call [phone].  If you test positive for Covid 19 in the 2 weeks post procedure, please call and report this information to [place].    If any biopsies were taken you will be contacted by phone or by letter within the next 1-3 weeks.  Please call us at [phone] if you have not heard about the biopsies in 3 weeks.    SIGNATURES/CONFIDENTIALITY: You and/or your care partner have signed paperwork which will be entered into your electronic medical record.  These signatures attest to the fact that that the information above on your After Visit Summary has been reviewed and is understood.  Full responsibility of the confidentiality of this discharge information lies with you and/or your care-partner.  Await pathology  Resume Xarelto tomorrow- please refer to managing physician for further adjustment of therapy  Apply  Diltiazem gel 2% to the anal canal (pea-sized amount inserted to the 1st knuckle)- twice daily for anal fissure  Is persistent discomfort dermatology consultation is recommended to evaluate skin changes at anus  Please read over handouts about polyps and diverticulosis

## 2020-04-07 NOTE — Progress Notes (Signed)
Called to room to assist during endoscopic procedure.  Patient ID and intended procedure confirmed with present staff. Received instructions for my participation in the procedure from the performing physician.  

## 2020-04-07 NOTE — Op Note (Signed)
Joseph Tran Patient Name: Joseph Tran Procedure Date: 04/07/2020 2:08 PM MRN: [id number] Endoscopist: Jerene Bears , MD Age: 69 Referring MD:  Date of Birth: 11-May-1951 Gender: Male Account #: [id number] Procedure:                Colonoscopy Indications:              Screening for colorectal malignant neoplasm, Last                            colonoscopy 10 years ago; history of hemorrhoids                            and anal fissures Medicines:                Monitored Anesthesia Care Procedure:                Pre-Anesthesia Assessment:                           - Prior to the procedure, a History and Physical                            was performed, and patient medications and                            allergies were reviewed. The patient's tolerance of                            previous anesthesia was also reviewed. The risks                            and benefits of the procedure and the sedation                            options and risks were discussed with the patient.                            All questions were answered, and informed consent                            was obtained. Prior Anticoagulants: The patient has                            taken Xarelto (rivaroxaban), last dose was 4 days                            prior to procedure. ASA Grade Assessment: II - A                            patient with mild systemic disease. After reviewing                            the risks and benefits, the patient was deemed in  satisfactory condition to undergo the procedure.                           After obtaining informed consent, the colonoscope                            was passed under direct vision. Throughout the                            procedure, the patient's blood pressure, pulse, and                            oxygen saturations were monitored continuously. The                            Colonoscope was introduced  through the anus and                            advanced to the terminal ileum. The colonoscopy was                            performed without difficulty. The patient tolerated                            the procedure well. The quality of the bowel                            preparation was good. The ileocecal valve,                            appendiceal orifice, and rectum were photographed. Scope In: 2:22:34 PM Scope Out: 2:39:20 PM Scope Withdrawal Time: 0 hours 14 minutes 34 seconds  Total Procedure Duration: 0 hours 16 minutes 46 seconds  Findings:                 The perianal exam findings include atrophic changes                            and mild excoriations of the anal/perianal skin. No                            masses. Small external hemorrhoids.                           Three sessile polyps were found in the cecum. The                            polyps were 2 to 3 mm in size. These polyps were                            removed with a cold snare. Resection and retrieval                            were complete.  A 5 mm polyp was found in the descending colon. The                            polyp was sessile. The polyp was removed with a                            cold snare. Resection and retrieval were complete.                           A few small-mouthed diverticula were found in the                            sigmoid colon.                           The exam was otherwise without abnormality on                            direct and retroflexion views. Complications:            No immediate complications. Estimated Blood Loss:     Estimated blood loss was minimal. Impression:               - Atrophic changes of the anus/perianal skin with                            mild excoriation. Small external hemorrhoids.                           - Three 2 to 3 mm polyps in the cecum, removed with                            a cold snare. Resected and  retrieved.                           - One 5 mm polyp in the descending colon, removed                            with a cold snare. Resected and retrieved.                           - Diverticulosis in the sigmoid colon.                           - The examination was otherwise normal on direct                            and retroflexion views. No internal hemorrhoids                            seen today. Recommendation:           - Patient has a contact number available for  emergencies. The signs and symptoms of potential                            delayed complications were discussed with the                            patient. Return to normal activities tomorrow.                            Written discharge instructions were provided to the                            patient.                           - Resume previous diet.                           - Continue present medications.                           - Await pathology results.                           - Resume Xarelto (rivaroxaban) at prior dose                            tomorrow. Refer to managing physician for further                            adjustment of therapy.                           - Diltiazem gel 2% applied to the anal canal                            (pea-sized amount inserted to the 1st knuckle)                            twice daily as needed for anal fissure. RectiCare                            per box instruction for anal pain.                           - If persistent discomfort dermatology consultation                            is recommended to evaluate skin changes at anus. Jerene Bears, MD 04/07/2020 2:48:54 PM This report has been signed electronically.

## 2020-04-07 NOTE — Progress Notes (Signed)
Report to PACU, RN, vss, BBS= Clear.  

## 2020-04-09 ENCOUNTER — Telehealth: Payer: Self-pay | Admitting: *Deleted

## 2020-04-09 NOTE — Telephone Encounter (Signed)
  Follow up Call-  Call back number 04/07/2020  Post procedure Call Back phone  # [phone] cell  Permission to leave phone message Yes  Some recent data might be hidden     Patient questions:  Do you have a fever, pain , or abdominal swelling? No. Pain Score  0 *  Have you tolerated food without any problems? Yes.    Have you been able to return to your normal activities? Yes.    Do you have any questions about your discharge instructions: Diet   No. Medications  No. Follow up visit  No.  Do you have questions or concerns about your Care? No.  Actions: * If pain score is 4 or above: No action needed, pain <4.  1. Have you developed a fever since your procedure? no  2.   Have you had an respiratory symptoms (SOB or cough) since your procedure? no  3.   Have you tested positive for COVID 19 since your procedure no  4.   Have you had any family members/close contacts diagnosed with the COVID 19 since your procedure?  no   If yes to any of these questions please route to Joylene John, RN and Joella Prince, RN

## 2020-04-11 ENCOUNTER — Telehealth: Payer: Self-pay | Admitting: *Deleted

## 2020-04-11 NOTE — Telephone Encounter (Signed)
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  Dr. Marin Olp notified.  Call placed back to patient and message left to inform pt per order of Dr. Marin Olp to hold Xarelto and Aspirin for three days prior to his surgery date and to restart Xarelto and Aspirin the day after surgery as long as he is having no bleeding.  Informed pt to call office back on Monday, 04/14/20 with any questions or concerns.

## 2020-04-15 ENCOUNTER — Encounter: Payer: Self-pay | Admitting: Internal Medicine

## 2020-04-21 DIAGNOSIS — C4441 Basal cell carcinoma of skin of scalp and neck: Secondary | ICD-10-CM | POA: Diagnosis not present

## 2020-05-09 ENCOUNTER — Ambulatory Visit: Payer: Medicare Other | Attending: Internal Medicine

## 2020-05-09 ENCOUNTER — Other Ambulatory Visit (HOSPITAL_BASED_OUTPATIENT_CLINIC_OR_DEPARTMENT_OTHER): Payer: Self-pay | Admitting: Internal Medicine

## 2020-05-09 DIAGNOSIS — Z23 Encounter for immunization: Secondary | ICD-10-CM

## 2020-05-09 NOTE — Progress Notes (Signed)
   Covid-19 Vaccination Clinic  Name:  BECKER CHRISTOPHER    MRN: [id number] DOB: September 16, 1950  05/09/2020  Mr. Mose was observed post Covid-19 immunization for 15 minutes without incident. He was provided with Vaccine Information Sheet and instruction to access the V-Safe system.   Mr. Mahler was instructed to call 911 with any severe reactions post vaccine: [name] Difficulty breathing  . Swelling of face and throat  . A fast heartbeat  . A bad rash all over body  . Dizziness and weakness

## 2020-05-15 MED FILL — PFIZER-BIONTECH COVID-19 VA: 30 | 1 days supply | Qty: 0 | Fill #0

## 2020-05-24 ENCOUNTER — Other Ambulatory Visit: Payer: Self-pay | Admitting: Family Medicine

## 2020-05-26 ENCOUNTER — Telehealth: Payer: Self-pay | Admitting: Pharmacist

## 2020-05-26 NOTE — Chronic Care Management (AMB) (Signed)
Chronic Care Management Pharmacy Assistant   Name: Joseph Tran  MRN: [id number] DOB: [birth year]  Reason for Encounter: Medication Review/Initial Questions for Pharmacist visit on 05/26/2020  Patient Questions: 1. Have you seen any other providers since your last visit? Yes . Dermatology . Gastroenterology . Oncology 2. Any changes in your medications or health? No 3. Any side effects from any medications? .  Xarelto-easily bruising and bleeding. He states he knows this is common with this medication. 4. Do you have any symptoms or problems not managed by your medications? No  5. Any concerns about your health right now? . He has some concerns about hearing loss in his left ear . Short term memory . Weight 6. Has your provider asked that you check blood pressure, blood sugar, or follow a special diet at home? No 7. Do you get any type of exercise regularly?  . He works daily on a farm with horses.  8. Can you think of a goal you would like to reach for your health? . He would like to lose about 50 pounds. 9. Do you have any problems getting your medications? No 10. Is there anything that you would like to discuss during the appointment? No  The patient was asked to please bring medications, blood pressure/ blood sugar log, and supplements to his appointment.  PCP : [name], MD  Allergies:   Allergies  Allergen Reactions  . Allopurinol Other (See Comments)    PATIENT PREFERENCE Pt refused due to mom developing steven johnson syndrome.  [name] Penicillins Hives    Has patient had a PCN reaction causing immediate rash, facial/tongue/throat swelling, SOB or lightheadedness with hypotension: Unknown Has patient had a PCN reaction causing severe rash involving mucus membranes or skin necrosis:Unknown Has patient had a PCN reaction that required hospitalization: No Has patient had a PCN reaction occurring within the last 10 years: No If all of the above answers are "NO",  then may proceed with Cephalosporin use.   . Sulfamethoxazole Hives  . Sulfonamide Derivatives Hives  . Dilaudid [Hydromorphone Hcl] Nausea And Vomiting    Medications: Outpatient Encounter Medications as of 05/26/2020  Medication Sig  . acetaminophen (TYLENOL) 500 MG tablet Take 1,000 mg by mouth as needed.   [name] albuterol (PROAIR HFA) 108 (90 Base) MCG/ACT inhaler INHALE 2 PUFFS EVERY 4  HOURS AS NEEDED FOR  WHEEZING OR SHORTNESS OF  BREATH  . aspirin EC 81 MG tablet Take 162 mg by mouth daily.  . bisacodyl (STIMULANT LAXATIVE) 5 MG EC tablet Take 5 mg by mouth at bedtime.  [name] buPROPion (WELLBUTRIN XL) 300 MG 24 hr tablet TAKE 1 TABLET BY MOUTH  DAILY  . clonazePAM (KLONOPIN) 1 MG tablet TAKE 1 TABLET BY MOUTH  TWICE A DAY AS NEEDED FOR  ANXIETY  . Desvenlafaxine Succinate ER (PRISTIQ) 25 MG TB24 Take 1 tablet by mouth daily.  [name] diltiazem 2 % GEL Apply twice daily to rectum- may mix with OTC recticare  . finasteride (PROSCAR) 5 MG tablet Take 5 mg by mouth every morning.   . Fluticasone-Salmeterol (ADVAIR DISKUS) 250-50 MCG/DOSE AEPB Inhale 1 puff into the lungs in the morning and at bedtime.  . furosemide (LASIX) 20 MG tablet Take 1 tablet (20 mg total) by mouth daily.  [name] GAMMA AMINOBUTYRIC ACID PO Take 750 mg by mouth daily. GABA  . hydroxypropyl methylcellulose / hypromellose (ISOPTO TEARS / GONIOVISC) 2.5 % ophthalmic solution Place 1-2 drops into both eyes 3 (three)  times daily as needed for dry eyes ((SCHEDULED EACH MORNING)).  . Melatonin 3 MG TBDP Take 3 mg by mouth at bedtime.  . tadalafil (CIALIS) 5 MG tablet Take 1 tablet (5 mg total) by mouth daily as needed for erectile dysfunction.  . tamsulosin (FLOMAX) 0.4 MG CAPS Take 0.4 mg by mouth at bedtime.   . traMADol (ULTRAM) 50 MG tablet Take 50 mg by mouth every 6 (six) hours as needed.  . vitamin B-12 (CYANOCOBALAMIN) 250 MCG tablet Take 250 mcg by mouth daily.  Alveda Reasons 20 MG TABS tablet TAKE ONE-HALF TABLET BY  MOUTH DAILY    No facility-administered encounter medications on file as of 05/26/2020.    Current Diagnosis: Patient Active Problem List   Diagnosis Date Noted  . Hyperglycemia 05/30/2019  . Primary osteoarthritis of right knee 01/19/2019  . CKD (chronic kidney disease), stage III ([place]) 09/13/2017  . Renal atrophy, left 09/13/2017  . Systemic lupus erythematosus ([place]) 09/13/2017  . Dyslipidemia 09/13/2017  . Acute meniscal tear, lateral, right, initial encounter 04/25/2017  . Osteoarthritis of right knee 04/25/2017  . Concussion with loss of consciousness 08/05/2015  . Laceration of spleen 08/05/2015  . Lumbar stress fracture 08/05/2015  . DVT (deep venous thrombosis) ([place]) 10/18/2014  . Hx of bacterial pneumonia 03/10/2013  . COLONIC POLYPS, HX OF 04/08/2010  . NEPHROLITHIASIS, HX OF 04/08/2010  . ACOUSTIC NEUROMA 04/07/2010  . Hypogonadism male 04/07/2010  . Depression with anxiety 04/07/2010  . SLEEP APNEA, OBSTRUCTIVE 04/07/2010  . Asthma 04/07/2010  . BENIGN PROSTATIC HYPERTROPHY, WITH OBSTRUCTION 04/07/2010  . 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 PLANTAR FASCIITIS 04/07/2010    Goals Addressed   None     Follow-Up:  Pharmacist Review  Maia Breslow, [place] Assistant [phone]

## 2020-05-27 ENCOUNTER — Ambulatory Visit: Payer: Medicare Other | Admitting: Pharmacist

## 2020-05-27 ENCOUNTER — Other Ambulatory Visit: Payer: Self-pay | Admitting: Family Medicine

## 2020-05-27 DIAGNOSIS — F418 Other specified anxiety disorders: Secondary | ICD-10-CM

## 2020-05-27 DIAGNOSIS — E785 Hyperlipidemia, unspecified: Secondary | ICD-10-CM

## 2020-05-27 NOTE — Chronic Care Management (AMB) (Signed)
Chronic Care Management Pharmacy  Name: Joseph Tran  MRN: [id number] DOB: [birth year]  Initial Planning Appointment: completed 05/26/20  Initial Questions: 1. Have you seen any other providers since your last visit? n/a 2. Any changes in your medicines or health? No   Chief Complaint/ HPI  Joseph Tran,  69 y.o. , male presents for their Initial CCM visit with the clinical pharmacist via telephone due to COVID-19 Pandemic.  PCP : [name], MD  Their chronic conditions include: Depression with anxiety, DVT, asthma/OSA, BPH, fluid retention, OA, constipation  Office Visits: -05/30/19 [name], MD: Patient presented for annual exam. Elevated LDL and renal insufficiency. Referral placed for pulmonology.   Consult Visit: -03/07/20 Laverna Peace, NP (oncology): Patient presented for TE/DVT and renal insufficiency-atrophied left kidney follow up. Continues Xarelto and 2 baby aspirin. Follow up in 6 months.  02/27/20 Zenovia Jarred, MD (gastroenterology): Patient presented for colon cancer screening evaluation. Plan to repeat colonoscopy and hold Xarelto prior. Prescribed diltiazem gel and Recticare for hemorrhoids and anal fissures.  01/28/20 [name], MD (nephrology): Patient presented for follow up. Unable to access notes.  12/12/19 Sherrilyn Rist, MD (pulmonary): Patient presented for OSA on CPAP follow up. Encouraged patient to find out if there is a preferred medication cheaper than Advair. Follow up in 6 months.    Medications: Outpatient Encounter Medications as of 05/27/2020  Medication Sig  . acetaminophen (TYLENOL) 500 MG tablet Take 1,000 mg by mouth as needed.   [name] albuterol (PROAIR HFA) 108 (90 Base) MCG/ACT inhaler INHALE 2 PUFFS EVERY 4  HOURS AS NEEDED FOR  WHEEZING OR SHORTNESS OF  BREATH  . aspirin EC 81 MG tablet Take 162 mg by mouth daily.  . bisacodyl (STIMULANT LAXATIVE) 5 MG EC tablet Take 5 mg by mouth at bedtime.  [name] buPROPion (WELLBUTRIN XL) 300  MG 24 hr tablet TAKE 1 TABLET BY MOUTH  DAILY  . clonazePAM (KLONOPIN) 1 MG tablet TAKE 1 TABLET BY MOUTH  TWICE DAILY AS NEEDED FOR  ANXIETY  . Desvenlafaxine Succinate ER (PRISTIQ) 25 MG TB24 Take 1 tablet by mouth daily.  [name] diltiazem 2 % GEL Apply twice daily to rectum- may mix with OTC recticare  . docusate sodium (COLACE) 100 MG capsule Take 100 mg by mouth daily.  . finasteride (PROSCAR) 5 MG tablet Take 5 mg by mouth every morning.   . Fluticasone-Salmeterol (ADVAIR DISKUS) 250-50 MCG/DOSE AEPB Inhale 1 puff into the lungs in the morning and at bedtime.  . furosemide (LASIX) 20 MG tablet Take 1 tablet (20 mg total) by mouth daily. (Patient taking differently: Take 20 mg by mouth daily as needed. )  . GAMMA AMINOBUTYRIC ACID PO Take 750 mg by mouth daily. GABA  . hydroxypropyl methylcellulose / hypromellose (ISOPTO TEARS / GONIOVISC) 2.5 % ophthalmic solution Place 1-2 drops into both eyes 3 (three) times daily as needed for dry eyes ((SCHEDULED EACH MORNING)).  . Melatonin 3 MG TBDP Take 3 mg by mouth at bedtime.  . psyllium (METAMUCIL) 58.6 % packet Take 1 packet by mouth daily.  . tadalafil (CIALIS) 5 MG tablet Take 1 tablet (5 mg total) by mouth daily as needed for erectile dysfunction.  . tamsulosin (FLOMAX) 0.4 MG CAPS Take 0.4 mg by mouth at bedtime.   . traMADol (ULTRAM) 50 MG tablet Take 50 mg by mouth every 6 (six) hours as needed.  Alveda Reasons 20 MG TABS tablet TAKE ONE-HALF TABLET BY  MOUTH DAILY  . [DISCONTINUED]  clonazePAM (KLONOPIN) 1 MG tablet TAKE 1 TABLET BY MOUTH  TWICE A DAY AS NEEDED FOR  ANXIETY  . vitamin B-12 (CYANOCOBALAMIN) 250 MCG tablet Take 250 mcg by mouth daily. (Patient not taking: Reported on 05/27/2020)   No facility-administered encounter medications on file as of 05/27/2020.   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 Patient reported he wants to start looking for a part time job. 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 He also spends some time watching TV in the evening.   He does not have a social support system as he moved here for his job and a niece who is now estranged and is an alcoholic. The closest relative he has is his sister in [place]. 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  He does his own cooking and cleaning. He used to eat out some but is limited in what he can eat. For breakfast, he eats cinnamon oatmeal with walnuts and for lunch he eats tuna salad with whole wheat crackers. For supper, he eats frozen veggies (cauliflower, broccoli, or carrots), kale mix with chicken patty with peppers & salad dressing. Yesterday he had fried chicken but doesn't do that often. He doesn't get hungry often. He sleeps about 8-9 hours per night and uses melatonin 3 mg and clonazepam for sleep.   For his medications, he is concerned about the risk for bleeding with Xarelto and the cost of the medication as well as he is concerned with the long term use of his depression/anxiety medications.   Current Diagnosis/Assessment:  Goals Addressed            This Visit's Progress   . Pharmacy Care Plan       CARE PLAN ENTRY (see longitudinal plan of care for additional care plan information)  Current Barriers:  . Chronic Disease Management support, education, and care coordination needs related to Asthma, Depression, BPH, and history of DVT   Asthma/Obstructive sleep apnea BP Readings from Last 3 Encounters:  04/07/20 138/77  03/07/20 124/76  02/27/20 126/70   . Pharmacist Clinical Goal(s): o Over the next 120 days, patient will work with PharmD and providers to improve breathing and minimize use of rescue inhaler . Current regimen:  . Advair 250-50 mcg/dose 1 puff twice daily . Albuterol 108 mcg/act 1 puff as needed . Interventions: o Discussed the difference between inhalers  as albuterol is only a rescue inhaler and Advair should improve overall breathing function . Patient self care activities - Over the next 120 days, patient will: o Start using Advair daily to improve overall lung function o Only use albuterol as needed for wheezing or shortness of breath  History of DVT . Pharmacist Clinical Goal(s): o Over the next 120 days, patient will work with PharmD and providers to prevent blood clots . Current regimen:  . Xarelto 10 mg 1 tablet daily . Aspirin 81 mg 2 tablets daily . Interventions: o Discussed monitoring for signs of bleeding such as unexplained and excessive bleeding from a cut or injury, easy or excessive bruising, blood in urine or stools, and nosebleeds without a known cause o Discussed patient assistance qualifications for [place] . Patient self care activities - Over the next 120 days, patient will: o Will discuss bleeding risk with both of these medications with Dr. Marin Olp  and possibly request a dose decrease of aspirin  o Fill out patient assistance application for Xarelto if he qualifies based on income  Depression with anxiety . Pharmacist Clinical Goal(s): o Over the next 120 days, patient will work with PharmD and providers to manage symptoms of depression and anxiety . Current regimen:  . Bupropion XL 300 mg 1 tablet daily . Clonazepam 1 mg 1 tablet twice daily as needed for anxiety  . Pristiq 25 mg 1 tablet daily  . Interventions: o Discussed the possibility of either bupropion or Pristiq causing vivid dreams o Discussed the long term risks of taking clonazepam as it can affect memory . Patient self care activities - Over the next 120 days, patient will: o Discuss with PCP about switching medications to lower risks for memory problems and possible dreams side effect  BPH Lab Results  Component Value Date   PSA 4.42 (H) 04/22/2016   PSA 2.30 04/10/2015   PSA 2.28 04/05/2014   . Pharmacist Clinical Goal(s) o Over the next  120 days, patient will work with PharmD and providers to manage symptoms of enlarged prostate . Current regimen:  . Finasteride 5 mg 1 tablet daily (in AM) . Tamsulosin 0.4 mg 1 capsule daily (in PM) . Interventions: o Discussed consistent administration of finasteride 30 minutes to 1 hour after a meal to help with absorption of the medications . Patient self care activities - Over the next 120 days, patient will: o Continue current medications  Medication management . Pharmacist Clinical Goal(s): o Over the next 120 days, patient will work with PharmD and providers to maintain optimal medication adherence . Current pharmacy: [facility] . Interventions o Comprehensive medication review performed. o Continue current medication management strategy . Patient self care activities - Over the next 120 days, patient will: o Take medications as prescribed o Report any questions or concerns to PharmD and/or provider(s)  Initial goal documentation       SDOH Interventions     Most Recent Value  SDOH Interventions  Financial Strain Interventions Other (Comment)  [patient will apply for patient assistance for Xarelto]  Transportation Interventions Intervention Not Indicated      Depression with anxiety   No flowsheet data found.  Patient has failed these meds in past: Lexapro (unknown), Effexor (unknown) Patient is currently controlled on the following medications:  . Bupropion XL 300 mg 1 tablet daily . Clonazepam 1 mg 1 tablet twice daily as needed for anxiety  . Pristiq 25 mg 1 tablet daily   We discussed:  Patient reports feeling much better with the addition of Pristiq but does think he was bipolar (depression runs in cycles) and has concerns about memory and long term effects on his brain; discussed long term risks of taking benzodiazepines -Patient is concerned about having nightmares and sometimes wakes up crying and is wondering if this is medication induced  Plan Patient  will discuss with PCP about memory concerns for medications - consider tapering clonazepam and starting hydroxyzine for sleep and anxiety.  Continue current medications  DVT   Patient has failed these meds in past: none Patient is currently controlled on the following medications:  . Xarelto 10 mg 1 tablet daily . Aspirin 81 mg 2 tablets daily  We discussed:  Monitoring for signs of bleeding such as unexplained and excessive bleeding from a cut or injury, easy or excessive bruising, blood in urine or stools, and nosebleeds without a known cause; discussed patient assistance requirements for Xarelto  Plan  Patient will discuss with Dr. Marin Olp about decreasing aspirin. Will mail patient assistance application for Xarelto.   Continue current medications   Asthma/OSA   Last spirometry score: none  Patient has failed these meds in past: none Patient is currently uncontrolled on the following medications:  . Advair 250-50 mcg/dose 1 puff BID  . Albuterol 108 mcg/act 1 puff PRN (2 times a week)   Using maintenance inhaler regularly? No  Frequency of rescue inhaler use:  1-2x per week  We discussed:  proper inhaler technique  -Rinsing and spitting with each use of Advair -Patient does not use Advair or wish to use it as his cough has resolved  Plan Recommend spirometry testing based on asthma diagnosis. Continue current medications  BPH   PSA  Date Value Ref Range Status  04/22/2016 4.42 (H) 0.10 - 4.00 ng/mL Final  04/10/2015 2.30 0.10 - 4.00 ng/mL Final  04/05/2014 2.28 0.10 - 4.00 ng/mL Final     Patient has failed these meds in past: none Patient is currently controlled on the following medications:  . Finasteride 5 mg 1 tablet daily (in AM) . Tamsulosin 0.4 mg 1 capsule daily (in PM)  We discussed:  Patient switched to taking tamsulosin in AM and  finasteride in PM to help with nightmares; -Recommended consistent administration after food for  tamsulosin  Plan  Continue current medications   Fluid retention   Patient has failed these meds in past: none Patient is currently controlled on the following medications:  . Furosemide 20 mg 1 tablet daily or PRN  We discussed:  Patient only takes if right ankle swells with salt in diet and he does watch his salt intake and uses low sodium salt morton lite  Plan Continue current medications   OA/Pain   Patient has failed these meds in past: none Patient is currently controlled on the following medications:  . Tramadol 50 mg 1 tablet PRN . Acetaminophen 500 mg 2 tablets PRN  We discussed:  Patient is using tramadol sparingly and last used 1.5 months ago; discussed maximum daily dose of Tylenol of 3,000 mg/day  Plan  Continue current medications  Constipation   Patient is currently controlled on the following medications:  . Bisacodyl 5 mg 1 tablet at bedtime . Sennakot 1 tablet daily . Metamucil 1 spoonful daily  We discussed: drinking plenty of fluids and incorporating lots of fiber in his diet  Plan  Continue current medications  Miscellaneous/OTC   Patient is currently on the following medications:  [name] GABBA 750 mg 1 tablet daily in evening  . Melatonin 3 mg 1 tablet at bedtime . Diltiazem gel 2% once to twice daily . Isopto tears 1 drop 1-2 times a day (artificial tears) . Cialis 5 mg daily PRN  Plan  Continue current medications  Vaccines   Reviewed and discussed patient's vaccination history.    Immunization History  Administered Date(s) Administered  . Fluad Quad(high Dose 65+) 03/24/2019  . Influenza Split 04/02/2011, 03/16/2012  . Influenza, High Dose Seasonal PF 03/25/2017, 04/11/2018  . Influenza,inj,Quad PF,6+ Mos 03/27/2013, 03/25/2014, 04/22/2015, 04/28/2016  . PFIZER SARS-COV-2 Vaccination 08/19/2019, 09/11/2019, 05/09/2020  . Pneumococcal Conjugate-13 04/28/2016  . Pneumococcal Polysaccharide-23 07/20/2001, 07/20/2005  . Tdap  04/02/2011, 08/02/2015  . Zoster 08/10/2013   Patient reported receiving influenza vaccine HD at [facility] 4-6 weeks ago.  Plan  Recommended patient receive Shingrix vaccine in office/at pharmacy.   Medication Management   Patient's preferred pharmacy is:  [facility]  [place] - [address], [address], [place] [address] Phone: [phone] Fax: [facility] #[id number] - [place], [place] - [address] [place] [place] [address] Phone: [phone] Fax: [phone]  [place], [place] [address] Phone: [phone] Fax: [phone]  Uses pill box? No - uses a shelf on his kitchen cabinet separated into morning and evening Pt endorses 100% compliance   We discussed: Current pharmacy is preferred with insurance plan and patient is satisfied with pharmacy services  Plan  Continue current medication management strategy   Follow up: 4 month phone visit   Jeni Salles, PharmD Clinical Pharmacist [facility]

## 2020-06-03 DIAGNOSIS — R809 Proteinuria, unspecified: Secondary | ICD-10-CM | POA: Diagnosis not present

## 2020-06-03 DIAGNOSIS — E790 Hyperuricemia without signs of inflammatory arthritis and tophaceous disease: Secondary | ICD-10-CM | POA: Diagnosis not present

## 2020-06-03 DIAGNOSIS — R309 Painful micturition, unspecified: Secondary | ICD-10-CM | POA: Diagnosis not present

## 2020-06-03 DIAGNOSIS — I10 Essential (primary) hypertension: Secondary | ICD-10-CM | POA: Diagnosis not present

## 2020-06-03 DIAGNOSIS — N183 Chronic kidney disease, stage 3 unspecified: Secondary | ICD-10-CM | POA: Diagnosis not present

## 2020-06-03 NOTE — Patient Instructions (Addendum)
Hi Joseph Tran,  It was lovely to get to speak with you over the phone! Below is a summary of the topics we discussed. Additionally, I have attached the patient assistance application for Xarelto. Please fill out your portion and bring or mail it to Dr. Antonieta Pert office. Also, don't forget to discuss with Dr. Marin Olp about possibly decreasing your aspirin dose due to your concerns with bleeding.  As a reminder, please look into getting the Shingles vaccine at your pharmacy.  Please call me if you have any questions or need anything before our follow up appointment in 4 months!  Best, Maddie  Jeni Salles, PharmD [facility] Clinical Pharmacist [facility]   Visit Information  Goals Addressed            This Visit's Progress   . Pharmacy Care Plan       CARE PLAN ENTRY (see longitudinal plan of care for additional care plan information)  Current Barriers:  . Chronic Disease Management support, education, and care coordination needs related to Asthma, Depression, BPH, and history of DVT   Asthma/Obstructive sleep apnea BP Readings from Last 3 Encounters:  04/07/20 138/77  03/07/20 124/76  02/27/20 126/70   . Pharmacist Clinical Goal(s): o Over the next 120 days, patient will work with PharmD and providers to improve breathing and minimize use of rescue inhaler . Current regimen:  . Advair 250-50 mcg/dose 1 puff twice daily . Albuterol 108 mcg/act 1 puff as needed . Interventions: o Discussed the difference between inhalers as albuterol is only a rescue inhaler and Advair should improve overall breathing function . Patient self care activities - Over the next 120 days, patient will: o Start using Advair daily to improve overall lung function o Only use albuterol as needed for wheezing or shortness of breath  History of DVT . Pharmacist Clinical Goal(s): o Over the next 120 days, patient will work with PharmD and providers to prevent blood  clots . Current regimen:  . Xarelto 10 mg 1 tablet daily . Aspirin 81 mg 2 tablets daily . Interventions: o Discussed monitoring for signs of bleeding such as unexplained and excessive bleeding from a cut or injury, easy or excessive bruising, blood in urine or stools, and nosebleeds without a known cause o Discussed patient assistance qualifications for [place] . Patient self care activities - Over the next 120 days, patient will: o Will discuss bleeding risk with both of these medications with Dr. Marin Olp and possibly request a dose decrease of aspirin  o Fill out patient assistance application for Xarelto if he qualifies based on income  Depression with anxiety . Pharmacist Clinical Goal(s): o Over the next 120 days, patient will work with PharmD and providers to manage symptoms of depression and anxiety . Current regimen:  . Bupropion XL 300 mg 1 tablet daily . Clonazepam 1 mg 1 tablet twice daily as needed for anxiety  . Pristiq 25 mg 1 tablet daily  . Interventions: o Discussed the possibility of either bupropion or Pristiq causing vivid dreams o Discussed the long term risks of taking clonazepam as it can affect memory . Patient self care activities - Over the next 120 days, patient will: o Discuss with PCP about switching medications to lower risks for memory problems and possible dreams side effect  BPH Lab Results  Component Value Date   PSA 4.42 (H) 04/22/2016   PSA 2.30 04/10/2015   PSA 2.28 04/05/2014   . Pharmacist Clinical Goal(s) o Over the next  120 days, patient will work with PharmD and providers to manage symptoms of enlarged prostate . Current regimen:  . Finasteride 5 mg 1 tablet daily (in AM) . Tamsulosin 0.4 mg 1 capsule daily (in PM) . Interventions: o Discussed consistent administration of finasteride 30 minutes to 1 hour after a meal to help with absorption of the medications . Patient self care activities - Over the next 120 days, patient  will: o Continue current medications  Medication management . Pharmacist Clinical Goal(s): o Over the next 120 days, patient will work with PharmD and providers to maintain optimal medication adherence . Current pharmacy: [facility] . Interventions o Comprehensive medication review performed. o Continue current medication management strategy . Patient self care activities - Over the next 120 days, patient will: o Take medications as prescribed o Report any questions or concerns to PharmD and/or provider(s)  Initial goal documentation        [name] was given information about Chronic Care Management services today including:  1. CCM service includes personalized support from designated clinical staff supervised by his physician, including individualized plan of care and coordination with other care providers 2. 24/7 contact phone numbers for assistance for urgent and routine care needs. 3. Standard insurance, coinsurance, copays and deductibles apply for chronic care management only during months in which we provide at least 20 minutes of these services. Most insurances cover these services at 100%, however patients may be responsible for any copay, coinsurance and/or deductible if applicable. This service may help you avoid the need for more expensive face-to-face services. 4. Only one practitioner may furnish and bill the service in a calendar month. 5. The patient may stop CCM services at any time (effective at the end of the month) by phone call to the office staff.  Patient agreed to services and verbal consent obtained.   The patient verbalized understanding of instructions, educational materials, and care plan provided today and agreed to receive a mailed copy of patient instructions, educational materials, and care plan.  Telephone follow up appointment with pharmacy team member scheduled for: 4 months

## 2020-06-04 ENCOUNTER — Encounter: Payer: Self-pay | Admitting: Family Medicine

## 2020-06-04 ENCOUNTER — Other Ambulatory Visit: Payer: Self-pay

## 2020-06-04 ENCOUNTER — Ambulatory Visit (INDEPENDENT_AMBULATORY_CARE_PROVIDER_SITE_OTHER): Payer: Medicare Other | Admitting: Family Medicine

## 2020-06-04 VITALS — BP 142/80 | HR 52 | Temp 97.7°F | Ht 72.0 in | Wt 254.8 lb

## 2020-06-04 DIAGNOSIS — R739 Hyperglycemia, unspecified: Secondary | ICD-10-CM

## 2020-06-04 DIAGNOSIS — E785 Hyperlipidemia, unspecified: Secondary | ICD-10-CM | POA: Diagnosis not present

## 2020-06-04 DIAGNOSIS — N401 Enlarged prostate with lower urinary tract symptoms: Secondary | ICD-10-CM

## 2020-06-04 DIAGNOSIS — F418 Other specified anxiety disorders: Secondary | ICD-10-CM | POA: Diagnosis not present

## 2020-06-04 DIAGNOSIS — M329 Systemic lupus erythematosus, unspecified: Secondary | ICD-10-CM | POA: Diagnosis not present

## 2020-06-04 DIAGNOSIS — N138 Other obstructive and reflux uropathy: Secondary | ICD-10-CM

## 2020-06-04 DIAGNOSIS — J4520 Mild intermittent asthma, uncomplicated: Secondary | ICD-10-CM | POA: Diagnosis not present

## 2020-06-04 DIAGNOSIS — N1831 Chronic kidney disease, stage 3a: Secondary | ICD-10-CM | POA: Diagnosis not present

## 2020-06-04 DIAGNOSIS — E291 Testicular hypofunction: Secondary | ICD-10-CM

## 2020-06-04 DIAGNOSIS — D333 Benign neoplasm of cranial nerves: Secondary | ICD-10-CM | POA: Diagnosis not present

## 2020-06-04 NOTE — Progress Notes (Signed)
Subjective:    Patient ID: Joseph Tran, male    DOB: 12/04/1950, 69 y.o.   MRN: [id number]  HPI Here for a number of issues. 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 This had been followed by Dr. Lyndon Code, an ENT in [place], [place] when Lockhart lived there. The neuroma measured 3 mm in size, and it had not changed in size at all over a 5 year period. This has not been checked since he moved here in 2010 however. This has caused a mild hearing loss and some tinnitus on that side, but no dizziness or pain. He does describe some mild "pressure" in the ear however that started a few months ago. Also he is here for fasting labs to check his glucose, lipids, etc. He has been unable to lose weight, and he asks for help in this regard. He has been seeing Dr. [name] in [facility] for his stage 3 CKD.    Review of Systems  Constitutional: Negative.   HENT: Positive for hearing loss and tinnitus.   Eyes: Negative.   Respiratory: Negative.   Cardiovascular: Negative.   Gastrointestinal: Negative.   Genitourinary: Negative.   Musculoskeletal: Negative.   Skin: Negative.   Neurological: Negative.   Psychiatric/Behavioral: Negative.        Objective:   Physical Exam Constitutional:      General: He is not in acute distress.    Appearance: He is well-developed. He is obese. He is not diaphoretic.  HENT:     Head: Normocephalic and atraumatic.     Right Ear: External ear normal.     Left Ear: External ear normal.     Nose: Nose normal.     Mouth/Throat:     Pharynx: No oropharyngeal exudate.  Eyes:     General: No scleral icterus.       Right eye: No discharge.        Left eye: No discharge.     Conjunctiva/sclera: Conjunctivae normal.     Pupils: Pupils are equal, round, and reactive to light.  Neck:     Thyroid: No thyromegaly.     Vascular: No JVD.     Trachea: No tracheal deviation.  Cardiovascular:     Rate and Rhythm: Normal rate and regular rhythm.      Heart sounds: Normal heart sounds. No murmur heard.  No friction rub. No gallop.   Pulmonary:     Effort: Pulmonary effort is normal. No respiratory distress.     Breath sounds: Normal breath sounds. No wheezing or rales.  Chest:     Chest wall: No tenderness.  Abdominal:     General: Bowel sounds are normal. There is no distension.     Palpations: Abdomen is soft. There is no mass.     Tenderness: There is no abdominal tenderness. There is no guarding or rebound.  Genitourinary:    Penis: Normal. No tenderness.      Testes: Normal.     Prostate: Normal.     Rectum: Normal. Guaiac result negative.  Musculoskeletal:        General: No tenderness. Normal range of motion.     Cervical back: Neck supple.  Lymphadenopathy:     Cervical: No cervical adenopathy.  Skin:    General: Skin is warm and dry.     Coloration: Skin is not pale.     Findings: No erythema or rash.  Neurological:     Mental Status:  He is alert and oriented to person, place, and time.     Cranial Nerves: No cranial nerve deficit.     Motor: No abnormal muscle tone.     Coordination: Coordination normal.     Deep Tendon Reflexes: Reflexes are normal and symmetric. Reflexes normal.  Psychiatric:        Behavior: Behavior normal.        Thought Content: Thought content normal.        Judgment: Judgment normal.           Assessment & Plan:  His asthma and BPH and CKD are stable. His depression and anxiety are stable. We will set up an MRI to check the acoustic neuroma. Get fasting labs today. We will refer him to Nutrition to help with weight loss.  [name], MD

## 2020-06-05 LAB — HEPATIC FUNCTION PANEL
AG Ratio: 1.8 (calc) (ref 1.0–2.5)
ALT: 17 U/L (ref 9–46)
AST: 20 U/L (ref 10–35)
Albumin: 4 g/dL (ref 3.6–5.1)
Alkaline phosphatase (APISO): 71 U/L (ref 35–144)
Bilirubin, Direct: 0.1 mg/dL (ref 0.0–0.2)
Globulin: 2.2 g/dL (calc) (ref 1.9–3.7)
Indirect Bilirubin: 0.6 mg/dL (calc) (ref 0.2–1.2)
Total Bilirubin: 0.7 mg/dL (ref 0.2–1.2)
Total Protein: 6.2 g/dL (ref 6.1–8.1)

## 2020-06-05 LAB — CBC WITH DIFFERENTIAL/PLATELET
Absolute Monocytes: 421 cells/uL (ref 200–950)
Basophils Absolute: 79 cells/uL (ref 0–200)
Basophils Relative: 1.3 %
Eosinophils Absolute: 317 cells/uL (ref 15–500)
Eosinophils Relative: 5.2 %
HCT: 45.3 % (ref 38.5–50.0)
Hemoglobin: 15.3 g/dL (ref 13.2–17.1)
Lymphs Abs: 1080 cells/uL (ref 850–3900)
MCH: 31.1 pg (ref 27.0–33.0)
MCHC: 33.8 g/dL (ref 32.0–36.0)
MCV: 92.1 fL (ref 80.0–100.0)
MPV: 9.9 fL (ref 7.5–12.5)
Monocytes Relative: 6.9 %
Neutro Abs: 4203 cells/uL (ref 1500–7800)
Neutrophils Relative %: 68.9 %
Platelets: 195 10*3/uL (ref 140–400)
RBC: 4.92 10*6/uL (ref 4.20–5.80)
RDW: 11.7 % (ref 11.0–15.0)
Total Lymphocyte: 17.7 %
WBC: 6.1 10*3/uL (ref 3.8–10.8)

## 2020-06-05 LAB — BASIC METABOLIC PANEL WITH GFR
BUN/Creatinine Ratio: 10 (calc) (ref 6–22)
BUN: 16 mg/dL (ref 7–25)
CO2: 27 mmol/L (ref 20–32)
Calcium: 9.3 mg/dL (ref 8.6–10.3)
Chloride: 108 mmol/L (ref 98–110)
Creat: 1.56 mg/dL — ABNORMAL HIGH (ref 0.70–1.25)
GFR, Est African American: 52 mL/min/{1.73_m2} — ABNORMAL LOW (ref >=60)
GFR, Est Non African American: 45 mL/min/{1.73_m2} — ABNORMAL LOW (ref >=60)
Glucose, Bld: 84 mg/dL (ref 65–99)
Potassium: 4.3 mmol/L (ref 3.5–5.3)
Sodium: 140 mmol/L (ref 135–146)

## 2020-06-05 LAB — HEMOGLOBIN A1C
Hgb A1c MFr Bld: 4.9 % of total Hgb (ref <5.7)
Mean Plasma Glucose: 94 (calc)
eAG (mmol/L): 5.2 (calc)

## 2020-06-05 LAB — LIPID PANEL
Cholesterol: 179 mg/dL (ref <200)
HDL: 43 mg/dL (ref >=40)
LDL Cholesterol (Calc): 105 mg/dL (calc) — ABNORMAL HIGH
Non-HDL Cholesterol (Calc): 136 mg/dL (calc) — ABNORMAL HIGH (ref <130)
Total CHOL/HDL Ratio: 4.2 (calc) (ref <5.0)
Triglycerides: 191 mg/dL — ABNORMAL HIGH (ref <150)

## 2020-06-05 LAB — PSA: PSA: 1.02 ng/mL (ref <=4.0)

## 2020-06-05 LAB — TSH: TSH: 2.15 mIU/L (ref 0.40–4.50)

## 2020-06-09 DIAGNOSIS — N401 Enlarged prostate with lower urinary tract symptoms: Secondary | ICD-10-CM | POA: Diagnosis not present

## 2020-06-09 DIAGNOSIS — N138 Other obstructive and reflux uropathy: Secondary | ICD-10-CM | POA: Diagnosis not present

## 2020-06-11 ENCOUNTER — Telehealth: Payer: Self-pay | Admitting: Family Medicine

## 2020-06-11 DIAGNOSIS — N1831 Chronic kidney disease, stage 3a: Secondary | ICD-10-CM

## 2020-06-11 DIAGNOSIS — E785 Hyperlipidemia, unspecified: Secondary | ICD-10-CM

## 2020-06-11 NOTE — Telephone Encounter (Signed)
-----   Message from Viona Gilmore, [facility] sent at 06/03/2020  6:58 PM EST ----- Regarding: CCM referral Hi,  Could you please put in a CCM referral for Joseph Tran?   Thank you, Maddie

## 2020-06-18 NOTE — Progress Notes (Signed)
Normal except mild renal insufficiency which is stable

## 2020-06-24 ENCOUNTER — Other Ambulatory Visit: Payer: Medicare Other

## 2020-06-26 ENCOUNTER — Encounter: Payer: Medicare Other | Attending: Family Medicine | Admitting: Dietician

## 2020-06-26 ENCOUNTER — Encounter: Payer: Self-pay | Admitting: Dietician

## 2020-06-26 ENCOUNTER — Other Ambulatory Visit: Payer: Self-pay

## 2020-06-26 DIAGNOSIS — N1831 Chronic kidney disease, stage 3a: Secondary | ICD-10-CM | POA: Diagnosis not present

## 2020-06-26 DIAGNOSIS — Z6834 Body mass index (BMI) 34.0-34.9, adult: Secondary | ICD-10-CM | POA: Insufficient documentation

## 2020-06-26 DIAGNOSIS — E669 Obesity, unspecified: Secondary | ICD-10-CM | POA: Diagnosis not present

## 2020-06-26 NOTE — Patient Instructions (Signed)
Stop using the Lite salt Be mindful of sodium intake.  Read labels.  Avoid added salt.  Buy chicken that is fresh or frozen without salt. Continue to eat increased vegetables. Add fruit as desired. No need for a potassium restriction unless your potassium is high. Avoid foods with added Phos... in the ingredient list. Monitor your portion sizes of nuts. Aim for 3 meals daily. Protein goal about 65 grams daily. Continue to stay active.    Consider tracking your intake in My Fitness Pal  Resources: Kidney.org [employer] - recipes

## 2020-06-26 NOTE — Progress Notes (Signed)
Visit Length - 0915 am-10:15 am  Medical Nutrition Therapy  Patient is here today alone Primary concerns today:  He would like to learn what to eat to protect his kidneys and lose weight.  He sees Dr. [name], MD [facility] nephrologist).  He has been following a kidney diet per this nephrologist. He notes that one kidney is much smaller than it should be and the other is functioning at a lower level.   Referral diagnosis: CKD and obesity Preferred learning style:  no preference indicated Learning readiness: ready  NUTRITION ASSESSMENT   Anthropometrics  Weight 252 lbs 06/26/2020  States that he was extremely thin <150 lbs in his thirties and gained weight in his 70's in a very high stress job.  He has fought it since.  Started going to the gym 1-2 times daily and lost 75 lbs. Stopped going to the gym due to covid. Highest weight 285 lbs about 2010. He gained about 100 lbs when he was on an antidepressant but cannot remember the name of the medication.  Clinical Medical Hx: CKD, depression/anxiety, OSA on C-pap, COPD, blood clot right leg, constipation (chronic) Medications: see list Labs: 06/04/2020 BUN 16, Creatinin 1.56, Potassium 4.3, eGFR 45, no proteinuria noted, cholesterol 179, HDL 43, LDL 105, Triglycerides 191, A1C 4.9%  Lifestyle & Dietary Hx Patient lives alone.  He is retired Writer).  He enjoys working with horses.  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 He no longer rides as he had a horse accident about 4-5 years ago with a concussion.  Acoustic neuroma that effects balance. He has moved from [place], [place].  Estimated daily fluid intake: 84 oz Supplements: none Sleep: varies (takes Klonopin and melatonin).  He has nightmares.  Question if this is a cause of medications.  Sometimes goes to bed at 2 am.  Wakes at 3 am other mornings. Stress / self-care: controlled Current average weekly physical activity: 7 days per week (with horses)  about 2 hours.  24-Hr Dietary Recall  Avoids added salt.  Uses Lite salt at times First Meal: apple and cinnamon oatmeal, cranberries, walnuts OR strawberry banana lite yogurt with banana or berries Snack: unsalted peanuts Second Meal: similar to dinner OR tuna salad (made with pickle relish), crackers OR rare McDonald's sausage McMuffin without egg Snack: occasional cookies or chocolate  Third Meal: mixed vegetables, lemon pepper OR Kale salad, frozen chicken patty, red pepper, cucumber, tomato, cheese, spicy french dressing Snack:  popcorn Beverages: water, diet gingerale, cranberry juice, cherry juice  Estimated Energy Needs Calories: 1800 Protein: 60-65g  NUTRITION DIAGNOSIS  NB-1.1 Food and nutrition-related knowledge deficit As related to nutrition and CKD.  As evidenced by diet hx and patient report.   NUTRITION INTERVENTION  Nutrition education (E-1) on the following topics:  . Protein and CKD with recommendation to limit amounts of animal protein . Potassium with no need to restrict at this time as potassium levels are normal but recommended to avoid "Lite Salt" and other potassium containing seasonings. . Sodium - goals provided to be less than 2000 mg per day.  Reduce added salt, choose fresh or frozen ingredients without salt when possible.  Be mindful of overall sodium per meal. . Phosphorous - recommended avoidance of phos... additives on the food labels. . Resources for CKD  . Weight - evaluated usual intake.  Recommended overrestricting which can decrease metabolism.  Discussed an 8 hour window of eating and new research on Fasting Mimicking diet .  Nutrition adequacy to increase energy  Handouts Provided Include   NKD National kidney diet for those not on dialysis  Learning Style & Readiness for Change Teaching method utilized: Visual & Auditory  Demonstrated degree of understanding via: Teach Back  Barriers to learning/adherence to lifestyle change:  none  Goals Established by Pt  Consider tracking your intake in My Fitness Pal Continue mindfulness of choices Adequacy of nutrition Avoid "lite salt" and other potassium containing supplements Continue a lower protein diet Monitor portion sizes of nuts Stay active   MONITORING & EVALUATION Dietary intake, weekly physical activity, and f/u PRN.

## 2020-07-14 ENCOUNTER — Ambulatory Visit
Admission: RE | Admit: 2020-07-14 | Discharge: 2020-07-14 | Disposition: A | Discharge status: Home / Self Care | Payer: Medicare Other | Source: Ambulatory Visit | Attending: Family Medicine | Admitting: Family Medicine

## 2020-07-14 ENCOUNTER — Other Ambulatory Visit: Payer: Self-pay | Admitting: Family Medicine

## 2020-07-14 DIAGNOSIS — D333 Benign neoplasm of cranial nerves: Secondary | ICD-10-CM

## 2020-07-14 MED ORDER — GADOBENATE DIMEGLUMINE 529 MG/ML IV SOLN
20.0000 mL | Freq: Once | INTRAVENOUS | Status: AC | PRN
  Administered 2020-07-14: 20 mL via INTRAVENOUS

## 2020-08-20 ENCOUNTER — Telehealth: Payer: Self-pay | Admitting: Family Medicine

## 2020-08-20 NOTE — Telephone Encounter (Signed)
Spoke with patient to scheduled awv.  He stated he had mri done 07/14/20 and has seen results on my chart but does not understand would like a call back

## 2020-08-27 ENCOUNTER — Other Ambulatory Visit: Payer: Self-pay | Admitting: Family Medicine

## 2020-08-28 NOTE — Telephone Encounter (Signed)
Not original prescribing provider for this medication.  Last office visit- 06/04/2020 Last refill- 02/27/2020  No future appointment schedule. Can this patient receive a refill?

## 2020-09-04 ENCOUNTER — Encounter: Payer: Self-pay | Admitting: Hematology & Oncology

## 2020-09-04 ENCOUNTER — Inpatient Hospital Stay: Payer: Medicare Other | Attending: Hematology & Oncology | Admitting: Hematology & Oncology

## 2020-09-04 ENCOUNTER — Other Ambulatory Visit: Payer: Self-pay | Admitting: Hematology & Oncology

## 2020-09-04 ENCOUNTER — Inpatient Hospital Stay: Payer: Medicare Other

## 2020-09-04 ENCOUNTER — Other Ambulatory Visit: Payer: Self-pay

## 2020-09-04 ENCOUNTER — Telehealth: Payer: Self-pay

## 2020-09-04 VITALS — BP 148/80 | HR 72 | Temp 97.8°F | Resp 20 | Wt 253.8 lb

## 2020-09-04 DIAGNOSIS — I82531 Chronic embolism and thrombosis of right popliteal vein: Secondary | ICD-10-CM | POA: Insufficient documentation

## 2020-09-04 DIAGNOSIS — I82401 Acute embolism and thrombosis of unspecified deep veins of right lower extremity: Secondary | ICD-10-CM | POA: Diagnosis not present

## 2020-09-04 DIAGNOSIS — I82811 Embolism and thrombosis of superficial veins of right lower extremities: Secondary | ICD-10-CM | POA: Diagnosis not present

## 2020-09-04 DIAGNOSIS — Z7982 Long term (current) use of aspirin: Secondary | ICD-10-CM | POA: Insufficient documentation

## 2020-09-04 DIAGNOSIS — Z7901 Long term (current) use of anticoagulants: Secondary | ICD-10-CM | POA: Diagnosis not present

## 2020-09-04 DIAGNOSIS — D333 Benign neoplasm of cranial nerves: Secondary | ICD-10-CM | POA: Diagnosis not present

## 2020-09-04 DIAGNOSIS — Z79899 Other long term (current) drug therapy: Secondary | ICD-10-CM | POA: Diagnosis not present

## 2020-09-04 DIAGNOSIS — I82511 Chronic embolism and thrombosis of right femoral vein: Secondary | ICD-10-CM | POA: Insufficient documentation

## 2020-09-04 LAB — CBC WITH DIFFERENTIAL (CANCER CENTER ONLY)
Abs Immature Granulocytes: 0.03 10*3/uL (ref 0.00–0.07)
Basophils Absolute: 0.1 10*3/uL (ref 0.0–0.1)
Basophils Relative: 1 %
Eosinophils Absolute: 0.3 10*3/uL (ref 0.0–0.5)
Eosinophils Relative: 5 %
HCT: 44.7 % (ref 39.0–52.0)
Hemoglobin: 15 g/dL (ref 13.0–17.0)
Immature Granulocytes: 1 %
Lymphocytes Relative: 20 %
Lymphs Abs: 1.1 10*3/uL (ref 0.7–4.0)
MCH: 31.3 pg (ref 26.0–34.0)
MCHC: 33.6 g/dL (ref 30.0–36.0)
MCV: 93.1 fL (ref 80.0–100.0)
Monocytes Absolute: 0.3 10*3/uL (ref 0.1–1.0)
Monocytes Relative: 5 %
Neutro Abs: 3.8 10*3/uL (ref 1.7–7.7)
Neutrophils Relative %: 68 %
Platelet Count: 197 10*3/uL (ref 150–400)
RBC: 4.8 MIL/uL (ref 4.22–5.81)
RDW: 11.8 % (ref 11.5–15.5)
WBC Count: 5.6 10*3/uL (ref 4.0–10.5)
nRBC: 0 % (ref 0.0–0.2)

## 2020-09-04 LAB — CMP (CANCER CENTER ONLY)
ALT: 16 U/L (ref 0–44)
AST: 17 U/L (ref 15–41)
Albumin: 4.2 g/dL (ref 3.5–5.0)
Alkaline Phosphatase: 65 U/L (ref 38–126)
Anion gap: 6 (ref 5–15)
BUN: 27 mg/dL — ABNORMAL HIGH (ref 8–23)
CO2: 26 mmol/L (ref 22–32)
Calcium: 9.7 mg/dL (ref 8.9–10.3)
Chloride: 110 mmol/L (ref 98–111)
Creatinine: 1.8 mg/dL — ABNORMAL HIGH (ref 0.61–1.24)
GFR, Estimated: 40 mL/min — ABNORMAL LOW (ref >60)
Glucose, Bld: 119 mg/dL — ABNORMAL HIGH (ref 70–99)
Potassium: 4.4 mmol/L (ref 3.5–5.1)
Sodium: 142 mmol/L (ref 135–145)
Total Bilirubin: 0.6 mg/dL (ref 0.3–1.2)
Total Protein: 6.5 g/dL (ref 6.5–8.1)

## 2020-09-04 LAB — D-DIMER, QUANTITATIVE: D-Dimer, Quant: 0.5 ug/mL-FEU (ref 0.00–0.50)

## 2020-09-04 MED ORDER — DILTIAZEM HCL ER COATED BEADS 120 MG PO CP24: 120.0000 mg | ORAL_CAPSULE | Freq: Every day | ORAL | 1 refills | Status: DC

## 2020-09-04 NOTE — Progress Notes (Signed)
Hematology and Oncology Follow Up Visit  Joseph Tran [id number] [birth year] 70 y.o. 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  His main problem has been his blood pressure.  He has seen nephrology.  He has seen his family doctor.  He has yet to be put on any medication for his blood pressure.  He says blood pressure is quite high.  Today is 148/80.  He says he monitors this at home.  I suspect he probably will need to have something for blood pressure.  I will try him on Cardizem CD at 120 mg a day.  I think this would be reasonable.  He says he sees a nephrologist in a month.  They consider a monitor and manage his blood pressure at that point.  He had an MRI of the brain in January.  He does have an acoustic neuroma.  This is 2.5 x 3.5 mm.  It is in the left internal auditory canal.  I suppose this is been noted before.  He continues on Xarelto.  He is doing well with the Xarelto.  He is also on baby aspirin.  He is still involved with horses.  He may go to a horse show in April.  He is trying to lose weight.  He is having hard time losing weight.  Currently, his performance status is ECOG 1.  Medications:  Allergies as of 09/04/2020      Reactions   Allopurinol Other (See Comments)   PATIENT PREFERENCE Pt refused due to mom developing steven johnson syndrome.   Penicillins Hives   Has patient had a PCN reaction causing immediate rash, facial/tongue/throat swelling, SOB or lightheadedness with hypotension: Unknown Has patient had a PCN reaction causing severe rash involving mucus membranes or skin necrosis:Unknown Has patient had a PCN reaction that required hospitalization: No Has patient had a PCN reaction occurring within the last 10 years: No If all of the above  answers are "NO", then may proceed with Cephalosporin use.   Sulfamethoxazole Hives   Sulfonamide Derivatives Hives   Dilaudid [hydromorphone Hcl] Nausea And Vomiting      Medication List       Accurate as of September 04, 2020 10:24 AM. If you have any questions, ask your nurse or doctor.        acetaminophen 500 MG tablet Commonly known as: TYLENOL Take 1,000 mg by mouth as needed.   albuterol 108 (90 Base) MCG/ACT inhaler Commonly known as: ProAir HFA INHALE 2 PUFFS EVERY 4  HOURS AS NEEDED FOR  WHEEZING OR SHORTNESS OF  BREATH   aspirin EC 81 MG tablet Take 162 mg by mouth daily.   bisacodyl 5 MG EC tablet Commonly known as: DULCOLAX Take 5 mg by mouth at bedtime.   buPROPion 300 MG 24 hr tablet Commonly known as: WELLBUTRIN XL TAKE 1 TABLET BY MOUTH  DAILY   clonazePAM 1 MG tablet Commonly known as: KLONOPIN TAKE 1 TABLET BY MOUTH  TWICE DAILY AS NEEDED FOR  ANXIETY   desvenlafaxine 50 MG 24 hr tablet Commonly known as: PRISTIQ TAKE 1 TABLET BY MOUTH  DAILY What changed:   how much to take  how to take this  Another medication with the same name was removed. Continue taking this medication, and follow the directions you see  here.   diltiazem 2 % Gel Apply twice daily to rectum- may mix with OTC recticare   docusate sodium 100 MG capsule Commonly known as: COLACE Take 100 mg by mouth daily.   finasteride 5 MG tablet Commonly known as: PROSCAR Take 5 mg by mouth every morning.   Fluticasone-Salmeterol 250-50 MCG/DOSE Aepb Commonly known as: Advair Diskus Inhale 1 puff into the lungs in the morning and at bedtime.   furosemide 20 MG tablet Commonly known as: LASIX Take 1 tablet (20 mg total) by mouth daily.   GAMMA AMINOBUTYRIC ACID PO Take 750 mg by mouth daily. GABA   hydroxypropyl methylcellulose / hypromellose 2.5 % ophthalmic solution Commonly known as: ISOPTO TEARS / GONIOVISC Place 1-2 drops into both eyes 3 (three) times daily as needed for  dry eyes ((SCHEDULED EACH MORNING)).   Melatonin 3 MG Tbdp Take 3 mg by mouth at bedtime.   psyllium 58.6 % packet Commonly known as: METAMUCIL Take 1 packet by mouth daily.   tadalafil 5 MG tablet Commonly known as: Cialis Take 1 tablet (5 mg total) by mouth daily as needed for erectile dysfunction.   tamsulosin 0.4 MG Caps capsule Commonly known as: FLOMAX Take 0.4 mg by mouth at bedtime.   traMADol 50 MG tablet Commonly known as: ULTRAM Take 50 mg by mouth every 6 (six) hours as needed.   vitamin B-12 250 MCG tablet Commonly known as: CYANOCOBALAMIN Take 250 mcg by mouth daily.   Xarelto 20 MG Tabs tablet Generic drug: rivaroxaban TAKE ONE-HALF TABLET BY  MOUTH DAILY       Allergies:  Allergies  Allergen Reactions  . Allopurinol Other (See Comments)    PATIENT PREFERENCE Pt refused due to mom developing steven johnson syndrome.  [name] Penicillins Hives    Has patient had a PCN reaction causing immediate rash, facial/tongue/throat swelling, SOB or lightheadedness with hypotension: Unknown Has patient had a PCN reaction causing severe rash involving mucus membranes or skin necrosis:Unknown Has patient had a PCN reaction that required hospitalization: No Has patient had a PCN reaction occurring within the last 10 years: No If all of the above answers are "NO", then may proceed with Cephalosporin use.   . Sulfamethoxazole Hives  . Sulfonamide Derivatives Hives  . Dilaudid [Hydromorphone Hcl] Nausea And Vomiting    Past Medical History, Surgical history, Social history, and Family History were reviewed and updated.  Review of Systems: Review of Systems  Constitutional: Negative.   HENT: Negative.   Eyes: Negative.   Respiratory: Negative.   Cardiovascular: Negative.   Gastrointestinal: Negative.   Genitourinary: Negative.   Musculoskeletal: Negative.   Skin: Negative.   Neurological: Negative.   Endo/Heme/Allergies: Negative.   Psychiatric/Behavioral:  Negative.      Physical Exam:  weight is 253 lb 12.8 oz (115.1 kg). His oral temperature is 97.8 F (36.6 C). His blood pressure is 148/80 (abnormal) and his pulse is 72. His respiration is 20 and oxygen saturation is 98%.   Wt Readings from Last 3 Encounters:  09/04/20 253 lb 12.8 oz (115.1 kg)  06/26/20 254 lb (115.2 kg)  06/04/20 254 lb 12.8 oz (115.6 kg)    Physical Exam Vitals reviewed.  HENT:     Head: Normocephalic and atraumatic.     Mouth/Throat:     Mouth: Oropharynx is clear and moist.  Eyes:     Extraocular Movements: EOM normal.     Pupils: Pupils are equal, round, and reactive to light.  Cardiovascular:  Rate and Rhythm: Normal rate and regular rhythm.     Heart sounds: Normal heart sounds.  Pulmonary:     Effort: Pulmonary effort is normal.     Breath sounds: Normal breath sounds.  Abdominal:     General: Bowel sounds are normal.     Palpations: Abdomen is soft.  Musculoskeletal:        General: No tenderness, deformity or edema. Normal range of motion.     Cervical back: Normal range of motion.  Lymphadenopathy:     Cervical: No cervical adenopathy.  Skin:    General: Skin is warm and dry.     Findings: No erythema or rash.  Neurological:     Mental Status: He is alert and oriented to person, place, and time.  Psychiatric:        Mood and Affect: Mood and affect normal.        Behavior: Behavior normal.        Thought Content: Thought content normal.        Judgment: Judgment normal.      Lab Results  Component Value Date   WBC 5.6 09/04/2020   HGB 15.0 09/04/2020   HCT 44.7 09/04/2020   MCV 93.1 09/04/2020   PLT 197 09/04/2020   Lab Results  Component Value Date   FERRITIN 116 03/31/2017   IRON 101 03/31/2017   TIBC 286 03/31/2017   UIBC 185 03/31/2017   IRONPCTSAT 35 03/31/2017   Lab Results  Component Value Date   RBC 4.80 09/04/2020   No results found for: KPAFRELGTCHN, LAMBDASER, KAPLAMBRATIO No results found for:  Kandis Cocking, IGMSERUM No results found for: Odetta Pink, SPEI   Chemistry      Component Value Date/Time   NA 142 09/04/2020 0852   NA 145 06/29/2017 1452   K 4.4 09/04/2020 0852   K 4.7 06/29/2017 1452   CL 110 09/04/2020 0852   CL 107 06/29/2017 1452   CO2 26 09/04/2020 0852   CO2 25 06/29/2017 1452   BUN 27 (H) 09/04/2020 0852   BUN 33 (H) 06/29/2017 1452   CREATININE 1.80 (H) 09/04/2020 0852   CREATININE 1.56 (H) 06/04/2020 0950      Component Value Date/Time   CALCIUM 9.7 09/04/2020 0852   CALCIUM 9.5 06/29/2017 1452   ALKPHOS 65 09/04/2020 0852   ALKPHOS 83 06/29/2017 1452   AST 17 09/04/2020 0852   ALT 16 09/04/2020 0852   ALT 23 06/29/2017 1452   BILITOT 0.6 09/04/2020 0852       Impression and Plan: Mr. 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  He is on Xarelto.  Also on baby aspirin.  I think the 2 together is certainly a good combination for him.  Again his issues are certainly not oncologic or hematologic.  Since we have seen him today, we will help him out with the blood pressure.  I do not see that we have to do any scans on him.  I do not see that we have to do any Dopplers of the legs.  We will get her back in 3 months just to make sure we follow-up with the issues that are ongoing.   Volanda Napoleon, MD 3/3/202210:24 AM

## 2020-09-04 NOTE — Telephone Encounter (Signed)
appts made and printed for pt per 09/04/20 los, appts also avail in my chart    Joseph Tran

## 2020-09-22 ENCOUNTER — Telehealth: Payer: Self-pay | Admitting: Pharmacist

## 2020-09-22 NOTE — Chronic Care Management (AMB) (Addendum)
I left the patient a message about his upcoming appointment on 09/23/2020  @ 11:00 am with the clinical pharmacist. He was asked to please have all medication on hand to review with the pharmacist.  Neita Goodnight) Mare Ferrari, [place] [phone]  Patient called back to reschedule appt due to being out of town on 3/22. Rescheduled for 10/03/19.  Jeni Salles, PharmD [facility] Clinical Pharmacist [place] at [place]

## 2020-09-23 ENCOUNTER — Telehealth: Payer: Medicare Other

## 2020-09-24 NOTE — Telephone Encounter (Signed)
Attempted to call pt on his phone a few times, a busy signal through out, will try again later

## 2020-09-27 NOTE — Telephone Encounter (Signed)
Pt is scheduled for a visit with the pharmacist on 10/02/2020

## 2020-10-01 ENCOUNTER — Telehealth: Payer: Self-pay | Admitting: Pharmacist

## 2020-10-01 NOTE — Chronic Care Management (AMB) (Signed)
I left the patient a message about his upcoming appointment on 10/02/2020 @ 11:00 am with the clinical pharmacist. He was asked to please have all medication on hand to review with the pharmacist.   Neita Goodnight) Mare Ferrari, [place] [phone]

## 2020-10-02 ENCOUNTER — Telehealth: Payer: Medicare Other

## 2020-10-06 DIAGNOSIS — L821 Other seborrheic keratosis: Secondary | ICD-10-CM | POA: Diagnosis not present

## 2020-10-06 DIAGNOSIS — L905 Scar conditions and fibrosis of skin: Secondary | ICD-10-CM | POA: Diagnosis not present

## 2020-10-06 DIAGNOSIS — L570 Actinic keratosis: Secondary | ICD-10-CM | POA: Diagnosis not present

## 2020-10-06 DIAGNOSIS — D229 Melanocytic nevi, unspecified: Secondary | ICD-10-CM | POA: Diagnosis not present

## 2020-10-06 DIAGNOSIS — Z85828 Personal history of other malignant neoplasm of skin: Secondary | ICD-10-CM | POA: Diagnosis not present

## 2020-10-06 DIAGNOSIS — L814 Other melanin hyperpigmentation: Secondary | ICD-10-CM | POA: Diagnosis not present

## 2020-10-10 DIAGNOSIS — D649 Anemia, unspecified: Secondary | ICD-10-CM | POA: Diagnosis not present

## 2020-10-10 DIAGNOSIS — M109 Gout, unspecified: Secondary | ICD-10-CM | POA: Diagnosis not present

## 2020-10-10 DIAGNOSIS — N189 Chronic kidney disease, unspecified: Secondary | ICD-10-CM | POA: Diagnosis not present

## 2020-10-10 DIAGNOSIS — N183 Chronic kidney disease, stage 3 unspecified: Secondary | ICD-10-CM | POA: Diagnosis not present

## 2020-10-10 DIAGNOSIS — E790 Hyperuricemia without signs of inflammatory arthritis and tophaceous disease: Secondary | ICD-10-CM | POA: Diagnosis not present

## 2020-10-10 DIAGNOSIS — I10 Essential (primary) hypertension: Secondary | ICD-10-CM | POA: Diagnosis not present

## 2020-10-13 ENCOUNTER — Telehealth: Payer: Self-pay | Admitting: Pharmacist

## 2020-10-13 NOTE — Chronic Care Management (AMB) (Signed)
I left the patient a message about his upcoming appointment on 10/14/2020 @ 10:15 am with the clinical pharmacist. He was asked to please have all medication on hand to review with the pharmacist.   Neita Goodnight) Mare Ferrari, [place] [phone]

## 2020-10-14 ENCOUNTER — Ambulatory Visit (INDEPENDENT_AMBULATORY_CARE_PROVIDER_SITE_OTHER): Payer: Medicare Other | Admitting: Pharmacist

## 2020-10-14 ENCOUNTER — Other Ambulatory Visit: Payer: Self-pay | Admitting: Hematology & Oncology

## 2020-10-14 DIAGNOSIS — I82401 Acute embolism and thrombosis of unspecified deep veins of right lower extremity: Secondary | ICD-10-CM

## 2020-10-14 DIAGNOSIS — F418 Other specified anxiety disorders: Secondary | ICD-10-CM

## 2020-10-14 DIAGNOSIS — N138 Other obstructive and reflux uropathy: Secondary | ICD-10-CM | POA: Diagnosis not present

## 2020-10-14 DIAGNOSIS — N401 Enlarged prostate with lower urinary tract symptoms: Secondary | ICD-10-CM | POA: Diagnosis not present

## 2020-10-14 NOTE — Progress Notes (Signed)
Chronic Care Management Pharmacy Note  10/29/2020 Name:  Joseph Tran MRN:  [id number] DOB:  Jun 02, 1951  Subjective: Joseph Tran is an 70 y.o. year old male who is a primary patient of [name], MD.  The CCM team was consulted for assistance with disease management and care coordination needs.    Engaged with patient by telephone for follow up visit in response to provider referral for pharmacy case management and/or care coordination services.   Consent to Services:  The patient was given information about Chronic Care Management services, agreed to services, and gave verbal consent prior to initiation of services.  Please see initial visit note for detailed documentation.   Patient Care Team: [name], MD as PCP - General Marin Olp, Rudell Cobb, MD as Consulting Physician (Oncology) Viona Gilmore, [facility] as Pharmacist (Pharmacist)  Recent office visits: 06/04/20 [name], MD: Patient presented for annual exam.   Recent consult visits: 10/10/20 [name], MD (nephrology): Patient presented for follow up. Uric acid elevated, no changes made.  09/04/20 [name], MD (hem/oncology): Patient presented for DVT follow up. Prescribed diltiazem 120 mg daily for BP.  06/26/20 Antonieta Iba, RD (nutrition): Patient presented for CKD nutrition visit.  06/09/20 Heather Roberts, MD (urology): Patient presented for BPH follow up.  02/27/20 Zenovia Jarred, MD (gastroenterology): Patient presented for colon cancer screening evaluation. Plan to repeat colonoscopy and hold Xarelto prior. Prescribed diltiazem gel and Recticare for hemorrhoids and anal fissures.  12/12/19 Sherrilyn Rist, MD (pulmonary): Patient presented for OSA on CPAP follow up. Encouraged patient to find out if there is a preferred medication cheaper than Advair. Follow up in 6 months.  Hospital visits: None in previous 6 months  Objective:  Lab Results  Component Value Date   CREATININE 1.80 (H) 09/04/2020   BUN 27  (H) 09/04/2020   GFR 43.84 (L) 05/30/2019   GFRNONAA 40 (L) 09/04/2020   GFRAA 52 (L) 06/04/2020   NA 142 09/04/2020   K 4.4 09/04/2020   CALCIUM 9.7 09/04/2020   CO2 26 09/04/2020   GLUCOSE 119 (H) 09/04/2020    Lab Results  Component Value Date/Time   HGBA1C 4.9 06/04/2020 09:50 AM   HGBA1C 5.1 05/30/2019 09:03 AM   GFR 43.84 (L) 05/30/2019 09:03 AM   GFR 38.80 (L) 09/20/2017 08:10 AM    Last diabetic Eye exam: No results found for: HMDIABEYEEXA  Last diabetic Foot exam: No results found for: HMDIABFOOTEX   Lab Results  Component Value Date   CHOL 179 06/04/2020   HDL 43 06/04/2020   LDLCALC 105 (H) 06/04/2020   TRIG 191 (H) 06/04/2020   CHOLHDL 4.2 06/04/2020    Hepatic Function Latest Ref Rng & Units 09/04/2020 06/04/2020 03/07/2020  Total Protein 6.5 - 8.1 g/dL 6.5 6.2 6.2(L)  Albumin 3.5 - 5.0 g/dL 4.2 - 3.9  AST 15 - 41 U/L 17 20 14(L)  ALT 0 - 44 U/L 16 17 13   Alk Phosphatase 38 - 126 U/L 65 - 69  Total Bilirubin 0.3 - 1.2 mg/dL 0.6 0.7 0.5  Bilirubin, Direct 0.0 - 0.2 mg/dL - 0.1 -    Lab Results  Component Value Date/Time   TSH 2.15 06/04/2020 09:50 AM   TSH 1.63 05/30/2019 09:03 AM    CBC Latest Ref Rng & Units 09/04/2020 06/04/2020 03/07/2020  WBC 4.0 - 10.5 K/uL 5.6 6.1 5.6  Hemoglobin 13.0 - 17.0 g/dL 15.0 15.3 14.2  Hematocrit 39.0 - 52.0 % 44.7 45.3 42.5  Platelets 150 - 400 K/uL 197 195 172    No results found for: VD25OH  Clinical ASCVD: No  The 10-year ASCVD risk score Mikey Bussing DC Jr., et al., 2013) is: 22.8%   Values used to calculate the score:     Age: 43 years     Sex: Male     Is Non-Hispanic African American: No     Diabetic: No     Tobacco smoker: No     Systolic Blood Pressure: 403 mmHg     Is BP treated: Yes     HDL Cholesterol: 43 mg/dL     Total Cholesterol: 179 mg/dL    Depression screen [facility] 2/9 06/26/2020 06/04/2020  Decreased Interest 0 0  Down, Depressed, Hopeless 0 2  PHQ - 2 Score 0 2  Altered sleeping - 2  Tired,  decreased energy - 1  Change in appetite - 0  Feeling bad or failure about yourself  - 1  Trouble concentrating - 0  Moving slowly or fidgety/restless - 0  Suicidal thoughts - 0  PHQ-9 Score - 6  Difficult doing work/chores - Somewhat difficult      Social History   Tobacco Use  Smoking Status Never Smoker  Smokeless Tobacco Never Used  Tobacco Comment   NEVER USED TOBACCO   BP Readings from Last 3 Encounters:  09/04/20 (!) 148/80  06/04/20 (!) 142/80  04/07/20 138/77   Pulse Readings from Last 3 Encounters:  09/04/20 72  06/04/20 (!) 52  04/07/20 62   Wt Readings from Last 3 Encounters:  09/04/20 253 lb 12.8 oz (115.1 kg)  06/26/20 254 lb (115.2 kg)  06/04/20 254 lb 12.8 oz (115.6 kg)   BMI Readings from Last 3 Encounters:  09/04/20 34.42 kg/m  06/26/20 34.45 kg/m  06/04/20 34.56 kg/m    Assessment/Interventions: Review of patient past medical history, allergies, medications, health status, including review of consultants reports, laboratory and other test data, was performed as part of comprehensive evaluation and provision of chronic care management services.   SDOH:  (Social Determinants of Health) assessments and interventions performed: Yes  SDOH Screenings   Alcohol Screen: Not on file  Depression (PHQ2-9): Low Risk   . PHQ-2 Score: 0  Financial Resource Strain: Low Risk   . Difficulty of Paying Living Expenses: Not very hard  Food Insecurity: Not on file  Housing: Not on file  Physical Activity: Not on file  Social Connections: Not on file  Stress: Not on file  Tobacco Use: Low Risk   . Smoking Tobacco Use: Never Smoker  . Smokeless Tobacco Use: Never Used  Transportation Needs: No Transportation Needs  . Lack of Transportation (Medical): No  . Lack of Transportation (Non-Medical): No    CCM Care Plan  Allergies  Allergen Reactions  . Allopurinol Other (See Comments)    PATIENT PREFERENCE Pt refused due to mom developing steven johnson  syndrome.  [name] Penicillins Hives    Has patient had a PCN reaction causing immediate rash, facial/tongue/throat swelling, SOB or lightheadedness with hypotension: Unknown Has patient had a PCN reaction causing severe rash involving mucus membranes or skin necrosis:Unknown Has patient had a PCN reaction that required hospitalization: No Has patient had a PCN reaction occurring within the last 10 years: No If all of the above answers are "NO", then may proceed with Cephalosporin use.   . Sulfamethoxazole Hives  . Sulfonamide Derivatives Hives  . Dilaudid [Hydromorphone Hcl] Nausea And Vomiting    Medications Reviewed Today  Reviewed by Cottie Banda, RN (Registered Nurse) on 09/04/20 at [place] List Status: <None>  Medication Order Taking? Sig Documenting Provider Last Dose Status Informant  acetaminophen (TYLENOL) 500 MG tablet [id number]  Take 1,000 mg by mouth as needed.  [provider]  Active   albuterol (PROAIR HFA) 108 (90 Base) MCG/ACT inhaler [id number]  INHALE 2 PUFFS EVERY 4  HOURS AS NEEDED FOR  WHEEZING OR SHORTNESS OF  BREATH  Patient taking differently: INHALE 2 PUFFS EVERY 4  HOURS AS NEEDED FOR  WHEEZING OR SHORTNESS OF  Damian Leavell, MD  Active   aspirin EC 81 MG tablet [id number]  Take 162 mg by mouth daily. [provider]  Active Self           Med Note ([place], PATTI E   Wed Feb 27, 2020  8:43 AM)    bisacodyl (DULCOLAX) 5 MG EC tablet [id number]  Take 5 mg by mouth at bedtime. [provider]  Active Self  buPROPion (WELLBUTRIN XL) 300 MG 24 hr tablet [id number]  TAKE 1 TABLET BY MOUTH  DAILY [name], MD  Active   clonazePAM (KLONOPIN) 1 MG tablet [id number]  TAKE 1 TABLET BY MOUTH  TWICE DAILY AS NEEDED FOR  ANXIETY [name], MD  Active   desvenlafaxine (PRISTIQ) 50 MG 24 hr tablet [id number]  TAKE 1 TABLET BY MOUTH  DAILY [name], MD  Active   Desvenlafaxine Succinate ER 25 MG TB24 [id number]  Take 1 tablet by  mouth daily. [provider]  Active   diltiazem 2 % GEL [id number]  Apply twice daily to rectum- may mix with OTC recticare Pyrtle, Lajuan Lines, MD  Active   docusate sodium (COLACE) 100 MG capsule [id number]  Take 100 mg by mouth daily. [provider]  Active   finasteride (PROSCAR) 5 MG tablet [id number]  Take 5 mg by mouth every morning.  [provider]  Active Self  Fluticasone-Salmeterol (ADVAIR DISKUS) 250-50 MCG/DOSE AEPB [id number]  Inhale 1 puff into the lungs in the morning and at bedtime.  Patient not taking: Reported on 06/26/2020   Laurin Coder, MD  Active   furosemide (LASIX) 20 MG tablet [id number]  Take 1 tablet (20 mg total) by mouth daily.  Patient taking differently: Take 20 mg by mouth daily as needed.   Volanda Napoleon, MD  Active   GAMMA AMINOBUTYRIC ACID PO [id number]  Take 750 mg by mouth daily. GABA [provider]  Active Self  hydroxypropyl methylcellulose / hypromellose (ISOPTO TEARS / GONIOVISC) 2.5 % ophthalmic solution [id number]  Place 1-2 drops into both eyes 3 (three) times daily as needed for dry eyes ((SCHEDULED EACH MORNING)). [provider]  Active Self  Melatonin 3 MG TBDP [id number]  Take 3 mg by mouth at bedtime. [provider]  Active Self  psyllium (METAMUCIL) 58.6 % packet [id number]  Take 1 packet by mouth daily. [provider]  Active   tadalafil (CIALIS) 5 MG tablet [id number]  Take 1 tablet (5 mg total) by mouth daily as needed for erectile dysfunction. [name], MD  Active   tamsulosin (FLOMAX) 0.4 MG CAPS [id number]  Take 0.4 mg by mouth at bedtime.  [provider]  Active Self  traMADol (ULTRAM) 50 MG tablet [id number]  Take 50 mg by mouth every 6 (six) hours as needed. [provider]  Active   vitamin B-12 (CYANOCOBALAMIN) 250 MCG tablet [id number]  Take 250 mcg by mouth daily.  Patient not taking: No sig reported   [provider]  Active   XARELTO 20 MG  TABS tablet [id number]  TAKE ONE-HALF TABLET BY  MOUTH DAILY Ennever, Rudell Cobb, MD  Active   Med List Note Marylynn Pearson 04/19/17 1749): CPAP WITH SLEEP.          Patient Active Problem List   Diagnosis Date Noted  . Hyperglycemia 05/30/2019  . Primary osteoarthritis of right knee 01/19/2019  . CKD (chronic kidney disease), stage III ([place]) 09/13/2017  . Renal atrophy, left 09/13/2017  . Systemic lupus erythematosus ([place]) 09/13/2017  . Dyslipidemia 09/13/2017  . Acute meniscal tear, lateral, right, initial encounter 04/25/2017  . Osteoarthritis of right knee 04/25/2017  . Concussion with loss of consciousness 08/05/2015  . Laceration of spleen 08/05/2015  . Lumbar stress fracture 08/05/2015  . DVT (deep venous thrombosis) ([place]) 10/18/2014  . Hx of bacterial pneumonia 03/10/2013  . COLONIC POLYPS, HX OF 04/08/2010  . NEPHROLITHIASIS, HX OF 04/08/2010  . ACOUSTIC NEUROMA 04/07/2010  . Hypogonadism male 04/07/2010  . Depression with anxiety 04/07/2010  . SLEEP APNEA, OBSTRUCTIVE 04/07/2010  . Asthma 04/07/2010  . 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  . 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 PLANTAR FASCIITIS 04/07/2010    Immunization History  Administered Date(s) Administered  . Fluad Quad(high Dose 65+) 03/24/2019  . Influenza Split 04/02/2011, 03/16/2012  . Influenza, High Dose Seasonal PF 03/25/2017, 04/11/2018  . Influenza,inj,Quad PF,6+ Mos 03/27/2013, 03/25/2014, 04/22/2015, 04/28/2016  . Influenza-Unspecified 04/30/2020  . PFIZER(Purple Top)SARS-COV-2 Vaccination 08/19/2019, 09/11/2019, 05/09/2020  . Pneumococcal Conjugate-13 04/28/2016  . Pneumococcal Polysaccharide-23 07/20/2001, 07/20/2005  . Tdap 04/02/2011, 08/02/2015  . Zoster 08/10/2013  . Zoster Recombinat (Shingrix) 08/28/2020    Conditions to be addressed/monitored:  Hypertension, Asthma, Depression, Anxiety, Osteoarthritis, BPH and DVT, fluid retention, and constipation  Care Plan :  [place]  Updates made by Viona Gilmore, [place] since 10/29/2020 12:00 AM    Problem: Problem: Hypertension, Asthma, Depression, Anxiety, Osteoarthritis, BPH and DVT, fluid retention, and constipation     Long-Range Goal: Patient-Specific Goal   Start Date: 10/14/2020  Expected End Date: 10/14/2021  This Visit's Progress: On track  Priority: High  Note:   Current Barriers:  . Unable to independently monitor therapeutic efficacy . Unable to achieve control of blood pressure   Pharmacist Clinical Goal(s):  [name] Patient will achieve adherence to monitoring guidelines and medication adherence to achieve therapeutic efficacy . achieve control of blood pressure as evidenced by home blood pressure readings  through collaboration with PharmD and provider.   Interventions: . 1:1 collaboration with [name], MD regarding development and update of comprehensive plan of care as evidenced by provider attestation and co-signature . Inter-disciplinary care team collaboration (see longitudinal plan of care) . Comprehensive medication review performed; medication list updated in electronic medical record  Hypertension (BP goal <130/80) -Uncontrolled -Current treatment: . Diltiazem 120 mg 1 capsule daily -Medications previously tried: none  -Current home readings: 124/71, 131/84, 156/82, 133/84, 151/82, 142/92, 127/73, 142/89  -Current dietary habits: staying away from salt (doesn't add to anything and looks at sodium content of everything) and potassium -Current exercise habits: not going to the gym with COVID, active 4-5 hours a day taking -Denies hypotensive/hypertensive symptoms -Educated on Exercise goal of 150 minutes per week; Importance of home blood pressure monitoring; Proper BP monitoring technique; -Counseled to monitor BP at home weekly, document, and provide log at  future appointments -Counseled on diet and exercise extensively Recommended to continue current  medication  Asthma/sleep apnea (Goal: control symptoms) -Controlled -Current treatment   Advair 250-50 mcg/dose 1 puff BID   Albuterol 108 mcg/act 1 puff PRN (2 times a week) -Medications previously tried: none  -Exacerbations requiring treatment in last 6 months: none -Patient denies consistent use of maintenance inhaler -Frequency of rescue inhaler use: twice weekly -Counseled on Benefits of consistent maintenance inhaler use Differences between maintenance and rescue inhalers -Recommended to continue current medication Recommended spirometry testing Patient does not use Advair or wish to use it as his cough has resolved  Depression/Anxiety (Goal: minimize symptoms) -Controlled -Current treatment:  Bupropion XL 300 mg 1 tablet daily  Clonazepam 1 mg 1 tablet twice daily as needed for anxiety (taking 1/2 tablet before bed)  Pristiq 25 mg 1 tablet daily  -Medications previously tried/failed: Lexapro (unknown), Effexor (unknown) -PHQ9: 0 -GAD7: n/a -Connected with behavioral health [phone]) for mental health support -Educated on Benefits of medication for symptom control Benefits of cognitive-behavioral therapy with or without medication -Counseled on long term risks of taking benzodiazepines and patient reported nightmares have lessened since decreasing dose   DVT history (Goal: prevent future blood clots) -Controlled -Current treatment   Xarelto 10 mg 1 tablet daily  Aspirin 81 mg 2 tablets daily -Medications previously tried: none  -Counseled on monitoring for signs of bleeding such as unexplained and excessive bleeding from a cut or injury, easy or excessive bruising, blood in urine or stools, and nosebleeds without a known cause  BPH (Goal: minimize symptoms) -Controlled -Current treatment   Finasteride 5 mg 1 tablet daily (in AM)  Tamsulosin 0.4 mg 1 capsule daily (in PM)  Cialis 5 mg 1 tablet daily as needed -Medications previously tried: none   -Recommended to continue current medication Counseled on consistent administration of tamsulosin after food  Fluid retention (Goal: minimize swelling) -Controlled -Current treatment  . Furosemide 20 mg 1 tablet daily or as needed -Medications previously tried: none  - Patient only takes if right ankle swells with salt in diet and he does watch his salt intake and uses low sodium salt morton lite  Osteoarthritis (Goal: minimize pain) -Controlled -Current treatment   Tramadol 50 mg 1 tablet as needed  Acetaminophen 500 mg 2 tablets as needed -Medications previously tried: none  -Recommended to continue current medication  Constipation (Goal: regular bowel movements) -Controlled -Current treatment   Bisacodyl 5 mg 1 tablet at bedtime  Sennakot 1 tablet daily  Metamucil 1 spoonful daily -Medications previously tried: none  -Recommended to continue current medication  Health Maintenance -Vaccine gaps: Prevnar 20 or Pneumovax 23, second dose of shingrix -Current therapy:   GABBA 750 mg 1 tablet daily in evening   Melatonin 3 mg 1 tablet at bedtime   Diltiazem gel 2% once to twice daily  Isopto tears 1 drop 1-2 times a day (artificial tears) -Educated on Cost vs benefit of each product must be carefully weighed by individual consumer -Patient is satisfied with current therapy and denies issues -Recommended to continue current medication  Patient Goals/Self-Care Activities . Patient will:  - take medications as prescribed check blood pressure twice weekly, document, and provide at future appointments target a minimum of 150 minutes of moderate intensity exercise weekly  Follow Up Plan: Telephone follow up appointment with care management team member scheduled for: 4-5 months       Medication Assistance: None required.  Patient affirms current coverage meets needs.  Patient's  preferred pharmacy is:  [facility], [place],  [address] [place], [address] [place] [address] Phone: [phone] Fax: [id number] - [place], [place] - [address] [place] [address] AT [place] [address] [place] [place] [address] Phone: [phone] Fax: [phone]  [place], [place] [address] Phone: [phone] Fax: [phone]  Uses pill box? No - uses a shelf on his kitchen cabinet separated into morning and evening Pt endorses 100% compliance  We discussed: Current pharmacy is preferred with insurance plan and patient is satisfied with pharmacy services Patient decided to: Continue current medication management strategy  Care Plan and Follow Up Patient Decision:  Patient agrees to Care Plan and Follow-up.  Plan: Telephone follow up appointment with care management team member scheduled for:  4 months  Jeni Salles, PharmD [place] Pharmacist [place] [facility] [place] [phone]

## 2020-10-24 ENCOUNTER — Telehealth: Payer: Self-pay | Admitting: Family Medicine

## 2020-10-24 NOTE — Telephone Encounter (Signed)
Yes he should get the fourth shot

## 2020-10-24 NOTE — Telephone Encounter (Signed)
The patient was wanting to know if he should get the second COVID booster since he has asthma.  Please advise

## 2020-10-27 NOTE — Telephone Encounter (Signed)
Spoke with patient, message given.    

## 2020-10-29 NOTE — Patient Instructions (Addendum)
Hi Joseph Tran,  It was great to get to speak with you again! Below is a summary of some of the topics we discussed. Don't forget to keep on checking your blood pressures a couple times a week to make sure they are in a good range.  Please reach out to me if you have any questions or need anything before our follow up!  Best, Maddie  Jeni Salles, PharmD, [facility]  Visit Information  Goals Addressed   None    Patient Care Plan: CCM Pharmacy Care Plan    Problem Identified: Problem: Hypertension, Asthma, Depression, Anxiety, Osteoarthritis, BPH and DVT, fluid retention, and constipation     Long-Range Goal: Patient-Specific Goal   Start Date: 10/14/2020  Expected End Date: 10/14/2021  This Visit's Progress: On track  Priority: High  Note:   Current Barriers:  . Unable to independently monitor therapeutic efficacy . Unable to achieve control of blood pressure   Pharmacist Clinical Goal(s):  [name] Patient will achieve adherence to monitoring guidelines and medication adherence to achieve therapeutic efficacy . achieve control of blood pressure as evidenced by home blood pressure readings  through collaboration with PharmD and provider.   Interventions: . 1:1 collaboration with [name], MD regarding development and update of comprehensive plan of care as evidenced by provider attestation and co-signature . Inter-disciplinary care team collaboration (see longitudinal plan of care) . Comprehensive medication review performed; medication list updated in electronic medical record  Hypertension (BP goal <130/80) -Uncontrolled -Current treatment: . Diltiazem 120 mg 1 capsule daily -Medications previously tried: none  -Current home readings: 124/71, 131/84, 156/82, 133/84, 151/82, 142/92, 127/73, 142/89  -Current dietary habits: staying away from salt (doesn't add to anything and looks at sodium content of everything) and  potassium -Current exercise habits: not going to the gym with COVID, active 4-5 hours a day taking -Denies hypotensive/hypertensive symptoms -Educated on Exercise goal of 150 minutes per week; Importance of home blood pressure monitoring; Proper BP monitoring technique; -Counseled to monitor BP at home weekly, document, and provide log at future appointments -Counseled on diet and exercise extensively Recommended to continue current medication  Asthma/sleep apnea (Goal: control symptoms) -Controlled -Current treatment   Advair 250-50 mcg/dose 1 puff BID   Albuterol 108 mcg/act 1 puff PRN (2 times a week) -Medications previously tried: none  -Exacerbations requiring treatment in last 6 months: none -Patient denies consistent use of maintenance inhaler -Frequency of rescue inhaler use: twice weekly -Counseled on Benefits of consistent maintenance inhaler use Differences between maintenance and rescue inhalers -Recommended to continue current medication Recommended spirometry testing Patient does not use Advair or wish to use it as his cough has resolved  Depression/Anxiety (Goal: minimize symptoms) -Controlled -Current treatment:  Bupropion XL 300 mg 1 tablet daily  Clonazepam 1 mg 1 tablet twice daily as needed for anxiety (taking 1/2 tablet before bed)  Pristiq 25 mg 1 tablet daily  -Medications previously tried/failed: Lexapro (unknown), Effexor (unknown) -PHQ9: 0 -GAD7: n/a -Connected with behavioral health [phone] for mental health support -Educated on Benefits of medication for symptom control Benefits of cognitive-behavioral therapy with or without medication -Counseled on long term risks of taking benzodiazepines and patient reported nightmares have lessened since decreasing dose   DVT history (Goal: prevent future blood clots) -Controlled -Current treatment   Xarelto 10 mg 1 tablet daily  Aspirin 81 mg 2 tablets daily -Medications previously tried:  none  -Counseled on monitoring for signs of bleeding such  as unexplained and excessive bleeding from a cut or injury, easy or excessive bruising, blood in urine or stools, and nosebleeds without a known cause  BPH (Goal: minimize symptoms) -Controlled -Current treatment   Finasteride 5 mg 1 tablet daily (in AM)  Tamsulosin 0.4 mg 1 capsule daily (in PM)  Cialis 5 mg 1 tablet daily as needed -Medications previously tried: none  -Recommended to continue current medication Counseled on consistent administration of tamsulosin after food  Fluid retention (Goal: minimize swelling) -Controlled -Current treatment  . Furosemide 20 mg 1 tablet daily or as needed -Medications previously tried: none  - Patient only takes if right ankle swells with salt in diet and he does watch his salt intake and uses low sodium salt morton lite  Osteoarthritis (Goal: minimize pain) -Controlled -Current treatment   Tramadol 50 mg 1 tablet as needed  Acetaminophen 500 mg 2 tablets as needed -Medications previously tried: none  -Recommended to continue current medication  Constipation (Goal: regular bowel movements) -Controlled -Current treatment   Bisacodyl 5 mg 1 tablet at bedtime  Sennakot 1 tablet daily  Metamucil 1 spoonful daily -Medications previously tried: none  -Recommended to continue current medication  Health Maintenance -Vaccine gaps: Prevnar 20 or Pneumovax 23, second dose of shingrix -Current therapy:   GABBA 750 mg 1 tablet daily in evening   Melatonin 3 mg 1 tablet at bedtime   Diltiazem gel 2% once to twice daily  Isopto tears 1 drop 1-2 times a day (artificial tears) -Educated on Cost vs benefit of each product must be carefully weighed by individual consumer -Patient is satisfied with current therapy and denies issues -Recommended to continue current medication  Patient Goals/Self-Care Activities . Patient will:  - take medications as prescribed check blood  pressure twice weekly, document, and provide at future appointments target a minimum of 150 minutes of moderate intensity exercise weekly  Follow Up Plan: Telephone follow up appointment with care management team member scheduled for: 4-5 months       Patient verbalizes understanding of instructions provided today and agrees to view in [place].  Telephone follow up appointment with pharmacy team member scheduled for:  Viona Gilmore, [facility]   How to Take Your Blood Pressure Blood pressure measures how strongly your blood is pressing against the walls of your arteries. Arteries are blood vessels that carry blood from your heart throughout your body. You can take your blood pressure at home with a machine. You may need to check your blood pressure at home:  To check if you have high blood pressure (hypertension).  To check your blood pressure over time.  To make sure your blood pressure medicine is working. Supplies needed:  Blood pressure machine, or monitor.  Dining room chair to sit in.  Table or desk.  Small notebook.  Pencil or pen. How to prepare Avoid these things for 30 minutes before checking your blood pressure:  Having drinks with caffeine in them, such as coffee or tea.  Drinking alcohol.  Eating.  Smoking.  Exercising. Do these things five minutes before checking your blood pressure:  Go to the bathroom and pee (urinate).  Sit in a dining chair. Do not sit in a soft couch or an armchair.  Be quiet. Do not talk. How to take your blood pressure Follow the instructions that came with your machine. If you have a digital blood pressure monitor, these may be the instructions: 1. Sit up straight. 2. Place your feet on the floor. Do  not cross your ankles or legs. 3. Rest your left arm at the level of your heart. You may rest it on a table, desk, or chair. 4. Pull up your shirt sleeve. 5. Wrap the blood pressure cuff around the upper part of your left arm.  The cuff should be 1 inch (2.5 cm) above your elbow. It is best to wrap the cuff around bare skin. 6. Fit the cuff snugly around your arm. You should be able to place only one finger between the cuff and your arm. 7. Place the cord so that it rests in the bend of your elbow. 8. Press the power button. 9. Sit quietly while the cuff fills with air and loses air. 10. Write down the numbers on the screen. 11. Wait 2-3 minutes and then repeat steps 1-10.   What do the numbers mean? Two numbers make up your blood pressure. The first number is called systolic pressure. The second is called diastolic pressure. An example of a blood pressure reading is "120 over 80" (or 120/80). If you are an adult and do not have a medical condition, use this guide to find out if your blood pressure is normal: Normal  First number: below 120.  Second number: below 80. Elevated  First number: 120-129.  Second number: below 80. Hypertension stage 1  First number: 130-139.  Second number: 80-89. Hypertension stage 2  First number: 140 or above.  Second number: 2 or above. Your blood pressure is above normal even if only the top or bottom number is above normal. Follow these instructions at home:  Check your blood pressure as often as your doctor tells you to.  Check your blood pressure at the same time every day.  Take your monitor to your next doctor's appointment. Your doctor will: ? Make sure you are using it correctly. ? Make sure it is working right.  Make sure you understand what your blood pressure numbers should be.  Tell your doctor if your medicine is causing side effects.  Keep all follow-up visits as told by your doctor. This is important. General tips:  You will need a blood pressure machine, or monitor. Your doctor can suggest a monitor. You can buy one at a drugstore or online. When choosing one: ? Choose one with an arm cuff. ? Choose one that wraps around your upper arm. Only  one finger should fit between your arm and the cuff. ? Do not choose one that measures your blood pressure from your wrist or finger. Where to find more information American Heart Association: www.heart.org Contact a doctor if:  Your blood pressure keeps being high. Get help right away if:  Your first blood pressure number is higher than 180.  Your second blood pressure number is higher than 120. Summary  Check your blood pressure at the same time every day.  Avoid caffeine, alcohol, smoking, and exercise for 30 minutes before checking your blood pressure.  Make sure you understand what your blood pressure numbers should be. This information is not intended to replace advice given to you by your health care provider. Make sure you discuss any questions you have with your health care provider. Document Revised: 06/15/2019 Document Reviewed: 06/15/2019 Elsevier Patient Education  2021 [employer].

## 2020-10-30 ENCOUNTER — Ambulatory Visit: Payer: Medicare Other

## 2020-11-03 ENCOUNTER — Ambulatory Visit: Payer: Medicare Other | Attending: Internal Medicine

## 2020-11-03 ENCOUNTER — Other Ambulatory Visit (HOSPITAL_BASED_OUTPATIENT_CLINIC_OR_DEPARTMENT_OTHER): Payer: Self-pay

## 2020-11-03 DIAGNOSIS — Z23 Encounter for immunization: Secondary | ICD-10-CM | POA: Diagnosis not present

## 2020-11-03 MED ORDER — PFIZER-BIONT COVID-19 VAC-TRIS 30 MCG/0.3ML IM SUSP
INTRAMUSCULAR | 0 refills | Status: DC
  Filled 2020-11-03: qty 0.3, 1d supply, fill #0

## 2020-11-03 NOTE — Progress Notes (Signed)
   Covid-19 Vaccination Clinic  Name:  JAMAIL CULLERS    MRN: [id number] DOB: 03-13-1951  11/03/2020  Mr. Vickrey was observed post Covid-19 immunization for 15 minutes without incident. He was provided with Vaccine Information Sheet and instruction to access the V-Safe system.   Mr. Keatts was instructed to call 911 with any severe reactions post vaccine: [name] Difficulty breathing  . Swelling of face and throat  . A fast heartbeat  . A bad rash all over body  . Dizziness and weakness   Immunizations Administered    Name Date Dose VIS Date Route   PFIZER Comrnaty(Gray TOP) Covid-19 Vaccine 11/03/2020  2:28 PM 0.3 mL 06/12/2020 Intramuscular   Manufacturer: [place]   Lot: [id number]   NDC: [phone]

## 2020-11-04 ENCOUNTER — Other Ambulatory Visit (HOSPITAL_BASED_OUTPATIENT_CLINIC_OR_DEPARTMENT_OTHER): Payer: Self-pay

## 2020-11-11 ENCOUNTER — Telehealth: Payer: Self-pay | Admitting: Pharmacist

## 2020-11-11 NOTE — Chronic Care Management (AMB) (Signed)
Chronic Care Management Pharmacy Assistant   Name: TRAYTON SZABO  MRN: [id number] DOB: Feb 28, 1951  Reason for Encounter: Disease State/ Hypertension Assessment Call.   Conditions to be addressed/monitored: HTN  Recent office visits:  None.  Recent consult visits:  None.  Hospital visits:  None in previous 6 months  Medications: Outpatient Encounter Medications as of 11/11/2020  Medication Sig Note  . acetaminophen (TYLENOL) 500 MG tablet Take 1,000 mg by mouth as needed.    [name] albuterol (PROAIR HFA) 108 (90 Base) MCG/ACT inhaler INHALE 2 PUFFS EVERY 4  HOURS AS NEEDED FOR  WHEEZING OR SHORTNESS OF  BREATH (Patient taking differently: INHALE 2 PUFFS EVERY 4  HOURS AS NEEDED FOR  WHEEZING OR SHORTNESS OF  BREATH)   . aspirin EC 81 MG tablet Take 162 mg by mouth daily.   . bisacodyl (DULCOLAX) 5 MG EC tablet Take 5 mg by mouth at bedtime.   [name] buPROPion (WELLBUTRIN XL) 300 MG 24 hr tablet TAKE 1 TABLET BY MOUTH  DAILY   . clonazePAM (KLONOPIN) 1 MG tablet TAKE 1 TABLET BY MOUTH  TWICE DAILY AS NEEDED FOR  ANXIETY   . COVID-19 mRNA Vac-TriS, Pfizer, (PFIZER-BIONT COVID-19 VAC-TRIS) SUSP injection Inject into the muscle.   [name] COVID-19 mRNA vaccine, Pfizer, 30 MCG/0.3ML injection INJECT AS DIRECTED   . desvenlafaxine (PRISTIQ) 50 MG 24 hr tablet TAKE 1 TABLET BY MOUTH  DAILY (Patient taking differently: daily.) 09/04/2020: 09/04/2020 Taking 25mg  daily. 1/2 tablet.  . diltiazem (CARDIZEM CD) 120 MG 24 hr capsule TAKE 1 CAPSULE(120 MG) BY MOUTH DAILY   . diltiazem 2 % GEL Apply twice daily to rectum- may mix with OTC recticare (Patient not taking: Reported on 09/04/2020)   . docusate sodium (COLACE) 100 MG capsule Take 100 mg by mouth daily.   . finasteride (PROSCAR) 5 MG tablet Take 5 mg by mouth every morning.    . Fluticasone-Salmeterol (ADVAIR DISKUS) 250-50 MCG/DOSE AEPB Inhale 1 puff into the lungs in the morning and at bedtime. (Patient not taking: No sig reported)   . furosemide  (LASIX) 20 MG tablet Take 1 tablet (20 mg total) by mouth daily. (Patient not taking: Reported on 09/04/2020)   . GAMMA AMINOBUTYRIC ACID PO Take 750 mg by mouth daily. GABA   . hydroxypropyl methylcellulose / hypromellose (ISOPTO TEARS / GONIOVISC) 2.5 % ophthalmic solution Place 1-2 drops into both eyes 3 (three) times daily as needed for dry eyes ((SCHEDULED EACH MORNING)).   . Melatonin 3 MG TBDP Take 3 mg by mouth at bedtime.   . psyllium (METAMUCIL) 58.6 % packet Take 1 packet by mouth daily.   . tadalafil (CIALIS) 5 MG tablet Take 1 tablet (5 mg total) by mouth daily as needed for erectile dysfunction.   . tamsulosin (FLOMAX) 0.4 MG CAPS Take 0.4 mg by mouth at bedtime.    . traMADol (ULTRAM) 50 MG tablet Take 50 mg by mouth every 6 (six) hours as needed. (Patient not taking: Reported on 09/04/2020)   . vitamin B-12 (CYANOCOBALAMIN) 250 MCG tablet Take 250 mcg by mouth daily. (Patient not taking: No sig reported)   . XARELTO 20 MG TABS tablet TAKE ONE-HALF TABLET BY  MOUTH DAILY    No facility-administered encounter medications on file as of 11/11/2020.    Reviewed chart prior to disease state call. Spoke with patient regarding BP  Recent Office Vitals: BP Readings from Last 3 Encounters:  09/04/20 (!) 148/80  06/04/20 (!) 142/80  04/07/20 138/77  Pulse Readings from Last 3 Encounters:  09/04/20 72  06/04/20 (!) 52  04/07/20 62    Wt Readings from Last 3 Encounters:  09/04/20 253 lb 12.8 oz (115.1 kg)  06/26/20 254 lb (115.2 kg)  06/04/20 254 lb 12.8 oz (115.6 kg)     Kidney Function Lab Results  Component Value Date/Time   CREATININE 1.80 (H) 09/04/2020 08:52 AM   CREATININE 1.56 (H) 06/04/2020 09:50 AM   CREATININE 1.62 (H) 03/07/2020 08:52 AM   CREATININE 2.1 (H) 06/29/2017 02:52 PM   GFR 43.84 (L) 05/30/2019 09:03 AM   GFRNONAA 40 (L) 09/04/2020 08:52 AM   GFRNONAA 45 (L) 06/04/2020 09:50 AM   GFRAA 52 (L) 06/04/2020 09:50 AM    BMP Latest Ref Rng & Units 09/04/2020  06/04/2020 03/07/2020  Glucose 70 - 99 mg/dL 119(H) 84 101(H)  BUN 8 - 23 mg/dL 27(H) 16 20  Creatinine 0.61 - 1.24 mg/dL 1.80(H) 1.56(H) 1.62(H)  BUN/Creat Ratio 6 - 22 (calc) - 10 -  Sodium 135 - 145 mmol/L 142 140 142  Potassium 3.5 - 5.1 mmol/L 4.4 4.3 4.1  Chloride 98 - 111 mmol/L 110 108 112(H)  CO2 22 - 32 mmol/L 26 27 23   Calcium 8.9 - 10.3 mg/dL 9.7 9.3 9.1    . Current antihypertensive regimen:  o Diltiazem 120mg  - take 1 capsule daily.  . How often are you checking your Blood Pressure? daily  . Current home BP readings: 153/83 on 11/15/20 and 146/81 today while on phone.   . What recent interventions/DTPs have been made by any provider to improve Blood Pressure control since last CPP Visit: None.   . Any recent hospitalizations or ED visits since last visit with CPP? No  . What diet changes have been made to improve Blood Pressure Control?  o Patient is on a primarily fruits, nuts and berries diet due to his stage 3 kidney disease. Patient does not add salt to anything but popcorn. Eats low amount of protein. Patient drinks lots of water. Patient only eats one serving of chicken a day.   . What exercise is being done to improve your Blood Pressure Control?  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 He states that this job includes walking, lifting, pulling and sometimes running. Patient sates he has also been going to the gym twice a week and using the elliptical.   Adherence Review: Is the patient currently on ACE/ARB medication? No Does the patient have >5 day gap between last estimated fill dates? No  Notes:   Went over patients medication list with him and he reports no issues from current medications. He states he is not using the Advair inhaler and does not take the vitamin B-12. Patient thanked me for my call.   Star Rating Drugs:  None.   [place] [phone]

## 2020-11-13 NOTE — Telephone Encounter (Cosign Needed)
2 attempts.

## 2020-11-14 NOTE — Telephone Encounter (Cosign Needed)
3 attempts

## 2020-12-02 ENCOUNTER — Other Ambulatory Visit: Payer: Self-pay

## 2020-12-03 ENCOUNTER — Ambulatory Visit (INDEPENDENT_AMBULATORY_CARE_PROVIDER_SITE_OTHER): Payer: Medicare Other | Admitting: Family Medicine

## 2020-12-03 ENCOUNTER — Encounter: Payer: Self-pay | Admitting: Family Medicine

## 2020-12-03 VITALS — BP 138/80 | HR 69 | Temp 98.2°F | Wt 248.0 lb

## 2020-12-03 DIAGNOSIS — H6123 Impacted cerumen, bilateral: Secondary | ICD-10-CM | POA: Diagnosis not present

## 2020-12-03 NOTE — Progress Notes (Signed)
   Subjective:    Patient ID: Joseph Tran, male    DOB: 18-Apr-1951, 70 y.o.   MRN: [id number]  HPI Here for 2 weeks of decreased hearing in both ears and also some mild pain in the left ear. No sinus congestion or fever.   Review of Systems  Constitutional: Negative.   HENT: Positive for ear pain and hearing loss. Negative for congestion, postnasal drip, sinus pain and sore throat.   Eyes: Negative.   Respiratory: Negative.        Objective:   Physical Exam Constitutional:      Appearance: Normal appearance. He is not ill-appearing.  HENT:     Right Ear: There is impacted cerumen.     Left Ear: There is impacted cerumen.     Nose: Nose normal.     Mouth/Throat:     Pharynx: Oropharynx is clear.  Eyes:     Conjunctiva/sclera: Conjunctivae normal.  Pulmonary:     Effort: Pulmonary effort is normal.     Breath sounds: Normal breath sounds.  Lymphadenopathy:     Cervical: No cervical adenopathy.  Neurological:     Mental Status: He is alert.           Assessment & Plan:  Cerumen impactions. We obtained informed consent to irrigate the canals with water. This was done successfully. He tolerated the procedure well and afterwards his hearing was back to normal. Recheck as needed.  [name], MD

## 2020-12-09 ENCOUNTER — Inpatient Hospital Stay (HOSPITAL_BASED_OUTPATIENT_CLINIC_OR_DEPARTMENT_OTHER): Payer: Medicare Other | Admitting: Hematology & Oncology

## 2020-12-09 ENCOUNTER — Inpatient Hospital Stay: Payer: Medicare Other | Attending: Hematology & Oncology

## 2020-12-09 ENCOUNTER — Other Ambulatory Visit: Payer: Self-pay

## 2020-12-09 ENCOUNTER — Telehealth: Payer: Self-pay

## 2020-12-09 VITALS — BP 133/76 | HR 67 | Temp 98.6°F | Resp 19 | Ht 72.0 in | Wt 251.0 lb

## 2020-12-09 DIAGNOSIS — I82401 Acute embolism and thrombosis of unspecified deep veins of right lower extremity: Secondary | ICD-10-CM

## 2020-12-09 DIAGNOSIS — N289 Disorder of kidney and ureter, unspecified: Secondary | ICD-10-CM | POA: Insufficient documentation

## 2020-12-09 DIAGNOSIS — R21 Rash and other nonspecific skin eruption: Secondary | ICD-10-CM | POA: Diagnosis not present

## 2020-12-09 DIAGNOSIS — Z7901 Long term (current) use of anticoagulants: Secondary | ICD-10-CM | POA: Diagnosis not present

## 2020-12-09 DIAGNOSIS — Z79899 Other long term (current) drug therapy: Secondary | ICD-10-CM | POA: Diagnosis not present

## 2020-12-09 DIAGNOSIS — M7989 Other specified soft tissue disorders: Secondary | ICD-10-CM | POA: Diagnosis not present

## 2020-12-09 DIAGNOSIS — I82531 Chronic embolism and thrombosis of right popliteal vein: Secondary | ICD-10-CM | POA: Insufficient documentation

## 2020-12-09 DIAGNOSIS — I8291 Chronic embolism and thrombosis of unspecified vein: Secondary | ICD-10-CM | POA: Insufficient documentation

## 2020-12-09 DIAGNOSIS — N183 Chronic kidney disease, stage 3 unspecified: Secondary | ICD-10-CM

## 2020-12-09 DIAGNOSIS — Z7982 Long term (current) use of aspirin: Secondary | ICD-10-CM | POA: Insufficient documentation

## 2020-12-09 DIAGNOSIS — I82511 Chronic embolism and thrombosis of right femoral vein: Secondary | ICD-10-CM | POA: Insufficient documentation

## 2020-12-09 LAB — CMP (CANCER CENTER ONLY)
ALT: 15 U/L (ref 0–44)
AST: 15 U/L (ref 15–41)
Albumin: 3.9 g/dL (ref 3.5–5.0)
Alkaline Phosphatase: 69 U/L (ref 38–126)
Anion gap: 9 (ref 5–15)
BUN: 19 mg/dL (ref 8–23)
CO2: 22 mmol/L (ref 22–32)
Calcium: 9 mg/dL (ref 8.9–10.3)
Chloride: 110 mmol/L (ref 98–111)
Creatinine: 1.67 mg/dL — ABNORMAL HIGH (ref 0.61–1.24)
GFR, Estimated: 44 mL/min — ABNORMAL LOW (ref >60)
Glucose, Bld: 118 mg/dL — ABNORMAL HIGH (ref 70–99)
Potassium: 3.5 mmol/L (ref 3.5–5.1)
Sodium: 141 mmol/L (ref 135–145)
Total Bilirubin: 0.6 mg/dL (ref 0.3–1.2)
Total Protein: 6 g/dL — ABNORMAL LOW (ref 6.5–8.1)

## 2020-12-09 LAB — CBC WITH DIFFERENTIAL (CANCER CENTER ONLY)
Abs Immature Granulocytes: 0.04 10*3/uL (ref 0.00–0.07)
Basophils Absolute: 0.1 10*3/uL (ref 0.0–0.1)
Basophils Relative: 1 %
Eosinophils Absolute: 0.3 10*3/uL (ref 0.0–0.5)
Eosinophils Relative: 5 %
HCT: 42.5 % (ref 39.0–52.0)
Hemoglobin: 14.6 g/dL (ref 13.0–17.0)
Immature Granulocytes: 1 %
Lymphocytes Relative: 20 %
Lymphs Abs: 1.2 10*3/uL (ref 0.7–4.0)
MCH: 31.5 pg (ref 26.0–34.0)
MCHC: 34.4 g/dL (ref 30.0–36.0)
MCV: 91.8 fL (ref 80.0–100.0)
Monocytes Absolute: 0.3 10*3/uL (ref 0.1–1.0)
Monocytes Relative: 5 %
Neutro Abs: 4 10*3/uL (ref 1.7–7.7)
Neutrophils Relative %: 68 %
Platelet Count: 163 10*3/uL (ref 150–400)
RBC: 4.63 MIL/uL (ref 4.22–5.81)
RDW: 12.3 % (ref 11.5–15.5)
WBC Count: 5.8 10*3/uL (ref 4.0–10.5)
nRBC: 0 % (ref 0.0–0.2)

## 2020-12-09 NOTE — Progress Notes (Signed)
Hematology and Oncology Follow Up Visit  Joseph Tran [id number] 10/25/1950 70 y.o. 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 is here today for follow-up.  He is doing quite well.  He was on 3 months ago.  His blood pressure is doing much better.  He seen his family doctor for all this.  He is still busy with his horses.  He has a horse show that he is going to try to go to I think in the next month.  He has had little bit of swelling in the right leg.  There has been no pain.  He said he took a little bit of Lasix and this seemed to help.  He has a rash on his right forearm.  He says for about 10 days.  He says it is getting little bit better.  He is put calamine lotion on it.  He has had no fever.  There is been no cough or shortness of breath.  He has had no chest wall pain.  There is been no issues with nausea or vomiting.  He has had no change in bowel or bladder habits.  Overall, his performance status is ECOG 1.    Medications:  Allergies as of 12/09/2020      Reactions   Allopurinol Other (See Comments)   PATIENT PREFERENCE Pt refused due to mom developing steven johnson syndrome.   Penicillins Hives   Has patient had a PCN reaction causing immediate rash, facial/tongue/throat swelling, SOB or lightheadedness with hypotension: Unknown Has patient had a PCN reaction causing severe rash involving mucus membranes or skin necrosis:Unknown Has patient had a PCN reaction that required hospitalization: No Has patient had a PCN reaction occurring within the last 10 years: No If all of the above answers are "NO", then may proceed with Cephalosporin use.   Sulfamethoxazole Hives   Sulfonamide Derivatives Hives   Dilaudid [hydromorphone Hcl] Nausea And Vomiting      Medication List        Accurate as of December 09, 2020  9:37 AM. If you have any questions, ask your nurse or doctor.        acetaminophen 500 MG tablet Commonly known as: TYLENOL Take 1,000 mg by mouth as needed.   albuterol 108 (90 Base) MCG/ACT inhaler Commonly known as: ProAir HFA INHALE 2 PUFFS EVERY 4  HOURS AS NEEDED FOR  WHEEZING OR SHORTNESS OF  BREATH   aspirin EC 81 MG tablet Take 162 mg by mouth daily.   bisacodyl 5 MG EC tablet Commonly known as: DULCOLAX Take 5 mg by mouth at bedtime.   buPROPion 300 MG 24 hr tablet Commonly known as: WELLBUTRIN XL TAKE 1 TABLET BY MOUTH  DAILY   clonazePAM 1 MG tablet Commonly known as: KLONOPIN TAKE 1 TABLET BY MOUTH  TWICE DAILY AS NEEDED FOR  ANXIETY   desvenlafaxine 50 MG 24 hr tablet Commonly known as: PRISTIQ TAKE 1 TABLET BY MOUTH  DAILY What changed:   how much to take  how to take this   diltiazem 120 MG 24 hr capsule Commonly known as: CARDIZEM CD TAKE 1 CAPSULE(120 MG) BY MOUTH DAILY What changed: See the new instructions.   diltiazem 2 % Gel Apply 1 application topically 2 (two) times daily. prn   docusate sodium 100 MG capsule Commonly known  as: COLACE Take 100 mg by mouth daily.   finasteride 5 MG tablet Commonly known as: PROSCAR Take 5 mg by mouth every morning.   Fluticasone-Salmeterol 250-50 MCG/DOSE Aepb Commonly known as: Advair Diskus Inhale 1 puff into the lungs in the morning and at bedtime.   furosemide 20 MG tablet Commonly known as: LASIX Take 1 tablet (20 mg total) by mouth daily. What changed: additional instructions   GAMMA AMINOBUTYRIC ACID PO Take 750 mg by mouth daily. GABA   hydroxypropyl methylcellulose / hypromellose 2.5 % ophthalmic solution Commonly known as: ISOPTO TEARS / GONIOVISC Place 1-2 drops into both eyes 3 (three) times daily as needed for dry eyes ((SCHEDULED EACH MORNING)).   Melatonin 3 MG Tbdp Take 3 mg by mouth at bedtime.   Pfizer-BioNTech COVID-19 Vacc 30 MCG/0.3ML  injection Generic drug: COVID-19 mRNA vaccine (Pfizer) INJECT AS DIRECTED   Pfizer-BioNT COVID-19 Vac-TriS Susp injection Generic drug: COVID-19 mRNA Vac-TriS (Pfizer) Inject into the muscle.   psyllium 58.6 % packet Commonly known as: METAMUCIL Take 1 packet by mouth daily.   tadalafil 5 MG tablet Commonly known as: Cialis Take 1 tablet (5 mg total) by mouth daily as needed for erectile dysfunction.   tamsulosin 0.4 MG Caps capsule Commonly known as: FLOMAX Take 0.4 mg by mouth at bedtime.   traMADol 50 MG tablet Commonly known as: ULTRAM Take 50 mg by mouth every 6 (six) hours as needed.   Xarelto 20 MG Tabs tablet Generic drug: rivaroxaban TAKE ONE-HALF TABLET BY  MOUTH DAILY       Allergies:  Allergies  Allergen Reactions  . Allopurinol Other (See Comments)    PATIENT PREFERENCE Pt refused due to mom developing steven johnson syndrome.  Joseph Tran Penicillins Hives    Has patient had a PCN reaction causing immediate rash, facial/tongue/throat swelling, SOB or lightheadedness with hypotension: Unknown Has patient had a PCN reaction causing severe rash involving mucus membranes or skin necrosis:Unknown Has patient had a PCN reaction that required hospitalization: No Has patient had a PCN reaction occurring within the last 10 years: No If all of the above answers are "NO", then may proceed with Cephalosporin use.   . Sulfamethoxazole Hives  . Sulfonamide Derivatives Hives  . Dilaudid [Hydromorphone Hcl] Nausea And Vomiting    Past Medical History, Surgical history, Social history, and Family History were reviewed and updated.  Review of Systems: Review of Systems  Constitutional: Negative.   HENT: Negative.   Eyes: Negative.   Respiratory: Negative.   Cardiovascular: Negative.   Gastrointestinal: Negative.   Genitourinary: Negative.   Musculoskeletal: Negative.   Skin: Negative.   Neurological: Negative.   Endo/Heme/Allergies: Negative.    Psychiatric/Behavioral: Negative.      Physical Exam:  vitals were not taken for this visit.   Wt Readings from Last 3 Encounters:  12/03/20 112.5 kg  09/04/20 115.1 kg  06/26/20 115.2 kg    Physical Exam Vitals reviewed.  HENT:     Head: Normocephalic and atraumatic.  Eyes:     Pupils: Pupils are equal, round, and reactive to light.  Cardiovascular:     Rate and Rhythm: Normal rate and regular rhythm.     Heart sounds: Normal heart sounds.  Pulmonary:     Effort: Pulmonary effort is normal.     Breath sounds: Normal breath sounds.  Abdominal:     General: Bowel sounds are normal.     Palpations: Abdomen is soft.  Musculoskeletal:        General:  No tenderness or deformity. Normal range of motion.     Cervical back: Normal range of motion.  Lymphadenopathy:     Cervical: No cervical adenopathy.  Skin:    General: Skin is warm and dry.     Findings: No erythema or rash.  Neurological:     Mental Status: He is alert and oriented to person, place, and time.  Psychiatric:        Behavior: Behavior normal.        Thought Content: Thought content normal.        Judgment: Judgment normal.      Lab Results  Component Value Date   WBC 5.8 12/09/2020   HGB 14.6 12/09/2020   HCT 42.5 12/09/2020   MCV 91.8 12/09/2020   PLT 163 12/09/2020   Lab Results  Component Value Date   FERRITIN 116 03/31/2017   IRON 101 03/31/2017   TIBC 286 03/31/2017   UIBC 185 03/31/2017   IRONPCTSAT 35 03/31/2017   Lab Results  Component Value Date   RBC 4.63 12/09/2020   No results found for: KPAFRELGTCHN, LAMBDASER, KAPLAMBRATIO No results found for: Kandis Cocking, IGMSERUM No results found for: Odetta Pink, SPEI   Chemistry      Component Value Date/Time   NA 141 12/09/2020 0859   NA 145 06/29/2017 1452   K 3.5 12/09/2020 0859   K 4.7 06/29/2017 1452   CL 110 12/09/2020 0859   CL 107 06/29/2017 1452   CO2 22  12/09/2020 0859   CO2 25 06/29/2017 1452   BUN 19 12/09/2020 0859   BUN 33 (H) 06/29/2017 1452   CREATININE 1.67 (H) 12/09/2020 0859   CREATININE 1.56 (H) 06/04/2020 0950      Component Value Date/Time   CALCIUM 9.0 12/09/2020 0859   CALCIUM 9.5 06/29/2017 1452   ALKPHOS 69 12/09/2020 0859   ALKPHOS 83 06/29/2017 1452   AST 15 12/09/2020 0859   ALT 15 12/09/2020 0859   ALT 23 06/29/2017 1452   BILITOT 0.6 12/09/2020 0859       Impression and Plan: Mr. 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  He is on Xarelto.  Also on baby aspirin.  I think the 2 together is certainly a good combination for him.  Again his issues are certainly not oncologic or hematologic.  Since we have seen him today, we will help him out with the blood pressure.  I do not see that we have to do any scans on him.  I do not see that we have to do any Dopplers of the legs.  We will get him back in 4 months just to make sure we follow-up with the issues that are ongoing.   Volanda Napoleon, MD 6/7/20229:37 AM

## 2020-12-09 NOTE — Telephone Encounter (Signed)
appts made per 12/09/20 los and pt to gain sch at checkout and through [employer]

## 2020-12-10 ENCOUNTER — Other Ambulatory Visit: Payer: Self-pay | Admitting: Hematology & Oncology

## 2020-12-29 ENCOUNTER — Telehealth: Payer: Self-pay | Admitting: Pharmacist

## 2020-12-29 NOTE — Chronic Care Management (AMB) (Signed)
Chronic Care Management Pharmacy Assistant   Name: Joseph Tran  MRN: [id number] DOB: 07/02/1951  Reason for Encounter: Disease State/ Hypertension Assessment Call.    Conditions to be addressed/monitored: HTN  Recent office visits:  12/03/20 [name] MD [facility] Medicine) -  seen for bilateral impacted cerumen. Changed diltiazem from twice daily mixed with recticare to just twice daily as needed. Discontinued cyanocobalamin 260mcg daily. Recheck as needed.   Recent consult visits:  12/09/20 [name] MD ( Oncology) - presented to clinic for follow up for stage 3 chronic kidney disease. Discontinued diltiazem HCL 120mg . Follow up in 4 months.   Hospital visits:  None in previous 6 months  Medications: Outpatient Encounter Medications as of 12/29/2020  Medication Sig Note   acetaminophen (TYLENOL) 500 MG tablet Take 1,000 mg by mouth as needed.     albuterol (PROAIR HFA) 108 (90 Base) MCG/ACT inhaler INHALE 2 PUFFS EVERY 4  HOURS AS NEEDED FOR  WHEEZING OR SHORTNESS OF  BREATH (Patient taking differently: INHALE 2 PUFFS EVERY 4  HOURS AS NEEDED FOR  WHEEZING OR SHORTNESS OF  BREATH)    aspirin EC 81 MG tablet Take 162 mg by mouth daily.    bisacodyl (DULCOLAX) 5 MG EC tablet Take 5 mg by mouth at bedtime.    buPROPion (WELLBUTRIN XL) 300 MG 24 hr tablet TAKE 1 TABLET BY MOUTH  DAILY    clindamycin (CLEOCIN) 300 MG capsule TAKE 2 CAPSULES BY MOUTH 1 HOUR BEFORE DENTAL APPOINTMENT    clonazePAM (KLONOPIN) 1 MG tablet TAKE 1 TABLET BY MOUTH  TWICE DAILY AS NEEDED FOR  ANXIETY    COVID-19 mRNA Vac-TriS, Pfizer, (PFIZER-BIONT COVID-19 VAC-TRIS) SUSP injection Inject into the muscle.    COVID-19 mRNA vaccine, Pfizer, 30 MCG/0.3ML injection INJECT AS DIRECTED    desvenlafaxine (PRISTIQ) 50 MG 24 hr tablet TAKE 1 TABLET BY MOUTH  DAILY (Patient taking differently: daily.) 09/04/2020: 09/04/2020 Taking 25mg  daily. 1/2 tablet.   diltiazem (TIAZAC) 240 MG 24 hr capsule Take 240 mg by mouth  daily.    diltiazem 2 % GEL Apply 1 application topically 2 (two) times daily. prn    docusate sodium (COLACE) 100 MG capsule Take 100 mg by mouth daily.    finasteride (PROSCAR) 5 MG tablet Take 5 mg by mouth every morning.     Fluticasone-Salmeterol (ADVAIR DISKUS) 250-50 MCG/DOSE AEPB Inhale 1 puff into the lungs in the morning and at bedtime.    furosemide (LASIX) 20 MG tablet Take 1 tablet (20 mg total) by mouth daily. (Patient taking differently: Take 20 mg by mouth daily. As needed)    GAMMA AMINOBUTYRIC ACID PO Take 750 mg by mouth daily. GABA    hydroxypropyl methylcellulose / hypromellose (ISOPTO TEARS / GONIOVISC) 2.5 % ophthalmic solution Place 1-2 drops into both eyes 3 (three) times daily as needed for dry eyes ((SCHEDULED EACH MORNING)).    Melatonin 3 MG TBDP Take 3 mg by mouth at bedtime.    psyllium (METAMUCIL) 58.6 % packet Take 1 packet by mouth daily.    SHINGRIX injection     tadalafil (CIALIS) 5 MG tablet Take 1 tablet (5 mg total) by mouth daily as needed for erectile dysfunction.    tamsulosin (FLOMAX) 0.4 MG CAPS Take 0.4 mg by mouth at bedtime.     traMADol (ULTRAM) 50 MG tablet Take 50 mg by mouth every 6 (six) hours as needed.    XARELTO 20 MG TABS tablet TAKE ONE-HALF TABLET BY  MOUTH DAILY    No facility-administered encounter medications on file as of 12/29/2020.    Reviewed chart prior to disease state call. Spoke with patient regarding BP  Recent Office Vitals: BP Readings from Last 3 Encounters:  12/09/20 133/76  12/03/20 138/80  09/04/20 (!) 148/80   Pulse Readings from Last 3 Encounters:  12/09/20 67  12/03/20 69  09/04/20 72    Wt Readings from Last 3 Encounters:  12/09/20 251 lb (113.9 kg)  12/03/20 248 lb (112.5 kg)  09/04/20 253 lb 12.8 oz (115.1 kg)     Kidney Function Lab Results  Component Value Date/Time   CREATININE 1.67 (H) 12/09/2020 08:59 AM   CREATININE 1.80 (H) 09/04/2020 08:52 AM   CREATININE 1.56 (H) 06/04/2020 09:50 AM    CREATININE 2.1 (H) 06/29/2017 02:52 PM   GFR 43.84 (L) 05/30/2019 09:03 AM   GFRNONAA 44 (L) 12/09/2020 08:59 AM   GFRNONAA 45 (L) 06/04/2020 09:50 AM   GFRAA 52 (L) 06/04/2020 09:50 AM    BMP Latest Ref Rng & Units 12/09/2020 09/04/2020 06/04/2020  Glucose 70 - 99 mg/dL 118(H) 119(H) 84  BUN 8 - 23 mg/dL 19 27(H) 16  Creatinine 0.61 - 1.24 mg/dL 1.67(H) 1.80(H) 1.56(H)  BUN/Creat Ratio 6 - 22 (calc) - - 10  Sodium 135 - 145 mmol/L 141 142 140  Potassium 3.5 - 5.1 mmol/L 3.5 4.4 4.3  Chloride 98 - 111 mmol/L 110 110 108  CO2 22 - 32 mmol/L 22 26 27   Calcium 8.9 - 10.3 mg/dL 9.0 9.7 9.3    Current antihypertensive regimen:  Diltiazem 240mg  - take once daily.  Diltiazem gel 2 percent - apply topically twice daily.  How often are you checking your Blood Pressure?  Current home BP readings:  What recent interventions/DTPs have been made by any provider to improve Blood Pressure control since last CPP Visit: None.  Any recent hospitalizations or ED visits since last visit with CPP? No What diet changes have been made to improve Blood Pressure Control?   What exercise is being done to improve your Blood Pressure Control?    Adherence Review: Is the patient currently on ACE/ARB medication? No Does the patient have >5 day gap between last estimated fill dates? No  Multiple unsuccessful attempts to reach patient by phone.  Star Rating Drugs:  None  [place]raham  Clinical Pharmacist Assistant [phone]

## 2020-12-31 ENCOUNTER — Telehealth: Payer: Self-pay | Admitting: Family Medicine

## 2020-12-31 NOTE — Telephone Encounter (Cosign Needed)
2nd attempt

## 2020-12-31 NOTE — Telephone Encounter (Signed)
Left message for patient to call back and schedule Medicare Annual Wellness Visit (AWV) either virtually or in office.   AWV-I per PALMETTO 06/04/17 please schedule at anytime with [facility] Nurse Health Advisor 1 or 2   This should be a 45 minute visit.

## 2021-01-01 NOTE — Telephone Encounter (Cosign Needed)
3rd attempt

## 2021-01-11 DIAGNOSIS — Z20822 Contact with and (suspected) exposure to covid-19: Secondary | ICD-10-CM | POA: Diagnosis not present

## 2021-01-28 ENCOUNTER — Other Ambulatory Visit: Payer: Self-pay

## 2021-01-29 ENCOUNTER — Ambulatory Visit (INDEPENDENT_AMBULATORY_CARE_PROVIDER_SITE_OTHER): Payer: Medicare Other | Admitting: Family Medicine

## 2021-01-29 ENCOUNTER — Encounter: Payer: Self-pay | Admitting: Family Medicine

## 2021-01-29 VITALS — BP 122/70 | HR 75 | Temp 99.2°F | Ht 73.0 in | Wt 248.4 lb

## 2021-01-29 DIAGNOSIS — E538 Deficiency of other specified B group vitamins: Secondary | ICD-10-CM | POA: Diagnosis not present

## 2021-01-29 DIAGNOSIS — G629 Polyneuropathy, unspecified: Secondary | ICD-10-CM | POA: Diagnosis not present

## 2021-01-29 DIAGNOSIS — E039 Hypothyroidism, unspecified: Secondary | ICD-10-CM | POA: Diagnosis not present

## 2021-01-29 DIAGNOSIS — R739 Hyperglycemia, unspecified: Secondary | ICD-10-CM

## 2021-01-29 DIAGNOSIS — M6208 Separation of muscle (nontraumatic), other site: Secondary | ICD-10-CM

## 2021-01-29 LAB — HEMOGLOBIN A1C: Hgb A1c MFr Bld: 5.2 % (ref 4.6–6.5)

## 2021-01-29 LAB — T4, FREE: Free T4: 0.69 ng/dL (ref 0.60–1.60)

## 2021-01-29 LAB — VITAMIN B12: Vitamin B-12: 162 pg/mL — ABNORMAL LOW (ref 211–911)

## 2021-01-29 LAB — T3, FREE: T3, Free: 3.3 pg/mL (ref 2.3–4.2)

## 2021-01-29 LAB — TSH: TSH: 1.4 u[IU]/mL (ref 0.35–5.50)

## 2021-01-29 NOTE — Progress Notes (Signed)
   Subjective:    Patient ID: Joseph Tran, male    DOB: 1951/05/09, 70 y.o.   MRN: [id number]  HPI Here for several issues. First over a year ago he began to notice numbness in his toes, and this has become more pronounced in the past few months. This involves all the toes. The hands are not affected. There is no pain or tingling, just a loss of sensation. No swelling or color changes. Also he has had a lump come up in the center of his abdomen when he starts to sit up from a lying position for several years. Someone recently told him this was a "hernia". It does not bother him in any way.    Review of Systems  Constitutional: Negative.   Respiratory: Negative.    Cardiovascular: Negative.   Gastrointestinal:  Negative for abdominal pain, blood in stool, constipation and diarrhea.      Objective:   Physical Exam Constitutional:      Appearance: Normal appearance. He is obese.  Cardiovascular:     Rate and Rhythm: Normal rate and regular rhythm.     Pulses: Normal pulses.     Heart sounds: Normal heart sounds.  Pulmonary:     Effort: Pulmonary effort is normal.     Breath sounds: Normal breath sounds.  Abdominal:     General: Bowel sounds are normal. There is no distension.     Palpations: Abdomen is soft.     Tenderness: There is no abdominal tenderness. There is no guarding or rebound.     Hernia: No hernia is present.     Comments: There is a long midline vertical swelling when he sits up partially. This is not tender   Skin:    Comments: Both feet appear normal   Neurological:     Mental Status: He is alert.          Assessment & Plan:  He has a neuropathy of some sort, and we will investigate this by checking a thyroid panel, an A1c, and a B12 level. His recent lasb at the [facility] (CBC and CMET) were normal. He also has a diastasis recti, and I explained that this is benign and no treatment is needed. We spent 35 minutes reviewing records and discussing these  issues.  [name], MD

## 2021-01-30 ENCOUNTER — Other Ambulatory Visit: Payer: Self-pay

## 2021-01-30 ENCOUNTER — Ambulatory Visit (INDEPENDENT_AMBULATORY_CARE_PROVIDER_SITE_OTHER): Payer: Medicare Other | Admitting: Pulmonary Disease

## 2021-01-30 ENCOUNTER — Encounter: Payer: Self-pay | Admitting: Pulmonary Disease

## 2021-01-30 VITALS — BP 132/78 | HR 74 | Temp 98.2°F | Ht 72.0 in | Wt 250.8 lb

## 2021-01-30 DIAGNOSIS — G4733 Obstructive sleep apnea (adult) (pediatric): Secondary | ICD-10-CM

## 2021-01-30 DIAGNOSIS — Z9989 Dependence on other enabling machines and devices: Secondary | ICD-10-CM | POA: Diagnosis not present

## 2021-01-30 NOTE — Addendum Note (Signed)
Addended by: Vanessa Barbara on: 01/30/2021 04:52 PM   Modules accepted: Orders

## 2021-01-30 NOTE — Progress Notes (Signed)
Subjective:    Patient ID: Joseph Tran, male    DOB: 1951-02-05, 70 y.o.   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 [name] at [facility], left kidney is non=functioning.  right kidny is at 50%   Clotting disorder ([place])    Hx DVT - Xarelto   Colon polyps    Complication of anesthesia    Post op nausea/vomiting   COPD (chronic obstructive pulmonary disease) ([facility])    Depression    severe.  dx 1985   DVT (deep venous thrombosis) ([place])    sees Dr. [name]    GERD (gastroesophageal reflux disease)    History of kidney stones    has kidney stone now.     Hypogonadism male    sees Dr. Heather Roberts at [facility]    Lupus (systemic lupus erythematosus) [facility])    sees Dr. Lorenza Cambridge at [facility]    OSA (obstructive sleep apnea)    Plantar fasciitis    rt foot   PONV (postoperative nausea and vomiting)    Renal atrophy, left    sees Dr. Heather Roberts at [facility]    Sleep apnea    tested 2010  - wears c-pap   Social History   Socioeconomic History   Marital status: Single    Spouse name: Not on file   Number of children: 0   Years of education: Not on file    Highest education level: Not on file  Occupational History   Occupation: retired  Tobacco Use   Smoking status: Never   Smokeless tobacco: Never   Tobacco comments:    NEVER USED TOBACCO  Vaping Use   Vaping Use: Never used  Substance and Sexual Activity   Alcohol use: No    Alcohol/week: 0.0 standard drinks   Drug use: No   Sexual activity: Not on file  Other Topics Concern   Not on file  Social History Narrative   Not on file   Social Determinants of Health   Financial Resource Strain: Low Risk    Difficulty of Paying Living Expenses: Not very hard  Food Insecurity: Not on file  Transportation Needs: No Transportation Needs   Lack of Transportation (Medical): No   Lack of Transportation (Non-Medical): No  Physical Activity: Not on file  Stress: Not on file  Social Connections: Not on file  Intimate Partner Violence: Not on file   Family History  Problem Relation Age of Onset   Heart disease Other  parents   Hyperlipidemia Other    Stroke Other        grandparents   Sudden death Other        uncle less than 84 yrs old   Heart disease Father    Throat cancer Maternal Grandmother    Liver cancer Paternal Grandmother    Colon cancer Neg Hx    Esophageal cancer Neg Hx    Rectal cancer Neg Hx    Stomach cancer Neg Hx     Review of Systems  Constitutional:  Negative for fatigue.  Respiratory:  Positive for apnea.   Psychiatric/Behavioral:  Positive for sleep disturbance.      Objective:   Physical Exam Constitutional:      Appearance: He is obese.  HENT:     Head: Normocephalic and atraumatic.     Mouth/Throat:     Mouth: Mucous membranes are moist.  Cardiovascular:     Rate and Rhythm: Normal rate and regular rhythm.     Heart sounds: No murmur heard.   No friction rub.  Pulmonary:     Effort: No respiratory distress.     Breath sounds: No stridor. No wheezing or rhonchi.  Musculoskeletal:     Cervical back: No rigidity or tenderness.   Neurological:     Mental Status: He is alert.  Psychiatric:        Mood and Affect: Mood normal.   Vitals:   01/30/21 1610  BP: 132/78  Pulse: 74  Temp: 98.2 F (36.8 C)  SpO2: 97%   Results of the Epworth flowsheet 09/25/2019  Sitting and reading 2  Watching TV 2  Sitting, inactive in a public place (e.g. a theatre or a meeting) 1  As a passenger in a car for an hour without a break 1  Lying down to rest in the afternoon when circumstances permit 2  Sitting and talking to someone 0  Sitting quietly after a lunch without alcohol 1  In a car, while stopped for a few minutes in traffic 0  Total score 9   Sleep study results shows severe obstructive sleep apnea    Compliance data reveals excellent compliance 100% compliance Average use of 8 hours 5 minutes Set between 5 and 20 95% of pressure of 9.8 Residual AHI 1.2  Assessment & Plan:  .  Severe obstructive sleep apnea -Compliant with CPAP -Effective  .  History of asthma -Albuterol as needed -Advair  Plan .  Continue CPAP .  Call with significant concerns .  Follow-up a year from now

## 2021-01-30 NOTE — Patient Instructions (Signed)
Obstructive sleep apnea Adequately treated with CPAP therapy  No changes need made at present time  I will follow-up with you in about a year from now  Call with significant concerns  Send order to DME company for CPAP supplies

## 2021-02-03 ENCOUNTER — Telehealth: Payer: Self-pay | Admitting: Family Medicine

## 2021-02-03 NOTE — Telephone Encounter (Signed)
Left message for patient to call back and schedule Medicare Annual Wellness Visit (AWV) either virtually or in office.   AWV-I per PALMETTO 06/04/17  please schedule at anytime with [facility] Nurse Health Advisor 1 or 2   This should be a 45 minute visit.

## 2021-02-04 ENCOUNTER — Ambulatory Visit (INDEPENDENT_AMBULATORY_CARE_PROVIDER_SITE_OTHER): Payer: Medicare Other

## 2021-02-04 ENCOUNTER — Other Ambulatory Visit: Payer: Self-pay

## 2021-02-04 DIAGNOSIS — E538 Deficiency of other specified B group vitamins: Secondary | ICD-10-CM | POA: Diagnosis not present

## 2021-02-04 MED ORDER — CYANOCOBALAMIN 1000 MCG/ML IJ SOLN
1000.0000 ug | Freq: Once | INTRAMUSCULAR | Status: AC
  Administered 2021-02-04: 1000 ug via INTRAMUSCULAR

## 2021-02-04 NOTE — Progress Notes (Signed)
Per orders of [name], MD, injection of B12 given in Right deltoid by Franco Collet. Patient tolerated injection well.  Lab Results  Component Value Date   VITAMINB12 162 (L) 01/29/2021

## 2021-02-04 NOTE — Patient Instructions (Signed)
Health Maintenance Due  Topic Date Due   Hepatitis C Screening  Never done   PNA vac Low Risk Adult (2 of 2 - PPSV23) 04/28/2017   Zoster Vaccines- Shingrix (2 of 2) 10/27/2020   INFLUENZA VACCINE  02/02/2021    Depression screen PHQ 2/9 06/26/2020 06/04/2020  Decreased Interest 0 0  Down, Depressed, Hopeless 0 2  PHQ - 2 Score 0 2  Altered sleeping - 2  Tired, decreased energy - 1  Change in appetite - 0  Feeling bad or failure about yourself  - 1  Trouble concentrating - 0  Moving slowly or fidgety/restless - 0  Suicidal thoughts - 0  PHQ-9 Score - 6  Difficult doing work/chores - Somewhat difficult

## 2021-02-09 DIAGNOSIS — E790 Hyperuricemia without signs of inflammatory arthritis and tophaceous disease: Secondary | ICD-10-CM | POA: Diagnosis not present

## 2021-02-09 DIAGNOSIS — N183 Chronic kidney disease, stage 3 unspecified: Secondary | ICD-10-CM | POA: Diagnosis not present

## 2021-02-09 DIAGNOSIS — E538 Deficiency of other specified B group vitamins: Secondary | ICD-10-CM | POA: Diagnosis not present

## 2021-02-09 DIAGNOSIS — I10 Essential (primary) hypertension: Secondary | ICD-10-CM | POA: Diagnosis not present

## 2021-02-12 ENCOUNTER — Ambulatory Visit (INDEPENDENT_AMBULATORY_CARE_PROVIDER_SITE_OTHER): Payer: Medicare Other

## 2021-02-12 ENCOUNTER — Other Ambulatory Visit: Payer: Self-pay

## 2021-02-12 DIAGNOSIS — E538 Deficiency of other specified B group vitamins: Secondary | ICD-10-CM

## 2021-02-12 MED ORDER — CYANOCOBALAMIN 1000 MCG/ML IJ SOLN
1000.0000 ug | Freq: Once | INTRAMUSCULAR | Status: AC
  Administered 2021-02-12: 1000 ug via INTRAMUSCULAR

## 2021-02-12 NOTE — Progress Notes (Signed)
Per orders of Dr. Fry, injection of Cyanocobalamin 1000 mcg given by Valjean Ruppel L Lakeya Mulka. °Patient tolerated injection well.  °

## 2021-02-19 DIAGNOSIS — M1712 Unilateral primary osteoarthritis, left knee: Secondary | ICD-10-CM | POA: Diagnosis not present

## 2021-02-19 DIAGNOSIS — M25562 Pain in left knee: Secondary | ICD-10-CM | POA: Diagnosis not present

## 2021-02-20 ENCOUNTER — Ambulatory Visit (INDEPENDENT_AMBULATORY_CARE_PROVIDER_SITE_OTHER): Payer: Medicare Other

## 2021-02-20 ENCOUNTER — Other Ambulatory Visit: Payer: Self-pay

## 2021-02-20 VITALS — BP 122/68 | HR 68 | Temp 98.4°F | Ht 72.0 in | Wt 250.0 lb

## 2021-02-20 DIAGNOSIS — E538 Deficiency of other specified B group vitamins: Secondary | ICD-10-CM

## 2021-02-20 DIAGNOSIS — Z0000 Encounter for general adult medical examination without abnormal findings: Secondary | ICD-10-CM

## 2021-02-20 MED ORDER — CYANOCOBALAMIN 1000 MCG/ML IJ SOLN
1000.0000 ug | Freq: Once | INTRAMUSCULAR | Status: AC
  Administered 2021-02-20: 1000 ug via INTRAMUSCULAR

## 2021-02-20 NOTE — Progress Notes (Signed)
Per orders of Cory Nafziger NP, injection of Cyanocobalamin 1000 mcg given by Pearson Reasons L Brennley Curtice. Patient tolerated injection well.  

## 2021-02-20 NOTE — Patient Instructions (Signed)
Joseph Tran , Thank you for taking time to come for your Medicare Wellness Visit. I appreciate your ongoing commitment to your health goals. Please review the following plan we discussed and let me know if I can assist you in the future.   Screening recommendations/referrals: Colonoscopy: 04/07/2020  due 04/2023 Recommended yearly ophthalmology/optometry visit for glaucoma screening and checkup Recommended yearly dental visit for hygiene and checkup  Vaccinations: Influenza vaccine: due in fall 2022  Pneumococcal vaccine: completed series  Tdap vaccine: 08/02/2015 Shingles vaccine: completed series     Advanced directives: copies in chart   Conditions/risks identified: none   Next appointment: 06/09/2021  0800  CPE Dr. Sarajane Jews  Preventive Care 65 Years and Older, Male Preventive care refers to lifestyle choices and visits with your health care provider that can promote health and wellness. What does preventive care include? A yearly physical exam. This is also called an annual well check. Dental exams once or twice a year. Routine eye exams. Ask your health care provider how often you should have your eyes checked. Personal lifestyle choices, including: Daily care of your teeth and gums. Regular physical activity. Eating a healthy diet. Avoiding tobacco and drug use. Limiting alcohol use. Practicing safe sex. Taking low doses of aspirin every day. Taking vitamin and mineral supplements as recommended by your health care provider. What happens during an annual well check? The services and screenings done by your health care provider during your annual well check will depend on your age, overall health, lifestyle risk factors, and family history of disease. Counseling  Your health care provider may ask you questions about your: Alcohol use. Tobacco use. Drug use. Emotional well-being. Home and relationship well-being. Sexual activity. Eating habits. History of falls. Memory and  ability to understand (cognition). Work and work Statistician. Screening  You may have the following tests or measurements: Height, weight, and BMI. Blood pressure. Lipid and cholesterol levels. These may be checked every 5 years, or more frequently if you are over 32 years old. Skin check. Lung cancer screening. You may have this screening every year starting at age 28 if you have a 30-pack-year history of smoking and currently smoke or have quit within the past 15 years. Fecal occult blood test (FOBT) of the stool. You may have this test every year starting at age 18. Flexible sigmoidoscopy or colonoscopy. You may have a sigmoidoscopy every 5 years or a colonoscopy every 10 years starting at age 8. Prostate cancer screening. Recommendations will vary depending on your family history and other risks. Hepatitis C blood test. Hepatitis B blood test. Sexually transmitted disease (STD) testing. Diabetes screening. This is done by checking your blood sugar (glucose) after you have not eaten for a while (fasting). You may have this done every 1-3 years. Abdominal aortic aneurysm (AAA) screening. You may need this if you are a current or former smoker. Osteoporosis. You may be screened starting at age 88 if you are at high risk. Talk with your health care provider about your test results, treatment options, and if necessary, the need for more tests. Vaccines  Your health care provider may recommend certain vaccines, such as: Influenza vaccine. This is recommended every year. Tetanus, diphtheria, and acellular pertussis (Tdap, Td) vaccine. You may need a Td booster every 10 years. Zoster vaccine. You may need this after age 62. Pneumococcal 13-valent conjugate (PCV13) vaccine. One dose is recommended after age 70. Pneumococcal polysaccharide (PPSV23) vaccine. One dose is recommended after age 85. Talk to your  health care provider about which screenings and vaccines you need and how often you need  them. This information is not intended to replace advice given to you by your health care provider. Make sure you discuss any questions you have with your health care provider. Document Released: 07/18/2015 Document Revised: 03/10/2016 Document Reviewed: 04/22/2015 Elsevier Interactive Patient Education  2017 [place] Prevention in the Home Falls can cause injuries. They can happen to people of all ages. There are many things you can do to make your home safe and to help prevent falls. What can I do on the outside of my home? Regularly fix the edges of walkways and driveways and fix any cracks. Remove anything that might make you trip as you walk through a door, such as a raised step or threshold. Trim any bushes or trees on the path to your home. Use bright outdoor lighting. Clear any walking paths of anything that might make someone trip, such as rocks or tools. Regularly check to see if handrails are loose or broken. Make sure that both sides of any steps have handrails. Any raised decks and porches should have guardrails on the edges. Have any leaves, snow, or ice cleared regularly. Use sand or salt on walking paths during winter. Clean up any spills in your garage right away. This includes oil or grease spills. What can I do in the bathroom? Use night lights. Install grab bars by the toilet and in the tub and shower. Do not use towel bars as grab bars. Use non-skid mats or decals in the tub or shower. If you need to sit down in the shower, use a plastic, non-slip stool. Keep the floor dry. Clean up any water that spills on the floor as soon as it happens. Remove soap buildup in the tub or shower regularly. Attach bath mats securely with double-sided non-slip rug tape. Do not have throw rugs and other things on the floor that can make you trip. What can I do in the bedroom? Use night lights. Make sure that you have a light by your bed that is easy to reach. Do not use  any sheets or blankets that are too big for your bed. They should not hang down onto the floor. Have a firm chair that has side arms. You can use this for support while you get dressed. Do not have throw rugs and other things on the floor that can make you trip. What can I do in the kitchen? Clean up any spills right away. Avoid walking on wet floors. Keep items that you use a lot in easy-to-reach places. If you need to reach something above you, use a strong step stool that has a grab bar. Keep electrical cords out of the way. Do not use floor polish or wax that makes floors slippery. If you must use wax, use non-skid floor wax. Do not have throw rugs and other things on the floor that can make you trip. What can I do with my stairs? Do not leave any items on the stairs. Make sure that there are handrails on both sides of the stairs and use them. Fix handrails that are broken or loose. Make sure that handrails are as long as the stairways. Check any carpeting to make sure that it is firmly attached to the stairs. Fix any carpet that is loose or worn. Avoid having throw rugs at the top or bottom of the stairs. If you do have throw rugs, attach them  to the floor with carpet tape. Make sure that you have a light switch at the top of the stairs and the bottom of the stairs. If you do not have them, ask someone to add them for you. What else can I do to help prevent falls? Wear shoes that: Do not have high heels. Have rubber bottoms. Are comfortable and fit you well. Are closed at the toe. Do not wear sandals. If you use a stepladder: Make sure that it is fully opened. Do not climb a closed stepladder. Make sure that both sides of the stepladder are locked into place. Ask someone to hold it for you, if possible. Clearly mark and make sure that you can see: Any grab bars or handrails. First and last steps. Where the edge of each step is. Use tools that help you move around (mobility aids)  if they are needed. These include: Canes. Walkers. Scooters. Crutches. Turn on the lights when you go into a dark area. Replace any light bulbs as soon as they burn out. Set up your furniture so you have a clear path. Avoid moving your furniture around. If any of your floors are uneven, fix them. If there are any pets around you, be aware of where they are. Review your medicines with your doctor. Some medicines can make you feel dizzy. This can increase your chance of falling. Ask your doctor what other things that you can do to help prevent falls. This information is not intended to replace advice given to you by your health care provider. Make sure you discuss any questions you have with your health care provider. Document Released: 04/17/2009 Document Revised: 11/27/2015 Document Reviewed: 07/26/2014 Elsevier Interactive Patient Education  2017 [employer].

## 2021-02-20 NOTE — Progress Notes (Signed)
Subjective:   Joseph Tran is a 70 y.o. male who presents for an Initial Medicare Annual Wellness Visit.  Review of Systems    N/a       Objective:    There were no vitals filed for this visit. There is no height or weight on file to calculate BMI.  Advanced Directives 12/09/2020 09/04/2020 06/26/2020 03/07/2020 11/06/2019 07/09/2019 03/07/2019  Does Patient Have a Medical Advance Directive? Yes Yes Yes Yes Yes Yes Yes  Type of Paramedic of [place];Living will [place];Living will - [place];Living will [place];Living will [place];Living will [place];Living will  Does patient want to make changes to medical advance directive? No - Patient declined - - No - Patient declined No - Patient declined - No - Patient declined  Copy of [place] in Chart? No - copy requested No - copy requested - No - copy requested No - copy requested No - copy requested No - copy requested  Would patient like information on creating a medical advance directive? - - - - - - -    Current Medications (verified) Outpatient Encounter Medications as of 02/20/2021  Medication Sig   acetaminophen (TYLENOL) 500 MG tablet Take 1,000 mg by mouth as needed.    albuterol (PROAIR HFA) 108 (90 Base) MCG/ACT inhaler INHALE 2 PUFFS EVERY 4  HOURS AS NEEDED FOR  WHEEZING OR SHORTNESS OF  BREATH (Patient taking differently: INHALE 2 PUFFS EVERY 4  HOURS AS NEEDED FOR  WHEEZING OR SHORTNESS OF  BREATH)   aspirin EC 81 MG tablet Take 162 mg by mouth daily.   bisacodyl (DULCOLAX) 5 MG EC tablet Take 5 mg by mouth at bedtime.   buPROPion (WELLBUTRIN XL) 300 MG 24 hr tablet TAKE 1 TABLET BY MOUTH  DAILY   clindamycin (CLEOCIN) 300 MG capsule TAKE 2 CAPSULES BY MOUTH 1 HOUR BEFORE DENTAL APPOINTMENT   clonazePAM (KLONOPIN) 1 MG tablet TAKE 1 TABLET BY MOUTH  TWICE DAILY AS NEEDED FOR  ANXIETY    COVID-19 mRNA Vac-TriS, Pfizer, (PFIZER-BIONT COVID-19 VAC-TRIS) SUSP injection Inject into the muscle.   COVID-19 mRNA vaccine, Pfizer, 30 MCG/0.3ML injection INJECT AS DIRECTED   desvenlafaxine (PRISTIQ) 50 MG 24 hr tablet TAKE 1 TABLET BY MOUTH  DAILY (Patient taking differently: daily.)   diltiazem (TIAZAC) 240 MG 24 hr capsule Take 240 mg by mouth daily.   diltiazem 2 % GEL Apply 1 application topically 2 (two) times daily. prn   docusate sodium (COLACE) 100 MG capsule Take 100 mg by mouth daily.   finasteride (PROSCAR) 5 MG tablet Take 5 mg by mouth every morning.    Fluticasone-Salmeterol (ADVAIR DISKUS) 250-50 MCG/DOSE AEPB Inhale 1 puff into the lungs in the morning and at bedtime.   furosemide (LASIX) 20 MG tablet Take 1 tablet (20 mg total) by mouth daily. (Patient taking differently: Take 20 mg by mouth daily. As needed)   GAMMA AMINOBUTYRIC ACID PO Take 750 mg by mouth daily. GABA   hydroxypropyl methylcellulose / hypromellose (ISOPTO TEARS / GONIOVISC) 2.5 % ophthalmic solution Place 1-2 drops into both eyes 3 (three) times daily as needed for dry eyes ((SCHEDULED EACH MORNING)).   Melatonin 3 MG TBDP Take 3 mg by mouth at bedtime.   psyllium (METAMUCIL) 58.6 % packet Take 1 packet by mouth daily.   SHINGRIX injection    tadalafil (CIALIS) 5 MG tablet Take 1 tablet (  5 mg total) by mouth daily as needed for erectile dysfunction.   tamsulosin (FLOMAX) 0.4 MG CAPS Take 0.4 mg by mouth at bedtime.    traMADol (ULTRAM) 50 MG tablet Take 50 mg by mouth every 6 (six) hours as needed.   XARELTO 20 MG TABS tablet TAKE ONE-HALF TABLET BY  MOUTH DAILY   No facility-administered encounter medications on file as of 02/20/2021.    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  right kidny is at 50%   Clotting disorder ([place])    Hx DVT - Xarelto   Colon polyps    Complication of anesthesia    Post op nausea/vomiting   COPD (chronic obstructive pulmonary disease) ([facility])    Depression    severe.  dx 1985   DVT (deep venous thrombosis) ([place])    sees Dr. [name]    GERD (gastroesophageal reflux disease)    History of kidney stones    has kidney stone now.     Hypogonadism male    sees Dr. Heather Roberts at [facility]    Lupus (systemic lupus erythematosus) [facility])    sees Dr. Lorenza Cambridge at [facility]    OSA (obstructive sleep apnea)    Plantar fasciitis    rt foot   PONV (postoperative nausea and vomiting)    Renal atrophy, left    sees Dr. Heather Roberts at [facility]    Sleep apnea    tested 2010  - wears c-pap   Past Surgical History:  Procedure Laterality Date   CHEST WALL TUMOR EXCISION  2007   COLONOSCOPY  04/07/2020   per Dr. Hilarie Fredrickson, adenomatous polyps, repeat in 3 yrs    kidney caluculs  2004-05   KNEE ARTHROSCOPY  09-10-11   right knee, per Dr. Dorna Leitz    KNEE ARTHROSCOPY Right 04/25/2017   Procedure: RIGHT KNEE ARTHROSCOPY, PARTIAL LATERAL MENISECTOMY, CHONDROPLASTY MEDIAL AND LATERAL PATELLAOFEMORAL;  Surgeon: Dorna Leitz, MD;  Location: [facility];  Service: Orthopedics;  Laterality: Right;   madiscus cartilage  2009   MOHS SURGERY     right knee arthroscopy  2008   TONSILLECTOMY     TOTAL KNEE ARTHROPLASTY Right 01/19/2019   Procedure: RIGHT TOTAL KNEE ARTHROPLASTY;  Surgeon: Dorna Leitz, MD;  Location: [facility];  Service: Orthopedics;  Laterality: Right;   UPPER GASTROINTESTINAL ENDOSCOPY     Family History  Problem Relation Age of Onset   Heart disease Other        parents   Hyperlipidemia Other    Stroke Other         grandparents   Sudden death Other        uncle less than 55 yrs old   Heart disease Father    Throat cancer Maternal Grandmother    Liver cancer Paternal Grandmother    Colon cancer Neg Hx    Esophageal cancer Neg Hx    Rectal cancer Neg Hx    Stomach cancer Neg Hx    Social  History   Socioeconomic History   Marital status: Single    Spouse name: Not on file   Number of children: 0   Years of education: Not on file   Highest education level: Not on file  Occupational History   Occupation: retired  Tobacco Use   Smoking status: Never   Smokeless tobacco: Never   Tobacco comments:    NEVER USED TOBACCO  Vaping Use   Vaping Use: Never used  Substance and Sexual Activity   Alcohol use: No    Alcohol/week: 0.0 standard drinks   Drug use: No   Sexual activity: Not on file  Other Topics Concern   Not on file  Social History Narrative   Not on file   Social Determinants of Health   Financial Resource Strain: Low Risk    Difficulty of Paying Living Expenses: Not very hard  Food Insecurity: Not on file  Transportation Needs: No Transportation Needs   Lack of Transportation (Medical): No   Lack of Transportation (Non-Medical): No  Physical Activity: Not on file  Stress: Not on file  Social Connections: Not on file    Tobacco Counseling Counseling given: Not Answered Tobacco comments: NEVER USED TOBACCO   Clinical Intake:                 Diabetic?no         Activities of Daily Living No flowsheet data found.  Patient Care Team: [name], MD as PCP - General Marin Olp, Rudell Cobb, MD as Consulting Physician (Oncology) Viona Gilmore, [facility] as Pharmacist (Pharmacist)  Indicate any recent Medical Services you may have received from other than [facility] providers in the past year (date may be approximate).     Assessment:   This is a routine wellness examination for Jarelle.  Hearing/Vision screen No results found.  Dietary issues and  exercise activities discussed:     Goals Addressed   None    Depression Screen PHQ 2/9 Scores 06/26/2020 06/04/2020  PHQ - 2 Score 0 2  PHQ- 9 Score - 6    Fall Risk Fall Risk  06/26/2020 06/04/2020 05/19/2016 01/28/2016 10/29/2015  Falls in the past year? 0 1 Yes No Yes  Number falls in past yr: - 0 1 - 1  Injury with Fall? - 0 Yes - Yes  Comment - tripped over a hose at home - - -  Risk Factor Category  - - High Fall Risk - -  Risk for fall due to : - - - - -    FALL RISK PREVENTION PERTAINING TO THE HOME:  Any stairs in or around the home? No  If so, are there any without handrails? No  Home free of loose throw rugs in walkways, pet beds, electrical cords, etc? Yes  Adequate lighting in your home to reduce risk of falls? Yes   ASSISTIVE DEVICES UTILIZED TO PREVENT FALLS:  Life alert? No  Use of a cane, walker or w/c? No  Grab bars in the bathroom? No  Shower chair or bench in shower? No  Elevated toilet seat or a handicapped toilet? No   TIMED UP AND GO:  Was the test performed? Yes .  Length of time to ambulate 10 feet: 10 sec.   Gait steady and fast without use of assistive device  Cognitive Function:    Normal cognitive status assessed by direct observation by this Nurse Health Advisor. No abnormalities found.      Immunizations Immunization History  Administered  Date(s) Administered   Fluad Quad(high Dose 65+) 03/24/2019   Influenza Split 04/02/2011, 03/16/2012   Influenza, High Dose Seasonal PF 03/25/2017, 04/11/2018   Influenza,inj,Quad PF,6+ Mos 03/27/2013, 03/25/2014, 04/22/2015, 04/28/2016   Influenza-Unspecified 04/30/2020   PFIZER Comirnaty(Gray Top)Covid-19 Tri-Sucrose Vaccine 11/03/2020   PFIZER(Purple Top)SARS-COV-2 Vaccination 08/19/2019, 09/11/2019, 05/09/2020   Pneumococcal Conjugate-13 04/28/2016   Pneumococcal Polysaccharide-23 07/20/2001, 07/20/2005   Tdap 04/02/2011, 08/02/2015   Zoster Recombinat (Shingrix) 08/28/2020, 09/01/2020    Zoster, Live 08/10/2013    TDAP status: Up to date  Flu Vaccine status: Up to date  Pneumococcal vaccine status: Up to date  Covid-19 vaccine status: Completed vaccines  Qualifies for Shingles Vaccine? Yes   Zostavax completed No   Shingrix Completed?: Yes  Screening Tests Health Maintenance  Topic Date Due   Hepatitis C Screening  Never done   PNA vac Low Risk Adult (2 of 2 - PPSV23) 04/28/2017   Zoster Vaccines- Shingrix (2 of 2) 10/27/2020   INFLUENZA VACCINE  02/02/2021   COLONOSCOPY (Pts 45-82yr Insurance coverage will need to be confirmed)  04/08/2023   TETANUS/TDAP  08/01/2025   COVID-19 Vaccine  Completed   HPV VACCINES  Aged Out    Health Maintenance  Health Maintenance Due  Topic Date Due   Hepatitis C Screening  Never done   PNA vac Low Risk Adult (2 of 2 - PPSV23) 04/28/2017   Zoster Vaccines- Shingrix (2 of 2) 10/27/2020   INFLUENZA VACCINE  02/02/2021    Colorectal cancer screening: Type of screening: Colonoscopy. Completed 04/07/2020. Repeat every 3 years  Lung Cancer Screening: (Low Dose CT Chest recommended if Age 70-80years, 30 pack-year currently smoking OR have quit w/in 15years.) does not qualify.   Lung Cancer Screening Referral: n/a  Additional Screening:  Hepatitis C Screening: does qualify;   Vision Screening: Recommended annual ophthalmology exams for early detection of glaucoma and other disorders of the eye. Is the patient up to date with their annual eye exam?  Yes  Who is the provider or what is the name of the office in which the patient attends annual eye exams? Dr.Daughtry  If pt is not established with a provider, would they like to be referred to a provider to establish care? No .   Dental Screening: Recommended annual dental exams for proper oral hygiene  Community Resource Referral / Chronic Care Management: CRR required this visit?  No   CCM required this visit?  No      Plan:     I have personally reviewed and  noted the following in the patient's chart:   Medical and social history Use of alcohol, tobacco or illicit drugs  Current medications and supplements including opioid prescriptions. Patient is currently taking opioid prescriptions. Information provided to patient regarding non-opioid alternatives. Patient advised to discuss non-opioid treatment plan with their provider. Functional ability and status Nutritional status Physical activity Advanced directives List of other physicians Hospitalizations, surgeries, and ER visits in previous 12 months Vitals Screenings to include cognitive, depression, and falls Referrals and appointments  In addition, I have reviewed and discussed with patient certain preventive protocols, quality metrics, and best practice recommendations. A written personalized care plan for preventive services as well as general preventive health recommendations were provided to patient.     LRandel Pigg LPN   [id number]  Nurse Notes: none

## 2021-02-27 ENCOUNTER — Other Ambulatory Visit: Payer: Self-pay

## 2021-02-27 ENCOUNTER — Ambulatory Visit (INDEPENDENT_AMBULATORY_CARE_PROVIDER_SITE_OTHER): Payer: Medicare Other | Admitting: *Deleted

## 2021-02-27 DIAGNOSIS — E538 Deficiency of other specified B group vitamins: Secondary | ICD-10-CM

## 2021-02-27 MED ORDER — CYANOCOBALAMIN 1000 MCG/ML IJ SOLN
1000.0000 ug | Freq: Once | INTRAMUSCULAR | Status: AC
  Administered 2021-02-27: 1000 ug via INTRAMUSCULAR

## 2021-02-27 NOTE — Progress Notes (Signed)
Per orders of Dr. Fry, injection of Cyanocobalamin 1000mcg given by Meyah Corle A. Patient tolerated injection well.  

## 2021-03-05 ENCOUNTER — Other Ambulatory Visit: Payer: Self-pay

## 2021-03-05 ENCOUNTER — Ambulatory Visit (INDEPENDENT_AMBULATORY_CARE_PROVIDER_SITE_OTHER): Payer: Medicare Other | Admitting: *Deleted

## 2021-03-05 DIAGNOSIS — E538 Deficiency of other specified B group vitamins: Secondary | ICD-10-CM

## 2021-03-05 MED ORDER — CYANOCOBALAMIN 1000 MCG/ML IJ SOLN
1000.0000 ug | Freq: Once | INTRAMUSCULAR | Status: AC
  Administered 2021-03-05: 1000 ug via INTRAMUSCULAR

## 2021-03-05 NOTE — Progress Notes (Signed)
Per orders of Dr. Fry, injection of Cyanocobalamin 1000mcg given by Pistol Kessenich A. Patient tolerated injection well.  

## 2021-03-06 ENCOUNTER — Telehealth: Payer: Medicare Other

## 2021-03-06 ENCOUNTER — Other Ambulatory Visit (HOSPITAL_BASED_OUTPATIENT_CLINIC_OR_DEPARTMENT_OTHER): Payer: Self-pay

## 2021-03-13 ENCOUNTER — Other Ambulatory Visit: Payer: Self-pay

## 2021-03-13 ENCOUNTER — Ambulatory Visit (INDEPENDENT_AMBULATORY_CARE_PROVIDER_SITE_OTHER): Payer: Medicare Other

## 2021-03-13 DIAGNOSIS — E538 Deficiency of other specified B group vitamins: Secondary | ICD-10-CM

## 2021-03-13 MED ORDER — CYANOCOBALAMIN 1000 MCG/ML IJ SOLN
1000.0000 ug | Freq: Once | INTRAMUSCULAR | Status: AC
  Administered 2021-03-13: 1000 ug via INTRAMUSCULAR

## 2021-03-13 NOTE — Progress Notes (Signed)
Per orders of [name], MD, injection of B12 given in right deltoid by Franco Collet. Patient tolerated injection well.  Lab Results  Component Value Date   VITAMINB12 162 (L) 01/29/2021

## 2021-03-13 NOTE — Patient Instructions (Signed)
Health Maintenance Due  Topic Date Due   Hepatitis C Screening  Never done   PNA vac Low Risk Adult (2 of 2 - PPSV23) 04/28/2017   Zoster Vaccines- Shingrix (2 of 2) 10/27/2020   INFLUENZA VACCINE  02/02/2021    Depression screen [facility] 2/9 02/20/2021 02/20/2021 06/26/2020  Decreased Interest 0 0 0  Down, Depressed, Hopeless 0 0 0  PHQ - 2 Score 0 0 0  Altered sleeping - - -  Tired, decreased energy - - -  Change in appetite - - -  Feeling bad or failure about yourself  - - -  Trouble concentrating - - -  Moving slowly or fidgety/restless - - -  Suicidal thoughts - - -  PHQ-9 Score - - -  Difficult doing work/chores - - -

## 2021-03-17 DIAGNOSIS — M25562 Pain in left knee: Secondary | ICD-10-CM | POA: Diagnosis not present

## 2021-03-19 ENCOUNTER — Telehealth: Payer: Self-pay | Admitting: Pharmacist

## 2021-03-19 NOTE — Chronic Care Management (AMB) (Signed)
    Chronic Care Management Pharmacy Assistant   Name: Joseph Tran  MRN: [id number] DOB: March 08, 1951  03/20/2021 APPOINTMENT REMINDER   Called [facility], No answer, left message of appointment on 03/20/2021 at 1:00 via telephone visit with Jeni Salles, Pharm D. Notified to have all medications, supplements, blood pressure and/or blood sugar logs available during appointment and to return call if need to reschedule.  Care Gaps:  AWV - completed 02/20/2021 Hepatitis C Screen - never done Pnuemonia vaccine - overdue 04/28/2017 Zoster vaccines - overdue 02/26/2021 Flu vaccine - due  Star Rating Drug: None   [place] [phone]

## 2021-03-20 ENCOUNTER — Other Ambulatory Visit: Payer: Self-pay

## 2021-03-20 ENCOUNTER — Ambulatory Visit (INDEPENDENT_AMBULATORY_CARE_PROVIDER_SITE_OTHER): Payer: Medicare Other

## 2021-03-20 ENCOUNTER — Ambulatory Visit (INDEPENDENT_AMBULATORY_CARE_PROVIDER_SITE_OTHER): Payer: Medicare Other | Admitting: Pharmacist

## 2021-03-20 DIAGNOSIS — E538 Deficiency of other specified B group vitamins: Secondary | ICD-10-CM

## 2021-03-20 DIAGNOSIS — Z23 Encounter for immunization: Secondary | ICD-10-CM | POA: Diagnosis not present

## 2021-03-20 DIAGNOSIS — F418 Other specified anxiety disorders: Secondary | ICD-10-CM

## 2021-03-20 MED ORDER — CYANOCOBALAMIN 1000 MCG/ML IJ SOLN
1000.0000 ug | Freq: Once | INTRAMUSCULAR | Status: AC
  Administered 2021-03-20: 1000 ug via INTRAMUSCULAR

## 2021-03-20 NOTE — Progress Notes (Signed)
Chronic Care Management Pharmacy Note  04/03/2021 Name:  Joseph Tran MRN:  [id number] DOB:  07-Aug-1950  Summary: Pt reports his nightmares and anxiety is not well controlled   Recommendations/Changes made from today's visit: -Recommended scheduling a visit with behavioral health to help with anxiety and provided telephone number -Advised against Benadryl to help with sleep as this can worsen nightmares   Plan: Follow up with PCP sooner than CPE if there is no change Follow up in 4 months with pharmacist  Subjective: Joseph Tran is an 70 y.o. year old male who is a primary patient of [name], MD.  The CCM team was consulted for assistance with disease management and care coordination needs.    Engaged with patient by telephone for follow up visit in response to provider referral for pharmacy case management and/or care coordination services.   Consent to Services:  The patient was given information about Chronic Care Management services, agreed to services, and gave verbal consent prior to initiation of services.  Please see initial visit note for detailed documentation.   Patient Care Team: [name], MD as PCP - Kristine Garbe, Rudell Cobb, MD as Consulting Physician (Oncology) Viona Gilmore, [facility] as Pharmacist (Pharmacist)  Recent office visits: 03/13/21 Gwenyth Ober, CMA: Patient presented for vitamin B12 injection.  02/20/21 Randel Pigg, LPN: Patient presented for AWV.  01/29/21 [name], MD: Patient presented for neuropathy. Vitamin B12 was low. Recommended weekly injections x 12 weeks and repeat level after this.  12/03/20 [name], MD: Patient presented for cerumen impaction removal.  Recent consult visits: 02/09/21 [name], MD (nephrology): Patient presented for follow up. Decreased cardizem to 120 mg and prescribed Hytrin 1 mg to replace Flomax.  01/30/21 Sherrilyn Rist, MD (pulmonary): Patient presented for OSA on CPAP follow up. Sent order  to DME for CPAP supplies.  12/09/20 [name], MD (hem/oncology): Patient presented for DVT and CKD follow up.  10/10/20 [name], MD (nephrology): Patient presented for follow up. Uric acid elevated, no changes made.  02/27/20 Zenovia Jarred, MD (gastroenterology): Patient presented for colon cancer screening evaluation. Plan to repeat colonoscopy and hold Xarelto prior. Prescribed diltiazem gel and Recticare for hemorrhoids and anal fissures.  Hospital visits: None in previous 6 months  Objective:  Lab Results  Component Value Date   CREATININE 1.67 (H) 12/09/2020   BUN 19 12/09/2020   GFR 43.84 (L) 05/30/2019   GFRNONAA 44 (L) 12/09/2020   GFRAA 52 (L) 06/04/2020   NA 141 12/09/2020   K 3.5 12/09/2020   CALCIUM 9.0 12/09/2020   CO2 22 12/09/2020   GLUCOSE 118 (H) 12/09/2020    Lab Results  Component Value Date/Time   HGBA1C 5.2 01/29/2021 11:25 AM   HGBA1C 4.9 06/04/2020 09:50 AM   GFR 43.84 (L) 05/30/2019 09:03 AM   GFR 38.80 (L) 09/20/2017 08:10 AM    Last diabetic Eye exam: No results found for: HMDIABEYEEXA  Last diabetic Foot exam: No results found for: HMDIABFOOTEX   Lab Results  Component Value Date   CHOL 179 06/04/2020   HDL 43 06/04/2020   LDLCALC 105 (H) 06/04/2020   TRIG 191 (H) 06/04/2020   CHOLHDL 4.2 06/04/2020    Hepatic Function Latest Ref Rng & Units 12/09/2020 09/04/2020 06/04/2020  Total Protein 6.5 - 8.1 g/dL 6.0(L) 6.5 6.2  Albumin 3.5 - 5.0 g/dL 3.9 4.2 -  AST 15 - 41 U/L _0 ALT 0 - 44 U/L 15 16  17  Alk Phosphatase 38 - 126 U/L 69 65 -  Total Bilirubin 0.3 - 1.2 mg/dL 0.6 0.6 0.7  Bilirubin, Direct 0.0 - 0.2 mg/dL - - 0.1    Lab Results  Component Value Date/Time   TSH 1.40 01/29/2021 11:25 AM   TSH 2.15 06/04/2020 09:50 AM   FREET4 0.69 01/29/2021 11:25 AM    CBC Latest Ref Rng & Units 12/09/2020 09/04/2020 06/04/2020  WBC 4.0 - 10.5 K/uL 5.8 5.6 6.1  Hemoglobin 13.0 - 17.0 g/dL 14.6 15.0 15.3  Hematocrit 39.0 - 52.0 % 42.5 44.7  45.3  Platelets 150 - 400 K/uL 163 197 195    No results found for: VD25OH  Clinical ASCVD: No  The 10-year ASCVD risk score (Arnett DK, et al., 2019) is: 18.3%   Values used to calculate the score:     Age: 49 years     Sex: Male     Is Non-Hispanic African American: No     Diabetic: No     Tobacco smoker: No     Systolic Blood Pressure: 161 mmHg     Is BP treated: Yes     HDL Cholesterol: 43 mg/dL     Total Cholesterol: 179 mg/dL    Depression screen [facility] 2/9 02/20/2021 02/20/2021 06/26/2020  Decreased Interest 0 0 0  Down, Depressed, Hopeless 0 0 0  PHQ - 2 Score 0 0 0  Altered sleeping - - -  Tired, decreased energy - - -  Change in appetite - - -  Feeling bad or failure about yourself  - - -  Trouble concentrating - - -  Moving slowly or fidgety/restless - - -  Suicidal thoughts - - -  PHQ-9 Score - - -  Difficult doing work/chores - - -      Social History   Tobacco Use  Smoking Status Never  Smokeless Tobacco Never  Tobacco Comments   NEVER USED TOBACCO   BP Readings from Last 3 Encounters:  02/20/21 122/68  01/30/21 132/78  01/29/21 122/70   Pulse Readings from Last 3 Encounters:  02/20/21 68  01/30/21 74  01/29/21 75   Wt Readings from Last 3 Encounters:  02/20/21 250 lb (113.4 kg)  01/30/21 250 lb 12.8 oz (113.8 kg)  01/29/21 248 lb 6.4 oz (112.7 kg)   BMI Readings from Last 3 Encounters:  02/20/21 33.91 kg/m  01/30/21 34.01 kg/m  01/29/21 32.77 kg/m    Assessment/Interventions: Review of patient past medical history, allergies, medications, health status, including review of consultants reports, laboratory and other test data, was performed as part of comprehensive evaluation and provision of chronic care management services.   SDOH:  (Social Determinants of Health) assessments and interventions performed: Yes  SDOH Screenings   Alcohol Screen: Low Risk    Last Alcohol Screening Score (AUDIT): 0  Depression (PHQ2-9): Low Risk     PHQ-2 Score: 0  Financial Resource Strain: Low Risk    Difficulty of Paying Living Expenses: Not very hard  Food Insecurity: No Food Insecurity   Worried About Charity fundraiser in the Last Year: Never true   Ran Out of Food in the Last Year: Never true  Housing: Low Risk    Last Housing Risk Score: 0  Physical Activity: Sufficiently Active   Days of Exercise per Week: 5 days   Minutes of Exercise per Session: 60 min  Social Connections: Socially Isolated   Frequency of Communication with Friends and Family: Three times a week  Frequency of Social Gatherings with Friends and Family: Three times a week   Attends Religious Services: Never   Active Member of Clubs or Organizations: No   Attends Archivist Meetings: Never   Marital Status: Never married  Stress: No Stress Concern Present   Feeling of Stress : Not at all  Tobacco Use: Low Risk    Smoking Tobacco Use: Never   Smokeless Tobacco Use: Never  Transportation Needs: No Transportation Needs   Lack of Transportation (Medical): No   Lack of Transportation (Non-Medical): No    CCM Care Plan  Allergies  Allergen Reactions   Allopurinol Other (See Comments)    PATIENT PREFERENCE Pt refused due to mom developing steven johnson syndrome.   Penicillins Hives    Has patient had a PCN reaction causing immediate rash, facial/tongue/throat swelling, SOB or lightheadedness with hypotension: Unknown Has patient had a PCN reaction causing severe rash involving mucus membranes or skin necrosis:Unknown Has patient had a PCN reaction that required hospitalization: No Has patient had a PCN reaction occurring within the last 10 years: No If all of the above answers are "NO", then may proceed with Cephalosporin use.    Sulfamethoxazole Hives   Sulfonamide Derivatives Hives   Dilaudid [Hydromorphone Hcl] Nausea And Vomiting    Medications Reviewed Today     Reviewed by Viona Gilmore, [facility] (Pharmacist) on 03/20/21 at  1323  Med List Status: <None>   Medication Order Taking? Sig Documenting Provider Last Dose Status Informant  acetaminophen (TYLENOL) 500 MG tablet [id number]  Take 1,000 mg by mouth as needed.  [provider]  Active   albuterol (PROAIR HFA) 108 (90 Base) MCG/ACT inhaler [id number]  INHALE 2 PUFFS EVERY 4  HOURS AS NEEDED FOR  WHEEZING OR SHORTNESS OF  BREATH  Patient taking differently: INHALE 2 PUFFS EVERY 4  HOURS AS NEEDED FOR  WHEEZING OR SHORTNESS OF  Damian Leavell, MD  Active   aspirin EC 81 MG tablet [id number]  Take 162 mg by mouth daily. [provider]  Active Self           Med Note ([place], PATTI E   Wed Feb 27, 2020  8:43 AM)    bisacodyl (DULCOLAX) 5 MG EC tablet [id number]  Take 5 mg by mouth at bedtime. [provider]  Active Self  buPROPion (WELLBUTRIN XL) 300 MG 24 hr tablet [id number] Yes TAKE 1 TABLET BY MOUTH  DAILY [name], MD Taking Active   clindamycin (CLEOCIN) 300 MG capsule [id number]  TAKE 2 CAPSULES BY MOUTH 1 HOUR BEFORE DENTAL APPOINTMENT [provider]  Active   clonazePAM (KLONOPIN) 1 MG tablet [id number] Yes TAKE 1 TABLET BY MOUTH  TWICE DAILY AS NEEDED FOR  ANXIETY [name], MD Taking Active   desvenlafaxine (PRISTIQ) 50 MG 24 hr tablet [id number] Yes TAKE 1 TABLET BY MOUTH  DAILY  Patient taking differently: daily.   [name], MD Taking Active            Med Note [facility], Lanyah Spengler G   Fri Mar 20, 2021  1:18 PM)    diltiazem [facility]) 120 MG 24 hr capsule [id number] Yes Take 120 mg by mouth daily. [provider] Taking Active   diltiazem 2 % GEL [id number]  Apply 1 application topically 2 (two) times daily. prn [provider]  Active   docusate sodium (COLACE) 100 MG capsule [id number]  Take 100 mg by mouth daily. [provider]  Active   finasteride (PROSCAR) 5 MG tablet [id number]  Take 5 mg by mouth every morning.  [provider]  Active Self   Fluticasone-Salmeterol (ADVAIR DISKUS) 250-50 MCG/DOSE AEPB [id number]  Inhale 1 puff into the lungs in the morning and at bedtime. Laurin Coder, MD  Active   furosemide (LASIX) 20 MG tablet [id number]  Take 1 tablet (20 mg total) by mouth daily.  Patient taking differently: Take 20 mg by mouth daily. As needed   Volanda Napoleon, MD  Active   GAMMA AMINOBUTYRIC ACID PO [id number]  Take 750 mg by mouth daily. GABA [provider]  Active Self  hydroxypropyl methylcellulose / hypromellose (ISOPTO TEARS / GONIOVISC) 2.5 % ophthalmic solution [id number]  Place 1-2 drops into both eyes 3 (three) times daily as needed for dry eyes ((SCHEDULED EACH MORNING)). [provider]  Active Self  Melatonin 3 MG TBDP [id number] Yes Take 3 mg by mouth at bedtime. [provider] Taking Active Self  psyllium (METAMUCIL) 58.6 % packet [id number]  Take 1 packet by mouth daily. [provider]  Active   tadalafil (CIALIS) 5 MG tablet [id number]  Take 1 tablet (5 mg total) by mouth daily as needed for erectile dysfunction. [name], MD  Active   terazosin (HYTRIN) 1 MG capsule [id number] Yes Take 1 mg by mouth at bedtime. [provider] Taking Active   traMADol (ULTRAM) 50 MG tablet [id number]  Take 50 mg by mouth every 6 (six) hours as needed. [provider]  Active   XARELTO 20 MG TABS tablet [id number]  TAKE ONE-HALF TABLET BY  MOUTH DAILY [place], Rudell Cobb, MD  Active   Med List Note Marylynn Pearson 04/19/17 2671): CPAP WITH SLEEP.            Patient Active Problem List   Diagnosis Date Noted   Hyperglycemia 05/30/2019   Primary osteoarthritis of right knee 01/19/2019   CKD (chronic kidney disease), stage III ([place]) 09/13/2017   Renal atrophy, left 09/13/2017   Systemic lupus erythematosus ([place]) 09/13/2017   Dyslipidemia 09/13/2017   Acute meniscal tear, lateral, right, initial encounter 04/25/2017   Osteoarthritis of right knee  04/25/2017   Concussion with loss of consciousness 08/05/2015   Laceration of spleen 08/05/2015   Lumbar stress fracture 08/05/2015   DVT (deep venous thrombosis) ([place]) 10/18/2014   Hx of bacterial pneumonia 03/10/2013   COLONIC POLYPS, HX OF 04/08/2010   NEPHROLITHIASIS, HX OF 04/08/2010   ACOUSTIC NEUROMA 04/07/2010   Hypogonadism male 04/07/2010   Depression with anxiety 04/07/2010   SLEEP APNEA, OBSTRUCTIVE 04/07/2010   Asthma 04/07/2010   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   ERECTILE DYSFUNCTION, 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   PLANTAR FASCIITIS 04/07/2010    Immunization History  Administered Date(s) Administered   Fluad Quad(high Dose 65+) 03/24/2019   Influenza Split 04/02/2011, 03/16/2012   Influenza, High Dose Seasonal PF 03/25/2017, 04/11/2018   Influenza,inj,Quad PF,6+ Mos 03/27/2013, 03/25/2014, 04/22/2015, 04/28/2016   Influenza-Unspecified 04/30/2020   PFIZER Comirnaty(Gray Top)Covid-19 Tri-Sucrose Vaccine 11/03/2020   PFIZER(Purple Top)SARS-COV-2 Vaccination 08/19/2019, 09/11/2019, 05/09/2020   Pfizer Covid-19 Vaccine Bivalent Booster 65yr & up 03/31/2021   Pneumococcal Conjugate-13 04/28/2016   Pneumococcal Polysaccharide-23 07/20/2001, 07/20/2005   Tdap 04/02/2011, 08/02/2015   Zoster Recombinat (Shingrix) 08/28/2020, 10/27/2020   Zoster, Live 08/10/2013   Patient reports he still has some numbness in the ball of his foot forward but is doing better with the vitamin B12 injections.   He also  has concerns about his anxiety getting worse in the morning and sometimes he wakes up crying. Recommended avoiding Benadryl as this has the potential to worsen nightmares. The anxiety "attack" usually occurs around 5 am and they don't occur every night. Patient has only been taking clonazepam a couple times a week and has slowly backed down on his use of that. He does think he might be bipolar.  Conditions to be addressed/monitored:  Hypertension, Asthma, Depression, Anxiety,  Osteoarthritis, BPH and DVT, fluid retention, and constipation  Conditions addressed this visit: Hypertension, depression, anxiety  Care Plan : [place]  Updates made by Viona Gilmore, [place] since 04/03/2021 12:00 AM     Problem: Problem: Hypertension, Asthma, Depression, Anxiety, Osteoarthritis, BPH and DVT, fluid retention, and constipation      Long-Range Goal: Patient-Specific Goal   Start Date: 10/14/2020  Expected End Date: 10/14/2021  Recent Progress: On track  Priority: High  Note:   Current Barriers:  Unable to independently monitor therapeutic efficacy Unable to achieve control of blood pressure  Unable to maintain control of anxiety/depression  Pharmacist Clinical Goal(s):  Patient will achieve adherence to monitoring guidelines and medication adherence to achieve therapeutic efficacy achieve control of blood pressure as evidenced by home blood pressure readings  through collaboration with PharmD and provider.   Interventions: 1:1 collaboration with [name], MD regarding development and update of comprehensive plan of care as evidenced by provider attestation and co-signature Inter-disciplinary care team collaboration (see longitudinal plan of care) Comprehensive medication review performed; medication list updated in electronic medical record  Hypertension (BP goal <130/80) -Uncontrolled -Current treatment: Diltiazem 120 mg 1 capsule daily -Medications previously tried: none  -Current home readings: 138/80, 130/70 (arm cuff; checks once a week at work)  -Current dietary habits: staying away from salt (doesn't add to anything and looks at sodium content of everything) and potassium -Current exercise habits: not going to the gym with COVID, active 4-5 hours a day taking -Denies hypotensive/hypertensive symptoms -Educated on Exercise goal of 150 minutes per week; Importance of home blood pressure monitoring; Proper BP monitoring  technique; -Counseled to monitor BP at home weekly, document, and provide log at future appointments -Counseled on diet and exercise extensively Recommended to continue current medication  Asthma/sleep apnea (Goal: control symptoms) -Controlled -Current treatment  Advair 250-50 mcg/dose 1 puff twice daily Albuterol 108 mcg/act 1 puff as needed (2 times a week) -Medications previously tried: none  -Exacerbations requiring treatment in last 6 months: none -Patient denies consistent use of maintenance inhaler -Frequency of rescue inhaler use: twice weekly -Counseled on Benefits of consistent maintenance inhaler use Differences between maintenance and rescue inhalers -Recommended to continue current medication Recommended spirometry testing Patient does not use Advair or wish to use it as his cough has resolved  Depression/Anxiety (Goal: minimize symptoms) -Controlled -Current treatment: Bupropion XL 300 mg 1 tablet daily - in AM Clonazepam 1 mg 1 tablet twice daily as needed for anxiety (taking 1/2 tablet before bed) Pristiq 25 mg 1 tablet daily - in AM -Medications previously tried/failed: Lexapro (unknown), Effexor (unknown) -PHQ9: 0 -GAD7: n/a -Connected with behavioral health [id number]) for mental health support -Educated on Benefits of medication for symptom control Benefits of cognitive-behavioral therapy with or without medication -Counseled on long term risks of taking benzodiazepines and patient reported nightmares have lessened since decreasing dose  Recommended contacting behavioral health as this can likely help with an appropriate diagnosis.  DVT history (Goal: prevent future blood clots) -Controlled -  Current treatment  Xarelto 10 mg 1 tablet daily Aspirin 81 mg 2 tablets daily -Medications previously tried: none  -Counseled on monitoring for signs of bleeding such as unexplained and excessive bleeding from a cut or injury, easy or excessive bruising, blood in  urine or stools, and nosebleeds without a known cause  BPH (Goal: minimize symptoms) -Controlled -Current treatment  Finasteride 5 mg 1 tablet daily (in AM) Tamsulosin 0.4 mg 1 capsule daily (in PM) Cialis 5 mg 1 tablet daily as needed -Medications previously tried: none  -Recommended to continue current medication Counseled on consistent administration of tamsulosin after food  Fluid retention (Goal: minimize swelling) -Controlled -Current treatment  Furosemide 20 mg 1 tablet daily or as needed -Medications previously tried: none  - Patient only takes if right ankle swells with salt in diet and he does watch his salt intake and uses low sodium salt morton lite  Osteoarthritis (Goal: minimize pain) -Controlled -Current treatment  Tramadol 50 mg 1 tablet as needed Acetaminophen 500 mg 2 tablets as needed -Medications previously tried: none  -Recommended to continue current medication  Constipation (Goal: regular bowel movements) -Controlled -Current treatment  Bisacodyl 5 mg 1 tablet at bedtime Sennakot 1 tablet daily Metamucil 1 spoonful daily -Medications previously tried: none  -Recommended to continue current medication  Health Maintenance -Vaccine gaps: Prevnar 20 or Pneumovax 23, second dose of shingrix -Current therapy:  GABBA 750 mg 1 tablet daily in evening  Melatonin 3 mg 1 tablet at bedtime  Diltiazem gel 2% once to twice daily Isopto tears 1 drop 1-2 times a day (artificial tears) -Educated on Cost vs benefit of each product must be carefully weighed by individual consumer -Patient is satisfied with current therapy and denies issues -Recommended to continue current medication  Patient Goals/Self-Care Activities Patient will:  - take medications as prescribed check blood pressure twice weekly, document, and provide at future appointments target a minimum of 150 minutes of moderate intensity exercise weekly  Follow Up Plan: Telephone follow up appointment  with care management team member scheduled for: 4-5 months        Medication Assistance: None required.  Patient affirms current coverage meets needs.  Compliance/Adherence/Medication fill history: Care Gaps: Hep C screening, influenza   Star-Rating Drugs: None  Patient's preferred pharmacy is:  [facility]  ([place], [place] [facility] [place] [address] [address] Phone: [phone] Fax: [facility] #[id number] - [place], [place] - [address] [place] [address] [place] [place] [address] Phone: [phone] Fax: [phone]  [place], [place] [address] Phone: [phone] Fax: [phone]  Uses pill box? No - uses a shelf on his kitchen cabinet separated into morning and evening Pt endorses 100% compliance  We discussed: Current pharmacy is preferred with insurance plan and patient is satisfied with pharmacy services Patient decided to: Continue current medication management strategy  Care Plan and Follow Up Patient Decision:  Patient agrees to Care Plan and Follow-up.  Plan: Telephone follow up appointment with care management team member scheduled for:  4 months  Jeni Salles, PharmD [place] Pharmacist [place] [facility] [place] [phone]

## 2021-03-20 NOTE — Progress Notes (Signed)
Per orders of Dr. Fry, injection of Cyanocobalamin 1000 mcg given by Envy Meno L Cassidee Deats. °Patient tolerated injection well.  °

## 2021-03-25 ENCOUNTER — Encounter: Payer: Self-pay | Admitting: Family Medicine

## 2021-03-26 ENCOUNTER — Other Ambulatory Visit: Payer: Self-pay

## 2021-03-26 ENCOUNTER — Ambulatory Visit (INDEPENDENT_AMBULATORY_CARE_PROVIDER_SITE_OTHER): Payer: Medicare Other

## 2021-03-26 DIAGNOSIS — M25562 Pain in left knee: Secondary | ICD-10-CM | POA: Diagnosis not present

## 2021-03-26 DIAGNOSIS — E538 Deficiency of other specified B group vitamins: Secondary | ICD-10-CM | POA: Diagnosis not present

## 2021-03-26 MED ORDER — CYANOCOBALAMIN 1000 MCG/ML IJ SOLN
1000.0000 ug | Freq: Once | INTRAMUSCULAR | Status: AC
  Administered 2021-03-26: 1000 ug via INTRAMUSCULAR

## 2021-03-26 NOTE — Telephone Encounter (Signed)
I advise anyone who has sex with men or who are in contact with someone who has sex with men to get the monkeypox vaccine. He can contact the [facility] about this. As for the new bivalent Covid vaccine, yes I advise him to get it

## 2021-03-26 NOTE — Progress Notes (Signed)
Per orders of Dr. Fry, injection of B12 given by Mirabel Ahlgren. Patient tolerated injection well.  

## 2021-03-27 ENCOUNTER — Ambulatory Visit: Payer: Medicare Other

## 2021-03-31 ENCOUNTER — Ambulatory Visit: Payer: Medicare Other | Attending: Internal Medicine

## 2021-03-31 DIAGNOSIS — Z23 Encounter for immunization: Secondary | ICD-10-CM

## 2021-03-31 NOTE — Progress Notes (Signed)
   Covid-19 Vaccination Clinic  Name:  Joseph Tran    MRN: [id number] DOB: [birth year]  03/31/2021  Joseph Tran was observed post Covid-19 immunization for 15 minutes without incident. He was provided with Vaccine Information Sheet and instruction to access the V-Safe system.   Joseph Tran was instructed to call 911 with any severe reactions post vaccine: Difficulty breathing  Swelling of face and throat  A fast heartbeat  A bad rash all over body  Dizziness and weakness

## 2021-04-02 DIAGNOSIS — S83241A Other tear of medial meniscus, current injury, right knee, initial encounter: Secondary | ICD-10-CM | POA: Diagnosis not present

## 2021-04-02 DIAGNOSIS — M25561 Pain in right knee: Secondary | ICD-10-CM | POA: Diagnosis not present

## 2021-04-03 ENCOUNTER — Ambulatory Visit (INDEPENDENT_AMBULATORY_CARE_PROVIDER_SITE_OTHER): Payer: Medicare Other

## 2021-04-03 ENCOUNTER — Other Ambulatory Visit: Payer: Self-pay

## 2021-04-03 DIAGNOSIS — F418 Other specified anxiety disorders: Secondary | ICD-10-CM

## 2021-04-03 DIAGNOSIS — E538 Deficiency of other specified B group vitamins: Secondary | ICD-10-CM

## 2021-04-03 MED ORDER — CYANOCOBALAMIN 1000 MCG/ML IJ SOLN
1000.0000 ug | Freq: Once | INTRAMUSCULAR | Status: AC
  Administered 2021-04-03: 1000 ug via INTRAMUSCULAR

## 2021-04-03 NOTE — Patient Instructions (Signed)
Hi Joseph Tran,  It was great catching up with you again! Please try to stay away from the benadryl as this can possibly worsen nightmares. Also, there are medications that may help lessen nightmares if you want to consider that.  Here is the behavioral health phone number we discussed: [phone]. I think they may be useful for you!  Please reach out to me if you have any questions or need anything before our follow up!  Best, Maddie  Jeni Salles, PharmD, [facility]   Visit Information   Goals Addressed   None    Patient Care Plan: CCM Pharmacy Care Plan     Problem Identified: Problem: Hypertension, Asthma, Depression, Anxiety, Osteoarthritis, BPH and DVT, fluid retention, and constipation      Long-Range Goal: Patient-Specific Goal   Start Date: 10/14/2020  Expected End Date: 10/14/2021  Recent Progress: On track  Priority: High  Note:   Current Barriers:  Unable to independently monitor therapeutic efficacy Unable to achieve control of blood pressure  Unable to maintain control of anxiety/depression  Pharmacist Clinical Goal(s):  Patient will achieve adherence to monitoring guidelines and medication adherence to achieve therapeutic efficacy achieve control of blood pressure as evidenced by home blood pressure readings  through collaboration with PharmD and provider.   Interventions: 1:1 collaboration with [name], MD regarding development and update of comprehensive plan of care as evidenced by provider attestation and co-signature Inter-disciplinary care team collaboration (see longitudinal plan of care) Comprehensive medication review performed; medication list updated in electronic medical record  Hypertension (BP goal <130/80) -Uncontrolled -Current treatment: Diltiazem 120 mg 1 capsule daily -Medications previously tried: none  -Current home readings: 138/80, 130/70 (arm cuff; checks once a  week at work)  -Current dietary habits: staying away from salt (doesn't add to anything and looks at sodium content of everything) and potassium -Current exercise habits: not going to the gym with COVID, active 4-5 hours a day taking -Denies hypotensive/hypertensive symptoms -Educated on Exercise goal of 150 minutes per week; Importance of home blood pressure monitoring; Proper BP monitoring technique; -Counseled to monitor BP at home weekly, document, and provide log at future appointments -Counseled on diet and exercise extensively Recommended to continue current medication  Asthma/sleep apnea (Goal: control symptoms) -Controlled -Current treatment  Advair 250-50 mcg/dose 1 puff twice daily Albuterol 108 mcg/act 1 puff as needed (2 times a week) -Medications previously tried: none  -Exacerbations requiring treatment in last 6 months: none -Patient denies consistent use of maintenance inhaler -Frequency of rescue inhaler use: twice weekly -Counseled on Benefits of consistent maintenance inhaler use Differences between maintenance and rescue inhalers -Recommended to continue current medication Recommended spirometry testing Patient does not use Advair or wish to use it as his cough has resolved  Depression/Anxiety (Goal: minimize symptoms) -Controlled -Current treatment: Bupropion XL 300 mg 1 tablet daily - in AM Clonazepam 1 mg 1 tablet twice daily as needed for anxiety (taking 1/2 tablet before bed) Pristiq 25 mg 1 tablet daily - in AM -Medications previously tried/failed: Lexapro (unknown), Effexor (unknown) -PHQ9: 0 -GAD7: n/a -Connected with behavioral health [phone]) for mental health support -Educated on Benefits of medication for symptom control Benefits of cognitive-behavioral therapy with or without medication -Counseled on long term risks of taking benzodiazepines and patient reported nightmares have lessened since decreasing dose  Recommended contacting  behavioral health as this can likely help with an appropriate diagnosis.  DVT history (Goal: prevent future blood clots) -Controlled -Current  treatment  Xarelto 10 mg 1 tablet daily Aspirin 81 mg 2 tablets daily -Medications previously tried: none  -Counseled on monitoring for signs of bleeding such as unexplained and excessive bleeding from a cut or injury, easy or excessive bruising, blood in urine or stools, and nosebleeds without a known cause  BPH (Goal: minimize symptoms) -Controlled -Current treatment  Finasteride 5 mg 1 tablet daily (in AM) Tamsulosin 0.4 mg 1 capsule daily (in PM) Cialis 5 mg 1 tablet daily as needed -Medications previously tried: none  -Recommended to continue current medication Counseled on consistent administration of tamsulosin after food  Fluid retention (Goal: minimize swelling) -Controlled -Current treatment  Furosemide 20 mg 1 tablet daily or as needed -Medications previously tried: none  - Patient only takes if right ankle swells with salt in diet and he does watch his salt intake and uses low sodium salt morton lite  Osteoarthritis (Goal: minimize pain) -Controlled -Current treatment  Tramadol 50 mg 1 tablet as needed Acetaminophen 500 mg 2 tablets as needed -Medications previously tried: none  -Recommended to continue current medication  Constipation (Goal: regular bowel movements) -Controlled -Current treatment  Bisacodyl 5 mg 1 tablet at bedtime Sennakot 1 tablet daily Metamucil 1 spoonful daily -Medications previously tried: none  -Recommended to continue current medication  Health Maintenance -Vaccine gaps: Prevnar 20 or Pneumovax 23, second dose of shingrix -Current therapy:  GABBA 750 mg 1 tablet daily in evening  Melatonin 3 mg 1 tablet at bedtime  Diltiazem gel 2% once to twice daily Isopto tears 1 drop 1-2 times a day (artificial tears) -Educated on Cost vs benefit of each product must be carefully weighed by individual  consumer -Patient is satisfied with current therapy and denies issues -Recommended to continue current medication  Patient Goals/Self-Care Activities Patient will:  - take medications as prescribed check blood pressure twice weekly, document, and provide at future appointments target a minimum of 150 minutes of moderate intensity exercise weekly  Follow Up Plan: Telephone follow up appointment with care management team member scheduled for: 4-5 months       Patient verbalizes understanding of instructions provided today and agrees to view in [place].  Telephone follow up appointment with pharmacy team member scheduled for: 4 months  Viona Gilmore, [facility]

## 2021-04-03 NOTE — Progress Notes (Signed)
Per orders of Dr. Fry, injection of Cyanocobalamin 1000 mcg given by Marykate Heuberger L Lailyn Appelbaum. °Patient tolerated injection well.  °

## 2021-04-06 ENCOUNTER — Ambulatory Visit: Payer: Medicare Other | Admitting: Hematology & Oncology

## 2021-04-06 ENCOUNTER — Other Ambulatory Visit: Payer: Medicare Other

## 2021-04-07 ENCOUNTER — Other Ambulatory Visit: Payer: Medicare Other

## 2021-04-07 ENCOUNTER — Ambulatory Visit: Payer: Medicare Other | Admitting: Hematology & Oncology

## 2021-04-10 ENCOUNTER — Other Ambulatory Visit (HOSPITAL_BASED_OUTPATIENT_CLINIC_OR_DEPARTMENT_OTHER): Payer: Self-pay

## 2021-04-10 ENCOUNTER — Ambulatory Visit (INDEPENDENT_AMBULATORY_CARE_PROVIDER_SITE_OTHER): Payer: Medicare Other

## 2021-04-10 ENCOUNTER — Other Ambulatory Visit: Payer: Self-pay

## 2021-04-10 DIAGNOSIS — E538 Deficiency of other specified B group vitamins: Secondary | ICD-10-CM | POA: Diagnosis not present

## 2021-04-10 DIAGNOSIS — Z23 Encounter for immunization: Secondary | ICD-10-CM | POA: Diagnosis not present

## 2021-04-10 MED ORDER — CYANOCOBALAMIN 1000 MCG/ML IJ SOLN
1000.0000 ug | INTRAMUSCULAR | Status: AC
  Administered 2021-04-10 – 2021-04-21 (×2): 1000 ug via INTRAMUSCULAR

## 2021-04-10 MED ORDER — COVID-19MRNA BIVAL VACC PFIZER 30 MCG/0.3ML IM SUSP
INTRAMUSCULAR | 0 refills | Status: DC
  Filled 2021-04-10: qty 0.3, 1d supply, fill #0

## 2021-04-10 NOTE — Progress Notes (Signed)
Pt here for weekly B12 injection #10 of 12 per Dr Sarajane Jews.  B12 1058mcg given IM in right deltoid and pt tolerated injection well.  Next B12 injection scheduled for 04/17/21.

## 2021-04-16 ENCOUNTER — Other Ambulatory Visit: Payer: Self-pay

## 2021-04-16 ENCOUNTER — Inpatient Hospital Stay (HOSPITAL_BASED_OUTPATIENT_CLINIC_OR_DEPARTMENT_OTHER): Payer: Medicare Other | Admitting: Hematology & Oncology

## 2021-04-16 ENCOUNTER — Encounter: Payer: Self-pay | Admitting: Hematology & Oncology

## 2021-04-16 ENCOUNTER — Other Ambulatory Visit: Payer: Self-pay | Admitting: Oncology

## 2021-04-16 ENCOUNTER — Inpatient Hospital Stay: Payer: Medicare Other | Attending: Hematology & Oncology

## 2021-04-16 VITALS — BP 138/85 | HR 71 | Temp 98.2°F | Resp 20 | Wt 247.8 lb

## 2021-04-16 DIAGNOSIS — I82811 Embolism and thrombosis of superficial veins of right lower extremities: Secondary | ICD-10-CM | POA: Insufficient documentation

## 2021-04-16 DIAGNOSIS — I82511 Chronic embolism and thrombosis of right femoral vein: Secondary | ICD-10-CM | POA: Insufficient documentation

## 2021-04-16 DIAGNOSIS — I82531 Chronic embolism and thrombosis of right popliteal vein: Secondary | ICD-10-CM | POA: Diagnosis not present

## 2021-04-16 DIAGNOSIS — N183 Chronic kidney disease, stage 3 unspecified: Secondary | ICD-10-CM

## 2021-04-16 DIAGNOSIS — I82401 Acute embolism and thrombosis of unspecified deep veins of right lower extremity: Secondary | ICD-10-CM | POA: Diagnosis not present

## 2021-04-16 DIAGNOSIS — D510 Vitamin B12 deficiency anemia due to intrinsic factor deficiency: Secondary | ICD-10-CM

## 2021-04-16 DIAGNOSIS — Z7982 Long term (current) use of aspirin: Secondary | ICD-10-CM | POA: Diagnosis not present

## 2021-04-16 DIAGNOSIS — N289 Disorder of kidney and ureter, unspecified: Secondary | ICD-10-CM | POA: Insufficient documentation

## 2021-04-16 DIAGNOSIS — Z79899 Other long term (current) drug therapy: Secondary | ICD-10-CM | POA: Insufficient documentation

## 2021-04-16 DIAGNOSIS — Z7901 Long term (current) use of anticoagulants: Secondary | ICD-10-CM | POA: Diagnosis not present

## 2021-04-16 LAB — CMP (CANCER CENTER ONLY)
ALT: 16 U/L (ref 0–44)
AST: 20 U/L (ref 15–41)
Albumin: 3.8 g/dL (ref 3.5–5.0)
Alkaline Phosphatase: 72 U/L (ref 38–126)
Anion gap: 7 (ref 5–15)
BUN: 17 mg/dL (ref 8–23)
CO2: 24 mmol/L (ref 22–32)
Calcium: 9.3 mg/dL (ref 8.9–10.3)
Chloride: 110 mmol/L (ref 98–111)
Creatinine: 1.87 mg/dL — ABNORMAL HIGH (ref 0.61–1.24)
GFR, Estimated: 38 mL/min — ABNORMAL LOW (ref >60)
Glucose, Bld: 102 mg/dL — ABNORMAL HIGH (ref 70–99)
Potassium: 4.4 mmol/L (ref 3.5–5.1)
Sodium: 141 mmol/L (ref 135–145)
Total Bilirubin: 0.5 mg/dL (ref 0.3–1.2)
Total Protein: 6.6 g/dL (ref 6.5–8.1)

## 2021-04-16 LAB — CBC WITH DIFFERENTIAL (CANCER CENTER ONLY)
Abs Immature Granulocytes: 0.04 10*3/uL (ref 0.00–0.07)
Basophils Absolute: 0.1 10*3/uL (ref 0.0–0.1)
Basophils Relative: 1 %
Eosinophils Absolute: 0.3 10*3/uL (ref 0.0–0.5)
Eosinophils Relative: 5 %
HCT: 44.4 % (ref 39.0–52.0)
Hemoglobin: 15 g/dL (ref 13.0–17.0)
Immature Granulocytes: 1 %
Lymphocytes Relative: 17 %
Lymphs Abs: 1 10*3/uL (ref 0.7–4.0)
MCH: 31.8 pg (ref 26.0–34.0)
MCHC: 33.8 g/dL (ref 30.0–36.0)
MCV: 94.1 fL (ref 80.0–100.0)
Monocytes Absolute: 0.3 10*3/uL (ref 0.1–1.0)
Monocytes Relative: 6 %
Neutro Abs: 4 10*3/uL (ref 1.7–7.7)
Neutrophils Relative %: 70 %
Platelet Count: 177 10*3/uL (ref 150–400)
RBC: 4.72 MIL/uL (ref 4.22–5.81)
RDW: 12.1 % (ref 11.5–15.5)
WBC Count: 5.8 10*3/uL (ref 4.0–10.5)
nRBC: 0 % (ref 0.0–0.2)

## 2021-04-16 MED ORDER — FUROSEMIDE 20 MG PO TABS: 20.0000 mg | ORAL_TABLET | Freq: Every day | ORAL | 1 refills | Status: DC

## 2021-04-16 NOTE — Progress Notes (Addendum)
Hematology and Oncology Follow Up Visit  Joseph Tran [id number] Feb 13, 1951 70 y.o. 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 Towson is here today for follow-up.  I last saw him back in June.  He is doing fairly well.  He has had no problems since we last saw him.  He does have some problems with his left knee.  He had MRI of the knee.  He said that there was a meniscal tear but also a fracture.  He sees one of the local orthopedist.  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  He says the next show will be in March 2023.  He is on Xarelto and baby aspirin.  He has little bit of swelling in the right leg.  I think this may have happened after he had knee replacement surgery.  This really does not surprise me all that much.  He was recently found to have low vitamin B12.  His vitamin B12 level back in July was 162.  He now is on weekly vitamin B12 injections.  He says he gets 12 injections.  He has 1 more left.  He has had no bleeding.  There is no change in bowel or bladder habits.  He does have some mild renal insufficiency.  He is followed by nephrology for this.  He has had no cough or shortness of breath.  There has been no issues with COVID.  He has had no rashes.  Overall, I would say his performance status is probably ECOG 1.     Medications:  Allergies as of 04/16/2021       Reactions   Allopurinol Other (See Comments)   PATIENT PREFERENCE Pt refused due to mom developing steven johnson syndrome.   Penicillins Hives   Has patient had a PCN reaction causing immediate rash, facial/tongue/throat swelling, SOB or lightheadedness with hypotension: Unknown Has patient had a PCN reaction causing severe rash involving  mucus membranes or skin necrosis:Unknown Has patient had a PCN reaction that required hospitalization: No Has patient had a PCN reaction occurring within the last 10 years: No If all of the above answers are "NO", then may proceed with Cephalosporin use.   Sulfamethoxazole Hives   Sulfonamide Derivatives Hives   Dilaudid [hydromorphone Hcl] Nausea And Vomiting        Medication List        Accurate as of April 16, 2021  4:01 PM. If you have any questions, ask your nurse or doctor.          STOP taking these medications    diltiazem 120 MG 24 hr capsule Commonly known as: TIAZAC Stopped by: Volanda Napoleon, MD   Pfizer COVID-19 Vac Bivalent injection Generic drug: COVID-19 mRNA bivalent vaccine Therapist, music) Stopped by: Volanda Napoleon, MD   terazosin 1 MG capsule Commonly known as: HYTRIN Stopped by: Volanda Napoleon, MD       TAKE these medications    acetaminophen 500 MG tablet Commonly known as: TYLENOL Take 1,000 mg by mouth as needed.   albuterol 108 (90 Base) MCG/ACT inhaler Commonly known as: ProAir HFA INHALE 2 PUFFS EVERY 4  HOURS AS NEEDED FOR  WHEEZING OR  SHORTNESS OF  BREATH   APPLE CIDER VINEGAR PO Take by mouth. Takes 1 1/2 tbsp daily in cherry juice.   aspirin EC 81 MG tablet Take 162 mg by mouth daily.   bisacodyl 5 MG EC tablet Commonly known as: DULCOLAX Take 5 mg by mouth at bedtime.   buPROPion 300 MG 24 hr tablet Commonly known as: WELLBUTRIN XL TAKE 1 TABLET BY MOUTH  DAILY   Cholecalciferol 50 MCG (2000 UT) Tabs Take by mouth.   clindamycin 300 MG capsule Commonly known as: CLEOCIN TAKE 2 CAPSULES BY MOUTH 1 HOUR BEFORE DENTAL APPOINTMENT   clonazePAM 1 MG tablet Commonly known as: KLONOPIN TAKE 1 TABLET BY MOUTH  TWICE DAILY AS NEEDED FOR  ANXIETY   desvenlafaxine 50 MG 24 hr tablet Commonly known as: PRISTIQ TAKE 1 TABLET BY MOUTH  DAILY What changed:  how much to take how to take this   diltiazem 2 % Gel Apply 1  application topically 2 (two) times daily. prn   diltiazem 240 MG 24 hr capsule Commonly known as: CARDIZEM CD Take 240 mg by mouth daily.   docusate sodium 100 MG capsule Commonly known as: COLACE Take 100 mg by mouth daily.   finasteride 5 MG tablet Commonly known as: PROSCAR Take 5 mg by mouth every morning.   Fluticasone-Salmeterol 250-50 MCG/DOSE Aepb Commonly known as: Advair Diskus Inhale 1 puff into the lungs in the morning and at bedtime.   furosemide 20 MG tablet Commonly known as: LASIX Take 1 tablet (20 mg total) by mouth daily. As needed   GAMMA AMINOBUTYRIC ACID PO Take 750 mg by mouth daily. GABA   hydroxypropyl methylcellulose / hypromellose 2.5 % ophthalmic solution Commonly known as: ISOPTO TEARS / GONIOVISC Place 1-2 drops into both eyes 3 (three) times daily as needed for dry eyes ((SCHEDULED EACH MORNING)).   Melatonin 3 MG Tbdp Take 3 mg by mouth at bedtime.   psyllium 58.6 % packet Commonly known as: METAMUCIL Take 1 packet by mouth daily.   tadalafil 5 MG tablet Commonly known as: Cialis Take 1 tablet (5 mg total) by mouth daily as needed for erectile dysfunction.   tamsulosin 0.4 MG Caps capsule Commonly known as: FLOMAX Take 0.4 mg by mouth daily.   traMADol 50 MG tablet Commonly known as: ULTRAM Take 50 mg by mouth every 6 (six) hours as needed.   Xarelto 20 MG Tabs tablet Generic drug: rivaroxaban TAKE ONE-HALF TABLET BY  MOUTH DAILY        Allergies:  Allergies  Allergen Reactions   Allopurinol Other (See Comments)    PATIENT PREFERENCE Pt refused due to mom developing steven johnson syndrome.   Penicillins Hives    Has patient had a PCN reaction causing immediate rash, facial/tongue/throat swelling, SOB or lightheadedness with hypotension: Unknown Has patient had a PCN reaction causing severe rash involving mucus membranes or skin necrosis:Unknown Has patient had a PCN reaction that required hospitalization: No Has  patient had a PCN reaction occurring within the last 10 years: No If all of the above answers are "NO", then may proceed with Cephalosporin use.    Sulfamethoxazole Hives   Sulfonamide Derivatives Hives   Dilaudid [Hydromorphone Hcl] Nausea And Vomiting    Past Medical History, Surgical history, Social history, and Family History were reviewed and updated.  Review of Systems: Review of Systems  Constitutional: Negative.   HENT: Negative.    Eyes: Negative.   Respiratory: Negative.    Cardiovascular: Negative.  Gastrointestinal: Negative.   Genitourinary: Negative.   Musculoskeletal: Negative.   Skin: Negative.   Neurological: Negative.   Endo/Heme/Allergies: Negative.   Psychiatric/Behavioral: Negative.      Physical Exam:  weight is 247 lb 12.8 oz (112.4 kg). His oral temperature is 98.2 F (36.8 C). His blood pressure is 138/85 and his pulse is 71. His respiration is 20 and oxygen saturation is 96%.   Wt Readings from Last 3 Encounters:  04/16/21 247 lb 12.8 oz (112.4 kg)  02/20/21 250 lb (113.4 kg)  01/30/21 250 lb 12.8 oz (113.8 kg)    Physical Exam Vitals reviewed.  HENT:     Head: Normocephalic and atraumatic.  Eyes:     Pupils: Pupils are equal, round, and reactive to light.  Cardiovascular:     Rate and Rhythm: Normal rate and regular rhythm.     Heart sounds: Normal heart sounds.  Pulmonary:     Effort: Pulmonary effort is normal.     Breath sounds: Normal breath sounds.  Abdominal:     General: Bowel sounds are normal.     Palpations: Abdomen is soft.  Musculoskeletal:        General: No tenderness or deformity. Normal range of motion.     Cervical back: Normal range of motion.  Lymphadenopathy:     Cervical: No cervical adenopathy.  Skin:    General: Skin is warm and dry.     Findings: No erythema or rash.  Neurological:     Mental Status: He is alert and oriented to person, place, and time.  Psychiatric:        Behavior: Behavior normal.         Thought Content: Thought content normal.        Judgment: Judgment normal.     Lab Results  Component Value Date   WBC 5.8 04/16/2021   HGB 15.0 04/16/2021   HCT 44.4 04/16/2021   MCV 94.1 04/16/2021   PLT 177 04/16/2021   Lab Results  Component Value Date   FERRITIN 116 03/31/2017   IRON 101 03/31/2017   TIBC 286 03/31/2017   UIBC 185 03/31/2017   IRONPCTSAT 35 03/31/2017   Lab Results  Component Value Date   RBC 4.72 04/16/2021   No results found for: KPAFRELGTCHN, LAMBDASER, KAPLAMBRATIO No results found for: IGGSERUM, IGA, IGMSERUM No results found for: Odetta Pink, SPEI   Chemistry      Component Value Date/Time   NA 141 04/16/2021 1436   NA 145 06/29/2017 1452   K 4.4 04/16/2021 1436   K 4.7 06/29/2017 1452   CL 110 04/16/2021 1436   CL 107 06/29/2017 1452   CO2 24 04/16/2021 1436   CO2 25 06/29/2017 1452   BUN 17 04/16/2021 1436   BUN 33 (H) 06/29/2017 1452   CREATININE 1.87 (H) 04/16/2021 1436   CREATININE 1.56 (H) 06/04/2020 0950      Component Value Date/Time   CALCIUM 9.3 04/16/2021 1436   CALCIUM 9.5 06/29/2017 1452   ALKPHOS 72 04/16/2021 1436   ALKPHOS 83 06/29/2017 1452   AST 20 04/16/2021 1436   ALT 16 04/16/2021 1436   ALT 23 06/29/2017 1452   BILITOT 0.5 04/16/2021 1436       Impression and Plan: Mr. 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   He is  on Xarelto.  Also on baby aspirin.  I think the 2 together is certainly a good combination for him.  I am so happy that things are going pretty well for him.  His renal function is about the same.  We will not make any changes with his Xarelto/aspirin protocol.  I do not think we have to have any Dopplers on his legs.  I will plan to see him back in the spring of next year.  I think  we start moving his appointments out a little bit further.   Volanda Napoleon, MD 10/13/20224:01 PM

## 2021-04-16 NOTE — Addendum Note (Signed)
Addended by: [name] R on: 04/16/2021 04:24 PM   Modules accepted: Orders

## 2021-04-17 ENCOUNTER — Ambulatory Visit (INDEPENDENT_AMBULATORY_CARE_PROVIDER_SITE_OTHER): Payer: Medicare Other | Admitting: *Deleted

## 2021-04-17 DIAGNOSIS — E538 Deficiency of other specified B group vitamins: Secondary | ICD-10-CM | POA: Diagnosis not present

## 2021-04-17 MED ORDER — CYANOCOBALAMIN 1000 MCG/ML IJ SOLN
1000.0000 ug | Freq: Once | INTRAMUSCULAR | Status: AC
  Administered 2021-04-17: 1000 ug via INTRAMUSCULAR

## 2021-04-17 NOTE — Progress Notes (Signed)
Per orders of Dr. Fry, injection of Cyanocobalamin 1000mcg given by Branon Sabine A. Patient tolerated injection well.  

## 2021-04-20 DIAGNOSIS — D225 Melanocytic nevi of trunk: Secondary | ICD-10-CM | POA: Diagnosis not present

## 2021-04-20 DIAGNOSIS — Z08 Encounter for follow-up examination after completed treatment for malignant neoplasm: Secondary | ICD-10-CM | POA: Diagnosis not present

## 2021-04-20 DIAGNOSIS — L814 Other melanin hyperpigmentation: Secondary | ICD-10-CM | POA: Diagnosis not present

## 2021-04-20 DIAGNOSIS — L819 Disorder of pigmentation, unspecified: Secondary | ICD-10-CM | POA: Diagnosis not present

## 2021-04-20 DIAGNOSIS — Z85828 Personal history of other malignant neoplasm of skin: Secondary | ICD-10-CM | POA: Diagnosis not present

## 2021-04-20 DIAGNOSIS — I8393 Asymptomatic varicose veins of bilateral lower extremities: Secondary | ICD-10-CM | POA: Diagnosis not present

## 2021-04-20 DIAGNOSIS — L821 Other seborrheic keratosis: Secondary | ICD-10-CM | POA: Diagnosis not present

## 2021-04-21 ENCOUNTER — Ambulatory Visit (INDEPENDENT_AMBULATORY_CARE_PROVIDER_SITE_OTHER): Payer: Medicare Other

## 2021-04-21 ENCOUNTER — Other Ambulatory Visit: Payer: Self-pay

## 2021-04-21 ENCOUNTER — Other Ambulatory Visit: Payer: Self-pay | Admitting: Family Medicine

## 2021-04-21 DIAGNOSIS — E538 Deficiency of other specified B group vitamins: Secondary | ICD-10-CM

## 2021-04-21 DIAGNOSIS — Z23 Encounter for immunization: Secondary | ICD-10-CM | POA: Diagnosis not present

## 2021-04-21 NOTE — Telephone Encounter (Signed)
pT lov WAS 01/29/2021 Last refill done on 05/28/2020 Please advise

## 2021-04-21 NOTE — Progress Notes (Signed)
Pt here for weekly B12 injection #12 of 12 per Dr Sarajane Jews.  B12 1075mcg given IM left deltoid and pt tolerated injection well.  Pt aware to return for labs after 04/28/21. Lab order placed, pt states he would like to call back to make appt that fits calendar needs.

## 2021-04-22 ENCOUNTER — Ambulatory Visit: Payer: Medicare Other

## 2021-04-29 ENCOUNTER — Other Ambulatory Visit (INDEPENDENT_AMBULATORY_CARE_PROVIDER_SITE_OTHER): Payer: Medicare Other

## 2021-04-29 ENCOUNTER — Other Ambulatory Visit: Payer: Self-pay

## 2021-04-29 DIAGNOSIS — E538 Deficiency of other specified B group vitamins: Secondary | ICD-10-CM

## 2021-04-29 LAB — VITAMIN B12: Vitamin B-12: 858 pg/mL (ref 211–911)

## 2021-05-04 IMAGING — CR CHEST - 2 VIEW
2 series · 2 of 2 positions shown · non-contrast
Comparison: PA and lateral chest 03/10/2013.

CLINICAL DATA: Preoperative examination in patient for knee
replacement.

EXAM:
CHEST - 2 VIEW

[w chest pa]
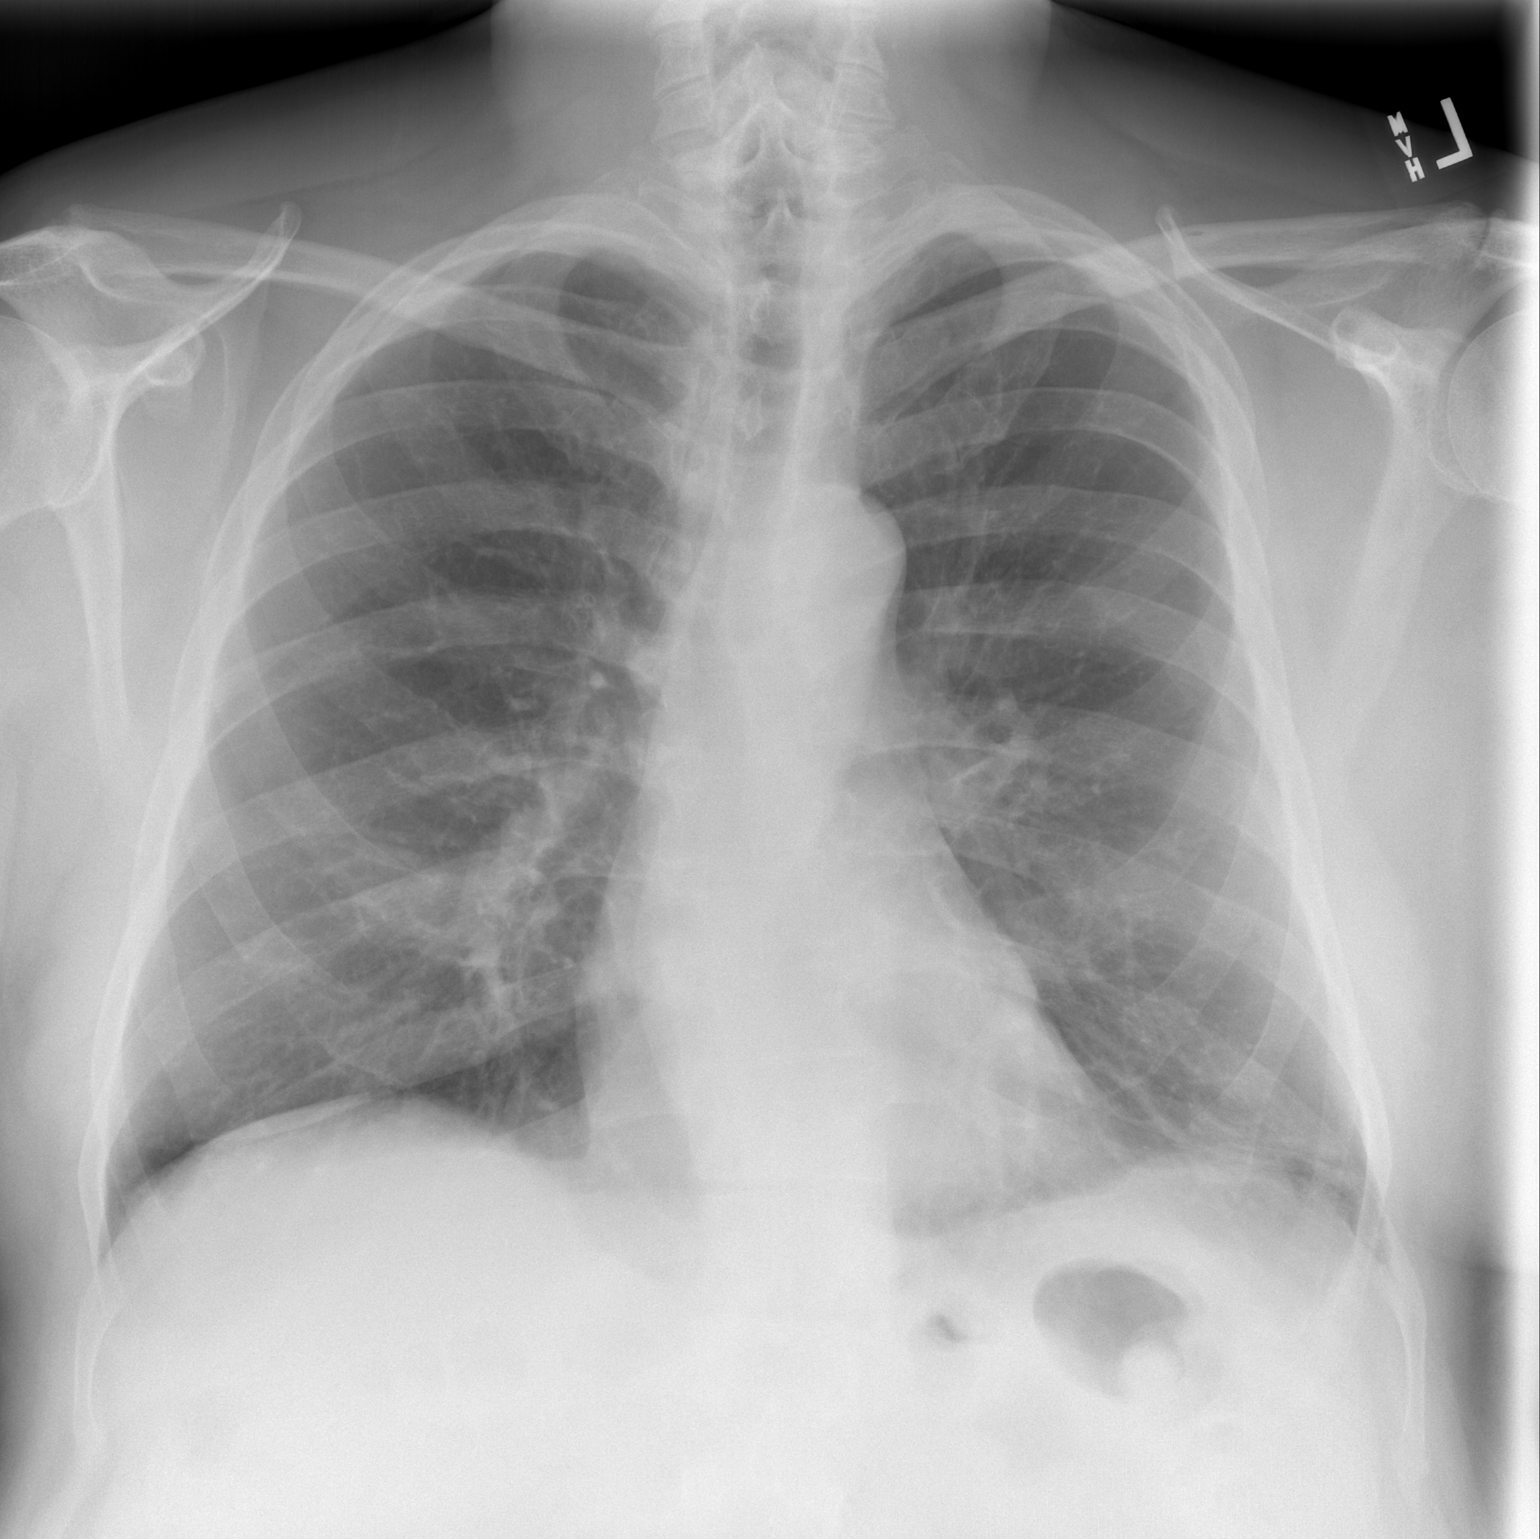

[w chest lat]
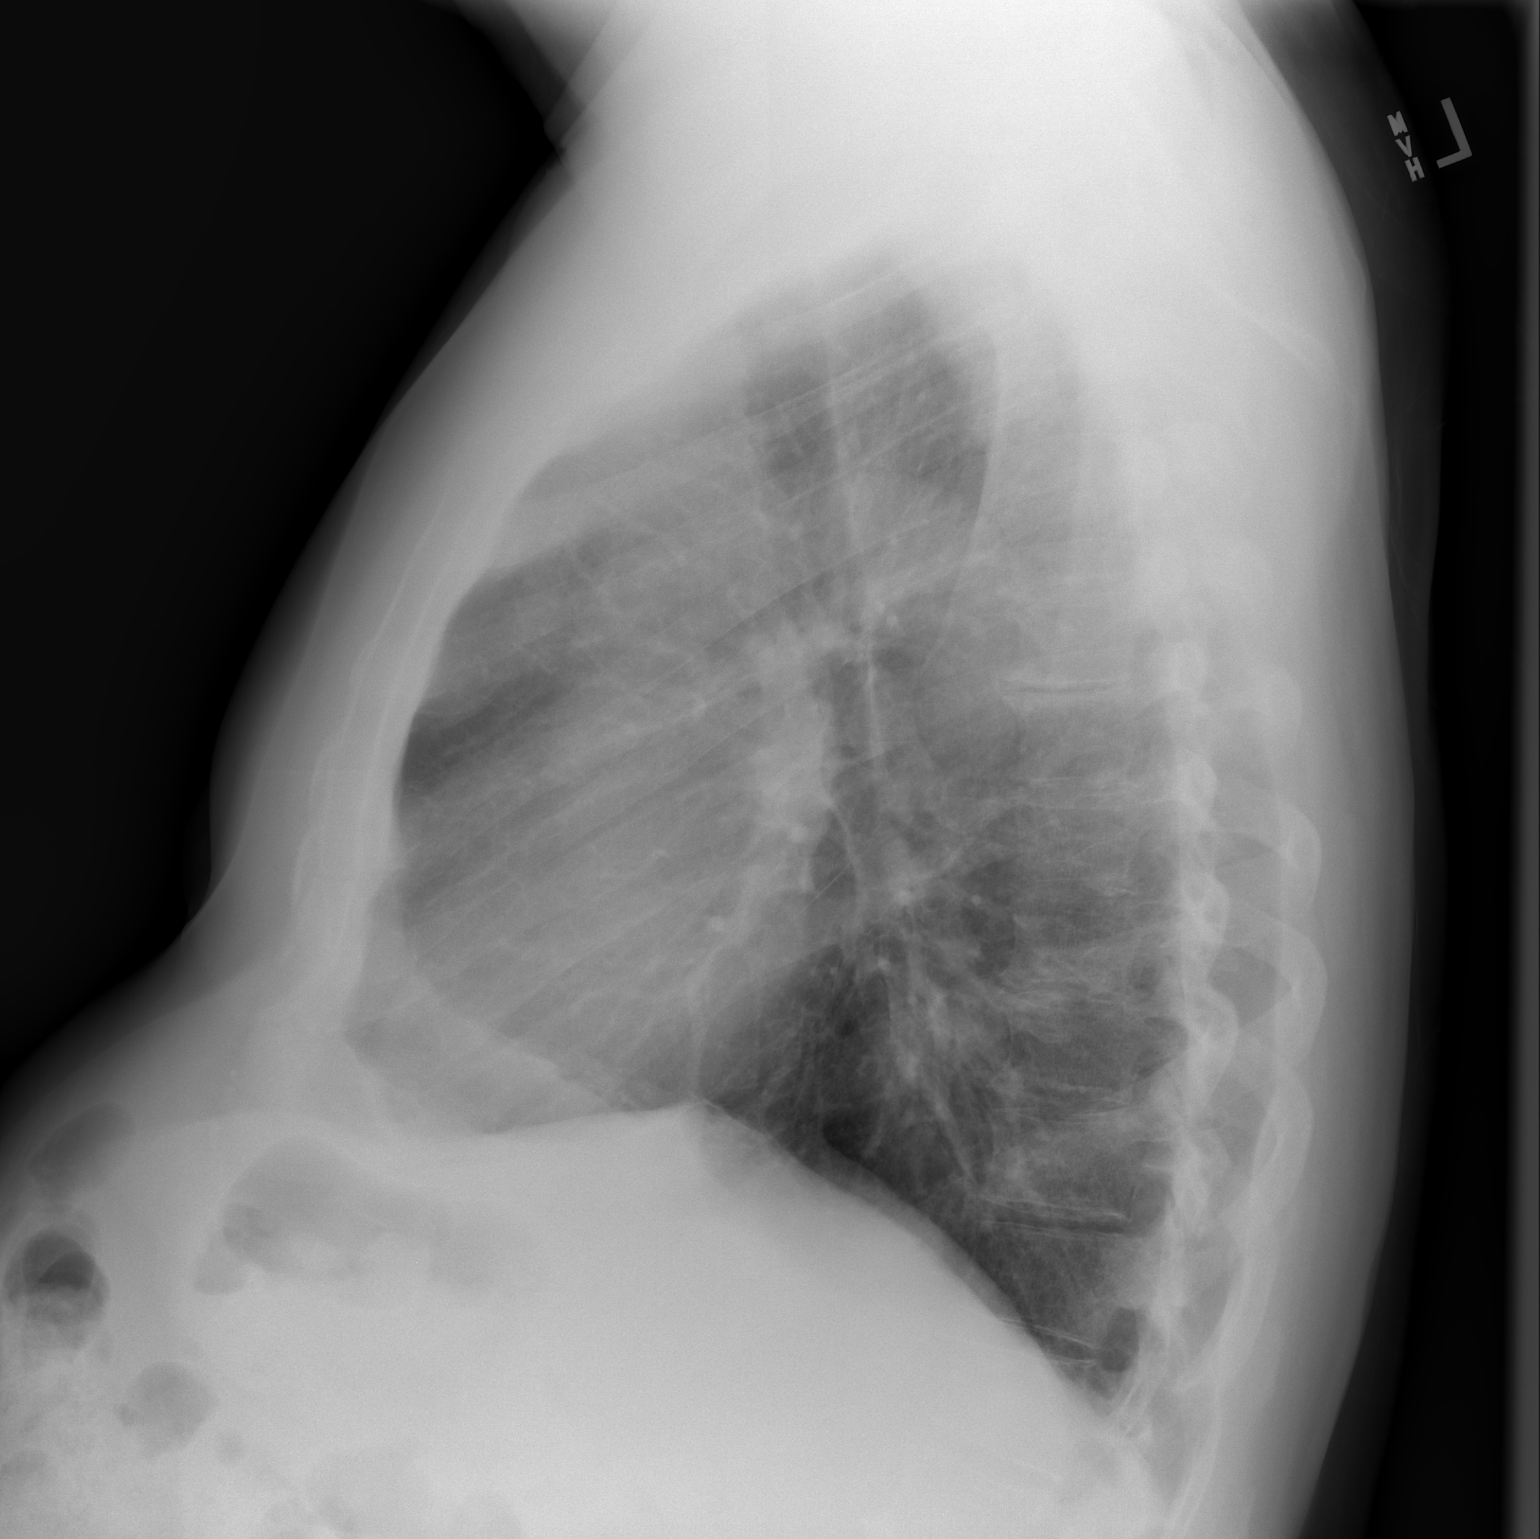

[2 of 2 positions shown; findings below may reference images not displayed]

FINDINGS: Small area of linear scar in the left lung base is unchanged. Lungs
otherwise clear. Heart size normal. No pneumothorax or pleural
effusion. No acute or focal bony abnormality.
IMPRESSION: No acute disease.

## 2021-05-12 ENCOUNTER — Telehealth: Payer: Self-pay | Admitting: Pharmacist

## 2021-05-12 NOTE — Chronic Care Management (AMB) (Signed)
Chronic Care Management Pharmacy Assistant   Name: Joseph Tran  MRN: [id number] DOB: 04/21/1951  Reason for Encounter: Disease State / Hypertension Assessment Call   Conditions to be addressed/monitored: HTN  Recent office visits:  None  Recent consult visits:  None  Hospital visits:  None  Medications: Outpatient Encounter Medications as of 05/12/2021  Medication Sig   acetaminophen (TYLENOL) 500 MG tablet Take 1,000 mg by mouth as needed.    albuterol (PROAIR HFA) 108 (90 Base) MCG/ACT inhaler INHALE 2 PUFFS EVERY 4  HOURS AS NEEDED FOR  WHEEZING OR SHORTNESS OF  BREATH (Patient not taking: Reported on 04/16/2021)   APPLE CIDER VINEGAR PO Take by mouth. Takes 1 1/2 tbsp daily in cherry juice.   aspirin EC 81 MG tablet Take 162 mg by mouth daily.   bisacodyl (DULCOLAX) 5 MG EC tablet Take 5 mg by mouth at bedtime.   buPROPion (WELLBUTRIN XL) 300 MG 24 hr tablet TAKE 1 TABLET BY MOUTH  DAILY   Cholecalciferol 50 MCG (2000 UT) TABS Take by mouth.   clindamycin (CLEOCIN) 300 MG capsule TAKE 2 CAPSULES BY MOUTH 1 HOUR BEFORE DENTAL APPOINTMENT (Patient not taking: Reported on 04/16/2021)   clonazePAM (KLONOPIN) 1 MG tablet TAKE 1 TABLET BY MOUTH  TWICE DAILY AS NEEDED FOR  ANXIETY   desvenlafaxine (PRISTIQ) 50 MG 24 hr tablet TAKE 1 TABLET BY MOUTH  DAILY (Patient taking differently: daily.)   diltiazem (CARDIZEM CD) 240 MG 24 hr capsule Take 240 mg by mouth daily.   diltiazem 2 % GEL Apply 1 application topically 2 (two) times daily. prn   docusate sodium (COLACE) 100 MG capsule Take 100 mg by mouth daily.   finasteride (PROSCAR) 5 MG tablet Take 5 mg by mouth every morning.    Fluticasone-Salmeterol (ADVAIR DISKUS) 250-50 MCG/DOSE AEPB Inhale 1 puff into the lungs in the morning and at bedtime. (Patient not taking: Reported on 04/16/2021)   furosemide (LASIX) 20 MG tablet Take 1 tablet (20 mg total) by mouth daily. As needed   GAMMA AMINOBUTYRIC ACID PO Take 750 mg by  mouth daily. GABA   hydroxypropyl methylcellulose / hypromellose (ISOPTO TEARS / GONIOVISC) 2.5 % ophthalmic solution Place 1-2 drops into both eyes 3 (three) times daily as needed for dry eyes ((SCHEDULED EACH MORNING)).   Melatonin 3 MG TBDP Take 3 mg by mouth at bedtime.   psyllium (METAMUCIL) 58.6 % packet Take 1 packet by mouth daily.   tadalafil (CIALIS) 5 MG tablet Take 1 tablet (5 mg total) by mouth daily as needed for erectile dysfunction.   tamsulosin (FLOMAX) 0.4 MG CAPS capsule Take 0.4 mg by mouth daily.   traMADol (ULTRAM) 50 MG tablet Take 50 mg by mouth every 6 (six) hours as needed. (Patient not taking: Reported on 04/16/2021)   XARELTO 20 MG TABS tablet TAKE ONE-HALF TABLET BY  MOUTH DAILY   No facility-administered encounter medications on file as of 05/12/2021.   Fill History: DESVENLAFAXINE ER  50 MG TB24 03/15/2021 90   FINASTERIDE  5 MG TABS 03/20/2021 90   FUROSEMIDE  20 MG TABS 04/16/2021 90   XARELTO  20 MG TABS 05/04/2021 60   TAMSULOSIN HYDROCHLORIDE  0.4 MG CAPS 04/14/2021 90   BUPROPION HYDROCHLORIDE ER (XL)  300 MG TB24 03/15/2021 90   CLONAZEPAM  1 MG TABS 04/21/2021 90   DILTIAZEM HYDROCHLORIDE ER  120 MG CP24 04/21/2021 90   Reviewed chart prior to disease state call. Spoke with patient  regarding BP  Recent Office Vitals: BP Readings from Last 3 Encounters:  04/16/21 138/85  02/20/21 122/68  01/30/21 132/78   Pulse Readings from Last 3 Encounters:  04/16/21 71  02/20/21 68  01/30/21 74    Wt Readings from Last 3 Encounters:  04/16/21 247 lb 12.8 oz (112.4 kg)  02/20/21 250 lb (113.4 kg)  01/30/21 250 lb 12.8 oz (113.8 kg)     Kidney Function Lab Results  Component Value Date/Time   CREATININE 1.87 (H) 04/16/2021 02:36 PM   CREATININE 1.67 (H) 12/09/2020 08:59 AM   CREATININE 1.56 (H) 06/04/2020 09:50 AM   CREATININE 2.1 (H) 06/29/2017 02:52 PM   GFR 43.84 (L) 05/30/2019 09:03 AM   GFRNONAA 38 (L) 04/16/2021 02:36 PM   GFRNONAA  45 (L) 06/04/2020 09:50 AM   GFRAA 52 (L) 06/04/2020 09:50 AM    BMP Latest Ref Rng & Units 04/16/2021 12/09/2020 09/04/2020  Glucose 70 - 99 mg/dL 102(H) 118(H) 119(H)  BUN 8 - 23 mg/dL 17 19 27(H)  Creatinine 0.61 - 1.24 mg/dL 1.87(H) 1.67(H) 1.80(H)  BUN/Creat Ratio 6 - 22 (calc) - - -  Sodium 135 - 145 mmol/L 141 141 142  Potassium 3.5 - 5.1 mmol/L 4.4 3.5 4.4  Chloride 98 - 111 mmol/L 110 110 110  CO2 22 - 32 mmol/L 24 22 26   Calcium 8.9 - 10.3 mg/dL 9.3 9.0 9.7    Current antihypertensive regimen:  Diltiazem 240 mg daily  Any recent hospitalizations or ED visits since last visit with CPP? No  Adherence Review: Is the patient currently on ACE/ARB medication? No Does the patient have >5 day gap between last estimated fill dates? No  Unable to reach patient after several attempts  Care Gaps: AWV - completed 02/20/2021 Last BP -  Hepatitis C Screen - never done Pnuemonia vaccine - overdue  Star Rating Drugs: None  [place]  Clinical Pharmacist Assistant [phone]

## 2021-05-16 ENCOUNTER — Other Ambulatory Visit: Payer: Self-pay | Admitting: Family Medicine

## 2021-06-01 ENCOUNTER — Ambulatory Visit (INDEPENDENT_AMBULATORY_CARE_PROVIDER_SITE_OTHER): Payer: Medicare Other

## 2021-06-01 DIAGNOSIS — E538 Deficiency of other specified B group vitamins: Secondary | ICD-10-CM | POA: Diagnosis not present

## 2021-06-01 MED ORDER — CYANOCOBALAMIN 1000 MCG/ML IJ SOLN
1000.0000 ug | Freq: Once | INTRAMUSCULAR | Status: AC
  Administered 2021-06-01: 10:00:00 1000 ug via INTRAMUSCULAR

## 2021-06-01 NOTE — Progress Notes (Signed)
Per orders of Dr. Fry, injection of Cyanocobalamin 1,000 mcg/mL given by Kebin Maye N Izzac Rockett. Patient tolerated injection well.  

## 2021-06-09 ENCOUNTER — Encounter: Payer: Medicare Other | Admitting: Family Medicine

## 2021-06-11 DIAGNOSIS — N138 Other obstructive and reflux uropathy: Secondary | ICD-10-CM | POA: Diagnosis not present

## 2021-06-11 DIAGNOSIS — Z125 Encounter for screening for malignant neoplasm of prostate: Secondary | ICD-10-CM | POA: Diagnosis not present

## 2021-06-11 DIAGNOSIS — N1832 Chronic kidney disease, stage 3b: Secondary | ICD-10-CM | POA: Diagnosis not present

## 2021-06-11 DIAGNOSIS — N529 Male erectile dysfunction, unspecified: Secondary | ICD-10-CM | POA: Diagnosis not present

## 2021-06-11 DIAGNOSIS — N401 Enlarged prostate with lower urinary tract symptoms: Secondary | ICD-10-CM | POA: Diagnosis not present

## 2021-06-11 DIAGNOSIS — Z87448 Personal history of other diseases of urinary system: Secondary | ICD-10-CM | POA: Diagnosis not present

## 2021-06-17 ENCOUNTER — Other Ambulatory Visit: Payer: Self-pay | Admitting: Hematology & Oncology

## 2021-06-18 ENCOUNTER — Encounter: Payer: Medicare Other | Admitting: Family Medicine

## 2021-06-18 DIAGNOSIS — N138 Other obstructive and reflux uropathy: Secondary | ICD-10-CM | POA: Diagnosis not present

## 2021-06-18 DIAGNOSIS — N401 Enlarged prostate with lower urinary tract symptoms: Secondary | ICD-10-CM | POA: Diagnosis not present

## 2021-06-18 DIAGNOSIS — Z125 Encounter for screening for malignant neoplasm of prostate: Secondary | ICD-10-CM | POA: Diagnosis not present

## 2021-06-26 ENCOUNTER — Encounter: Payer: Medicare Other | Admitting: Family Medicine

## 2021-07-01 ENCOUNTER — Ambulatory Visit: Payer: Medicare Other

## 2021-07-01 DIAGNOSIS — Z20822 Contact with and (suspected) exposure to covid-19: Secondary | ICD-10-CM | POA: Diagnosis not present

## 2021-07-03 ENCOUNTER — Other Ambulatory Visit: Payer: Self-pay

## 2021-07-03 ENCOUNTER — Telehealth: Payer: Self-pay | Admitting: Family Medicine

## 2021-07-03 MED ORDER — AZITHROMYCIN 250 MG PO TABS: ORAL_TABLET | ORAL | 0 refills | Status: AC

## 2021-07-03 NOTE — Telephone Encounter (Signed)
Spoke with patient, message given.    Zpack sent to [facility] on [address].

## 2021-07-03 NOTE — Telephone Encounter (Signed)
It is too late for him to take an antiviral medication since he has had this for over 5 days. However to keep him from developing a secondary infection please call in a Zpack. Follow up as needed.

## 2021-07-03 NOTE — Telephone Encounter (Signed)
Patient has tested for COVID on 07/01/2021 and is positive. Patient states tha his temp is normal (97.1) patient also states that his BP is 146/87. Over all he feels fine however the congestion in his chest, head, and nose is not getting any better. Would like Dr. Sarajane Jews to call him and let him know what he can do.  States that he has not had a fever since 06/24/2021.  Pt also states the best number to reach him is his cell number

## 2021-07-09 ENCOUNTER — Ambulatory Visit (INDEPENDENT_AMBULATORY_CARE_PROVIDER_SITE_OTHER): Payer: Medicare Other

## 2021-07-09 DIAGNOSIS — E538 Deficiency of other specified B group vitamins: Secondary | ICD-10-CM

## 2021-07-09 MED ORDER — CYANOCOBALAMIN 1000 MCG/ML IJ SOLN
1000.0000 ug | Freq: Once | INTRAMUSCULAR | Status: AC
  Administered 2021-07-09: 1000 ug via INTRAMUSCULAR

## 2021-07-09 NOTE — Progress Notes (Signed)
Per orders of [name], MD, injection of B12 given in left deltoid by Sreeja Spies D Cobin Cadavid. Patient tolerated injection well.  Lab Results  Component Value Date   [id number] 259 04/29/2021

## 2021-07-14 ENCOUNTER — Telehealth: Payer: Self-pay | Admitting: Pharmacist

## 2021-07-14 NOTE — Chronic Care Management (AMB) (Signed)
° ° °  Chronic Care Management Pharmacy Assistant   Name: Joseph Tran  MRN: [id number] DOB: 20-Aug-1950  07/07/2021 APPOINTMENT REMINDER   Called [facility], No answer, left message of appointment on 07/17/2021 at 1:00 via telephone visit with Jeni Salles, Pharm D. Notified to have all medications, supplements, blood pressure and/or blood sugar logs available during appointment and to return call if need to reschedule.  Care Gaps: AWV - completed 02/20/2021 Last BP - 138/85 on 04/16/2021 Hepatitis C Screen - never done Pnuemonia vaccine - overdue  Star Rating Drug: None  Any gaps in medications fill history? No  Gennie Alma [facility]  Catering manager [phone]

## 2021-07-17 ENCOUNTER — Ambulatory Visit (INDEPENDENT_AMBULATORY_CARE_PROVIDER_SITE_OTHER): Payer: Medicare Other | Admitting: Pharmacist

## 2021-07-17 DIAGNOSIS — N401 Enlarged prostate with lower urinary tract symptoms: Secondary | ICD-10-CM

## 2021-07-17 DIAGNOSIS — N138 Other obstructive and reflux uropathy: Secondary | ICD-10-CM

## 2021-07-17 DIAGNOSIS — F418 Other specified anxiety disorders: Secondary | ICD-10-CM

## 2021-07-17 NOTE — Progress Notes (Signed)
Chronic Care Management Pharmacy Note  07/17/2021 Name:  Joseph Tran MRN:  [id number] DOB:  1950/07/14  Summary: Pt reports his anxiety has improved but confused to have nightmares BP has improved since increase in diltiazem   Recommendations/Changes made from today's visit: -Recommended scheduling a visit with behavioral health to help with anxiety and provided telephone number -Consider prazosin for nightmares to replace the tamsulosin -Consider switching Advair to PRN Symbicort for asthma controller and reliever    Plan: Follow up BP assessment in 3 months  Subjective: Joseph Tran is an 71 y.o. year old male who is a primary patient of Joseph Morale, MD.  The CCM team was consulted for assistance with disease management and care coordination needs.    Engaged with patient by telephone for follow up visit in response to provider referral for pharmacy case management and/or care coordination services.   Consent to Services:  The patient was given information about Chronic Care Management services, agreed to services, and gave verbal consent prior to initiation of services.  Please see initial visit note for detailed documentation.   Patient Care Team: Joseph Morale, MD as PCP - Joseph Tran, Joseph Cobb, MD as Consulting Physician (Oncology) Joseph Tran, Joseph Tran as Pharmacist (Pharmacist)  Recent office visits: 07/09/21 Joseph Tran, CMA: Patient presented for vitamin B12 injection.  02/20/21 Joseph Pigg, LPN: Patient presented for AWV.  01/29/21 Joseph Penna, MD: Patient presented for neuropathy. Vitamin B12 was low. Recommended weekly injections x 12 weeks and repeat level after this.  Recent consult visits: 06/11/21 Patient presented for ultrasound imaging of prostate.  04/16/21 [name], MD (hem/oncology): Patient presented for DVT and CKD follow up.  02/09/21 [name], MD (nephrology): Patient presented for follow up. Decreased cardizem to 120 mg and  prescribed Hytrin 1 mg to replace Flomax.  01/30/21 Sherrilyn Rist, MD (pulmonary): Patient presented for OSA on CPAP follow up. Sent order to DME for CPAP supplies.  Hospital visits: None in previous 6 months  Objective:  Lab Results  Component Value Date   CREATININE 1.87 (H) 04/16/2021   BUN 17 04/16/2021   GFR 43.84 (L) 05/30/2019   GFRNONAA 38 (L) 04/16/2021   GFRAA 52 (L) 06/04/2020   NA 141 04/16/2021   K 4.4 04/16/2021   CALCIUM 9.3 04/16/2021   CO2 24 04/16/2021   GLUCOSE 102 (H) 04/16/2021    Lab Results  Component Value Date/Time   HGBA1C 5.2 01/29/2021 11:25 AM   HGBA1C 4.9 06/04/2020 09:50 AM   GFR 43.84 (L) 05/30/2019 09:03 AM   GFR 38.80 (L) 09/20/2017 08:10 AM    Last diabetic Eye exam: No results found for: HMDIABEYEEXA  Last diabetic Foot exam: No results found for: HMDIABFOOTEX   Lab Results  Component Value Date   CHOL 179 06/04/2020   HDL 43 06/04/2020   LDLCALC 105 (H) 06/04/2020   TRIG 191 (H) 06/04/2020   CHOLHDL 4.2 06/04/2020    Hepatic Function Latest Ref Rng & Units 04/16/2021 12/09/2020 09/04/2020  Total Protein 6.5 - 8.1 g/dL 6.6 6.0(L) 6.5  Albumin 3.5 - 5.0 g/dL 3.8 3.9 4.2  AST 15 - 41 U/L _0 ALT 0 - 44 U/L _1 Alk Phosphatase 38 - 126 U/L 72 69 65  Total Bilirubin 0.3 - 1.2 mg/dL 0.5 0.6 0.6  Bilirubin, Direct 0.0 - 0.2 mg/dL - - -    Lab Results  Component Value Date/Time   TSH 1.40 01/29/2021 11:25  AM   TSH 2.15 06/04/2020 09:50 AM   FREET4 0.69 01/29/2021 11:25 AM    CBC Latest Ref Rng & Units 04/16/2021 12/09/2020 09/04/2020  WBC 4.0 - 10.5 K/uL 5.8 5.8 5.6  Hemoglobin 13.0 - 17.0 g/dL 15.0 14.6 15.0  Hematocrit 39.0 - 52.0 % 44.4 42.5 44.7  Platelets 150 - 400 K/uL 177 163 197    No results found for: VD25OH  Clinical ASCVD: No  The 10-year ASCVD risk score (Arnett DK, et al., 2019) is: 22.1%   Values used to calculate the score:     Age: 71 years     Sex: Male     Is Non-Hispanic African American:  No     Diabetic: No     Tobacco smoker: No     Systolic Blood Pressure: 528 mmHg     Is BP treated: Yes     HDL Cholesterol: 43 mg/dL     Total Cholesterol: 179 mg/dL    Depression screen [facility] 2/9 02/20/2021 02/20/2021 06/26/2020  Decreased Interest 0 0 0  Down, Depressed, Hopeless 0 0 0  PHQ - 2 Score 0 0 0  Altered sleeping - - -  Tired, decreased energy - - -  Change in appetite - - -  Feeling bad or failure about yourself  - - -  Trouble concentrating - - -  Moving slowly or fidgety/restless - - -  Suicidal thoughts - - -  PHQ-9 Score - - -  Difficult doing work/chores - - -      Social History   Tobacco Use  Smoking Status Never  Smokeless Tobacco Never  Tobacco Comments   NEVER USED TOBACCO   BP Readings from Last 3 Encounters:  04/16/21 138/85  02/20/21 122/68  01/30/21 132/78   Pulse Readings from Last 3 Encounters:  04/16/21 71  02/20/21 68  01/30/21 74   Wt Readings from Last 3 Encounters:  04/16/21 247 lb 12.8 oz (112.4 kg)  02/20/21 250 lb (113.4 kg)  01/30/21 250 lb 12.8 oz (113.8 kg)   BMI Readings from Last 3 Encounters:  04/16/21 33.61 kg/m  02/20/21 33.91 kg/m  01/30/21 34.01 kg/m    Assessment/Interventions: Review of patient past medical history, allergies, medications, health status, including review of consultants reports, laboratory and other test data, was performed as part of comprehensive evaluation and provision of chronic care management services.   SDOH:  (Social Determinants of Health) assessments and interventions performed: Yes  SDOH Screenings   Alcohol Screen: Low Risk    Last Alcohol Screening Score (AUDIT): 0  Depression (PHQ2-9): Low Risk    PHQ-2 Score: 0  Financial Resource Strain: Not on file  Food Insecurity: No Food Insecurity   Worried About Charity fundraiser in the Last Year: Never true   Ran Out of Food in the Last Year: Never true  Housing: Low Risk    Last Housing Risk Score: 0  Physical Activity:  Sufficiently Active   Days of Exercise per Week: 5 days   Minutes of Exercise per Session: 60 min  Social Connections: Socially Isolated   Frequency of Communication with Friends and Family: Three times a week   Frequency of Social Gatherings with Friends and Family: Three times a week   Attends Religious Services: Never   Active Member of Clubs or Organizations: No   Attends Archivist Meetings: Never   Marital Status: Never married  Stress: No Stress Concern Present   Feeling of Stress : Not  at all  Tobacco Use: Low Risk    Smoking Tobacco Use: Never   Smokeless Tobacco Use: Never   Passive Exposure: Not on file  Transportation Needs: No Transportation Needs   Lack of Transportation (Medical): No   Lack of Transportation (Non-Medical): No    CCM Care Plan  Allergies  Allergen Reactions   Allopurinol Other (See Comments)    PATIENT PREFERENCE Pt refused due to mom developing steven johnson syndrome.   Penicillins Hives    Has patient had a PCN reaction causing immediate rash, facial/tongue/throat swelling, SOB or lightheadedness with hypotension: Unknown Has patient had a PCN reaction causing severe rash involving mucus membranes or skin necrosis:Unknown Has patient had a PCN reaction that required hospitalization: No Has patient had a PCN reaction occurring within the last 10 years: No If all of the above answers are "NO", then may proceed with Cephalosporin use.    Sulfamethoxazole Hives   Sulfonamide Derivatives Hives   Dilaudid [Hydromorphone Hcl] Nausea And Vomiting    Medications Reviewed Today     Reviewed by Joseph Tran, [facility] (Pharmacist) on 03/20/21 at 1323  Med List Status: <None>   Medication Order Taking? Sig Documenting Provider Last Dose Status Informant  acetaminophen (TYLENOL) 500 MG tablet [id number]  Take 1,000 mg by mouth as needed.  [provider]  Active   albuterol (PROAIR HFA) 108 (90 Base) MCG/ACT inhaler [id number]  INHALE  2 PUFFS EVERY 4  HOURS AS NEEDED FOR  WHEEZING OR SHORTNESS OF  BREATH  Patient taking differently: INHALE 2 PUFFS EVERY 4  HOURS AS NEEDED FOR  WHEEZING OR SHORTNESS OF  Damian Leavell, MD  Active   aspirin EC 81 MG tablet [id number]  Take 162 mg by mouth daily. [provider]  Active Self           Med Note ([place], PATTI E   Wed Feb 27, 2020  8:43 AM)    bisacodyl (DULCOLAX) 5 MG EC tablet [id number]  Take 5 mg by mouth at bedtime. [provider]  Active Self  buPROPion (WELLBUTRIN XL) 300 MG 24 hr tablet [id number] Yes TAKE 1 TABLET BY MOUTH  DAILY Joseph Morale, MD Taking Active   clindamycin (CLEOCIN) 300 MG capsule [id number]  TAKE 2 CAPSULES BY MOUTH 1 HOUR BEFORE DENTAL APPOINTMENT [provider]  Active   clonazePAM (KLONOPIN) 1 MG tablet [id number] Yes TAKE 1 TABLET BY MOUTH  TWICE DAILY AS NEEDED FOR  ANXIETY Joseph Morale, MD Taking Active   desvenlafaxine (PRISTIQ) 50 MG 24 hr tablet [id number] Yes TAKE 1 TABLET BY MOUTH  DAILY  Patient taking differently: daily.   Joseph Morale, MD Taking Active            Med Note [facility], Jiles Goya G   Fri Mar 20, 2021  1:18 PM)    diltiazem [facility]) 120 MG 24 hr capsule [id number] Yes Take 120 mg by mouth daily. [provider] Taking Active   diltiazem 2 % GEL [id number]  Apply 1 application topically 2 (two) times daily. prn [provider]  Active   docusate sodium (COLACE) 100 MG capsule [id number]  Take 100 mg by mouth daily. [provider]  Active   finasteride (PROSCAR) 5 MG tablet [id number]  Take 5 mg by mouth every morning.  [provider]  Active Self  Fluticasone-Salmeterol (ADVAIR DISKUS) 250-50 MCG/DOSE AEPB [id number]  Inhale 1 puff into the lungs in the morning and at  bedtime. Laurin Coder, MD  Active   furosemide (LASIX) 20 MG tablet [id number]  Take 1 tablet (20 mg total) by mouth daily.  Patient taking differently: Take 20 mg by mouth daily. As needed    Volanda Napoleon, MD  Active   GAMMA AMINOBUTYRIC ACID PO [id number]  Take 750 mg by mouth daily. GABA [provider]  Active Self  hydroxypropyl methylcellulose / hypromellose (ISOPTO TEARS / GONIOVISC) 2.5 % ophthalmic solution [id number]  Place 1-2 drops into both eyes 3 (three) times daily as needed for dry eyes ((SCHEDULED EACH MORNING)). [provider]  Active Self  Melatonin 3 MG TBDP [id number] Yes Take 3 mg by mouth at bedtime. [provider] Taking Active Self  psyllium (METAMUCIL) 58.6 % packet [id number]  Take 1 packet by mouth daily. [provider]  Active   tadalafil (CIALIS) 5 MG tablet [id number]  Take 1 tablet (5 mg total) by mouth daily as needed for erectile dysfunction. Joseph Morale, MD  Active   terazosin (HYTRIN) 1 MG capsule [id number] Yes Take 1 mg by mouth at bedtime. [provider] Taking Active   traMADol (ULTRAM) 50 MG tablet [id number]  Take 50 mg by mouth every 6 (six) hours as needed. [provider]  Active   XARELTO 20 MG TABS tablet [id number]  TAKE ONE-HALF TABLET BY  MOUTH DAILY Ennever, Joseph Cobb, MD  Active   Med List Note Marylynn Pearson 04/19/17 2500): CPAP WITH SLEEP.            Patient Active Problem List   Diagnosis Date Noted   Hyperglycemia 05/30/2019   Primary osteoarthritis of right knee 01/19/2019   CKD (chronic kidney disease), stage III ([place]) 09/13/2017   Renal atrophy, left 09/13/2017   Systemic lupus erythematosus ([place]) 09/13/2017   Dyslipidemia 09/13/2017   Acute meniscal tear, lateral, right, initial encounter 04/25/2017   Osteoarthritis of right knee 04/25/2017   Concussion with loss of consciousness 08/05/2015   Laceration of spleen 08/05/2015   Lumbar stress fracture 08/05/2015   DVT (deep venous thrombosis) ([place]) 10/18/2014   Hx of bacterial pneumonia 03/10/2013   COLONIC POLYPS, HX OF 04/08/2010   NEPHROLITHIASIS, HX OF 04/08/2010   ACOUSTIC NEUROMA 04/07/2010    Hypogonadism male 04/07/2010   Depression with anxiety 04/07/2010   SLEEP APNEA, OBSTRUCTIVE 04/07/2010   Asthma 04/07/2010   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   ERECTILE DYSFUNCTION, 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   PLANTAR FASCIITIS 04/07/2010    Immunization History  Administered Date(s) Administered   Fluad Quad(high Dose 65+) 03/24/2019   Influenza Split 04/02/2011, 03/16/2012   Influenza, High Dose Seasonal PF 03/25/2017, 04/11/2018   Influenza,inj,Quad PF,6+ Mos 03/27/2013, 03/25/2014, 04/22/2015, 04/28/2016   Influenza-Unspecified 04/30/2020, 03/19/2021   PFIZER Comirnaty(Gray Top)Covid-19 Tri-Sucrose Vaccine 11/03/2020   PFIZER(Purple Top)SARS-COV-2 Vaccination 08/19/2019, 09/11/2019, 05/09/2020   Pfizer Covid-19 Vaccine Bivalent Booster 52yr & up 03/31/2021   Pneumococcal Conjugate-13 04/28/2016   Pneumococcal Polysaccharide-23 07/20/2001, 07/20/2005   Tdap 04/02/2011, 08/02/2015   Zoster Recombinat (Shingrix) 08/28/2020, 10/27/2020   Zoster, Live 08/10/2013   He also has concerns about his anxiety getting worse in the morning and sometimes he wakes up crying. Recommended avoiding Benadryl as this has the potential to worsen nightmares. The anxiety "attack" usually occurs around 5 am and they don't occur every night. Patient has only been taking clonazepam a couple times a week and has slowly backed down on his use of that. He does think he might be bipolar.  Patient  had a lot of nasal congestion and coughing with COVID but reports he is doing much better now and only has a little bit of nasal congestion. Patient is unsure of how he got it and he is unsure of how he got it and wears a mask.   Patient follows up with pulmonary but is unsure if they are aware that he stopped the Advair.  Patient reports his anxiety is a little bit better but still has nightmares. He moved the Pristiq to the evening per our last conversation and this seems to have helped some with the anxiety in the  morning. He never called the behavioral health phone number as he is unsure if they will be able to help him at this point. He used to see a psychiatrist for several years but that was mostly for them to prescribe medications.  Conditions to be addressed/monitored:  Hypertension, Asthma, Depression, Anxiety, Osteoarthritis, BPH and DVT, fluid retention, and constipation  Conditions addressed this visit: Hypertension, depression, anxiety, asthma  Care Plan : CCM Pharmacy Care Plan  Updates made by Joseph Tran, [place] since 07/17/2021 12:00 AM     Problem: Problem: Hypertension, Asthma, Depression, Anxiety, Osteoarthritis, BPH and DVT, fluid retention, and constipation      Long-Range Goal: Patient-Specific Goal   Start Date: 10/14/2020  Expected End Date: 10/14/2021  Recent Progress: On track  Priority: High  Note:   Current Barriers:  Unable to independently monitor therapeutic efficacy Unable to achieve control of blood pressure  Unable to maintain control of anxiety/depression  Pharmacist Clinical Goal(s):  Patient will achieve adherence to monitoring guidelines and medication adherence to achieve therapeutic efficacy achieve control of blood pressure as evidenced by home blood pressure readings  through collaboration with PharmD and provider.   Interventions: 1:1 collaboration with Joseph Morale, MD regarding development and update of comprehensive plan of care as evidenced by provider attestation and co-signature Inter-disciplinary care team collaboration (see longitudinal plan of care) Comprehensive medication review performed; medication list updated in electronic medical record  Hypertension (BP goal <130/80) -Controlled -Current treatment: Diltiazem 240 mg 1 capsule daily - appropriate, query effective -Medications previously tried: none  -Current home readings: 132/84, 154/90 (Dec 2nd) (arm cuff; checks once a week at work) - checking 2-3 times a week -Current  dietary habits: staying away from salt (doesn't add to anything and looks at sodium content of everything) and potassium -Current exercise habits: not going to the gym with COVID, active 4-5 hours a day taking -Denies hypotensive/hypertensive symptoms -Educated on Exercise goal of 150 minutes per week; Importance of home blood pressure monitoring; Proper BP monitoring technique; -Counseled to monitor BP at home weekly, document, and provide log at future appointments -Counseled on diet and exercise extensively Recommended to continue current medication  Asthma/sleep apnea (Goal: control symptoms) -Controlled -Current treatment  Advair 250-50 mcg/dose 1 puff twice daily - appropriate, query effective Albuterol 108 mcg/act 1 puff as needed (2 times a week) - query appropriate -Medications previously tried: none  -Exacerbations requiring treatment in last 6 months: none -Patient denies consistent use of maintenance inhaler -Frequency of rescue inhaler use: twice weekly -Counseled on Benefits of consistent maintenance inhaler use Differences between maintenance and rescue inhalers -Recommended to continue current medication Consider switching Advair to Symbicort for PRN and scheduled use.  Depression/Anxiety (Goal: minimize symptoms) -Controlled -Current treatment: Bupropion XL 300 mg 1 tablet daily - in AM - appropriate, query effective Clonazepam 1 mg 1 tablet twice daily as needed  for anxiety (taking 1/2 tablet before bed) - appropriate, effective, safe, accessible Pristiq 25 mg 1 tablet daily - in PM - appropriate, query effective -Medications previously tried/failed: Lexapro (unknown), Effexor (unknown) -PHQ9: 0 -GAD7: n/a -Connected with behavioral health [id number]) for mental health support -Educated on Benefits of medication for symptom control Benefits of cognitive-behavioral therapy with or without medication -Counseled on long term risks of taking benzodiazepines and  patient reported nightmares have lessened since decreasing dose  Recommended contacting behavioral health as this can likely help with an appropriate diagnosis.  DVT history (Goal: prevent future blood clots) -Controlled -Current treatment  Xarelto 10 mg 1 tablet daily Aspirin 81 mg 2 tablets daily -Medications previously tried: none  -Counseled on monitoring for signs of bleeding such as unexplained and excessive bleeding from a cut or injury, easy or excessive bruising, blood in urine or stools, and nosebleeds without a known cause  BPH (Goal: minimize symptoms) -Controlled -Current treatment  Finasteride 5 mg 1 tablet daily (in AM) - appropriate, effective, safe, accessible Tamsulosin 0.4 mg 1 capsule daily (in PM) - appropriate, effective, safe, accessible -Medications previously tried: terazosin (couldn't urinate)  -Recommended to continue current medication  Fluid retention (Goal: minimize swelling) -Controlled -Current treatment  Furosemide 20 mg 1 tablet daily or as needed - appropriate, effective, safe, accessible -Medications previously tried: none  - Patient only takes if right ankle swells with salt in diet and he does watch his salt intake and uses low sodium salt morton lite  Osteoarthritis (Goal: minimize pain) -Controlled -Current treatment  Tramadol 50 mg 1 tablet as needed - appropriate, effective, safe, accessible Acetaminophen 500 mg 2 tablets as needed - appropriate, effective, safe, accessible -Medications previously tried: none  -Recommended to continue current medication  Constipation (Goal: regular bowel movements) -Controlled -Current treatment  Bisacodyl 5 mg 1 tablet at bedtime - appropriate, effective, safe, accessible Sennakot 1 tablet daily - appropriate, effective, safe, accessible Metamucil 1 spoonful daily - appropriate, effective, safe, accessible -Medications previously tried: none  -Recommended to continue current medication  Health  Maintenance -Vaccine gaps: Prevnar 20 or Pneumovax 23, second dose of shingrix -Current therapy:  GABBA 750 mg 1 tablet daily in evening  Melatonin 3 mg 1 tablet at bedtime  Diltiazem gel 2% once to twice daily Isopto tears 1 drop 1-2 times a day (artificial tears) -Educated on Cost vs benefit of each product must be carefully weighed by individual consumer -Patient is satisfied with current therapy and denies issues -Recommended to continue current medication  Patient Goals/Self-Care Activities Patient will:  - take medications as prescribed check blood pressure twice weekly, document, and provide at future appointments target a minimum of 150 minutes of moderate intensity exercise weekly  Follow Up Plan: Telephone follow up appointment with care management team member scheduled for: 6 months      Medication Assistance: None required.  Patient affirms current coverage meets needs.  Compliance/Adherence/Medication fill history: Care Gaps: Hep C screening, Prevnar20 or Pneumovax Last BP - 138/85 on 04/16/2021   Star-Rating Drugs: None  Patient's preferred pharmacy is:  [id number] - [place], [place] - [address] AT [place] [address] [place] [address] Phone: [phone] Fax: [phone]   Uses pill box? No - uses a shelf on his kitchen cabinet separated into morning and evening Pt endorses 100% compliance  We discussed: Current pharmacy is preferred with insurance plan and patient is satisfied with pharmacy services Patient  decided to: Continue current medication management strategy  Care Plan and Follow Up Patient Decision:  Patient agrees to Care Plan and Follow-up.  Plan: Telephone follow up appointment with care management team member scheduled for:  6 months  Jeni Salles, PharmD [place] Pharmacist [place] at [place] [phone]

## 2021-07-17 NOTE — Patient Instructions (Signed)
Hi Joseph Tran,  It was great to catch up with you again! I am glad you are feeling better. Don't forget to let your pulmonologist know about you stopping the Advair as I am not sure if they are aware.  I also think it would be helpful to really consider speaking with behavioral health and I think it would be worth discussing with Dr. Sarajane Jews as well.   Please reach out to me if you have any questions or need anything before our follow up!  Best, Maddie  Jeni Salles, PharmD, [facility]   Visit Information   Goals Addressed   None    Patient Care Plan: CCM Pharmacy Care Plan     Problem Identified: Problem: Hypertension, Asthma, Depression, Anxiety, Osteoarthritis, BPH and DVT, fluid retention, and constipation      Long-Range Goal: Patient-Specific Goal   Start Date: 10/14/2020  Expected End Date: 10/14/2021  Recent Progress: On track  Priority: High  Note:   Current Barriers:  Unable to independently monitor therapeutic efficacy Unable to achieve control of blood pressure  Unable to maintain control of anxiety/depression  Pharmacist Clinical Goal(s):  Patient will achieve adherence to monitoring guidelines and medication adherence to achieve therapeutic efficacy achieve control of blood pressure as evidenced by home blood pressure readings  through collaboration with PharmD and provider.   Interventions: 1:1 collaboration with [name], MD regarding development and update of comprehensive plan of care as evidenced by provider attestation and co-signature Inter-disciplinary care team collaboration (see longitudinal plan of care) Comprehensive medication review performed; medication list updated in electronic medical record  Hypertension (BP goal <130/80) -Controlled -Current treatment: Diltiazem 240 mg 1 capsule daily - appropriate, query effective -Medications previously tried: none  -Current home readings:  132/84, 154/90 (Dec 2nd) (arm cuff; checks once a week at work) - checking 2-3 times a week -Current dietary habits: staying away from salt (doesn't add to anything and looks at sodium content of everything) and potassium -Current exercise habits: not going to the gym with COVID, active 4-5 hours a day taking -Denies hypotensive/hypertensive symptoms -Educated on Exercise goal of 150 minutes per week; Importance of home blood pressure monitoring; Proper BP monitoring technique; -Counseled to monitor BP at home weekly, document, and provide log at future appointments -Counseled on diet and exercise extensively Recommended to continue current medication  Asthma/sleep apnea (Goal: control symptoms) -Controlled -Current treatment  Advair 250-50 mcg/dose 1 puff twice daily - appropriate, query effective Albuterol 108 mcg/act 1 puff as needed (2 times a week) - query appropriate -Medications previously tried: none  -Exacerbations requiring treatment in last 6 months: none -Patient denies consistent use of maintenance inhaler -Frequency of rescue inhaler use: twice weekly -Counseled on Benefits of consistent maintenance inhaler use Differences between maintenance and rescue inhalers -Recommended to continue current medication Consider switching Advair to Symbicort for PRN and scheduled use.  Depression/Anxiety (Goal: minimize symptoms) -Controlled -Current treatment: Bupropion XL 300 mg 1 tablet daily - in AM - appropriate, query effective Clonazepam 1 mg 1 tablet twice daily as needed for anxiety (taking 1/2 tablet before bed) - appropriate, effective, safe, accessible Pristiq 25 mg 1 tablet daily - in PM - appropriate, query effective -Medications previously tried/failed: Lexapro (unknown), Effexor (unknown) -PHQ9: 0 -GAD7: n/a -Connected with behavioral health [phone]) for mental health support -Educated on Benefits of medication for symptom control Benefits of  cognitive-behavioral therapy with or without medication -Counseled on long term risks of taking benzodiazepines  and patient reported nightmares have lessened since decreasing dose  Recommended contacting behavioral health as this can likely help with an appropriate diagnosis.  DVT history (Goal: prevent future blood clots) -Controlled -Current treatment  Xarelto 10 mg 1 tablet daily Aspirin 81 mg 2 tablets daily -Medications previously tried: none  -Counseled on monitoring for signs of bleeding such as unexplained and excessive bleeding from a cut or injury, easy or excessive bruising, blood in urine or stools, and nosebleeds without a known cause  BPH (Goal: minimize symptoms) -Controlled -Current treatment  Finasteride 5 mg 1 tablet daily (in AM) - appropriate, effective, safe, accessible Tamsulosin 0.4 mg 1 capsule daily (in PM) - appropriate, effective, safe, accessible -Medications previously tried: terazosin (couldn't urinate)  -Recommended to continue current medication  Fluid retention (Goal: minimize swelling) -Controlled -Current treatment  Furosemide 20 mg 1 tablet daily or as needed - appropriate, effective, safe, accessible -Medications previously tried: none  - Patient only takes if right ankle swells with salt in diet and he does watch his salt intake and uses low sodium salt morton lite  Osteoarthritis (Goal: minimize pain) -Controlled -Current treatment  Tramadol 50 mg 1 tablet as needed - appropriate, effective, safe, accessible Acetaminophen 500 mg 2 tablets as needed - appropriate, effective, safe, accessible -Medications previously tried: none  -Recommended to continue current medication  Constipation (Goal: regular bowel movements) -Controlled -Current treatment  Bisacodyl 5 mg 1 tablet at bedtime - appropriate, effective, safe, accessible Sennakot 1 tablet daily - appropriate, effective, safe, accessible Metamucil 1 spoonful daily - appropriate,  effective, safe, accessible -Medications previously tried: none  -Recommended to continue current medication  Health Maintenance -Vaccine gaps: Prevnar 20 or Pneumovax 23, second dose of shingrix -Current therapy:  GABBA 750 mg 1 tablet daily in evening  Melatonin 3 mg 1 tablet at bedtime  Diltiazem gel 2% once to twice daily Isopto tears 1 drop 1-2 times a day (artificial tears) -Educated on Cost vs benefit of each product must be carefully weighed by individual consumer -Patient is satisfied with current therapy and denies issues -Recommended to continue current medication  Patient Goals/Self-Care Activities Patient will:  - take medications as prescribed check blood pressure twice weekly, document, and provide at future appointments target a minimum of 150 minutes of moderate intensity exercise weekly  Follow Up Plan: Telephone follow up appointment with care management team member scheduled for: 6 months       Patient verbalizes understanding of instructions and care plan provided today and agrees to view in [place]. Active MyChart status confirmed with patient.   Telephone follow up appointment with pharmacy team member scheduled for: 6 months  Viona Gilmore, [facility]

## 2021-07-21 ENCOUNTER — Ambulatory Visit (INDEPENDENT_AMBULATORY_CARE_PROVIDER_SITE_OTHER): Payer: Medicare Other | Admitting: Family Medicine

## 2021-07-21 ENCOUNTER — Encounter: Payer: Self-pay | Admitting: Family Medicine

## 2021-07-21 VITALS — BP 120/80 | HR 61 | Temp 98.5°F | Ht 72.0 in | Wt 250.0 lb

## 2021-07-21 DIAGNOSIS — E291 Testicular hypofunction: Secondary | ICD-10-CM | POA: Diagnosis not present

## 2021-07-21 DIAGNOSIS — M1711 Unilateral primary osteoarthritis, right knee: Secondary | ICD-10-CM | POA: Diagnosis not present

## 2021-07-21 DIAGNOSIS — N401 Enlarged prostate with lower urinary tract symptoms: Secondary | ICD-10-CM | POA: Diagnosis not present

## 2021-07-21 DIAGNOSIS — F418 Other specified anxiety disorders: Secondary | ICD-10-CM

## 2021-07-21 DIAGNOSIS — E785 Hyperlipidemia, unspecified: Secondary | ICD-10-CM

## 2021-07-21 DIAGNOSIS — N138 Other obstructive and reflux uropathy: Secondary | ICD-10-CM | POA: Diagnosis not present

## 2021-07-21 DIAGNOSIS — N1831 Chronic kidney disease, stage 3a: Secondary | ICD-10-CM

## 2021-07-21 DIAGNOSIS — R739 Hyperglycemia, unspecified: Secondary | ICD-10-CM | POA: Diagnosis not present

## 2021-07-21 DIAGNOSIS — F515 Nightmare disorder: Secondary | ICD-10-CM | POA: Diagnosis not present

## 2021-07-21 DIAGNOSIS — G4733 Obstructive sleep apnea (adult) (pediatric): Secondary | ICD-10-CM | POA: Diagnosis not present

## 2021-07-21 DIAGNOSIS — J4520 Mild intermittent asthma, uncomplicated: Secondary | ICD-10-CM | POA: Diagnosis not present

## 2021-07-21 DIAGNOSIS — M329 Systemic lupus erythematosus, unspecified: Secondary | ICD-10-CM | POA: Diagnosis not present

## 2021-07-21 LAB — CBC WITH DIFFERENTIAL/PLATELET
Basophils Absolute: 0.1 10*3/uL (ref 0.0–0.1)
Basophils Relative: 1.2 % (ref 0.0–3.0)
Eosinophils Absolute: 0.3 10*3/uL (ref 0.0–0.7)
Eosinophils Relative: 4.8 % (ref 0.0–5.0)
HCT: 42.3 % (ref 39.0–52.0)
Hemoglobin: 14 g/dL (ref 13.0–17.0)
Lymphocytes Relative: 21.5 % (ref 12.0–46.0)
Lymphs Abs: 1.2 10*3/uL (ref 0.7–4.0)
MCHC: 33.1 g/dL (ref 30.0–36.0)
MCV: 92.7 fl (ref 78.0–100.0)
Monocytes Absolute: 0.4 10*3/uL (ref 0.1–1.0)
Monocytes Relative: 7.6 % (ref 3.0–12.0)
Neutro Abs: 3.7 10*3/uL (ref 1.4–7.7)
Neutrophils Relative %: 64.9 % (ref 43.0–77.0)
Platelets: 188 10*3/uL (ref 150.0–400.0)
RBC: 4.57 Mil/uL (ref 4.22–5.81)
RDW: 13.3 % (ref 11.5–15.5)
WBC: 5.6 10*3/uL (ref 4.0–10.5)

## 2021-07-21 LAB — BASIC METABOLIC PANEL
BUN: 26 mg/dL — ABNORMAL HIGH (ref 6–23)
CO2: 27 mEq/L (ref 19–32)
Calcium: 9.2 mg/dL (ref 8.4–10.5)
Chloride: 108 mEq/L (ref 96–112)
Creatinine, Ser: 1.74 mg/dL — ABNORMAL HIGH (ref 0.40–1.50)
GFR: 39.34 mL/min — ABNORMAL LOW (ref >60.00)
Glucose, Bld: 95 mg/dL (ref 70–99)
Potassium: 4 mEq/L (ref 3.5–5.1)
Sodium: 142 mEq/L (ref 135–145)

## 2021-07-21 LAB — TSH: TSH: 2.54 u[IU]/mL (ref 0.35–5.50)

## 2021-07-21 LAB — HEPATIC FUNCTION PANEL
ALT: 16 U/L (ref 0–53)
AST: 16 U/L (ref 0–37)
Albumin: 4 g/dL (ref 3.5–5.2)
Alkaline Phosphatase: 78 U/L (ref 39–117)
Bilirubin, Direct: 0 mg/dL (ref 0.0–0.3)
Total Bilirubin: 0.5 mg/dL (ref 0.2–1.2)
Total Protein: 6.7 g/dL (ref 6.0–8.3)

## 2021-07-21 LAB — LIPID PANEL
Cholesterol: 200 mg/dL (ref 0–200)
HDL: 47.8 mg/dL (ref >39.00)
LDL Cholesterol: 126 mg/dL — ABNORMAL HIGH (ref 0–99)
NonHDL: 151.77
Total CHOL/HDL Ratio: 4
Triglycerides: 129 mg/dL (ref 0.0–149.0)
VLDL: 25.8 mg/dL (ref 0.0–40.0)

## 2021-07-21 LAB — HEMOGLOBIN A1C: Hgb A1c MFr Bld: 5.4 % (ref 4.6–6.5)

## 2021-07-21 LAB — T3, FREE: T3, Free: 3.5 pg/mL (ref 2.3–4.2)

## 2021-07-21 LAB — T4, FREE: Free T4: 0.61 ng/dL (ref 0.60–1.60)

## 2021-07-21 MED ORDER — PRAZOSIN HCL 2 MG PO CAPS: 2.0000 mg | ORAL_CAPSULE | Freq: Every day | ORAL | 2 refills | Status: DC

## 2021-07-21 MED ORDER — ALBUTEROL SULFATE HFA 108 (90 BASE) MCG/ACT IN AERS: INHALATION_SPRAY | RESPIRATORY_TRACT | 3 refills | Status: AC

## 2021-07-21 NOTE — Progress Notes (Signed)
Subjective:    Patient ID: Joseph Tran, male    DOB: 04-Dec-1950, 71 y.o.   MRN: [id number]  HPI Here to follow up on issues. He asks about his low energy levels and why it is so difficult to lose weight. He eats a very healthy diet (oatmeal for breakfast, kale salad with grilled chicken at lunch, and yogurt in the evenings) and he gets exercise taking care of his horses. He also describes excessive sweating all the time. He has done this for years in any type of weather. He had his CPAP settings checked recently by Dr. Ander Slade. His OA is stable. He sees Nephrology and Urology regularly. His asthma is stable. He sleeps fairly well but he says he has frequent nightmares that disrupt his sleep. He has had these for years, but he has never mentioned them before.    Review of Systems  Constitutional: Negative.   HENT: Negative.    Eyes: Negative.   Respiratory: Negative.    Cardiovascular: Negative.   Gastrointestinal: Negative.   Genitourinary: Negative.   Musculoskeletal: Negative.   Skin: Negative.   Neurological: Negative.   Psychiatric/Behavioral: Negative.        Objective:   Physical Exam Constitutional:      General: He is not in acute distress.    Appearance: He is well-developed. He is obese. He is not diaphoretic.  HENT:     Head: Normocephalic and atraumatic.     Right Ear: External ear normal.     Left Ear: External ear normal.     Nose: Nose normal.     Mouth/Throat:     Pharynx: No oropharyngeal exudate.  Eyes:     General: No scleral icterus.       Right eye: No discharge.        Left eye: No discharge.     Conjunctiva/sclera: Conjunctivae normal.     Pupils: Pupils are equal, round, and reactive to light.  Neck:     Thyroid: No thyromegaly.     Vascular: No JVD.     Trachea: No tracheal deviation.  Cardiovascular:     Rate and Rhythm: Normal rate and regular rhythm.     Heart sounds: Normal heart sounds. No murmur heard.   No friction rub. No gallop.   Pulmonary:     Effort: Pulmonary effort is normal. No respiratory distress.     Breath sounds: Normal breath sounds. No wheezing or rales.  Chest:     Chest wall: No tenderness.  Abdominal:     General: Bowel sounds are normal. There is no distension.     Palpations: Abdomen is soft. There is no mass.     Tenderness: There is no abdominal tenderness. There is no guarding or rebound.  Genitourinary:    Penis: No tenderness.   Musculoskeletal:        General: No tenderness. Normal range of motion.     Cervical back: Neck supple.  Lymphadenopathy:     Cervical: No cervical adenopathy.  Skin:    General: Skin is warm and dry.     Coloration: Skin is not pale.     Findings: No erythema or rash.  Neurological:     Mental Status: He is alert and oriented to person, place, and time.     Cranial Nerves: No cranial nerve deficit.     Motor: No abnormal muscle tone.     Coordination: Coordination normal.     Deep Tendon Reflexes: Reflexes are normal  and symmetric. Reflexes normal.  Psychiatric:        Behavior: Behavior normal.        Thought Content: Thought content normal.        Judgment: Judgment normal.          Assessment & Plan:  His OA and asthma and sleep apnea are well controlled. He will follow up with Urology and Nephrology. His depression and anxiety are stable. For his nightmares, we will try him on Prazocin 2 mg at bedtime. Since this will also help the BPH, we will stop the Finasteride. Get fasting labs to check lipids, a full thyroid panel, etc. We spent a total of (34   ) minutes reviewing records and discussing these issues.  [name], MD

## 2021-08-03 ENCOUNTER — Telehealth: Payer: Self-pay | Admitting: Family Medicine

## 2021-08-03 NOTE — Telephone Encounter (Signed)
Pt had last b12 injection on 07-09-2021 and pt would like to know if he suppose to continue getting b12 injection

## 2021-08-04 DIAGNOSIS — I10 Essential (primary) hypertension: Secondary | ICD-10-CM

## 2021-08-04 DIAGNOSIS — M1990 Unspecified osteoarthritis, unspecified site: Secondary | ICD-10-CM

## 2021-08-04 DIAGNOSIS — F32A Depression, unspecified: Secondary | ICD-10-CM

## 2021-08-04 DIAGNOSIS — N400 Enlarged prostate without lower urinary tract symptoms: Secondary | ICD-10-CM | POA: Diagnosis not present

## 2021-08-04 DIAGNOSIS — J45909 Unspecified asthma, uncomplicated: Secondary | ICD-10-CM | POA: Diagnosis not present

## 2021-08-05 NOTE — Telephone Encounter (Signed)
Lvm with information 

## 2021-08-05 NOTE — Telephone Encounter (Signed)
Tell him to continue the monthly shots and we will check another level at his next well exam

## 2021-08-05 NOTE — Telephone Encounter (Signed)
Please advise 

## 2021-08-07 ENCOUNTER — Ambulatory Visit (INDEPENDENT_AMBULATORY_CARE_PROVIDER_SITE_OTHER): Payer: Medicare Other

## 2021-08-07 DIAGNOSIS — E538 Deficiency of other specified B group vitamins: Secondary | ICD-10-CM

## 2021-08-07 MED ORDER — CYANOCOBALAMIN 1000 MCG/ML IJ SOLN
1000.0000 ug | Freq: Once | INTRAMUSCULAR | Status: AC
  Administered 2021-08-07: 1000 ug via INTRAMUSCULAR

## 2021-08-07 NOTE — Progress Notes (Signed)
Pt here for monthly B12 injection per Dr Sarajane Jews.  B12 1036mcg given IM left deltoid and pt tolerated injection well.

## 2021-08-16 ENCOUNTER — Other Ambulatory Visit: Payer: Self-pay | Admitting: Family Medicine

## 2021-08-16 DIAGNOSIS — F418 Other specified anxiety disorders: Secondary | ICD-10-CM

## 2021-08-31 DIAGNOSIS — M25562 Pain in left knee: Secondary | ICD-10-CM | POA: Diagnosis not present

## 2021-09-01 ENCOUNTER — Encounter: Payer: Self-pay | Admitting: Family Medicine

## 2021-09-02 MED ORDER — MIRTAZAPINE 30 MG PO TABS: 30.0000 mg | ORAL_TABLET | Freq: Every day | ORAL | 5 refills | Status: DC

## 2021-09-02 MED ORDER — GABAPENTIN 300 MG PO CAPS: 300.0000 mg | ORAL_CAPSULE | Freq: Every day | ORAL | 5 refills | Status: DC

## 2021-09-02 NOTE — Telephone Encounter (Signed)
The Wellbutrin does not have sexual side effects, so we will stay on this one. I have stopped the Pristiq, and instead I sent in Remeron (Mirtazepine) to take at bedtime. This can have sexual side effects but on my experience these are quite rare. I also sent in for him to try Gabapentin at bedtime to help with the neuropathy in his feet. ?

## 2021-09-03 ENCOUNTER — Telehealth: Payer: Self-pay | Admitting: *Deleted

## 2021-09-03 ENCOUNTER — Encounter: Payer: Self-pay | Admitting: Family Medicine

## 2021-09-03 NOTE — Telephone Encounter (Signed)
Per scheduling message Joseph Tran - Called and lvm of upcoming appointments - requested call back to confirm. ?

## 2021-09-03 NOTE — Telephone Encounter (Signed)
(  1) Yes they are all safe to take for his kidneys, (2) yes simply take the last Venlafaxine one day and start the Mirtazepine the next day, (3) keep taking th Wellbutrin along with it  ?

## 2021-09-04 ENCOUNTER — Ambulatory Visit (INDEPENDENT_AMBULATORY_CARE_PROVIDER_SITE_OTHER): Payer: Medicare Other | Admitting: *Deleted

## 2021-09-04 DIAGNOSIS — E538 Deficiency of other specified B group vitamins: Secondary | ICD-10-CM

## 2021-09-04 MED ORDER — CYANOCOBALAMIN 1000 MCG/ML IJ SOLN
1000.0000 ug | Freq: Once | INTRAMUSCULAR | Status: AC
  Administered 2021-09-04: 1000 ug via INTRAMUSCULAR

## 2021-09-04 NOTE — Progress Notes (Signed)
Per orders of Dr. Fry, injection of Cyanocobalamin 1000mcg given by Leilah Polimeni A. Patient tolerated injection well.  

## 2021-09-07 ENCOUNTER — Other Ambulatory Visit: Payer: Self-pay | Admitting: Orthopedic Surgery

## 2021-09-07 NOTE — Progress Notes (Signed)
Surgery orders requested via Epic inbox. °

## 2021-09-09 NOTE — Patient Instructions (Addendum)
DUE TO COVID-19 ONLY ONE VISITOR  (aged 71 and older)  IS ALLOWED TO COME WITH YOU AND STAY IN THE WAITING ROOM ONLY DURING PRE OP AND PROCEDURE.   ?**NO VISITORS ARE ALLOWED IN THE SHORT STAY AREA OR RECOVERY ROOM!!** ? ?IF YOU WILL BE ADMITTED INTO THE HOSPITAL YOU ARE ALLOWED ONLY TWO SUPPORT PEOPLE DURING VISITATION HOURS ONLY (7 AM -8PM)   ?The support person(s) must pass our screening, gel in and out, and wear a mask at all times, including in the patient?s room. ?Patients must also wear a mask when staff or their support person are in the room. ?Visitors GUEST BADGE MUST BE WORN VISIBLY  ?One adult visitor may remain with you overnight and MUST be in the room by 8 P.M. ?      ? Your procedure is scheduled on: Friday, September 18, 2021 ? ? Report to [facility] Main Entrance ? ?  Report to admitting at  10:30 AM ? ? Call this number if you have problems the morning of surgery [phone] ? ? Do not eat food :After Midnight. ? ? After Midnight you may have the following liquids until 9:45 AM DAY OF SURGERY ? ?Water ?Black Coffee (sugar ok, NO MILK/CREAM OR CREAMERS)  ?Tea (sugar ok, NO MILK/CREAM OR CREAMERS) regular and decaf                             ?Plain Jell-O (NO RED)                                           ?Fruit ices (not with fruit pulp, NO RED)                                     ?Popsicles (NO RED)                                                                  ?Juice: apple, WHITE grape, WHITE cranberry ?Sports drinks like Gatorade (NO RED) ?Clear broth(vegetable,chicken,beef) ? ?             ? ?  ?  ?The day of surgery:  ?Drink ONE (1) Pre-Surgery Clear Ensure or G2 at AM the morning of surgery. Drink in one sitting. Do not sip.  ?This drink was given to you during your hospital  ?pre-op appointment visit. ?Nothing else to drink after completing the  ?Pre-Surgery Clear Ensure or G2. ?  ?       If you have questions, please contact your surgeon?s office. ? ? ?FOLLOW BOWEL PREP AND ANY  ADDITIONAL PRE OP INSTRUCTIONS YOU RECEIVED FROM YOUR SURGEON'S OFFICE!!! ?  ?  ?Oral Hygiene is also important to reduce your risk of infection.                                    ?Remember - BRUSH YOUR TEETH THE MORNING OF SURGERY WITH YOUR REGULAR TOOTHPASTE ? ?  Do NOT smoke after Midnight ? ? Take these medicines the morning of surgery with A SIP OF WATER: Bupropion, Diltiazem, Tamsulosin ? ? Use Advair per normal routine day of surgery ? ? Bring Asthma Inhalers day of surgery ? ?DO NOT TAKE ANY ORAL DIABETIC MEDICATIONS DAY OF YOUR SURGERY ? ?Bring CPAP mask and tubing day of surgery. ?                  ?           You may not have any metal on your body including jewelry, and body piercing ? ?           Do not wear lotions, powders, perfumes/cologne, or deodorant ? ?            Men may shave face and neck. ? ? Do not bring valuables to the hospital. [place] NOT ?            RESPONSIBLE   FOR VALUABLES. ? ? Contacts, dentures or bridgework may not be worn into surgery. ?  ? Patients discharged on the day of surgery will not be allowed to drive home.  Someone NEEDS to stay with you for the first 24 hours after anesthesia. ? ? Special Instructions: Bring a copy of your healthcare power of attorney and living will documents         the day of surgery if you haven't scanned them before. ? ?            Please read over the following fact sheets you were given: IF [place] [phone] ? ?   [facility] - Preparing for Surgery ?Before surgery, you can play an important role.  Because skin is not sterile, your skin needs to be as free of germs as possible.  You can reduce the number of germs on your skin by washing with CHG (chlorahexidine gluconate) soap before surgery.  CHG is an antiseptic cleaner which kills germs and bonds with the skin to continue killing germs even after washing. ?Please DO NOT use if you have an allergy to CHG or antibacterial soaps.   If your skin becomes reddened/irritated stop using the CHG and inform your nurse when you arrive at Short Stay. ?Do not shave (including legs and underarms) for at least 48 hours prior to the first CHG shower.  You may shave your face/neck. ? ?Please follow these instructions carefully: ? 1.  Shower with CHG Soap the night before surgery and the  morning of surgery. ? 2.  If you choose to wash your hair, wash your hair first as usual with your normal  shampoo. ? 3.  After you shampoo, rinse your hair and body thoroughly to remove the shampoo.                            ? 4.  Use CHG as you would any other liquid soap.  You can apply chg directly to the skin and wash.  Gently with a scrungie or clean washcloth. ? 5.  Apply the CHG Soap to your body ONLY FROM THE NECK DOWN.   Do   not use on face/ open      ?                     Wound or open sores. Avoid contact with eyes, ears mouth and  genitals (private parts).  ?                     Production manager,  Genitals (private parts) with your normal soap. ?            6.  Wash thoroughly, paying special attention to the area where your    surgery  will be performed. ? 7.  Thoroughly rinse your body with warm water from the neck down. ? 8.  DO NOT shower/wash with your normal soap after using and rinsing off the CHG Soap. ?               9.  Pat yourself dry with a clean towel. ?           10.  Wear clean pajamas. ?           11.  Place clean sheets on your bed the night of your first shower and do not  sleep with pets. ?Day of Surgery : ?Do not apply any lotions/deodorants the morning of surgery.  Please wear clean clothes to the hospital/surgery center. ? ?FAILURE TO FOLLOW THESE INSTRUCTIONS MAY RESULT IN THE CANCELLATION OF YOUR SURGERY ? ?PATIENT SIGNATURE_________________________________ ? ?NURSE SIGNATURE__________________________________ ? ?________________________________________________________________________  ?

## 2021-09-09 NOTE — Progress Notes (Signed)
Sent message, via epic in basket, requesting orders in epic from surgeon.  

## 2021-09-09 NOTE — Progress Notes (Signed)
Note started for chart prep purposes information received from chart review only. Nurse will complete information at pre op appt. ? ?For Short Stay: ?[place] [facility] appointment date: ?Date of COVID positive in last 90 days: ? ?Bowel Prep reminder: ? ? ?For Anesthesia: ?PCP - [name], MD last office visit 07/21/21 in epic ?Cardiologist -  ?Pulmonologist-Olalere, Adewale A, MD  last office visit 01/30/21 in epic ? ?Chest x-ray - greater than 1 year in epic ?EKG -  ?Stress Test -  ?ECHO -  ?Cardiac Cath -  ?Pacemaker/ICD device last checked: ?Pacemaker orders received: ?Device Rep notified: ? ?Spinal Cord Stimulator: ? ?Sleep Study -  ?CPAP -  ? ?Fasting Blood Sugar -  ?Checks Blood Sugar _____ times a day ?Date and result of last Hgb A1c- 07/21/21 (5.4) in epic ? ?Blood Thinner Instructions: Xarelto ?Aspirin Instructions: ?Last Dose: ? ?Activity level: Can go up a flight of stairs and activities of daily living without stopping and without chest pain and/or shortness of breath ?  Able to exercise without chest pain and/or shortness of breath ?  Unable to go up a flight of stairs without chest pain and/or shortness of breath ?   ? ?Anesthesia review: STAGE 3 KIDNEY DISEASE, OSA, COPD, History of DVT ? ?Patient denies shortness of breath, fever, cough and chest pain at PAT appointment ? ? ?Patient verbalized understanding of instructions that were given to them at the PAT appointment. Patient was also instructed that they will need to review over the PAT instructions again at home before surgery.  ?

## 2021-09-11 ENCOUNTER — Other Ambulatory Visit: Payer: Self-pay

## 2021-09-11 ENCOUNTER — Encounter (HOSPITAL_COMMUNITY)
Admission: RE | Admit: 2021-09-11 | Discharge: 2021-09-11 | Disposition: A | Discharge status: Home / Self Care | Payer: Medicare Other | Source: Ambulatory Visit | Attending: Orthopedic Surgery | Admitting: Orthopedic Surgery

## 2021-09-11 ENCOUNTER — Encounter (HOSPITAL_COMMUNITY): Payer: Self-pay

## 2021-09-11 DIAGNOSIS — Z01818 Encounter for other preprocedural examination: Secondary | ICD-10-CM | POA: Diagnosis not present

## 2021-09-11 DIAGNOSIS — R739 Hyperglycemia, unspecified: Secondary | ICD-10-CM | POA: Diagnosis not present

## 2021-09-11 HISTORY — DX: Essential (primary) hypertension: I10

## 2021-09-11 HISTORY — DX: Unspecified mononeuropathy of bilateral lower limbs: G57.93

## 2021-09-11 HISTORY — DX: Pneumonia, unspecified organism: J18.9

## 2021-09-11 LAB — CBC
HCT: 43.4 % (ref 39.0–52.0)
Hemoglobin: 14.4 g/dL (ref 13.0–17.0)
MCH: 31.6 pg (ref 26.0–34.0)
MCHC: 33.2 g/dL (ref 30.0–36.0)
MCV: 95.4 fL (ref 80.0–100.0)
Platelets: 178 10*3/uL (ref 150–400)
RBC: 4.55 MIL/uL (ref 4.22–5.81)
RDW: 12.2 % (ref 11.5–15.5)
WBC: 5.7 10*3/uL (ref 4.0–10.5)
nRBC: 0 % (ref 0.0–0.2)

## 2021-09-11 LAB — BASIC METABOLIC PANEL
Anion gap: 6 (ref 5–15)
BUN: 23 mg/dL (ref 8–23)
CO2: 22 mmol/L (ref 22–32)
Calcium: 8.9 mg/dL (ref 8.9–10.3)
Chloride: 112 mmol/L — ABNORMAL HIGH (ref 98–111)
Creatinine, Ser: 1.54 mg/dL — ABNORMAL HIGH (ref 0.61–1.24)
GFR, Estimated: 48 mL/min — ABNORMAL LOW (ref >60)
Glucose, Bld: 106 mg/dL — ABNORMAL HIGH (ref 70–99)
Potassium: 3.9 mmol/L (ref 3.5–5.1)
Sodium: 140 mmol/L (ref 135–145)

## 2021-09-11 NOTE — Progress Notes (Addendum)
?  For Short Stay: ?[place] appointment date:N/A ? ?Date of COVID positive in last 90 days: Late December ? ?Bowel Prep reminder:N/A ? ? ? ?PCP - [name], MD last office visit 07/21/21 in epic ?Cardiologist - no ?Pulmonologist-Olalere, Adewale A, MD  last office visit 01/30/21 in epic ? ?Chest x-ray - greater than 1 year in epic ?EKG -  ?Stress Test -  ?ECHO -  ?Cardiac Cath -  ?Pacemaker/ICD device last checked: ?Pacemaker orders received: ?Device Rep notified: ? ?Spinal Cord Stimulator: ? ?Sleep Study -  ?CPAP - yes ? ?Fasting Blood Sugar -  ?Checks Blood Sugar _____ times a day ?Date and result of last Hgb A1c- 07/21/21 (5.4) in epic ? ?Blood Thinner Instructions: Xarelto  stop 2 days prior ?Aspirin Instructions: ?Last Dose: ? ?Activity level: Can go up a flight of stairs and activities of daily living without stopping and without chest pain and/or shortness of breath ?     ? ?Anesthesia review: STAGE 3 KIDNEY DISEASE, OSA, COPD, History of DVT ? ?Patient denies shortness of breath, fever, cough and chest pain at PAT appointment ? ? ?Patient verbalized understanding of instructions that were given to them at the PAT appointment. Patient was also instructed that they will need to review over the PAT instructions again at home before surgery.  ?

## 2021-09-11 NOTE — Patient Instructions (Signed)
DUE TO COVID-19 ONLY ONE VISITOR  (aged 71 and older)  IS ALLOWED TO COME WITH YOU AND STAY IN THE WAITING ROOM ONLY DURING PRE OP AND PROCEDURE.   **NO VISITORS ARE ALLOWED IN THE SHORT STAY AREA OR RECOVERY ROOM!!**  IF YOU WILL BE ADMITTED INTO THE HOSPITAL YOU ARE ALLOWED ONLY TWO SUPPORT PEOPLE DURING VISITATION HOURS ONLY (7 AM -8PM)   The support person(s) must pass our screening, gel in and out, and wear a mask at all times, including in the patients room. Patients must also wear a mask when staff or their support person are in the room. Visitors GUEST BADGE MUST BE WORN VISIBLY  One adult visitor may remain with you overnight and MUST be in the room by 8 P.M.        Your procedure is scheduled on: Friday, September 18, 2021   Report to [facility] Main Entrance    Report to admitting at  10:30 AM   Call this number if you have problems the morning of surgery [phone]   Do not eat food :After Midnight.   After Midnight you may have the following liquids until 9:45 AM DAY OF SURGERY  Water Black Coffee (sugar ok, NO MILK/CREAM OR CREAMERS)  Tea (sugar ok, NO MILK/CREAM OR CREAMERS) regular and decaf                             Plain Jell-O (NO RED)                                           Fruit ices (not with fruit pulp, NO RED)                                     Popsicles (NO RED)                                                                  Juice: apple, WHITE grape, WHITE cranberry Sports drinks like Gatorade (NO RED) Clear broth(vegetable,chicken,beef)                    The day of surgery:  Drink ONE (1) Pre-Surgery Clear Ensure or G2 at AM the morning of surgery. Drink in one sitting. Do not sip.  This drink was given to you during your hospital  pre-op appointment visit. Nothing else to drink after completing the  Pre-Surgery Clear Ensure or G2.          If you have questions, please contact your surgeons office.       Oral Hygiene is also  important to reduce your risk of infection.                                    Remember - BRUSH YOUR TEETH THE MORNING OF SURGERY WITH YOUR REGULAR TOOTHPASTE   Do NOT smoke after Midnight   Take these medicines the morning of surgery with  A SIP OF WATER: Bupropion, Diltiazem, Tamsulosin   Use Advair per normal routine day of surgery   Bring Asthma Inhalers day of surgery  DO NOT TAKE ANY ORAL DIABETIC MEDICATIONS DAY OF YOUR SURGERY  Bring CPAP mask and tubing day of surgery.                              You may not have any metal on your body including jewelry, and body piercing             Do not wear lotions, powders, perfumes/cologne, or deodorant              Men may shave face and neck.   Do not bring valuables to the hospital. [place].   Contacts, dentures or bridgework may not be worn into surgery.    Patients discharged on the day of surgery will not be allowed to drive home.  Someone NEEDS to stay with you for the first 24 hours after anesthesia.   Special Instructions: Bring a copy of your healthcare power of attorney and living will documents         the day of surgery if you haven't scanned them before.              Please read over the following fact sheets you were given: IF YOU HAVE QUESTIONS ABOUT YOUR PRE-OP INSTRUCTIONS PLEASE CALL [phone]     [facility] - Preparing for Surgery Before surgery, you can play an important role.  Because skin is not sterile, your skin needs to be as free of germs as possible.  You can reduce the number of germs on your skin by washing with CHG (chlorahexidine gluconate) soap before surgery.  CHG is an antiseptic cleaner which kills germs and bonds with the skin to continue killing germs even after washing. Please DO NOT use if you have an allergy to CHG or antibacterial soaps.  If your skin becomes reddened/irritated stop using the CHG and inform your nurse when you arrive at  Short Stay. Do not shave (including legs and underarms) for at least 48 hours prior to the first CHG shower.  You may shave your face/neck.  Please follow these instructions carefully:  1.  Shower with CHG Soap the night before surgery and the  morning of surgery.  2.  If you choose to wash your hair, wash your hair first as usual with your normal  shampoo.  3.  After you shampoo, rinse your hair and body thoroughly to remove the shampoo.                             4.  Use CHG as you would any other liquid soap.  You can apply chg directly to the skin and wash.  Gently with a scrungie or clean washcloth.  5.  Apply the CHG Soap to your body ONLY FROM THE NECK DOWN.   Do   not use on face/ open                           Wound or open sores. Avoid contact with eyes, ears mouth and   genitals (private parts).  Wash face,  Genitals (private parts) with your normal soap.             6.  Wash thoroughly, paying special attention to the area where your    surgery  will be performed.  7.  Thoroughly rinse your body with warm water from the neck down.  8.  DO NOT shower/wash with your normal soap after using and rinsing off the CHG Soap.                9.  Pat yourself dry with a clean towel.            10.  Wear clean pajamas.            11.  Place clean sheets on your bed the night of your first shower and do not  sleep with pets. Day of Surgery : Do not apply any lotions/deodorants the morning of surgery.  Please wear clean clothes to the hospital/surgery center.  FAILURE TO FOLLOW THESE INSTRUCTIONS MAY RESULT IN THE CANCELLATION OF YOUR SURGERY  PATIENT SIGNATURE_________________________________  NURSE SIGNATURE__________________________________  ________  Joseph Tran  An incentive spirometer is a tool that can help keep your lungs clear and active. This tool measures how well you are filling your lungs with each breath. Taking long deep breaths may help  reverse or decrease the chance of developing breathing (pulmonary) problems (especially infection) following: A long period of time when you are unable to move or be active. BEFORE THE PROCEDURE  If the spirometer includes an indicator to show your best effort, your nurse or respiratory therapist will set it to a desired goal. If possible, sit up straight or lean slightly forward. Try not to slouch. Hold the incentive spirometer in an upright position. INSTRUCTIONS FOR USE  Sit on the edge of your bed if possible, or sit up as far as you can in bed or on a chair. Hold the incentive spirometer in an upright position. Breathe out normally. Place the mouthpiece in your mouth and seal your lips tightly around it. Breathe in slowly and as deeply as possible, raising the piston or the ball toward the top of the column. Hold your breath for 3-5 seconds or for as long as possible. Allow the piston or ball to fall to the bottom of the column. Remove the mouthpiece from your mouth and breathe out normally. Rest for a few seconds and repeat Steps 1 through 7 at least 10 times every 1-2 hours when you are awake. Take your time and take a few normal breaths between deep breaths. The spirometer may include an indicator to show your best effort. Use the indicator as a goal to work toward during each repetition. After each set of 10 deep breaths, practice coughing to be sure your lungs are clear. If you have an incision (the cut made at the time of surgery), support your incision when coughing by placing a pillow or rolled up towels firmly against it. Once you are able to get out of bed, walk around indoors and cough well. You may stop using the incentive spirometer when instructed by your caregiver.  RISKS AND COMPLICATIONS Take your time so you do not get dizzy or light-headed. If you are in pain, you may need to take or ask for pain medication before doing incentive spirometry. It is harder to take a deep  breath if you are having pain. AFTER USE Rest and breathe slowly and easily. It can be helpful to  keep track of a log of your progress. Your caregiver can provide you with a simple table to help with this. If you are using the spirometer at home, follow these instructions: [place] IF:  You are having difficultly using the spirometer. You have trouble using the spirometer as often as instructed. Your pain medication is not giving enough relief while using the spirometer. You develop fever of 100.5 F (38.1 C) or higher. SEEK IMMEDIATE MEDICAL CARE IF:  You cough up bloody sputum that had not been present before. You develop fever of 102 F (38.9 C) or greater. You develop worsening pain at or near the incision site. MAKE SURE YOU:  Understand these instructions. Will watch your condition. Will get help right away if you are not doing well or get worse. Document Released: 11/01/2006 Document Revised: 09/13/2011 Document Reviewed: 01/02/2007 [facility] Patient Information 2014 [employer], [place].   ________________________________________________________________________ ________________________________________________________________

## 2021-09-13 ENCOUNTER — Other Ambulatory Visit: Payer: Self-pay | Admitting: Hematology & Oncology

## 2021-09-13 DIAGNOSIS — I82401 Acute embolism and thrombosis of unspecified deep veins of right lower extremity: Secondary | ICD-10-CM

## 2021-09-14 ENCOUNTER — Telehealth: Payer: Self-pay | Admitting: *Deleted

## 2021-09-14 ENCOUNTER — Telehealth: Payer: Self-pay | Admitting: Pulmonary Disease

## 2021-09-14 NOTE — Telephone Encounter (Signed)
Patient called requesting instructions for stopping Xarelto and baby aspirin prior to knee surgery on Friday.  Patient to stop Xarelto 2 days prior to surgery, restart the day after surgery.  Patient to stop baby ASA 4 days prior to surgery and restart day after.   Patient understands instructions.   ?

## 2021-09-14 NOTE — Telephone Encounter (Signed)
Fax received from [facility] to perform a Left Knee Arthroscopy on patient.  Patient needs surgery clearance. Surgery is scheduled for this Friday 09/18/2021. Patient was seen on 01/30/2021. Office protocol is a risk assessment can be sent to surgeon if patient has been seen in 60 days or less.  ? ?Sending to Dr. Ander Slade for risk assessment or recommendations if patient needs to be seen in office prior to surgical procedure.   ?

## 2021-09-15 NOTE — Telephone Encounter (Signed)
No respiratory preclusion to surgery ? ?Cleared for surgery from a respiratory perspective ? ? ?He has obstructive sleep apnea that is well treated ?

## 2021-09-15 NOTE — Telephone Encounter (Signed)
OV notes and clearance form have been faxed back to [facility]. Nothing further needed at this time.  

## 2021-09-16 NOTE — Progress Notes (Signed)
Anesthesia Chart Review ? ? Case: [id number] Date/Time: 09/18/21 1237  ? Procedure: ARTHROSCOPY KNEE (Left: Knee)  ? Anesthesia type: Choice  ? Pre-op diagnosis: LEFT KNEE MEDIAL MENISCAL TEAR  ? Location: [facility] ROOM 07 / [facility]  ? Surgeons: Dorna Leitz, MD  ? ?  ? ? ?DISCUSSION:71 y.o. never smoker with h/o PONV, HTN, COPD, asthma, OSA, CKD creatinine stable, DVT (on Xarelto), left knee medial meniscal tear scheduled for above procedure 09/18/2021 with Dr. Dorna Leitz.  ? ?Per pulmonologist, "No respiratory preclusion to surgery. Cleared for surgery from a respiratory perspective." ? ?Pt was advised to hold Xarelto 2 days prior to surgery.  Arthroscopy scheduled for "choice" anesthesia at this time.  ? ?Anticipate pt can proceed with planned procedure barring acute status change.   ?VS: BP (!) 176/94   Pulse 78   Temp 36.9 ?C (Oral)   Resp 16   Ht 6' (1.829 m)   Wt 117.5 kg   SpO2 97%   BMI 35.13 kg/m?  ? ?PROVIDERS: ?[name], MD is PCP  ? ?Sherrilyn Rist, MD is Pulmonologist  ?LABS: Labs reviewed: Acceptable for surgery. ?(all labs ordered are listed, but only abnormal results are displayed) ? ?Labs Reviewed  ?BASIC METABOLIC PANEL - Abnormal; Notable for the following components:  ?    Result Value  ? Chloride 112 (*)   ? Glucose, Bld 106 (*)   ? Creatinine, Ser 1.54 (*)   ? GFR, Estimated 48 (*)   ? All other components within normal limits  ?CBC  ? ? ? ?IMAGES: ? ? ?EKG: ?09/11/2021 ?Rate 79 bpm  ?NSR ?Nonspecific T wave abnormality ? ? ?CV: ? ?Past Medical History:  ?Diagnosis Date  ? 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 Anxiety   ? Arthritis   ? Asthma   ? seasonal, when pollen is high, cold air closes me up  ? Cancer [facility])   ? skin cancer -- arm, scalp, upper back  ? Chronic kidney disease   ? sees Dr. 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  right kidny is at 50%  ? Clotting disorder ([place])   ? Hx DVT - Xarelto  ? Colon polyps   ? Complication of anesthesia   ?  Post op nausea/vomiting  ? COPD (chronic obstructive pulmonary disease) ([place])   ? Depression   ? severe.  dx 1985  ? DVT (deep venous thrombosis) ([place])   ? sees Dr. [name]   ? History of kidney stones   ? has kidney stone now.    ? Hypertension   ? Hypogonadism male   ? sees Dr. Baruch [facility]outy at [facility]  ? Neuropathy of both feet   ? OSA (obstructive sleep apnea)   ? Plantar fasciitis   ? rt foot  ? Pneumonia   ? PONV (postoperative nausea and vomiting)   ? Renal atrophy, left   ? sees Dr. Baruch [facility]outy at [facility]  ? Sleep apnea   ? tested 2010  - wears c-pap  ? ? ?Past Surgical History:  ?Procedure Laterality Date  ? CHEST WALL TUMOR EXCISION  2007  ? COLONOSCOPY  04/07/2020  ? per Dr. Hilarie Fredrickson, adenomatous polyps, repeat in 3 yrs   ? kidney caluculs  2004-05  ? KNEE ARTHROSCOPY  09-10-11  ? right knee, per Dr. Dorna Leitz   ? KNEE ARTHROSCOPY Right 04/25/2017  ? Procedure: RIGHT KNEE ARTHROSCOPY, PARTIAL LATERAL MENISECTOMY, CHONDROPLASTY  MEDIAL AND LATERAL PATELLAOFEMORAL;  Surgeon: Dorna Leitz, MD;  Location: [facility];  Service: Orthopedics;  Laterality: Right;  ? madiscus cartilage  2009  ? MOHS SURGERY    ? right knee arthroscopy  2008  ? TONSILLECTOMY    ? TOTAL KNEE ARTHROPLASTY Right 01/19/2019  ? Procedure: RIGHT TOTAL KNEE ARTHROPLASTY;  Surgeon: Dorna Leitz, MD;  Location: [facility];  Service: Orthopedics;  Laterality: Right;  ? UPPER GASTROINTESTINAL ENDOSCOPY    ? ? ?MEDICATIONS: ? acetaminophen (TYLENOL) 500 MG tablet  ? albuterol (PROAIR HFA) 108 (90 Base) MCG/ACT inhaler  ? aspirin EC 81 MG tablet  ? bisacodyl (DULCOLAX) 5 MG EC tablet  ? buPROPion (WELLBUTRIN XL) 300 MG 24 hr tablet  ? Cholecalciferol 50 MCG (2000 UT) TABS  ? clindamycin (CLEOCIN) 300 MG capsule  ? clonazePAM (KLONOPIN) 1 MG tablet  ? diltiazem (CARDIZEM CD) 240 MG 24 hr capsule  ? diltiazem 2 % GEL  ? docusate sodium (COLACE) 100 MG capsule  ? Fluticasone-Salmeterol (ADVAIR DISKUS) 250-50 MCG/DOSE AEPB  ? furosemide  (LASIX) 20 MG tablet  ? gabapentin (NEURONTIN) 300 MG capsule  ? GAMMA AMINOBUTYRIC ACID PO  ? hydroxypropyl methylcellulose / hypromellose (ISOPTO TEARS / GONIOVISC) 2.5 % ophthalmic solution  ? Melatonin 3 MG TBDP  ? mirtazapine (REMERON) 30 MG tablet  ? prazosin (MINIPRESS) 2 MG capsule  ? psyllium (METAMUCIL) 58.6 % packet  ? tadalafil (CIALIS) 5 MG tablet  ? tamsulosin (FLOMAX) 0.4 MG CAPS capsule  ? terazosin (HYTRIN) 1 MG capsule  ? XARELTO 20 MG TABS tablet  ? ?No current facility-administered medications for this encounter.  ? ?Konrad Felix Ward, PA-C ?[facility] ?[phone] ? ? ? ? ? ? ? ?

## 2021-09-17 ENCOUNTER — Encounter (HOSPITAL_COMMUNITY): Payer: Self-pay | Admitting: Orthopedic Surgery

## 2021-09-17 NOTE — Anesthesia Preprocedure Evaluation (Addendum)
Anesthesia Evaluation  ?Patient identified by MRN, date of birth, ID band ?Patient awake ? ? ? ?Reviewed: ?Allergy & Precautions, NPO status , Patient's Chart, lab work & pertinent test results ? ?History of Anesthesia Complications ?(+) PONV and history of anesthetic complications ? ?Airway ?Mallampati: II ? ?TM Distance: >3 FB ?Neck ROM: Full ? ? ? Dental ?no notable dental hx. ?(+) Teeth Intact, Dental Advisory Given ?  ?Pulmonary ?asthma , sleep apnea and Continuous Positive Airway Pressure Ventilation , COPD,  ?  ?Pulmonary exam normal ?breath sounds clear to auscultation ? ? ? ? ? ? Cardiovascular ?hypertension, Pt. on medications ?+ DVT (on xarelto)  ?Normal cardiovascular exam ?Rhythm:Regular Rate:Normal ? ? ?  ?Neuro/Psych ?negative neurological ROS ?   ? GI/Hepatic ?negative GI ROS,   ?Endo/Other  ?negative endocrine ROS ? Renal/GU ?Renal InsufficiencyRenal diseaseLab Results ?     Component                Value               Date                 ?     CREATININE               1.54 (H)            09/11/2021           ?     BUN                      23                  09/11/2021           ?     NA                       140                 09/11/2021           ?     K                        3.9                 09/11/2021           ?     CL                       112 (H)             09/11/2021           ?     CO2                      22                  09/11/2021           ?  ? ?  ?Musculoskeletal ? ?(+) Arthritis ,  ? Abdominal ?  ?Peds ? Hematology ?Lab Results ?     Component                Value               Date                 ?     WBC  5.7                 09/11/2021           ?     HGB                      14.4                09/11/2021           ?     HCT                      43.4                09/11/2021           ?     MCV                      95.4                09/11/2021           ?     PLT                      178                  09/11/2021           ?   ?Anesthesia Other Findings ?PCN, Allopurinol sulfa ? Reproductive/Obstetrics ? ?  ? ? ? ? ? ? ? ? ? ? ? ? ? ?  ?  ? ? ? ? ? ? ? ?Anesthesia Physical ?Anesthesia Plan ? ?ASA: 3 ? ?Anesthesia Plan: General  ? ?Post-op Pain Management: Precedex and Tylenol PO (pre-op)*  ? ?Induction: Intravenous ? ?PONV Risk Score and Plan: 4 or greater and Treatment may vary due to age or medical condition, Midazolam, Dexamethasone, Ondansetron and TIVA ? ?Airway Management Planned: LMA ? ?Additional Equipment: None ? ?Intra-op Plan:  ? ?Post-operative Plan:  ? ?Informed Consent: I have reviewed the patients History and Physical, chart, labs and discussed the procedure including the risks, benefits and alternatives for the proposed anesthesia with the patient or authorized representative who has indicated his/her understanding and acceptance.  ? ? ? ?Dental advisory given ? ?Plan Discussed with: CRNA and Anesthesiologist ? ?Anesthesia Plan Comments:   ? ? ? ? ? ?Anesthesia Quick Evaluation ? ?

## 2021-09-18 ENCOUNTER — Ambulatory Visit (HOSPITAL_COMMUNITY): Payer: Medicare Other | Admitting: Physician Assistant

## 2021-09-18 ENCOUNTER — Other Ambulatory Visit: Payer: Self-pay

## 2021-09-18 ENCOUNTER — Ambulatory Visit (HOSPITAL_BASED_OUTPATIENT_CLINIC_OR_DEPARTMENT_OTHER): Payer: Medicare Other | Admitting: Anesthesiology

## 2021-09-18 ENCOUNTER — Ambulatory Visit (HOSPITAL_COMMUNITY)
Admission: RE | Admit: 2021-09-18 | Discharge: 2021-09-18 | Disposition: A | Discharge status: Home / Self Care | Payer: Medicare Other | Source: Ambulatory Visit | Attending: Orthopedic Surgery | Admitting: Orthopedic Surgery

## 2021-09-18 ENCOUNTER — Encounter (HOSPITAL_COMMUNITY)
Admission: RE | Disposition: A | Discharge status: Home / Self Care | Payer: Self-pay | Source: Ambulatory Visit | Attending: Orthopedic Surgery

## 2021-09-18 ENCOUNTER — Encounter (HOSPITAL_COMMUNITY): Payer: Self-pay | Admitting: Orthopedic Surgery

## 2021-09-18 DIAGNOSIS — S83282A Other tear of lateral meniscus, current injury, left knee, initial encounter: Secondary | ICD-10-CM | POA: Diagnosis not present

## 2021-09-18 DIAGNOSIS — M2242 Chondromalacia patellae, left knee: Secondary | ICD-10-CM | POA: Diagnosis not present

## 2021-09-18 DIAGNOSIS — X58XXXA Exposure to other specified factors, initial encounter: Secondary | ICD-10-CM | POA: Insufficient documentation

## 2021-09-18 DIAGNOSIS — M23252 Derangement of posterior horn of lateral meniscus due to old tear or injury, left knee: Secondary | ICD-10-CM | POA: Diagnosis not present

## 2021-09-18 DIAGNOSIS — I129 Hypertensive chronic kidney disease with stage 1 through stage 4 chronic kidney disease, or unspecified chronic kidney disease: Secondary | ICD-10-CM | POA: Insufficient documentation

## 2021-09-18 DIAGNOSIS — M94262 Chondromalacia, left knee: Secondary | ICD-10-CM | POA: Diagnosis not present

## 2021-09-18 DIAGNOSIS — N189 Chronic kidney disease, unspecified: Secondary | ICD-10-CM | POA: Insufficient documentation

## 2021-09-18 DIAGNOSIS — Z96651 Presence of right artificial knee joint: Secondary | ICD-10-CM | POA: Insufficient documentation

## 2021-09-18 DIAGNOSIS — Z86718 Personal history of other venous thrombosis and embolism: Secondary | ICD-10-CM | POA: Insufficient documentation

## 2021-09-18 DIAGNOSIS — M23222 Derangement of posterior horn of medial meniscus due to old tear or injury, left knee: Secondary | ICD-10-CM | POA: Diagnosis not present

## 2021-09-18 DIAGNOSIS — S83242A Other tear of medial meniscus, current injury, left knee, initial encounter: Secondary | ICD-10-CM

## 2021-09-18 DIAGNOSIS — M1990 Unspecified osteoarthritis, unspecified site: Secondary | ICD-10-CM | POA: Diagnosis not present

## 2021-09-18 DIAGNOSIS — M1712 Unilateral primary osteoarthritis, left knee: Secondary | ICD-10-CM | POA: Diagnosis present

## 2021-09-18 DIAGNOSIS — G4733 Obstructive sleep apnea (adult) (pediatric): Secondary | ICD-10-CM | POA: Diagnosis not present

## 2021-09-18 DIAGNOSIS — J449 Chronic obstructive pulmonary disease, unspecified: Secondary | ICD-10-CM | POA: Insufficient documentation

## 2021-09-18 DIAGNOSIS — Z01818 Encounter for other preprocedural examination: Secondary | ICD-10-CM

## 2021-09-18 DIAGNOSIS — M6752 Plica syndrome, left knee: Secondary | ICD-10-CM | POA: Insufficient documentation

## 2021-09-18 DIAGNOSIS — R739 Hyperglycemia, unspecified: Secondary | ICD-10-CM

## 2021-09-18 DIAGNOSIS — S83272A Complex tear of lateral meniscus, current injury, left knee, initial encounter: Secondary | ICD-10-CM | POA: Diagnosis present

## 2021-09-18 HISTORY — PX: KNEE ARTHROSCOPY: SHX127

## 2021-09-18 HISTORY — PX: KNEE ARTHROSCOPY WITH MEDIAL MENISECTOMY: SHX5651

## 2021-09-18 SURGERY — ARTHROSCOPY, KNEE
Anesthesia: General | Site: Knee | Laterality: Left

## 2021-09-18 MED ORDER — EPINEPHRINE PF 1 MG/ML IJ SOLN
INTRAMUSCULAR | Status: AC
  Filled 2021-09-18: qty 1

## 2021-09-18 MED ORDER — MIDAZOLAM HCL 5 MG/5ML IJ SOLN
INTRAMUSCULAR | Status: DC | PRN
  Administered 2021-09-18: 2 mg via INTRAVENOUS

## 2021-09-18 MED ORDER — DEXAMETHASONE SODIUM PHOSPHATE 4 MG/ML IJ SOLN
INTRAMUSCULAR | Status: DC | PRN
  Administered 2021-09-18: 10 mg via INTRAVENOUS

## 2021-09-18 MED ORDER — CHLORHEXIDINE GLUCONATE 0.12 % MT SOLN
15.0000 mL | Freq: Once | OROMUCOSAL | Status: AC
  Administered 2021-09-18: 15 mL via OROMUCOSAL

## 2021-09-18 MED ORDER — PROPOFOL 1000 MG/100ML IV EMUL
INTRAVENOUS | Status: AC
  Filled 2021-09-18: qty 100

## 2021-09-18 MED ORDER — MIDAZOLAM HCL 2 MG/2ML IJ SOLN
INTRAMUSCULAR | Status: AC
  Filled 2021-09-18: qty 2

## 2021-09-18 MED ORDER — KETAMINE HCL 10 MG/ML IJ SOLN
INTRAMUSCULAR | Status: DC | PRN
  Administered 2021-09-18: 20 mg via INTRAVENOUS

## 2021-09-18 MED ORDER — LACTATED RINGERS IV SOLN: INTRAVENOUS | Status: DC

## 2021-09-18 MED ORDER — VANCOMYCIN HCL 1500 MG/300ML IV SOLN
1500.0000 mg | INTRAVENOUS | Status: AC
  Administered 2021-09-18: 1500 mg via INTRAVENOUS
  Filled 2021-09-18: qty 300

## 2021-09-18 MED ORDER — EPINEPHRINE PF 1 MG/ML IJ SOLN
INTRAMUSCULAR | Status: DC | PRN
  Administered 2021-09-18: 1 mg

## 2021-09-18 MED ORDER — PHENYLEPHRINE HCL-NACL 20-0.9 MG/250ML-% IV SOLN
INTRAVENOUS | Status: AC
  Filled 2021-09-18: qty 500

## 2021-09-18 MED ORDER — DEXMEDETOMIDINE (PRECEDEX) IN NS 20 MCG/5ML (4 MCG/ML) IV SYRINGE
PREFILLED_SYRINGE | INTRAVENOUS | Status: AC
  Filled 2021-09-18: qty 10

## 2021-09-18 MED ORDER — HYDROCODONE-ACETAMINOPHEN 5-325 MG PO TABS: 1.0000 | ORAL_TABLET | Freq: Four times a day (QID) | ORAL | 0 refills | Status: DC | PRN

## 2021-09-18 MED ORDER — ORAL CARE MOUTH RINSE: 15.0000 mL | Freq: Once | OROMUCOSAL | Status: AC

## 2021-09-18 MED ORDER — DEXMEDETOMIDINE (PRECEDEX) IN NS 20 MCG/5ML (4 MCG/ML) IV SYRINGE
PREFILLED_SYRINGE | INTRAVENOUS | Status: DC | PRN
  Administered 2021-09-18 (×2): 8 ug via INTRAVENOUS

## 2021-09-18 MED ORDER — FENTANYL CITRATE (PF) 100 MCG/2ML IJ SOLN
INTRAMUSCULAR | Status: AC
  Filled 2021-09-18: qty 2

## 2021-09-18 MED ORDER — KETAMINE HCL 10 MG/ML IJ SOLN
INTRAMUSCULAR | Status: AC
  Filled 2021-09-18: qty 1

## 2021-09-18 MED ORDER — FENTANYL CITRATE (PF) 100 MCG/2ML IJ SOLN
INTRAMUSCULAR | Status: DC | PRN
  Administered 2021-09-18 (×2): 50 ug via INTRAVENOUS

## 2021-09-18 MED ORDER — BUPIVACAINE HCL (PF) 0.5 % IJ SOLN
INTRAMUSCULAR | Status: AC
  Filled 2021-09-18: qty 30

## 2021-09-18 MED ORDER — PROPOFOL 10 MG/ML IV BOLUS
INTRAVENOUS | Status: DC | PRN
  Administered 2021-09-18: 180 mg via INTRAVENOUS
  Administered 2021-09-18: 100 ug/kg/min via INTRAVENOUS

## 2021-09-18 MED ORDER — PROPOFOL 10 MG/ML IV BOLUS
INTRAVENOUS | Status: AC
  Filled 2021-09-18: qty 20

## 2021-09-18 MED ORDER — SODIUM CHLORIDE 0.9 % IR SOLN
Status: DC | PRN
  Administered 2021-09-18: 3000 mL

## 2021-09-18 MED ORDER — LIDOCAINE 2% (20 MG/ML) 5 ML SYRINGE
INTRAMUSCULAR | Status: DC | PRN
Start: 2021-09-18 — End: 2021-09-18
  Administered 2021-09-18: 100 mg via INTRAVENOUS

## 2021-09-18 MED ORDER — ONDANSETRON HCL 4 MG/2ML IJ SOLN
INTRAMUSCULAR | Status: DC | PRN
  Administered 2021-09-18: 4 mg via INTRAVENOUS

## 2021-09-18 MED ORDER — BUPIVACAINE HCL (PF) 0.5 % IJ SOLN
INTRAMUSCULAR | Status: DC | PRN
  Administered 2021-09-18: 30 mL

## 2021-09-18 SURGICAL SUPPLY — 31 items
BAG COUNTER SPONGE SURGICOUNT (BAG) IMPLANT
BLADE EXCALIBUR 4.0X13 (MISCELLANEOUS) IMPLANT
BNDG ELASTIC 6X5.8 VLCR STR LF (GAUZE/BANDAGES/DRESSINGS) ×2 IMPLANT
BOOTIES KNEE HIGH SLOAN (MISCELLANEOUS) ×4 IMPLANT
DISSECTOR 4.0MM X 13CM (MISCELLANEOUS) IMPLANT
DRAPE U-SHAPE 47X51 STRL (DRAPES) ×2 IMPLANT
DRSG EMULSION OIL 3X3 NADH (GAUZE/BANDAGES/DRESSINGS) ×2 IMPLANT
DURAPREP 26ML APPLICATOR (WOUND CARE) ×2 IMPLANT
DW OUTFLOW CASSETTE/TUBE SET (MISCELLANEOUS) ×2 IMPLANT
FILTER STRAW (MISCELLANEOUS) ×2 IMPLANT
GAUZE 4X4 16PLY ~~LOC~~+RFID DBL (SPONGE) ×2 IMPLANT
GAUZE SPONGE 4X4 12PLY STRL (GAUZE/BANDAGES/DRESSINGS) ×2 IMPLANT
GLOVE SRG 8 PF TXTR STRL LF DI (GLOVE) ×2 IMPLANT
GLOVE SURG NEOP MICRO LF SZ7.5 (GLOVE) ×4 IMPLANT
GLOVE SURG UNDER POLY LF SZ8 (GLOVE) ×2
GOWN STRL REUS W/ TWL XL LVL3 (GOWN DISPOSABLE) ×2 IMPLANT
GOWN STRL REUS W/TWL XL LVL3 (GOWN DISPOSABLE) ×2
KIT BASIN OR (CUSTOM PROCEDURE TRAY) ×2 IMPLANT
MANIFOLD NEPTUNE II (INSTRUMENTS) ×2 IMPLANT
NDL SAFETY ECLIPSE 18X1.5 (NEEDLE) ×1 IMPLANT
NEEDLE HYPO 18GX1.5 SHARP (NEEDLE) ×1
PACK ARTHROSCOPY WL (CUSTOM PROCEDURE TRAY) ×2 IMPLANT
PAD MASON LEG HOLDER (PIN) IMPLANT
PADDING CAST ABS 6INX4YD NS (CAST SUPPLIES) ×1
PADDING CAST ABS COTTON 6X4 NS (CAST SUPPLIES) IMPLANT
PADDING CAST COTTON 6X4 STRL (CAST SUPPLIES) ×2 IMPLANT
PENCIL SMOKE EVACUATOR (MISCELLANEOUS) IMPLANT
PORT APPOLLO RF 90DEGREE MULTI (SURGICAL WAND) ×1 IMPLANT
SUT ETHILON 4 0 PS 2 18 (SUTURE) IMPLANT
TUBING ARTHROSCOPY IRRIG 16FT (MISCELLANEOUS) ×2 IMPLANT
WRAP KNEE MAXI GEL POST OP (GAUZE/BANDAGES/DRESSINGS) ×2 IMPLANT

## 2021-09-18 NOTE — Anesthesia Postprocedure Evaluation (Signed)
Anesthesia Post Note ? ?Patient: Joseph Tran ? ?Procedure(s) Performed: ARTHROSCOPY KNEE (Left: Knee) ?KNEE ARTHROSCOPY WITH MEDIAL MENISECTOMY (Left: Knee) ? ?  ? ?Patient location during evaluation: PACU ?Anesthesia Type: General ?Level of consciousness: awake and alert ?Pain management: pain level controlled ?Vital Signs Assessment: post-procedure vital signs reviewed and stable ?Respiratory status: spontaneous breathing, nonlabored ventilation, respiratory function stable and patient connected to nasal cannula oxygen ?Cardiovascular status: blood pressure returned to baseline and stable ?Postop Assessment: no apparent nausea or vomiting ?Anesthetic complications: no ? ? ?No notable events documented. ? ?Last Vitals:  ?Vitals:  ? 09/18/21 1145 09/18/21 1200  ?BP: 114/80   ?Pulse: 63 60  ?Resp: (!) 8 14  ?Temp:  (!) 36.4 ?C  ?SpO2: 97% 95%  ?  ?Last Pain:  ?Vitals:  ? 09/18/21 1200  ?TempSrc:   ?PainSc: 0-No pain  ? ? ?  ?  ?  ?  ?  ?  ? ?[name] ? ? ? ? ?

## 2021-09-18 NOTE — Discharge Instructions (Signed)
POST-OP KNEE ARTHROSCOPY INSTRUCTIONS  °Dr. John Graves/Jim Shameek Nyquist PA-C ° °Pain °You will be expected to have a moderate amount of pain in the affected knee for approximately two weeks. However, the first two days will be the most severe pain. A prescription has been provided to take as needed for the pain. The pain can be reduced by applying ice packs to the knee for the first 1-2 weeks post surgery. Also, keeping the leg elevated on pillows will help alleviate the pain. If you develop any acute pain or swelling in your calf muscle, please call the doctor. ° °Activity °It is preferred that you stay at bed rest for approximately 24 hours. However, you may go to the bathroom with help. Weight bearing as tolerated. You may begin the knee exercises the day of surgery. Discontinue crutches as the knee pain resolves. ° °Dressing °Keep the dressing dry. If the ace bandage should wrinkle or roll up, this can be rewrapped to prevent ridges in the bandage. You may remove all dressings in 48 hours,  apply bandaids to each wound. You may shower on the 4th day after surgery but no tub bath. ° °Symptoms to report to your doctor °Extreme pain °Extreme swelling °Temperature above 101 degrees °Change in the feeling, color, or movement of your toes °Redness, heat, or swelling at your incision ° °Exercise °If is preferred that as soon as possible you try to do a straight leg raise without bending the knee and concentrate on bringing the heel of your foot off the bed up to approximately 45 degrees and hold for the count of 10 seconds. Repeat this at least 10 times three or four times per day. Additional exercises are provided below. ° °You are encouraged to bend the knee as tolerated. ° °Follow-Up °Call to schedule a follow-up appointment in 5-7 days. Our office # is [phone]. ° °POST-OP EXERCISES ° °Short Arc Quads ° °1. Lie on back with legs straight. Place towel roll under thigh, just above knee. °2. Tighten thigh muscles to  straighten knee and lift heel off bed. °3. Hold for slow count of five, then lower. °4. Do three sets of ten ° ° ° °Straight Leg Raises ° °1. Lie on back with operative leg straight and other leg bent. °2. Keeping operative leg completely straight, slowly lift operative leg so foot is 5 inches off bed. °3. Hold for slow count of five, then lower. °4. Do three sets of ten. ° ° ° °DO BOTH EXERCISES 2 TIMES A DAY ° °Ankle Pumps ° °Work/move the operative ankle and foot up and down 10 times every hour while awake. °

## 2021-09-18 NOTE — Op Note (Signed)
NAME: Joseph Tran, Joseph E. ?MEDICAL RECORD NO: [id number] ?ACCOUNT NO: [id number] ?DATE OF BIRTH: 03/07/1951 ?FACILITY: [facility] ?LOCATION: [facility] ?PHYSICIAN: Alta Corning, MD ? ?Operative Report  ? ?DATE OF PROCEDURE: 09/18/2021 ? ?PREOPERATIVE DIAGNOSIS:  Medial meniscal tear. ? ?POSTOPERATIVE DIAGNOSES:   ?1.  Medial meniscal tear. ?2.  Lateral meniscal tear. ?3.  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 ?4.  Medial plica. ? ?PROCEDURE: ?1.  Left knee arthroscopy with partial medial and partial lateral meniscectomy. ?2.  Abrasion arthroplasty, medial, lateral, and patellofemoral. ?3.  Medial plica excision. ? ?SURGEON:  Dorna Leitz, MD ? ?ASSISTANT:  None. ? ?ANESTHESIA:  General. ? ?INDICATIONS FOR PROCEDURE:  The patient is a 71 year old male with a long history of significant complaints of left knee pain.  He had a previous right knee arthroplasty, which has done great for him.  We talked about the possibility of need for  ?arthroplasty here and he felt that he wanted to try arthroscopy.  I thought it was reasonable given that he had a medial meniscal tear by MRI and did not have a full-thickness loss by plain x-ray.  He was brought to the operating room for this procedure  ?as a temporizing measure. ? ?DESCRIPTION OF PROCEDURE:  The patient was brought to the operating room.  After adequate anesthesia was obtained with a general anesthetic, the patient was placed supine on the operating table.  Left leg was then prepped and draped in the usual sterile  ?fashion.  Following this, routine arthroscopic examination of knee revealed that there was chondromalacia of the patellofemoral joint, particularly the patellofemoral trochlea. Entrance was blocked in the medial compartment by a large medial plica and  ?this was debrided back to  the rim.  Attention was then turned to the medial compartment, there was a posterior horn medial meniscal tear, which was debrided back to the rim of meniscus.  The medial tibial plateau had grade IV change over an area of about ? 1 x 1 cm.  This was debrided around the edges to get any loose and fragmented pieces, medial femoral condyle had some grade II and grade III change, which was debrided.  Attention was turned to the ACL, normal.  Attention turned to the lateral side, was ? a posterior horn lateral meniscal tear, which was debrided back to a smooth and stable rim of meniscus.  The lateral tibial plateau had a large area probably 2 x 2 cm of exposed bone.  Lateral femoral condyle had some grade II and III chondromalacia.   ?This was debrided.  The knee was finally checked for any loose and fragmented pieces of meniscus or cartilage.  None was seen.  The knee was irrigated and suctioned dry and the sterile compressive dressing was applied after 30 mL of 0.25% Marcaine was  ?instilled in the knee for postoperative anesthesia.  Sterile compressive dressing was applied.  The patient was taken to recovery room, was noted to be in satisfactory condition.  Estimated blood loss for procedure was minimal. ? ? ?SHY ?D: 09/18/2021 11:18:32 am T: 09/18/2021 11:42:00 am  ?JOB: [id number]/ [id number]  ?

## 2021-09-18 NOTE — Brief Op Note (Signed)
09/18/2021 ? ?11:14 AM ? ?PATIENT:  Joseph Tran  71 y.o. male ? ?PRE-OPERATIVE DIAGNOSIS:  LEFT KNEE MEDIAL MENISCAL TEAR ? ?POST-OPERATIVE DIAGNOSIS:  LEFT KNEE MEDIAL MENISCAL TEAR ? ?PROCEDURE:  Procedure(s): ?ARTHROSCOPY KNEE (Left) ?KNEE ARTHROSCOPY WITH MEDIAL MENISECTOMY (Left) ? ?SURGEON:  Surgeon(s) and Role: ?   Dorna Leitz, MD - Primary ? ?PHYSICIAN ASSISTANT:  ? ?ASSISTANTS: none  ? ?ANESTHESIA:   general ? ?EBL:  minimal ? ? ?BLOOD ADMINISTERED:none ? ?DRAINS: none  ? ?LOCAL MEDICATIONS USED:  MARCAINE    ? ?SPECIMEN:  No Specimen ? ?DISPOSITION OF SPECIMEN:  N/A ? ?COUNTS:  YES ? ?TOURNIQUET:  * No tourniquets in log * ? ?DICTATION: .Other Dictation: Dictation Number [id number] ? ?PLAN OF CARE: Discharge to home after PACU ? ?PATIENT DISPOSITION:  PACU - hemodynamically stable. ?  ?Delay start of Pharmacological VTE agent (>24hrs) due to surgical blood loss or risk of bleeding: no ? ?

## 2021-09-18 NOTE — Anesthesia Procedure Notes (Signed)
Procedure Name: LMA Insertion ?Date/Time: 09/18/2021 10:41 AM ?Performed by: Lavina Hamman, CRNA ?Pre-anesthesia Checklist: Patient identified, Emergency Drugs available, Suction available and Patient being monitored ?Patient Re-evaluated:Patient Re-evaluated prior to induction ?Oxygen Delivery Method: Circle System Utilized ?Preoxygenation: Pre-oxygenation with 100% oxygen ?Induction Type: IV induction ?Ventilation: Mask ventilation without difficulty ?LMA: LMA inserted ?LMA Size: 5.0 ?Number of attempts: 1 ?Airway Equipment and Method: Bite block ?Placement Confirmation: positive ETCO2 ?Tube secured with: Tape ?Dental Injury: Teeth and Oropharynx as per pre-operative assessment  ? ? ? ? ?

## 2021-09-18 NOTE — Transfer of Care (Signed)
Immediate Anesthesia Transfer of Care Note ? ?Patient: Joseph Tran ? ?Procedure(s) Performed: Procedure(s): ?ARTHROSCOPY KNEE (Left) ?KNEE ARTHROSCOPY WITH MEDIAL MENISECTOMY (Left) ? ?Patient Location: PACU ? ?Anesthesia Type:General ? ?Level of Consciousness:  sedated, patient cooperative and responds to stimulation ? ?Airway & Oxygen Therapy:Patient Spontanous Breathing and Patient connected to face mask oxgen ? ?Post-op Assessment:  Report given to PACU RN and Post -op Vital signs reviewed and stable ? ?Post vital signs:  Reviewed and stable ? ?Last Vitals:  ?Vitals:  ? 09/18/21 0826 09/18/21 1120  ?BP: (!) 156/98 111/69  ?Pulse: 79 66  ?Resp: 15 17  ?Temp: 36.7 ?C (!) 36.4 ?C  ?SpO2: 99% 94%  ? ? ?Complications: No apparent anesthesia complications ? ?

## 2021-09-18 NOTE — H&P (Signed)
A pre op hand p   Chief Complaint: Left knee pain  HPI: Joseph Tran is a 71 y.o. male who presents for evaluation of left knee pain. It has been present for greater than 25-month and has been worsening. He has failed conservative measures. Pain is rated as moderate.  Past Medical History:  Diagnosis Date   Acoustic neuroma (HCC)    benign - left   Anxiety    Arthritis    Asthma    seasonal, when pollen is high, cold air closes me up   Cancer [facility])    skin cancer -- arm, scalp, upper back   Chronic kidney disease    sees Dr. Jaye Beagle at [facility], left kidney is non=functioning.  right kidny is at 50%   Clotting disorder (HCC)    Hx DVT - Xarelto   Colon polyps    Complication of anesthesia    Post op nausea/vomiting   COPD (chronic obstructive pulmonary disease) (HCC)    Depression    severe.  dx 1985   DVT (deep venous thrombosis) (HCC)    sees Dr. Arlan Organ    History of kidney stones    has kidney stone now.     Hypertension    Hypogonadism male    se[facility] Dr. Hadley Pen at [facility]   Neuropathy of both feet    OSA (obstructive sleep apnea)    Plantar fasciitis    rt foot   Pneumonia    PONV (postoperative nausea and vomiting)    Renal atrophy, left    sees Dr. Hadley Pen at [facility]  [facility]rology   Sleep apnea    tested 2010  - wears c-pap   Past Surgical History:  Procedure Laterality Date   CHEST WALL TUMOR EXCISION  2007   COLONOSCOPY  04/07/2020   per Dr. Rhea Belton, adenomatous polyps, repeat in 3 yrs    kidney caluculs  2004-05   KNEE ARTHROSCOPY  09-10-11   right knee, per Dr. Jodi Geralds    KNEE ARTHROSCOPY Right 04/25/2017   Procedure: RIGHT KNEE ARTHROSCOPY, PARTIAL LATERAL MENISECTOMY, CHONDROPLASTY MEDIAL AND LATERAL PATELLAOFEMORAL;  Surgeon: Jodi Geralds, MD;  Location: [facility] OR;  Service: Orthopedics;  Laterality: Right;   madiscus cartilage  2009   MOHS SURGERY     right knee arthroscopy  2008   TONSILLECTOMY     TOTAL KNEE  ARTHROPLASTY Right 01/19/2019   Procedure: RIGHT TOTAL KNEE ARTHROPLASTY;  Surgeon: Jodi Geralds, MD;  Location: [facility]S;  Service: Orthopedics;  Laterality: Right;   UPPER GASTROINTESTINAL ENDOSCOPY     Social History   Socioeconomic History   Marital status: Single    Spouse name: Not on file   Number of children: 0   Years of education: Not on file   Highest education level: Not on file  Occupational History   Occupation: retired  Tobacco Use   Smoking status: Never   Smokeless tobacco: Never   Tobacco comments:    NEVER USED TOBACCO  Vaping Use   Vaping Use: Never used  Substance and Sexual Activity   Alcohol use: Never   Drug use: Never   Sexual activity: Not Currently  Other Topics Concern   Not on file  Social History Narrative   Not on file   Social Determinants of Health   Financial Resource Strain: Not on file  Food Insecurity: No Food Insecurity   Worried About Running Out of Food in the Last Year: Never true  Ran Out of Food in the Last Year: Never true  Transportation Needs: No Transportation Needs   Lack of Transportation (Medical): No   Lack of Transportation (Non-Medical): No  Physical Activity: Sufficiently Active   Days of Exercise per Week: 5 days   Minutes of Exercise per Session: 60 min  Stress: No Stress Concern Present   Feeling of Stress : Not at all  Social Connections: Socially Isolated   Frequency of Communication with Friends and Family: Three times a week   Frequency of Social Gatherings with Friends and Family: Three times a week   Attends Religious Services: Never   Active Member of Clubs or Organizations: No   Attends Engineer, structural: Never   Marital Status: Never married   Family History  Problem Relation Age of Onset   Heart disease Other        parents   Hyperlipidemia Other    Stroke Other        grandparents   Sudden death Other        uncle less than 20 yrs old   Heart disease Father    Throat cancer  Maternal Grandmother    Liver cancer Paternal Grandmother    Colon cancer Neg Hx    Esophageal cancer Neg Hx    Rectal cancer Neg Hx    Stomach cancer Neg Hx    Allergies  Allergen Reactions   Allopurinol Other (See Comments)    PATIENT PREFERENCE Pt refused due to mom developing steven johnson syndrome.   Penicillins Hives    Has patient had a PCN reaction causing immediate rash, facial/tongue/throat swelling, SOB or lightheadedness with hypotension: Unknown Has patient had a PCN reaction causing severe rash involving mucus membranes or skin necrosis:Unknown Has patient had a PCN reaction that required hospitalization: No Has patient had a PCN reaction occurring within the last 10 years: No If all of the above answers are "NO", then may proceed with Cephalosporin use.    Sulfamethoxazole Hives   Sulfonamide Derivatives Hives   Dilaudid [Hydromorphone Hcl] Nausea And Vomiting   Prior to Admission medications   Medication Sig Start Date End Date Taking? Authorizing Provider  acetaminophen (TYLENOL) 500 MG tablet Take 1,000 mg by mouth every 8 (eight) hours as needed for moderate pain or mild pain.   Yes [provider]  albuterol (PROAIR HFA) 108 (90 Base) MCG/ACT inhaler INHALE 2 PUFFS EVERY 4  HOURS AS NEEDED FOR  WHEEZING OR SHORTNESS OF  BREATH 07/21/21  Yes Nelwyn Salisbury, MD  aspirin EC 81 MG tablet Take 162 mg by mouth daily.   Yes [provider]  bisacodyl (DULCOLAX) 5 MG EC tablet Take 5 mg by mouth at bedtime.   Yes [provider]  buPROPion (WELLBUTRIN XL) 300 MG 24 hr tablet TAKE 1 TABLET BY MOUTH  DAILY 05/20/21  Yes Nelwyn Salisbury, MD  Cholecalciferol 50 MCG (2000 UT) TABS Take 2,000 Units by mouth daily.   Yes [provider]  clindamycin (CLEOCIN) 300 MG capsule Take 600 mg by mouth once. 08/18/20  Yes [provider]  clonazePAM (KLONOPIN) 1 MG tablet TAKE 1 TABLET BY MOUTH  TWICE DAILY AS NEEDED FOR  ANXIETY Patient taking  differently: Take 1 mg by mouth at bedtime. 04/21/21  Yes Nelwyn Salisbury, MD  diltiazem (CARDIZEM CD) 240 MG 24 hr capsule Take 240 mg by mouth daily. 03/12/21  Yes [provider]  diltiazem 2 % GEL Apply 1 application. topically  daily as needed (Anal fissuresanal). prn   Yes [provider]  docusate sodium (COLACE) 100 MG capsule Take 100 mg by mouth at bedtime.   Yes [provider]  Fluticasone-Salmeterol (ADVAIR DISKUS) 250-50 MCG/DOSE AEPB Inhale 1 puff into the lungs in the morning and at bedtime. Patient taking differently: Inhale 1 puff into the lungs daily as needed (Asthma/Breathing). 11/01/19  Yes Olalere, Adewale A, MD  furosemide (LASIX) 20 MG tablet TAKE 1 TABLET BY MOUTH  DAILY AS NEEDED Patient taking differently: Take 20 mg by mouth daily as needed for fluid or edema. 06/17/21  Yes Josph Macho, MD  GAMMA AMINOBUTYRIC ACID PO Take 750 mg by mouth daily. GABA   Yes [provider]  hydroxypropyl methylcellulose / hypromellose (ISOPTO TEARS / GONIOVISC) 2.5 % ophthalmic solution Place 1-2 drops into both eyes 3 (three) times daily as needed for dry eyes ((SCHEDULED EACH MORNING)).   Yes [provider]  Melatonin 3 MG TBDP Take 3 mg by mouth at bedtime.   Yes [provider]  mirtazapine (REMERON) 30 MG tablet Take 1 tablet (30 mg total) by mouth at bedtime. Patient taking differently: Take 15 mg by mouth at bedtime. 09/02/21  Yes Nelwyn Salisbury, MD  prazosin (MINIPRESS) 2 MG capsule Take 1 capsule (2 mg total) by mouth at bedtime. 07/21/21  Yes Nelwyn Salisbury, MD  psyllium (METAMUCIL) 58.6 % packet Take 1 packet by mouth at bedtime.   Yes [provider]  tadalafil (CIALIS) 5 MG tablet Take 1 tablet (5 mg total) by mouth daily as needed for erectile dysfunction. Patient taking differently: Take 5 mg by mouth at bedtime. 05/30/19  Yes Nelwyn Salisbury, MD  tamsulosin (FLOMAX) 0.4 MG CAPS capsule Take 0.4 mg by mouth daily.  04/14/21  Yes [provider]  terazosin (HYTRIN) 1 MG capsule Take 1 mg by mouth at bedtime.   Yes [provider]  gabapentin (NEURONTIN) 300 MG capsule Take 1 capsule (300 mg total) by mouth at bedtime. Patient not taking: Reported on 09/09/2021 09/02/21   Nelwyn Salisbury, MD  XARELTO 20 MG TABS tablet TAKE ONE-HALF TABLET BY  MOUTH DAILY 09/14/21   Josph Macho, MD     Positive ROS: None  All other systems have been reviewed and were otherwise negative with the exception of those mentioned in the HPI and as above.  Physical Exam: There were no vitals filed for this visit.  General: Alert, no acute distress Cardiovascular: No pedal edema Respiratory: No cyanosis, no use of accessory musculature GI: No organomegaly, abdomen is soft and non-tender Skin: No lesions in the area of chief complaint Neurologic: Sensation intact distally Psychiatric: Patient is competent for consent with normal mood and affect Lymphatic: No axillary or cervical lymphadenopathy  MUSCULOSKELETAL: Left knee: Painful range of motion.  Limited range of motion.  Tender to palpation over the medial joint line.  Positive McMurray.  No instability.  Assessment/Plan: LEFT KNEE MEDIAL MENISCAL TEAR Plan for Procedure(s): ARTHROSCOPY KNEE  The risks benefits and alternatives were discussed with the patient including but not limited to the risks of nonoperative treatment, versus surgical intervention including infection, bleeding, nerve injury, malunion, nonunion, hardware prominence, hardware failure, need for hardware removal, blood clots, cardiopulmonary complications, morbidity, mortality, among others, and they were willing to proceed.  Predicted outcome is good, although there will be at least a six to nine month expected recovery.  Harvie Junior, MD 09/18/2021 7:34 AM

## 2021-09-19 ENCOUNTER — Encounter (HOSPITAL_COMMUNITY): Payer: Self-pay | Admitting: Orthopedic Surgery

## 2021-09-22 ENCOUNTER — Inpatient Hospital Stay (HOSPITAL_BASED_OUTPATIENT_CLINIC_OR_DEPARTMENT_OTHER): Payer: Medicare Other | Admitting: Hematology & Oncology

## 2021-09-22 ENCOUNTER — Encounter: Payer: Self-pay | Admitting: Family Medicine

## 2021-09-22 ENCOUNTER — Encounter: Payer: Self-pay | Admitting: Hematology & Oncology

## 2021-09-22 ENCOUNTER — Other Ambulatory Visit: Payer: Self-pay

## 2021-09-22 ENCOUNTER — Inpatient Hospital Stay: Payer: Medicare Other | Attending: Hematology & Oncology

## 2021-09-22 VITALS — BP 143/79 | HR 83 | Temp 98.1°F | Resp 18 | Ht 72.0 in | Wt 261.0 lb

## 2021-09-22 DIAGNOSIS — I82891 Chronic embolism and thrombosis of other specified veins: Secondary | ICD-10-CM | POA: Insufficient documentation

## 2021-09-22 DIAGNOSIS — I82401 Acute embolism and thrombosis of unspecified deep veins of right lower extremity: Secondary | ICD-10-CM

## 2021-09-22 DIAGNOSIS — M1711 Unilateral primary osteoarthritis, right knee: Secondary | ICD-10-CM

## 2021-09-22 DIAGNOSIS — Z7982 Long term (current) use of aspirin: Secondary | ICD-10-CM | POA: Insufficient documentation

## 2021-09-22 DIAGNOSIS — S83242A Other tear of medial meniscus, current injury, left knee, initial encounter: Secondary | ICD-10-CM

## 2021-09-22 DIAGNOSIS — I82531 Chronic embolism and thrombosis of right popliteal vein: Secondary | ICD-10-CM | POA: Insufficient documentation

## 2021-09-22 DIAGNOSIS — Z7901 Long term (current) use of anticoagulants: Secondary | ICD-10-CM | POA: Diagnosis not present

## 2021-09-22 DIAGNOSIS — I82402 Acute embolism and thrombosis of unspecified deep veins of left lower extremity: Secondary | ICD-10-CM | POA: Diagnosis not present

## 2021-09-22 DIAGNOSIS — I82511 Chronic embolism and thrombosis of right femoral vein: Secondary | ICD-10-CM | POA: Diagnosis not present

## 2021-09-22 DIAGNOSIS — N289 Disorder of kidney and ureter, unspecified: Secondary | ICD-10-CM | POA: Insufficient documentation

## 2021-09-22 DIAGNOSIS — D510 Vitamin B12 deficiency anemia due to intrinsic factor deficiency: Secondary | ICD-10-CM

## 2021-09-22 LAB — CMP (CANCER CENTER ONLY)
ALT: 19 U/L (ref 0–44)
AST: 15 U/L (ref 15–41)
Albumin: 4.1 g/dL (ref 3.5–5.0)
Alkaline Phosphatase: 81 U/L (ref 38–126)
Anion gap: 6 (ref 5–15)
BUN: 22 mg/dL (ref 8–23)
CO2: 28 mmol/L (ref 22–32)
Calcium: 9.4 mg/dL (ref 8.9–10.3)
Chloride: 107 mmol/L (ref 98–111)
Creatinine: 1.76 mg/dL — ABNORMAL HIGH (ref 0.61–1.24)
GFR, Estimated: 41 mL/min — ABNORMAL LOW (ref >60)
Glucose, Bld: 85 mg/dL (ref 70–99)
Potassium: 4 mmol/L (ref 3.5–5.1)
Sodium: 141 mmol/L (ref 135–145)
Total Bilirubin: 0.5 mg/dL (ref 0.3–1.2)
Total Protein: 6.5 g/dL (ref 6.5–8.1)

## 2021-09-22 LAB — CBC WITH DIFFERENTIAL (CANCER CENTER ONLY)
Abs Immature Granulocytes: 0.14 10*3/uL — ABNORMAL HIGH (ref 0.00–0.07)
Basophils Absolute: 0.1 10*3/uL (ref 0.0–0.1)
Basophils Relative: 1 %
Eosinophils Absolute: 0.2 10*3/uL (ref 0.0–0.5)
Eosinophils Relative: 4 %
HCT: 44.7 % (ref 39.0–52.0)
Hemoglobin: 14.9 g/dL (ref 13.0–17.0)
Immature Granulocytes: 2 %
Lymphocytes Relative: 22 %
Lymphs Abs: 1.4 10*3/uL (ref 0.7–4.0)
MCH: 31.4 pg (ref 26.0–34.0)
MCHC: 33.3 g/dL (ref 30.0–36.0)
MCV: 94.3 fL (ref 80.0–100.0)
Monocytes Absolute: 0.5 10*3/uL (ref 0.1–1.0)
Monocytes Relative: 7 %
Neutro Abs: 4.1 10*3/uL (ref 1.7–7.7)
Neutrophils Relative %: 64 %
Platelet Count: 178 10*3/uL (ref 150–400)
RBC: 4.74 MIL/uL (ref 4.22–5.81)
RDW: 12.2 % (ref 11.5–15.5)
WBC Count: 6.4 10*3/uL (ref 4.0–10.5)
nRBC: 0 % (ref 0.0–0.2)

## 2021-09-22 LAB — D-DIMER, QUANTITATIVE: D-Dimer, Quant: 0.48 ug/mL-FEU (ref 0.00–0.50)

## 2021-09-22 LAB — VITAMIN B12: Vitamin B-12: 470 pg/mL (ref 180–914)

## 2021-09-22 NOTE — Progress Notes (Signed)
?Hematology and Oncology Follow Up Visit ? ?Joseph Tran ?[id number] ?December 31, 1950 71 y.o. ?09/22/2021 ? ? ?Principle Diagnosis:  ?Thromboembolic disease of the right leg ?Renal insufficiency-atrophied left kidney ?Vitamin B12 deficiency ?  ?Current Therapy:        ?Xarelto 10 mg daily along with 2 baby aspirin daily ?  ?Vitamin B12 1000 mcg IM monthly week ? ?Interim History:  Joseph Tran is here today for follow-up.  He had surgery on his left knee about 3 weeks ago.  He actually looks quite good.  Very impressed with his recovery.  He had meniscus surgery.  He had no problems with bleeding.  There is no problems with blood clots. ? ?He does have some swelling in the legs.  I told him to double up his Lasix for 3 days in a row. ? ?He has had no cough or shortness of breath.  He has had no nausea or vomiting.  He is going to the bathroom without difficulty. ? ?He has had no fever.  He has had no problems with COVID.  Para he does have the renal insufficiency.  I told him that it really is not much of an issue from my point of view.  It may be stage III kidney disease but again a lot of people have stage III kidney disease and just live with it. ? ?Currently, I would say his performance status is probably ECOG 1. ? ? ?Medications:  ?Allergies as of 09/22/2021   ? ?   Reactions  ? Allopurinol Other (See Comments)  ? PATIENT PREFERENCE ?Pt refused due to mom developing steven johnson syndrome.  ? Penicillins Hives  ? Has patient had a PCN reaction causing immediate rash, facial/tongue/throat swelling, SOB or lightheadedness with hypotension: Unknown ?Has patient had a PCN reaction causing severe rash involving mucus membranes or skin necrosis:Unknown ?Has patient had a PCN reaction that required hospitalization: No ?Has patient had a PCN reaction occurring within the last 10 years: No ?If all of the above answers are "NO", then may proceed with Cephalosporin use.  ? Sulfamethoxazole Hives  ? Sulfonamide Derivatives Hives   ? Dilaudid [hydromorphone Hcl] Nausea And Vomiting  ? ?  ? ?  ?Medication List  ?  ? ?  ? Accurate as of September 22, 2021 11:09 AM. If you have any questions, ask your nurse or doctor.  ?  ?  ? ?  ? ?acetaminophen 500 MG tablet ?Commonly known as: TYLENOL ?Take 1,000 mg by mouth every 8 (eight) hours as needed for moderate pain or mild pain. ?  ?albuterol 108 (90 Base) MCG/ACT inhaler ?Commonly known as: ProAir HFA ?INHALE 2 PUFFS EVERY 4  HOURS AS NEEDED FOR  WHEEZING OR SHORTNESS OF  BREATH ?  ?aspirin EC 81 MG tablet ?Take 162 mg by mouth daily. ?  ?bisacodyl 5 MG EC tablet ?Commonly known as: DULCOLAX ?Take 5 mg by mouth at bedtime. ?  ?buPROPion 300 MG 24 hr tablet ?Commonly known as: WELLBUTRIN XL ?TAKE 1 TABLET BY MOUTH  DAILY ?  ?Cholecalciferol 50 MCG (2000 UT) Tabs ?Take 2,000 Units by mouth daily. ?  ?clindamycin 300 MG capsule ?Commonly known as: CLEOCIN ?Take 600 mg by mouth once. ?  ?clonazePAM 1 MG tablet ?Commonly known as: KLONOPIN ?TAKE 1 TABLET BY MOUTH  TWICE DAILY AS NEEDED FOR  ANXIETY ?What changed:  ?when to take this ?additional instructions ?  ?diltiazem 2 % Gel ?Apply 1 application. topically daily as needed (Anal fissuresanal). prn ?  ?  diltiazem 240 MG 24 hr capsule ?Commonly known as: CARDIZEM CD ?Take 240 mg by mouth daily. ?  ?docusate sodium 100 MG capsule ?Commonly known as: COLACE ?Take 100 mg by mouth at bedtime. ?  ?Fluticasone-Salmeterol 250-50 MCG/DOSE Aepb ?Commonly known as: Advair Diskus ?Inhale 1 puff into the lungs in the morning and at bedtime. ?  ?furosemide 20 MG tablet ?Commonly known as: LASIX ?TAKE 1 TABLET BY MOUTH  DAILY AS NEEDED ?  ?GAMMA AMINOBUTYRIC ACID PO ?Take 750 mg by mouth daily. GABA ?  ?HYDROcodone-acetaminophen 5-325 MG tablet ?Commonly known as: Norco ?Take 1-2 tablets by mouth every 6 (six) hours as needed for moderate pain. ?  ?hydroxypropyl methylcellulose / hypromellose 2.5 % ophthalmic solution ?Commonly known as: ISOPTO TEARS / GONIOVISC ?Place  1-2 drops into both eyes 3 (three) times daily as needed for dry eyes ((SCHEDULED EACH MORNING)). ?  ?Melatonin 3 MG Tbdp ?Take 3 mg by mouth at bedtime. ?  ?mirtazapine 30 MG tablet ?Commonly known as: REMERON ?Take 1 tablet (30 mg total) by mouth at bedtime. ?What changed: how much to take ?  ?prazosin 2 MG capsule ?Commonly known as: MINIPRESS ?Take 1 capsule (2 mg total) by mouth at bedtime. ?  ?psyllium 58.6 % packet ?Commonly known as: METAMUCIL ?Take 1 packet by mouth at bedtime. ?  ?tadalafil 5 MG tablet ?Commonly known as: Cialis ?Take 1 tablet (5 mg total) by mouth daily as needed for erectile dysfunction. ?What changed: when to take this ?  ?tamsulosin 0.4 MG Caps capsule ?Commonly known as: FLOMAX ?Take 0.4 mg by mouth daily. ?  ?terazosin 1 MG capsule ?Commonly known as: HYTRIN ?Take 1 mg by mouth at bedtime. ?  ?Xarelto 20 MG Tabs tablet ?Generic drug: rivaroxaban ?TAKE ONE-HALF TABLET BY  MOUTH DAILY ?  ? ?  ? ? ?Allergies:  ?Allergies  ?Allergen Reactions  ? Allopurinol Other (See Comments)  ?  PATIENT PREFERENCE ?Pt refused due to mom developing steven johnson syndrome.  ? Penicillins Hives  ?  Has patient had a PCN reaction causing immediate rash, facial/tongue/throat swelling, SOB or lightheadedness with hypotension: Unknown ?Has patient had a PCN reaction causing severe rash involving mucus membranes or skin necrosis:Unknown ?Has patient had a PCN reaction that required hospitalization: No ?Has patient had a PCN reaction occurring within the last 10 years: No ?If all of the above answers are "NO", then may proceed with Cephalosporin use. ?  ? Sulfamethoxazole Hives  ? Sulfonamide Derivatives Hives  ? Dilaudid [Hydromorphone Hcl] Nausea And Vomiting  ? ? ?Past Medical History, Surgical history, Social history, and Family History were reviewed and updated. ? ?Review of Systems: ?Review of Systems  ?Constitutional: Negative.   ?HENT: Negative.    ?Eyes: Negative.   ?Respiratory: Negative.     ?Cardiovascular: Negative.   ?Gastrointestinal: Negative.   ?Genitourinary: Negative.   ?Musculoskeletal: Negative.   ?Skin: Negative.   ?Neurological: Negative.   ?Endo/Heme/Allergies: Negative.   ?Psychiatric/Behavioral: Negative.    ? ? ?Physical Exam: ? height is 6' (1.829 m) and weight is 261 lb (118.4 kg). His oral temperature is 98.1 ?F (36.7 ?C). His blood pressure is 143/79 (abnormal) and his pulse is 83. His respiration is 18 and oxygen saturation is 95%.  ? ?Wt Readings from Last 3 Encounters:  ?09/22/21 261 lb (118.4 kg)  ?09/18/21 259 lb (117.5 kg)  ?09/11/21 259 lb (117.5 kg)  ? ? ?Physical Exam ?Vitals reviewed.  ?HENT:  ?   Head: Normocephalic and atraumatic.  ?  Eyes:  ?   Pupils: Pupils are equal, round, and reactive to light.  ?Cardiovascular:  ?   Rate and Rhythm: Normal rate and regular rhythm.  ?   Heart sounds: Normal heart sounds.  ?Pulmonary:  ?   Effort: Pulmonary effort is normal.  ?   Breath sounds: Normal breath sounds.  ?Abdominal:  ?   General: Bowel sounds are normal.  ?   Palpations: Abdomen is soft.  ?Musculoskeletal:     ?   General: No tenderness or deformity. Normal range of motion.  ?   Cervical back: Normal range of motion.  ?Lymphadenopathy:  ?   Cervical: No cervical adenopathy.  ?Skin: ?   General: Skin is warm and dry.  ?   Findings: No erythema or rash.  ?Neurological:  ?   Mental Status: He is alert and oriented to person, place, and time.  ?Psychiatric:     ?   Behavior: Behavior normal.     ?   Thought Content: Thought content normal.     ?   Judgment: Judgment normal.  ? ? ? ?Lab Results  ?Component Value Date  ? WBC 6.4 09/22/2021  ? HGB 14.9 09/22/2021  ? HCT 44.7 09/22/2021  ? MCV 94.3 09/22/2021  ? PLT 178 09/22/2021  ? ?Lab Results  ?Component Value Date  ? FERRITIN 116 03/31/2017  ? IRON 101 03/31/2017  ? TIBC 286 03/31/2017  ? UIBC 185 03/31/2017  ? IRONPCTSAT 35 03/31/2017  ? ?Lab Results  ?Component Value Date  ? RBC 4.74 09/22/2021  ? ?No results found for:  KPAFRELGTCHN, LAMBDASER, KAPLAMBRATIO ?No results found for: IGGSERUM, IGA, IGMSERUM ?No results found for: TOTALPROTELP, ALBUMINELP, A1GS, A2GS, BETS, BETA2SER, GAMS, MSPIKE, SPEI ?  Chemistry   ?   ?Component V

## 2021-09-23 MED ORDER — DESVENLAFAXINE SUCCINATE ER 50 MG PO TB24: 50.0000 mg | ORAL_TABLET | Freq: Every day | ORAL | 3 refills | Status: DC

## 2021-09-23 NOTE — Telephone Encounter (Signed)
I understand. I sent a refill for the Desvenlafaxine to [facility]  ?

## 2021-09-28 DIAGNOSIS — S83222D Peripheral tear of medial meniscus, current injury, left knee, subsequent encounter: Secondary | ICD-10-CM | POA: Diagnosis not present

## 2021-09-28 DIAGNOSIS — S83262D Peripheral tear of lateral meniscus, current injury, left knee, subsequent encounter: Secondary | ICD-10-CM | POA: Diagnosis not present

## 2021-09-28 DIAGNOSIS — M2242 Chondromalacia patellae, left knee: Secondary | ICD-10-CM | POA: Diagnosis not present

## 2021-10-01 DIAGNOSIS — N183 Chronic kidney disease, stage 3 unspecified: Secondary | ICD-10-CM | POA: Diagnosis not present

## 2021-10-01 DIAGNOSIS — I10 Essential (primary) hypertension: Secondary | ICD-10-CM | POA: Diagnosis not present

## 2021-10-05 ENCOUNTER — Ambulatory Visit (INDEPENDENT_AMBULATORY_CARE_PROVIDER_SITE_OTHER): Payer: Medicare Other

## 2021-10-05 DIAGNOSIS — E538 Deficiency of other specified B group vitamins: Secondary | ICD-10-CM

## 2021-10-05 MED ORDER — CYANOCOBALAMIN 1000 MCG/ML IJ SOLN
1000.0000 ug | Freq: Once | INTRAMUSCULAR | Status: AC
  Administered 2021-10-05: 1000 ug via INTRAMUSCULAR

## 2021-10-05 NOTE — Progress Notes (Signed)
Per orders of Fry, Stephen A, MD, injection of B12 given in  left  deltoid by Ryin Schillo D Davontae Prusinski. Patient tolerated injection well.  Lab Results  Component Value Date   VITAMINB12 470 09/22/2021      

## 2021-10-08 ENCOUNTER — Telehealth: Payer: Self-pay | Admitting: Family Medicine

## 2021-10-08 MED ORDER — PRAZOSIN HCL 2 MG PO CAPS: 2.0000 mg | ORAL_CAPSULE | Freq: Every day | ORAL | 1 refills | Status: DC

## 2021-10-08 NOTE — Telephone Encounter (Signed)
Patient called in requesting a medication refill for prazosin (MINIPRESS) 2 MG capsule [[id number]]  to be sent to his pharmacy. ? ?Pharmacy: [facility] ([facility] ) - [place], [place] ? ?Please advise. ?

## 2021-10-08 NOTE — Telephone Encounter (Signed)
Rx done. 

## 2021-10-09 DIAGNOSIS — Z20822 Contact with and (suspected) exposure to covid-19: Secondary | ICD-10-CM | POA: Diagnosis not present

## 2021-10-13 DIAGNOSIS — Z9889 Other specified postprocedural states: Secondary | ICD-10-CM | POA: Diagnosis not present

## 2021-10-13 DIAGNOSIS — M25462 Effusion, left knee: Secondary | ICD-10-CM | POA: Diagnosis not present

## 2021-10-19 ENCOUNTER — Telehealth: Payer: Self-pay | Admitting: Pharmacist

## 2021-10-19 NOTE — Chronic Care Management (AMB) (Signed)
? ? ?Chronic Care Management ?Pharmacy Assistant  ? ?Name: Joseph Tran  MRN: [id number] DOB: 1951-06-10 ? ?Reason for Encounter: Disease State / Hypertension Assessment Call ?  ?Conditions to be addressed/monitored: ?HTN ? ?Recent office visits:  ?07/21/2021 [name] MD - Patient was seen for nightmares and additional issues. Started Prazosin 2 mg at bedtime. Discontinued Finasteride and Tramadol. No follow up noted.  ? ?Recent consult visits:  ?10/01/2021 [name] MD (nephrology) - Patient had office visit and labs. No other chart notes.  ? ?09/22/2021 [name] MD (oncology) - Patient was seen for primary osteoarthritis of right knee and additional issues. No medication changes. Follow up 3 months.  ? ?08/31/2021 Dorna Leitz (orthopedic) - Patient was seen for pain in left knee. No other chart notes.  ? ?Hospital visits:  ?None ? ?Medications: ?Outpatient Encounter Medications as of 10/19/2021  ?Medication Sig Note  ? acetaminophen (TYLENOL) 500 MG tablet Take 1,000 mg by mouth every 8 (eight) hours as needed for moderate pain or mild pain. (Patient not taking: Reported on 09/22/2021)   ? albuterol (PROAIR HFA) 108 (90 Base) MCG/ACT inhaler INHALE 2 PUFFS EVERY 4  HOURS AS NEEDED FOR  WHEEZING OR SHORTNESS OF  BREATH (Patient not taking: Reported on 09/22/2021)   ? aspirin EC 81 MG tablet Take 162 mg by mouth daily.   ? bisacodyl (DULCOLAX) 5 MG EC tablet Take 5 mg by mouth at bedtime.   ? buPROPion (WELLBUTRIN XL) 300 MG 24 hr tablet TAKE 1 TABLET BY MOUTH  DAILY   ? Cholecalciferol 50 MCG (2000 UT) TABS Take 2,000 Units by mouth daily.   ? clindamycin (CLEOCIN) 300 MG capsule Take 600 mg by mouth once. (Patient not taking: Reported on 09/22/2021) 09/09/2021: Dental appointment only  ? clonazePAM (KLONOPIN) 1 MG tablet TAKE 1 TABLET BY MOUTH  TWICE DAILY AS NEEDED FOR  ANXIETY (Patient taking differently: Take 1 mg by mouth at bedtime.)   ? desvenlafaxine (PRISTIQ) 50 MG 24 hr tablet Take 1 tablet (50 mg  total) by mouth daily.   ? diltiazem (CARDIZEM CD) 240 MG 24 hr capsule Take 240 mg by mouth daily.   ? diltiazem 2 % GEL Apply 1 application. topically daily as needed (Anal fissuresanal). prn   ? docusate sodium (COLACE) 100 MG capsule Take 100 mg by mouth at bedtime.   ? Fluticasone-Salmeterol (ADVAIR DISKUS) 250-50 MCG/DOSE AEPB Inhale 1 puff into the lungs in the morning and at bedtime. (Patient not taking: Reported on 09/22/2021)   ? furosemide (LASIX) 20 MG tablet TAKE 1 TABLET BY MOUTH  DAILY AS NEEDED (Patient not taking: Reported on 09/22/2021)   ? GAMMA AMINOBUTYRIC ACID PO Take 750 mg by mouth daily. GABA   ? HYDROcodone-acetaminophen (NORCO) 5-325 MG tablet Take 1-2 tablets by mouth every 6 (six) hours as needed for moderate pain.   ? hydroxypropyl methylcellulose / hypromellose (ISOPTO TEARS / GONIOVISC) 2.5 % ophthalmic solution Place 1-2 drops into both eyes 3 (three) times daily as needed for dry eyes ((SCHEDULED EACH MORNING)).   ? Melatonin 3 MG TBDP Take 3 mg by mouth at bedtime.   ? prazosin (MINIPRESS) 2 MG capsule Take 1 capsule (2 mg total) by mouth at bedtime.   ? psyllium (METAMUCIL) 58.6 % packet Take 1 packet by mouth at bedtime.   ? tadalafil (CIALIS) 5 MG tablet Take 1 tablet (5 mg total) by mouth daily as needed for erectile dysfunction. (Patient taking differently: Take 5 mg by mouth  at bedtime.)   ? tamsulosin (FLOMAX) 0.4 MG CAPS capsule Take 0.4 mg by mouth daily.   ? terazosin (HYTRIN) 1 MG capsule Take 1 mg by mouth at bedtime.   ? XARELTO 20 MG TABS tablet TAKE ONE-HALF TABLET BY  MOUTH DAILY   ? ?No facility-administered encounter medications on file as of 10/19/2021.  ?Fill History: ?ALBUTEROL SULFATE HFA  108 (90 Base) MCG/ACT AERS 07/21/2021 90  ? ?DESVENLAFAXINE ER  50 MG TB24 09/23/2021 90  ? ?GABAPENTIN '300MG'$  CAPSULES 09/02/2021 30  ? ?PRAZOSIN '2MG'$  CAPSULES 07/21/2021 90  ? ?XARELTO  20 MG TABS 07/13/2021 90  ? ?TAMSULOSIN HYDROCHLORIDE  0.4 MG CAPS 10/07/2021 90   ? ?BUPROPION HYDROCHLORIDE ER (XL)  300 MG TB24 10/07/2021 90  ? ?DILTIAZEM HYDROCHLORIDE ER  240 MG CP24 10/07/2021 90  ? ?Reviewed chart prior to disease state call. Spoke with patient regarding BP ? ?Recent Office Vitals: ?BP Readings from Last 3 Encounters:  ?09/22/21 (!) 143/79  ?09/18/21 132/81  ?09/11/21 (!) 176/94  ? ?Pulse Readings from Last 3 Encounters:  ?09/22/21 83  ?09/18/21 62  ?09/11/21 78  ?  ?Wt Readings from Last 3 Encounters:  ?09/22/21 261 lb (118.4 kg)  ?09/18/21 259 lb (117.5 kg)  ?09/11/21 259 lb (117.5 kg)  ?  ? ?Kidney Function ?Lab Results  ?Component Value Date/Time  ? CREATININE 1.76 (H) 09/22/2021 10:04 AM  ? CREATININE 1.54 (H) 09/11/2021 10:30 AM  ? CREATININE 1.74 (H) 07/21/2021 08:41 AM  ? CREATININE 1.87 (H) 04/16/2021 02:36 PM  ? CREATININE 1.56 (H) 06/04/2020 09:50 AM  ? CREATININE 2.1 (H) 06/29/2017 02:52 PM  ? GFR 39.34 (L) 07/21/2021 08:41 AM  ? GFRNONAA 41 (L) 09/22/2021 10:04 AM  ? GFRNONAA 45 (L) 06/04/2020 09:50 AM  ? GFRAA 52 (L) 06/04/2020 09:50 AM  ? ? ? ?  Latest Ref Rng & Units 09/22/2021  ? 10:04 AM 09/11/2021  ? 10:30 AM 07/21/2021  ?  8:41 AM  ?BMP  ?Glucose 70 - 99 mg/dL 85   106   95    ?BUN 8 - 23 mg/dL '22   23   26    '$ ?Creatinine 0.61 - 1.24 mg/dL 1.76   1.54   1.74    ?Sodium 135 - 145 mmol/L 141   140   142    ?Potassium 3.5 - 5.1 mmol/L 4.0   3.9   4.0    ?Chloride 98 - 111 mmol/L 107   112   108    ?CO2 22 - 32 mmol/L '28   22   27    '$ ?Calcium 8.9 - 10.3 mg/dL 9.4   8.9   9.2    ? ? ?Current antihypertensive regimen:  ?Diltazem 240 mg daily ? ?How often are you checking your Blood Pressure? Patient checks his blood pressure 2-3 times per week  ? ?Current home BP readings: Patients recent blood pressures have been between 140/80 - 145/80 ? ?What recent interventions/DTPs have been made by any provider to improve Blood Pressure control since last CPP Visit: No recent interventions  ? ?Any recent hospitalizations or ED visits since last visit with CPP? No recent  hospital visits.  ? ?What diet changes have been made to improve Blood Pressure Control?  ?Patient follows a low sodium and low carbohydrate ?Breakfast - patient doesn't eat breakfast ?Lunch - patient will have a salad without meat ?Dinner - patient will have kale salad without meat ?Snack - patient will have a variety of vegetables  with a dressing ?Patient states the only meat he has is if he is running short of time and he will then have a burger.  ? ?What exercise is being done to improve your Blood Pressure Control?  ?Patient shows horses daily and stays active.  ? ?Adherence Review: ?Is the patient currently on ACE/ARB medication? No ?Does the patient have >5 day gap between last estimated fill dates? No ? ?Care Gaps: ?AWV - completed 02/20/2021 ?Last BP - 143/79 on 09/22/2021 ?Hepatitis C Screen - never done ?  ?Star Rating Drug: ?None ? ?Gennie Alma CMA  ?Clinical Pharmacist Assistant ?[phone] ? ?

## 2021-10-20 ENCOUNTER — Encounter: Payer: Self-pay | Admitting: Family Medicine

## 2021-10-26 ENCOUNTER — Other Ambulatory Visit: Payer: Self-pay

## 2021-10-26 DIAGNOSIS — J4520 Mild intermittent asthma, uncomplicated: Secondary | ICD-10-CM

## 2021-10-26 MED ORDER — AZITHROMYCIN 250 MG PO TABS: ORAL_TABLET | ORAL | 0 refills | Status: AC

## 2021-10-26 NOTE — Telephone Encounter (Signed)
Call in a [employer]  ?

## 2021-10-27 ENCOUNTER — Other Ambulatory Visit: Payer: Self-pay | Admitting: Family Medicine

## 2021-10-29 DIAGNOSIS — M25462 Effusion, left knee: Secondary | ICD-10-CM | POA: Diagnosis not present

## 2021-11-04 ENCOUNTER — Ambulatory Visit: Payer: Medicare Other

## 2021-11-04 DIAGNOSIS — E538 Deficiency of other specified B group vitamins: Secondary | ICD-10-CM

## 2021-11-05 ENCOUNTER — Ambulatory Visit (INDEPENDENT_AMBULATORY_CARE_PROVIDER_SITE_OTHER): Payer: Medicare Other

## 2021-11-05 DIAGNOSIS — E538 Deficiency of other specified B group vitamins: Secondary | ICD-10-CM | POA: Diagnosis not present

## 2021-11-05 MED ORDER — CYANOCOBALAMIN 1000 MCG/ML IJ SOLN
1000.0000 ug | Freq: Once | INTRAMUSCULAR | Status: AC
  Administered 2021-11-05: 1000 ug via INTRAMUSCULAR

## 2021-11-05 NOTE — Progress Notes (Addendum)
Per orders of Dr. Fry, injection of Cyanocobalamin 1000 mcg given by Delrae Hagey L Eain Mullendore. °Patient tolerated injection well.  °

## 2021-11-09 DIAGNOSIS — Z20822 Contact with and (suspected) exposure to covid-19: Secondary | ICD-10-CM | POA: Diagnosis not present

## 2021-11-17 ENCOUNTER — Other Ambulatory Visit: Payer: Self-pay | Admitting: Family Medicine

## 2021-11-17 NOTE — Telephone Encounter (Signed)
Last OV- 07/21/21 ?Last refill- 04/21/21-180 tabs, 1 refill ? ?No future OV scheduled.  ?

## 2021-11-20 ENCOUNTER — Telehealth: Payer: Self-pay | Admitting: Family Medicine

## 2021-11-20 NOTE — Telephone Encounter (Signed)
Please resent Clonazepam 1 mg to [facility]. Pharmacy updated.

## 2021-11-20 NOTE — Telephone Encounter (Signed)
Pt requesting a refill of clonazePAM (KLONOPIN) 1 MG tablet to be sent to because his mail order is out of stock. Patient states it does not hve to be a full prescription (suggests 15 tablets) [id number] - [place], [place] - [address] AT [place] Phone:  [phone]  Fax:  [phone]

## 2021-11-23 NOTE — Telephone Encounter (Signed)
This was sent to [facility] on 11-18-21

## 2021-11-23 NOTE — Telephone Encounter (Signed)
Pt is going out of town on Wednesday on 11-25-2021 and [facility] not [facility]azePAM (KLONOPIN) 1 MG tablet into please sent to  [id number] - [place], [place] - [address] Phone:  [phone]  Fax:  [phone]

## 2021-11-24 MED ORDER — CLONAZEPAM 1 MG PO TABS
1.0000 mg | ORAL_TABLET | Freq: Two times a day (BID) | ORAL | 1 refills | Status: DC | PRN
Start: 2021-11-24 — End: 2022-06-02

## 2021-11-24 NOTE — Telephone Encounter (Signed)
Pt is calling and we need to [facility] to cancel clonazepam so that [facility] and fill rx

## 2021-11-24 NOTE — Telephone Encounter (Signed)
[facility], to cancel Clonazepam prescription as requested, but  Clonazepam '1mg'$  was shipped out to patient this A.M.    Called patient to make aware of current situation. Nothing further is needed.

## 2021-11-24 NOTE — Telephone Encounter (Signed)
Done

## 2021-11-25 ENCOUNTER — Telehealth: Payer: Self-pay

## 2021-11-25 NOTE — Telephone Encounter (Signed)
Received phone call from pt reporting he is having a tooth extracted on Tuesday 12/08/2021. Pt requesting instructions for holding Xarelto and asa.   Per Dr Marin Olp, pt to hold BOTH Xarelto and Aspirin for 2 days prior to procedure and resume the day after. Using teachback, the pt verbalizes understanding to hold meds on Sunday, Monday and Tuesday and resume on Wednesday. dph

## 2021-12-07 DIAGNOSIS — M25561 Pain in right knee: Secondary | ICD-10-CM | POA: Diagnosis not present

## 2021-12-14 DIAGNOSIS — Z96651 Presence of right artificial knee joint: Secondary | ICD-10-CM | POA: Diagnosis not present

## 2021-12-14 DIAGNOSIS — M25561 Pain in right knee: Secondary | ICD-10-CM | POA: Diagnosis not present

## 2021-12-14 DIAGNOSIS — M5451 Vertebrogenic low back pain: Secondary | ICD-10-CM | POA: Diagnosis not present

## 2021-12-14 DIAGNOSIS — R2243 Localized swelling, mass and lump, lower limb, bilateral: Secondary | ICD-10-CM | POA: Diagnosis not present

## 2021-12-15 ENCOUNTER — Ambulatory Visit (INDEPENDENT_AMBULATORY_CARE_PROVIDER_SITE_OTHER): Payer: Medicare Other | Admitting: *Deleted

## 2021-12-15 DIAGNOSIS — E538 Deficiency of other specified B group vitamins: Secondary | ICD-10-CM | POA: Diagnosis not present

## 2021-12-15 MED ORDER — CYANOCOBALAMIN 1000 MCG/ML IJ SOLN
1000.0000 ug | Freq: Once | INTRAMUSCULAR | Status: AC
  Administered 2021-12-15: 1000 ug via INTRAMUSCULAR

## 2021-12-15 NOTE — Progress Notes (Signed)
Per orders of Dr. Fry, injection of Cyanocobalamin 1000mcg given by Shalaunda Weatherholtz A. Patient tolerated injection well.  

## 2021-12-22 ENCOUNTER — Other Ambulatory Visit: Payer: Self-pay

## 2021-12-22 ENCOUNTER — Inpatient Hospital Stay: Payer: Medicare Other | Attending: Hematology & Oncology

## 2021-12-22 ENCOUNTER — Other Ambulatory Visit: Payer: Self-pay | Admitting: Oncology

## 2021-12-22 ENCOUNTER — Inpatient Hospital Stay (HOSPITAL_BASED_OUTPATIENT_CLINIC_OR_DEPARTMENT_OTHER): Payer: Medicare Other | Admitting: Hematology & Oncology

## 2021-12-22 ENCOUNTER — Encounter: Payer: Self-pay | Admitting: Hematology & Oncology

## 2021-12-22 VITALS — BP 133/81 | HR 65 | Temp 98.7°F | Resp 18 | Wt 256.0 lb

## 2021-12-22 DIAGNOSIS — Z7901 Long term (current) use of anticoagulants: Secondary | ICD-10-CM | POA: Diagnosis not present

## 2021-12-22 DIAGNOSIS — D510 Vitamin B12 deficiency anemia due to intrinsic factor deficiency: Secondary | ICD-10-CM

## 2021-12-22 DIAGNOSIS — S83242A Other tear of medial meniscus, current injury, left knee, initial encounter: Secondary | ICD-10-CM

## 2021-12-22 DIAGNOSIS — Z7982 Long term (current) use of aspirin: Secondary | ICD-10-CM | POA: Insufficient documentation

## 2021-12-22 DIAGNOSIS — E538 Deficiency of other specified B group vitamins: Secondary | ICD-10-CM | POA: Diagnosis not present

## 2021-12-22 DIAGNOSIS — I82401 Acute embolism and thrombosis of unspecified deep veins of right lower extremity: Secondary | ICD-10-CM | POA: Diagnosis not present

## 2021-12-22 DIAGNOSIS — N289 Disorder of kidney and ureter, unspecified: Secondary | ICD-10-CM | POA: Insufficient documentation

## 2021-12-22 DIAGNOSIS — I82402 Acute embolism and thrombosis of unspecified deep veins of left lower extremity: Secondary | ICD-10-CM

## 2021-12-22 DIAGNOSIS — I82511 Chronic embolism and thrombosis of right femoral vein: Secondary | ICD-10-CM | POA: Insufficient documentation

## 2021-12-22 DIAGNOSIS — M1711 Unilateral primary osteoarthritis, right knee: Secondary | ICD-10-CM

## 2021-12-22 LAB — CBC WITH DIFFERENTIAL (CANCER CENTER ONLY)
Abs Immature Granulocytes: 0.04 10*3/uL (ref 0.00–0.07)
Basophils Absolute: 0.1 10*3/uL (ref 0.0–0.1)
Basophils Relative: 1 %
Eosinophils Absolute: 0.2 10*3/uL (ref 0.0–0.5)
Eosinophils Relative: 4 %
HCT: 41.5 % (ref 39.0–52.0)
Hemoglobin: 13.8 g/dL (ref 13.0–17.0)
Immature Granulocytes: 1 %
Lymphocytes Relative: 19 %
Lymphs Abs: 1.1 10*3/uL (ref 0.7–4.0)
MCH: 31.2 pg (ref 26.0–34.0)
MCHC: 33.3 g/dL (ref 30.0–36.0)
MCV: 93.9 fL (ref 80.0–100.0)
Monocytes Absolute: 0.4 10*3/uL (ref 0.1–1.0)
Monocytes Relative: 8 %
Neutro Abs: 3.7 10*3/uL (ref 1.7–7.7)
Neutrophils Relative %: 67 %
Platelet Count: 163 10*3/uL (ref 150–400)
RBC: 4.42 MIL/uL (ref 4.22–5.81)
RDW: 12.4 % (ref 11.5–15.5)
WBC Count: 5.5 10*3/uL (ref 4.0–10.5)
nRBC: 0 % (ref 0.0–0.2)

## 2021-12-22 LAB — CMP (CANCER CENTER ONLY)
ALT: 10 U/L (ref 0–44)
AST: 12 U/L — ABNORMAL LOW (ref 15–41)
Albumin: 4.2 g/dL (ref 3.5–5.0)
Alkaline Phosphatase: 78 U/L (ref 38–126)
Anion gap: 7 (ref 5–15)
BUN: 28 mg/dL — ABNORMAL HIGH (ref 8–23)
CO2: 27 mmol/L (ref 22–32)
Calcium: 9.5 mg/dL (ref 8.9–10.3)
Chloride: 109 mmol/L (ref 98–111)
Creatinine: 1.91 mg/dL — ABNORMAL HIGH (ref 0.61–1.24)
GFR, Estimated: 37 mL/min — ABNORMAL LOW (ref >60)
Glucose, Bld: 79 mg/dL (ref 70–99)
Potassium: 4.2 mmol/L (ref 3.5–5.1)
Sodium: 143 mmol/L (ref 135–145)
Total Bilirubin: 0.5 mg/dL (ref 0.3–1.2)
Total Protein: 6.8 g/dL (ref 6.5–8.1)

## 2021-12-22 LAB — LACTATE DEHYDROGENASE: LDH: 167 U/L (ref 98–192)

## 2021-12-22 NOTE — Progress Notes (Signed)
Hematology and Oncology Follow Up Visit  Joseph Tran [id number] 1951/06/20 71 y.o. 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  Unfortunately, he is having some problems with his knees.  He had left knee surgery back in March.  Unfortunate, he may he is having some problems with this.  He has had problems with his right knee.  He cannot have anything done with his knees until he gets his oral situation taken care of.  He has had some problems with his mouth.  He is got have a root canal.  If this does not help, he may need to have sinus surgery.  He is also having problems with one of his horses.  One of his horses fell yesterday.  Hopefully she is not going to be lame.  Otherwise, he is doing fairly well.  He is doing okay on the Xarelto and aspirin.  He has had no problems with bleeding.  He had no problems with nausea or vomiting.  There is no change in bowel or bladder habits.  He has mild renal insufficiency.  However, this is not all that bad for him.  He has been followed by Nephrology.  He has had no rashes.  He has had no problems with COVID.  There is no cough or shortness of breath.  He has had no chest wall pain.  Overall, I would say his performance status is ECOG 1.     Medications:  Allergies as of 12/22/2021       Reactions   Allopurinol Other (See Comments)   PATIENT PREFERENCE Pt refused due to mom developing steven johnson syndrome.   Penicillins Hives   Has patient had a PCN reaction causing immediate rash, facial/tongue/throat swelling, SOB or lightheadedness with hypotension: Unknown Has patient had a PCN reaction causing severe rash involving mucus membranes or skin  necrosis:Unknown Has patient had a PCN reaction that required hospitalization: No Has patient had a PCN reaction occurring within the last 10 years: No If all of the above answers are "NO", then may proceed with Cephalosporin use.   Sulfamethoxazole Hives   Sulfonamide Derivatives Hives   Dilaudid [hydromorphone Hcl] Nausea And Vomiting        Medication List        Accurate as of December 22, 2021 10:50 AM. If you have any questions, ask your nurse or doctor.          STOP taking these medications    Melatonin 3 MG Tbdp Stopped by: Volanda Napoleon, MD       TAKE these medications    acetaminophen 500 MG tablet Commonly known as: TYLENOL Take 1,000 mg by mouth every 8 (eight) hours as needed for moderate pain or mild pain.   albuterol 108 (90 Base) MCG/ACT inhaler Commonly known as: ProAir HFA INHALE 2 PUFFS EVERY 4  HOURS AS NEEDED FOR  WHEEZING OR SHORTNESS OF  BREATH   Apple Cider Vinegar 500 MG Tabs Take by mouth.   aspirin EC 81 MG tablet Take 162 mg by mouth daily.   bisacodyl 5 MG EC tablet Commonly known as: DULCOLAX Take 5 mg by mouth at bedtime.   buPROPion 300 MG  24 hr tablet Commonly known as: WELLBUTRIN XL TAKE 1 TABLET BY MOUTH  DAILY   Cholecalciferol 50 MCG (2000 UT) Tabs Take 2,000 Units by mouth daily.   clindamycin 300 MG capsule Commonly known as: CLEOCIN Take 600 mg by mouth once.   clonazePAM 1 MG tablet Commonly known as: KLONOPIN Take 1 tablet (1 mg total) by mouth 2 (two) times daily as needed for anxiety. for anxiety   desvenlafaxine 50 MG 24 hr tablet Commonly known as: Pristiq Take 1 tablet (50 mg total) by mouth daily.   diltiazem 2 % Gel Apply 1 application. topically daily as needed (Anal fissuresanal). prn   diltiazem 240 MG 24 hr capsule Commonly known as: CARDIZEM CD Take 240 mg by mouth daily.   docusate sodium 100 MG capsule Commonly known as: COLACE Take 100 mg by mouth at bedtime.   Fluticasone-Salmeterol  250-50 MCG/DOSE Aepb Commonly known as: Advair Diskus Inhale 1 puff into the lungs in the morning and at bedtime.   furosemide 20 MG tablet Commonly known as: LASIX TAKE 1 TABLET BY MOUTH  DAILY AS NEEDED   gabapentin 300 MG capsule Commonly known as: NEURONTIN Take 300 mg by mouth daily.   GAMMA AMINOBUTYRIC ACID PO Take 750 mg by mouth daily. GABA   HYDROcodone-acetaminophen 5-325 MG tablet Commonly known as: Norco Take 1-2 tablets by mouth every 6 (six) hours as needed for moderate pain.   hydroxypropyl methylcellulose / hypromellose 2.5 % ophthalmic solution Commonly known as: ISOPTO TEARS / GONIOVISC Place 1-2 drops into both eyes 3 (three) times daily as needed for dry eyes ((SCHEDULED EACH MORNING)).   prazosin 2 MG capsule Commonly known as: MINIPRESS TAKE 1 CAPSULE(2 MG) BY MOUTH AT BEDTIME   psyllium 58.6 % packet Commonly known as: METAMUCIL Take 1 packet by mouth at bedtime.   tadalafil 5 MG tablet Commonly known as: Cialis Take 1 tablet (5 mg total) by mouth daily as needed for erectile dysfunction. What changed: when to take this   tamsulosin 0.4 MG Caps capsule Commonly known as: FLOMAX Take 0.4 mg by mouth daily.   terazosin 1 MG capsule Commonly known as: HYTRIN Take 1 mg by mouth at bedtime.   Xarelto 20 MG Tabs tablet Generic drug: rivaroxaban TAKE ONE-HALF TABLET BY  MOUTH DAILY        Allergies:  Allergies  Allergen Reactions   Allopurinol Other (See Comments)    PATIENT PREFERENCE Pt refused due to mom developing steven johnson syndrome.   Penicillins Hives    Has patient had a PCN reaction causing immediate rash, facial/tongue/throat swelling, SOB or lightheadedness with hypotension: Unknown Has patient had a PCN reaction causing severe rash involving mucus membranes or skin necrosis:Unknown Has patient had a PCN reaction that required hospitalization: No Has patient had a PCN reaction occurring within the last 10 years: No If all  of the above answers are "NO", then may proceed with Cephalosporin use.    Sulfamethoxazole Hives   Sulfonamide Derivatives Hives   Dilaudid [Hydromorphone Hcl] Nausea And Vomiting    Past Medical History, Surgical history, Social history, and Family History were reviewed and updated.  Review of Systems: Review of Systems  Constitutional: Negative.   HENT: Negative.    Eyes: Negative.   Respiratory: Negative.    Cardiovascular: Negative.   Gastrointestinal: Negative.   Genitourinary: Negative.   Musculoskeletal: Negative.   Skin: Negative.   Neurological: Negative.   Endo/Heme/Allergies: Negative.   Psychiatric/Behavioral: Negative.  Physical Exam:  weight is 256 lb (116.1 kg). His oral temperature is 98.7 F (37.1 C). His blood pressure is 133/81 and his pulse is 65. His respiration is 18 and oxygen saturation is 98%.   Wt Readings from Last 3 Encounters:  12/22/21 256 lb (116.1 kg)  09/22/21 261 lb (118.4 kg)  09/18/21 259 lb (117.5 kg)    Physical Exam Vitals reviewed.  HENT:     Head: Normocephalic and atraumatic.  Eyes:     Pupils: Pupils are equal, round, and reactive to light.  Cardiovascular:     Rate and Rhythm: Normal rate and regular rhythm.     Heart sounds: Normal heart sounds.  Pulmonary:     Effort: Pulmonary effort is normal.     Breath sounds: Normal breath sounds.  Abdominal:     General: Bowel sounds are normal.     Palpations: Abdomen is soft.  Musculoskeletal:        General: No tenderness or deformity. Normal range of motion.     Cervical back: Normal range of motion.  Lymphadenopathy:     Cervical: No cervical adenopathy.  Skin:    General: Skin is warm and dry.     Findings: No erythema or rash.  Neurological:     Mental Status: He is alert and oriented to person, place, and time.  Psychiatric:        Behavior: Behavior normal.        Thought Content: Thought content normal.        Judgment: Judgment normal.     Lab  Results  Component Value Date   WBC 5.5 12/22/2021   HGB 13.8 12/22/2021   HCT 41.5 12/22/2021   MCV 93.9 12/22/2021   PLT 163 12/22/2021   Lab Results  Component Value Date   FERRITIN 116 03/31/2017   IRON 101 03/31/2017   TIBC 286 03/31/2017   UIBC 185 03/31/2017   IRONPCTSAT 35 03/31/2017   Lab Results  Component Value Date   RBC 4.42 12/22/2021   No results found for: "KPAFRELGTCHN", "LAMBDASER", "KAPLAMBRATIO" No results found for: "IGGSERUM", "IGA", "IGMSERUM" No results found for: "TOTALPROTELP", "ALBUMINELP", "A1GS", "A2GS", "BETS", "BETA2SER", "GAMS", "MSPIKE", "SPEI"   Chemistry      Component Value Date/Time   NA 143 12/22/2021 0942   NA 145 06/29/2017 1452   K 4.2 12/22/2021 0942   K 4.7 06/29/2017 1452   CL 109 12/22/2021 0942   CL 107 06/29/2017 1452   CO2 27 12/22/2021 0942   CO2 25 06/29/2017 1452   BUN 28 (H) 12/22/2021 0942   BUN 33 (H) 06/29/2017 1452   CREATININE 1.91 (H) 12/22/2021 0942   CREATININE 1.56 (H) 06/04/2020 0950      Component Value Date/Time   CALCIUM 9.5 12/22/2021 0942   CALCIUM 9.5 06/29/2017 1452   ALKPHOS 78 12/22/2021 0942   ALKPHOS 83 06/29/2017 1452   AST 12 (L) 12/22/2021 0942   ALT 10 12/22/2021 0942   ALT 23 06/29/2017 1452   BILITOT 0.5 12/22/2021 0942       Impression and Plan: Mr. 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   He is on Xarelto.  Also on baby aspirin.  I think the 2 together is certainly a good combination for him.  Hopefully, he will not need any further knee surgery.  However, if he does,  I am sure that Orthopedic Surgery will let us know about this.  I think we can probably get him back after Labor Day.  I really do not see a problem with him from a hypercoagulable issue.  I just feel bad that he has the oral issues.  Hopefully this will be taken  care of without major sinus surgery.   Volanda Napoleon, MD 6/20/202310:50 AM

## 2021-12-30 DIAGNOSIS — L570 Actinic keratosis: Secondary | ICD-10-CM | POA: Diagnosis not present

## 2021-12-30 DIAGNOSIS — L814 Other melanin hyperpigmentation: Secondary | ICD-10-CM | POA: Diagnosis not present

## 2021-12-30 DIAGNOSIS — L821 Other seborrheic keratosis: Secondary | ICD-10-CM | POA: Diagnosis not present

## 2021-12-30 DIAGNOSIS — D225 Melanocytic nevi of trunk: Secondary | ICD-10-CM | POA: Diagnosis not present

## 2022-01-11 IMAGING — DX DG CHEST 2V
2 series · 2 of 2 positions shown · non-contrast
Comparison: January 16, 2019.

CLINICAL DATA: Cough.

EXAM:
CHEST - 2 VIEW

[chest pa]
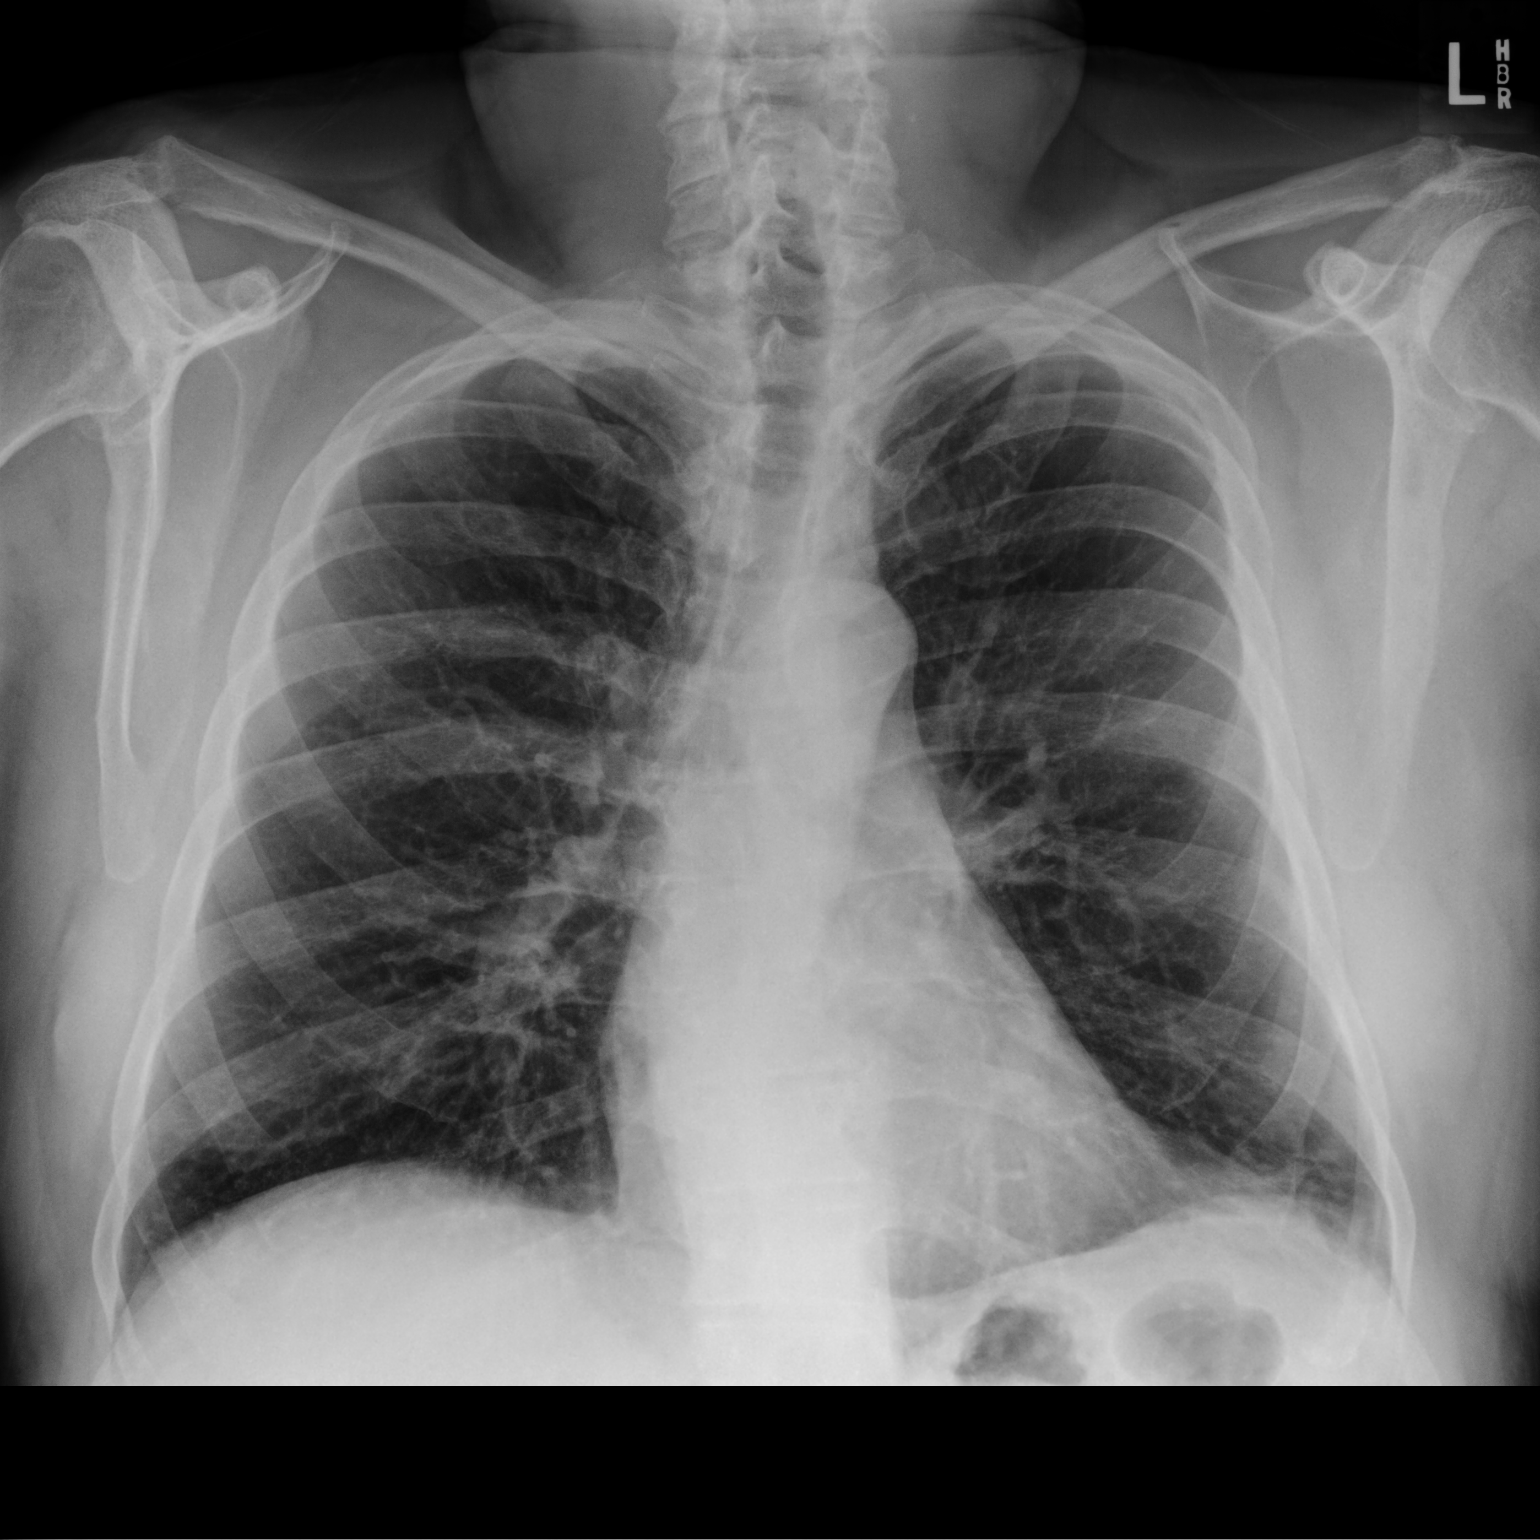

[chest lat]
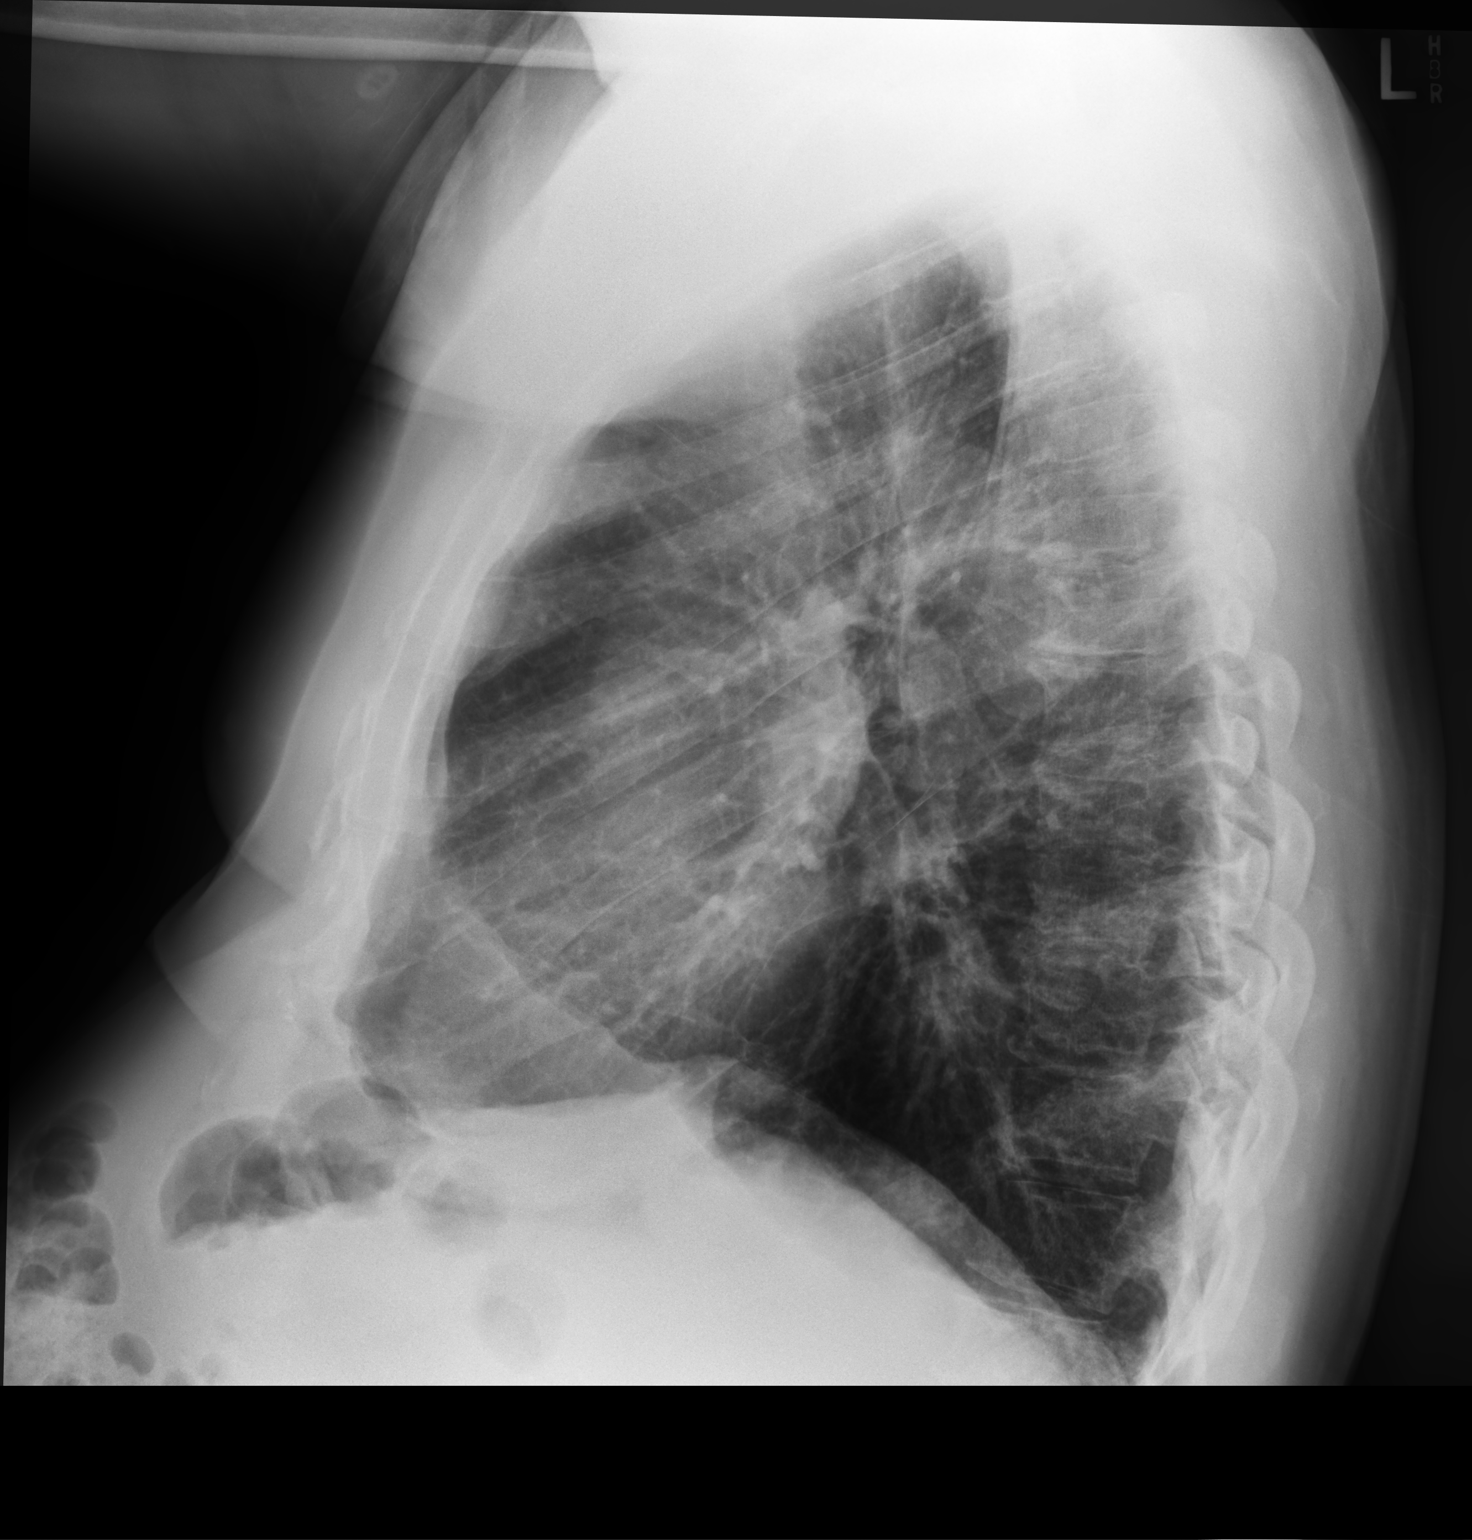

[2 of 2 positions shown; findings below may reference images not displayed]

FINDINGS: The heart size and mediastinal contours are within normal limits. No
pneumothorax or pleural effusion is noted. Right lung is clear.
Stable left basilar scarring. No acute abnormality is noted. The
visualized skeletal structures are unremarkable.
IMPRESSION: No acute cardiopulmonary abnormality seen.

## 2022-01-15 ENCOUNTER — Telehealth: Payer: Medicare Other

## 2022-01-15 ENCOUNTER — Ambulatory Visit (INDEPENDENT_AMBULATORY_CARE_PROVIDER_SITE_OTHER): Payer: Medicare Other

## 2022-01-15 DIAGNOSIS — E538 Deficiency of other specified B group vitamins: Secondary | ICD-10-CM

## 2022-01-15 MED ORDER — CYANOCOBALAMIN 1000 MCG/ML IJ SOLN
1000.0000 ug | Freq: Once | INTRAMUSCULAR | Status: AC
  Administered 2022-01-15: 1000 ug via INTRAMUSCULAR

## 2022-01-15 NOTE — Progress Notes (Signed)
Per orders of [name], MD, injection of B12 given in  left  deltoid by Franceska Strahm D Eran Windish. Patient tolerated injection well.  Lab Results  Component Value Date   [id number] 429 09/22/2021

## 2022-01-21 ENCOUNTER — Telehealth: Payer: Self-pay | Admitting: Pharmacist

## 2022-01-21 NOTE — Chronic Care Management (AMB) (Signed)
    Chronic Care Management Pharmacy Assistant   Name: Joseph Tran  MRN: [id number] DOB: [birth year]  01/22/2022 APPOINTMENT REMINDER  Called [facility], No answer, left message of appointment on 01/22/2022 at 1:00 via telephone visit with Jeni Salles, Pharm D. Notified to have all medications, supplements, blood pressure and/or blood sugar logs available during appointment and to return call if need to reschedule.  Care Gaps: AWV - message sent to Ramond Craver Last BP - 133/81 on 12/22/2021 Hep C Screen - never done Covid booster - overdue Pneumonia vaccine - postponed  Star Rating Drug: None  Any gaps in medications fill history? No  Gennie Alma [facility]  Catering manager [phone]

## 2022-01-21 NOTE — Progress Notes (Signed)
Chronic Care Management Pharmacy Note  01/28/2022 Name:  Joseph Tran MRN:  [id number] DOB:  Feb 11, 1951  Summary: Pt reports his depression is not any better Pt reports nightmares have subsided with stopping melatonin   Recommendations/Changes made from today's visit: -Recommended scheduling a visit with behavioral health to help with anxiety  -Recommended genesight testing for further direction for depression medications   Plan: Follow up after discussion with PCP/pending genesight order   Subjective: Joseph Tran is an 71 y.o. year old male who is a primary patient of [name], MD.  The CCM team was consulted for assistance with disease management and care coordination needs.    Engaged with patient by telephone for follow up visit in response to provider referral for pharmacy case management and/or care coordination services.   Consent to Services:  The patient was given information about Chronic Care Management services, agreed to services, and gave verbal consent prior to initiation of services.  Please see initial visit note for detailed documentation.   Patient Care Team: [name], MD as PCP - Kristine Garbe, Rudell Cobb, MD as Consulting Physician (Oncology) Viona Gilmore, [facility] as Pharmacist (Pharmacist)  Recent office visits: 01/15/22 Geradine Girt, CMA: Patient presented for vitamin B12 injection.  07/21/2021 [name] MD - Patient was seen for nightmares and additional issues. Started Prazosin 2 mg at bedtime. Discontinued Finasteride and Tramadol. No follow up noted.   Recent consult visits: 12/22/21 [name], MD (hem/oncology): Patient presented for DVT and CKD follow up. Removed melatonin from medication list. Follow up in 3 months for labs.  10/01/2021 [name] MD (nephrology) - Patient had office visit and labs. No other chart notes.    09/22/2021 [name] MD (oncology) - Patient was seen for primary osteoarthritis of right knee  and additional issues. No medication changes. Follow up 3 months.    08/31/2021 Dorna Leitz (orthopedic) - Patient was seen for pain in left knee. No other chart notes.   Hospital visits: 09/18/21 Patient admitted to [facility] for 4 hours for left knee arthroscopy.  Objective:  Lab Results  Component Value Date   CREATININE 1.91 (H) 12/22/2021   BUN 28 (H) 12/22/2021   GFR 39.34 (L) 07/21/2021   GFRNONAA 37 (L) 12/22/2021   GFRAA 52 (L) 06/04/2020   NA 143 12/22/2021   K 4.2 12/22/2021   CALCIUM 9.5 12/22/2021   CO2 27 12/22/2021   GLUCOSE 79 12/22/2021    Lab Results  Component Value Date/Time   HGBA1C 5.4 07/21/2021 08:41 AM   HGBA1C 5.2 01/29/2021 11:25 AM   GFR 39.34 (L) 07/21/2021 08:41 AM   GFR 43.84 (L) 05/30/2019 09:03 AM    Last diabetic Eye exam: No results found for: "HMDIABEYEEXA"  Last diabetic Foot exam: No results found for: "HMDIABFOOTEX"   Lab Results  Component Value Date   CHOL 200 07/21/2021   HDL 47.80 07/21/2021   LDLCALC 126 (H) 07/21/2021   TRIG 129.0 07/21/2021   CHOLHDL 4 07/21/2021       Latest Ref Rng & Units 12/22/2021    9:42 AM 09/22/2021   10:04 AM 07/21/2021    8:41 AM  Hepatic Function  Total Protein 6.5 - 8.1 g/dL 6.8  6.5  6.7   Albumin 3.5 - 5.0 g/dL 4.2  4.1  4.0   AST 15 - 41 U/L _0 ALT 0 - 44 U/L 10  19  16  Alk Phosphatase 38 - 126 U/L 78  81  78   Total Bilirubin 0.3 - 1.2 mg/dL 0.5  0.5  0.5   Bilirubin, Direct 0.0 - 0.3 mg/dL   0.0     Lab Results  Component Value Date/Time   TSH 2.54 07/21/2021 08:41 AM   TSH 1.40 01/29/2021 11:25 AM   FREET4 0.61 07/21/2021 08:41 AM   FREET4 0.69 01/29/2021 11:25 AM       Latest Ref Rng & Units 12/22/2021    9:42 AM 09/22/2021   10:04 AM 09/11/2021   10:30 AM  CBC  WBC 4.0 - 10.5 K/uL 5.5  6.4  5.7   Hemoglobin 13.0 - 17.0 g/dL 13.8  14.9  14.4   Hematocrit 39.0 - 52.0 % 41.5  44.7  43.4   Platelets 150 - 400 K/uL 163  178  178     No results found  for: "VD25OH"  Clinical ASCVD: No  The 10-year ASCVD risk score (Arnett DK, et al., 2019) is: 22.6%   Values used to calculate the score:     Age: 66 years     Sex: Male     Is Non-Hispanic African American: No     Diabetic: No     Tobacco smoker: No     Systolic Blood Pressure: 765 mmHg     Is BP treated: Yes     HDL Cholesterol: 47.8 mg/dL     Total Cholesterol: 200 mg/dL       07/21/2021    8:07 AM 02/20/2021    9:23 AM 02/20/2021    9:20 AM  Depression screen PHQ 2/9  Decreased Interest 2 0 0  Down, Depressed, Hopeless 1 0 0  PHQ - 2 Score 3 0 0  Altered sleeping 2    Tired, decreased energy 3    Change in appetite 3    Feeling bad or failure about yourself  3    Trouble concentrating 0    Moving slowly or fidgety/restless 0    Suicidal thoughts 1    PHQ-9 Score 15        Social History   Tobacco Use  Smoking Status Never  Smokeless Tobacco Never  Tobacco Comments   NEVER USED TOBACCO   BP Readings from Last 3 Encounters:  12/22/21 133/81  09/22/21 (!) 143/79  09/18/21 132/81   Pulse Readings from Last 3 Encounters:  12/22/21 65  09/22/21 83  09/18/21 62   Wt Readings from Last 3 Encounters:  12/22/21 256 lb (116.1 kg)  09/22/21 261 lb (118.4 kg)  09/18/21 259 lb (117.5 kg)   BMI Readings from Last 3 Encounters:  12/22/21 34.72 kg/m  09/22/21 35.40 kg/m  09/18/21 35.13 kg/m    Assessment/Interventions: Review of patient past medical history, allergies, medications, health status, including review of consultants reports, laboratory and other test data, was performed as part of comprehensive evaluation and provision of chronic care management services.   SDOH:  (Social Determinants of Health) assessments and interventions performed: Yes  SDOH Interventions    Flowsheet Row Most Recent Value  SDOH Interventions   Financial Strain Interventions Intervention Not Indicated  Transportation Interventions Intervention Not Indicated      SDOH  Screenings   Alcohol Screen: Low Risk  (02/20/2021)   Alcohol Screen    Last Alcohol Screening Score (AUDIT): 0  Depression (PHQ2-9): Medium Risk (07/21/2021)   Depression (PHQ2-9)    PHQ-2 Score: 15  Financial Resource Strain: Low Risk  (01/28/2022)  Overall Financial Resource Strain (CARDIA)    Difficulty of Paying Living Expenses: Not very hard  Food Insecurity: No Food Insecurity (02/20/2021)   Hunger Vital Sign    Worried About Running Out of Food in the Last Year: Never true    Ran Out of Food in the Last Year: Never true  Housing: Low Risk  (02/20/2021)   Housing    Last Housing Risk Score: 0  Physical Activity: Sufficiently Active (02/20/2021)   Exercise Vital Sign    Days of Exercise per Week: 5 days    Minutes of Exercise per Session: 60 min  Social Connections: Socially Isolated (02/20/2021)   Social Connection and Isolation Panel [NHANES]    Frequency of Communication with Friends and Family: Three times a week    Frequency of Social Gatherings with Friends and Family: Three times a week    Attends Religious Services: Never    Active Member of Clubs or Organizations: No    Attends Archivist Meetings: Never    Marital Status: Never married  Stress: No Stress Concern Present (02/20/2021)   March ARB    Feeling of Stress : Not at all  Tobacco Use: Low Risk  (12/22/2021)   Patient History    Smoking Tobacco Use: Never    Smokeless Tobacco Use: Never    Passive Exposure: Not on file  Transportation Needs: No Transportation Needs (01/28/2022)   PRAPARE - Transportation    Lack of Transportation (Medical): No    Lack of Transportation (Non-Medical): No    CCM Care Plan  Allergies  Allergen Reactions   Allopurinol Other (See Comments)    PATIENT PREFERENCE Pt refused due to mom developing steven johnson syndrome.   Penicillins Hives    Has patient had a PCN reaction causing immediate rash,  facial/tongue/throat swelling, SOB or lightheadedness with hypotension: Unknown Has patient had a PCN reaction causing severe rash involving mucus membranes or skin necrosis:Unknown Has patient had a PCN reaction that required hospitalization: No Has patient had a PCN reaction occurring within the last 10 years: No If all of the above answers are "NO", then may proceed with Cephalosporin use.    Sulfamethoxazole Hives   Sulfonamide Derivatives Hives   Dilaudid [Hydromorphone Hcl] Nausea And Vomiting    Medications Reviewed Today     Reviewed by Melton Krebs, RN (Registered Nurse) on 12/22/21 at 1038  Med List Status: <None>   Medication Order Taking? Sig Documenting Provider Last Dose Status Informant  acetaminophen (TYLENOL) 500 MG tablet [id number]  Take 1,000 mg by mouth every 8 (eight) hours as needed for moderate pain or mild pain.  Patient not taking: Reported on 09/22/2021   [provider]  Active Self  albuterol [facility] HFA) 108 (90 Base) MCG/ACT inhaler [id number]  INHALE 2 PUFFS EVERY 4  HOURS AS NEEDED FOR  WHEEZING OR SHORTNESS OF  BREATH  Patient not taking: Reported on 09/22/2021   [name], MD  Active Self  Apple Cider Vinegar 500 MG TABS [id number]  Take by mouth. [provider]  Active   aspirin EC 81 MG tablet [id number]  Take 162 mg by mouth daily. [provider]  Active Self           Med Note ([place], PATTI E   Wed Feb 27, 2020  8:43 AM)    bisacodyl (DULCOLAX) 5 MG EC tablet [id number]  Take 5 mg by mouth at bedtime. [provider]  Active Self  buPROPion (WELLBUTRIN XL) 300 MG 24 hr tablet [id number]  TAKE 1 TABLET BY MOUTH  DAILY [name], MD  Active Self  Cholecalciferol 50 MCG (2000 UT) TABS [id number]  Take 2,000 Units by mouth daily. [provider]  Active Self  clindamycin (CLEOCIN) 300 MG capsule [id number]  Take 600 mg by mouth once.  Patient not taking: Reported on 09/22/2021   [provider]   Active Self           Med Note Nadara Eaton Sep 09, 2021 12:22 PM) Dental appointment only  clonazePAM (KLONOPIN) 1 MG tablet [id number]  Take 1 tablet (1 mg total) by mouth 2 (two) times daily as needed for anxiety. for anxiety [name], MD  Active   desvenlafaxine (PRISTIQ) 50 MG 24 hr tablet [id number]  Take 1 tablet (50 mg total) by mouth daily. [name], MD  Active   diltiazem (CARDIZEM CD) 240 MG 24 hr capsule [id number]  Take 240 mg by mouth daily. [provider]  Active Self  diltiazem 2 % GEL [id number]  Apply 1 application. topically daily as needed (Anal fissuresanal). prn [provider]  Active Self  docusate sodium (COLACE) 100 MG capsule [id number]  Take 100 mg by mouth at bedtime. [provider]  Active Self  Fluticasone-Salmeterol (ADVAIR DISKUS) 250-50 MCG/DOSE AEPB [id number]  Inhale 1 puff into the lungs in the morning and at bedtime.  Patient not taking: Reported on 09/22/2021   Laurin Coder, MD  Active Self  furosemide (LASIX) 20 MG tablet [id number]  TAKE 1 TABLET BY MOUTH  DAILY AS NEEDED  Patient not taking: Reported on 09/22/2021   Volanda Napoleon, MD  Active Self  gabapentin (NEURONTIN) 300 MG capsule [id number]  Take 300 mg by mouth daily. [provider]  Active   GAMMA AMINOBUTYRIC ACID PO [id number]  Take 750 mg by mouth daily. GABA [provider]  Active Self  HYDROcodone-acetaminophen (NORCO) 5-325 MG tablet [id number]  Take 1-2 tablets by mouth every 6 (six) hours as needed for moderate pain. Gary Fleet, PA-C  Active   hydroxypropyl methylcellulose / hypromellose (ISOPTO TEARS / GONIOVISC) 2.5 % ophthalmic solution [id number]  Place 1-2 drops into both eyes 3 (three) times daily as needed for dry eyes ((SCHEDULED EACH MORNING)). [provider]  Active Self  prazosin (MINIPRESS) 2 MG capsule [id number]  TAKE 1 CAPSULE(2 MG) BY MOUTH AT BEDTIME [name], MD  Active   psyllium  (METAMUCIL) 58.6 % packet [id number]  Take 1 packet by mouth at bedtime. [provider]  Active Self  tadalafil (CIALIS) 5 MG tablet [id number]  Take 1 tablet (5 mg total) by mouth daily as needed for erectile dysfunction.  Patient taking differently: Take 5 mg by mouth at bedtime.   [name], MD  Active Self  tamsulosin [facility]) 0.4 MG CAPS capsule [id number]  Take 0.4 mg by mouth daily. [provider]  Active Self  terazosin (HYTRIN) 1 MG capsule [id number]  Take 1 mg by mouth at bedtime. [provider]  Active Self  XARELTO 20 MG TABS tablet [id number]  TAKE ONE-HALF TABLET BY  MOUTH DAILY Ennever, Rudell Cobb, MD  Active   Med List Note Marylynn Pearson 04/19/17 9977): CPAP WITH SLEEP.            Patient Active Problem List   Diagnosis Date Noted   Chondromalacia of knee, left 09/18/2021  Plica of knee, left 57/84/6962   Complex tear of lat mensc, current injury, left knee, init 09/18/2021   Acute meniscal tear, medial, left, initial encounter 09/18/2021   Hyperglycemia 05/30/2019   Primary osteoarthritis of right knee 01/19/2019   CKD (chronic kidney disease), stage III ([place]) 09/13/2017   Renal atrophy, left 09/13/2017   Systemic lupus erythematosus ([place]) 09/13/2017   Dyslipidemia 09/13/2017   Acute meniscal tear, lateral, right, initial encounter 04/25/2017   Osteoarthritis of right knee 04/25/2017   Concussion with loss of consciousness 08/05/2015   Laceration of spleen 08/05/2015   Lumbar stress fracture 08/05/2015   DVT (deep venous thrombosis) ([place]) 10/18/2014   Hx of bacterial pneumonia 03/10/2013   COLONIC POLYPS, HX OF 04/08/2010   NEPHROLITHIASIS, HX OF 04/08/2010   ACOUSTIC NEUROMA 04/07/2010   Hypogonadism male 04/07/2010   Depression with anxiety 04/07/2010   SLEEP APNEA, OBSTRUCTIVE 04/07/2010   Asthma 04/07/2010   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   ERECTILE DYSFUNCTION, 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   PLANTAR FASCIITIS  04/07/2010    Immunization History  Administered Date(s) Administered   Fluad Quad(high Dose 65+) 03/24/2019   Influenza Split 04/02/2011, 03/16/2012   Influenza, High Dose Seasonal PF 03/25/2017, 04/11/2018   Influenza,inj,Quad PF,6+ Mos 03/27/2013, 03/25/2014, 04/22/2015, 04/28/2016   Influenza-Unspecified 04/30/2020, 03/19/2021   PFIZER Comirnaty(Gray Top)Covid-19 Tri-Sucrose Vaccine 11/03/2020   PFIZER(Purple Top)SARS-COV-2 Vaccination 08/19/2019, 09/11/2019, 05/09/2020   Pfizer Covid-19 Vaccine Bivalent Booster 28yr & up 03/31/2021   Pneumococcal Conjugate-13 04/28/2016   Pneumococcal Polysaccharide-23 07/20/2001, 07/20/2005   Tdap 04/02/2011, 08/02/2015   Zoster Recombinat (Shingrix) 08/28/2020, 10/27/2020   Zoster, Live 08/10/2013   Patient reports he was talking to a PA and she told him that melatonin could have made his nightmares worse so he stopped taking it. He thinks it was the melatonin as the nightmares improved when he stopped.  Patient reports nothing has changed with the prazosin. He is still having some trouble with starting the stream to urinate. He thinks he can live with it.  Patient reports with the mirtazapine he gained 14 lbs and he is still working on losing the 14 lbs. He had to stop it because of the weight gain but he felt like a human on it and is bummed he cannot take it. He asked about other options because he doesn't feel like his current medications are working as well as they could.  Patient has to be careful with his diet because of his kidney. He eats fruits, nuts and berries. Patient has been out working with horses every day. He hasn't been able to do as much because of knee surgery in March.   He doesn't drink alcohol at all. He eats salads for lunches to do less protein. He used to eat a lot of protein and he used to have protein shakes as well.  Conditions to be addressed/monitored:  Hypertension, Asthma, Depression, Anxiety, Osteoarthritis,  BPH and DVT, fluid retention, and constipation  Conditions addressed this visit: Hypertension, depression, anxiety  Care Plan : [place] Updates made by PViona Gilmore RWallersince 01/28/2022 12:00 AM     Problem: Problem: Hypertension, Asthma, Depression, Anxiety, Osteoarthritis, BPH and DVT, fluid retention, and constipation      Long-Range Goal: Patient-Specific Goal   Start Date: 10/14/2020  Expected End Date: 10/14/2021  Recent Progress: On track  Priority: High  Note:   Current Barriers:  Unable to independently monitor therapeutic efficacy Unable to achieve control of blood pressure  Unable  to maintain control of anxiety/depression  Pharmacist Clinical Goal(s):  Patient will achieve adherence to monitoring guidelines and medication adherence to achieve therapeutic efficacy achieve control of blood pressure as evidenced by home blood pressure readings  through collaboration with PharmD and provider.   Interventions: 1:1 collaboration with [name], MD regarding development and update of comprehensive plan of care as evidenced by provider attestation and co-signature Inter-disciplinary care team collaboration (see longitudinal plan of care) Comprehensive medication review performed; medication list updated in electronic medical record  Hypertension (BP goal <130/80) -Controlled -Current treatment: Diltiazem 240 mg 1 capsule daily - Appropriate, Query effective, Safe, Accessible -Medications previously tried: none  -Current home readings: 132/84, 154/90 (arm cuff; checks once a week at work) - checking 2-3 times a week -Current dietary habits: staying away from salt (doesn't add to anything and looks at sodium content of everything) and potassium -Current exercise habits: not going to the gym with COVID, active 4-5 hours a day taking -Denies hypotensive/hypertensive symptoms -Educated on Exercise goal of 150 minutes per week; Importance of home blood  pressure monitoring; Proper BP monitoring technique; -Counseled to monitor BP at home weekly, document, and provide log at future appointments -Counseled on diet and exercise extensively Recommended to continue current medication  Asthma/sleep apnea (Goal: control symptoms) -Controlled -Current treatment  Advair 250-50 mcg/dose 1 puff twice daily - Appropriate, Query effective, Safe, Accessible Albuterol 108 mcg/act 1 puff as needed (2 times a week) - Query Appropriate, Effective, Safe, Accessible -Medications previously tried: none  -Exacerbations requiring treatment in last 6 months: none -Patient denies consistent use of maintenance inhaler -Frequency of rescue inhaler use: twice weekly -Counseled on Benefits of consistent maintenance inhaler use Differences between maintenance and rescue inhalers -Recommended to continue current medication Consider switching Advair to Symbicort for PRN and scheduled use.  Depression/anxiety (Goal: minimize symptoms) -Not ideally controlled -Current treatment: Bupropion XL 300 mg 1 tablet daily - in AM - Appropriate, Query effective, Safe, Accessible Clonazepam 1 mg 1 tablet twice daily as needed for anxiety (taking 1/2 tablet before bed) - Appropriate, Query effective, Query Safe, Accessible Pristiq 25 mg 1 tablet daily - in PM - Appropriate, Query effective, Safe, Accessible -Medications previously tried/failed: Lexapro (unknown), Effexor (weight gain), Prozac (felt artificially good/high), mirtazapine (weight gain)  -PHQ9: 0 -GAD7: n/a -Connected with behavioral health [phone]) for mental health support -Educated on Benefits of medication for symptom control Benefits of cognitive-behavioral therapy with or without medication -Counseled on long term risks of taking benzodiazepines and patient reported nightmares have lessened since decreasing dose  Recommended contacting behavioral health as this can likely help with an appropriate  diagnosis.  DVT history (Goal: prevent future blood clots) -Controlled -Current treatment  Xarelto 10 mg 1 tablet daily - Appropriate, Effective, Safe, Accessible Aspirin 81 mg 2 tablets daily - Appropriate, Effective, Safe, Accessible -Medications previously tried: none  -Counseled on monitoring for signs of bleeding such as unexplained and excessive bleeding from a cut or injury, easy or excessive bruising, blood in urine or stools, and nosebleeds without a known cause  BPH (Goal: minimize symptoms) -Controlled -Current treatment  Prazosin 2 mg 1 capsule daily (in PM) - Appropriate, Effective, Safe, Accessible -Medications previously tried: terazosin (couldn't urinate)  -Recommended to continue current medication  Fluid retention (Goal: minimize swelling) -Controlled -Current treatment  Furosemide 20 mg 1 tablet daily or as needed - Appropriate, Effective, Safe, Accessible -Medications previously tried: none  - Patient only takes if right ankle swells with salt in  diet and he does watch his salt intake and uses low sodium salt morton lite  Osteoarthritis (Goal: minimize pain) -Controlled -Current treatment  Tramadol 50 mg 1 tablet as needed - Appropriate, Effective, Safe, Accessible Acetaminophen 500 mg 2 tablets as needed - Appropriate, Effective, Safe, Accessible -Medications previously tried: none  -Recommended to continue current medication  Constipation (Goal: regular bowel movements) -Controlled -Current treatment  Bisacodyl 5 mg 1 tablet at bedtime - Appropriate, Effective, Safe, Accessible Sennakot 1 tablet daily - Appropriate, Effective, Safe, Accessible Metamucil 1 spoonful daily - Appropriate, Effective, Safe, Accessible -Medications previously tried: none  -Recommended to continue current medication  Health Maintenance -Vaccine gaps: Prevnar 20 or Pneumovax 23, second dose of shingrix -Current therapy:  GABBA 750 mg 1 tablet daily in evening   Diltiazem gel  2% once to twice daily Isopto tears 1 drop 1-2 times a day (artificial tears) -Educated on Cost vs benefit of each product must be carefully weighed by individual consumer -Patient is satisfied with current therapy and denies issues -Recommended to continue current medication  Patient Goals/Self-Care Activities Patient will:  - take medications as prescribed check blood pressure twice weekly, document, and provide at future appointments target a minimum of 150 minutes of moderate intensity exercise weekly  Follow Up Plan: The care management team will reach out to the patient again over the next 14 days.         Medication Assistance: None required.  Patient affirms current coverage meets needs.  Compliance/Adherence/Medication fill history: Care Gaps: Hep C screening, Prevnar20 or Pneumovax, COVID booster Last BP - 133/81 on 12/22/2021   Star-Rating Drugs: None  Patient's preferred pharmacy is:  [id number] - [place], [place] - [address] AT [place] [address] [place] [address] Phone: [phone] Fax: [phone]  [facility] ([facility] ) - [place], [place] [address] Phone: [phone] Fax: [phone]   Uses pill box? No - uses a shelf on his kitchen cabinet separated into morning and evening Pt endorses 100% compliance  We discussed: Current pharmacy is preferred with insurance plan and patient is satisfied with pharmacy services Patient decided to: Continue current medication management strategy  Care Plan and Follow Up Patient Decision:  Patient agrees to Care Plan and Follow-up.  Plan: The care management team will reach out to the patient again over the next 14 days.  Jeni Salles, PharmD [facility] Clinical Pharmacist [place] at [place]

## 2022-01-22 ENCOUNTER — Ambulatory Visit (INDEPENDENT_AMBULATORY_CARE_PROVIDER_SITE_OTHER): Payer: Medicare Other | Admitting: Pharmacist

## 2022-01-22 DIAGNOSIS — N138 Other obstructive and reflux uropathy: Secondary | ICD-10-CM

## 2022-01-22 DIAGNOSIS — F418 Other specified anxiety disorders: Secondary | ICD-10-CM

## 2022-01-28 NOTE — Patient Instructions (Signed)
Hi Joseph Tran,  It was great to catch up again! I will let you know what Dr. Sarajane Jews says about trying genesight as soon as I hear back.  Please reach out to me if you have any questions or need anything!  Best, Joseph Tran  Joseph Tran, PharmD, [facility]   Visit Information   Goals Addressed   None    Patient Care Plan: [facility] Pharmacy Care Plan     Problem Identified: Problem: Hypertension, Asthma, Depression, Anxiety, Osteoarthritis, BPH and DVT, fluid retention, and constipation      Long-Range Goal: Patient-Specific Goal   Start Date: 10/14/2020  Expected End Date: 10/14/2021  Recent Progress: On track  Priority: High  Note:   Current Barriers:  Unable to independently monitor therapeutic efficacy Unable to achieve control of blood pressure  Unable to maintain control of anxiety/depression  Pharmacist Clinical Goal(s):  Patient will achieve adherence to monitoring guidelines and medication adherence to achieve therapeutic efficacy achieve control of blood pressure as evidenced by home blood pressure readings  through collaboration with PharmD and provider.   Interventions: 1:1 collaboration with [name], MD regarding development and update of comprehensive plan of care as evidenced by provider attestation and co-signature Inter-disciplinary care team collaboration (see longitudinal plan of care) Comprehensive medication review performed; medication list updated in electronic medical record  Hypertension (BP goal <130/80) -Controlled -Current treatment: Diltiazem 240 mg 1 capsule daily - Appropriate, Query effective, Safe, Accessible -Medications previously tried: none  -Current home readings: 132/84, 154/90 (arm cuff; checks once a week at work) - checking 2-3 times a week -Current dietary habits: staying away from salt (doesn't add to anything and looks at sodium content of everything) and potassium -Current  exercise habits: not going to the gym with COVID, active 4-5 hours a day taking -Denies hypotensive/hypertensive symptoms -Educated on Exercise goal of 150 minutes per week; Importance of home blood pressure monitoring; Proper BP monitoring technique; -Counseled to monitor BP at home weekly, document, and provide log at future appointments -Counseled on diet and exercise extensively Recommended to continue current medication  Asthma/sleep apnea (Goal: control symptoms) -Controlled -Current treatment  Advair 250-50 mcg/dose 1 puff twice daily - Appropriate, Query effective, Safe, Accessible Albuterol 108 mcg/act 1 puff as needed (2 times a week) - Query Appropriate, Effective, Safe, Accessible -Medications previously tried: none  -Exacerbations requiring treatment in last 6 months: none -Patient denies consistent use of maintenance inhaler -Frequency of rescue inhaler use: twice weekly -Counseled on Benefits of consistent maintenance inhaler use Differences between maintenance and rescue inhalers -Recommended to continue current medication Consider switching Advair to Symbicort for PRN and scheduled use.  Depression/anxiety (Goal: minimize symptoms) -Not ideally controlled -Current treatment: Bupropion XL 300 mg 1 tablet daily - in AM - Appropriate, Query effective, Safe, Accessible Clonazepam 1 mg 1 tablet twice daily as needed for anxiety (taking 1/2 tablet before bed) - Appropriate, Query effective, Query Safe, Accessible Pristiq 25 mg 1 tablet daily - in PM - Appropriate, Query effective, Safe, Accessible -Medications previously tried/failed: Lexapro (unknown), Effexor (weight gain), Prozac (felt artificially good/high), mirtazapine (weight gain)  -PHQ9: 0 -GAD7: n/a -Connected with behavioral health [phone]) for mental health support -Educated on Benefits of medication for symptom control Benefits of cognitive-behavioral therapy with or without medication -Counseled on  long term risks of taking benzodiazepines and patient reported nightmares have lessened since decreasing dose  Recommended contacting behavioral health as this can likely help with an appropriate diagnosis.  DVT history (Goal: prevent future blood clots) -Controlled -Current treatment  Xarelto 10 mg 1 tablet daily - Appropriate, Effective, Safe, Accessible Aspirin 81 mg 2 tablets daily - Appropriate, Effective, Safe, Accessible -Medications previously tried: none  -Counseled on monitoring for signs of bleeding such as unexplained and excessive bleeding from a cut or injury, easy or excessive bruising, blood in urine or stools, and nosebleeds without a known cause  BPH (Goal: minimize symptoms) -Controlled -Current treatment  Prazosin 2 mg 1 capsule daily (in PM) - Appropriate, Effective, Safe, Accessible -Medications previously tried: terazosin (couldn't urinate)  -Recommended to continue current medication  Fluid retention (Goal: minimize swelling) -Controlled -Current treatment  Furosemide 20 mg 1 tablet daily or as needed - Appropriate, Effective, Safe, Accessible -Medications previously tried: none  - Patient only takes if right ankle swells with salt in diet and he does watch his salt intake and uses low sodium salt morton lite  Osteoarthritis (Goal: minimize pain) -Controlled -Current treatment  Tramadol 50 mg 1 tablet as needed - Appropriate, Effective, Safe, Accessible Acetaminophen 500 mg 2 tablets as needed - Appropriate, Effective, Safe, Accessible -Medications previously tried: none  -Recommended to continue current medication  Constipation (Goal: regular bowel movements) -Controlled -Current treatment  Bisacodyl 5 mg 1 tablet at bedtime - Appropriate, Effective, Safe, Accessible Sennakot 1 tablet daily - Appropriate, Effective, Safe, Accessible Metamucil 1 spoonful daily - Appropriate, Effective, Safe, Accessible -Medications previously tried: none  -Recommended  to continue current medication  Health Maintenance -Vaccine gaps: Prevnar 20 or Pneumovax 23, second dose of shingrix -Current therapy:  GABBA 750 mg 1 tablet daily in evening   Diltiazem gel 2% once to twice daily Isopto tears 1 drop 1-2 times a day (artificial tears) -Educated on Cost vs benefit of each product must be carefully weighed by individual consumer -Patient is satisfied with current therapy and denies issues -Recommended to continue current medication  Patient Goals/Self-Care Activities Patient will:  - take medications as prescribed check blood pressure twice weekly, document, and provide at future appointments target a minimum of 150 minutes of moderate intensity exercise weekly  Follow Up Plan: The care management team will reach out to the patient again over the next 14 days.         Patient verbalizes understanding of instructions and care plan provided today and agrees to view in [place]. Active MyChart status and patient understanding of how to access instructions and care plan via MyChart confirmed with patient.    The pharmacy team will reach out to the patient again over the next 14 days.   Viona Gilmore, [facility]

## 2022-02-01 DIAGNOSIS — D649 Anemia, unspecified: Secondary | ICD-10-CM | POA: Diagnosis not present

## 2022-02-01 DIAGNOSIS — F418 Other specified anxiety disorders: Secondary | ICD-10-CM | POA: Diagnosis not present

## 2022-02-01 DIAGNOSIS — R809 Proteinuria, unspecified: Secondary | ICD-10-CM | POA: Diagnosis not present

## 2022-02-01 DIAGNOSIS — I10 Essential (primary) hypertension: Secondary | ICD-10-CM | POA: Diagnosis not present

## 2022-02-01 DIAGNOSIS — N401 Enlarged prostate with lower urinary tract symptoms: Secondary | ICD-10-CM

## 2022-02-01 DIAGNOSIS — N138 Other obstructive and reflux uropathy: Secondary | ICD-10-CM | POA: Diagnosis not present

## 2022-02-01 DIAGNOSIS — N183 Chronic kidney disease, stage 3 unspecified: Secondary | ICD-10-CM | POA: Diagnosis not present

## 2022-02-01 DIAGNOSIS — N189 Chronic kidney disease, unspecified: Secondary | ICD-10-CM | POA: Diagnosis not present

## 2022-02-01 DIAGNOSIS — R309 Painful micturition, unspecified: Secondary | ICD-10-CM | POA: Diagnosis not present

## 2022-02-04 DIAGNOSIS — F418 Other specified anxiety disorders: Secondary | ICD-10-CM | POA: Diagnosis not present

## 2022-02-04 DIAGNOSIS — F331 Major depressive disorder, recurrent, moderate: Secondary | ICD-10-CM | POA: Diagnosis not present

## 2022-02-12 ENCOUNTER — Telehealth: Payer: Self-pay | Admitting: Family Medicine

## 2022-02-12 NOTE — Telephone Encounter (Signed)
Tried to call pt no option to leave a message on pt phone, send Dr Sarajane Jews message via [employer]

## 2022-02-12 NOTE — Telephone Encounter (Signed)
I would wait to get another Covid shot until the latest version comes out this fall. At that time he should wait 2 weeks between the Covid shot and the flu shot

## 2022-02-12 NOTE — Telephone Encounter (Signed)
Patient wanted to let you know he had his last covid booster 03/31/21.  He wanted to know if there is another booster vaccine for covid that he needs to get.  He also wanted to know how much time he needs to wait between getting a covid booster and his flu shot.

## 2022-02-26 ENCOUNTER — Ambulatory Visit (INDEPENDENT_AMBULATORY_CARE_PROVIDER_SITE_OTHER): Payer: Medicare Other

## 2022-02-26 VITALS — Ht 72.0 in | Wt 249.0 lb

## 2022-02-26 DIAGNOSIS — Z0000 Encounter for general adult medical examination without abnormal findings: Secondary | ICD-10-CM | POA: Diagnosis not present

## 2022-02-26 NOTE — Patient Instructions (Signed)
[name] , Thank you for taking time to come for your Medicare Wellness Visit. I appreciate your ongoing commitment to your health goals. Please review the following plan we discussed and let me know if I can assist you in the future.   Screening recommendations/referrals: Colonoscopy: Up to date. Repeat 3 yrs Recommended yearly ophthalmology/optometry visit for glaucoma screening and checkup Recommended yearly dental visit for hygiene and checkup  Vaccinations: Influenza vaccine: Done Up to Date Pneumococcal vaccine: Done Up to date Tdap vaccine: Done up to date Shingles vaccine: Done   Covid-19: Done Up to date  Advanced directives: in chart  Conditions/risks identified:   Next appointment: Follow up in one year for your annual wellness visit.    Preventive Care 71 Years and Older, Male  Preventive care refers to lifestyle choices and visits with your health care provider that can promote health and wellness. What does preventive care include? A yearly physical exam. This is also called an annual well check. Dental exams once or twice a year. Routine eye exams. Ask your health care provider how often you should have your eyes checked. Personal lifestyle choices, including: Daily care of your teeth and gums. Regular physical activity. Eating a healthy diet. Avoiding tobacco and drug use. Limiting alcohol use. Practicing safe sex. Taking low doses of aspirin every day. Taking vitamin and mineral supplements as recommended by your health care provider. What happens during an annual well check? The services and screenings done by your health care provider during your annual well check will depend on your age, overall health, lifestyle risk factors, and family history of disease. Counseling  Your health care provider may ask you questions about your: Alcohol use. Tobacco use. Drug use. Emotional well-being. Home and relationship well-being. Sexual activity. Eating  habits. History of falls. Memory and ability to understand (cognition). Work and work Statistician. Screening  You may have the following tests or measurements: Height, weight, and BMI. Blood pressure. Lipid and cholesterol levels. These may be checked every 5 years, or more frequently if you are over 45 years old. Skin check. Lung cancer screening. You may have this screening every year starting at age 71 if you have a 30-pack-year history of smoking and currently smoke or have quit within the past 15 years. Fecal occult blood test (FOBT) of the stool. You may have this test every year starting at age 711. Flexible sigmoidoscopy or colonoscopy. You may have a sigmoidoscopy every 5 years or a colonoscopy every 10 years starting at age 711. Prostate cancer screening. Recommendations will vary depending on your family history and other risks. Hepatitis C blood test. Hepatitis B blood test. Sexually transmitted disease (STD) testing. Diabetes screening. This is done by checking your blood sugar (glucose) after you have not eaten for a while (fasting). You may have this done every 1-3 years. Abdominal aortic aneurysm (AAA) screening. You may need this if you are a current or former smoker. Osteoporosis. You may be screened starting at age 55 if you are at high risk. Talk with your health care provider about your test results, treatment options, and if necessary, the need for more tests. Vaccines  Your health care provider may recommend certain vaccines, such as: Influenza vaccine. This is recommended every year. Tetanus, diphtheria, and acellular pertussis (Tdap, Td) vaccine. You may need a Td booster every 10 years. Zoster vaccine. You may need this after age 29. Pneumococcal 13-valent conjugate (PCV13) vaccine. One dose is recommended after age 15. Pneumococcal polysaccharide (PPSV23)  vaccine. One dose is recommended after age 68. Talk to your health care provider about which screenings and  vaccines you need and how often you need them. This information is not intended to replace advice given to you by your health care provider. Make sure you discuss any questions you have with your health care provider. Document Released: 07/18/2015 Document Revised: 03/10/2016 Document Reviewed: 04/22/2015 Elsevier Interactive Patient Education  2017 [place] Prevention in the Home Falls can cause injuries. They can happen to people of all ages. There are many things you can do to make your home safe and to help prevent falls. What can I do on the outside of my home? Regularly fix the edges of walkways and driveways and fix any cracks. Remove anything that might make you trip as you walk through a door, such as a raised step or threshold. Trim any bushes or trees on the path to your home. Use bright outdoor lighting. Clear any walking paths of anything that might make someone trip, such as rocks or tools. Regularly check to see if handrails are loose or broken. Make sure that both sides of any steps have handrails. Any raised decks and porches should have guardrails on the edges. Have any leaves, snow, or ice cleared regularly. Use sand or salt on walking paths during winter. Clean up any spills in your garage right away. This includes oil or grease spills. What can I do in the bathroom? Use night lights. Install grab bars by the toilet and in the tub and shower. Do not use towel bars as grab bars. Use non-skid mats or decals in the tub or shower. If you need to sit down in the shower, use a plastic, non-slip stool. Keep the floor dry. Clean up any water that spills on the floor as soon as it happens. Remove soap buildup in the tub or shower regularly. Attach bath mats securely with double-sided non-slip rug tape. Do not have throw rugs and other things on the floor that can make you trip. What can I do in the bedroom? Use night lights. Make sure that you have a light by your  bed that is easy to reach. Do not use any sheets or blankets that are too big for your bed. They should not hang down onto the floor. Have a firm chair that has side arms. You can use this for support while you get dressed. Do not have throw rugs and other things on the floor that can make you trip. What can I do in the kitchen? Clean up any spills right away. Avoid walking on wet floors. Keep items that you use a lot in easy-to-reach places. If you need to reach something above you, use a strong step stool that has a grab bar. Keep electrical cords out of the way. Do not use floor polish or wax that makes floors slippery. If you must use wax, use non-skid floor wax. Do not have throw rugs and other things on the floor that can make you trip. What can I do with my stairs? Do not leave any items on the stairs. Make sure that there are handrails on both sides of the stairs and use them. Fix handrails that are broken or loose. Make sure that handrails are as long as the stairways. Check any carpeting to make sure that it is firmly attached to the stairs. Fix any carpet that is loose or worn. Avoid having throw rugs at the top or bottom  of the stairs. If you do have throw rugs, attach them to the floor with carpet tape. Make sure that you have a light switch at the top of the stairs and the bottom of the stairs. If you do not have them, ask someone to add them for you. What else can I do to help prevent falls? Wear shoes that: Do not have high heels. Have rubber bottoms. Are comfortable and fit you well. Are closed at the toe. Do not wear sandals. If you use a stepladder: Make sure that it is fully opened. Do not climb a closed stepladder. Make sure that both sides of the stepladder are locked into place. Ask someone to hold it for you, if possible. Clearly mark and make sure that you can see: Any grab bars or handrails. First and last steps. Where the edge of each step is. Use tools that  help you move around (mobility aids) if they are needed. These include: Canes. Walkers. Scooters. Crutches. Turn on the lights when you go into a dark area. Replace any light bulbs as soon as they burn out. Set up your furniture so you have a clear path. Avoid moving your furniture around. If any of your floors are uneven, fix them. If there are any pets around you, be aware of where they are. Review your medicines with your doctor. Some medicines can make you feel dizzy. This can increase your chance of falling. Ask your doctor what other things that you can do to help prevent falls. This information is not intended to replace advice given to you by your health care provider. Make sure you discuss any questions you have with your health care provider. Document Released: 04/17/2009 Document Revised: 11/27/2015 Document Reviewed: 07/26/2014 Elsevier Interactive Patient Education  2017 [employer].

## 2022-02-26 NOTE — Progress Notes (Signed)
Subjective:   Joseph Tran is a 71 y.o. male who presents for Medicare Annual/Subsequent preventive examination.  Review of Systems    Virtual Visit via Telephone Note  I connected with  JAHLEN Tran on 02/26/22 at 11:00 AM EDT by telephone and verified that I am speaking with the correct person using two identifiers.  Location: Patient: Home Provider: Office Persons participating in the virtual visit: patient/Nurse Health Advisor   I discussed the limitations, risks, security and privacy concerns of performing an evaluation and management service by telephone and the availability of in person appointments. The patient expressed understanding and agreed to proceed.  Interactive audio and video telecommunications were attempted between this nurse and patient, however failed, due to patient having technical difficulties OR patient did not have access to video capability.  We continued and completed visit with audio only.  Some vital signs may be absent or patient reported.   Joseph Peaches, LPN  Cardiac Risk Factors include: advanced age (>26mn, >>8women);obesity (BMI >30kg/m2);Other (see comment), Risk factor comments: DVT     Objective:    Today's Vitals   02/26/22 1106 02/26/22 1107  Weight: 249 lb (112.9 kg)   Height: 6' (1.829 m)   PainSc:  0-No pain   Body mass index is 33.77 kg/m.     02/26/2022   11:39 AM 12/22/2021   10:36 AM 09/22/2021   10:22 AM 09/11/2021   10:29 AM 04/16/2021    2:47 PM 02/20/2021    9:22 AM 12/09/2020    9:41 AM  Advanced Directives  Does Patient Have a Medical Advance Directive? Yes Yes Yes No Yes Yes Yes  Type of AParamedicof AVolcano Golf CourseLiving will Living will;Healthcare Power of [place]Living will  [place]Living will H[place]fordLiving will HPine BluffsLiving will  Does patient want to make changes to medical advance  directive? No - Patient declined No - Patient declined     No - Patient declined  Copy of [place] Chart? Yes - validated most recent copy scanned in chart (See row information)  No - copy requested  No - copy requested Yes - validated most recent copy scanned in chart (See row information) No - copy requested  Would patient like information on creating a medical advance directive?   No - Patient declined No - Patient declined       Current Medications (verified) Outpatient Encounter Medications as of 02/26/2022  Medication Sig   clindamycin (CLEOCIN) 300 MG capsule Take 600 mg by mouth once.   Fluticasone-Salmeterol (ADVAIR DISKUS) 250-50 MCG/DOSE AEPB Inhale 1 puff into the lungs in the morning and at bedtime.   furosemide (LASIX) 20 MG tablet TAKE 1 TABLET BY MOUTH  DAILY AS NEEDED   tamsulosin (FLOMAX) 0.4 MG CAPS capsule Take 0.4 mg by mouth daily.   acetaminophen (TYLENOL) 500 MG tablet Take 1,000 mg by mouth every 8 (eight) hours as needed for moderate pain or mild pain. (Patient not taking: Reported on 09/22/2021)   albuterol (PROAIR HFA) 108 (90 Base) MCG/ACT inhaler INHALE 2 PUFFS EVERY 4  HOURS AS NEEDED FOR  WHEEZING OR SHORTNESS OF  BREATH (Patient not taking: Reported on 09/22/2021)   Apple Cider Vinegar 500 MG TABS Take 600 mg by mouth daily.   aspirin EC 81 MG tablet Take 162 mg by mouth daily.   b complex vitamins capsule Take 1 capsule by mouth daily.  bisacodyl (DULCOLAX) 5 MG EC tablet Take 5 mg by mouth at bedtime.   buPROPion (WELLBUTRIN XL) 300 MG 24 hr tablet TAKE 1 TABLET BY MOUTH  DAILY   Cholecalciferol 50 MCG (2000 UT) TABS Take 2,000 Units by mouth daily.   clonazePAM (KLONOPIN) 1 MG tablet Take 1 tablet (1 mg total) by mouth 2 (two) times daily as needed for anxiety. for anxiety   desvenlafaxine (PRISTIQ) 50 MG 24 hr tablet Take 1 tablet (50 mg total) by mouth daily.   diltiazem (CARDIZEM CD) 240 MG 24 hr capsule Take 240 mg by mouth daily.    diltiazem 2 % GEL Apply 1 application. topically daily as needed (Anal fissuresanal). prn   docusate sodium (COLACE) 100 MG capsule Take 100 mg by mouth at bedtime.   gabapentin (NEURONTIN) 300 MG capsule Take 300 mg by mouth daily.   GAMMA AMINOBUTYRIC ACID PO Take 750 mg by mouth daily. GABA   HYDROcodone-acetaminophen (NORCO) 5-325 MG tablet Take 1-2 tablets by mouth every 6 (six) hours as needed for moderate pain. (Patient not taking: Reported on 02/26/2022)   hydroxypropyl methylcellulose / hypromellose (ISOPTO TEARS / GONIOVISC) 2.5 % ophthalmic solution Place 1-2 drops into both eyes 3 (three) times daily as needed for dry eyes ((SCHEDULED EACH MORNING)).   prazosin (MINIPRESS) 2 MG capsule TAKE 1 CAPSULE(2 MG) BY MOUTH AT BEDTIME   psyllium (METAMUCIL) 58.6 % packet Take 1 packet by mouth at bedtime.   tadalafil (CIALIS) 5 MG tablet Take 1 tablet (5 mg total) by mouth daily as needed for erectile dysfunction. (Patient not taking: Reported on 02/26/2022)   XARELTO 20 MG TABS tablet TAKE ONE-HALF TABLET BY  MOUTH DAILY   No facility-administered encounter medications on file as of 02/26/2022.    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  right kidny is at 50%   Clotting disorder ([place])    Hx DVT - Xarelto   Colon polyps    Complication of anesthesia    Post op nausea/vomiting   COPD (chronic obstructive pulmonary disease) (HCC)    Depression    severe.  dx 1985   DVT (deep venous thrombosis) ([place])    sees Dr. [name]    History of kidney stones    has  kidney stone now.     Hypertension    Hypogonadism male    sees Dr. Baruch Gouty at [facility]   Neuropathy of both feet    OSA (obstructive sleep apnea)    Plantar fasciitis    rt foot   Pneumonia    PONV (postoperative nausea and vomiting)    Renal atrophy, left    se[facility]. Baruch [facility]y at [facility]   Sleep apnea    tested 2010  - wears c-pap   Past Surgical History:  Procedure Laterality Date   CHEST WALL TUMOR EXCISION  2007   COLONOSCOPY  04/07/2020   per Dr. Hilarie Fredrickson, adenomatous polyps, repeat in 3 yrs    kidney caluculs  2004-05  KNEE ARTHROSCOPY  09-10-11   right knee, per Dr. Dorna Leitz    KNEE ARTHROSCOPY Right 04/25/2017   Procedure: RIGHT KNEE ARTHROSCOPY, PARTIAL LATERAL MENISECTOMY, CHONDROPLASTY MEDIAL AND LATERAL PATELLAOFEMORAL;  Surgeon: Dorna Leitz, MD;  Location: [facility];  Service: Orthopedics;  Laterality: Right;   KNEE ARTHROSCOPY Left 09/18/2021   Procedure: ARTHROSCOPY KNEE;  Surgeon: Dorna Leitz, MD;  Location: [facility];  Service: Orthopedics;  Laterality: Left;   KNEE ARTHROSCOPY WITH MEDIAL MENISECTOMY Left 09/18/2021   Procedure: KNEE ARTHROSCOPY WITH MEDIAL MENISECTOMY;  Surgeon: Dorna Leitz, MD;  Location: [facility];  Service: Orthopedics;  Laterality: Left;   madiscus cartilage  2009   MOHS SURGERY     right knee arthroscopy  2008   TONSILLECTOMY     TOTAL KNEE ARTHROPLASTY Right 01/19/2019   Procedure: RIGHT TOTAL KNEE ARTHROPLASTY;  Surgeon: Dorna Leitz, MD;  Location: [facility];  Service: Orthopedics;  Laterality: Right;   UPPER GASTROINTESTINAL ENDOSCOPY     Family History  Problem Relation Age of Onset   Heart disease Other        parents   Hyperlipidemia Other    Stroke Other        grandparents   Sudden death Other        uncle less than 23 yrs old   Heart disease Father    Throat cancer Maternal Grandmother    Liver cancer Paternal Grandmother    Colon cancer Neg Hx    Esophageal cancer Neg Hx    Rectal cancer Neg Hx     Stomach cancer Neg Hx    Social History   Socioeconomic History   Marital status: Single    Spouse name: Not on file   Number of children: 0   Years of education: Not on file   Highest education level: Not on file  Occupational History   Occupation: retired  Tobacco Use   Smoking status: Never   Smokeless tobacco: Never   Tobacco comments:    NEVER USED TOBACCO  Vaping Use   Vaping Use: Never used  Substance and Sexual Activity   Alcohol use: Never   Drug use: Never   Sexual activity: Not Currently  Other Topics Concern   Not on file  Social History Narrative   Not on file   Social Determinants of Health   Financial Resource Strain: Low Risk  (02/26/2022)   Overall Financial Resource Strain (CARDIA)    Difficulty of Paying Living Expenses: Not hard at all  Food Insecurity: No Food Insecurity (02/26/2022)   Hunger Vital Sign    Worried About Running Out of Food in the Last Year: Never true    Ran Out of Food in the Last Year: Never true  Transportation Needs: No Transportation Needs (02/26/2022)   PRAPARE - Hydrologist (Medical): No    Lack of Transportation (Non-Medical): No  Physical Activity: Inactive (02/26/2022)   Exercise Vital Sign    Days of Exercise per Week: 0 days    Minutes of Exercise per Session: 0 min  Stress: Stress Concern Present (02/26/2022)   [place]    Feeling of Stress : To some extent  Social Connections: Socially Isolated (02/26/2022)   Social Connection and Isolation Panel [NHANES]    Frequency of Communication with Friends and Family: More than three times a week    Frequency of Social Gatherings with Friends and Family: More than three times a week  Attends Religious Services: Never    Active Member of Clubs or Organizations: No    Attends Music therapist: Never    Marital Status: Never married    Tobacco Counseling Counseling  given: Not Answered Tobacco comments: NEVER USED TOBACCO   Clinical Intake:  Pre-visit preparation completed: No  Pain : No/denies pain Pain Score: 0-No pain Pain Relieving Factors: OTC Meds  Pain Relieving Factors: OTC Meds  BMI - recorded: 35.39 Nutritional Status: BMI > 30  Obese Nutritional Risks: None Diabetes: No  How often do you need to have someone help you when you read instructions, pamphlets, or other written materials from your doctor or pharmacy?: 1 - Never  Diabetic?  No  Interpreter Needed?: No  Information entered by :: [name] LPN   Activities of Daily Living    02/26/2022   11:35 AM 09/11/2021   10:32 AM  In your present state of health, do you have any difficulty performing the following activities:  Hearing? 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 0   Difficulty concentrating or making decisions? 1   Comment Followed by PCP   Walking or climbing stairs? 1   Comment Knee Pain. Followed by Orthopedic   Dressing or bathing? 0   Doing errands, shopping? 0 0  Preparing Food and eating ? N   Using the Toilet? N   In the past six months, have you accidently leaked urine? N   Do you have problems with loss of bowel control? N   Managing your Medications? N   Managing your Finances? N   Housekeeping or managing your Housekeeping? N     Patient Care Team: [name], MD as PCP - General Marin Olp, Rudell Cobb, MD as Consulting Physician (Oncology) Viona Gilmore, [facility] as Pharmacist (Pharmacist)  Indicate any recent Medical Services you may have received from other than [facility] providers in the past year (date may be approximate).     Assessment:   This is a routine wellness examination for Joseph Tran.  Hearing/Vision screen Hearing Screening - Comments:: Dx Deaf in left ear. Vision Screening - Comments:: Wears glasses. Followed by Dr Daughtry  Dietary issues and exercise activities discussed: Exercise limited  by: orthopedic condition(s)   Goals Addressed               This Visit's Progress     No current goals (pt-stated)         Depression Screen    02/26/2022   11:22 AM 07/21/2021    8:07 AM 02/20/2021    9:23 AM 02/20/2021    9:20 AM 06/26/2020    9:14 AM 06/04/2020    8:32 AM  PHQ 2/9 Scores  PHQ - 2 Score 2 3 0 0 0 2  PHQ- 9 Score '9 15    6    '$ Fall Risk    02/26/2022   11:39 AM 07/21/2021    8:07 AM 02/20/2021    9:22 AM 06/26/2020    9:14 AM 06/04/2020    8:32 AM  Fall Risk   Falls in the past year? 0 0 0 0 1  Number falls in past yr: 0 0 0  0  Injury with Fall? 0 0 0  0  Comment     tripped over a hose at home  Risk for fall due to : No Fall Risks No Fall Risks No Fall Risks    Follow up  Falls evaluation completed      FALL RISK PREVENTION PERTAINING TO THE HOME:  Any stairs in or around the home? No  If so, are there any without handrails? No  Home free of loose throw rugs in walkways, pet beds, electrical cords, etc? Yes  Adequate lighting in your home to reduce risk of falls? Yes   ASSISTIVE DEVICES UTILIZED TO PREVENT FALLS:  Life alert? No  Use of a cane, walker or w/c? No  Grab bars in the bathroom? No  Shower chair or bench in shower? No  Elevated toilet seat or a handicapped toilet? Yes   TIMED UP AND GO:  Was the test performed? No .Audio Visit    Cognitive Function:        02/26/2022   11:40 AM  6CIT Screen  What Year? 0 points  What month? 0 points  What time? 0 points  Count back from 20 0 points  Months in reverse 0 points  Repeat phrase 0 points  Total Score 0 points    Immunizations Immunization History  Administered Date(s) Administered   Fluad Quad(high Dose 65+) 03/24/2019   Influenza Split 04/02/2011, 03/16/2012   Influenza, High Dose Seasonal PF 03/25/2017, 04/11/2018   Influenza,inj,Quad PF,6+ Mos 03/27/2013, 03/25/2014, 04/22/2015, 04/28/2016   Influenza-Unspecified 04/30/2020, 03/19/2021   PFIZER Comirnaty(Gray  Top)Covid-19 Tri-Sucrose Vaccine 11/03/2020   PFIZER(Purple Top)SARS-COV-2 Vaccination 08/19/2019, 09/11/2019, 05/09/2020   Pfizer Covid-19 Vaccine Bivalent Booster 13yr & up 03/31/2021   Pneumococcal Conjugate-13 04/28/2016   Pneumococcal Polysaccharide-23 07/20/2001, 07/20/2005   Tdap 04/02/2011, 08/02/2015   Zoster Recombinat (Shingrix) 08/28/2020, 10/27/2020   Zoster, Live 08/10/2013    TDAP status: Up to date  Flu Vaccine status: Up to date  Pneumococcal vaccine status: Up to date  Covid-19 vaccine status: Completed vaccines  Qualifies for Shingles Vaccine? Yes   Zostavax completed Yes   Shingrix Completed?: Yes  Screening Tests Health Maintenance  Topic Date Due   Hepatitis C Screening  Never done   INFLUENZA VACCINE  02/02/2022   COVID-19 Vaccine (6 - Pfizer series) 03/14/2022 (Originally 07/31/2021)   Pneumonia Vaccine 71 Years old (3 - PPSV23 or PCV20) 08/05/2023 (Originally 04/28/2017)   COLONOSCOPY (Pts 45-445yrInsurance coverage will need to be confirmed)  04/08/2023   TETANUS/TDAP  08/01/2025   Zoster Vaccines- Shingrix  Completed   HPV VACCINES  Aged Out    Health Maintenance  Health Maintenance Due  Topic Date Due   Hepatitis C Screening  Never done   INFLUENZA VACCINE  02/02/2022    Colorectal cancer screening: Type of screening: Colonoscopy. Completed 04/07/20. Repeat every 3 years  Lung Cancer Screening: (Low Dose CT Chest recommended if Age 71-80ears, 30 pack-year currently smoking OR have quit w/in 15years.) does not qualify.     Additional Screening:  Hepatitis C Screening: does qualify; Completed patient deferred  Vision Screening: Recommended annual ophthalmology exams for early detection of glaucoma and other disorders of the eye. Is the patient up to date with their annual eye exam?  Yes  Who is the provider or what is the name of the office in which the patient attends annual eye exams? [facility] If pt is not established with a  provider, would they like to be referred to a provider to establish care? No .   Dental Screening: Recommended annual dental exams for proper oral hygiene  Community Resource Referral / Chronic Care Management:  CRR required this visit?  No   CCM required this visit?  No  Plan:     I have personally reviewed and noted the following in the patient's chart:   Medical and social history Use of alcohol, tobacco or illicit drugs  Current medications and supplements including opioid prescriptions. Patient is not currently taking opioid prescriptions. Functional ability and status Nutritional status Physical activity Advanced directives List of other physicians Hospitalizations, surgeries, and ER visits in previous 12 months Vitals Screenings to include cognitive, depression, and falls Referrals and appointments  In addition, I have reviewed and discussed with patient certain preventive protocols, quality metrics, and best practice recommendations. A written personalized care plan for preventive services as well as general preventive health recommendations were provided to patient.     Joseph Peaches, LPN   [id number]   Nurse Notes: Patient due labs Hep-C Screening also patient request f/u with concerns and questions about [employer].  Patients  PHQ9 was abnormal but declined counseling referral.Patient states has no current plan of harming himself.

## 2022-03-04 DIAGNOSIS — M25561 Pain in right knee: Secondary | ICD-10-CM | POA: Diagnosis not present

## 2022-03-04 DIAGNOSIS — M1712 Unilateral primary osteoarthritis, left knee: Secondary | ICD-10-CM | POA: Diagnosis not present

## 2022-03-05 ENCOUNTER — Ambulatory Visit (INDEPENDENT_AMBULATORY_CARE_PROVIDER_SITE_OTHER): Payer: Medicare Other

## 2022-03-05 ENCOUNTER — Telehealth: Payer: Self-pay | Admitting: Family Medicine

## 2022-03-05 DIAGNOSIS — E538 Deficiency of other specified B group vitamins: Secondary | ICD-10-CM | POA: Diagnosis not present

## 2022-03-05 DIAGNOSIS — Z23 Encounter for immunization: Secondary | ICD-10-CM | POA: Diagnosis not present

## 2022-03-05 MED ORDER — CYANOCOBALAMIN 1000 MCG/ML IJ SOLN
1000.0000 ug | Freq: Once | INTRAMUSCULAR | Status: AC
  Administered 2022-03-05: 1000 ug via INTRAMUSCULAR

## 2022-03-05 NOTE — Progress Notes (Signed)
Per orders of [employer], injection of VITB12  given by [employer]. Patient tolerated injection well.

## 2022-03-05 NOTE — Telephone Encounter (Signed)
Last refill for Tamsulosin 0.'4mg'$  sent to pharmacy by historical provider. Pharmacy updated.

## 2022-03-05 NOTE — Telephone Encounter (Signed)
Pt is calling and he stop taking prazosin (MINIPRESS) 2 MG capsule due to having trouble urinating the issue has resolve when he went back on tamsulosin (FLOMAX) 0.4 MG CAPS capsule sent to  [facility]) - [place], [place] Phone:  [phone]  Fax:  [phone]

## 2022-03-10 ENCOUNTER — Other Ambulatory Visit: Payer: Self-pay | Admitting: Family Medicine

## 2022-03-10 NOTE — Telephone Encounter (Signed)
Refill for #90 with 3 rf of Tamsulosin

## 2022-03-11 ENCOUNTER — Other Ambulatory Visit: Payer: Self-pay

## 2022-03-11 MED ORDER — TAMSULOSIN HCL 0.4 MG PO CAPS: 0.4000 mg | ORAL_CAPSULE | Freq: Every day | ORAL | 3 refills | Status: DC

## 2022-03-11 NOTE — Telephone Encounter (Signed)
Refill sent to for Tamsulosin 0.4 mg sent to [facility]

## 2022-03-24 ENCOUNTER — Other Ambulatory Visit: Payer: Self-pay

## 2022-03-24 ENCOUNTER — Inpatient Hospital Stay: Payer: Medicare Other | Attending: Hematology & Oncology

## 2022-03-24 ENCOUNTER — Encounter: Payer: Self-pay | Admitting: Hematology & Oncology

## 2022-03-24 ENCOUNTER — Inpatient Hospital Stay (HOSPITAL_BASED_OUTPATIENT_CLINIC_OR_DEPARTMENT_OTHER): Payer: Medicare Other | Admitting: Hematology & Oncology

## 2022-03-24 VITALS — BP 118/70 | HR 69 | Temp 98.3°F | Resp 18 | Ht 72.0 in | Wt 257.0 lb

## 2022-03-24 DIAGNOSIS — E538 Deficiency of other specified B group vitamins: Secondary | ICD-10-CM | POA: Diagnosis not present

## 2022-03-24 DIAGNOSIS — I82891 Chronic embolism and thrombosis of other specified veins: Secondary | ICD-10-CM | POA: Insufficient documentation

## 2022-03-24 DIAGNOSIS — Z7982 Long term (current) use of aspirin: Secondary | ICD-10-CM | POA: Insufficient documentation

## 2022-03-24 DIAGNOSIS — N289 Disorder of kidney and ureter, unspecified: Secondary | ICD-10-CM | POA: Insufficient documentation

## 2022-03-24 DIAGNOSIS — E0844 Diabetes mellitus due to underlying condition with diabetic amyotrophy: Secondary | ICD-10-CM

## 2022-03-24 DIAGNOSIS — I82511 Chronic embolism and thrombosis of right femoral vein: Secondary | ICD-10-CM | POA: Diagnosis not present

## 2022-03-24 DIAGNOSIS — I82401 Acute embolism and thrombosis of unspecified deep veins of right lower extremity: Secondary | ICD-10-CM | POA: Diagnosis not present

## 2022-03-24 DIAGNOSIS — Z79899 Other long term (current) drug therapy: Secondary | ICD-10-CM | POA: Insufficient documentation

## 2022-03-24 DIAGNOSIS — Z7901 Long term (current) use of anticoagulants: Secondary | ICD-10-CM | POA: Insufficient documentation

## 2022-03-24 DIAGNOSIS — I82531 Chronic embolism and thrombosis of right popliteal vein: Secondary | ICD-10-CM | POA: Diagnosis not present

## 2022-03-24 DIAGNOSIS — R7303 Prediabetes: Secondary | ICD-10-CM

## 2022-03-24 DIAGNOSIS — D510 Vitamin B12 deficiency anemia due to intrinsic factor deficiency: Secondary | ICD-10-CM

## 2022-03-24 LAB — CMP (CANCER CENTER ONLY)
ALT: 14 U/L (ref 0–44)
AST: 15 U/L (ref 15–41)
Albumin: 4.2 g/dL (ref 3.5–5.0)
Alkaline Phosphatase: 80 U/L (ref 38–126)
Anion gap: 6 (ref 5–15)
BUN: 20 mg/dL (ref 8–23)
CO2: 27 mmol/L (ref 22–32)
Calcium: 9.6 mg/dL (ref 8.9–10.3)
Chloride: 108 mmol/L (ref 98–111)
Creatinine: 2.01 mg/dL — ABNORMAL HIGH (ref 0.61–1.24)
GFR, Estimated: 35 mL/min — ABNORMAL LOW (ref >60)
Glucose, Bld: 92 mg/dL (ref 70–99)
Potassium: 4.4 mmol/L (ref 3.5–5.1)
Sodium: 141 mmol/L (ref 135–145)
Total Bilirubin: 0.6 mg/dL (ref 0.3–1.2)
Total Protein: 6.8 g/dL (ref 6.5–8.1)

## 2022-03-24 LAB — CBC WITH DIFFERENTIAL (CANCER CENTER ONLY)
Abs Immature Granulocytes: 0.04 10*3/uL (ref 0.00–0.07)
Basophils Absolute: 0.1 10*3/uL (ref 0.0–0.1)
Basophils Relative: 2 %
Eosinophils Absolute: 0.4 10*3/uL (ref 0.0–0.5)
Eosinophils Relative: 7 %
HCT: 43.5 % (ref 39.0–52.0)
Hemoglobin: 14.5 g/dL (ref 13.0–17.0)
Immature Granulocytes: 1 %
Lymphocytes Relative: 21 %
Lymphs Abs: 1.2 10*3/uL (ref 0.7–4.0)
MCH: 31.4 pg (ref 26.0–34.0)
MCHC: 33.3 g/dL (ref 30.0–36.0)
MCV: 94.2 fL (ref 80.0–100.0)
Monocytes Absolute: 0.4 10*3/uL (ref 0.1–1.0)
Monocytes Relative: 7 %
Neutro Abs: 3.5 10*3/uL (ref 1.7–7.7)
Neutrophils Relative %: 62 %
Platelet Count: 166 10*3/uL (ref 150–400)
RBC: 4.62 MIL/uL (ref 4.22–5.81)
RDW: 12.8 % (ref 11.5–15.5)
WBC Count: 5.7 10*3/uL (ref 4.0–10.5)
nRBC: 0 % (ref 0.0–0.2)

## 2022-03-24 LAB — VITAMIN B12: Vitamin B-12: 561 pg/mL (ref 180–914)

## 2022-03-24 NOTE — Progress Notes (Signed)
Hematology and Oncology Follow Up Visit  Joseph Tran [id number] July 21, 1950 71 y.o. 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  Unfortunately, he is having problems with his left knee.  It sounds like he is going to need surgery for this.  He had a chest surgery back in March.  He injured the knee after his surgery.  He says that the x-rays show that this is "bone-on-bone".  I think he sees the Orthopedist in October.  Otherwise, he is doing well with the Xarelto.  He has had no problems with taking his Xarelto.  His renal function is slowly declining.  We will have to be cautious with Xarelto given his renal function.  Right now, the GFR is 37cc/mL.  He has had no bleeding.  He has had no cough or chest wall pain.  He has had little swelling in the legs.  He does take some Lasix for this.  He does have B12 deficiency.  He does take vitamin B-12 intramuscularly.  His last vitamin B12 level back in March was 478.  Currently, I would say his performance status is probably ECOG 1.   Medications:  Allergies as of 03/24/2022       Reactions   Allopurinol Other (See Comments)   PATIENT PREFERENCE Pt refused due to mom developing steven johnson syndrome.   Penicillins Hives   Has patient had a PCN reaction causing immediate rash, facial/tongue/throat swelling, SOB or lightheadedness with hypotension: Unknown Has patient had a PCN reaction causing severe rash involving mucus membranes or skin necrosis:Unknown Has patient had a PCN reaction that required hospitalization: No Has patient had a PCN reaction occurring within the last 10 years: No If all of the above answers are "NO", then may proceed with Cephalosporin  use.   Sulfamethoxazole Hives   Sulfonamide Derivatives Hives   Dilaudid [hydromorphone Hcl] Nausea And Vomiting        Medication List        Accurate as of March 24, 2022 10:47 AM. If you have any questions, ask your nurse or doctor.          STOP taking these medications    GAMMA AMINOBUTYRIC ACID PO Stopped by: Volanda Napoleon, MD   prazosin 2 MG capsule Commonly known as: MINIPRESS Stopped by: Volanda Napoleon, MD       TAKE these medications    acetaminophen 500 MG tablet Commonly known as: TYLENOL Take 1,000 mg by mouth every 8 (eight) hours as needed for moderate pain or mild pain.   albuterol 108 (90 Base) MCG/ACT inhaler Commonly known as: ProAir HFA INHALE 2 PUFFS EVERY 4  HOURS AS NEEDED FOR  WHEEZING OR SHORTNESS OF  BREATH   Apple Cider Vinegar 500 MG Tabs Take 600 mg by mouth daily.   aspirin EC 81 MG tablet Take 162 mg by mouth daily.   b complex vitamins capsule Take 1 capsule by mouth daily.   bisacodyl 5 MG EC tablet Commonly known as: DULCOLAX Take 5 mg by mouth at bedtime.   buPROPion 300 MG 24 hr tablet Commonly known as: WELLBUTRIN XL TAKE 1 TABLET BY MOUTH  DAILY   Cholecalciferol 50 MCG (  2000 UT) Tabs Take 2,000 Units by mouth daily.   clindamycin 300 MG capsule Commonly known as: CLEOCIN Take 600 mg by mouth once.   clonazePAM 1 MG tablet Commonly known as: KLONOPIN Take 1 tablet (1 mg total) by mouth 2 (two) times daily as needed for anxiety. for anxiety   desvenlafaxine 50 MG 24 hr tablet Commonly known as: Pristiq Take 1 tablet (50 mg total) by mouth daily.   diltiazem 2 % Gel Apply 1 application. topically daily as needed (Anal fissuresanal). prn   diltiazem 240 MG 24 hr capsule Commonly known as: CARDIZEM CD Take 240 mg by mouth daily.   docusate sodium 100 MG capsule Commonly known as: COLACE Take 100 mg by mouth at bedtime.   Fluticasone-Salmeterol 250-50 MCG/DOSE Aepb Commonly known as: Advair  Diskus Inhale 1 puff into the lungs in the morning and at bedtime.   furosemide 20 MG tablet Commonly known as: LASIX TAKE 1 TABLET BY MOUTH  DAILY AS NEEDED   gabapentin 300 MG capsule Commonly known as: NEURONTIN Take 300 mg by mouth daily.   HYDROcodone-acetaminophen 5-325 MG tablet Commonly known as: Norco Take 1-2 tablets by mouth every 6 (six) hours as needed for moderate pain.   hydroxypropyl methylcellulose / hypromellose 2.5 % ophthalmic solution Commonly known as: ISOPTO TEARS / GONIOVISC Place 1-2 drops into both eyes 3 (three) times daily as needed for dry eyes ((SCHEDULED EACH MORNING)).   psyllium 58.6 % packet Commonly known as: METAMUCIL Take 1 packet by mouth at bedtime.   tadalafil 5 MG tablet Commonly known as: Cialis Take 1 tablet (5 mg total) by mouth daily as needed for erectile dysfunction.   tamsulosin 0.4 MG Caps capsule Commonly known as: FLOMAX Take 1 capsule (0.4 mg total) by mouth daily.   Xarelto 20 MG Tabs tablet Generic drug: rivaroxaban TAKE ONE-HALF TABLET BY  MOUTH DAILY        Allergies:  Allergies  Allergen Reactions   Allopurinol Other (See Comments)    PATIENT PREFERENCE Pt refused due to mom developing steven johnson syndrome.   Penicillins Hives    Has patient had a PCN reaction causing immediate rash, facial/tongue/throat swelling, SOB or lightheadedness with hypotension: Unknown Has patient had a PCN reaction causing severe rash involving mucus membranes or skin necrosis:Unknown Has patient had a PCN reaction that required hospitalization: No Has patient had a PCN reaction occurring within the last 10 years: No If all of the above answers are "NO", then may proceed with Cephalosporin use.    Sulfamethoxazole Hives   Sulfonamide Derivatives Hives   Dilaudid [Hydromorphone Hcl] Nausea And Vomiting    Past Medical History, Surgical history, Social history, and Family History were reviewed and updated.  Review of  Systems: Review of Systems  Constitutional: Negative.   HENT: Negative.    Eyes: Negative.   Respiratory: Negative.    Cardiovascular: Negative.   Gastrointestinal: Negative.   Genitourinary: Negative.   Musculoskeletal: Negative.   Skin: Negative.   Neurological: Negative.   Endo/Heme/Allergies: Negative.   Psychiatric/Behavioral: Negative.       Physical Exam:  height is 6' (1.829 m) and weight is 257 lb (116.6 kg). His oral temperature is 98.3 F (36.8 C). His blood pressure is 118/70 and his pulse is 69. His respiration is 18 and oxygen saturation is 96%.   Wt Readings from Last 3 Encounters:  03/24/22 257 lb (116.6 kg)  02/26/22 249 lb (112.9 kg)  12/22/21 256 lb (116.1 kg)  Physical Exam Vitals reviewed.  HENT:     Head: Normocephalic and atraumatic.  Eyes:     Pupils: Pupils are equal, round, and reactive to light.  Cardiovascular:     Rate and Rhythm: Normal rate and regular rhythm.     Heart sounds: Normal heart sounds.  Pulmonary:     Effort: Pulmonary effort is normal.     Breath sounds: Normal breath sounds.  Abdominal:     General: Bowel sounds are normal.     Palpations: Abdomen is soft.  Musculoskeletal:        General: No tenderness or deformity. Normal range of motion.     Cervical back: Normal range of motion.  Lymphadenopathy:     Cervical: No cervical adenopathy.  Skin:    General: Skin is warm and dry.     Findings: No erythema or rash.  Neurological:     Mental Status: He is alert and oriented to person, place, and time.  Psychiatric:        Behavior: Behavior normal.        Thought Content: Thought content normal.        Judgment: Judgment normal.      Lab Results  Component Value Date   WBC 5.7 03/24/2022   HGB 14.5 03/24/2022   HCT 43.5 03/24/2022   MCV 94.2 03/24/2022   PLT 166 03/24/2022   Lab Results  Component Value Date   FERRITIN 116 03/31/2017   IRON 101 03/31/2017   TIBC 286 03/31/2017   UIBC 185 03/31/2017    IRONPCTSAT 35 03/31/2017   Lab Results  Component Value Date   RBC 4.62 03/24/2022   No results found for: "KPAFRELGTCHN", "LAMBDASER", "KAPLAMBRATIO" No results found for: "IGGSERUM", "IGA", "IGMSERUM" No results found for: "TOTALPROTELP", "ALBUMINELP", "A1GS", "A2GS", "BETS", "BETA2SER", "GAMS", "MSPIKE", "SPEI"   Chemistry      Component Value Date/Time   NA 141 03/24/2022 0958   NA 145 06/29/2017 1452   K 4.4 03/24/2022 0958   K 4.7 06/29/2017 1452   CL 108 03/24/2022 0958   CL 107 06/29/2017 1452   CO2 27 03/24/2022 0958   CO2 25 06/29/2017 1452   BUN 20 03/24/2022 0958   BUN 33 (H) 06/29/2017 1452   CREATININE 2.01 (H) 03/24/2022 0958   CREATININE 1.56 (H) 06/04/2020 0950      Component Value Date/Time   CALCIUM 9.6 03/24/2022 0958   CALCIUM 9.5 06/29/2017 1452   ALKPHOS 80 03/24/2022 0958   ALKPHOS 83 06/29/2017 1452   AST 15 03/24/2022 0958   ALT 14 03/24/2022 0958   ALT 23 06/29/2017 1452   BILITOT 0.6 03/24/2022 0958       Impression and Plan: Mr. 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   He is on Xarelto.  Also on baby aspirin.  I think the 2 together is certainly a good combination for him.  I do not see any problems with him having surgery for his left knee.  We can always adjust his Xarelto.  Again, he will see the Orthopedic Surgeon in October.  I will plan to get him back to see [place] probably in about 4 months.  I would like to try to get him through the [place] season.  Again if he has surgery before the holidays, I will be more than happy to see him back sooner.  Volanda Napoleon, MD 9/20/202310:47 AM

## 2022-03-26 ENCOUNTER — Ambulatory Visit (INDEPENDENT_AMBULATORY_CARE_PROVIDER_SITE_OTHER): Payer: Medicare Other | Admitting: Family Medicine

## 2022-03-26 ENCOUNTER — Encounter: Payer: Self-pay | Admitting: Family Medicine

## 2022-03-26 VITALS — BP 132/80 | HR 71 | Temp 98.0°F | Wt 255.0 lb

## 2022-03-26 DIAGNOSIS — F418 Other specified anxiety disorders: Secondary | ICD-10-CM

## 2022-03-26 MED ORDER — SEMAGLUTIDE (1 MG/DOSE) 4 MG/3ML ~~LOC~~ SOPN: 1.0000 mg | PEN_INJECTOR | SUBCUTANEOUS | 5 refills | Status: DC

## 2022-03-26 MED ORDER — ONDANSETRON HCL 8 MG PO TABS: 8.0000 mg | ORAL_TABLET | Freq: Three times a day (TID) | ORAL | 2 refills | Status: DC | PRN

## 2022-03-26 NOTE — Progress Notes (Signed)
   Subjective:    Patient ID: Joseph Tran, male    DOB: 1951/06/24, 71 y.o.   MRN: [id number]  HPI Here for several issues. First we recently received the results of a GeneSight study we had ordered to determine the best antidepressant medications for him. Of note, the two he is taking (Wellbutrin and Pristiq) are among the best 5 that he could take in terms of maximizing efficacy and minimizing side effects. He says they are working well for him. He also asks for help with losing weight. He gets exercise daily by working with his horses, and he eats a healthy diet consisting mainly of yogurt and vegetables. Yet he cannot lose weight.    Review of Systems  Constitutional: Negative.   Respiratory: Negative.    Cardiovascular: Negative.   Gastrointestinal: Negative.        Objective:   Physical Exam Constitutional:      Appearance: He is obese.  Cardiovascular:     Rate and Rhythm: Normal rate and regular rhythm.     Pulses: Normal pulses.     Heart sounds: Normal heart sounds.  Pulmonary:     Effort: Pulmonary effort is normal.     Breath sounds: Normal breath sounds.  Neurological:     General: No focal deficit present.     Mental Status: He is alert. Mental status is at baseline.  Psychiatric:        Mood and Affect: Mood normal.        Behavior: Behavior normal.        Thought Content: Thought content normal.          Assessment & Plan:  His depression and anxiety are responding well to the combination of Wellbutrin and Pristiq. The GenSight results confirm this is a good regimen for him to be on, so we agreed to stick with it. As for the obesity, he will try Semaglutide 1 mg weekly injections. He will report back in one month. We spent a total of ( 35  ) minutes reviewing records and discussing these issues.   [name], MD

## 2022-04-12 DIAGNOSIS — M1712 Unilateral primary osteoarthritis, left knee: Secondary | ICD-10-CM | POA: Diagnosis not present

## 2022-04-13 ENCOUNTER — Telehealth: Payer: Self-pay | Admitting: Pharmacist

## 2022-04-13 DIAGNOSIS — Z23 Encounter for immunization: Secondary | ICD-10-CM | POA: Diagnosis not present

## 2022-04-13 NOTE — Chronic Care Management (AMB) (Signed)
Chronic Care Management Pharmacy Assistant   Name: Joseph Tran  MRN: [id number] DOB: 1951/03/06  Reason for Encounter: Disease State / Hypertension Assessment Call   Conditions to be addressed/monitored: HTN  Recent office visits:  03/26/2022 Joseph Penna MD - Patient was seen for depression with anxiety and an additional issue. Started Ondansetron 8 mg and Semaglutide '1mg'$   weekly. No follow up noted.   02/26/2022 Joseph Arbour LPN - Medicare annual wellness exam  Recent consult visits:  03/24/2022 Joseph Gauze MD (oncology) - Patient was seen for Acute deep vein thrombosis (DVT) of right lower extremity, unspecified vein and additional concerns. Discontinued Gamma Aminobutyric Acid and Prazosin. Follow up in 16 weeks.   02/01/2022 Joseph Schillings MD (nephrology) - Patient was seen for chronic kidney disease stage 3 and hypertension. Discontinued terazosin. Follow up in 4 months.   Hospital visits:  None  Medications: Outpatient Encounter Medications as of 04/13/2022  Medication Sig Note   acetaminophen (TYLENOL) 500 MG tablet Take 1,000 mg by mouth every 8 (eight) hours as needed for moderate pain or mild pain.    albuterol (PROAIR HFA) 108 (90 Base) MCG/ACT inhaler INHALE 2 PUFFS EVERY 4  HOURS AS NEEDED FOR  WHEEZING OR SHORTNESS OF  BREATH    Apple Cider Vinegar 500 MG TABS Take 600 mg by mouth daily.    aspirin EC 81 MG tablet Take 162 mg by mouth daily.    b complex vitamins capsule Take 1 capsule by mouth daily.    bisacodyl (DULCOLAX) 5 MG EC tablet Take 5 mg by mouth at bedtime.    buPROPion (WELLBUTRIN XL) 300 MG 24 hr tablet TAKE 1 TABLET BY MOUTH  DAILY    Cholecalciferol 50 MCG (2000 UT) TABS Take 2,000 Units by mouth daily.    clindamycin (CLEOCIN) 300 MG capsule Take 600 mg by mouth once. 09/09/2021: Dental appointment only   clonazePAM (KLONOPIN) 1 MG tablet Take 1 tablet (1 mg total) by mouth 2 (two) times daily as needed for anxiety. for anxiety    desvenlafaxine  (PRISTIQ) 50 MG 24 hr tablet Take 1 tablet (50 mg total) by mouth daily.    diltiazem (CARDIZEM CD) 240 MG 24 hr capsule Take 240 mg by mouth daily.    diltiazem 2 % GEL Apply 1 application. topically daily as needed (Anal fissuresanal). prn    docusate sodium (COLACE) 100 MG capsule Take 100 mg by mouth at bedtime.    Fluticasone-Salmeterol (ADVAIR DISKUS) 250-50 MCG/DOSE AEPB Inhale 1 puff into the lungs in the morning and at bedtime.    furosemide (LASIX) 20 MG tablet TAKE 1 TABLET BY MOUTH  DAILY AS NEEDED    gabapentin (NEURONTIN) 300 MG capsule Take 300 mg by mouth daily.    HYDROcodone-acetaminophen (NORCO) 5-325 MG tablet Take 1-2 tablets by mouth every 6 (six) hours as needed for moderate pain.    hydroxypropyl methylcellulose / hypromellose (ISOPTO TEARS / GONIOVISC) 2.5 % ophthalmic solution Place 1-2 drops into both eyes 3 (three) times daily as needed for dry eyes ((SCHEDULED EACH MORNING)).    ondansetron (ZOFRAN) 8 MG tablet Take 1 tablet (8 mg total) by mouth every 8 (eight) hours as needed for nausea or vomiting.    psyllium (METAMUCIL) 58.6 % packet Take 1 packet by mouth at bedtime.    Semaglutide, 1 MG/DOSE, 4 MG/3ML SOPN Inject 1 mg as directed once a week.    tadalafil (CIALIS) 5 MG tablet Take 1 tablet (5 mg total) by  mouth daily as needed for erectile dysfunction.    tamsulosin (FLOMAX) 0.4 MG CAPS capsule Take 1 capsule (0.4 mg total) by mouth daily.    traMADol (ULTRAM) 50 MG tablet Take 50 mg by mouth every 6 (six) hours as needed.    XARELTO 20 MG TABS tablet TAKE ONE-HALF TABLET BY  MOUTH DAILY    No facility-administered encounter medications on file as of 04/13/2022.  Fill History:  BUPROPION HYDROCHLORIDE ER (XL)  300 MG TB24 03/23/2022 90   CLINDAMYCIN '300MG'$  CAPSULES 12/15/2021 4   CLONAZEPAM  1 MG TABS 11/18/2021 90   DESVENLAFAXINE ER  50 MG TB24 03/23/2022 90   DILTIAZEM HYDROCHLORIDE ER  240 MG CP24 04/02/2022 90   WIXELA INHUB  250-50 MCG/ACT AEPB  11/01/2019 90   FUROSEMIDE  20 MG TABS 03/23/2022 90   GABAPENTIN '300MG'$  CAPSULES 03/23/2022 30   HYDROCODONE/ACETAMINOPHEN 5-325 TB 12/07/2021 6   ONDANSETRON '8MG'$  TABLETS 04/07/2022 10   OZEMPIC '1MG'$  PER DOSE (1X'4MG'$  PEN) 04/07/2022 28   TAMSULOSIN HYDROCHLORIDE  0.4 MG CAPS 03/11/2022 90   TRAMADOL '50MG'$  TABLETS 03/04/2022 20   Reviewed chart prior to disease state call. Spoke with patient regarding BP  Recent Office Vitals: BP Readings from Last 3 Encounters:  03/26/22 132/80  03/24/22 118/70  12/22/21 133/81   Pulse Readings from Last 3 Encounters:  03/26/22 71  03/24/22 69  12/22/21 65    Wt Readings from Last 3 Encounters:  03/26/22 255 lb (115.7 kg)  03/24/22 257 lb (116.6 kg)  02/26/22 249 lb (112.9 kg)     Kidney Function Lab Results  Component Value Date/Time   CREATININE 2.01 (H) 03/24/2022 09:58 AM   CREATININE 1.91 (H) 12/22/2021 09:42 AM   CREATININE 1.56 (H) 06/04/2020 09:50 AM   CREATININE 2.1 (H) 06/29/2017 02:52 PM   GFR 39.34 (L) 07/21/2021 08:41 AM   GFRNONAA 35 (L) 03/24/2022 09:58 AM   GFRNONAA 45 (L) 06/04/2020 09:50 AM   GFRAA 52 (L) 06/04/2020 09:50 AM       Latest Ref Rng & Units 03/24/2022    9:58 AM 12/22/2021    9:42 AM 09/22/2021   10:04 AM  BMP  Glucose 70 - 99 mg/dL 92  79  85   BUN 8 - 23 mg/dL '20  28  22   '$ Creatinine 0.61 - 1.24 mg/dL 2.01  1.91  1.76   Sodium 135 - 145 mmol/L 141  143  141   Potassium 3.5 - 5.1 mmol/L 4.4  4.2  4.0   Chloride 98 - 111 mmol/L 108  109  107   CO2 22 - 32 mmol/L '27  27  28   '$ Calcium 8.9 - 10.3 mg/dL 9.6  9.5  9.4     Current antihypertensive regimen:  Diltazem 240 mg daily  How often are you checking your Blood Pressure? 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   Current home BP readings: His readings at [facility] have been between 120/75 and 130/85.  What recent  interventions/DTPs have been made by any provider to improve Blood Pressure control since last CPP Visit: No recent interventions.  Any recent hospitalizations or ED visits since last visit with CPP? No recent hospital visits.   What diet changes have been made to improve Blood Pressure Control?  Patient follows a low sodium and low carbohydrate Breakfast - patient  doesn't eat breakfast Lunch - patient will have a salad without meat Dinner - patient will have kale salad without meat Snack - patient will have a variety of vegetables with a dressing Patient states the only meat he has is if he is running short of time and he will then have a burger or chicken.   What exercise is being done to improve your Blood Pressure Control?  Patient shows horses daily and working at [employer]  Adherence Review: Is the patient currently on ACE/ARB medication? No Does the patient have >5 day gap between last estimated fill dates? No  Care Gaps: AWV - 01/21/2022 message sent to Ramond Craver Last BP - 132/80 on 03/26/2022 Hep C Screen - never done Covid  - overdue Pneumonia vaccine - postponed  Star Rating Drugs: Semaglutide '1mg'$   - last filled 04/07/2022 28 DS at [facility] Pharmacist Assistant [phone]

## 2022-04-14 ENCOUNTER — Telehealth: Payer: Self-pay | Admitting: Pulmonary Disease

## 2022-04-14 NOTE — Telephone Encounter (Signed)
When surgical request is received I will send to doctor to get him cleared for surgery. Nothing further needed

## 2022-04-14 NOTE — Telephone Encounter (Signed)
Patient called to inform the nurse and doctor to be expecting a surgical clearance form from his ortho Dr. Berenice Primas because patient will be having surgery on his knee.  He would like the doctor to respond back as soon as possible so that he will not have any issues with his surgery.  If there are any questions, please call patient at [phone]

## 2022-04-22 ENCOUNTER — Telehealth: Payer: Self-pay | Admitting: Pulmonary Disease

## 2022-04-22 NOTE — Telephone Encounter (Signed)
[facility] is calling because they still have not gotten the surgical clearance.  Would like to get it asap for patient.  They also said they are faxing it over again.  Please call them to let them know when it is ready.  CB# [phone], Fax# [phone]

## 2022-04-22 NOTE — Telephone Encounter (Signed)
Patient called back to schedule appt.  There was nothing available until Nov. 3 or if he could come this afternoon because there were two available appts.  Patient said he could not make those appts. And stated he could not wait until Nov. 3.  He wanted to know if he could possibly be worked in sooner.  He wanted to talk to the nurse to see if the clearance he had last year would be sufficient.  Explained to patient the note from April and patient still would like a call back.  Please advise.  CB# [phone]

## 2022-04-22 NOTE — Telephone Encounter (Signed)
Called and left voicemail for patient that I did call Joseph Tran from [facility] and she is sending me the correct paperwork. And that when he has a moment he needs to cal the office to set up an appointment with Dr Jenetta Downer to get cleared for surgery since he was last seen last year. Nothing further needed

## 2022-04-26 ENCOUNTER — Encounter: Payer: Self-pay | Admitting: Nurse Practitioner

## 2022-04-26 ENCOUNTER — Ambulatory Visit (INDEPENDENT_AMBULATORY_CARE_PROVIDER_SITE_OTHER): Payer: Medicare Other | Admitting: Nurse Practitioner

## 2022-04-26 DIAGNOSIS — Z01811 Encounter for preprocedural respiratory examination: Secondary | ICD-10-CM | POA: Diagnosis not present

## 2022-04-26 DIAGNOSIS — G4733 Obstructive sleep apnea (adult) (pediatric): Secondary | ICD-10-CM | POA: Diagnosis not present

## 2022-04-26 DIAGNOSIS — J4520 Mild intermittent asthma, uncomplicated: Secondary | ICD-10-CM

## 2022-04-26 NOTE — Patient Instructions (Addendum)
Continue Albuterol inhaler 2 puffs every 6 hours as needed for shortness of breath or wheezing. Notify if symptoms persist despite rescue inhaler/neb use. Continue CPAP nightly, minimum of 4-6 hours nightly  Out of bed as soon as possible after surgery. Use incentive spirometer 10 times an hour while awake in the recovery phase.   Good luck with your surgery!!  Follow up in one year with Dr. Ander Slade or sooner if needed

## 2022-04-26 NOTE — Assessment & Plan Note (Signed)
Moderate risk. Factors that increase the risk for postoperative pulmonary complications are obesity, age, asthma, obstructive sleep apnea  Respiratory complications generally occur in 1% of ASA Class I patients, 5% of ASA Class II and 10% of ASA Class III-IV patients These complications rarely result in mortality and include postoperative pneumonia, atelectasis, pulmonary embolism, ARDS and increased time requiring postoperative mechanical ventilation.   Overall, I recommend proceeding with the surgery if the risk for respiratory complications are outweighed by the potential benefits. This will need to be discussed between the patient and surgeon.   To reduce risks of respiratory complications, I recommend: --Pre- and post-operative incentive spirometry performed frequently while awake --Inpatient use of currently prescribed positive-pressure for OSA whenever the patient is sleeping --Short duration of surgery as much as possible and avoid paralytic if possible --OOB, encourage mobility post-op, DVT prophylaxis if indicated    1) RISK FOR PROLONGED MECHANICAL VENTILAION - > 48h  1A) Arozullah - Prolonged mech ventilation risk Arozullah Postperative Pulmonary Risk Score - for mech ventilation dependence >48h [employer], [facility] 2000, major non-cardiac surgery) Comment Score  Type of surgery - abd ao aneurysm (27), thoracic (21), neurosurgery / upper abdominal / vascular (21), neck (11) L TKA 0  Emergency Surgery - (11)  0  ALbumin < 3 or poor nutritional state - (9)  0  BUN > 30 -  (8)  0  Partial or completely dependent functional status - (7)  0  COPD -  (6) asthma 6  Age - 60 to 69 (4), > 70  (6) 70 6  TOTAL  12  Risk Stratifcation scores  - < 10 (0.5%), 11-19 (1.8%), 20-27 (4.2%), 28-40 (10.1%), >40 (26.6%)  1.8%

## 2022-04-26 NOTE — Assessment & Plan Note (Signed)
Excellent compliance and control on auto CPAP 5-15 cmH2O. Residual AHI 1.8. Receives good benefit from use. Continue nightly use. Advised to take CPAP and mask with him to surgery.

## 2022-04-26 NOTE — Assessment & Plan Note (Signed)
Compensated on current regimen. Not on scheduled bronchodilators. Continue prn SABA.  Patient Instructions  Continue Albuterol inhaler 2 puffs every 6 hours as needed for shortness of breath or wheezing. Notify if symptoms persist despite rescue inhaler/neb use. Continue CPAP nightly, minimum of 4-6 hours nightly  Out of bed as soon as possible after surgery. Use incentive spirometer 10 times an hour while awake in the recovery phase.   Good luck with your surgery!!  Follow up in one year with Dr. Ander Slade or sooner if needed

## 2022-04-26 NOTE — Progress Notes (Signed)
$'@Patient'A$  ID: Joseph Tran, male    DOB: Nov 24, 1950, 71 y.o.   MRN: [id number]  Chief Complaint  Patient presents with   Follow-up    Pt f/u for surgical clearance; he is having a total knee replacement on left side.     Referring provider: [name], MD  HPI: 71 year old male, never smoker followed for OSA on CPAP and asthma. He is a patient of Dr. Judson Roch and last seen in office 01/30/2021. 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  TEST/EVENTS:  11/09/2019 HST: AHI 31.8/h, SpO2 low 84%  04/26/2022: Today - surgical clearance  Patient presents today for surgical clearance prior to left knee arthroplasty and one year follow up. He has been doing well since he was seen last. Breathing has been stable. He is not on any maintenance inhalers. 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 Otherwise, he doesn't have to use it. He denies any cough, chest congestion or wheeze. He wears his CPAP nightly without fail. He wakes feeling rested. Denies any drowsy driving or morning headaches.   03/26/2022-04/24/2022 CPAP 5-20 cmH2O  30/30 days; 100% >4 hr; av use 7 hr 17 min Pressure median 8.4, 95th 12 Leaks median 2, 95th 21.3 AHI 1.8  Allergies  Allergen Reactions   Allopurinol Other (See Comments)    PATIENT PREFERENCE Pt refused due to mom developing steven johnson syndrome.   Penicillins Hives    Has patient had a PCN reaction causing immediate rash, facial/tongue/throat swelling, SOB or lightheadedness with hypotension: Unknown Has patient had a PCN reaction causing severe rash involving mucus membranes or skin necrosis:Unknown Has patient had a PCN reaction that required hospitalization: No Has patient had a PCN reaction occurring within the last 10 years: No If all of the above answers are "NO", then may proceed with Cephalosporin  use.    Sulfamethoxazole Hives   Sulfonamide Derivatives Hives   Dilaudid [Hydromorphone Hcl] Nausea And Vomiting    Immunization History  Administered Date(s) Administered   Fluad Quad(high Dose 65+) 03/24/2019   Influenza Split 04/02/2011, 03/16/2012   Influenza, High Dose Seasonal PF 03/25/2017, 04/11/2018   Influenza,inj,Quad PF,6+ Mos 03/27/2013, 03/25/2014, 04/22/2015, 04/28/2016   Influenza-Unspecified 04/30/2020, 03/19/2021, 03/05/2022   PFIZER Comirnaty(Gray Top)Covid-19 Tri-Sucrose Vaccine 11/03/2020   PFIZER(Purple Top)SARS-COV-2 Vaccination 08/19/2019, 09/11/2019, 05/09/2020   Pfizer Covid-19 Vaccine Bivalent Booster 102yr & up 03/31/2021   Pneumococcal Conjugate-13 04/28/2016   Pneumococcal Polysaccharide-23 07/20/2001, 07/20/2005   Tdap 04/02/2011, 08/02/2015   Zoster Recombinat (Shingrix) 08/28/2020, 10/27/2020   Zoster, Live 08/10/2013    Past Medical History:  Diagnosis Date   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 non=functioning.  right kidny is at 50%   [place]    Hx DVT - Xarelto   Colon polyps    Complication of anesthesia    Post op nausea/vomiting   COPD (chronic obstructive pulmonary disease) (HCC)    Depression    severe.  dx 1985   DVT (deep venous thrombosis) (  [place])    sees Dr. [name]    History of kidney stones    has kidney stone now.     Hypertension    Hypogonadism male    sees Dr. Baruch Gouty at [facility]   Neuropathy of both feet    OSA (obstructive sleep apnea)    Plantar fasciitis    rt foot   Pneumonia    PONV (postoperative nausea and vomiting)    Renal atrophy, left    sees Dr. Baruch Gouty at [facility]   Sleep apnea    tested 2010  - wears c-pap     Tobacco History: Social History   Tobacco Use  Smoking Status Never  Smokeless Tobacco Never  Tobacco Comments   NEVER USED TOBACCO   Counseling given: Not Answered Tobacco comments: NEVER USED TOBACCO   Outpatient Medications Prior to Visit  Medication Sig Dispense Refill   acetaminophen (TYLENOL) 500 MG tablet Take 1,000 mg by mouth every 8 (eight) hours as needed for moderate pain or mild pain.     albuterol (PROAIR HFA) 108 (90 Base) MCG/ACT inhaler INHALE 2 PUFFS EVERY 4  HOURS AS NEEDED FOR  WHEEZING OR SHORTNESS OF  BREATH 51 g 3   Apple Cider Vinegar 500 MG TABS Take 600 mg by mouth daily.     aspirin EC 81 MG tablet Take 162 mg by mouth daily.     b complex vitamins capsule Take 1 capsule by mouth daily.     bisacodyl (DULCOLAX) 5 MG EC tablet Take 5 mg by mouth at bedtime.     buPROPion (WELLBUTRIN XL) 300 MG 24 hr tablet TAKE 1 TABLET BY MOUTH  DAILY 90 tablet 3   Cholecalciferol 50 MCG (2000 UT) TABS Take 2,000 Units by mouth daily.     clindamycin (CLEOCIN) 300 MG capsule Take 600 mg by mouth once.     clonazePAM (KLONOPIN) 1 MG tablet Take 1 tablet (1 mg total) by mouth 2 (two) times daily as needed for anxiety. for anxiety 180 tablet 1   desvenlafaxine (PRISTIQ) 50 MG 24 hr tablet Take 1 tablet (50 mg total) by mouth daily. 90 tablet 3   diltiazem (CARDIZEM CD) 240 MG 24 hr capsule Take 240 mg by mouth daily.     diltiazem 2 % GEL Apply 1 application. topically daily as needed (Anal fissuresanal). prn     docusate sodium (COLACE) 100 MG capsule Take 100 mg by mouth at bedtime.     furosemide (LASIX) 20 MG tablet TAKE 1 TABLET BY MOUTH  DAILY AS NEEDED 90 tablet 3   gabapentin (NEURONTIN) 300 MG capsule Take 300 mg by mouth daily.     HYDROcodone-acetaminophen (NORCO) 5-325 MG tablet Take 1-2 tablets by mouth every 6 (six) hours as needed for moderate pain. 30 tablet 0   hydroxypropyl methylcellulose / hypromellose (ISOPTO TEARS / GONIOVISC) 2.5 % ophthalmic  solution Place 1-2 drops into both eyes 3 (three) times daily as needed for dry eyes ((SCHEDULED EACH MORNING)).     ondansetron (ZOFRAN) 8 MG tablet Take 1 tablet (8 mg total) by mouth every 8 (eight) hours as needed for nausea or vomiting. 30 tablet 2   psyllium (METAMUCIL) 58.6 % packet Take 1 packet by mouth at bedtime.     Semaglutide, 1 MG/DOSE, 4 MG/3ML SOPN Inject 1 mg as directed once a week. 3 mL 5   tadalafil (CIALIS) 5 MG tablet Take 1 tablet (5 mg total) by mouth daily  as needed for erectile dysfunction. 10 tablet 0   tamsulosin (FLOMAX) 0.4 MG CAPS capsule Take 1 capsule (0.4 mg total) by mouth daily. 90 capsule 3   traMADol (ULTRAM) 50 MG tablet Take 50 mg by mouth every 6 (six) hours as needed.     XARELTO 20 MG TABS tablet TAKE ONE-HALF TABLET BY  MOUTH DAILY 45 tablet 3   Fluticasone-Salmeterol (ADVAIR DISKUS) 250-50 MCG/DOSE AEPB Inhale 1 puff into the lungs in the morning and at bedtime. (Patient not taking: Reported on 04/26/2022) 60 each 5   No facility-administered medications prior to visit.     Review of Systems:   Constitutional: No weight loss or gain, night sweats, fevers, chills, fatigue, or lassitude. HEENT: No headaches, difficulty swallowing, tooth/dental problems, or sore throat. No sneezing, itching, ear ache, nasal congestion, or post nasal drip CV:  No chest pain, orthopnea, PND, swelling in lower extremities, anasarca, dizziness, palpitations, syncope Resp: +occasional shortness of breath with exertion. No excess mucus or change in color of mucus. No productive or non-productive. No hemoptysis. No wheezing.  No chest wall deformity GI:  No heartburn, indigestion, abdominal pain, nausea, vomiting, diarrhea, change in bowel habits, loss of appetite, bloody stools.  MSK:  +chronic left knee pain. No joint swelling.  No decreased range of motion.  No back pain. Neuro: No dizziness or lightheadedness.  Psych: No depression or anxiety. Mood stable.      Physical Exam:  BP 122/82   Pulse 81   Ht 6' (1.829 m)   Wt 256 lb 6.4 oz (116.3 kg)   SpO2 97%   BMI 34.77 kg/m   GEN: Pleasant, interactive, well-appearing; obese; in no acute distress. HEENT:  Normocephalic and atraumatic. PERRLA. Sclera white. Nasal turbinates pink, moist and patent bilaterally. No rhinorrhea present. Oropharynx pink and moist, without exudate or edema. No lesions, ulcerations, or postnasal drip.  NECK:  Supple w/ fair ROM. No JVD present. Normal carotid impulses w/o bruits. Thyroid symmetrical with no goiter or nodules palpated. No lymphadenopathy.   CV: RRR, no m/r/g, no peripheral edema. Pulses intact, +2 bilaterally. No cyanosis, pallor or clubbing. PULMONARY:  Unlabored, regular breathing. Clear bilaterally A&P w/o wheezes/rales/rhonchi. No accessory muscle use.  GI: BS present and normoactive. Soft, non-tender to palpation. No organomegaly or masses detected.  MSK: No erythema, warmth or tenderness. Cap refil <2 sec all extrem. No deformities or joint swelling noted.  Neuro: A/Ox3. No focal deficits noted.   Skin: Warm, no lesions or rashe Psych: Normal affect and behavior. Judgement and thought content appropriate.     Lab Results:  CBC    Component Value Date/Time   WBC 5.7 03/24/2022 0958   WBC 5.7 09/11/2021 1030   RBC 4.62 03/24/2022 0958   HGB 14.5 03/24/2022 0958   HGB 14.1 06/29/2017 1452   HCT 43.5 03/24/2022 0958   HCT 41.7 06/29/2017 1452   PLT 166 03/24/2022 0958   PLT 216 06/29/2017 1452   MCV 94.2 03/24/2022 0958   MCV 94 06/29/2017 1452   MCH 31.4 03/24/2022 0958   MCHC 33.3 03/24/2022 0958   RDW 12.8 03/24/2022 0958   RDW 12.1 06/29/2017 1452   LYMPHSABS 1.2 03/24/2022 0958   LYMPHSABS 1.0 06/29/2017 1452   MONOABS 0.4 03/24/2022 0958   EOSABS 0.4 03/24/2022 0958   EOSABS 0.2 06/29/2017 1452   BASOSABS 0.1 03/24/2022 0958   BASOSABS 0.1 06/29/2017 1452    BMET    Component Value Date/Time   NA 141 03/24/2022  0958   NA 145 06/29/2017 1452   K 4.4 03/24/2022 0958   K 4.7 06/29/2017 1452   CL 108 03/24/2022 0958   CL 107 06/29/2017 1452   CO2 27 03/24/2022 0958   CO2 25 06/29/2017 1452   GLUCOSE 92 03/24/2022 0958   GLUCOSE 85 06/29/2017 1452   BUN 20 03/24/2022 0958   BUN 33 (H) 06/29/2017 1452   CREATININE 2.01 (H) 03/24/2022 0958   CREATININE 1.56 (H) 06/04/2020 0950   CALCIUM 9.6 03/24/2022 0958   CALCIUM 9.5 06/29/2017 1452   GFRNONAA 35 (L) 03/24/2022 0958   GFRNONAA 45 (L) 06/04/2020 0950   GFRAA 52 (L) 06/04/2020 0950    BNP No results found for: "BNP"   Imaging:  No results found.  cyanocobalamin (VITAMIN B12) injection 1,000 mcg     Date Action Dose Route User   03/05/2022 1424 Given 1,000 mcg Intramuscular (Right Deltoid) Moyun, Karpuih, CMA           No data to display          No results found for: "NITRICOXIDE"      Assessment & Plan:   Asthma Compensated on current regimen. Not on scheduled bronchodilators. Continue prn SABA.  Patient Instructions  Continue Albuterol inhaler 2 puffs every 6 hours as needed for shortness of breath or wheezing. Notify if symptoms persist despite rescue inhaler/neb use. Continue CPAP nightly, minimum of 4-6 hours nightly  Out of bed as soon as possible after surgery. Use incentive spirometer 10 times an hour while awake in the recovery phase.   Good luck with your surgery!!  Follow up in one year with Dr. Ander Slade or sooner if needed     SLEEP APNEA, OBSTRUCTIVE Excellent compliance and control on auto CPAP 5-15 cmH2O. Residual AHI 1.8. Receives good benefit from use. Continue nightly use. Advised to take CPAP and mask with him to surgery.   Preoperative respiratory examination Moderate risk. Factors that increase the risk for postoperative pulmonary complications are obesity, age, asthma, obstructive sleep apnea  Respiratory complications generally occur in 1% of ASA Class I patients, 5% of ASA Class II and  10% of ASA Class III-IV patients These complications rarely result in mortality and include postoperative pneumonia, atelectasis, pulmonary embolism, ARDS and increased time requiring postoperative mechanical ventilation.   Overall, I recommend proceeding with the surgery if the risk for respiratory complications are outweighed by the potential benefits. This will need to be discussed between the patient and surgeon.   To reduce risks of respiratory complications, I recommend: --Pre- and post-operative incentive spirometry performed frequently while awake --Inpatient use of currently prescribed positive-pressure for OSA whenever the patient is sleeping --Short duration of surgery as much as possible and avoid paralytic if possible --OOB, encourage mobility post-op, DVT prophylaxis if indicated    1) RISK FOR PROLONGED MECHANICAL VENTILAION - > 48h  1A) Arozullah - Prolonged mech ventilation risk Arozullah Postperative Pulmonary Risk Score - for mech ventilation dependence >48h [employer], Ann Surg 2000, major non-cardiac surgery) Comment Score  Type of surgery - abd ao aneurysm (27), thoracic (21), neurosurgery / upper abdominal / vascular (21), neck (11) L TKA 0  Emergency Surgery - (11)  0  ALbumin < 3 or poor nutritional state - (9)  0  BUN > 30 -  (8)  0  Partial or completely dependent functional status - (7)  0  COPD -  (6) asthma 6  Age - 60 to 67 (4), > 70  (  6) 70 6  TOTAL  12  Risk Stratifcation scores  - < 10 (0.5%), 11-19 (1.8%), 20-27 (4.2%), 28-40 (10.1%), >40 (26.6%)  1.8%    I spent 28 minutes of dedicated to the care of this patient on the date of this encounter to include pre-visit review of records, face-to-face time with the patient discussing conditions above, post visit ordering of testing, clinical documentation with the electronic health record, making appropriate referrals as documented, and communicating necessary findings to members of the patients care  team.  Clayton Bibles, NP 04/26/2022  Pt aware and understands NP's role.

## 2022-04-27 ENCOUNTER — Other Ambulatory Visit: Payer: Self-pay | Admitting: Family Medicine

## 2022-04-29 NOTE — Telephone Encounter (Signed)
Last refill-sent by historical provider Last OV-03/26/22  No future OV scheduled.

## 2022-05-03 ENCOUNTER — Telehealth: Payer: Self-pay | Admitting: Family Medicine

## 2022-05-03 NOTE — Telephone Encounter (Signed)
He should get it now

## 2022-05-03 NOTE — Telephone Encounter (Signed)
Please advise 

## 2022-05-03 NOTE — Telephone Encounter (Signed)
Pt is calling and has not been sch for knee surgery yet and he would like to know if he should get RSV before or after knee surgery

## 2022-05-04 ENCOUNTER — Ambulatory Visit (INDEPENDENT_AMBULATORY_CARE_PROVIDER_SITE_OTHER): Payer: Medicare Other

## 2022-05-04 DIAGNOSIS — E538 Deficiency of other specified B group vitamins: Secondary | ICD-10-CM | POA: Diagnosis not present

## 2022-05-04 MED ORDER — CYANOCOBALAMIN 1000 MCG/ML IJ SOLN
1000.0000 ug | Freq: Once | INTRAMUSCULAR | Status: AC
  Administered 2022-05-04: 1000 ug via INTRAMUSCULAR

## 2022-05-04 NOTE — Telephone Encounter (Signed)
Spoke with patient about message.

## 2022-05-04 NOTE — Patient Instructions (Signed)
Health Maintenance Due  Topic Date Due   Hepatitis C Screening  Never done   COVID-19 Vaccine (6 - Pfizer series) 07/31/2021      Row Labels 03/26/2022   10:12 AM 02/26/2022   11:22 AM 07/21/2021    8:07 AM  Depression screen PHQ 2/9   Section Header. No data exists in this row.     Decreased Interest   '1 1 2  '$ Down, Depressed, Hopeless   '1 1 1  '$ PHQ - 2 Score   '2 2 3  '$ Altered sleeping   0 0 2  Tired, decreased energy   '3 3 3  '$ Change in appetite   1 0 3  Feeling bad or failure about yourself    '2 2 3  '$ Trouble concentrating   0 1 0  Moving slowly or fidgety/restless   0 0 0  Suicidal thoughts   '1 1 1  '$ PHQ-9 Score   '9 9 15  '$ Difficult doing work/chores   Not difficult at all Somewhat difficult

## 2022-05-04 NOTE — Progress Notes (Signed)
Per orders of [name], MD, injection of B12 given in Left  deltoid by Franco Collet. Patient tolerated injection well.  Lab Results  Component Value Date   [id number] 03/24/2022

## 2022-05-06 ENCOUNTER — Encounter: Payer: Self-pay | Admitting: Family Medicine

## 2022-05-10 ENCOUNTER — Telehealth: Payer: Self-pay

## 2022-05-10 NOTE — Telephone Encounter (Signed)
Last OV for acute reasons on 03/26/22.  Last CPE Jan 2023  Pt notified that appt will be needed for surgical clearance; may need labs or EKG.  Pt verb understanding & appt scheduled for 05/12/22

## 2022-05-11 ENCOUNTER — Telehealth: Payer: Self-pay | Admitting: *Deleted

## 2022-05-11 NOTE — Telephone Encounter (Signed)
Pre operative risk assessment faxed to [facility] at [phone] for upcoming left total knee arthroplasty

## 2022-05-12 ENCOUNTER — Ambulatory Visit (INDEPENDENT_AMBULATORY_CARE_PROVIDER_SITE_OTHER): Payer: Medicare Other | Admitting: Family Medicine

## 2022-05-12 ENCOUNTER — Encounter: Payer: Self-pay | Admitting: Family Medicine

## 2022-05-12 VITALS — BP 122/80 | HR 72 | Temp 98.2°F | Wt 255.0 lb

## 2022-05-12 DIAGNOSIS — M25562 Pain in left knee: Secondary | ICD-10-CM | POA: Diagnosis not present

## 2022-05-12 DIAGNOSIS — I82401 Acute embolism and thrombosis of unspecified deep veins of right lower extremity: Secondary | ICD-10-CM

## 2022-05-12 DIAGNOSIS — J4520 Mild intermittent asthma, uncomplicated: Secondary | ICD-10-CM

## 2022-05-12 DIAGNOSIS — N1831 Chronic kidney disease, stage 3a: Secondary | ICD-10-CM | POA: Diagnosis not present

## 2022-05-12 DIAGNOSIS — G8929 Other chronic pain: Secondary | ICD-10-CM

## 2022-05-13 ENCOUNTER — Telehealth: Payer: Self-pay | Admitting: Family Medicine

## 2022-05-13 NOTE — Telephone Encounter (Signed)
Noted  

## 2022-05-13 NOTE — Telephone Encounter (Signed)
Pt callback and [facility] has open rx for clonazepam and pt will get from [facility] disregard the below message

## 2022-05-13 NOTE — Progress Notes (Signed)
   Subjective:    Patient ID: Joseph Tran, male    DOB: 05-Jun-1951, 71 y.o.   MRN: [id number]  HPI Here to get surgical clearance for a left total knee replacement per Dr. Dorna Leitz. This has not been scheduled yet. He will also be getting clearances from his nephrologist Dr. [name], his pulmonologist Dr. Gwyneth Revels, and from his hematologist Dr. [name]. Other then knee pain, he has no complaints today. He had a normal EKG on 09-11-21.    Review of Systems  Constitutional: Negative.   Respiratory: Negative.    Cardiovascular: Negative.   Gastrointestinal: Negative.   Genitourinary: Negative.   Musculoskeletal:  Positive for arthralgias.  Neurological: Negative.        Objective:   Physical Exam Constitutional:      Appearance: Normal appearance. He is not ill-appearing.  Cardiovascular:     Rate and Rhythm: Normal rate and regular rhythm.     Pulses: Normal pulses.     Heart sounds: Normal heart sounds.  Pulmonary:     Effort: Pulmonary effort is normal.     Breath sounds: Normal breath sounds.  Abdominal:     General: Abdomen is flat. Bowel sounds are normal. There is no distension.     Palpations: Abdomen is soft. There is no mass.     Tenderness: There is no abdominal tenderness. There is no right CVA tenderness, left CVA tenderness, guarding or rebound.     Hernia: No hernia is present.  Musculoskeletal:     Comments: 1+ edema in both ankles   Neurological:     General: No focal deficit present.     Mental Status: He is alert and oriented to person, place, and time.           Assessment & Plan:  He is cleared for the surgery above from my standpoint. He will need a Lovenox bridge prior to the surgery, but Dr. Marin Olp will take care of that.  [name], MD

## 2022-05-13 NOTE — Telephone Encounter (Signed)
Pt is calling and [facility] not have clonazePAM (KLONOPIN) 1 MG tablet  in stock and they do not know when they will get medication back in stock and would like new rx of something else please send to [place], [place] Phone: [phone]  Fax: [phone]

## 2022-05-24 ENCOUNTER — Other Ambulatory Visit: Payer: Self-pay | Admitting: Family Medicine

## 2022-05-25 ENCOUNTER — Other Ambulatory Visit: Payer: Self-pay | Admitting: Orthopedic Surgery

## 2022-06-01 NOTE — Patient Instructions (Signed)
SURGICAL WAITING ROOM VISITATION Patients having surgery or a procedure may have no more than 2 support people in the waiting area - these visitors may rotate.   Children under the age of 37 must have an adult with them who is not the patient. If the patient needs to stay at the hospital during part of their recovery, the visitor guidelines for inpatient rooms apply. Pre-op nurse will coordinate an appropriate time for 1 support person to accompany patient in pre-op.  This support person may not rotate.    Please refer to the [facility] website for the visitor guidelines for Inpatients (after your surgery is over and you are in a regular room).       Your procedure is scheduled on:  06/14/2022    Report to [facility] Main Entrance    Report to admitting at  [place] AM   Call this number if you have problems the morning of surgery [phone]   Do not eat food :After Midnight.   After Midnight you may have the following liquids until _ 0715_____ AM DAY OF SURGERY  Water Non-Citrus Juices (without pulp, NO RED) Carbonated Beverages Black Coffee (NO MILK/CREAM OR CREAMERS, sugar ok)  Clear Tea (NO MILK/CREAM OR CREAMERS, sugar ok) regular and decaf                             Plain Jell-O (NO RED)                                           Fruit ices (not with fruit pulp, NO RED)                                     Popsicles (NO RED)                                                               Sports drinks like Gatorade (NO RED)                    The day of surgery:  Drink ONE (1) Pre-Surgery Clear Ensure or G2 at  0715 AM ( have completed by )  the morning of surgery. Drink in one sitting. Do not sip.  This drink was given to you during your hospital  pre-op appointment visit. Nothing else to drink after completing the  Pre-Surgery Clear Ensure or G2.          If you have questions, please contact your surgeon's office.       Oral Hygiene is also important to  reduce your risk of infection.                                    Remember - BRUSH YOUR TEETH THE MORNING OF SURGERY WITH YOUR REGULAR TOOTHPASTE   Do NOT smoke after Midnight   Take these medicines the morning of surgery with A SIP OF WATER inhalers as usual and bring, flomax, wellbutrin,  pristiq, cardizem    DO NOT TAKE ANY ORAL DIABETIC MEDICATIONS DAY OF YOUR SURGERY  Bring CPAP mask and tubing day of surgery.                              You may not have any metal on your body including hair pins, jewelry, and body piercing             Do not wear make-up, lotions, powders, perfumes/cologne, or deodorant  Do not wear nail polish including gel and S&S, artificial/acrylic nails, or any other type of covering on natural nails including finger and toenails. If you have artificial nails, gel coating, etc. that needs to be removed by a nail salon please have this removed prior to surgery or surgery may need to be canceled/ delayed if the surgeon/ anesthesia feels like they are unable to be safely monitored.   Do not shave  48 hours prior to surgery.               Men may shave face and neck.   Do not bring valuables to the hospital. [place].   Contacts, dentures or bridgework may not be worn into surgery.   Bring small overnight bag day of surgery.   DO NOT [place]. PHARMACY WILL DISPENSE MEDICATIONS LISTED ON YOUR MEDICATION LIST TO YOU DURING YOUR ADMISSION [place]!    Patients discharged on the day of surgery will not be allowed to drive home.  Someone NEEDS to stay with you for the first 24 hours after anesthesia.   Special Instructions: Bring a copy of your healthcare power of attorney and living will documents the day of surgery if you haven't scanned them before.              Please read over the following fact sheets you were given: IF [place] [phone]   If you received a COVID test during your pre-op visit  it is requested that you wear a mask when out in public, stay away from anyone that may not be feeling well and notify your surgeon if you develop symptoms. If you test positive for Covid or have been in contact with anyone that has tested positive in the last 10 days please notify you surgeon.    [facility] - Preparing for Surgery Before surgery, you can play an important role.  Because skin is not sterile, your skin needs to be as free of germs as possible.  You can reduce the number of germs on your skin by washing with CHG (chlorahexidine gluconate) soap before surgery.  CHG is an antiseptic cleaner which kills germs and bonds with the skin to continue killing germs even after washing. Please DO NOT use if you have an allergy to CHG or antibacterial soaps.  If your skin becomes reddened/irritated stop using the CHG and inform your nurse when you arrive at Short Stay. Do not shave (including legs and underarms) for at least 48 hours prior to the first CHG shower.  You may shave your face/neck. Please follow these instructions carefully:  1.  Shower with CHG Soap the night before surgery and the  morning of Surgery.  2.  If you choose to wash your hair, wash your  hair first as usual with your  normal  shampoo.  3.  After you shampoo, rinse your hair and body thoroughly to remove the  shampoo.                           4.  Use CHG as you would any other liquid soap.  You can apply chg directly  to the skin and wash                       Gently with a scrungie or clean washcloth.  5.  Apply the CHG Soap to your body ONLY FROM THE NECK DOWN.   Do not use on face/ open                           Wound or open sores. Avoid contact with eyes, ears mouth and genitals (private parts).                       Wash face,  Genitals (private parts) with your normal soap.             6.  Wash thoroughly, paying special  attention to the area where your surgery  will be performed.  7.  Thoroughly rinse your body with warm water from the neck down.  8.  DO NOT shower/wash with your normal soap after using and rinsing off  the CHG Soap.                9.  Pat yourself dry with a clean towel.            10.  Wear clean pajamas.            11.  Place clean sheets on your bed the night of your first shower and do not  sleep with pets. Day of Surgery : Do not apply any lotions/deodorants the morning of surgery.  Please wear clean clothes to the hospital/surgery center.  FAILURE TO FOLLOW THESE INSTRUCTIONS MAY RESULT IN THE CANCELLATION OF YOUR SURGERY PATIENT SIGNATURE_________________________________  NURSE SIGNATURE__________________________________  ________________________________________________________________________

## 2022-06-01 NOTE — Progress Notes (Signed)
Anesthesia Review:  PCP: [name]- [facility] 05/12/22  Cardiologist : Pulmonology- [name], NP [place] 04/26/22  Chest x-ray : EKG : 09/11/21  Echo : Stress test: Cardiac Cath :  Activity level:  Sleep Study/ CPAP : Fasting Blood Sugar :      / Checks Blood Sugar -- times a day:   Blood Thinner/ Instructions /Last Dose: ASA / Instructions/ Last Dose :  81 mg aspiirn   Ozempic

## 2022-06-02 ENCOUNTER — Telehealth: Payer: Self-pay

## 2022-06-02 MED ORDER — CLONAZEPAM 1 MG PO TABS: 1.0000 mg | ORAL_TABLET | Freq: Two times a day (BID) | ORAL | 1 refills | Status: DC | PRN

## 2022-06-02 NOTE — Addendum Note (Signed)
Addended by: [name] A on: 06/02/2022 01:52 PM   Modules accepted: Orders

## 2022-06-02 NOTE — Telephone Encounter (Signed)
Message sent to PCP for approval

## 2022-06-02 NOTE — Telephone Encounter (Signed)
Sent to [facility].

## 2022-06-03 ENCOUNTER — Encounter (HOSPITAL_COMMUNITY): Payer: Self-pay

## 2022-06-03 ENCOUNTER — Ambulatory Visit (HOSPITAL_COMMUNITY)
Admission: RE | Admit: 2022-06-03 | Discharge: 2022-06-03 | Disposition: A | Discharge status: Home / Self Care | Payer: Medicare Other | Source: Ambulatory Visit | Attending: Orthopedic Surgery | Admitting: Orthopedic Surgery

## 2022-06-03 ENCOUNTER — Encounter (HOSPITAL_COMMUNITY)
Admission: RE | Admit: 2022-06-03 | Discharge: 2022-06-03 | Disposition: A | Discharge status: Home / Self Care | Payer: Medicare Other | Source: Ambulatory Visit | Attending: Orthopedic Surgery | Admitting: Orthopedic Surgery

## 2022-06-03 ENCOUNTER — Other Ambulatory Visit: Payer: Self-pay

## 2022-06-03 VITALS — BP 143/90 | HR 72 | Temp 97.7°F | Resp 16 | Ht 72.0 in | Wt 252.0 lb

## 2022-06-03 DIAGNOSIS — Z01818 Encounter for other preprocedural examination: Secondary | ICD-10-CM | POA: Diagnosis not present

## 2022-06-03 LAB — CBC
HCT: 45.3 % (ref 39.0–52.0)
Hemoglobin: 14.9 g/dL (ref 13.0–17.0)
MCH: 30.9 pg (ref 26.0–34.0)
MCHC: 32.9 g/dL (ref 30.0–36.0)
MCV: 94 fL (ref 80.0–100.0)
Platelets: 210 10*3/uL (ref 150–400)
RBC: 4.82 MIL/uL (ref 4.22–5.81)
RDW: 12.3 % (ref 11.5–15.5)
WBC: 6.1 10*3/uL (ref 4.0–10.5)
nRBC: 0 % (ref 0.0–0.2)

## 2022-06-03 LAB — BASIC METABOLIC PANEL
Anion gap: 6 (ref 5–15)
BUN: 22 mg/dL (ref 8–23)
CO2: 26 mmol/L (ref 22–32)
Calcium: 9.5 mg/dL (ref 8.9–10.3)
Chloride: 106 mmol/L (ref 98–111)
Creatinine, Ser: 1.92 mg/dL — ABNORMAL HIGH (ref 0.61–1.24)
GFR, Estimated: 37 mL/min — ABNORMAL LOW (ref >60)
Glucose, Bld: 86 mg/dL (ref 70–99)
Potassium: 4.1 mmol/L (ref 3.5–5.1)
Sodium: 138 mmol/L (ref 135–145)

## 2022-06-03 LAB — SURGICAL PCR SCREEN
MRSA, PCR: NEGATIVE
Staphylococcus aureus: NEGATIVE

## 2022-06-03 NOTE — Telephone Encounter (Signed)
Pt.notified

## 2022-06-04 ENCOUNTER — Ambulatory Visit (INDEPENDENT_AMBULATORY_CARE_PROVIDER_SITE_OTHER): Payer: Medicare Other | Admitting: *Deleted

## 2022-06-04 DIAGNOSIS — E538 Deficiency of other specified B group vitamins: Secondary | ICD-10-CM

## 2022-06-04 MED ORDER — CYANOCOBALAMIN 1000 MCG/ML IJ SOLN
1000.0000 ug | Freq: Once | INTRAMUSCULAR | Status: AC
  Administered 2022-06-04: 1000 ug via INTRAMUSCULAR

## 2022-06-04 NOTE — Progress Notes (Signed)
Per orders of Dr. Fry, injection of Cyanocobalamin 1000mcg given by Bubba Vanbenschoten A. Patient tolerated injection well.  

## 2022-06-07 NOTE — Progress Notes (Addendum)
Anesthesia Chart Review   Case: [id number] Date/Time: 06/14/22 0945   Procedure: TOTAL KNEE ARTHROPLASTY (Left: Knee)   Anesthesia type: Spinal   Pre-op diagnosis: LEFT KNEE OSTEOARTHRITIS   Location: [place] 06 / [facility]   Surgeons: Dorna Leitz, MD       DISCUSSION:70 y.o. never smoker with h/o HTN, PONV, sleep apnea, CKD, OSA, DVT on Xarelto, left knee OA scheduled for above procedure 06/14/2022 with Dr. Dorna Leitz.   Last dose of Ozempic 06/05/2022.   Pt reports last dose of Xarelto 06/10/22.   Pt seen by PCP 05/12/2022 for preoperative evaluation.  Per Dr. Sarajane Jews he is cleared for surgery.   Pt seen by pulmonology 04/26/2022. Per OV Note, "Moderate risk. Factors that increase the risk for postoperative pulmonary complications are obesity, age, asthma, obstructive sleep apnea   Respiratory complications generally occur in 1% of ASA Class I patients, 5% of ASA Class II and 10% of ASA Class III-IV patients These complications rarely result in mortality and include postoperative pneumonia, atelectasis, pulmonary embolism, ARDS and increased time requiring postoperative mechanical ventilation.   Overall, I recommend proceeding with the surgery if the risk for respiratory complications are outweighed by the potential benefits. This will need to be discussed between the patient and surgeon.   To reduce risks of respiratory complications, I recommend: --Pre- and post-operative incentive spirometry performed frequently while awake --Inpatient use of currently prescribed positive-pressure for OSA whenever the patient is sleeping --Short duration of surgery as much as possible and avoid paralytic if possible --OOB, encourage mobility post-op, DVT prophylaxis if indicated"  Anticipate pt can proceed with planned procedure barring acute status change.   VS: BP (!) 143/90   Pulse 72   Temp 36.5 C (Oral)   Resp 16   Ht 6' (1.829 m)   Wt 114.3 kg   SpO2 97%   BMI 34.18 kg/m    PROVIDERS: [name], MD is PCP    LABS: Labs reviewed: Acceptable for surgery. 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  right kidny is at 50%   Clotting disorder ([place])    Hx DVT - Xarelto   Colon polyps    Complication of anesthesia    Post op nausea/vomiting   Depression    severe.  dx 1985   DVT (deep venous thrombosis) ([place])    sees Dr. [name]    History of kidney stones    has kidney stone now.     Hypertension    Hypogonadism male    [facility]   Neuropathy of both feet    OSA (obstructive sleep apnea)    Plantar fasciitis  rt foot   Pneumonia    PONV (postoperative nausea and vomiting)    Renal atrophy, left    se[facility]. Baruch [facility]outy at [facility]   Sleep apnea    tested 2010  - wears c-pap    Past Surgical History:  Procedure Laterality Date   CHEST WALL TUMOR EXCISION  2007   COLONOSCOPY  04/07/2020   per Dr. Hilarie Fredrickson, adenomatous polyps, repeat in 3 yrs    kidney caluculs  2004-05   KIDNEY STONE SURGERY     x 2c   KNEE ARTHROSCOPY  09/10/2011   right knee, per Dr. Dorna Leitz    KNEE ARTHROSCOPY Right 04/25/2017   Procedure: RIGHT KNEE ARTHROSCOPY, PARTIAL LATERAL MENISECTOMY, CHONDROPLASTY MEDIAL AND LATERAL PATELLAOFEMORAL;  Surgeon: Dorna Leitz, MD;  Location: [facility];   Service: Orthopedics;  Laterality: Right;   KNEE ARTHROSCOPY Left 09/18/2021   Procedure: ARTHROSCOPY KNEE;  Surgeon: Dorna Leitz, MD;  Location[facility];  Service: Orthopedics;  Laterality: Left;   KNEE ARTHROSCOPY WITH MEDIAL MENISECTOMY Left 09/18/2021   Procedure: KNEE ARTHROSCOPY WITH MEDIAL MENISECTOMY;  Surgeon: Dorna Leitz, MD;  Location: [facility];  Service: Orthopedics;  Laterality: Left;   madiscus cartilage  2009   MOHS SURGERY     right knee arthroscopy  2008   TONSILLECTOMY     TOTAL KNEE ARTHROPLASTY Right 01/19/2019   Procedure: RIGHT TOTAL KNEE ARTHROPLASTY;  Surgeon: Dorna Leitz, MD;  [facility];  Service: Orthopedics;  Laterality: Right;   UPPER GASTROINTESTINAL ENDOSCOPY      MEDICATIONS:  acetaminophen (TYLENOL) 500 MG tablet   albuterol (PROAIR HFA) 108 (90 Base) MCG/ACT inhaler   Apple Cider Vinegar 500 MG TABS   aspirin EC 81 MG tablet   b complex vitamins capsule   bisacodyl (DULCOLAX) 5 MG EC tablet   buPROPion (WELLBUTRIN XL) 300 MG 24 hr tablet   Cholecalciferol 50 MCG (2000 UT) TABS   clindamycin (CLEOCIN) 300 MG capsule   clonazePAM (KLONOPIN) 1 MG tablet   desvenlafaxine (PRISTIQ) 50 MG 24 hr tablet   diltiazem (CARDIZEM CD) 240 MG 24 hr capsule   diltiazem 2 % GEL   docusate sodium (COLACE) 100 MG capsule   furosemide (LASIX) 20 MG tablet   gabapentin (NEURONTIN) 300 MG capsule   hydroxypropyl methylcellulose / hypromellose (ISOPTO TEARS / GONIOVISC) 2.5 % ophthalmic solution   ondansetron (ZOFRAN) 8 MG tablet   psyllium (METAMUCIL) 58.6 % packet   Semaglutide, 1 MG/DOSE, 4 MG/3ML SOPN   tadalafil (CIALIS) 5 MG tablet   tamsulosin (FLOMAX) 0.4 MG CAPS capsule   traMADol (ULTRAM) 50 MG tablet   XARELTO 20 MG TABS tablet   No current facility-administered medications for this encounter.     Konrad Felix Ward, PA-C [facility] [phone]

## 2022-06-08 NOTE — Care Plan (Signed)
Ortho Bundle Case Management Note  Patient Details  Name: CARLESS SLATTEN MRN: [id number] Date of Birth: 1951-01-12   spoke with patient. he has an extensive history. all reviewed. 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 will discharge to home with family to assist. has rolling walker. HHPT referral to [facility]. OPPT set up with [facility]. discharge instructions discussed and mailed to patient. questions answered. discussed with MD. Choice offered                   DME Arranged:    DME Agency:     HH Arranged:  PT HH Agency:  [place]  Additional Comments: Please contact me with any questions of if this plan should need to change.  Ladell Heads,  [place] Specialist  [phone] 06/08/2022, 3:47 PM

## 2022-06-11 NOTE — Progress Notes (Addendum)
Called Pt for time change of arrival to short Stay 06/14/2022. He had questions concerning Xarelto and Lovenox bridge.  Consulted Murvin Natal, PA who states she will follow up with Pt.

## 2022-06-11 NOTE — Anesthesia Preprocedure Evaluation (Signed)
Anesthesia Evaluation  Patient identified by MRN, date of birth, ID band Patient awake    Reviewed: Allergy & Precautions, NPO status , Patient's Chart, lab work & pertinent test results  History of Anesthesia Complications (+) PONV  Airway Mallampati: II  TM Distance: >3 FB Neck ROM: Full    Dental  (+) Dental Advisory Given   Pulmonary asthma , sleep apnea and Continuous Positive Airway Pressure Ventilation    breath sounds clear to auscultation       Cardiovascular hypertension, Pt. on medications (-) angina + DVT   Rhythm:Regular Rate:Normal     Neuro/Psych   Anxiety Depression    H/o acoustic neuroma    GI/Hepatic negative GI ROS, Neg liver ROS,,,  Endo/Other    Morbid obesityLast ozempic 06/05/2022  Renal/GU Renal InsufficiencyRenal disease     Musculoskeletal  (+) Arthritis ,    Abdominal  (+) + obese  Peds  Hematology Xarelto: last dose 12/7   Anesthesia Other Findings   Reproductive/Obstetrics                              Anesthesia Physical Anesthesia Plan  ASA: 3  Anesthesia Plan: Spinal   Post-op Pain Management: Regional block* and Tylenol PO (pre-op)*   Induction:   PONV Risk Score and Plan: 2 and Ondansetron, Treatment may vary due to age or medical condition and Dexamethasone  Airway Management Planned: Natural Airway and Simple Face Mask  Additional Equipment: None  Intra-op Plan:   Post-operative Plan:   Informed Consent: I have reviewed the patients History and Physical, chart, labs and discussed the procedure including the risks, benefits and alternatives for the proposed anesthesia with the patient or authorized representative who has indicated his/her understanding and acceptance.     Dental advisory given  Plan Discussed with: CRNA and Surgeon  Anesthesia Plan Comments: (See PAT note 06/03/2022 Plan routine monitors, SAB with adductor canal  block)        Anesthesia Quick Evaluation

## 2022-06-14 ENCOUNTER — Ambulatory Visit (HOSPITAL_BASED_OUTPATIENT_CLINIC_OR_DEPARTMENT_OTHER): Payer: Medicare Other | Admitting: Anesthesiology

## 2022-06-14 ENCOUNTER — Encounter (HOSPITAL_COMMUNITY)
Admission: RE | Disposition: A | Discharge status: Home / Self Care | Payer: Self-pay | Source: Ambulatory Visit | Attending: Orthopedic Surgery

## 2022-06-14 ENCOUNTER — Ambulatory Visit (HOSPITAL_COMMUNITY)
Admission: RE | Admit: 2022-06-14 | Discharge: 2022-06-14 | Disposition: A | Discharge status: Home / Self Care | Payer: Medicare Other | Source: Ambulatory Visit | Attending: Orthopedic Surgery | Admitting: Orthopedic Surgery

## 2022-06-14 ENCOUNTER — Other Ambulatory Visit: Payer: Self-pay

## 2022-06-14 ENCOUNTER — Ambulatory Visit (HOSPITAL_COMMUNITY): Payer: Medicare Other | Admitting: Physician Assistant

## 2022-06-14 DIAGNOSIS — M1712 Unilateral primary osteoarthritis, left knee: Secondary | ICD-10-CM

## 2022-06-14 DIAGNOSIS — G4733 Obstructive sleep apnea (adult) (pediatric): Secondary | ICD-10-CM | POA: Insufficient documentation

## 2022-06-14 DIAGNOSIS — Z6834 Body mass index (BMI) 34.0-34.9, adult: Secondary | ICD-10-CM | POA: Insufficient documentation

## 2022-06-14 DIAGNOSIS — Z86718 Personal history of other venous thrombosis and embolism: Secondary | ICD-10-CM | POA: Diagnosis not present

## 2022-06-14 DIAGNOSIS — I509 Heart failure, unspecified: Secondary | ICD-10-CM | POA: Insufficient documentation

## 2022-06-14 DIAGNOSIS — Z96652 Presence of left artificial knee joint: Secondary | ICD-10-CM | POA: Diagnosis not present

## 2022-06-14 DIAGNOSIS — I130 Hypertensive heart and chronic kidney disease with heart failure and stage 1 through stage 4 chronic kidney disease, or unspecified chronic kidney disease: Secondary | ICD-10-CM | POA: Insufficient documentation

## 2022-06-14 DIAGNOSIS — E1122 Type 2 diabetes mellitus with diabetic chronic kidney disease: Secondary | ICD-10-CM | POA: Insufficient documentation

## 2022-06-14 DIAGNOSIS — F418 Other specified anxiety disorders: Secondary | ICD-10-CM | POA: Diagnosis not present

## 2022-06-14 DIAGNOSIS — I82401 Acute embolism and thrombosis of unspecified deep veins of right lower extremity: Secondary | ICD-10-CM

## 2022-06-14 DIAGNOSIS — M21162 Varus deformity, not elsewhere classified, left knee: Secondary | ICD-10-CM | POA: Diagnosis not present

## 2022-06-14 DIAGNOSIS — I10 Essential (primary) hypertension: Secondary | ICD-10-CM | POA: Diagnosis not present

## 2022-06-14 DIAGNOSIS — J4489 Other specified chronic obstructive pulmonary disease: Secondary | ICD-10-CM | POA: Insufficient documentation

## 2022-06-14 DIAGNOSIS — F419 Anxiety disorder, unspecified: Secondary | ICD-10-CM | POA: Diagnosis not present

## 2022-06-14 DIAGNOSIS — Z01818 Encounter for other preprocedural examination: Secondary | ICD-10-CM

## 2022-06-14 DIAGNOSIS — N183 Chronic kidney disease, stage 3 unspecified: Secondary | ICD-10-CM | POA: Diagnosis not present

## 2022-06-14 DIAGNOSIS — Z86018 Personal history of other benign neoplasm: Secondary | ICD-10-CM | POA: Diagnosis not present

## 2022-06-14 DIAGNOSIS — G8918 Other acute postprocedural pain: Secondary | ICD-10-CM | POA: Diagnosis not present

## 2022-06-14 DIAGNOSIS — S83142A Lateral subluxation of proximal end of tibia, left knee, initial encounter: Secondary | ICD-10-CM | POA: Diagnosis not present

## 2022-06-14 DIAGNOSIS — Z7901 Long term (current) use of anticoagulants: Secondary | ICD-10-CM | POA: Diagnosis not present

## 2022-06-14 HISTORY — PX: TOTAL KNEE ARTHROPLASTY: SHX125

## 2022-06-14 LAB — GLUCOSE, CAPILLARY: Glucose-Capillary: 87 mg/dL (ref 70–99)

## 2022-06-14 SURGERY — ARTHROPLASTY, KNEE, TOTAL
Anesthesia: Spinal | Site: Knee | Laterality: Left

## 2022-06-14 MED ORDER — HYDROMORPHONE HCL 1 MG/ML IJ SOLN: 0.2500 mg | INTRAMUSCULAR | Status: DC | PRN

## 2022-06-14 MED ORDER — PROPOFOL 1000 MG/100ML IV EMUL
INTRAVENOUS | Status: AC
  Filled 2022-06-14: qty 100

## 2022-06-14 MED ORDER — MORPHINE SULFATE (PF) 2 MG/ML IV SOLN: 0.5000 mg | INTRAVENOUS | Status: DC | PRN

## 2022-06-14 MED ORDER — LACTATED RINGERS IV BOLUS
250.0000 mL | Freq: Once | INTRAVENOUS | Status: AC
  Administered 2022-06-14: 250 mL via INTRAVENOUS

## 2022-06-14 MED ORDER — TIZANIDINE HCL 2 MG PO TABS: 2.0000 mg | ORAL_TABLET | Freq: Three times a day (TID) | ORAL | 0 refills | Status: DC | PRN

## 2022-06-14 MED ORDER — DOCUSATE SODIUM 100 MG PO CAPS: 100.0000 mg | ORAL_CAPSULE | Freq: Two times a day (BID) | ORAL | 0 refills | Status: DC

## 2022-06-14 MED ORDER — LACTATED RINGERS IV BOLUS
500.0000 mL | Freq: Once | INTRAVENOUS | Status: AC
  Administered 2022-06-14: 500 mL via INTRAVENOUS

## 2022-06-14 MED ORDER — CEFAZOLIN SODIUM-DEXTROSE 2-3 GM-%(50ML) IV SOLR
INTRAVENOUS | Status: DC | PRN
  Administered 2022-06-14: 2 g via INTRAVENOUS

## 2022-06-14 MED ORDER — PROPOFOL 10 MG/ML IV BOLUS
INTRAVENOUS | Status: AC
  Filled 2022-06-14: qty 20

## 2022-06-14 MED ORDER — BUPIVACAINE IN DEXTROSE 0.75-8.25 % IT SOLN
INTRATHECAL | Status: DC | PRN
  Administered 2022-06-14: 2 mL via INTRATHECAL

## 2022-06-14 MED ORDER — ROPIVACAINE HCL 7.5 MG/ML IJ SOLN
INTRAMUSCULAR | Status: DC | PRN
  Administered 2022-06-14: 20 mL via PERINEURAL

## 2022-06-14 MED ORDER — MEPERIDINE HCL 50 MG/ML IJ SOLN: 6.2500 mg | INTRAMUSCULAR | Status: DC | PRN

## 2022-06-14 MED ORDER — OXYCODONE HCL 5 MG/5ML PO SOLN: 5.0000 mg | Freq: Once | ORAL | Status: DC | PRN

## 2022-06-14 MED ORDER — ORAL CARE MOUTH RINSE: 15.0000 mL | Freq: Once | OROMUCOSAL | Status: AC

## 2022-06-14 MED ORDER — POVIDONE-IODINE 10 % EX SWAB
2.0000 | Freq: Once | CUTANEOUS | Status: AC
  Administered 2022-06-14: 2 via TOPICAL

## 2022-06-14 MED ORDER — PROMETHAZINE HCL 25 MG/ML IJ SOLN: 6.2500 mg | INTRAMUSCULAR | Status: DC | PRN

## 2022-06-14 MED ORDER — SODIUM CHLORIDE 0.9 % IR SOLN
Status: DC | PRN
  Administered 2022-06-14: 1000 mL

## 2022-06-14 MED ORDER — MIDAZOLAM HCL 2 MG/2ML IJ SOLN: 0.5000 mg | Freq: Once | INTRAMUSCULAR | Status: DC | PRN

## 2022-06-14 MED ORDER — OXYCODONE-ACETAMINOPHEN 5-325 MG PO TABS: 1.0000 | ORAL_TABLET | Freq: Four times a day (QID) | ORAL | 0 refills | Status: DC | PRN

## 2022-06-14 MED ORDER — PHENYLEPHRINE HCL-NACL 20-0.9 MG/250ML-% IV SOLN
INTRAVENOUS | Status: DC | PRN
  Administered 2022-06-14: 50 ug/min via INTRAVENOUS

## 2022-06-14 MED ORDER — ACETAMINOPHEN 500 MG PO TABS
500.0000 mg | ORAL_TABLET | Freq: Four times a day (QID) | ORAL | Status: DC
  Administered 2022-06-14: 500 mg via ORAL

## 2022-06-14 MED ORDER — MIDAZOLAM HCL 2 MG/2ML IJ SOLN
INTRAMUSCULAR | Status: AC
  Filled 2022-06-14: qty 2

## 2022-06-14 MED ORDER — TRANEXAMIC ACID-NACL 1000-0.7 MG/100ML-% IV SOLN
1000.0000 mg | INTRAVENOUS | Status: AC
  Administered 2022-06-14: 1000 mg via INTRAVENOUS
  Filled 2022-06-14: qty 100

## 2022-06-14 MED ORDER — HYDROCODONE-ACETAMINOPHEN 5-325 MG PO TABS: 1.0000 | ORAL_TABLET | ORAL | Status: DC | PRN

## 2022-06-14 MED ORDER — BUPIVACAINE-EPINEPHRINE 0.5% -1:200000 IJ SOLN
INTRAMUSCULAR | Status: DC | PRN
  Administered 2022-06-14: 30 mL

## 2022-06-14 MED ORDER — DEXAMETHASONE SODIUM PHOSPHATE 10 MG/ML IJ SOLN
INTRAMUSCULAR | Status: AC
  Filled 2022-06-14: qty 1

## 2022-06-14 MED ORDER — LIDOCAINE 2% (20 MG/ML) 5 ML SYRINGE
INTRAMUSCULAR | Status: DC | PRN
  Administered 2022-06-14: 40 mg via INTRAVENOUS

## 2022-06-14 MED ORDER — ACETAMINOPHEN 500 MG PO TABS
1000.0000 mg | ORAL_TABLET | Freq: Once | ORAL | Status: AC
  Administered 2022-06-14: 1000 mg via ORAL
  Filled 2022-06-14: qty 2

## 2022-06-14 MED ORDER — SODIUM CHLORIDE 0.9% FLUSH
INTRAVENOUS | Status: DC | PRN
  Administered 2022-06-14: 50 mL

## 2022-06-14 MED ORDER — TRANEXAMIC ACID-NACL 1000-0.7 MG/100ML-% IV SOLN
INTRAVENOUS | Status: AC
  Filled 2022-06-14: qty 100

## 2022-06-14 MED ORDER — PROPOFOL 500 MG/50ML IV EMUL
INTRAVENOUS | Status: AC
  Filled 2022-06-14: qty 50

## 2022-06-14 MED ORDER — LIDOCAINE HCL (PF) 2 % IJ SOLN
INTRAMUSCULAR | Status: AC
  Filled 2022-06-14: qty 5

## 2022-06-14 MED ORDER — OXYCODONE HCL 5 MG PO TABS: 5.0000 mg | ORAL_TABLET | Freq: Once | ORAL | Status: DC | PRN

## 2022-06-14 MED ORDER — ONDANSETRON HCL 4 MG PO TABS
4.0000 mg | ORAL_TABLET | Freq: Four times a day (QID) | ORAL | Status: DC | PRN
  Filled 2022-06-14: qty 1

## 2022-06-14 MED ORDER — BUPIVACAINE-EPINEPHRINE (PF) 0.5% -1:200000 IJ SOLN
INTRAMUSCULAR | Status: AC
  Filled 2022-06-14: qty 30

## 2022-06-14 MED ORDER — FENTANYL CITRATE (PF) 100 MCG/2ML IJ SOLN
INTRAMUSCULAR | Status: AC
  Filled 2022-06-14: qty 2

## 2022-06-14 MED ORDER — METHOCARBAMOL 500 MG PO TABS: 500.0000 mg | ORAL_TABLET | Freq: Four times a day (QID) | ORAL | Status: DC | PRN

## 2022-06-14 MED ORDER — VANCOMYCIN HCL IN DEXTROSE 1-5 GM/200ML-% IV SOLN
1000.0000 mg | INTRAVENOUS | Status: DC
  Filled 2022-06-14: qty 200

## 2022-06-14 MED ORDER — FENTANYL CITRATE (PF) 100 MCG/2ML IJ SOLN
INTRAMUSCULAR | Status: DC | PRN
  Administered 2022-06-14: 50 ug via INTRAVENOUS

## 2022-06-14 MED ORDER — SODIUM CHLORIDE (PF) 0.9 % IJ SOLN
INTRAMUSCULAR | Status: AC
  Filled 2022-06-14: qty 50

## 2022-06-14 MED ORDER — PROPOFOL 10 MG/ML IV BOLUS
INTRAVENOUS | Status: DC | PRN
  Administered 2022-06-14: 20 mg via INTRAVENOUS

## 2022-06-14 MED ORDER — ONDANSETRON HCL 4 MG/2ML IJ SOLN
INTRAMUSCULAR | Status: DC | PRN
  Administered 2022-06-14: 4 mg via INTRAVENOUS

## 2022-06-14 MED ORDER — TRANEXAMIC ACID-NACL 1000-0.7 MG/100ML-% IV SOLN
1000.0000 mg | Freq: Once | INTRAVENOUS | Status: AC
  Administered 2022-06-14: 1000 mg via INTRAVENOUS

## 2022-06-14 MED ORDER — WATER FOR IRRIGATION, STERILE IR SOLN
Status: DC | PRN
  Administered 2022-06-14: 2000 mL

## 2022-06-14 MED ORDER — BUPIVACAINE LIPOSOME 1.3 % IJ SUSP
INTRAMUSCULAR | Status: AC
  Filled 2022-06-14: qty 20

## 2022-06-14 MED ORDER — ONDANSETRON HCL 4 MG/2ML IJ SOLN
INTRAMUSCULAR | Status: AC
  Filled 2022-06-14: qty 2

## 2022-06-14 MED ORDER — ACETAMINOPHEN 500 MG PO TABS
ORAL_TABLET | ORAL | Status: AC
  Filled 2022-06-14: qty 2

## 2022-06-14 MED ORDER — LACTATED RINGERS IV SOLN: INTRAVENOUS | Status: DC

## 2022-06-14 MED ORDER — ONDANSETRON HCL 4 MG/2ML IJ SOLN: 4.0000 mg | Freq: Four times a day (QID) | INTRAMUSCULAR | Status: DC | PRN

## 2022-06-14 MED ORDER — DEXAMETHASONE SODIUM PHOSPHATE 10 MG/ML IJ SOLN
INTRAMUSCULAR | Status: DC | PRN
  Administered 2022-06-14: 8 mg via INTRAVENOUS

## 2022-06-14 MED ORDER — RIVAROXABAN 2.5 MG PO TABS: 5.0000 mg | ORAL_TABLET | Freq: Every day | ORAL | Status: DC

## 2022-06-14 MED ORDER — KETOROLAC TROMETHAMINE 15 MG/ML IJ SOLN
INTRAMUSCULAR | Status: AC
  Filled 2022-06-14: qty 1

## 2022-06-14 MED ORDER — KETOROLAC TROMETHAMINE 15 MG/ML IJ SOLN
15.0000 mg | Freq: Four times a day (QID) | INTRAMUSCULAR | Status: AC
  Administered 2022-06-14: 15 mg via INTRAVENOUS

## 2022-06-14 MED ORDER — LACTATED RINGERS IV BOLUS: 250.0000 mL | Freq: Once | INTRAVENOUS | Status: DC | PRN

## 2022-06-14 MED ORDER — BUPIVACAINE LIPOSOME 1.3 % IJ SUSP: 20.0000 mL | Freq: Once | INTRAMUSCULAR | Status: DC

## 2022-06-14 MED ORDER — 0.9 % SODIUM CHLORIDE (POUR BTL) OPTIME
TOPICAL | Status: DC | PRN
  Administered 2022-06-14: 1000 mL

## 2022-06-14 MED ORDER — EPHEDRINE SULFATE-NACL 50-0.9 MG/10ML-% IV SOSY
PREFILLED_SYRINGE | INTRAVENOUS | Status: DC | PRN
  Administered 2022-06-14 (×2): 5 mg via INTRAVENOUS

## 2022-06-14 MED ORDER — CEFAZOLIN SODIUM-DEXTROSE 2-4 GM/100ML-% IV SOLN
2.0000 g | Freq: Four times a day (QID) | INTRAVENOUS | Status: AC
  Administered 2022-06-14: 2 g via INTRAVENOUS

## 2022-06-14 MED ORDER — MIDAZOLAM HCL 2 MG/2ML IJ SOLN
INTRAMUSCULAR | Status: DC | PRN
  Administered 2022-06-14: 2 mg via INTRAVENOUS

## 2022-06-14 MED ORDER — ACETAMINOPHEN 325 MG PO TABS: 325.0000 mg | ORAL_TABLET | Freq: Four times a day (QID) | ORAL | Status: DC | PRN

## 2022-06-14 MED ORDER — CEFAZOLIN SODIUM-DEXTROSE 2-4 GM/100ML-% IV SOLN
INTRAVENOUS | Status: AC
  Filled 2022-06-14: qty 100

## 2022-06-14 MED ORDER — CHLORHEXIDINE GLUCONATE 0.12 % MT SOLN
15.0000 mL | Freq: Once | OROMUCOSAL | Status: AC
  Administered 2022-06-14: 15 mL via OROMUCOSAL

## 2022-06-14 MED ORDER — PROPOFOL 500 MG/50ML IV EMUL
INTRAVENOUS | Status: DC | PRN
  Administered 2022-06-14: 75 ug/kg/min via INTRAVENOUS

## 2022-06-14 MED ORDER — BUPIVACAINE LIPOSOME 1.3 % IJ SUSP
INTRAMUSCULAR | Status: DC | PRN
  Administered 2022-06-14: 20 mL

## 2022-06-14 MED ORDER — METHOCARBAMOL 500 MG IVPB - SIMPLE MED: 500.0000 mg | Freq: Four times a day (QID) | INTRAVENOUS | Status: DC | PRN

## 2022-06-14 SURGICAL SUPPLY — 53 items
ATTUNE MED DOME PAT 38 KNEE (Knees) IMPLANT
ATTUNE PS FEM LT SZ 6 CEM KNEE (Femur) IMPLANT
ATTUNE PSRP INSR SZ6 10 KNEE (Insert) IMPLANT
BAG COUNTER SPONGE SURGICOUNT (BAG) IMPLANT
BAG ZIPLOCK 12X15 (MISCELLANEOUS) ×1 IMPLANT
BASE TIBIAL ROT PLAT SZ 7 KNEE (Knees) IMPLANT
BENZOIN TINCTURE PRP APPL 2/3 (GAUZE/BANDAGES/DRESSINGS) ×1 IMPLANT
BLADE SAGITTAL 25.0X1.19X90 (BLADE) ×1 IMPLANT
BLADE SAW SGTL 13.0X1.19X90.0M (BLADE) ×1 IMPLANT
BLADE SURG SZ10 CARB STEEL (BLADE) ×2 IMPLANT
BNDG ELASTIC 6X10 VLCR STRL LF (GAUZE/BANDAGES/DRESSINGS) IMPLANT
BNDG ELASTIC 6X5.8 VLCR STR LF (GAUZE/BANDAGES/DRESSINGS) ×1 IMPLANT
BOOTIES KNEE HIGH SLOAN (MISCELLANEOUS) ×1 IMPLANT
BOWL SMART MIX CTS (DISPOSABLE) ×1 IMPLANT
CEMENT HV SMART SET (Cement) ×2 IMPLANT
CLSR STERI-STRIP ANTIMIC 1/2X4 (GAUZE/BANDAGES/DRESSINGS) IMPLANT
COVER SURGICAL LIGHT HANDLE (MISCELLANEOUS) ×1 IMPLANT
CUFF TOURN SGL QUICK 34 (TOURNIQUET CUFF) ×1
CUFF TRNQT CYL 34X4.125X (TOURNIQUET CUFF) ×1 IMPLANT
DRAPE INCISE IOBAN 66X45 STRL (DRAPES) ×1 IMPLANT
DRAPE U-SHAPE 47X51 STRL (DRAPES) ×1 IMPLANT
DRSG AQUACEL AG ADV 3.5X10 (GAUZE/BANDAGES/DRESSINGS) ×1 IMPLANT
DURAPREP 26ML APPLICATOR (WOUND CARE) ×1 IMPLANT
ELECT REM PT RETURN 15FT ADLT (MISCELLANEOUS) ×1 IMPLANT
GLOVE BIOGEL PI IND STRL 8 (GLOVE) ×2 IMPLANT
GLOVE ECLIPSE 7.5 STRL STRAW (GLOVE) ×2 IMPLANT
GOWN STRL REUS W/ TWL XL LVL3 (GOWN DISPOSABLE) ×2 IMPLANT
GOWN STRL REUS W/TWL XL LVL3 (GOWN DISPOSABLE) ×2
HANDPIECE INTERPULSE COAX TIP (DISPOSABLE) ×1
HOLDER FOLEY CATH W/STRAP (MISCELLANEOUS) IMPLANT
HOOD PEEL AWAY T7 (MISCELLANEOUS) ×3 IMPLANT
KIT TURNOVER KIT A (KITS) IMPLANT
MANIFOLD NEPTUNE II (INSTRUMENTS) ×1 IMPLANT
NEEDLE HYPO 22GX1.5 SAFETY (NEEDLE) ×2 IMPLANT
NS IRRIG 1000ML POUR BTL (IV SOLUTION) ×1 IMPLANT
PACK TOTAL KNEE CUSTOM (KITS) ×1 IMPLANT
PADDING CAST COTTON 6X4 STRL (CAST SUPPLIES) ×1 IMPLANT
PIN STEINMAN FIXATION KNEE (PIN) IMPLANT
PROTECTOR NERVE ULNAR (MISCELLANEOUS) ×1 IMPLANT
SET HNDPC FAN SPRY TIP SCT (DISPOSABLE) ×1 IMPLANT
SPIKE FLUID TRANSFER (MISCELLANEOUS) ×2 IMPLANT
STRIP CLOSURE SKIN 1/2X4 (GAUZE/BANDAGES/DRESSINGS) IMPLANT
SUT MNCRL AB 3-0 PS2 18 (SUTURE) ×1 IMPLANT
SUT VIC AB 0 CT1 36 (SUTURE) ×1 IMPLANT
SUT VIC AB 1 CT1 36 (SUTURE) ×2 IMPLANT
SUT VIC AB 2-0 CT1 27 (SUTURE) ×1
SUT VIC AB 2-0 CT1 TAPERPNT 27 (SUTURE) IMPLANT
SYR CONTROL 10ML LL (SYRINGE) ×2 IMPLANT
TIBIAL BASE ROT PLAT SZ 7 KNEE (Knees) ×1 IMPLANT
TRAY CATH INTERMITTENT SS 16FR (CATHETERS) IMPLANT
TRAY FOLEY MTR SLVR 16FR STAT (SET/KITS/TRAYS/PACK) ×1 IMPLANT
WATER STERILE IRR 1000ML POUR (IV SOLUTION) ×2 IMPLANT
WRAP KNEE MAXI GEL POST OP (GAUZE/BANDAGES/DRESSINGS) ×1 IMPLANT

## 2022-06-14 NOTE — Op Note (Signed)
PATIENT ID:      Joseph Tran  MRN:     [id number] DOB/AGE:    February 20, 1951 / 71 y.o.       OPERATIVE REPORT   DATE OF PROCEDURE:  06/14/2022      PREOPERATIVE DIAGNOSIS:   LEFT KNEE OSTEOARTHRITIS      Estimated body mass index is 34.18 kg/m as calculated from the following:   Height as of this encounter: 6' (1.829 m).   Weight as of this encounter: 114.3 kg.                                                       POSTOPERATIVE DIAGNOSIS:   Same                                                                  PROCEDURE:  Procedure(s): TOTAL KNEE ARTHROPLASTY Using DepuyAttune RP implants #6 Femur, #7Tibia, 10 mm Attune RP bearing, 38 Patella    SURGEON: Alta Corning  ASSISTANT:  Gaspar Skeeters PA-C   (Present and scrubbed throughout the case, critical for assistance with exposure, retraction, instrumentation, and closure.)        ANESTHESIA: spinal, 20cc Exparel, 50cc 0.25% Marcaine EBL: 200 cc FLUID REPLACEMENT: 500 cc crystaloid TOURNIQUET:none DRAINS: None TRANEXAMIC ACID: 1gm IV, 2gm topical COMPLICATIONS:  None         INDICATIONS FOR PROCEDURE: The patient has  LEFT KNEE OSTEOARTHRITIS, mild varus deformities, XR shows bone on bone arthritis, lateral subluxation of tibia. Patient has failed all conservative measures including anti-inflammatory medicines, narcotics, attempts at exercise and weight loss, cortisone injections and viscosupplementation.  Risks and benefits of surgery have been discussed, questions answered.   DESCRIPTION OF PROCEDURE: The patient identified by armband, received  IV antibiotics, in the holding area at [facility]. Patient taken to the operating room, appropriate anesthetic monitors were attached, and spinal anesthesia was  induced. IV Tranexamic acid was given.Tourniquet applied high to the operative thigh. Lateral post and foot positioner applied to the table, the lower extremity was then prepped and draped in usual sterile fashion from the toes  to the tourniquet. Time-out procedure was performed. Zenda Alpers, was present and scrubbed throughout the case, critical for assistance with, positioning, exposure, retraction, instrumentation, and closure.The skin and subcutaneous tissue along the incision was injected with 20 cc of a mixture of Exparel and Marcaine solution, using a 20-gauge by 1-1/2 inch needle. We began the operation, with the knee flexed 130 degrees, by making the anterior midline incision starting at handbreadth above the patella going over the patella 1 cm medial to and 4 cm distal to the tibial tubercle. Small bleeders in the skin and the subcutaneous tissue identified and cauterized. Transverse retinaculum was incised and reflected medially and a medial parapatellar arthrotomy was accomplished. the patella was everted and theprepatellar fat pad resected. The superficial medial collateral ligament was then elevated from anterior to posterior along the proximal flare of the tibia and anterior half of the menisci resected. The knee was hyperflexed exposing bone on bone arthritis. Peripheral and notch osteophytes as well  as the cruciate ligaments were then resected. We continued to work our way around posteriorly along the proximal tibia, and externally rotated the tibia subluxing it out from underneath the femur. A McHale PCL retractor was placed through the notch and a lateral Hohmann retractor placed, and we then entered the proximal tibia in line with the Depuy starter drill in line with the axis of the tibia followed by an intramedullary guide rod and 0-degree posterior slope cutting guide. The tibial cutting guide, 4 degree posterior sloped, was pinned into place allowing resection of 2 mm of bone medially and 10 mm of bone laterally. Satisfied with the tibial resection, we then entered the distal femur 2 mm anterior to the PCL origin with the intramedullary guide rod and applied the distal femoral cutting guide set at 9 mm, with 5  degrees of valgus. This was pinned along the epicondylar axis. At this point, the distal femoral cut was accomplished without difficulty. We then sized for a #6 femoral component and pinned the guide in 3 degrees of external rotation. The chamfer cutting guide was pinned into place. The anterior, posterior, and chamfer cuts were accomplished without difficulty followed by the Attune RP box cutting guide and the box cut. We also removed posterior osteophytes from the posterior femoral condyles. The posterior capsule was injected with Exparel solution. The knee was brought into full extension. We checked our extension gap and fit a 10 mm bearing. Distracting in extension with a lamina spreader,  bleeders in the posterior capsule, Posterior medial and posterior lateral gutter were cauterized.  The transexamic acid-soaked sponge was then placed in the gap of the knee in extension. The knee was flexed 30. The posterior patella cut was accomplished with the 9.5 mm Attune cutting guide, sized for a 2m dome, and the fixation pegs drilled.The knee was then once again hyperflexed exposing the proximal tibia. We sized for a # 7 tibial base plate, applied the smokestack and the conical reamer followed by the the Delta fin keel punch. We then hammered into place the Attune RP trial femoral component, drilled the lugs, inserted a  10 mm trial bearing, trial patellar button, and took the knee through range of motion from 0-130 degrees. Medial and lateral ligamentous stability was checked. No thumb pressure was required for patellar Tracking. The tourniquet was initially inflated but with history of DVT felt that doing surgery with no tourniquet was appropriate. It was let down at 1 minute and not reinflated. All trial components were removed, mating surfaces irrigated with pulse lavage, and dried with suction and sponges. 10 cc of the Exparel solution was applied to the cancellus bone of the patella distal femur and proximal  tibia.  After waiting 30 seconds, the bony surfaces were again, dried with sponges. A double batch of DePuy HV cement was mixed and applied to all bony metallic mating surfaces except for the posterior condyles of the femur itself. In order, we hammered into place the tibial tray and removed excess cement, the femoral component and removed excess cement. The final Attune RP bearing was inserted, and the knee brought to full extension with compression. The patellar button was clamped into place, and excess cement removed. The knee was held at 30 flexion with compression, while the cement cured. The wound was irrigated out with normal saline solution pulse lavage. The rest of the Exparel was injected into the parapatellar arthrotomy, subcutaneous tissues, and periosteal tissues. The parapatellar arthrotomy was closed with running #1 Vicryl suture.  The subcutaneous tissue with 3-0 undyed Vicryl suture, and the skin with running 3-0 SQ vicryl. An Aquacil and Ace wrap were applied. The patient was taken to recovery room without difficulty.   Alta Corning 06/14/2022, 8:56 AM

## 2022-06-14 NOTE — Evaluation (Signed)
Physical Therapy Evaluation Patient Details Name: Joseph Tran MRN: [id number] DOB: 11/10/1950 Today's Date: 06/14/2022  History of Present Illness  Pt is a 71yo male presenting s/p L-TKA on 06/14/22. PMH: L acoustic neuroma, asthma, CKD, hx of DVT, hx of kidney stones, HTN, peripheral neuropathy, OSA on CPAP, R-TKA 2020, SLE.  Clinical Impression  Joseph Tran is a 71 y.o. male POD 0 s/p L-TKA. Patient reports IND with mobility at baseline. Patient is now limited by functional impairments (see PT problem list below) and requires min guard for transfers and gait with RW. Patient was able to ambulate 40 feet with RW and min guard and cues for safe walker management. Patient educated on safe sequencing for stair mobility and verbalized safe guarding position for people assisting with mobility. Patient instructed in exercises to facilitate ROM and circulation. Patient will benefit from continued skilled PT interventions to address impairments and progress towards PLOF. Patient has met mobility goals at adequate level for discharge home; will continue to follow if pt continues acute stay to progress towards Mod I goals.       Recommendations for follow up therapy are one component of a multi-disciplinary discharge planning process, led by the attending physician.  Recommendations may be updated based on patient status, additional functional criteria and insurance authorization.  Follow Up Recommendations Follow physician's recommendations for discharge plan and follow up therapies      Assistance Recommended at Discharge Frequent or constant Supervision/Assistance  Patient can return home with the following  A little help with walking and/or transfers;A little help with bathing/dressing/bathroom;Assistance with cooking/housework;Assist for transportation;Help with stairs or ramp for entrance    Equipment Recommendations None recommended by PT  Recommendations for Other Services        Functional Status Assessment Patient has had a recent decline in their functional status and demonstrates the ability to make significant improvements in function in a reasonable and predictable amount of time.     Precautions / Restrictions Precautions Precautions: Knee Precaution Booklet Issued: No Precaution Comments: no pilllow under the knee Restrictions Weight Bearing Restrictions: No Other Position/Activity Restrictions: wbat      Mobility  Bed Mobility Overal bed mobility: Needs Assistance Bed Mobility: Supine to Sit     Supine to sit: Supervision, HOB elevated     General bed mobility comments: For safety only.    Transfers Overall transfer level: Needs assistance Equipment used: Rolling walker (2 wheels) Transfers: Sit to/from Stand Sit to Stand: Min guard, From elevated surface           General transfer comment: For safety only, VCs for hand placement    Ambulation/Gait Ambulation/Gait assistance: Min guard Gait Distance (Feet): 40 Feet Assistive device: Rolling walker (2 wheels) Gait Pattern/deviations: Step-to pattern, Knees buckling Gait velocity: decreased     General Gait Details: Pt ambulated 57f with RW  and min guard, no physical assist required or overt LOB noted. Very mild L knee buckling that pt able to self-correct with cuing and additional use of BUE support on RW  Stairs            Wheelchair Mobility    Modified Rankin (Stroke Patients Only)       Balance Overall balance assessment: Needs assistance Sitting-balance support: Feet supported, No upper extremity supported Sitting balance-Leahy Scale: Good     Standing balance support: Reliant on assistive device for balance, During functional activity, Bilateral upper extremity supported Standing balance-Leahy Scale: Poor  Pertinent Vitals/Pain Pain Assessment Pain Assessment: 0-10 Pain Score: 4  Pain Location: left  knee Pain Descriptors / Indicators: Operative site guarding Pain Intervention(s): Limited activity within patient's tolerance, Monitored during session, Repositioned    Home Living Family/patient expects to be discharged to:: Private residence Living Arrangements: Alone Available Help at Discharge: Family;Available 24 hours/day Type of Home: House Home Access: Stairs to enter Entrance Stairs-Rails: None Entrance Stairs-Number of Steps: 1   Home Layout: One level Home Equipment: Conservation officer, nature (2 wheels);Shower seat;Toilet riser      Prior Function Prior Level of Function : Independent/Modified Independent;Driving;Working/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, international aid patient)             Mobility Comments: IND ADLs Comments: ind     Hand Dominance   Dominant Hand: Right    Extremity/Trunk Assessment   Upper Extremity Assessment Upper Extremity Assessment: Overall WFL for tasks assessed    Lower Extremity Assessment Lower Extremity Assessment: RLE deficits/detail;LLE deficits/detail RLE Deficits / Details: MMT ank DF/PF 5/5 RLE Sensation: WNL LLE Deficits / Details: MMT ank DF/PF 5/5, no extensor lag noted LLE Sensation: WNL    Cervical / Trunk Assessment Cervical / Trunk Assessment: Normal  Communication   Communication: No difficulties  Cognition Arousal/Alertness: Awake/alert Behavior During Therapy: WFL for tasks assessed/performed Overall Cognitive Status: Within Functional Limits for tasks assessed                                          General Comments General comments (skin integrity, edema, etc.): Sister and BIL present    Exercises Total Joint Exercises Ankle Circles/Pumps: AROM, Both, 10 reps Quad Sets: AROM, Left, Other reps (comment) (2) Short Arc Quad: AROM, Left, Other reps (comment) (2) Heel Slides: AROM, Other reps (comment), Left (2) Hip ABduction/ADduction: AROM, Other reps (comment), Left (2) Straight Leg Raises:  AROM, Right, Other reps (comment) (2) Goniometric ROM: -5-90deg by gross visual approximation   Assessment/Plan    PT Assessment Patient needs continued PT services  PT Problem List Decreased strength;Decreased range of motion;Decreased activity tolerance;Decreased balance;Decreased mobility;Decreased coordination;Pain       PT Treatment Interventions DME instruction;Gait training;Stair training;Functional mobility training;Therapeutic activities;Therapeutic exercise;Balance training;Neuromuscular re-education;Patient/family education    PT Goals (Current goals can be found in the Care Plan section)  Acute Rehab PT Goals Patient Stated Goal: to go home PT Goal Formulation: With patient Time For Goal Achievement: 06/21/22 Potential to Achieve Goals: Good    Frequency 7X/week     Co-evaluation               AM-PAC PT "6 Clicks" Mobility  Outcome Measure Help needed turning from your back to your side while in a flat bed without using bedrails?: None Help needed moving from lying on your back to sitting on the side of a flat bed without using bedrails?: A Little Help needed moving to and from a bed to a chair (including a wheelchair)?: A Little Help needed standing up from a chair using your arms (e.g., wheelchair or bedside chair)?: A Little Help needed to walk in hospital room?: A Little Help needed climbing 3-5 steps with a railing? : A Little 6 Click Score: 19    End of Session Equipment Utilized During Treatment: Gait belt Activity Tolerance: Patient tolerated treatment well;No increased pain Patient left: in bed;with call bell/phone within reach;with family/visitor present (stretcher in PACU)  Nurse Communication: Mobility status PT Visit Diagnosis: Pain;Difficulty in walking, not elsewhere classified (R26.2) Pain - Right/Left: Left Pain - part of body: Knee    Time: 1209-1248 PT Time Calculation (min) (ACUTE ONLY): 39 min   Charges:   PT Evaluation $PT Eval  Low Complexity: 1 Low PT Treatments $Gait Training: 8-22 mins $Therapeutic Exercise: 8-22 mins        Coolidge Breeze, PT, DPT [facility] Department Office: [phone] Weekend pager: [phone]  Coolidge Breeze 06/14/2022, 12:52 PM

## 2022-06-14 NOTE — Anesthesia Procedure Notes (Signed)
Anesthesia Regional Block: Adductor canal block   Pre-Anesthetic Checklist: , timeout performed,  Correct Patient, Correct Site, Correct Laterality,  Correct Procedure, Correct Position, site marked,  Risks and benefits discussed,  Surgical consent,  Pre-op evaluation,  At surgeon's request and post-op pain management  Laterality: Left and Lower  Prep: chloraprep       Needles:  Injection technique: Single-shot  Needle Type: Echogenic Needle     Needle Length: 9cm  Needle Gauge: 21     Additional Needles:   Procedures:,,,, ultrasound used (permanent image in chart),,    Narrative:  Start time: 06/14/2022 6:54 AM End time: 06/14/2022 7:00 AM Injection made incrementally with aspirations every 5 mL.  Performed by: Personally  Anesthesiologist: Annye Asa, MD  Additional Notes: Pt identified in Holding room.  Monitors applied. Working IV access confirmed. Sterile prep L thigh.  #21ga ECHOgenic Arrow block needle into adductor canal with US guidance.  20cc 0.75% Ropivacaine injected incrementally after negative test dose.  Patient asymptomatic, VSS, no heme aspirated, tolerated well.   Jenita Seashore, MD

## 2022-06-14 NOTE — Discharge Instructions (Signed)

## 2022-06-14 NOTE — Anesthesia Procedure Notes (Signed)
Procedure Name: MAC Date/Time: 06/14/2022 7:29 AM  Performed by: Eben Burow, CRNAPre-anesthesia Checklist: Patient identified, Emergency Drugs available, Suction available, Patient being monitored and Timeout performed Oxygen Delivery Method: Simple face mask Placement Confirmation: positive ETCO2

## 2022-06-14 NOTE — Anesthesia Procedure Notes (Addendum)
Spinal  Patient location during procedure: OR Start time: 06/14/2022 7:36 AM Reason for block: surgical anesthesia Staffing Performed: resident/CRNA  Anesthesiologist: Annye Asa, MD Resident/CRNA: Eben Burow, CRNA Performed by: Eben Burow, CRNA Authorized by: Annye Asa, MD   Preanesthetic Checklist Completed: patient identified, IV checked, site marked, risks and benefits discussed, surgical consent, monitors and equipment checked, pre-op evaluation and timeout performed Spinal Block Patient position: sitting Prep: DuraPrep and site prepped and draped Patient monitoring: heart rate, cardiac monitor, continuous pulse ox and blood pressure Approach: midline Location: L3-4 Injection technique: single-shot Needle Needle type: Pencan  Needle gauge: 24 G Needle length: 10 cm Assessment Events: CSF return Additional Notes Pt placed in sitting position, spinal kit expiration date checked and verified, + CSF, - heme, pt tolerated well. Dr Glennon Mac present and supervising throughout placement of SAB. Adequate sensory level.

## 2022-06-14 NOTE — Transfer of Care (Signed)
Immediate Anesthesia Transfer of Care Note  Patient: Joseph Tran  Procedure(s) Performed: TOTAL KNEE ARTHROPLASTY (Left: Knee)  Patient Location: PACU  Anesthesia Type:Spinal  Level of Consciousness: awake, alert , and patient cooperative  Airway & Oxygen Therapy: Patient Spontanous Breathing and Patient connected to face mask oxygen  Post-op Assessment: Report given to RN and Post -op Vital signs reviewed and stable  Post vital signs: Reviewed and stable  Last Vitals:  Vitals Value Taken Time  BP 124/73 06/14/22 0930  Temp    Pulse 74 06/14/22 0933  Resp 15 06/14/22 0933  SpO2 94 % 06/14/22 0933  Vitals shown include unvalidated device data.  Last Pain:  Vitals:   06/14/22 0619  TempSrc: Oral  PainSc:       Patients Stated Pain Goal: 4 (93/24/19 9144)  Complications: No notable events documented.

## 2022-06-14 NOTE — H&P (Signed)
TOTAL KNEE ADMISSION H&P  Patient is being admitted for left total knee arthroplasty.  Subjective:  Chief Complaint:left knee pain.  HPI: Joseph Tran, 71 y.o. male, has a history of pain and functional disability in the left knee due to arthritis and has failed non-surgical conservative treatments for greater than 12 weeks to includeNSAID's and/or analgesics, corticosteriod injections, viscosupplementation injections, flexibility and strengthening excercises, use of assistive devices, and activity modification.  Onset of symptoms was gradual, starting 8 years ago with gradually worsening course since that time. The patient noted prior procedures on the knee to include  arthroscopy on the left knee(s).  Patient currently rates pain in the left knee(s) at 9 out of 10 with activity. Patient has night pain, worsening of pain with activity and weight bearing, pain that interferes with activities of daily living, crepitus, and joint swelling.  Patient has evidence of subchondral cysts, periarticular osteophytes, joint subluxation, and joint space narrowing by imaging studies. This patient has had  failure of all reasonable conservative care . There is no active infection.  Patient Active Problem List   Diagnosis Date Noted   Preoperative respiratory examination 04/26/2022   Morbid obesity ([place]) 03/26/2022   Chondromalacia of knee, left 67/34/1937   Plica of knee, left 90/24/0973   Complex tear of lat mensc, current injury, left knee, init 09/18/2021   Acute meniscal tear, medial, left, initial encounter 09/18/2021   Hyperglycemia 05/30/2019   Primary osteoarthritis of right knee 01/19/2019   CKD (chronic kidney disease), stage III ([place]) 09/13/2017   Renal atrophy, left 09/13/2017   Systemic lupus erythematosus ([place]) 09/13/2017   Dyslipidemia 09/13/2017   Acute meniscal tear, lateral, right, initial encounter 04/25/2017   Osteoarthritis of right knee 04/25/2017   Concussion with loss of  consciousness 08/05/2015   Laceration of spleen 08/05/2015   Lumbar stress fracture 08/05/2015   DVT (deep venous thrombosis) ([place]) 10/18/2014   Hx of bacterial pneumonia 03/10/2013   COLONIC POLYPS, HX OF 04/08/2010   NEPHROLITHIASIS, HX OF 04/08/2010   ACOUSTIC NEUROMA 04/07/2010   Hypogonadism male 04/07/2010   Depression with anxiety 04/07/2010   SLEEP APNEA, OBSTRUCTIVE 04/07/2010   Asthma 04/07/2010   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   ERECTILE DYSFUNCTION, 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 [name] at [facility], left kidney is non=functioning.  right kidny is at 50%   Clotting disorder ([place])    Hx DVT - Xarelto   Colon polyps    Complication of anesthesia    Post op nausea/vomiting   Depression    severe.  dx 1985   DVT (deep venous thrombosis) ([place])    sees Dr. [name]    History of kidney stones    has kidney stone now.     Hypertension    Hypogonadism male    sees Dr. Baruch Gouty at [facility]   Neuropathy of both feet    OSA (obstructive sleep apnea)    Plantar fasciitis    rt foot   Pneumonia    PONV (postoperative nausea and vomiting)    Renal atrophy, left  sees Dr. Baruch [facility]outy at [facility]   Sleep apnea    tested 2010  - wears c-pap    Past Surgical History:  Procedure Laterality Date   CHEST WALL TUMOR EXCISION  2007   COLONOSCOPY  04/07/2020   per Dr. Hilarie Fredrickson, adenomatous polyps, repeat in 3 yrs    kidney caluculs  2004-05   KIDNEY STONE SURGERY     x 2c   KNEE ARTHROSCOPY  09/10/2011   right knee, per Dr. Dorna Leitz    KNEE ARTHROSCOPY Right 04/25/2017   Procedure: RIGHT KNEE ARTHROSCOPY, PARTIAL LATERAL MENISECTOMY,  CHONDROPLASTY MEDIAL AND LATERAL PATELLAOFEMORAL;  Surgeon: Dorna Leitz, MD;  Location: [place];  Service: Orthopedics;  Laterality: Right;   KNEE ARTHROSCOPY Left 09/18/2021   Procedure: ARTHROSCOPY KNEE;  Surgeon: Dorna Leitz, MD;  Location: [facility];  Service: Orthopedics;  Laterality: Left;   KNEE ARTHROSCOPY WITH MEDIAL MENISECTOMY Left 09/18/2021   Procedure: KNEE ARTHROSCOPY WITH MEDIAL MENISECTOMY;  Surgeon: Dorna Leitz, MD;  Location: [facility];  Service: Orthopedics;  Laterality: Left;   madiscus cartilage  2009   MOHS SURGERY     right knee arthroscopy  2008   TONSILLECTOMY     TOTAL KNEE ARTHROPLASTY Right 01/19/2019   Procedure: RIGHT TOTAL KNEE ARTHROPLASTY;  Surgeon: Dorna Leitz, MD;  Location: [facility];  Service: Orthopedics;  Laterality: Right;   UPPER GASTROINTESTINAL ENDOSCOPY      Current Facility-Administered Medications  Medication Dose Route Frequency Provider Last Rate Last Admin   bupivacaine liposome (EXPAREL) 1.3 % injection 266 mg  20 mL Other Once Dorna Leitz, MD       lactated ringers infusion   Intravenous Continuous Lynda Rainwater, MD 10 mL/hr at 06/14/22 4917 Continued from Pre-op at 06/14/22 9150   tranexamic acid (CYKLOKAPRON) IVPB 1,000 mg  1,000 mg Intravenous To OR Dorna Leitz, MD       vancomycin (VANCOCIN) IVPB 1000 mg/200 mL premix  1,000 mg Intravenous On Call to OR Dorna Leitz, MD       Facility-Administered Medications Ordered in Other Encounters  Medication Dose Route Frequency Provider Last Rate Last Admin   fentaNYL (SUBLIMAZE) injection   Intravenous Anesthesia Intra-op Jefm Miles B, CRNA   50 mcg at 06/14/22 0658   midazolam (VERSED) injection   Intravenous Anesthesia Intra-op Jefm Miles B, CRNA   2 mg at 06/14/22 5697   Allergies  Allergen Reactions   Allopurinol Other (See Comments)    PATIENT PREFERENCE Pt refused due to mom developing steven johnson syndrome.   Penicillins Hives    Has patient had a PCN reaction causing  immediate rash, facial/tongue/throat swelling, SOB or lightheadedness with hypotension: Unknown Has patient had a PCN reaction causing severe rash involving mucus membranes or skin necrosis:Unknown Has patient had a PCN reaction that required hospitalization: No Has patient had a PCN reaction occurring within the last 10 years: No If all of the above answers are "NO", then may proceed with Cephalosporin use.    Sulfamethoxazole Hives   Sulfonamide Derivatives Hives   Dilaudid [Hydromorphone Hcl] Nausea And Vomiting    Social History   Tobacco Use   Smoking status: Never   Smokeless tobacco: Never   Tobacco comments:    NEVER USED TOBACCO  Substance Use Topics   Alcohol use: Never    Family History  Problem Relation Age of Onset   Heart disease Other        parents   Hyperlipidemia Other    Stroke  Other        grandparents   Sudden death Other        uncle less than 58 yrs old   Heart disease Father    Throat cancer Maternal Grandmother    Liver cancer Paternal Grandmother    Colon cancer Neg Hx    Esophageal cancer Neg Hx    Rectal cancer Neg Hx    Stomach cancer Neg Hx      Review of Systems ROS: I have reviewed the patient's review of systems thoroughly and there are no positive responses as relates to the HPI.  Objective:  Physical Exam  Vital signs in last 24 hours: Temp:  [98.1 F (36.7 C)] 98.1 F (36.7 C) (12/11 0619) Pulse Rate:  [80] 80 (12/11 0619) Resp:  [10] 10 (12/11 0619) BP: (131)/(85) 131/85 (12/11 0619) SpO2:  [96 %] 96 % (12/11 0619) Weight:  [114.3 kg] 114.3 kg (12/11 0545) Well-developed well-nourished patient in no acute distress. Alert and oriented x3 HEENT:within normal limits Cardiac: Regular rate and rhythm Pulmonary: Lungs clear to auscultation Abdomen: Soft and nontender.  Normal active bowel sounds  Musculoskeletal: (r knee: painful rom limited rom. No instability)  Labs: Recent Results (from the past 2160 hour(s))  Vitamin  B12     Status: None   Collection Time: 03/24/22  9:57 AM  Result Value Ref Range   Vitamin B-12 561 180 - 914 pg/mL    Comment: (NOTE) This assay is not validated for testing neonatal or myeloproliferative syndrome specimens for Vitamin B12 levels. Performed at [facility], [place] [address], [place], [place] [address]   CMP ([place] only)     Status: Abnormal   Collection Time: 03/24/22  9:58 AM  Result Value Ref Range   Sodium 141 135 - 145 mmol/L   Potassium 4.4 3.5 - 5.1 mmol/L   Chloride 108 98 - 111 mmol/L   CO2 27 22 - 32 mmol/L   Glucose, Bld 92 70 - 99 mg/dL    Comment: Glucose reference range applies only to samples taken after fasting for at least 8 hours.   BUN 20 8 - 23 mg/dL   Creatinine 2.01 (H) 0.61 - 1.24 mg/dL   Calcium 9.6 8.9 - 10.3 mg/dL   Total Protein 6.8 6.5 - 8.1 g/dL   Albumin 4.2 3.5 - 5.0 g/dL   AST 15 15 - 41 U/L   ALT 14 0 - 44 U/L   Alkaline Phosphatase 80 38 - 126 U/L   Total Bilirubin 0.6 0.3 - 1.2 mg/dL   GFR, Estimated 35 (L) >60 mL/min    Comment: (NOTE) Calculated using the CKD-EPI Creatinine Equation (2021)    Anion gap 6 5 - 15    Comment: Performed at [facility], [address], [place], [place] [address]  CBC with Differential ([place] Only)     Status: None   Collection Time: 03/24/22  9:58 AM  Result Value Ref Range   WBC Count 5.7 4.0 - 10.5 K/uL   RBC 4.62 4.22 - 5.81 MIL/uL   Hemoglobin 14.5 13.0 - 17.0 g/dL   HCT 43.5 39.0 - 52.0 %   MCV 94.2 80.0 - 100.0 fL   MCH 31.4 26.0 - 34.0 pg   MCHC 33.3 30.0 - 36.0 g/dL   RDW 12.8 11.5 - 15.5 %   Platelet Count 166 150 - 400 K/uL   nRBC 0.0 0.0 - 0.2 %  Neutrophils Relative % 62 %   Neutro Abs 3.5 1.7 - 7.7 K/uL   Lymphocytes Relative 21 %   Lymphs Abs 1.2 0.7 - 4.0 K/uL   Monocytes Relative 7 %   Monocytes Absolute 0.4 0.1 - 1.0 K/uL   Eosinophils Relative 7 %   Eosinophils Absolute 0.4 0.0 - 0.5  K/uL   Basophils Relative 2 %   Basophils Absolute 0.1 0.0 - 0.1 K/uL   Immature Granulocytes 1 %   Abs Immature Granulocytes 0.04 0.00 - 0.07 K/uL    Comment: Performed at [facility], [address] [place], [place] [address]  Surgical pcr screen     Status: None   Collection Time: 06/03/22  1:17 PM   Specimen: Nasal Mucosa; Nasal Swab  Result Value Ref Range   MRSA, PCR NEGATIVE NEGATIVE   Staphylococcus aureus NEGATIVE NEGATIVE    Comment: (NOTE) The Xpert SA Assay (FDA approved for NASAL specimens in patients 7 years of age and older), is one component of a comprehensive surveillance program. It is not intended to diagnose infection nor to guide or monitor treatment. Performed at [facility], [place] [address] [place], [place] [address]   Basic metabolic panel per protocol     Status: Abnormal   Collection Time: 06/03/22  1:17 PM  Result Value Ref Range   Sodium 138 135 - 145 mmol/L   Potassium 4.1 3.5 - 5.1 mmol/L   Chloride 106 98 - 111 mmol/L   CO2 26 22 - 32 mmol/L   Glucose, Bld 86 70 - 99 mg/dL    Comment: Glucose reference range applies only to samples taken after fasting for at least 8 hours.   BUN 22 8 - 23 mg/dL   Creatinine, Ser 1.92 (H) 0.61 - 1.24 mg/dL   Calcium 9.5 8.9 - 10.3 mg/dL   GFR, Estimated 37 (L) >60 mL/min    Comment: (NOTE) Calculated using the CKD-EPI Creatinine Equation (2021)    Anion gap 6 5 - 15    Comment: Performed at [facility], [place] [address], [place], [place] [address]  CBC per protocol     Status: None   Collection Time: 06/03/22  1:17 PM  Result Value Ref Range   WBC 6.1 4.0 - 10.5 K/uL   RBC 4.82 4.22 - 5.81 MIL/uL   Hemoglobin 14.9 13.0 - 17.0 g/dL   HCT 45.3 39.0 - 52.0 %   MCV 94.0 80.0 - 100.0 fL   MCH 30.9 26.0 - 34.0 pg   MCHC 32.9 30.0 - 36.0 g/dL   RDW 12.3 11.5 - 15.5 %   Platelets 210 150 - 400 K/uL   nRBC 0.0 0.0 - 0.2 %     Comment: Performed at [facility], [place] [address], [place], [place] [address]  Glucose, capillary     Status: None   Collection Time: 06/14/22  6:12 AM  Result Value Ref Range   Glucose-Capillary 87 70 - 99 mg/dL    Comment: Glucose reference range applies only to samples taken after fasting for at least 8 hours.   Comment 1 Notify RN    Comment 2 Document in Chart      Estimated body mass index is 34.18 kg/m as calculated from the following:   Height as of this encounter: 6' (1.829 m).   Weight as of this encounter: 114.3 kg.   Imaging Review Plain radiographs demonstrate severe degenerative joint  disease of the left knee(s). The overall alignment ismild varus. The bone quality appears to be fair for age and reported activity level.      Assessment/Plan:  End stage arthritis, left knee   The patient history, physical examination, clinical judgment of the provider and imaging studies are consistent with end stage degenerative joint disease of the left knee(s) and total knee arthroplasty is deemed medically necessary. The treatment options including medical management, injection therapy arthroscopy and arthroplasty were discussed at length. The risks and benefits of total knee arthroplasty were presented and reviewed. The risks due to aseptic loosening, infection, stiffness, patella tracking problems, thromboembolic complications and other imponderables were discussed. The patient acknowledged the explanation, agreed to proceed with the plan and consent was signed. Patient is being admitted for inpatient treatment for surgery, pain control, PT, OT, prophylactic antibiotics, VTE prophylaxis, progressive ambulation and ADL's and discharge planning. The patient is planning to be discharged home with home health services     Patient's anticipated LOS is less than 2 midnights, meeting these requirements: - Younger than 27 - Lives within 1 hour of care - Has a  competent adult at home to recover with post-op recover - NO history of  - Chronic pain requiring opiods  - Diabetes  - Coronary Artery Disease  - Heart failure  - Heart attack  - Stroke  - DVT/VTE  - Cardiac arrhythmia  - Respiratory Failure/COPD  - Renal failure  - Anemia  - Advanced Liver disease

## 2022-06-14 NOTE — Anesthesia Postprocedure Evaluation (Signed)
Anesthesia Post Note  Patient: MARSHEL GOLUBSKI  Procedure(s) Performed: TOTAL KNEE ARTHROPLASTY (Left: Knee)     Patient location during evaluation: PACU Anesthesia Type: Spinal Level of consciousness: awake and alert, patient cooperative and oriented Pain management: pain level controlled Vital Signs Assessment: post-procedure vital signs reviewed and stable Respiratory status: spontaneous breathing, nonlabored ventilation and respiratory function stable Cardiovascular status: blood pressure returned to baseline and stable Postop Assessment: no apparent nausea or vomiting, spinal receding and adequate PO intake Anesthetic complications: no   No notable events documented.  Last Vitals:  Vitals:   06/14/22 1115 06/14/22 1130  BP: 124/79 124/79  Pulse: 71 87  Resp: 16 16  Temp:  36.9 C  SpO2: 94% 93%    Last Pain:  Vitals:   06/14/22 1130  TempSrc:   PainSc: 0-No pain                 Peder Allums,E. Cheryel Kyte

## 2022-06-16 ENCOUNTER — Encounter (HOSPITAL_COMMUNITY): Payer: Self-pay | Admitting: Orthopedic Surgery

## 2022-06-16 DIAGNOSIS — Z86010 Personal history of colonic polyps: Secondary | ICD-10-CM | POA: Diagnosis not present

## 2022-06-16 DIAGNOSIS — N138 Other obstructive and reflux uropathy: Secondary | ICD-10-CM | POA: Diagnosis not present

## 2022-06-16 DIAGNOSIS — J45909 Unspecified asthma, uncomplicated: Secondary | ICD-10-CM | POA: Diagnosis not present

## 2022-06-16 DIAGNOSIS — Z7982 Long term (current) use of aspirin: Secondary | ICD-10-CM | POA: Diagnosis not present

## 2022-06-16 DIAGNOSIS — Z6835 Body mass index (BMI) 35.0-35.9, adult: Secondary | ICD-10-CM | POA: Diagnosis not present

## 2022-06-16 DIAGNOSIS — Z87442 Personal history of urinary calculi: Secondary | ICD-10-CM | POA: Diagnosis not present

## 2022-06-16 DIAGNOSIS — M329 Systemic lupus erythematosus, unspecified: Secondary | ICD-10-CM | POA: Diagnosis not present

## 2022-06-16 DIAGNOSIS — M722 Plantar fascial fibromatosis: Secondary | ICD-10-CM | POA: Diagnosis not present

## 2022-06-16 DIAGNOSIS — Z96652 Presence of left artificial knee joint: Secondary | ICD-10-CM | POA: Diagnosis not present

## 2022-06-16 DIAGNOSIS — K5900 Constipation, unspecified: Secondary | ICD-10-CM | POA: Diagnosis not present

## 2022-06-16 DIAGNOSIS — Z86718 Personal history of other venous thrombosis and embolism: Secondary | ICD-10-CM | POA: Diagnosis not present

## 2022-06-16 DIAGNOSIS — G4733 Obstructive sleep apnea (adult) (pediatric): Secondary | ICD-10-CM | POA: Diagnosis not present

## 2022-06-16 DIAGNOSIS — N401 Enlarged prostate with lower urinary tract symptoms: Secondary | ICD-10-CM | POA: Diagnosis not present

## 2022-06-16 DIAGNOSIS — Z85828 Personal history of other malignant neoplasm of skin: Secondary | ICD-10-CM | POA: Diagnosis not present

## 2022-06-16 DIAGNOSIS — I129 Hypertensive chronic kidney disease with stage 1 through stage 4 chronic kidney disease, or unspecified chronic kidney disease: Secondary | ICD-10-CM | POA: Diagnosis not present

## 2022-06-16 DIAGNOSIS — Z471 Aftercare following joint replacement surgery: Secondary | ICD-10-CM | POA: Diagnosis not present

## 2022-06-16 DIAGNOSIS — N529 Male erectile dysfunction, unspecified: Secondary | ICD-10-CM | POA: Diagnosis not present

## 2022-06-16 DIAGNOSIS — E785 Hyperlipidemia, unspecified: Secondary | ICD-10-CM | POA: Diagnosis not present

## 2022-06-16 DIAGNOSIS — Z96651 Presence of right artificial knee joint: Secondary | ICD-10-CM | POA: Diagnosis not present

## 2022-06-16 DIAGNOSIS — N183 Chronic kidney disease, stage 3 unspecified: Secondary | ICD-10-CM | POA: Diagnosis not present

## 2022-06-16 DIAGNOSIS — E291 Testicular hypofunction: Secondary | ICD-10-CM | POA: Diagnosis not present

## 2022-06-16 DIAGNOSIS — F418 Other specified anxiety disorders: Secondary | ICD-10-CM | POA: Diagnosis not present

## 2022-06-16 DIAGNOSIS — Z8701 Personal history of pneumonia (recurrent): Secondary | ICD-10-CM | POA: Diagnosis not present

## 2022-06-16 DIAGNOSIS — Z7901 Long term (current) use of anticoagulants: Secondary | ICD-10-CM | POA: Diagnosis not present

## 2022-06-17 NOTE — Therapy (Signed)
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 [name] at [facility], left kidney is non=functioning.  right kidny is at 50%   Clotting disorder ([place])    Hx DVT - Xarelto   Colon polyps    Complication of anesthesia    Post op nausea/vomiting   Depression    severe.  dx 1985   DVT (deep venous thrombosis) ([place])    sees Dr. [name]    History of kidney stones    has kidney stone now.     Hypertension    Hypogonadism male    sees Dr. Baruch Gouty at [facility]   Neuropathy of both feet    OSA (obstructive sleep apnea)    Plantar fasciitis    rt foot   Pneumonia    PONV (postoperative nausea and vomiting)    Renal atrophy, left    sees Dr. Baruch Gouty at [facility]   Sleep apnea    tested 2010  - wears c-pap   Past Surgical History:  Procedure Laterality Date   CHEST WALL TUMOR EXCISION  2007   COLONOSCOPY  04/07/2020   per Dr. Hilarie Fredrickson, adenomatous polyps, repeat in 3 yrs    kidney caluculs  2004-05   KIDNEY STONE SURGERY     x 2c   KNEE ARTHROSCOPY  09/10/2011   right knee, per Dr. Dorna Leitz    KNEE ARTHROSCOPY Right 04/25/2017   Procedure: RIGHT KNEE ARTHROSCOPY, PARTIAL LATERAL MENISECTOMY,  CHONDROPLASTY MEDIAL AND LATERAL PATELLAOFEMORAL;  Surgeon: Dorna Leitz, MD;  Location: [place];  Service: Orthopedics;  Laterality: Right;   KNEE ARTHROSCOPY Left 09/18/2021   Procedure: ARTHROSCOPY KNEE;  Surgeon: Dorna Leitz, MD;  Location: [facility];  Service: Orthopedics;  Laterality: Left;   KNEE ARTHROSCOPY WITH MEDIAL MENISECTOMY Left 09/18/2021   Procedure: KNEE ARTHROSCOPY WITH MEDIAL MENISECTOMY;  Surgeon: Dorna Leitz, MD;  Location: [facility];  Service: Orthopedics;  Laterality: Left;   madiscus cartilage  2009   MOHS SURGERY     right knee arthroscopy  2008   TONSILLECTOMY     TOTAL KNEE ARTHROPLASTY Right 01/19/2019   Procedure: RIGHT TOTAL KNEE ARTHROPLASTY;  Surgeon: Dorna Leitz, MD;  Location: [facility];  Service: Orthopedics;  Laterality: Right;   TOTAL KNEE ARTHROPLASTY Left 06/14/2022   Procedure: TOTAL KNEE ARTHROPLASTY;  Surgeon: Dorna Leitz, MD;  Location: [facility];  Service: Orthopedics;  Laterality: Left;   UPPER GASTROINTESTINAL ENDOSCOPY     Patient Active Problem List   Diagnosis  Date Noted   Preoperative respiratory examination 04/26/2022   Morbid obesity ([place]) 03/26/2022   Primary osteoarthritis of left knee 16/01/3709   Plica of knee, left 62/69/4854   Complex tear of lat mensc, current injury, left knee, init 09/18/2021   Acute meniscal tear, medial, left, initial encounter 09/18/2021   Hyperglycemia 05/30/2019   Primary osteoarthritis of right knee 01/19/2019   CKD (chronic kidney disease), stage III ([place]) 09/13/2017   Renal atrophy, left 09/13/2017   Systemic lupus erythematosus ([place]) 09/13/2017   Dyslipidemia 09/13/2017   Acute meniscal tear, lateral, right, initial encounter 04/25/2017   Osteoarthritis of right knee 04/25/2017   Concussion with loss of consciousness 08/05/2015   Laceration of spleen 08/05/2015   Lumbar stress fracture 08/05/2015   DVT (deep venous thrombosis) ([address]) 10/18/2014   Hx of bacterial pneumonia 03/10/2013   COLONIC POLYPS, HX  OF 04/08/2010   NEPHROLITHIASIS, HX OF 04/08/2010   ACOUSTIC NEUROMA 04/07/2010   Hypogonadism male 04/07/2010   Depression with anxiety 04/07/2010   SLEEP APNEA, OBSTRUCTIVE 04/07/2010   Asthma 04/07/2010   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   ERECTILE DYSFUNCTION, 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   PLANTAR FASCIITIS 04/07/2010    PCP: [name], MD   REFERRING PROVIDER: Dorna Leitz, MD   REFERRING DIAG: [phone] (ICD-10-CM) - S/P TKR (total knee replacement)   THERAPY DIAG:  Acute pain of left knee  Stiffness of left knee, not elsewhere classified  Muscle weakness (generalized)  Other abnormalities of gait and mobility  Difficulty in walking, not elsewhere classified  Localized edema  RATIONALE FOR EVALUATION AND TREATMENT: Rehabilitation  ONSET DATE: 06/14/22 - L TKA   NEXT MD VISIT: 06/30/22   SUBJECTIVE:                                                                                                                                                                                                         SUBJECTIVE STATEMENT: Pt reports he completed 6 sessions of [facility], complete Saturday. He weaned from the RW to the [facility] ~1 week ago.  PAIN: Are you having pain? Yes: NPRS scale: 1/10 currently, up to 6-7/10 Pain location: L anterior knee Pain description: sharp, sometimes mild, sometimes excruciating Aggravating factors: bending or straightening knee too quickly, twisting knee Relieving factors: sitting with leg propped up, ice  PERTINENT HISTORY:  R TKA 01/19/19; L knee arthroscopy with medial menisectomy 09/2021; Lupus; L acoustic neuroma; asthma; skin cancer; morbid obesity; CKD-III; DVT 2016; anxiety; depression  PRECAUTIONS: None  WEIGHT BEARING RESTRICTIONS: No  FALLS:  Has patient fallen  in last 6 months? 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 Wears compression stocking 24 hrs most days at MD recommendation.  MUSCLE LENGTH: Hamstrings: mod tight L>R ITB: WFL Piriformis: NT Hip flexors: mild tight B Quads: mild/mod tight L Heelcord: NT  POSTURE:  weight shift right  PALPATION: Good lateral L patellar mobility  LOWER EXTREMITY ROM:  Active ROM Right eval Left eval  Knee flexion 130 108  Knee extension 0 17 seated LAQ 5 supine   Passive ROM Right eval Left eval  Knee flexion 131 111  Knee extension 0 3  (Blank rows = not tested)  LOWER EXTREMITY MMT:  MMT Right eval Left eval  Hip flexion 4+ 4  Hip extension 4 4  Hip abduction 4- 4-  Hip adduction 4 4-  Hip internal rotation    Hip external rotation    Knee flexion 5 4  Knee extension 5 4- *  Ankle dorsiflexion 5 4+  Ankle plantarflexion    Ankle inversion    Ankle eversion     (Blank rows = not tested)  GAIT: Distance walked: 60 ft Assistive device utilized: Single point cane Level of assistance: Modified independence and SBA Gait pattern: step through pattern, decreased stance time- Left, decreased stride length, decreased hip/knee flexion- Left, and antalgic Comments: decreased gait speed   TODAY'S TREATMENT:   06/29/22 Initial eval  THERAPEUTIC EXERCISE: to improve flexibility, strength and mobility.  Verbal and tactile  cues throughout for technique. L hooklying HS stretch with strap x 30" L mod thomas quad/hip/flexor stretch x 30" Standing hip ABD x 10 bil Standing hip extension x 5 bil Standing hip flexion SLR x 5 on L , x 2 on R (d/t L knee wanting to buckle in SLS) Standing hip flexion march x 5 on L , x 3 on R (d/t L knee wanting to buckle in SLS)   PATIENT EDUCATION:  Education details: PT eval findings, anticipated POC, and initial HEP Person educated: Patient Education method: Explanation, Demonstration, and Handouts Education comprehension: verbalized understanding, returned demonstration, and needs further education  HOME EXERCISE PROGRAM: Access Code: [email or web address] URL: [email or web address] Date: 06/29/2022 Prepared by: Annie Paras  Exercises - Hooklying Hamstring Stretch with Strap  - 2-3 x daily - 7 x weekly - 3 reps - 30 sec hold - Supine Quadriceps Stretch with Strap on Table  - 2-3 x daily - 7 x weekly - 3 reps - 30 sec hold - Standing Hip Abduction with Counter Support  - 1 x daily - 7 x weekly - 2 sets - 10 reps - 2-3 sec hold - Standing Hip Extension with Counter Support  - 1 x daily - 7 x weekly - 2 sets - 10 reps - 2-3 sec hold - Standing Hip Flexion with  Counter Support  - 1 x daily - 7 x weekly - 2 sets - 10 reps - 2-3 sec hold - Standing March with Counter Support  - 1 x daily - 7 x weekly - 2 sets - 10 reps - 2-3 sec hold   ASSESSMENT:  CLINICAL IMPRESSION: MOSHE WENGER is a 71 y.o. male  who was seen today for physical therapy evaluation and treatment s/p L TKA on 06/14/2022.  He completed 6 visits of [facility] PT with home health episode closed as of Saturday 06/26/22.  He has been ambulating with an SPC for ~1 week but still demonstrates a mildly antalgic gait pattern with decreased left hip and knee flexion.  Pain mostly localized to sharp pain at anterior patella, but also notes throbbing and proximal quads at the end of the day.  Deficits include left knee pain,  decreased left knee A/PROM, decreased proximal lower extremity flexibility, lower extremity weakness, antalgic gait with decreased left hip and knee flexion, and decreased activity tolerance.  Darden "Ron" will benefit from skilled PT to address above deficits to improve mobility and activity tolerance with decreased pain interference.   OBJECTIVE IMPAIRMENTS: Abnormal gait, decreased activity tolerance, decreased balance, decreased endurance, decreased knowledge of use of DME, decreased mobility, difficulty walking, decreased ROM, decreased strength, hypomobility, increased edema, increased fascial restrictions, impaired perceived functional ability, increased muscle spasms, impaired flexibility, impaired sensation, improper body mechanics, and pain.   ACTIVITY LIMITATIONS: bending, standing, squatting, sleeping, stairs, transfers, bed mobility, and locomotion level  PARTICIPATION LIMITATIONS: meal prep, cleaning, laundry, driving, shopping, community activity, occupation, yard work, and working with his show horses  PERSONAL FACTORS: Past/current experiences, Profession, Time since onset of injury/illness/exacerbation, and 3+ comorbidities: R TKA 01/19/19; L knee arthroscopy with medial menisectomy 09/2021; Lupus; L acoustic neuroma; asthma; skin cancer; morbid obesity; CKD-III; DVT 2016; anxiety; depression  are also affecting patient's functional outcome.   REHAB POTENTIAL: Excellent  CLINICAL DECISION MAKING: Stable/uncomplicated  EVALUATION COMPLEXITY: Low   GOALS: Goals reviewed with patient? Yes  SHORT TERM GOALS: Target date: 07/20/2022   Patient will be independent with initial HEP. Baseline:  Goal status: INITIAL  2.  Patient will demonstrate improved L knee AROM to >/= 2-115 deg to promote normal gait and stair mechanics. Baseline: L knee AROM 5-108 Goal status: INITIAL 20 LONG TERM GOALS: Target date: 08/10/2022    Patient will be independent with advanced/ongoing HEP to  improve outcomes and carryover.  Baseline:  Goal status: INITIAL  2.  Patient will report at least 75% improvement in L knee pain to improve QOL. Baseline:  Goal status: INITIAL  3.  Patient will demonstrate improved L knee AROM to >/= 0-120 deg to allow for normal gait and stair mechanics. Baseline: L knee AROM 5-108 Goal status: INITIAL  4.  Patient will demonstrate improved B LE strength to >/= 4+/5 for improved stability and ease of mobility. Baseline: Refer to MMT table above Goal status: INITIAL  5.  Patient will be able to ambulate 600' with or w/o LRAD and normal gait pattern without increased pain to access community.  Baseline:  Goal status: INITIAL  6. Patient will be able to ascend/descend stairs with 1 HR and reciprocal step pattern safely to access home and community.  Baseline:  Goal status: INITIAL  7.  Patient will report >/= 48/80 on LEFS to demonstrate improved functional ability. Baseline: 39 / 80 = 48.8 % Goal status: INITIAL  8.  Patient will be able to resume working  with his show horses without limitation due to left knee pain or instability. Baseline:  Goal status: INITIAL    PLAN:  PT FREQUENCY: 2x/week  PT DURATION: 4-6 weeks  PLANNED INTERVENTIONS: Therapeutic exercises, Therapeutic activity, Neuromuscular re-education, Balance training, Gait training, Patient/Family education, Self Care, Joint mobilization, Stair training, DME instructions, Dry Needling, Electrical stimulation, Cryotherapy, Moist heat, scar mobilization, Taping, Vasopneumatic device, Ultrasound, Ionotophoresis '4mg'$ /ml Dexamethasone, Manual therapy, and Re-evaluation  PLAN FOR NEXT SESSION: Review initial HEP; gait training with SPC to normalize gait pattern; left knee ROM; LE strengthening working towards increased weightbearing tolerance on left LE   Percival Spanish, PT 06/29/2022, 4:21 PM

## 2022-06-21 DIAGNOSIS — J45909 Unspecified asthma, uncomplicated: Secondary | ICD-10-CM | POA: Diagnosis not present

## 2022-06-21 DIAGNOSIS — N183 Chronic kidney disease, stage 3 unspecified: Secondary | ICD-10-CM | POA: Diagnosis not present

## 2022-06-21 DIAGNOSIS — I129 Hypertensive chronic kidney disease with stage 1 through stage 4 chronic kidney disease, or unspecified chronic kidney disease: Secondary | ICD-10-CM | POA: Diagnosis not present

## 2022-06-21 DIAGNOSIS — Z471 Aftercare following joint replacement surgery: Secondary | ICD-10-CM | POA: Diagnosis not present

## 2022-06-21 DIAGNOSIS — Z96652 Presence of left artificial knee joint: Secondary | ICD-10-CM | POA: Diagnosis not present

## 2022-06-21 DIAGNOSIS — G4733 Obstructive sleep apnea (adult) (pediatric): Secondary | ICD-10-CM | POA: Diagnosis not present

## 2022-06-23 DIAGNOSIS — G4733 Obstructive sleep apnea (adult) (pediatric): Secondary | ICD-10-CM | POA: Diagnosis not present

## 2022-06-23 DIAGNOSIS — J45909 Unspecified asthma, uncomplicated: Secondary | ICD-10-CM | POA: Diagnosis not present

## 2022-06-23 DIAGNOSIS — I129 Hypertensive chronic kidney disease with stage 1 through stage 4 chronic kidney disease, or unspecified chronic kidney disease: Secondary | ICD-10-CM | POA: Diagnosis not present

## 2022-06-23 DIAGNOSIS — Z96652 Presence of left artificial knee joint: Secondary | ICD-10-CM | POA: Diagnosis not present

## 2022-06-23 DIAGNOSIS — Z471 Aftercare following joint replacement surgery: Secondary | ICD-10-CM | POA: Diagnosis not present

## 2022-06-23 DIAGNOSIS — N183 Chronic kidney disease, stage 3 unspecified: Secondary | ICD-10-CM | POA: Diagnosis not present

## 2022-06-25 DIAGNOSIS — N183 Chronic kidney disease, stage 3 unspecified: Secondary | ICD-10-CM | POA: Diagnosis not present

## 2022-06-25 DIAGNOSIS — J45909 Unspecified asthma, uncomplicated: Secondary | ICD-10-CM | POA: Diagnosis not present

## 2022-06-25 DIAGNOSIS — Z471 Aftercare following joint replacement surgery: Secondary | ICD-10-CM | POA: Diagnosis not present

## 2022-06-25 DIAGNOSIS — G4733 Obstructive sleep apnea (adult) (pediatric): Secondary | ICD-10-CM | POA: Diagnosis not present

## 2022-06-25 DIAGNOSIS — I129 Hypertensive chronic kidney disease with stage 1 through stage 4 chronic kidney disease, or unspecified chronic kidney disease: Secondary | ICD-10-CM | POA: Diagnosis not present

## 2022-06-25 DIAGNOSIS — Z96652 Presence of left artificial knee joint: Secondary | ICD-10-CM | POA: Diagnosis not present

## 2022-06-29 ENCOUNTER — Encounter: Payer: Self-pay | Admitting: Physical Therapy

## 2022-06-29 ENCOUNTER — Ambulatory Visit: Payer: Medicare Other | Attending: Orthopedic Surgery | Admitting: Physical Therapy

## 2022-06-29 ENCOUNTER — Other Ambulatory Visit: Payer: Self-pay

## 2022-06-29 DIAGNOSIS — M6281 Muscle weakness (generalized): Secondary | ICD-10-CM | POA: Insufficient documentation

## 2022-06-29 DIAGNOSIS — R600 Localized edema: Secondary | ICD-10-CM | POA: Insufficient documentation

## 2022-06-29 DIAGNOSIS — R262 Difficulty in walking, not elsewhere classified: Secondary | ICD-10-CM | POA: Diagnosis not present

## 2022-06-29 DIAGNOSIS — M25562 Pain in left knee: Secondary | ICD-10-CM | POA: Insufficient documentation

## 2022-06-29 DIAGNOSIS — R2689 Other abnormalities of gait and mobility: Secondary | ICD-10-CM | POA: Insufficient documentation

## 2022-06-29 DIAGNOSIS — M25662 Stiffness of left knee, not elsewhere classified: Secondary | ICD-10-CM | POA: Insufficient documentation

## 2022-06-30 DIAGNOSIS — M1712 Unilateral primary osteoarthritis, left knee: Secondary | ICD-10-CM | POA: Diagnosis not present

## 2022-07-02 ENCOUNTER — Encounter: Payer: Self-pay | Admitting: Physical Therapy

## 2022-07-02 ENCOUNTER — Ambulatory Visit: Payer: Medicare Other | Admitting: Physical Therapy

## 2022-07-02 DIAGNOSIS — M6281 Muscle weakness (generalized): Secondary | ICD-10-CM | POA: Diagnosis not present

## 2022-07-02 DIAGNOSIS — M25562 Pain in left knee: Secondary | ICD-10-CM

## 2022-07-02 DIAGNOSIS — M25662 Stiffness of left knee, not elsewhere classified: Secondary | ICD-10-CM | POA: Diagnosis not present

## 2022-07-02 DIAGNOSIS — R262 Difficulty in walking, not elsewhere classified: Secondary | ICD-10-CM

## 2022-07-02 DIAGNOSIS — R600 Localized edema: Secondary | ICD-10-CM | POA: Diagnosis not present

## 2022-07-02 DIAGNOSIS — R2689 Other abnormalities of gait and mobility: Secondary | ICD-10-CM

## 2022-07-02 NOTE — Therapy (Signed)
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 [name] at [facility], left kidney is non=functioning.  right kidny is at 50%   Clotting disorder ([place])    Hx DVT - Xarelto   Colon polyps    Complication of anesthesia    Post op nausea/vomiting   Depression    severe.  dx 1985   DVT (deep venous thrombosis) ([place])    sees Dr. [name]    History of kidney stones    has kidney stone now.     Hypertension    Hypogonadism male    sees Dr. Baruch Gouty at [facility]   Neuropathy of both feet    OSA (obstructive sleep apnea)    Plantar fasciitis    rt foot   Pneumonia    PONV (postoperative nausea and vomiting)    Renal atrophy, left    sees Dr. Baruch Gouty at [facility]   Sleep apnea    tested 2010  - wears c-pap   Past Surgical History:  Procedure Laterality Date   CHEST WALL TUMOR EXCISION  2007   COLONOSCOPY  04/07/2020   per Dr. Hilarie Fredrickson, adenomatous polyps, repeat in 3 yrs    kidney caluculs  2004-05   KIDNEY STONE SURGERY     x 2c   KNEE ARTHROSCOPY  09/10/2011   right knee, per Dr. Dorna Leitz    KNEE ARTHROSCOPY Right 04/25/2017   Procedure: RIGHT KNEE ARTHROSCOPY, PARTIAL LATERAL MENISECTOMY, CHONDROPLASTY MEDIAL  AND LATERAL PATELLAOFEMORAL;  Surgeon: Dorna Leitz, MD;  Location: [place];  Service: Orthopedics;  Laterality: Right;   KNEE ARTHROSCOPY Left 09/18/2021   Procedure: ARTHROSCOPY KNEE;  Surgeon: Dorna Leitz, MD;  Location: [facility];  Service: Orthopedics;  Laterality: Left;   KNEE ARTHROSCOPY WITH MEDIAL MENISECTOMY Left 09/18/2021   Procedure: KNEE ARTHROSCOPY WITH MEDIAL MENISECTOMY;  Surgeon: Dorna Leitz, MD;  Location: [facility];  Service: Orthopedics;  Laterality: Left;   madiscus cartilage  2009   MOHS SURGERY     right knee arthroscopy  2008   TONSILLECTOMY     TOTAL KNEE ARTHROPLASTY Right 01/19/2019   Procedure: RIGHT TOTAL KNEE ARTHROPLASTY;  Surgeon: Dorna Leitz, MD;  Location: [facility];  Service: Orthopedics;  Laterality: Right;   TOTAL KNEE ARTHROPLASTY Left 06/14/2022   Procedure: TOTAL KNEE ARTHROPLASTY;  Surgeon: Dorna Leitz, MD;  Location: [facility];  Service: Orthopedics;  Laterality: Left;   UPPER GASTROINTESTINAL ENDOSCOPY     Patient Active Problem List   Diagnosis Date Noted  Preoperative respiratory examination 04/26/2022   Morbid obesity ([place]) 03/26/2022   Primary osteoarthritis of left knee 12/87/8676   [place] of knee, left 72/03/4708   Complex tear of lat mensc, current injury, left knee, init 09/18/2021   Acute meniscal tear, medial, left, initial encounter 09/18/2021   Hyperglycemia 05/30/2019   Primary osteoarthritis of right knee 01/19/2019   CKD (chronic kidney disease), stage III ([place]) 09/13/2017   Renal atrophy, left 09/13/2017   Systemic lupus erythematosus ([place]) 09/13/2017   Dyslipidemia 09/13/2017   Acute meniscal tear, lateral, right, initial encounter 04/25/2017   Osteoarthritis of right knee 04/25/2017   Concussion with loss of consciousness 08/05/2015   Laceration of spleen 08/05/2015   Lumbar stress fracture 08/05/2015   DVT (deep venous thrombosis) ([place]) 10/18/2014   Hx of bacterial pneumonia 03/10/2013   COLONIC POLYPS, HX OF 04/08/2010    NEPHROLITHIASIS, HX OF 04/08/2010   ACOUSTIC NEUROMA 04/07/2010   Hypogonadism male 04/07/2010   Depression with anxiety 04/07/2010   SLEEP APNEA, OBSTRUCTIVE 04/07/2010   Asthma 04/07/2010   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   ERECTILE DYSFUNCTION, 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   PLANTAR FASCIITIS 04/07/2010    PCP: [name], MD   REFERRING PROVIDER: Dorna Leitz, MD   REFERRING DIAG: [phone] (ICD-10-CM) - S/P TKR (total knee replacement)   THERAPY DIAG:  Acute pain of left knee  Stiffness of left knee, not elsewhere classified  Muscle weakness (generalized)  Other abnormalities of gait and mobility  Difficulty in walking, not elsewhere classified  Localized edema  RATIONALE FOR EVALUATION AND TREATMENT: Rehabilitation  ONSET DATE: 06/14/22 - L TKA   NEXT MD VISIT: ?   SUBJECTIVE:                                                                                                                                                                                                         SUBJECTIVE STATEMENT: Pt reports he is stiff and sore this morning. Didn't sleep well last night due to pain. He is trying not to take too much pain meds as they make him constipated.  PAIN: Are you having pain? Yes: NPRS scale: 2-5/10 Pain location: L anterior knee Pain description: sharp, sometimes mild, sometimes excruciating Aggravating factors: bending or straightening knee too quickly, twisting knee Relieving factors: sitting with leg propped up, ice  PERTINENT HISTORY:  R TKA 01/19/19; L knee arthroscopy with medial menisectomy 09/2021; Lupus; L acoustic neuroma; asthma; skin cancer; morbid obesity; CKD-III; DVT 2016; anxiety; depression  PRECAUTIONS: None  WEIGHT BEARING RESTRICTIONS: No  FALLS:  Has  patient fallen in last 6 months? 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 Wears compression stocking 24 hrs most days at MD recommendation.  MUSCLE LENGTH: Hamstrings: mod tight L>R ITB: WFL Piriformis: NT Hip flexors: mild tight B Quads: mild/mod tight L Heelcord: NT  POSTURE:  weight shift right  PALPATION: Good lateral L patellar mobility  LOWER EXTREMITY ROM:  Active ROM Right eval Left eval  Knee flexion 130 108  Knee extension 0 17 seated LAQ 5 supine   Passive ROM Right eval Left eval  Knee flexion 131 111  Knee extension 0 3  (Blank rows = not tested)  LOWER EXTREMITY MMT:  MMT Right eval Left eval  Hip flexion 4+ 4  Hip extension 4 4  Hip abduction 4- 4-  Hip adduction 4 4-  Hip internal rotation    Hip external rotation    Knee flexion 5 4  Knee extension 5 4- *  Ankle dorsiflexion 5 4+  Ankle plantarflexion    Ankle inversion    Ankle eversion     (Blank rows = not tested)  GAIT: Distance walked: 60 ft Assistive device utilized: Single point cane Level of assistance: Modified independence and SBA Gait pattern: step through pattern, decreased stance time- Left, decreased stride length, decreased hip/knee flexion- Left, and antalgic Comments: decreased gait speed   TODAY'S TREATMENT:   07/02/22 THERAPEUTIC EXERCISE: to improve flexibility, strength and mobility.  Verbal and tactile cues throughout  for technique. Rec bike - L1 x 6 min Supine L quad set + SLR x 10 Supine L SAQ over 8" bolster x 10 Supine B hip ADD ball squeeze isometric + L SAQ over 8" bolster x 10 Supine L TKE into small ball 10 x 5" Supine L AAROM HS curl/knee flexion with heels on peanut ball + strap assist 2 x 10 Supine L quad set + hip extension isometric into peanut ball 10 x 5"  MANUAL THERAPY: To promote normalized muscle tension, improved flexibility, increased ROM, and reduced pain. STM to L lateral/mid/medial quads, ITB and hip adductors - limited tolerance and unable to tolerate foam roller L knee patellar mobs - all directions   06/29/22 Initial eval  THERAPEUTIC EXERCISE: to improve flexibility, strength and mobility.  Verbal and tactile cues throughout for technique. L hooklying HS stretch with strap x 30" L mod thomas quad/hip/flexor stretch x 30" Standing hip ABD x 10 bil Standing hip extension x 5 bil Standing hip flexion SLR x 5 on L , x 2 on R (d/t L knee wanting to buckle in SLS) Standing hip flexion march x 5 on L , x 3 on R (d/t L knee wanting to buckle in SLS)   PATIENT EDUCATION:  Education details: PT eval findings, anticipated POC, and initial HEP  Person educated: Patient Education method: Explanation, Demonstration, and Handouts Education comprehension: verbalized understanding, returned demonstration, and needs further education  HOME EXERCISE PROGRAM: Access Code: [email or web address] URL: [email or web address] Date: 06/29/2022 Prepared by: Annie Paras  Exercises - Hooklying Hamstring Stretch with Strap  - 2-3 x daily - 7 x weekly - 3 reps - 30 sec hold - Supine Quadriceps Stretch with Strap on Table  - 2-3 x daily - 7 x weekly - 3 reps - 30 sec hold - Standing Hip Abduction with Counter Support  - 1 x daily - 7 x weekly - 2 sets - 10 reps - 2-3 sec hold - Standing Hip Extension with Counter Support  - 1 x daily - 7 x weekly - 2 sets - 10 reps - 2-3 sec hold - Standing  Hip Flexion with Counter Support  - 1 x daily - 7 x weekly - 2 sets - 10 reps - 2-3 sec hold - Standing March with Counter Support  - 1 x daily - 7 x weekly - 2 sets - 10 reps - 2-3 sec hold   ASSESSMENT:  CLINICAL IMPRESSION: Shaquon "Ron" reports increased fatigue since surgery and states the HEP exercises wear him out but he denies any concerns or need for review. Pain today seems to be primarily muscular in origin with TTP t/o L quads, hip adductors and ITB - limited tolerance for STM, therefore suggested resuming muscle relaxants as tolerated to help alleviate abnormal muscle tension. Post-op bandaging removed at MD office on 06/30/22 with incision dry and intact although minor scabbing still present, therefore deferred initiating scar mobilization today. Good tolerance for all supine exercises today. Pt declining ice/vaso, stating he will ice hwen he get home.  OBJECTIVE IMPAIRMENTS: Abnormal gait, decreased activity tolerance, decreased balance, decreased endurance, decreased knowledge of use of DME, decreased mobility, difficulty walking, decreased ROM, decreased strength, hypomobility, increased edema, increased fascial restrictions, impaired perceived functional ability, increased muscle spasms, impaired flexibility, impaired sensation, improper body mechanics, and pain.   ACTIVITY LIMITATIONS: bending, standing, squatting, sleeping, stairs, transfers, bed mobility, and locomotion level  PARTICIPATION LIMITATIONS: meal prep, cleaning, laundry, driving, shopping, community activity, occupation, yard work, and working with his show horses  PERSONAL FACTORS: Past/current experiences, Profession, Time since onset of injury/illness/exacerbation, and 3+ comorbidities: R TKA 01/19/19; L knee arthroscopy with medial menisectomy 09/2021; Lupus; L acoustic neuroma; asthma; skin cancer; morbid obesity; CKD-III; DVT 2016; anxiety; depression  are also affecting patient's functional outcome.   REHAB  POTENTIAL: Excellent  CLINICAL DECISION MAKING: Stable/uncomplicated  EVALUATION COMPLEXITY: Low   GOALS: Goals reviewed with patient? Yes  SHORT TERM GOALS: Target date: 07/20/2022   Patient will be independent with initial HEP. Baseline:  Goal status: IN PROGRESS  2.  Patient will demonstrate improved L knee AROM to >/= 2-115 deg to promote normal gait and stair mechanics. Baseline: L knee AROM 5-108 Goal status: IN PROGRESS 20 LONG TERM GOALS: Target date: 08/10/2022    Patient will be independent with advanced/ongoing HEP to improve outcomes and carryover.  Baseline:  Goal status: IN PROGRESS  2.  Patient will report at least 75% improvement in L knee pain to improve QOL. Baseline:  Goal status: IN PROGRESS  3.  Patient will demonstrate improved L knee AROM to >/= 0-120 deg to allow for normal gait and stair mechanics. Baseline: L knee AROM 5-108 Goal status: IN PROGRESS  4.  Patient will demonstrate improved B LE strength to >/= 4+/5 for improved stability and ease of mobility.  Baseline: Refer to MMT table above Goal status: IN PROGRESS  5.  Patient will be able to ambulate 600' with or w/o LRAD and normal gait pattern without increased pain to access community.  Baseline:  Goal status: IN PROGRESS  6. Patient will be able to ascend/descend stairs with 1 HR and reciprocal step pattern safely to access home and community.  Baseline:  Goal status: IN PROGRESS  7.  Patient will report >/= 48/80 on LEFS to demonstrate improved functional ability. Baseline: 39 / 80 = 48.8 % Goal status: IN PROGRESS  8.  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 Baseline:  Goal status: IN PROGRESS    PLAN:  PT FREQUENCY: 2x/week  PT DURATION: 4-6 weeks  PLANNED INTERVENTIONS: Therapeutic exercises, Therapeutic activity, Neuromuscular re-education, Balance training, Gait training, Patient/Family  education, Self Care, Joint mobilization, Stair training, DME instructions, Dry Needling, Electrical stimulation, Cryotherapy, Moist heat, scar mobilization, Taping, Vasopneumatic device, Ultrasound, Ionotophoresis '4mg'$ /ml Dexamethasone, Manual therapy, and Re-evaluation  PLAN FOR NEXT SESSION: Review initial HEP PRN; gait training with SPC to normalize gait pattern; left knee ROM; LE strengthening working towards increased weightbearing tolerance on left LE   Percival Spanish, PT 07/02/2022, 10:06 AM

## 2022-07-05 NOTE — Therapy (Signed)
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 [name] at [facility], left kidney is non=functioning.  right kidny is at 50%   Clotting disorder ([place])    Hx DVT - Xarelto   Colon polyps    Complication of anesthesia    Post op nausea/vomiting   Depression    severe.  dx 1985   DVT (deep venous thrombosis) ([place])    sees Dr. [name]    History of kidney stones    has kidney stone now.     Hypertension    Hypogonadism male    sees Dr. Baruch Gouty at [facility]   Neuropathy of both feet    OSA (obstructive sleep apnea)    Plantar fasciitis    rt foot   Pneumonia    PONV (postoperative nausea and vomiting)    Renal atrophy, left    sees Dr. Baruch Gouty at [facility]   Sleep apnea    tested 2010  - wears c-pap   Past Surgical History:  Procedure Laterality Date   CHEST WALL TUMOR EXCISION  2007   COLONOSCOPY  04/07/2020   per Dr. Hilarie Fredrickson, adenomatous polyps, repeat in 3 yrs    kidney caluculs  2004-05   KIDNEY STONE SURGERY     x 2c   KNEE ARTHROSCOPY  09/10/2011   right knee, per Dr. Dorna Leitz    KNEE ARTHROSCOPY Right 04/25/2017   Procedure: RIGHT KNEE ARTHROSCOPY, PARTIAL LATERAL MENISECTOMY, CHONDROPLASTY MEDIAL  AND LATERAL PATELLAOFEMORAL;  Surgeon: Dorna Leitz, MD;  Location: [place];  Service: Orthopedics;  Laterality: Right;   KNEE ARTHROSCOPY Left 09/18/2021   Procedure: ARTHROSCOPY KNEE;  Surgeon: Dorna Leitz, MD;  Location: [facility];  Service: Orthopedics;  Laterality: Left;   KNEE ARTHROSCOPY WITH MEDIAL MENISECTOMY Left 09/18/2021   Procedure: KNEE ARTHROSCOPY WITH MEDIAL MENISECTOMY;  Surgeon: Dorna Leitz, MD;  Location: [facility];  Service: Orthopedics;  Laterality: Left;   madiscus cartilage  2009   MOHS SURGERY     right knee arthroscopy  2008   TONSILLECTOMY     TOTAL KNEE ARTHROPLASTY Right 01/19/2019   Procedure: RIGHT TOTAL KNEE ARTHROPLASTY;  Surgeon: Dorna Leitz, MD;  Location: [facility];  Service: Orthopedics;  Laterality: Right;   TOTAL KNEE ARTHROPLASTY Left 06/14/2022   Procedure: TOTAL KNEE ARTHROPLASTY;  Surgeon: Dorna Leitz, MD;  Location: [facility];  Service: Orthopedics;  Laterality: Left;   UPPER GASTROINTESTINAL ENDOSCOPY     Patient Active Problem List   Diagnosis Date  Noted   Preoperative respiratory examination 04/26/2022   Morbid obesity ([place]) 03/26/2022   Primary osteoarthritis of left knee 71/12/2692   Plica of knee, left 85/46/2703   Complex tear of lat mensc, current injury, left knee, init 09/18/2021   Acute meniscal tear, medial, left, initial encounter 09/18/2021   Hyperglycemia 05/30/2019   Primary osteoarthritis of right knee 01/19/2019   CKD (chronic kidney disease), stage III ([place]) 09/13/2017   Renal atrophy, left 09/13/2017   Systemic lupus erythematosus ([place]) 09/13/2017   Dyslipidemia 09/13/2017   Acute meniscal tear, lateral, right, initial encounter 04/25/2017   Osteoarthritis of right knee 04/25/2017   Concussion with loss of consciousness 08/05/2015   Laceration of spleen 08/05/2015   Lumbar stress fracture 08/05/2015   DVT (deep venous thrombosis) ([place]) 10/18/2014   Hx of bacterial pneumonia 03/10/2013   COLONIC POLYPS, HX OF 04/08/2010    NEPHROLITHIASIS, HX OF 04/08/2010   ACOUSTIC NEUROMA 04/07/2010   Hypogonadism male 04/07/2010   Depression with anxiety 04/07/2010   SLEEP APNEA, OBSTRUCTIVE 04/07/2010   Asthma 04/07/2010   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   ERECTILE DYSFUNCTION, 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   PLANTAR FASCIITIS 04/07/2010    PCP: [name], MD   REFERRING PROVIDER: Dorna Leitz, MD   REFERRING DIAG: [phone] (ICD-10-CM) - S/P TKR (total knee replacement)   THERAPY DIAG:  Acute pain of left knee  Muscle weakness (generalized)  Stiffness of left knee, not elsewhere classified  Other abnormalities of gait and mobility  Difficulty in walking, not elsewhere classified  Localized edema  RATIONALE FOR EVALUATION AND TREATMENT: Rehabilitation  ONSET DATE: 06/14/22 - L TKA   NEXT MD VISIT: ?   SUBJECTIVE:                                                                                                                                                                                                         SUBJECTIVE STATEMENT: Yesterday was the best day since the surgery, but today I'm having increased pain under the knee cap and intermittently a shooting pain on the inside of knee.  PAIN: Are you having pain? Yes: NPRS scale: 3/10 Pain location: L anterior knee Pain description: sharp, sometimes mild, sometimes excruciating Aggravating factors: bending or straightening knee too quickly, twisting knee Relieving factors: sitting with leg propped up, ice  PERTINENT HISTORY:  R TKA 01/19/19; L knee arthroscopy with medial menisectomy 09/2021; Lupus; L acoustic neuroma; asthma; skin cancer; morbid obesity; CKD-III; DVT 2016; anxiety; depression  PRECAUTIONS: None  WEIGHT BEARING RESTRICTIONS: No  FALLS:  Has patient  fallen in last 6 months? 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 Wears compression stocking 24 hrs most days at MD recommendation.  MUSCLE LENGTH: Hamstrings: mod tight L>R ITB: WFL Piriformis: NT Hip flexors: mild tight B Quads: mild/mod tight L Heelcord: NT  POSTURE:  weight shift right  PALPATION: Good lateral L patellar mobility  LOWER EXTREMITY ROM:  Active ROM Right eval Left eval  Knee flexion 130 108  Knee extension 0 17 seated LAQ 5 supine   Passive ROM Right eval Left eval  Knee flexion 131 111  Knee extension 0 3  (Blank rows = not tested)  LOWER EXTREMITY MMT:  MMT Right eval Left eval  Hip flexion 4+ 4  Hip extension 4 4  Hip abduction 4- 4-  Hip adduction 4 4-  Hip internal rotation    Hip external rotation    Knee flexion 5 4  Knee extension 5 4- *  Ankle dorsiflexion 5 4+  Ankle plantarflexion    Ankle inversion    Ankle eversion     (Blank rows = not tested)  GAIT: Distance walked: 60 ft Assistive device utilized: Single point cane Level of assistance: Modified independence and SBA Gait pattern: step through pattern, decreased stance time- Left, decreased stride length, decreased hip/knee flexion- Left, and antalgic Comments: decreased gait speed   TODAY'S TREATMENT:   07/06/22 THERAPEUTIC EXERCISE: to improve flexibility, strength and mobility.  Verbal and tactile cues throughout for  technique. Rec bike - L1 x 6 min Supine L quad set + SLR x 10 Supine L SAQ over 8" bolster x 10 Supine quad stretch with strap 2x30 sec LLE Supine L AAROM HS curl/knee flexion with heels on peanut ball + strap assist 10x with 5 sec hold Supine L quad set 10 x 5" Standing hip abduction, extension, marches x 10 bil  MANUAL THERAPY: To promote normalized muscle tension, improved flexibility, increased ROM, and reduced pain. IASTM with EDGE tool to left medial knee and along incision, scar massage and education on self scar massage L knee patellar mobs - all directions  07/02/22 THERAPEUTIC EXERCISE: to improve flexibility, strength and mobility.  Verbal and tactile cues throughout for technique. Rec bike - L1 x 6 min Supine L quad set + SLR x 10 Supine L SAQ over 8" bolster x 10 Supine B hip ADD ball squeeze isometric + L SAQ over 8" bolster x 10 Supine L TKE into small ball 10 x 5" Supine L AAROM HS curl/knee flexion with heels on peanut ball + strap assist 2 x 10 Supine L quad set + hip extension isometric into peanut ball 10 x 5"  MANUAL THERAPY: To promote normalized muscle tension, improved flexibility, increased ROM, and reduced pain. STM to L lateral/mid/medial quads, ITB and hip  adductors - limited tolerance and unable to tolerate foam roller L knee patellar mobs - all directions   06/29/22 Initial eval  THERAPEUTIC EXERCISE: to improve flexibility, strength and mobility.  Verbal and tactile cues throughout for technique. L hooklying HS stretch with strap x 30" L mod thomas quad/hip/flexor stretch x 30" Standing hip ABD x 10 bil Standing hip extension x 5 bil Standing hip flexion SLR x 5 on L , x 2 on R (d/t L knee wanting to buckle in SLS) Standing hip flexion march x 5 on L , x 3 on R (d/t L knee wanting to buckle in SLS)   PATIENT EDUCATION:  Education details: PT eval findings, anticipated POC, and initial HEP Person educated: Patient Education method: Explanation,  Demonstration, and Handouts Education comprehension: verbalized understanding, returned demonstration, and needs further education  HOME EXERCISE PROGRAM: Access Code: [email or web address] URL: [email or web address] Date: 06/29/2022 Prepared by: Annie Paras  Exercises - Hooklying Hamstring Stretch with Strap  - 2-3 x daily - 7 x weekly - 3 reps - 30 sec hold - Supine Quadriceps Stretch with Strap on Table  - 2-3 x daily - 7 x weekly - 3 reps - 30 sec hold - Standing Hip Abduction with Counter Support  - 1 x daily - 7 x weekly - 2 sets - 10 reps - 2-3 sec hold - Standing Hip Extension with Counter Support  - 1 x daily - 7 x weekly - 2 sets - 10 reps - 2-3 sec hold - Standing Hip Flexion with Counter Support  - 1 x daily - 7 x weekly - 2 sets - 10 reps - 2-3 sec hold - Standing March with Counter Support  - 1 x daily - 7 x weekly - 2 sets - 10 reps - 2-3 sec hold   ASSESSMENT:  CLINICAL IMPRESSION: Pt responded well to treatment with no increased pain. TE was completed by PTA, while manual was done by PT. Pt showed good capacity for exercises. In standing he had an instance of his L knee giving out with the stance phase of exercises. Pt denied any modalities this session  but advised to use ice at home.  OBJECTIVE IMPAIRMENTS: Abnormal gait, decreased activity tolerance, decreased balance, decreased endurance, decreased knowledge of use of DME, decreased mobility, difficulty walking, decreased ROM, decreased strength, hypomobility, increased edema, increased fascial restrictions, impaired perceived functional ability, increased muscle spasms, impaired flexibility, impaired sensation, improper body mechanics, and pain.   ACTIVITY LIMITATIONS: bending, standing, squatting, sleeping, stairs, transfers, bed mobility, and locomotion level  PARTICIPATION LIMITATIONS: meal prep, cleaning, laundry, driving, shopping, community activity, occupation, yard work, and working with his show  horses  PERSONAL FACTORS: Past/current experiences, Profession, Time since onset of injury/illness/exacerbation, and 3+ comorbidities: R TKA 01/19/19; L knee arthroscopy with medial menisectomy 09/2021; Lupus; L acoustic neuroma; asthma; skin cancer; morbid obesity; CKD-III; DVT 2016; anxiety; depression  are also affecting patient's functional outcome.   REHAB POTENTIAL: Excellent  CLINICAL DECISION MAKING: Stable/uncomplicated  EVALUATION COMPLEXITY: Low   GOALS: Goals reviewed with patient? Yes  SHORT TERM GOALS: Target date: 07/20/2022   Patient will be independent with initial HEP. Baseline:  Goal status: IN PROGRESS  2.  Patient will demonstrate improved L knee AROM to >/= 2-115 deg to promote normal gait and stair mechanics. Baseline: L knee AROM 5-108 Goal status: IN PROGRESS 20 LONG TERM GOALS: Target date: 08/10/2022    Patient will be independent with advanced/ongoing HEP to improve outcomes and carryover.  Baseline:  Goal status: IN PROGRESS  2.  Patient will report at least 75% improvement in L knee pain to improve QOL. Baseline:  Goal status: IN PROGRESS  3.  Patient will demonstrate improved L knee AROM to >/= 0-120 deg to allow for normal gait and stair mechanics. Baseline: L knee AROM 5-108 Goal status: IN PROGRESS  4.  Patient will demonstrate improved B LE strength to >/= 4+/5 for improved stability and ease of mobility. Baseline: Refer to MMT table above Goal status: IN PROGRESS  5.  Patient will be able to ambulate 600' with or w/o LRAD and normal gait pattern without increased pain to access community.  Baseline:  Goal status: IN PROGRESS  6. Patient will be able to ascend/descend stairs with 1 HR and reciprocal step pattern safely to access home and community.  Baseline:  Goal status: IN PROGRESS  7.  Patient will report >/= 48/80 on LEFS to demonstrate improved functional ability. Baseline: 39 / 80 = 48.8 % Goal status: IN PROGRESS  8.   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 Baseline:  Goal status: IN PROGRESS    PLAN:  PT FREQUENCY: 2x/week  PT DURATION: 4-6 weeks  PLANNED INTERVENTIONS: Therapeutic exercises, Therapeutic activity, Neuromuscular re-education, Balance training, Gait training, Patient/Family education, Self Care, Joint mobilization, Stair training, DME instructions, Dry Needling, Electrical stimulation, Cryotherapy, Moist heat, scar mobilization, Taping, Vasopneumatic device, Ultrasound, Ionotophoresis '4mg'$ /ml Dexamethasone, Manual therapy, and Re-evaluation  PLAN FOR NEXT SESSION: Review initial HEP PRN; gait training with SPC to normalize gait pattern; left knee ROM; LE strengthening working towards increased weightbearing tolerance on left LE  [place], [place], PTA 07/06/2022  12:03 PM    Aprill Banko, PT 07/06/2022, 12:03 PM

## 2022-07-06 ENCOUNTER — Encounter: Payer: Self-pay | Admitting: Physical Therapy

## 2022-07-06 ENCOUNTER — Ambulatory Visit: Payer: Medicare Other | Attending: Orthopedic Surgery | Admitting: Physical Therapy

## 2022-07-06 DIAGNOSIS — R600 Localized edema: Secondary | ICD-10-CM | POA: Insufficient documentation

## 2022-07-06 DIAGNOSIS — M25662 Stiffness of left knee, not elsewhere classified: Secondary | ICD-10-CM | POA: Diagnosis not present

## 2022-07-06 DIAGNOSIS — M6281 Muscle weakness (generalized): Secondary | ICD-10-CM | POA: Insufficient documentation

## 2022-07-06 DIAGNOSIS — M25562 Pain in left knee: Secondary | ICD-10-CM | POA: Diagnosis not present

## 2022-07-06 DIAGNOSIS — R2689 Other abnormalities of gait and mobility: Secondary | ICD-10-CM | POA: Insufficient documentation

## 2022-07-06 DIAGNOSIS — R262 Difficulty in walking, not elsewhere classified: Secondary | ICD-10-CM | POA: Diagnosis not present

## 2022-07-09 ENCOUNTER — Ambulatory Visit: Payer: Medicare Other | Admitting: Physical Therapy

## 2022-07-09 ENCOUNTER — Encounter: Payer: Self-pay | Admitting: Physical Therapy

## 2022-07-09 DIAGNOSIS — M25662 Stiffness of left knee, not elsewhere classified: Secondary | ICD-10-CM

## 2022-07-09 DIAGNOSIS — M25562 Pain in left knee: Secondary | ICD-10-CM

## 2022-07-09 DIAGNOSIS — R262 Difficulty in walking, not elsewhere classified: Secondary | ICD-10-CM | POA: Diagnosis not present

## 2022-07-09 DIAGNOSIS — M6281 Muscle weakness (generalized): Secondary | ICD-10-CM

## 2022-07-09 DIAGNOSIS — R600 Localized edema: Secondary | ICD-10-CM

## 2022-07-09 DIAGNOSIS — R2689 Other abnormalities of gait and mobility: Secondary | ICD-10-CM | POA: Diagnosis not present

## 2022-07-09 NOTE — Therapy (Signed)
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 [name] at [facility], left kidney is non=functioning.  right kidny is at 50%   Clotting disorder ([place])    Hx DVT - Xarelto   Colon polyps    Complication of anesthesia    Post op nausea/vomiting   Depression    severe.  dx 1985   DVT (deep venous thrombosis) ([place])    sees Dr. [name]    History of kidney stones    has kidney stone now.     Hypertension    Hypogonadism male    sees Dr. Baruch Gouty at [facility]   Neuropathy of both feet    OSA (obstructive sleep apnea)    Plantar fasciitis    rt foot   Pneumonia    PONV (postoperative nausea and vomiting)    Renal atrophy, left    sees Dr. Baruch Gouty at [facility]   Sleep apnea    tested 2010  - wears c-pap   Past Surgical History:  Procedure Laterality Date   CHEST WALL TUMOR EXCISION  2007   COLONOSCOPY  04/07/2020   per Dr. Hilarie Fredrickson, adenomatous polyps, repeat in 3 yrs    kidney caluculs  2004-05   KIDNEY STONE SURGERY     x 2c   KNEE ARTHROSCOPY  09/10/2011   right knee, per Dr. Dorna Leitz    KNEE ARTHROSCOPY Right 04/25/2017   Procedure: RIGHT KNEE ARTHROSCOPY, PARTIAL LATERAL MENISECTOMY, CHONDROPLASTY MEDIAL  AND LATERAL PATELLAOFEMORAL;  Surgeon: Dorna Leitz, MD;  Location: [place];  Service: Orthopedics;  Laterality: Right;   KNEE ARTHROSCOPY Left 09/18/2021   Procedure: ARTHROSCOPY KNEE;  Surgeon: Dorna Leitz, MD;  Location: [facility];  Service: Orthopedics;  Laterality: Left;   KNEE ARTHROSCOPY WITH MEDIAL MENISECTOMY Left 09/18/2021   Procedure: KNEE ARTHROSCOPY WITH MEDIAL MENISECTOMY;  Surgeon: Dorna Leitz, MD;  Location: [facility];  Service: Orthopedics;  Laterality: Left;   madiscus cartilage  2009   MOHS SURGERY     right knee arthroscopy  2008   TONSILLECTOMY     TOTAL KNEE ARTHROPLASTY Right 01/19/2019   Procedure: RIGHT TOTAL KNEE ARTHROPLASTY;  Surgeon: Dorna Leitz, MD;  Location: [facility];  Service: Orthopedics;  Laterality: Right;   TOTAL KNEE ARTHROPLASTY Left 06/14/2022   Procedure: TOTAL KNEE ARTHROPLASTY;  Surgeon: Dorna Leitz, MD;  Location: [facility];  Service: Orthopedics;  Laterality: Left;   UPPER GASTROINTESTINAL ENDOSCOPY     Patient Active Problem List   Diagnosis Date  Noted   Preoperative respiratory examination 04/26/2022   Morbid obesity ([place]) 03/26/2022   Primary osteoarthritis of left knee 16/04/9603   [place]lica of knee, left 54/03/8118   Complex tear of lat mensc, current injury, left knee, init 09/18/2021   Acute meniscal tear, medial, left, initial encounter 09/18/2021   Hyperglycemia 05/30/2019   Primary osteoarthritis of right knee 01/19/2019   CKD (chronic kidney disease), stage III ([place]) 09/13/2017   Renal atrophy, left 09/13/2017   Systemic lupus erythematosus ([place]) 09/13/2017   Dyslipidemia 09/13/2017   Acute meniscal tear, lateral, right, initial encounter 04/25/2017   Osteoarthritis of right knee 04/25/2017   Concussion with loss of consciousness 08/05/2015   Laceration of spleen 08/05/2015   Lumbar stress fracture 08/05/2015   DVT (deep venous thrombosis) ([place]) 10/18/2014   Hx of bacterial pneumonia 03/10/2013   COLONIC POLYPS, HX OF 04/08/2010    NEPHROLITHIASIS, HX OF 04/08/2010   ACOUSTIC NEUROMA 04/07/2010   Hypogonadism male 04/07/2010   Depression with anxiety 04/07/2010   SLEEP APNEA, OBSTRUCTIVE 04/07/2010   Asthma 04/07/2010   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   ERECTILE DYSFUNCTION, 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   PLANTAR FASCIITIS 04/07/2010    PCP: [name], MD   REFERRING PROVIDER: Dorna Leitz, MD   REFERRING DIAG: [phone] (ICD-10-CM) - S/P TKR (total knee replacement)   THERAPY DIAG:  Acute pain of left knee  Muscle weakness (generalized)  Stiffness of left knee, not elsewhere classified  Other abnormalities of gait and mobility  Difficulty in walking, not elsewhere classified  Localized edema  RATIONALE FOR EVALUATION AND TREATMENT: Rehabilitation  ONSET DATE: 06/14/22 - L TKA   NEXT MD VISIT: ?   SUBJECTIVE:                                                                                                                                                                                                         SUBJECTIVE STATEMENT: Pt reports increased pain following last visit which seemed to resolve the next day. He returned to work as a "standardized patient" yesterday - felt worn out after long walk across campus and climbing 30 stairs, and didn't sleep well last night.  PAIN: Are you having pain? Yes: NPRS scale: 3/10 Pain location: L anterior knee Pain description: sharp, sometimes mild, sometimes excruciating Aggravating factors: bending or straightening knee too quickly, twisting knee Relieving factors: sitting with leg propped up, ice  PERTINENT HISTORY:  R TKA 01/19/19; L knee arthroscopy with medial menisectomy 09/2021; Lupus; L acoustic neuroma; asthma; skin cancer; morbid obesity; CKD-III; DVT 2016; anxiety;  depression  PRECAUTIONS: None  WEIGHT BEARING RESTRICTIONS: No  FALLS:  Has patient fallen in last 6 months? 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 Wears compression stocking 24 hrs most days at MD recommendation.  MUSCLE LENGTH: Hamstrings: mod tight L>R ITB: WFL Piriformis: NT Hip flexors: mild tight B Quads: mild/mod tight L Heelcord: NT  POSTURE:  weight shift right  PALPATION: Good lateral L patellar mobility  LOWER EXTREMITY ROM:  Active ROM Right eval Left eval Left 07/09/22  Knee flexion 130 108 116 seated 125 supine  Knee extension 0 17 seated LAQ 5 supine 9 seated LAQ 2 supine   Passive ROM Right eval Left eval Left 07/09/22  Knee flexion 131 111   Knee extension 0 3   (Blank rows = not tested)  LOWER EXTREMITY MMT:  MMT Right eval Left eval  Hip flexion 4+ 4  Hip extension 4 4  Hip abduction 4- 4-  Hip adduction 4 4-  Hip internal rotation    Hip external rotation    Knee flexion 5 4  Knee extension 5 4- *  Ankle dorsiflexion 5 4+  Ankle plantarflexion    Ankle inversion    Ankle eversion     (Blank rows = not tested)  GAIT: Distance walked: 60 ft Assistive device utilized: Single point cane Level of assistance: Modified independence and SBA Gait pattern: step through pattern, decreased stance time- Left, decreased stride length, decreased hip/knee flexion- Left, and antalgic Comments:  decreased gait speed   TODAY'S TREATMENT:   07/09/22 THERAPEUTIC EXERCISE: to improve flexibility, strength and mobility.  Verbal and tactile cues throughout for technique. Rec bike - L3 x 6 min Standing L knee TKE into ball on wall 10 x 5", 2 sets Seated hip adduction ball squeeze + L LAQ 2 x 10 Seated RTB HS curl 2 x 10 Standing RTB L 4-way SLR x 10  MANUAL THERAPY: To promote improved flexibility, improved joint mobility, increased ROM, and reduced pain.  L knee - patellar mobs all directions L knee incisional scar massage  SELF CARE:  Provided instruction in self-scar mobilization/massage as well as patellar mobs Answered pt's questions regarding working with his horses   07/06/22 THERAPEUTIC EXERCISE: to improve flexibility, strength and mobility.  Verbal and tactile cues throughout for technique. Rec bike - L1 x 6 min Supine L quad set + SLR x 10 Supine L SAQ over 8" bolster x 10 Supine quad stretch with strap 2x30 sec LLE Supine L AAROM HS curl/knee flexion with heels on peanut ball + strap assist 10x with 5 sec hold Supine L quad set 10 x 5" Standing hip abduction, extension, marches x 10 bil  MANUAL THERAPY: To promote normalized muscle tension, improved flexibility, increased ROM, and reduced pain. IASTM with  EDGE tool to left medial knee and along incision, scar massage and education on self scar massage L knee patellar mobs - all directions   07/02/22 THERAPEUTIC EXERCISE: to improve flexibility, strength and mobility.  Verbal and tactile cues throughout for technique. Rec bike - L1 x 6 min Supine L quad set + SLR x 10 Supine L SAQ over 8" bolster x 10 Supine B hip ADD ball squeeze isometric + L SAQ over 8" bolster x 10 Supine L TKE into small ball 10 x 5" Supine L AAROM HS curl/knee flexion with heels on peanut ball + strap assist 2 x 10 Supine L quad set + hip extension isometric into peanut ball 10 x 5"  MANUAL THERAPY: To promote normalized muscle tension,  improved flexibility, increased ROM, and reduced pain. STM to L lateral/mid/medial quads, ITB and hip adductors - limited tolerance and unable to tolerate foam roller L knee patellar mobs - all directions   PATIENT EDUCATION:  Education details: PT eval findings, anticipated POC, and initial HEP Person educated: Patient Education method: Explanation, Demonstration, and Handouts Education comprehension: verbalized understanding, returned demonstration, and needs further education  HOME EXERCISE PROGRAM: Access Code: [email or web address] URL: [email or web address] Date: 07/09/2022 Prepared by: Annie Paras  Exercises - Hooklying Hamstring Stretch with Strap  - 2-3 x daily - 7 x weekly - 3 reps - 30 sec hold - Supine Quadriceps Stretch with Strap on Table  - 2-3 x daily - 7 x weekly - 3 reps - 30 sec hold - Standing Hip Abduction with Counter Support  - 1 x daily - 7 x weekly - 2 sets - 10 reps - 2-3 sec hold - Standing Hip Extension with Counter Support  - 1 x daily - 7 x weekly - 2 sets - 10 reps - 2-3 sec hold - Standing Hip Flexion with Counter Support  - 1 x daily - 7 x weekly - 2 sets - 10 reps - 2-3 sec hold - Standing March with Counter Support  - 1 x daily - 7 x weekly - 2 sets - 10 reps - 2-3 sec hold - Gastroc Stretch on Wall  - 2-3 x daily - 7 x weekly - 3 reps - 30 sec hold   ASSESSMENT:  CLINICAL IMPRESSION: [place] "Ron" reports he has been performing the patella mobs but has not tried the scar massage/mobilization, therefore reviewed this today. Progressed strengthening, increasing standing exercises intermixed with seated exercises to allow for breaks from standing as pt limited by fatigue. L knee ROM progressing well with AROM now 2-125 with 9 extension lacking in LAQ. He notes sleep disturbance due to difficulty tolerating knee extension in bed secondary to pain/pressure at back of knee - reviewed HS stretch and added gastroc stretch to reduce muscle tension in back  of knee. Pt declining ice/vaso but will ice when he get home.  OBJECTIVE IMPAIRMENTS: Abnormal gait, decreased activity tolerance, decreased balance, decreased endurance, decreased knowledge of use of DME, decreased mobility, difficulty walking, decreased ROM, decreased strength, hypomobility, increased edema, increased fascial restrictions, impaired perceived functional ability, increased muscle spasms, impaired flexibility, impaired sensation, improper body mechanics, and pain.   ACTIVITY LIMITATIONS: bending, standing, squatting, sleeping, stairs, transfers, bed mobility, and locomotion level  PARTICIPATION LIMITATIONS: meal prep, cleaning, laundry, driving, shopping, community activity, occupation, yard work, and working with his show horses  PERSONAL FACTORS: Past/current experiences, Profession, Time since onset of injury/illness/exacerbation, and 3+ comorbidities: R TKA 01/19/19; L knee arthroscopy with medial menisectomy 09/2021; Lupus; L  acoustic neuroma; asthma; skin cancer; morbid obesity; CKD-III; DVT 2016; anxiety; depression  are also affecting patient's functional outcome.   REHAB POTENTIAL: Excellent  CLINICAL DECISION MAKING: Stable/uncomplicated  EVALUATION COMPLEXITY: Low   GOALS: Goals reviewed with patient? Yes  SHORT TERM GOALS: Target date: 07/20/2022   Patient will be independent with initial HEP. Baseline:  Goal status: IN PROGRESS  2.  Patient will demonstrate improved L knee AROM to >/= 2-115 deg to promote normal gait and stair mechanics. Baseline: L knee AROM 5-108 Goal status: IN PROGRESS 20 LONG TERM GOALS: Target date: 08/10/2022    Patient will be independent with advanced/ongoing HEP to improve outcomes and carryover.  Baseline:  Goal status: IN PROGRESS  2.  Patient will report at least 75% improvement in L knee pain to improve QOL. Baseline:  Goal status: IN PROGRESS  3.  Patient will demonstrate improved L knee AROM to >/= 0-120 deg to allow  for normal gait and stair mechanics. Baseline: L knee AROM 5-108 Goal status: IN PROGRESS  4.  Patient will demonstrate improved B LE strength to >/= 4+/5 for improved stability and ease of mobility. Baseline: Refer to MMT table above Goal status: IN PROGRESS  5.  Patient will be able to ambulate 600' with or w/o LRAD and normal gait pattern without increased pain to access community.  Baseline:  Goal status: IN PROGRESS  6. Patient will be able to ascend/descend stairs with 1 HR and reciprocal step pattern safely to access home and community.  Baseline:  Goal status: IN PROGRESS  7.  Patient will report >/= 48/80 on LEFS to demonstrate improved functional ability. Baseline: 39 / 80 = 48.8 % Goal status: IN PROGRESS  8.  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 Baseline:  Goal status: IN PROGRESS    PLAN:  PT FREQUENCY: 2x/week  PT DURATION: 4-6 weeks  PLANNED INTERVENTIONS: Therapeutic exercises, Therapeutic activity, Neuromuscular re-education, Balance training, Gait training, Patient/Family education, Self Care, Joint mobilization, Stair training, DME instructions, Dry Needling, Electrical stimulation, Cryotherapy, Moist heat, scar mobilization, Taping, Vasopneumatic device, Ultrasound, Ionotophoresis '4mg'$ /ml Dexamethasone, Manual therapy, and Re-evaluation  PLAN FOR NEXT SESSION: Review initial HEP PRN; gait training with SPC to normalize gait pattern; left knee ROM; LE strengthening working towards increased weightbearing tolerance on left LE   Percival Spanish, PT 07/09/2022, 10:50 AM

## 2022-07-13 ENCOUNTER — Ambulatory Visit: Payer: Medicare Other | Admitting: Physical Therapy

## 2022-07-13 ENCOUNTER — Encounter: Payer: Self-pay | Admitting: Physical Therapy

## 2022-07-13 DIAGNOSIS — M25662 Stiffness of left knee, not elsewhere classified: Secondary | ICD-10-CM | POA: Diagnosis not present

## 2022-07-13 DIAGNOSIS — R600 Localized edema: Secondary | ICD-10-CM

## 2022-07-13 DIAGNOSIS — R262 Difficulty in walking, not elsewhere classified: Secondary | ICD-10-CM

## 2022-07-13 DIAGNOSIS — M6281 Muscle weakness (generalized): Secondary | ICD-10-CM | POA: Diagnosis not present

## 2022-07-13 DIAGNOSIS — R2689 Other abnormalities of gait and mobility: Secondary | ICD-10-CM

## 2022-07-13 DIAGNOSIS — M25562 Pain in left knee: Secondary | ICD-10-CM | POA: Diagnosis not present

## 2022-07-13 NOTE — Therapy (Signed)
OUTPATIENT PHYSICAL THERAPY TREATMENT   Progress Note  Reporting Period 06/29/22 to 07/13/2022  See note below for Objective Data and Assessment of Progress/Goals.    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 [name] at [facility], left kidney is non=functioning.  right kidny is at 50%   Clotting disorder ([place])    Hx DVT - Xarelto   Colon polyps    Complication of anesthesia    Post op nausea/vomiting   Depression    severe.  dx 1985   DVT (deep venous thrombosis) ([place])    sees Dr. [name]    History of kidney stones    has kidney stone now.     Hypertension    Hypogonadism male    sees Dr. Baruch Gouty at [facility]   Neuropathy of both feet    OSA (obstructive sleep apnea)    Plantar fasciitis    rt foot   Pneumonia    PONV (postoperative nausea and vomiting)    Renal atrophy, left    sees Dr. Baruch Gouty at [facility]   Sleep apnea    tested 2010  - wears c-pap   Past Surgical History:  Procedure Laterality Date   CHEST WALL TUMOR EXCISION  2007   COLONOSCOPY  04/07/2020   per Dr. Hilarie Fredrickson, adenomatous polyps, repeat in 3 yrs    kidney caluculs  2004-05   KIDNEY STONE SURGERY     x 2c    KNEE ARTHROSCOPY  09/10/2011   right knee, per Dr. Dorna Leitz    KNEE ARTHROSCOPY Right 04/25/2017   Procedure: RIGHT KNEE ARTHROSCOPY, PARTIAL LATERAL MENISECTOMY, CHONDROPLASTY MEDIAL AND LATERAL PATELLAOFEMORAL;  Surgeon: Dorna Leitz, MD;  Location: [facility];  Service: Orthopedics;  Laterality: Right;   KNEE ARTHROSCOPY Left 09/18/2021   Procedure: ARTHROSCOPY KNEE;  Surgeon: Dorna Leitz, MD;  Location: [facility];  Service: Orthopedics;  Laterality: Left;   KNEE ARTHROSCOPY WITH MEDIAL MENISECTOMY Left 09/18/2021   Procedure: KNEE ARTHROSCOPY WITH MEDIAL MENISECTOMY;  Surgeon: Dorna Leitz, MD;  Location: [facility];  Service: Orthopedics;  Laterality: Left;   madiscus cartilage  2009   MOHS SURGERY     right knee arthroscopy  2008   TONSILLECTOMY     TOTAL KNEE ARTHROPLASTY Right 01/19/2019   Procedure: RIGHT TOTAL KNEE ARTHROPLASTY;  Surgeon: Dorna Leitz, MD;  Location: [facility];  Service: Orthopedics;  Laterality: Right;   TOTAL KNEE ARTHROPLASTY Left  06/14/2022   Procedure: TOTAL KNEE ARTHROPLASTY;  Surgeon: Dorna Leitz, MD;  Location: [facility];  Service: Orthopedics;  Laterality: Left;   UPPER GASTROINTESTINAL ENDOSCOPY     Patient Active Problem List   Diagnosis Date Noted   Preoperative respiratory examination 04/26/2022   Morbid obesity ([place]) 03/26/2022   Primary osteoarthritis of left knee 81/19/1478   [place] of knee, left 29/56/2130   Complex tear of lat mensc, current injury, left knee, init 09/18/2021   Acute meniscal tear, medial, left, initial encounter 09/18/2021   Hyperglycemia 05/30/2019   Primary osteoarthritis of right knee 01/19/2019   CKD (chronic kidney disease), stage III ([place]) 09/13/2017   Renal atrophy, left 09/13/2017   Systemic lupus erythematosus ([place]) 09/13/2017   Dyslipidemia 09/13/2017   Acute meniscal tear, lateral, right, initial encounter 04/25/2017   Osteoarthritis of right knee 04/25/2017   Concussion with loss of consciousness 08/05/2015   Laceration  of spleen 08/05/2015   Lumbar stress fracture 08/05/2015   DVT (deep venous thrombosis) ([place]) 10/18/2014   Hx of bacterial pneumonia 03/10/2013   COLONIC POLYPS, HX OF 04/08/2010   NEPHROLITHIASIS, HX OF 04/08/2010   ACOUSTIC NEUROMA 04/07/2010   Hypogonadism male 04/07/2010   Depression with anxiety 04/07/2010   SLEEP APNEA, OBSTRUCTIVE 04/07/2010   Asthma 04/07/2010   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   ERECTILE DYSFUNCTION, 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   PLANTAR FASCIITIS 04/07/2010    PCP: [name], MD   REFERRING PROVIDER: Dorna Leitz, MD   REFERRING DIAG: [phone] (ICD-10-CM) - S/P TKR (total knee replacement)   THERAPY DIAG:  Acute pain of left knee  Muscle weakness (generalized)  Stiffness of left knee, not elsewhere classified  Other abnormalities of gait and mobility  Difficulty in walking, not elsewhere classified  Localized edema  RATIONALE FOR EVALUATION AND TREATMENT: Rehabilitation  ONSET DATE: 06/14/22 - L TKA   NEXT MD VISIT: 07/15/22   SUBJECTIVE:                                                                                                                                                                                                         SUBJECTIVE STATEMENT: Pt reports his pain has been improving but his knee still stiffens up overnight.  PAIN: Are you having pain? Yes: NPRS scale: 3/10 Pain location: L anterior knee Pain description: sharp, sometimes mild, sometimes excruciating Aggravating factors: bending or straightening knee too quickly, twisting knee Relieving factors: sitting with leg propped up, ice  PERTINENT HISTORY:  R TKA 01/19/19; L knee arthroscopy with medial menisectomy 09/2021; Lupus; L  acoustic neuroma - pt reports minor balance issues related to tumor; asthma; skin cancer; morbid obesity; CKD-III; DVT 2016; anxiety; depression  PRECAUTIONS: None  WEIGHT BEARING RESTRICTIONS: No  FALLS:  Has patient fallen in last  6 months? 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 Wears compression stocking 24 hrs most days at MD recommendation.  MUSCLE LENGTH: Hamstrings: mod tight L>R ITB: WFL Piriformis: NT Hip flexors: mild tight B Quads: mild/mod tight L Heelcord: NT  POSTURE:  weight shift right  PALPATION: Good lateral L patellar mobility  LOWER EXTREMITY ROM:  Active ROM Right eval Left eval Left 07/09/22 Left 07/13/22  Knee flexion 130 108 116 seated 125 supine 125 supine  Knee extension 0 17 seated LAQ 5 supine 9 seated LAQ 2 supine 6 seated LAQ 2 supine   Passive ROM Right eval Left eval  Knee flexion 131 111  Knee extension 0 3  (Blank rows = not tested)  LOWER EXTREMITY MMT:  MMT Right eval Left eval Right 07/13/22 Left 07/13/22  Hip flexion 4+ 4 4+ 4  Hip extension 4 4 4+ 4  Hip abduction 4- 4- 4 4-  Hip adduction 4 4- 4+ 4  Hip internal rotation      Hip external rotation      Knee flexion '5 4 5 4 '$ *  Knee extension 5 4- * 5 4+   Ankle dorsiflexion 5 4+ 5 4+  Ankle plantarflexion      Ankle inversion      Ankle eversion       (Blank rows = not tested) * = pain in anterior knee  GAIT: Distance walked: 60 ft Assistive device utilized: Single point  cane Level of assistance: Modified independence and SBA Gait pattern: step through pattern, decreased stance time- Left, decreased stride length, decreased hip/knee flexion- Left, and antalgic Comments: decreased gait speed   TODAY'S TREATMENT:   07/13/22 THERAPEUTIC EXERCISE: to improve flexibility, strength and mobility.  Verbal and tactile cues throughout for technique.  Rec bike - L3 x 6 min Standing RTB L/R 4-way SLR x 10; 2 pole A for balance Seated Fitter leg press (2 black/1 blue) 2 x 10 L fwd 6" step-up x 10 L lateral 6" step-up x 10 L lateral 6" step-down with toe tap to floor x 10, unable to complete heel tap due to limited eccentric control Seated hip ADD isometric + L LAQ with 2# ankle weight 2 x 10 Standing blue TB TKE 2 x 10; UE support on back of chair for balance  THERAPEUTIC ACTIVITIES: L knee ROM & MMT  MANUAL THERAPY: To promote improved flexibility, improved joint mobility, increased ROM, and reduced pain. L knee - patellar mobs all directions L knee incisional scar massage   07/09/22 THERAPEUTIC EXERCISE: to improve flexibility,  strength and mobility.  Verbal and tactile cues throughout for technique. Rec bike - L3 x 6 min Standing L knee TKE into ball on wall 10 x 5", 2 sets Seated hip adduction ball squeeze + L LAQ 2 x 10 Seated RTB HS curl 2 x 10 Standing RTB L 4-way SLR x 10  MANUAL THERAPY: To promote improved flexibility, improved joint mobility, increased ROM, and reduced pain.  L knee - patellar mobs all directions L knee incisional scar massage  SELF CARE:  Provided instruction in self-scar mobilization/massage as well as patellar mobs Answered pt's questions regarding working with his horses   07/06/22 THERAPEUTIC EXERCISE: to improve flexibility, strength and mobility.  Verbal and tactile cues throughout for technique. Rec bike - L1 x 6 min Supine L quad set + SLR x 10 Supine L SAQ over 8" bolster x 10 Supine quad stretch with strap 2x30 sec  LLE Supine L AAROM HS curl/knee flexion with heels on peanut ball + strap assist 10x with 5 sec hold Supine L quad set 10 x 5" Standing hip abduction, extension, marches x 10 bil  MANUAL THERAPY: To promote normalized muscle tension, improved flexibility, increased ROM, and reduced pain. IASTM with EDGE tool to left medial knee and along incision, scar massage and education on self scar massage L knee patellar mobs - all directions   PATIENT EDUCATION:  Education details: progress with PT and ongoing PT POC Person educated: Patient Education method: Explanation Education comprehension: verbalized understanding  HOME EXERCISE PROGRAM: Access Code: [email or web address] URL: [email or web address] Date: 07/09/2022 Prepared by: Annie Paras  Exercises - Hooklying Hamstring Stretch with Strap  - 2-3 x daily - 7 x weekly - 3 reps - 30 sec hold - Supine Quadriceps Stretch with Strap on Table  - 2-3 x daily - 7 x weekly - 3 reps - 30 sec hold - Standing Hip Abduction with Counter Support  - 1 x daily - 7 x weekly - 2 sets - 10 reps - 2-3 sec hold - Standing Hip Extension with Counter Support  - 1 x daily - 7 x weekly - 2 sets - 10 reps - 2-3 sec hold - Standing Hip Flexion with Counter Support  - 1 x daily - 7 x weekly - 2 sets - 10 reps - 2-3 sec hold - Standing March with Counter Support  - 1 x daily - 7 x weekly - 2 sets - 10 reps - 2-3 sec hold - Gastroc Stretch on Wall  - 2-3 x daily - 7 x weekly - 3 reps - 30 sec hold   ASSESSMENT:  CLINICAL IMPRESSION: Joseph "Ron" reports his pain has been improving and he feels less unsteady when getting up in the morning with decreasing reliance on his cane, although he continues to note pre- and infrapatellar anterior knee pain with some activities and resisted MMT. L knee ROM progressing well with AROM now 2-125 with 6 extension lacking in LAQ. He continues to demonstrate proximal LE weakness, most notably in hips and eccentric quad control,  which continues to impact his gait pattern and ability to navigate stairs. STGs now met. TE continuing to focus on addressing strength deficits with good tolerance for exercise progression.  Joseph "Ron" will continue to benefit from skilled PT to address above deficits to improve mobility and activity tolerance with decreased pain interference.   OBJECTIVE IMPAIRMENTS: Abnormal gait, decreased activity tolerance, decreased balance, decreased endurance, decreased knowledge of use of DME, decreased mobility, difficulty walking, decreased ROM,  decreased strength, hypomobility, increased edema, increased fascial restrictions, impaired perceived functional ability, increased muscle spasms, impaired flexibility, impaired sensation, improper body mechanics, and pain.   ACTIVITY LIMITATIONS: bending, standing, squatting, sleeping, stairs, transfers, bed mobility, and locomotion level  PARTICIPATION LIMITATIONS: meal prep, cleaning, laundry, driving, shopping, community activity, occupation, yard work, and working with his show horses  PERSONAL FACTORS: Past/current experiences, Profession, Time since onset of injury/illness/exacerbation, and 3+ comorbidities: R TKA 01/19/19; L knee arthroscopy with medial menisectomy 09/2021; Lupus; L acoustic neuroma; asthma; skin cancer; morbid obesity; CKD-III; DVT 2016; anxiety; depression  are also affecting patient's functional outcome.   REHAB POTENTIAL: Excellent  CLINICAL DECISION MAKING: Stable/uncomplicated  EVALUATION COMPLEXITY: Low   GOALS: Goals reviewed with patient? Yes  SHORT TERM GOALS: Target date: 07/20/2022   Patient will be independent with initial HEP. Baseline:  Goal status: MET  07/13/22  2.  Patient will demonstrate improved L knee AROM to >/= 2-115 deg to promote normal gait and stair mechanics. Baseline: L knee AROM 5-108 Goal status: MET  07/09/22 - L knee AROM 2-125  LONG TERM GOALS: Target date: 08/10/2022   Patient will be  independent with advanced/ongoing HEP to improve outcomes and carryover.  Baseline:  Goal status: IN PROGRESS  2.  Patient will report at least 75% improvement in L knee pain to improve QOL. Baseline:  Goal status: IN PROGRESS  3.  Patient will demonstrate improved L knee AROM to >/= 0-120 deg to allow for normal gait and stair mechanics. Baseline: L knee AROM 5-108 Goal status: IN PROGRESS  07/13/22 - Met for flexion ROM - L knee AROM 2-125  4.  Patient will demonstrate improved B LE strength to >/= 4+/5 for improved stability and ease of mobility. Baseline: Refer to MMT table above Goal status: IN PROGRESS  5.  Patient will be able to ambulate 600' with or w/o LRAD and normal gait pattern without increased pain to access community.  Baseline:  Goal status: IN PROGRESS  6. Patient will be able to ascend/descend stairs with 1 HR and reciprocal step pattern safely to access home and community.  Baseline:  Goal status: IN PROGRESS  7.  Patient will report >/= 48/80 on LEFS to demonstrate improved functional ability. Baseline: 39 / 80 = 48.8 % Goal status: IN PROGRESS  8.  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 Baseline:  Goal status: IN PROGRESS    PLAN:  PT FREQUENCY: 2x/week  PT DURATION: 4-6 weeks  PLANNED INTERVENTIONS: Therapeutic exercises, Therapeutic activity, Neuromuscular re-education, Balance training, Gait training, Patient/Family education, Self Care, Joint mobilization, Stair training, DME instructions, Dry Needling, Electrical stimulation, Cryotherapy, Moist heat, scar mobilization, Taping, Vasopneumatic device, Ultrasound, Ionotophoresis '4mg'$ /ml Dexamethasone, Manual therapy, and Re-evaluation  PLAN FOR NEXT SESSION: gait training w/o AD to normalize gait pattern; left knee ROM; LE strengthening working towards increased weightbearing tolerance on left LE - update HEP for hip and quad  strengthening progression   Percival Spanish, PT 07/13/2022, 10:41 AM

## 2022-07-14 ENCOUNTER — Inpatient Hospital Stay: Payer: Medicare Other | Attending: Hematology & Oncology

## 2022-07-14 ENCOUNTER — Other Ambulatory Visit: Payer: Self-pay | Admitting: Family

## 2022-07-14 ENCOUNTER — Other Ambulatory Visit: Payer: Self-pay

## 2022-07-14 ENCOUNTER — Encounter: Payer: Self-pay | Admitting: Family

## 2022-07-14 ENCOUNTER — Inpatient Hospital Stay (HOSPITAL_BASED_OUTPATIENT_CLINIC_OR_DEPARTMENT_OTHER): Payer: Medicare Other | Admitting: Family

## 2022-07-14 ENCOUNTER — Inpatient Hospital Stay: Payer: Medicare Other

## 2022-07-14 VITALS — BP 111/71 | HR 88 | Temp 98.0°F | Resp 18 | Ht 72.0 in | Wt 251.1 lb

## 2022-07-14 DIAGNOSIS — N261 Atrophy of kidney (terminal): Secondary | ICD-10-CM | POA: Diagnosis not present

## 2022-07-14 DIAGNOSIS — E0844 Diabetes mellitus due to underlying condition with diabetic amyotrophy: Secondary | ICD-10-CM

## 2022-07-14 DIAGNOSIS — D510 Vitamin B12 deficiency anemia due to intrinsic factor deficiency: Secondary | ICD-10-CM

## 2022-07-14 DIAGNOSIS — I82401 Acute embolism and thrombosis of unspecified deep veins of right lower extremity: Secondary | ICD-10-CM

## 2022-07-14 DIAGNOSIS — E538 Deficiency of other specified B group vitamins: Secondary | ICD-10-CM | POA: Insufficient documentation

## 2022-07-14 DIAGNOSIS — D509 Iron deficiency anemia, unspecified: Secondary | ICD-10-CM

## 2022-07-14 DIAGNOSIS — Z86718 Personal history of other venous thrombosis and embolism: Secondary | ICD-10-CM | POA: Insufficient documentation

## 2022-07-14 DIAGNOSIS — N289 Disorder of kidney and ureter, unspecified: Secondary | ICD-10-CM | POA: Insufficient documentation

## 2022-07-14 DIAGNOSIS — Z79899 Other long term (current) drug therapy: Secondary | ICD-10-CM | POA: Diagnosis not present

## 2022-07-14 DIAGNOSIS — R7303 Prediabetes: Secondary | ICD-10-CM

## 2022-07-14 LAB — CBC WITH DIFFERENTIAL (CANCER CENTER ONLY)
Abs Immature Granulocytes: 0.02 10*3/uL (ref 0.00–0.07)
Basophils Absolute: 0.1 10*3/uL (ref 0.0–0.1)
Basophils Relative: 2 %
Eosinophils Absolute: 0.4 10*3/uL (ref 0.0–0.5)
Eosinophils Relative: 8 %
HCT: 40.4 % (ref 39.0–52.0)
Hemoglobin: 13.1 g/dL (ref 13.0–17.0)
Immature Granulocytes: 0 %
Lymphocytes Relative: 18 %
Lymphs Abs: 0.9 10*3/uL (ref 0.7–4.0)
MCH: 31.2 pg (ref 26.0–34.0)
MCHC: 32.4 g/dL (ref 30.0–36.0)
MCV: 96.2 fL (ref 80.0–100.0)
Monocytes Absolute: 0.3 10*3/uL (ref 0.1–1.0)
Monocytes Relative: 6 %
Neutro Abs: 3.3 10*3/uL (ref 1.7–7.7)
Neutrophils Relative %: 66 %
Platelet Count: 187 10*3/uL (ref 150–400)
RBC: 4.2 MIL/uL — ABNORMAL LOW (ref 4.22–5.81)
RDW: 12.2 % (ref 11.5–15.5)
WBC Count: 5 10*3/uL (ref 4.0–10.5)
nRBC: 0 % (ref 0.0–0.2)

## 2022-07-14 LAB — CMP (CANCER CENTER ONLY)
ALT: 16 U/L (ref 0–44)
AST: 17 U/L (ref 15–41)
Albumin: 3.6 g/dL (ref 3.5–5.0)
Alkaline Phosphatase: 83 U/L (ref 38–126)
Anion gap: 8 (ref 5–15)
BUN: 17 mg/dL (ref 8–23)
CO2: 23 mmol/L (ref 22–32)
Calcium: 8.9 mg/dL (ref 8.9–10.3)
Chloride: 108 mmol/L (ref 98–111)
Creatinine: 1.87 mg/dL — ABNORMAL HIGH (ref 0.61–1.24)
GFR, Estimated: 38 mL/min — ABNORMAL LOW (ref >60)
Glucose, Bld: 112 mg/dL — ABNORMAL HIGH (ref 70–99)
Potassium: 3.8 mmol/L (ref 3.5–5.1)
Sodium: 139 mmol/L (ref 135–145)
Total Bilirubin: 0.3 mg/dL (ref 0.3–1.2)
Total Protein: 6.9 g/dL (ref 6.5–8.1)

## 2022-07-14 LAB — FERRITIN: Ferritin: 82 ng/mL (ref 24–336)

## 2022-07-14 LAB — IRON AND IRON BINDING CAPACITY (CC-WL,HP ONLY)
Iron: 61 ug/dL (ref 45–182)
Saturation Ratios: 23 % (ref 17.9–39.5)
TIBC: 270 ug/dL (ref 250–450)
UIBC: 209 ug/dL (ref 117–376)

## 2022-07-14 LAB — TSH: TSH: 1.953 u[IU]/mL (ref 0.350–4.500)

## 2022-07-14 LAB — VITAMIN B12: Vitamin B-12: 425 pg/mL (ref 180–914)

## 2022-07-14 LAB — D-DIMER, QUANTITATIVE: D-Dimer, Quant: 3.31 ug/mL-FEU — ABNORMAL HIGH (ref 0.00–0.50)

## 2022-07-14 MED ORDER — CYANOCOBALAMIN 1000 MCG/ML IJ SOLN
1000.0000 ug | Freq: Once | INTRAMUSCULAR | Status: AC
  Administered 2022-07-14: 1000 ug via INTRAMUSCULAR

## 2022-07-14 NOTE — Patient Instructions (Signed)

## 2022-07-14 NOTE — Progress Notes (Signed)
Hematology and Oncology Follow Up Visit  Joseph Tran [id number] 1950-12-22 72 y.o. 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 He is doing fairly well. 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 He follows up with his surgeon Dr. Berenice Primas tomorrow.  He is staying busy and notes fatigue at times.  No falls or syncope reported. He is ambulating with a cane for added support.  He has not noted any swelling.  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  No fever, chills, n/v, cough, rash, dizziness, SOB, chest pain, palpitations, abdominal pain or changes in bowel or bladder habits.  No blood loss noted. No bruising or petechiae.  Appetite and hydration are good. Weight is stable at 251 lbs.   ECOG Performance Status: 1 - Symptomatic but completely ambulatory  Medications:  Allergies as of 07/14/2022       Reactions   Allopurinol Other (See Comments)   PATIENT PREFERENCE Pt refused due to mom developing steven johnson syndrome.   Penicillins Hives   Has patient had a PCN reaction causing immediate rash, facial/tongue/throat swelling, SOB or lightheadedness with hypotension: Unknown Has patient had a PCN reaction causing severe rash involving mucus membranes or skin necrosis:Unknown Has patient had a PCN reaction that required hospitalization: No Has patient had a PCN reaction occurring within the last 10 years: No If all of the above answers are "NO", then may proceed with Cephalosporin use.   Sulfamethoxazole Hives   Sulfonamide Derivatives Hives   Dilaudid [hydromorphone Hcl] Nausea And Vomiting        Medication List        Accurate as of July 14, 2022 10:57 AM. If you  have any questions, ask your nurse or doctor.          STOP taking these medications    traMADol 50 MG tablet Commonly known as: ULTRAM Stopped by: Lottie Dawson, NP       TAKE these medications    acetaminophen 500 MG tablet Commonly known as: TYLENOL Take 1,000 mg by mouth every 8 (eight) hours as needed for moderate pain or mild pain.   albuterol 108 (90 Base) MCG/ACT inhaler Commonly known as: ProAir HFA INHALE 2 PUFFS EVERY 4  HOURS AS NEEDED FOR  WHEEZING OR SHORTNESS OF  BREATH   Apple Cider Vinegar 500 MG Tabs Take 600 mg by mouth daily.   aspirin EC 81 MG tablet Take 162 mg by mouth daily.   b complex vitamins capsule Take 1 capsule by mouth daily.   bisacodyl 5 MG EC tablet Commonly known as: DULCOLAX Take 5 mg by mouth at bedtime.   buPROPion 300 MG 24 hr tablet Commonly known as: WELLBUTRIN XL TAKE 1 TABLET BY MOUTH DAILY   Cholecalciferol 50 MCG (2000 UT) Tabs Take 2,000 Units by mouth daily.   clindamycin 300 MG capsule Commonly known as: CLEOCIN Take 600 mg by mouth once.   clonazePAM 1 MG tablet Commonly known as: KLONOPIN Take 1 tablet (1 mg total) by mouth 2 (two) times daily as needed for anxiety. for anxiety   desvenlafaxine 50 MG 24 hr tablet Commonly known as: Pristiq  Take 1 tablet (50 mg total) by mouth daily.   diltiazem 2 % Gel Apply 1 application. topically daily as needed (Anal fissuresanal). prn   diltiazem 240 MG 24 hr capsule Commonly known as: CARDIZEM CD Take 240 mg by mouth daily.   docusate sodium 100 MG capsule Commonly known as: Colace Take 1 capsule (100 mg total) by mouth 2 (two) times daily. What changed: Another medication with the same name was removed. Continue taking this medication, and follow the directions you see here. Changed by: Lottie Dawson, NP   furosemide 20 MG tablet Commonly known as: LASIX TAKE 1 TABLET BY MOUTH  DAILY AS NEEDED   gabapentin 300 MG capsule Commonly known as:  NEURONTIN TAKE 1 CAPSULE(300 MG) BY MOUTH AT BEDTIME   hydroxypropyl methylcellulose / hypromellose 2.5 % ophthalmic solution Commonly known as: ISOPTO TEARS / GONIOVISC Place 1-2 drops into both eyes 3 (three) times daily as needed for dry eyes ((SCHEDULED EACH MORNING)).   ondansetron 8 MG tablet Commonly known as: Zofran Take 1 tablet (8 mg total) by mouth every 8 (eight) hours as needed for nausea or vomiting.   oxyCODONE 5 MG immediate release tablet Commonly known as: Oxy IR/ROXICODONE Take 5 mg by mouth every 4 (four) hours as needed for severe pain.   oxyCODONE-acetaminophen 5-325 MG tablet Commonly known as: PERCOCET/ROXICET Take 1-2 tablets by mouth every 6 (six) hours as needed for severe pain.   psyllium 58.6 % packet Commonly known as: METAMUCIL Take 1 packet by mouth at bedtime.   rivaroxaban 2.5 MG Tabs tablet Commonly known as: Xarelto Take 2 tablets (5 mg total) by mouth daily.   Semaglutide (1 MG/DOSE) 4 MG/3ML Sopn Inject 1 mg as directed once a week. What changed: additional instructions   tadalafil 5 MG tablet Commonly known as: Cialis Take 1 tablet (5 mg total) by mouth daily as needed for erectile dysfunction.   tamsulosin 0.4 MG Caps capsule Commonly known as: FLOMAX Take 1 capsule (0.4 mg total) by mouth daily.   tiZANidine 2 MG tablet Commonly known as: ZANAFLEX Take 1 tablet (2 mg total) by mouth every 8 (eight) hours as needed for muscle spasms.        Allergies:  Allergies  Allergen Reactions   Allopurinol Other (See Comments)    PATIENT PREFERENCE Pt refused due to mom developing steven johnson syndrome.   Penicillins Hives    Has patient had a PCN reaction causing immediate rash, facial/tongue/throat swelling, SOB or lightheadedness with hypotension: Unknown Has patient had a PCN reaction causing severe rash involving mucus membranes or skin necrosis:Unknown Has patient had a PCN reaction that required hospitalization: No Has  patient had a PCN reaction occurring within the last 10 years: No If all of the above answers are "NO", then may proceed with Cephalosporin use.    Sulfamethoxazole Hives   Sulfonamide Derivatives Hives   Dilaudid [Hydromorphone Hcl] Nausea And Vomiting    Past Medical History, Surgical history, Social history, and Family History were reviewed and updated.  Review of Systems: All other 10 point review of systems is negative.   Physical Exam:  height is 6' (1.829 m) and weight is 251 lb 1.3 oz (113.9 kg). His oral temperature is 98 F (36.7 C). His blood pressure is 111/71 and his pulse is 88. His respiration is 18 and oxygen saturation is 96%.   Wt Readings from Last 3 Encounters:  07/14/22 251 lb 1.3 oz (113.9 kg)  06/14/22 252 lb (114.3 kg)  06/03/22 252 lb (114.3 kg)    Ocular: Sclerae unicteric, pupils equal, round and reactive to light Ear-nose-throat: Oropharynx clear, dentition fair Lymphatic: No cervical or supraclavicular adenopathy Lungs no rales or rhonchi, good excursion bilaterally Heart regular rate and rhythm, no murmur appreciated Abd soft, nontender, positive bowel sounds MSK no focal spinal tenderness, no joint edema Neuro: non-focal, well-oriented, appropriate affect Breasts: Deferred    Lab Results  Component Value Date   WBC 5.0 07/14/2022   HGB 13.1 07/14/2022   HCT 40.4 07/14/2022   MCV 96.2 07/14/2022   PLT 187 07/14/2022   Lab Results  Component Value Date   FERRITIN 116 03/31/2017   IRON 101 03/31/2017   TIBC 286 03/31/2017   UIBC 185 03/31/2017   IRONPCTSAT 35 03/31/2017   Lab Results  Component Value Date   RBC 4.20 (L) 07/14/2022   No results found for: "KPAFRELGTCHN", "LAMBDASER", "KAPLAMBRATIO" No results found for: "IGGSERUM", "IGA", "IGMSERUM" No results found for: "TOTALPROTELP", "ALBUMINELP", "A1GS", "A2GS", "BETS", "BETA2SER", "GAMS", "MSPIKE", "SPEI"   Chemistry      Component Value Date/Time   NA 138 06/03/2022 1317    NA 145 06/29/2017 1452   K 4.1 06/03/2022 1317   K 4.7 06/29/2017 1452   CL 106 06/03/2022 1317   CL 107 06/29/2017 1452   CO2 26 06/03/2022 1317   CO2 25 06/29/2017 1452   BUN 22 06/03/2022 1317   BUN 33 (H) 06/29/2017 1452   CREATININE 1.92 (H) 06/03/2022 1317   CREATININE 2.01 (H) 03/24/2022 0958   CREATININE 1.56 (H) 06/04/2020 0950      Component Value Date/Time   CALCIUM 9.5 06/03/2022 1317   CALCIUM 9.5 06/29/2017 1452   ALKPHOS 80 03/24/2022 0958   ALKPHOS 83 06/29/2017 1452   AST 15 03/24/2022 0958   ALT 14 03/24/2022 0958   ALT 23 06/29/2017 1452   BILITOT 0.6 03/24/2022 0958       Impression and Plan:  Mr. 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   He continues to do well on Xarelto and aspirin.  B 12 given today. Level pending.  Will continue monthly B 12 injection.  Follow-up in 4 months.   Lottie Dawson, NP 1/10/202410:57 AM

## 2022-07-16 ENCOUNTER — Ambulatory Visit: Payer: Medicare Other

## 2022-07-16 DIAGNOSIS — M25662 Stiffness of left knee, not elsewhere classified: Secondary | ICD-10-CM

## 2022-07-16 DIAGNOSIS — M6281 Muscle weakness (generalized): Secondary | ICD-10-CM

## 2022-07-16 DIAGNOSIS — R2689 Other abnormalities of gait and mobility: Secondary | ICD-10-CM | POA: Diagnosis not present

## 2022-07-16 DIAGNOSIS — R600 Localized edema: Secondary | ICD-10-CM | POA: Diagnosis not present

## 2022-07-16 DIAGNOSIS — M25562 Pain in left knee: Secondary | ICD-10-CM | POA: Diagnosis not present

## 2022-07-16 DIAGNOSIS — R262 Difficulty in walking, not elsewhere classified: Secondary | ICD-10-CM

## 2022-07-16 NOTE — Therapy (Signed)
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 [name] at [facility], left kidney is non=functioning.  right kidny is at 50%   Clotting disorder ([place])    Hx DVT - Xarelto   Colon polyps    Complication of anesthesia    Post op nausea/vomiting   Depression    severe.  dx 1985   DVT (deep venous thrombosis) ([place])    sees Dr. [name]    History of kidney stones    has kidney stone now.     Hypertension    Hypogonadism male    sees Dr. Baruch Gouty at [facility]   Neuropathy of both feet    OSA (obstructive sleep apnea)    Plantar fasciitis    rt foot   Pneumonia    PONV (postoperative nausea and vomiting)    Renal atrophy, left    sees Dr. Baruch Gouty at [facility]   Sleep apnea    tested 2010  - wears c-pap   Past Surgical History:  Procedure Laterality Date   CHEST WALL TUMOR EXCISION  2007   COLONOSCOPY  04/07/2020   per Dr. Hilarie Fredrickson, adenomatous polyps, repeat in 3 yrs    kidney caluculs  2004-05   KIDNEY STONE SURGERY     x 2c   KNEE ARTHROSCOPY  09/10/2011   right knee, per Dr. Dorna Leitz    KNEE ARTHROSCOPY Right 04/25/2017   Procedure: RIGHT  KNEE ARTHROSCOPY, PARTIAL LATERAL MENISECTOMY, CHONDROPLASTY MEDIAL AND LATERAL PATELLAOFEMORAL;  Surgeon: Dorna Leitz, MD;  Location: [place];  Service: Orthopedics;  Laterality: Right;   KNEE ARTHROSCOPY Left 09/18/2021   Procedure: ARTHROSCOPY KNEE;  Surgeon: Dorna Leitz, MD;  Location: [facility];  Service: Orthopedics;  Laterality: Left;   KNEE ARTHROSCOPY WITH MEDIAL MENISECTOMY Left 09/18/2021   Procedure: KNEE ARTHROSCOPY WITH MEDIAL MENISECTOMY;  Surgeon: Dorna Leitz, MD;  Location: [facility];  Service: Orthopedics;  Laterality: Left;   madiscus cartilage  2009   MOHS SURGERY     right knee arthroscopy  2008   TONSILLECTOMY     TOTAL KNEE ARTHROPLASTY Right 01/19/2019   Procedure: RIGHT TOTAL KNEE ARTHROPLASTY;  Surgeon: Dorna Leitz, MD;  Location: [facility];  Service: Orthopedics;  Laterality: Right;   TOTAL KNEE ARTHROPLASTY Left 06/14/2022   Procedure: TOTAL KNEE ARTHROPLASTY;  Surgeon: Dorna Leitz, MD;  Location: [facility];  Service:  Orthopedics;  Laterality: Left;   UPPER GASTROINTESTINAL ENDOSCOPY     Patient Active Problem List   Diagnosis Date Noted   Vitamin B 12 deficiency 07/14/2022   Preoperative respiratory examination 04/26/2022   Morbid obesity ([place]) 03/26/2022   Primary osteoarthritis of left knee 81/82/9937   Plica of knee, left 16/96/7893   Complex tear of lat mensc, current injury, left knee, init 09/18/2021   Acute meniscal tear, medial, left, initial encounter 09/18/2021   Hyperglycemia 05/30/2019   Primary osteoarthritis of right knee 01/19/2019   CKD (chronic kidney disease), stage III ([place]) 09/13/2017   Renal atrophy, left 09/13/2017   Systemic lupus erythematosus ([place]) 09/13/2017   Dyslipidemia 09/13/2017   Acute meniscal tear, lateral, right, initial encounter 04/25/2017   Osteoarthritis of right knee 04/25/2017   Concussion with loss of consciousness 08/05/2015   Laceration of spleen 08/05/2015   Lumbar stress fracture 08/05/2015   DVT (deep venous  thrombosis) ([place]) 10/18/2014   Hx of bacterial pneumonia 03/10/2013   COLONIC POLYPS, HX OF 04/08/2010   NEPHROLITHIASIS, HX OF 04/08/2010   ACOUSTIC NEUROMA 04/07/2010   Hypogonadism male 04/07/2010   Depression with anxiety 04/07/2010   SLEEP APNEA, OBSTRUCTIVE 04/07/2010   Asthma 04/07/2010   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   ERECTILE DYSFUNCTION, 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   PLANTAR FASCIITIS 04/07/2010    PCP: [name], MD   REFERRING PROVIDER: Dorna Leitz, MD   REFERRING DIAG: [phone] (ICD-10-CM) - S/P TKR (total knee replacement)   THERAPY DIAG:  Acute pain of left knee  Muscle weakness (generalized)  Stiffness of left knee, not elsewhere classified  Other abnormalities of gait and mobility  Difficulty in walking, not elsewhere classified  Localized edema  RATIONALE FOR EVALUATION AND TREATMENT: Rehabilitation  ONSET DATE: 06/14/22 - L TKA   NEXT MD VISIT: 08/19/22   SUBJECTIVE:                                                                                                                                                                                                         SUBJECTIVE STATEMENT: Pt reports he is still battling a lot of swelling in his L knee. Doctor wants him to continue compression hose daily to reduce swelling.  PAIN: Are you having pain? Yes: NPRS scale: 5-6/10 Pain location: L anterior knee, when turning in bed  Pain description: sharp, sometimes mild, sometimes excruciating Aggravating factors: bending or straightening knee too quickly, twisting knee Relieving factors: sitting with leg propped up, ice  PERTINENT HISTORY:  R TKA 01/19/19; L knee arthroscopy with  medial menisectomy 09/2021; Lupus; L acoustic neuroma - pt reports minor balance issues related to tumor; asthma; skin cancer; morbid obesity; CKD-III; DVT 2016; anxiety; depression  PRECAUTIONS: None  WEIGHT BEARING RESTRICTIONS: No  FALLS:  Has patient fallen in  last 6 months? 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 Wears compression stocking 24 hrs most days at MD recommendation.  MUSCLE LENGTH: Hamstrings: mod tight L>R ITB: WFL Piriformis: NT Hip flexors: mild tight B Quads: mild/mod tight L Heelcord: NT  POSTURE:  weight shift right  PALPATION: Good lateral L patellar mobility  LOWER EXTREMITY ROM:  Active ROM Right eval Left eval Left 07/09/22 Left 07/13/22  Knee flexion 130 108 116 seated 125 supine 125 supine  Knee extension 0 17 seated LAQ 5 supine 9 seated LAQ 2 supine 6 seated LAQ 2 supine   Passive ROM Right eval Left eval  Knee flexion 131 111  Knee extension 0 3  (Blank rows = not tested)  LOWER EXTREMITY MMT:  MMT Right eval Left eval Right 07/13/22 Left 07/13/22  Hip flexion 4+ 4 4+ 4  Hip extension 4 4 4+ 4  Hip abduction 4- 4- 4 4-  Hip adduction 4 4- 4+ 4  Hip internal rotation      Hip external rotation      Knee flexion '5 4 5 4 '$ *  Knee extension 5 4- * 5 4+   Ankle dorsiflexion 5 4+ 5 4+  Ankle plantarflexion      Ankle inversion      Ankle eversion       (Blank rows = not tested) * = pain in anterior knee  GAIT: Distance walked: 60 ft Assistive device utilized: Single point  cane Level of assistance: Modified independence and SBA Gait pattern: step through pattern, decreased stance time- Left, decreased stride length, decreased hip/knee flexion- Left, and antalgic Comments: decreased gait speed   TODAY'S TREATMENT:   07/16/22 THERAPEUTIC EXERCISE: to improve flexibility, strength and mobility.  Verbal and tactile cues throughout for technique.  Rec bike - L3 x 6 min L fwd step up 6' x 10  L lateral step up 6' x 10  L HS curl no weight x10 ; 2nd set x 15 2# weight Standing hip abduction 2# weights x 10 bil Standing hip extension 2# weights x 10 bil Standing hip flexion 2# weights x 10 bil Functional squats at counter x 10   MANUAL THERAPY: To promote improved flexibility, improved joint mobility, increased ROM, and reduced pain. L knee - patellar mobs all directions L knee PROM flexion and extension   07/13/22 THERAPEUTIC EXERCISE: to improve flexibility, strength and mobility.  Verbal and tactile cues throughout for technique.  Rec bike - L3 x 6 min Standing RTB L/R 4-way SLR x 10;  2 pole A for balance Seated Fitter leg press (2 black/1 blue) 2 x 10 L fwd 6" step-up x 10 L lateral 6" step-up x 10 L lateral 6" step-down with toe tap to floor x 10, unable to complete heel tap due to limited eccentric control Seated hip ADD isometric + L LAQ with 2# ankle weight 2 x 10 Standing blue TB TKE 2 x 10; UE support on back of chair for balance  THERAPEUTIC ACTIVITIES: L knee ROM & MMT  MANUAL THERAPY: To promote improved flexibility, improved joint mobility, increased ROM, and reduced pain. L knee - patellar mobs all directions L knee incisional scar massage   07/09/22 THERAPEUTIC EXERCISE: to improve flexibility, strength and mobility.  Verbal and tactile cues throughout for technique. Rec bike - L3 x 6 min Standing L knee TKE into ball on wall 10 x 5", 2 sets Seated hip adduction ball squeeze + L LAQ 2 x 10 Seated RTB HS curl 2 x 10 Standing RTB L  4-way SLR x 10  MANUAL THERAPY: To promote improved flexibility, improved joint mobility, increased ROM, and reduced pain.  L knee - patellar mobs all directions L knee incisional scar massage  SELF CARE:  Provided instruction in self-scar mobilization/massage as well as patellar mobs Answered pt's questions regarding working with his horses   07/06/22 THERAPEUTIC EXERCISE: to improve flexibility, strength and mobility.  Verbal and tactile cues throughout for technique. Rec bike - L1 x 6 min Supine L quad set + SLR x 10 Supine L SAQ over 8" bolster x 10 Supine quad stretch with strap 2x30 sec LLE Supine L AAROM HS curl/knee flexion with heels on peanut ball + strap assist 10x with 5 sec hold Supine L quad set 10 x 5" Standing hip abduction, extension, marches x 10 bil  MANUAL THERAPY: To promote normalized muscle tension, improved flexibility, increased ROM, and reduced pain. IASTM with EDGE tool to left medial knee and along incision, scar massage and education on self scar massage L knee patellar mobs - all directions   PATIENT EDUCATION:  Education details: progress with PT and ongoing PT POC Person educated: Patient Education method: Explanation Education comprehension: verbalized understanding  HOME EXERCISE PROGRAM: Access Code: [email or web address] URL: [email or web address] Date: 07/09/2022 Prepared by: Annie Paras  Exercises - Hooklying Hamstring Stretch with Strap  - 2-3 x daily - 7 x weekly - 3 reps - 30 sec hold - Supine Quadriceps Stretch with Strap on Table  - 2-3 x daily - 7 x weekly - 3 reps - 30 sec hold - Standing Hip Abduction with Counter Support  - 1 x daily - 7 x weekly - 2 sets - 10 reps - 2-3 sec hold - Standing Hip Extension with Counter Support  - 1 x daily - 7 x weekly - 2 sets - 10 reps - 2-3 sec hold - Standing Hip Flexion with Counter Support  - 1 x daily - 7 x weekly - 2 sets - 10 reps - 2-3 sec hold - Standing March with Counter Support  -  1 x daily - 7 x weekly - 2 sets - 10 reps - 2-3 sec hold - Gastroc Stretch on Wall  - 2-3 x daily - 7 x weekly - 3 reps - 30 sec hold   ASSESSMENT:  CLINICAL IMPRESSION: Continued with progression of exercises to improve knee strength to improve activity tolerance. No increased pain with interventions. The patella showed good mobility as well as the L knee. Pt  would continue to benefit from skilled PT to address remaining deficits.  OBJECTIVE IMPAIRMENTS: Abnormal gait, decreased activity tolerance, decreased balance, decreased endurance, decreased knowledge of use of DME, decreased mobility, difficulty walking, decreased ROM, decreased strength, hypomobility, increased edema, increased fascial restrictions, impaired perceived functional ability, increased muscle spasms, impaired flexibility, impaired sensation, improper body mechanics, and pain.   ACTIVITY LIMITATIONS: bending, standing, squatting, sleeping, stairs, transfers, bed mobility, and locomotion level  PARTICIPATION LIMITATIONS: meal prep, cleaning, laundry, driving, shopping, community activity, occupation, yard work, and working with his show horses  PERSONAL FACTORS: Past/current experiences, Profession, Time since onset of injury/illness/exacerbation, and 3+ comorbidities: R TKA 01/19/19; L knee arthroscopy with medial menisectomy 09/2021; Lupus; L acoustic neuroma; asthma; skin cancer; morbid obesity; CKD-III; DVT 2016; anxiety; depression  are also affecting patient's functional outcome.   REHAB POTENTIAL: Excellent  CLINICAL DECISION MAKING: Stable/uncomplicated  EVALUATION COMPLEXITY: Low   GOALS: Goals reviewed with patient? Yes  SHORT TERM GOALS: Target date: 07/20/2022   Patient will be independent with initial HEP. Baseline:  Goal status: MET  07/13/22  2.  Patient will demonstrate improved L knee AROM to >/= 2-115 deg to promote normal gait and stair mechanics. Baseline: L knee AROM 5-108 Goal status: MET   07/09/22 - L knee AROM 2-125  LONG TERM GOALS: Target date: 08/10/2022   Patient will be independent with advanced/ongoing HEP to improve outcomes and carryover.  Baseline:  Goal status: IN PROGRESS  2.  Patient will report at least 75% improvement in L knee pain to improve QOL. Baseline:  Goal status: IN PROGRESS  3.  Patient will demonstrate improved L knee AROM to >/= 0-120 deg to allow for normal gait and stair mechanics. Baseline: L knee AROM 5-108 Goal status: IN PROGRESS  07/13/22 - Met for flexion ROM - L knee AROM 2-125  4.  Patient will demonstrate improved B LE strength to >/= 4+/5 for improved stability and ease of mobility. Baseline: Refer to MMT table above Goal status: IN PROGRESS  5.  Patient will be able to ambulate 600' with or w/o LRAD and normal gait pattern without increased pain to access community.  Baseline:  Goal status: IN PROGRESS  6. Patient will be able to ascend/descend stairs with 1 HR and reciprocal step pattern safely to access home and community.  Baseline:  Goal status: IN PROGRESS  7.  Patient will report >/= 48/80 on LEFS to demonstrate improved functional ability. Baseline: 39 / 80 = 48.8 % Goal status: IN PROGRESS  8.  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 Baseline:  Goal status: IN PROGRESS    PLAN:  PT FREQUENCY: 2x/week  PT DURATION: 4-6 weeks  PLANNED INTERVENTIONS: Therapeutic exercises, Therapeutic activity, Neuromuscular re-education, Balance training, Gait training, Patient/Family education, Self Care, Joint mobilization, Stair training, DME instructions, Dry Needling, Electrical stimulation, Cryotherapy, Moist heat, scar mobilization, Taping, Vasopneumatic device, Ultrasound, Ionotophoresis '4mg'$ /ml Dexamethasone, Manual therapy, and Re-evaluation  PLAN FOR NEXT SESSION: gait training w/o AD to normalize gait pattern; left knee ROM; LE strengthening  working towards increased weightbearing tolerance on left LE - update HEP for hip and quad strengthening progression   Makaiyah Schweiger L Kissy Cielo, PTA 07/16/2022, 10:15 AM

## 2022-07-20 ENCOUNTER — Ambulatory Visit: Payer: Medicare Other | Admitting: Physical Therapy

## 2022-07-23 ENCOUNTER — Ambulatory Visit: Payer: Medicare Other

## 2022-07-23 DIAGNOSIS — M6281 Muscle weakness (generalized): Secondary | ICD-10-CM | POA: Diagnosis not present

## 2022-07-23 DIAGNOSIS — M25562 Pain in left knee: Secondary | ICD-10-CM

## 2022-07-23 DIAGNOSIS — M25662 Stiffness of left knee, not elsewhere classified: Secondary | ICD-10-CM | POA: Diagnosis not present

## 2022-07-23 DIAGNOSIS — R262 Difficulty in walking, not elsewhere classified: Secondary | ICD-10-CM

## 2022-07-23 DIAGNOSIS — R2689 Other abnormalities of gait and mobility: Secondary | ICD-10-CM | POA: Diagnosis not present

## 2022-07-23 DIAGNOSIS — R600 Localized edema: Secondary | ICD-10-CM | POA: Diagnosis not present

## 2022-07-23 NOTE — Therapy (Signed)
OUTPATIENT PHYSICAL THERAPY TREATMENT      Patient Name: Joseph Tran MRN: [id number] DOB:Dec 16, 1950, 72 y.o., male Today's Date: 07/23/2022   END OF SESSION:  PT End of Session - 07/23/22 1025     Visit Number 7    Date for PT Re-Evaluation 08/10/22    Authorization Type Medicare & AARP    Progress Note Due on Visit 15   MD PN on visit #5 - 07/13/22   PT Start Time 0933    PT Stop Time 1020    PT Time Calculation (min) 47 min    Activity Tolerance Patient tolerated treatment well    Behavior During Therapy WFL for tasks assessed/performed                Past Medical History:  Diagnosis Date   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 [name] at [facility], left kidney is non=functioning.  right kidny is at 50%   Clotting disorder ([place])    Hx DVT - Xarelto   Colon polyps    Complication of anesthesia    Post op nausea/vomiting   Depression    severe.  dx 1985   DVT (deep venous thrombosis) ([place])    sees Dr. [name]    History of kidney stones    has kidney stone now.     Hypertension    Hypogonadism male    sees Dr. Baruch Gouty at [facility]   Neuropathy of both feet    OSA (obstructive sleep apnea)    Plantar fasciitis    rt foot   Pneumonia    PONV (postoperative nausea and vomiting)    Renal atrophy, left    sees Dr. Baruch Gouty at [facility]   Sleep apnea    tested 2010  - wears c-pap   Past Surgical History:  Procedure Laterality Date   CHEST WALL TUMOR EXCISION  2007   COLONOSCOPY  04/07/2020   per Dr. Hilarie Fredrickson, adenomatous polyps, repeat in 3 yrs    kidney caluculs  2004-05   KIDNEY STONE SURGERY     x 2c   KNEE ARTHROSCOPY  09/10/2011   right knee, per Dr. Dorna Leitz    KNEE ARTHROSCOPY Right 04/25/2017   Procedure:  RIGHT KNEE ARTHROSCOPY, PARTIAL LATERAL MENISECTOMY, CHONDROPLASTY MEDIAL AND LATERAL PATELLAOFEMORAL;  Surgeon: Dorna Leitz, MD;  Location: [place];  Service: Orthopedics;  Laterality: Right;   KNEE ARTHROSCOPY Left 09/18/2021   Procedure: ARTHROSCOPY KNEE;  Surgeon: Dorna Leitz, MD;  Location: [facility];  Service: Orthopedics;  Laterality: Left;   KNEE ARTHROSCOPY WITH MEDIAL MENISECTOMY Left 09/18/2021   Procedure: KNEE ARTHROSCOPY WITH MEDIAL MENISECTOMY;  Surgeon: Dorna Leitz, MD;  Location: [facility];  Service: Orthopedics;  Laterality: Left;   madiscus cartilage  2009   MOHS SURGERY     right knee arthroscopy  2008   TONSILLECTOMY     TOTAL KNEE ARTHROPLASTY Right 01/19/2019   Procedure: RIGHT TOTAL KNEE ARTHROPLASTY;  Surgeon: Dorna Leitz, MD;  Location: [facility];  Service: Orthopedics;  Laterality: Right;   TOTAL KNEE ARTHROPLASTY Left 06/14/2022   Procedure: TOTAL KNEE ARTHROPLASTY;  Surgeon: Dorna Leitz, MD;  Location: [facility];  Service: Orthopedics;  Laterality: Left;   UPPER GASTROINTESTINAL ENDOSCOPY     Patient Active Problem List   Diagnosis Date Noted   Vitamin B 12 deficiency 07/14/2022   Preoperative respiratory examination 04/26/2022   Morbid obesity ([place]) 03/26/2022   Primary osteoarthritis of left knee 16/04/9603   Plica of knee, left 54/03/8118   Complex tear of lat mensc, current injury, left knee, init 09/18/2021   Acute meniscal tear, medial, left, initial encounter 09/18/2021   Hyperglycemia 05/30/2019   Primary osteoarthritis of right knee 01/19/2019   CKD (chronic kidney disease), stage III ([place]) 09/13/2017   Renal atrophy, left 09/13/2017   Systemic lupus erythematosus ([employer] [place]) 09/13/2017   Dyslipidemia 09/13/2017   Acute meniscal tear, lateral, right, initial encounter 04/25/2017   Osteoarthritis of right knee 04/25/2017   Concussion with loss of consciousness 08/05/2015   Laceration of spleen 08/05/2015   Lumbar stress fracture 08/05/2015   DVT (deep venous  thrombosis) ([place]) 10/18/2014   Hx of bacterial pneumonia 03/10/2013   COLONIC POLYPS, HX OF 04/08/2010   NEPHROLITHIASIS, HX OF 04/08/2010   ACOUSTIC NEUROMA 04/07/2010   Hypogonadism male 04/07/2010   Depression with anxiety 04/07/2010   SLEEP APNEA, OBSTRUCTIVE 04/07/2010   Asthma 04/07/2010   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   ERECTILE DYSFUNCTION, 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   PLANTAR FASCIITIS 04/07/2010    PCP: [name], MD   REFERRING PROVIDER: Dorna Leitz, MD   REFERRING DIAG: [id number] (ICD-10-CM) - S/P TKR (total knee replacement)   THERAPY DIAG:  Acute pain of left knee  Muscle weakness (generalized)  Stiffness of left knee, not elsewhere classified  Other abnormalities of gait and mobility  Difficulty in walking, not elsewhere classified  Localized edema  RATIONALE FOR EVALUATION AND TREATMENT: Rehabilitation  ONSET DATE: 06/14/22 - L TKA   NEXT MD VISIT: 08/19/22   SUBJECTIVE:                                                                                                                                                                                                         SUBJECTIVE STATEMENT: Pt reports getting sharp pain when trying to straighten his knee, otherwise the knee is doing good.   PAIN: Are you having pain? Yes: NPRS scale: 4-5/10 Pain location: L anterior knee, when turning in bed  Pain description: feels like a knife under kneecap Aggravating factors: bending or straightening knee too quickly, twisting knee Relieving factors: sitting with leg propped up, ice  PERTINENT HISTORY:  R TKA 01/19/19; L knee arthroscopy with medial menisectomy 09/2021; Lupus; L  acoustic neuroma - pt reports minor balance issues related to tumor; asthma; skin cancer; morbid obesity; CKD-III; DVT 2016; anxiety; depression  PRECAUTIONS: None  WEIGHT BEARING RESTRICTIONS: No  FALLS:  Has patient fallen in last 6 months? 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 Wears compression stocking 24 hrs most days at MD recommendation.  MUSCLE LENGTH: Hamstrings: mod tight L>R ITB: WFL Piriformis: NT Hip flexors: mild tight B Quads: mild/mod tight L Heelcord: NT  POSTURE:  weight shift right  PALPATION: Good lateral L patellar mobility  LOWER EXTREMITY ROM:  Active ROM Right eval Left eval Left 07/09/22 Left 07/13/22 Left 07/23/22 supine  Knee flexion 130 108 116 seated 125 supine 125 supine 126  Knee extension 0 17 seated LAQ 5 supine 9 seated LAQ 2 supine 6 seated LAQ 2 supine 1   Passive ROM Right eval Left eval  Knee flexion 131 111  Knee extension 0 3  (Blank rows = not tested)  LOWER EXTREMITY MMT:  MMT Right eval Left eval Right 07/13/22 Left 07/13/22  Hip flexion 4+ 4 4+ 4  Hip extension 4 4 4+ 4  Hip abduction 4- 4- 4 4-  Hip adduction 4 4- 4+ 4  Hip internal rotation      Hip external rotation      Knee flexion '5 4 5 4 '$ *  Knee extension 5 4- * 5 4+   Ankle dorsiflexion 5 4+ 5 4+  Ankle plantarflexion      Ankle inversion      Ankle eversion       (Blank rows = not tested) * = pain in anterior knee  GAIT: Distance walked: 60 ft Assistive device utilized: Single point cane Level of  assistance: Modified independence and SBA Gait pattern: step through pattern, decreased stance time- Left, decreased stride length, decreased hip/knee flexion- Left, and antalgic Comments: decreased gait speed   TODAY'S TREATMENT:   07/23/22 THERAPEUTIC EXERCISE: to improve flexibility, strength and mobility.  Verbal and tactile cues throughout for technique.  Rec bike - L2 x 8 min Knee flexion 25# BLE x 10: 15# LLE x 10 Knee extension 10# BLE x 10: 5# LLE x 10  MANUAL THERAPY: To promote improved flexibility, improved joint mobility, increased ROM, and reduced pain. STM to L distal quads and proximal scar mobilization PROM to L knee in flexion and extension  07/16/22 THERAPEUTIC EXERCISE: to improve flexibility, strength and mobility.  Verbal and tactile cues throughout for technique.  Rec bike - L3 x 6 min L fwd step up 6' x 10  L lateral step up 6' x 10  L HS curl no weight x10 ; 2nd set x 15 2# weight Standing hip abduction 2# weights x 10 bil Standing hip extension 2# weights x 10  bil Standing hip flexion 2# weights x 10 bil Functional squats at counter x 10   MANUAL THERAPY: To promote improved flexibility, improved joint mobility, increased ROM, and reduced pain. L knee - patellar mobs all directions L knee PROM flexion and extension   07/13/22 THERAPEUTIC EXERCISE: to improve flexibility, strength and mobility.  Verbal and tactile cues throughout for technique.  Rec bike - L3 x 6 min Standing RTB L/R 4-way SLR x 10; 2 pole A for balance Seated Fitter leg press (2 black/1 blue) 2 x 10 L fwd 6" step-up x 10 L lateral 6" step-up x 10 L lateral 6" step-down with toe tap to floor x 10, unable to complete heel tap due to limited eccentric control Seated hip ADD isometric + L LAQ with 2# ankle weight 2 x 10 Standing blue TB TKE 2 x 10; UE support on back of chair for balance  THERAPEUTIC ACTIVITIES: L knee ROM & MMT  MANUAL THERAPY: To promote improved flexibility,  improved joint mobility, increased ROM, and reduced pain. L knee - patellar mobs all directions L knee incisional scar massage     PATIENT EDUCATION:  Education details: progress with PT and ongoing PT POC Person educated: Patient Education method: Explanation Education comprehension: verbalized understanding  HOME EXERCISE PROGRAM: Access Code: [email or web address] URL: [email or web address] Date: 07/09/2022 Prepared by: Annie Paras  Exercises - Hooklying Hamstring Stretch with Strap  - 2-3 x daily - 7 x weekly - 3 reps - 30 sec hold - Supine Quadriceps Stretch with Strap on Table  - 2-3 x daily - 7 x weekly - 3 reps - 30 sec hold - Standing Hip Abduction with Counter Support  - 1 x daily - 7 x weekly - 2 sets - 10 reps - 2-3 sec hold - Standing Hip Extension with Counter Support  - 1 x daily - 7 x weekly - 2 sets - 10 reps - 2-3 sec hold - Standing Hip Flexion with Counter Support  - 1 x daily - 7 x weekly - 2 sets - 10 reps - 2-3 sec hold - Standing March with Counter Support  - 1 x daily - 7 x weekly - 2 sets - 10 reps - 2-3 sec hold - Gastroc Stretch on Wall  - 2-3 x daily - 7 x weekly - 3 reps - 30 sec hold   ASSESSMENT:  CLINICAL IMPRESSION: Pt demonstrates improved L knee ROM (1-126 deg). Addressed quad muscle tension with STM and PROM to increase knee ROM. Reviewed gym machines that he could try at gym per pt request. He denied GR post session. Pt showed a good response to treatment and would continue to benefit from skilled PT.  OBJECTIVE IMPAIRMENTS: Abnormal gait, decreased activity tolerance, decreased balance, decreased endurance, decreased knowledge of use of DME, decreased mobility, difficulty walking, decreased ROM, decreased strength, hypomobility, increased edema, increased fascial restrictions, impaired perceived functional ability, increased muscle spasms, impaired flexibility, impaired sensation, improper body mechanics, and pain.   ACTIVITY LIMITATIONS:  bending, standing, squatting, sleeping, stairs, transfers, bed mobility, and locomotion level  PARTICIPATION LIMITATIONS: meal prep, cleaning, laundry, driving, shopping, community activity, occupation, yard work, and working with his show horses  PERSONAL FACTORS: Past/current experiences, Profession, Time since onset of injury/illness/exacerbation, and 3+ comorbidities: R TKA 01/19/19; L knee arthroscopy with medial menisectomy 09/2021; Lupus; L acoustic neuroma; asthma; skin cancer; morbid obesity; CKD-III; DVT 2016; anxiety; depression  are also affecting patient's functional outcome.   REHAB POTENTIAL: Excellent  CLINICAL DECISION MAKING:  Stable/uncomplicated  EVALUATION COMPLEXITY: Low   GOALS: Goals reviewed with patient? Yes  SHORT TERM GOALS: Target date: 07/20/2022   Patient will be independent with initial HEP. Baseline:  Goal status: MET  07/13/22  2.  Patient will demonstrate improved L knee AROM to >/= 2-115 deg to promote normal gait and stair mechanics. Baseline: L knee AROM 5-108 Goal status: MET  07/09/22 - L knee AROM 2-125  LONG TERM GOALS: Target date: 08/10/2022   Patient will be independent with advanced/ongoing HEP to improve outcomes and carryover.  Baseline:  Goal status: IN PROGRESS  2.  Patient will report at least 75% improvement in L knee pain to improve QOL. Baseline:  Goal status: IN PROGRESS  3.  Patient will demonstrate improved L knee AROM to >/= 0-120 deg to allow for normal gait and stair mechanics. Baseline: L knee AROM 5-108 Goal status: IN PROGRESS  07/23/22 - Met for flexion ROM - L knee AROM 1-126  4.  Patient will demonstrate improved B LE strength to >/= 4+/5 for improved stability and ease of mobility. Baseline: Refer to MMT table above Goal status: IN PROGRESS  5.  Patient will be able to ambulate 600' with or w/o LRAD and normal gait pattern without increased pain to access community.  Baseline:  Goal status: IN PROGRESS  6.  Patient will be able to ascend/descend stairs with 1 HR and reciprocal step pattern safely to access home and community.  Baseline:  Goal status: IN PROGRESS  7.  Patient will report >/= 48/80 on LEFS to demonstrate improved functional ability. Baseline: 39 / 80 = 48.8 % Goal status: IN PROGRESS  8.  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 Baseline:  Goal status: IN PROGRESS    PLAN:  PT FREQUENCY: 2x/week  PT DURATION: 4-6 weeks  PLANNED INTERVENTIONS: Therapeutic exercises, Therapeutic activity, Neuromuscular re-education, Balance training, Gait training, Patient/Family education, Self Care, Joint mobilization, Stair training, DME instructions, Dry Needling, Electrical stimulation, Cryotherapy, Moist heat, scar mobilization, Taping, Vasopneumatic device, Ultrasound, Ionotophoresis '4mg'$ /ml Dexamethasone, Manual therapy, and Re-evaluation  PLAN FOR NEXT SESSION: gait training w/o AD to normalize gait pattern; left knee ROM; LE strengthening working towards increased weightbearing tolerance on left LE - update HEP for hip and quad strengthening progression   Artist Pais, PTA 07/23/2022, 11:14 AM

## 2022-07-26 ENCOUNTER — Telehealth: Payer: Self-pay | Admitting: *Deleted

## 2022-07-26 DIAGNOSIS — N138 Other obstructive and reflux uropathy: Secondary | ICD-10-CM | POA: Diagnosis not present

## 2022-07-26 DIAGNOSIS — N401 Enlarged prostate with lower urinary tract symptoms: Secondary | ICD-10-CM | POA: Diagnosis not present

## 2022-07-26 NOTE — Patient Outreach (Signed)
  Care Coordination   07/26/2022 Name: Joseph Tran MRN: [id number] DOB: 1950/09/28   Care Coordination Outreach Attempts:  An unsuccessful telephone outreach was attempted today to offer the patient information about available care coordination services as a benefit of their health plan.   Follow Up Plan:  Additional outreach attempts will be made to offer the patient care coordination information and services.   Encounter Outcome:  No Answer   Care Coordination Interventions:  No, not indicated    Raina Mina, RN Care Management Coordinator [place] [facility] [phone]

## 2022-07-27 ENCOUNTER — Ambulatory Visit: Payer: Medicare Other | Admitting: Physical Therapy

## 2022-07-27 ENCOUNTER — Encounter: Payer: Self-pay | Admitting: Physical Therapy

## 2022-07-27 DIAGNOSIS — M25562 Pain in left knee: Secondary | ICD-10-CM

## 2022-07-27 DIAGNOSIS — R2689 Other abnormalities of gait and mobility: Secondary | ICD-10-CM

## 2022-07-27 DIAGNOSIS — M25662 Stiffness of left knee, not elsewhere classified: Secondary | ICD-10-CM | POA: Diagnosis not present

## 2022-07-27 DIAGNOSIS — R600 Localized edema: Secondary | ICD-10-CM

## 2022-07-27 DIAGNOSIS — R262 Difficulty in walking, not elsewhere classified: Secondary | ICD-10-CM

## 2022-07-27 DIAGNOSIS — M6281 Muscle weakness (generalized): Secondary | ICD-10-CM

## 2022-07-27 NOTE — Therapy (Signed)
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 [name] at [facility], left kidney is non=functioning.  right kidny is at 50%   Clotting disorder ([place])    Hx DVT - Xarelto   Colon polyps    Complication of anesthesia    Post op nausea/vomiting   Depression    severe.  dx 1985   DVT (deep venous thrombosis) ([place])    sees Dr. [name]    History of kidney stones    has kidney stone now.     Hypertension    Hypogonadism male    sees Dr. Baruch Gouty at [facility]   Neuropathy of both feet    OSA (obstructive sleep apnea)    Plantar fasciitis    rt foot   Pneumonia    PONV (postoperative nausea and vomiting)    Renal atrophy, left    sees Dr. Baruch Gouty at [facility]   Sleep apnea    tested 2010  - wears c-pap   Past Surgical History:  Procedure Laterality Date   CHEST WALL TUMOR EXCISION  2007   COLONOSCOPY  04/07/2020   per Dr. Hilarie Fredrickson, adenomatous polyps, repeat in 3 yrs    kidney caluculs  2004-05   KIDNEY STONE SURGERY     x 2c   KNEE ARTHROSCOPY  09/10/2011   right knee, per Dr. Dorna Leitz    KNEE ARTHROSCOPY Right 04/25/2017   Procedure: RIGHT KNEE ARTHROSCOPY,  PARTIAL LATERAL MENISECTOMY, CHONDROPLASTY MEDIAL AND LATERAL PATELLAOFEMORAL;  Surgeon: Dorna Leitz, MD;  Location: [facility];  Service: Orthopedics;  Laterality: Right;   KNEE ARTHROSCOPY Left 09/18/2021   Procedure: ARTHROSCOPY KNEE;  Surgeon: Dorna Leitz, MD;  Location: [facility];  Service: Orthopedics;  Laterality: Left;   KNEE ARTHROSCOPY WITH MEDIAL MENISECTOMY Left 09/18/2021   Procedure: KNEE ARTHROSCOPY WITH MEDIAL MENISECTOMY;  Surgeon: Dorna Leitz, MD;  Location: [facility];  Service: Orthopedics;  Laterality: Left;   madiscus cartilage  2009   MOHS SURGERY     right knee arthroscopy  2008   TONSILLECTOMY     TOTAL KNEE ARTHROPLASTY Right 01/19/2019   Procedure: RIGHT TOTAL KNEE ARTHROPLASTY;  Surgeon: Dorna Leitz, MD;  Location: [facility];  Service: Orthopedics;  Laterality: Right;   TOTAL KNEE ARTHROPLASTY Left 06/14/2022   Procedure: TOTAL KNEE ARTHROPLASTY;  Surgeon: Dorna Leitz, MD;  Location: [facility];  Service: Orthopedics;  Laterality: Left;  UPPER GASTROINTESTINAL ENDOSCOPY     Patient Active Problem List   Diagnosis Date Noted   Vitamin B 12 deficiency 07/14/2022   Preoperative respiratory examination 04/26/2022   Morbid obesity ([place]) 03/26/2022   Primary osteoarthritis of left knee 16/04/9603   [place] of knee, left 54/03/8118   Complex tear of lat mensc, current injury, left knee, init 09/18/2021   Acute meniscal tear, medial, left, initial encounter 09/18/2021   Hyperglycemia 05/30/2019   Primary osteoarthritis of right knee 01/19/2019   CKD (chronic kidney disease), stage III ([place]) 09/13/2017   Renal atrophy, left 09/13/2017   Systemic lupus erythematosus ([place]) 09/13/2017   Dyslipidemia 09/13/2017   Acute meniscal tear, lateral, right, initial encounter 04/25/2017   Osteoarthritis of right knee 04/25/2017   Concussion with loss of consciousness 08/05/2015   Laceration of spleen 08/05/2015   Lumbar stress fracture 08/05/2015   DVT (deep venous thrombosis) ([place])  10/18/2014   Hx of bacterial pneumonia 03/10/2013   COLONIC POLYPS, HX OF 04/08/2010   NEPHROLITHIASIS, HX OF 04/08/2010   ACOUSTIC NEUROMA 04/07/2010   Hypogonadism male 04/07/2010   Depression with anxiety 04/07/2010   SLEEP APNEA, OBSTRUCTIVE 04/07/2010   Asthma 04/07/2010   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   ERECTILE DYSFUNCTION, 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   PLANTAR FASCIITIS 04/07/2010    PCP: [name], MD   REFERRING PROVIDER: Dorna Leitz, MD   REFERRING DIAG: [phone] (ICD-10-CM) - S/P TKR (total knee replacement)   THERAPY DIAG:  Acute pain of left knee  Muscle weakness (generalized)  Stiffness of left knee, not elsewhere classified  Other abnormalities of gait and mobility  Difficulty in walking, not elsewhere classified  Localized edema  RATIONALE FOR EVALUATION AND TREATMENT: Rehabilitation  ONSET DATE: 06/14/22 - L TKA   NEXT MD VISIT: 08/19/22   SUBJECTIVE:                                                                                                                                                                                                         SUBJECTIVE STATEMENT:  Went to MD last week due to swelling outside of knee and was told he had blood accumulated around the knee and should resolve in the next couple of weeks. Two days ago L knee started itching and part of incision opened up and bled some. Itching has now gone away but will notify doctor if it happens again.   PAIN: Are you having pain? Yes: NPRS scale: 0 RN /10 Pain location: L anterior knee, when turning in bed  Pain description: feels like  a knife under kneecap Aggravating factors: bending or straightening knee too quickly, twisting knee Relieving factors: sitting with leg propped up, ice  PERTINENT HISTORY:  R TKA 01/19/19; L knee arthroscopy with medial menisectomy 09/2021; Lupus; L acoustic neuroma - pt reports minor balance issues related to tumor; asthma; skin cancer;  morbid obesity; CKD-III; DVT 2016; anxiety; depression  PRECAUTIONS: None  WEIGHT BEARING RESTRICTIONS: No  FALLS:  Has patient fallen in last 6 months? 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 Wears compression stocking 24 hrs most days at MD recommendation.  MUSCLE LENGTH: Hamstrings: mod tight L>R ITB: WFL Piriformis: NT Hip flexors: mild tight B Quads: mild/mod tight L Heelcord: NT  POSTURE:  weight shift right  PALPATION: Good lateral L patellar mobility  LOWER EXTREMITY ROM:  Active ROM Right eval Left eval Left 07/09/22 Left 07/13/22 Left 07/23/22 supine  Knee flexion 130 108 116 seated 125 supine 125 supine 126  Knee extension 0 17 seated LAQ 5 supine 9 seated LAQ 2 supine 6 seated LAQ 2 supine 1   Passive ROM Right eval Left eval  Knee flexion 131 111  Knee extension 0 3  (Blank rows = not tested)  LOWER EXTREMITY MMT:  MMT Right eval Left eval Right 07/13/22 Left 07/13/22  Hip flexion 4+ 4 4+ 4  Hip extension 4 4 4+ 4  Hip abduction 4- 4- 4 4-  Hip adduction 4 4- 4+ 4  Hip internal rotation      Hip external rotation      Knee flexion '5 4 5 4 '$ *  Knee extension 5 4- * 5 4+   Ankle dorsiflexion 5 4+ 5 4+  Ankle plantarflexion       Ankle inversion      Ankle eversion       (Blank rows = not tested) * = pain in anterior knee  GAIT: Distance walked: 60 ft Assistive device utilized: Single point cane Level of assistance: Modified independence and SBA Gait pattern: step through pattern, decreased stance time- Left, decreased stride length, decreased hip/knee flexion- Left, and antalgic Comments: decreased gait speed   TODAY'S TREATMENT:   07/27/22 THERAPEUTIC EXERCISE: to improve flexibility, strength and mobility.  Verbal and tactile cues throughout for technique.  Rec bike - L3 x 8 min Knee flexion 20# BLE 2x 10: 15# LLE 2x 10  Knee extension 10# BLE 2x 10: 5# LLE 2x 10 L fwd 6" step-up 2x 10 w/ contralateral knee up coming up Hip Hiker 6'; heel taps x10, increased difficulty but was able to complete 3 [employer] knock-down & righting bilat x2 each (pole for support)  MANUAL THERAPY: To promote improved flexibility, improved joint mobility, increased ROM, and reduced pain. STM to L quads and proximal scar mobilization   07/23/22  THERAPEUTIC EXERCISE: to improve flexibility, strength and mobility.  Verbal and tactile cues throughout for technique.  Rec bike - L2 x 8 min Knee flexion 25# BLE x 10: 15# LLE x 10 Knee extension 10# BLE x 10: 5# LLE x 10  MANUAL THERAPY: To promote improved flexibility, improved joint mobility, increased ROM, and reduced pain. STM to L distal quads and proximal scar mobilization PROM to L knee in flexion and extension   07/16/22 THERAPEUTIC EXERCISE: to improve flexibility, strength and mobility.  Verbal and tactile cues throughout for technique.  Rec bike - L3 x 6 min L fwd step up 6' x 10  L lateral step up 6' x 10  L HS curl no weight x10 ; 2nd set x 15 2# weight Standing hip abduction 2# weights x 10 bil Standing hip extension 2# weights x 10 bil Standing hip flexion 2# weights x 10 bil Functional squats at counter x 10   MANUAL THERAPY: To promote improved  flexibility, improved joint mobility, increased ROM, and reduced pain. L knee - patellar mobs all directions L knee PROM flexion and extension   PATIENT EDUCATION:  Education details: progress with PT and ongoing PT POC Person educated: Patient Education method: Explanation Education comprehension: verbalized understanding  HOME EXERCISE PROGRAM: Access Code: [email or web address] URL: [email or web address] Date: 07/09/2022 Prepared by: Annie Paras  Exercises - Hooklying Hamstring Stretch with Strap  - 2-3 x daily - 7 x weekly - 3 reps - 30 sec hold - Supine Quadriceps Stretch with Strap on Table  - 2-3 x daily - 7 x weekly - 3 reps - 30 sec hold - Standing Hip Abduction with Counter Support  - 1 x daily - 7 x weekly - 2 sets - 10 reps - 2-3 sec hold - Standing Hip Extension with Counter Support  - 1 x daily - 7 x weekly - 2 sets - 10 reps - 2-3 sec hold - Standing Hip Flexion with Counter Support  - 1 x daily - 7 x weekly - 2 sets - 10 reps - 2-3 sec hold - Standing March with Counter Support  - 1 x daily - 7 x weekly - 2 sets - 10 reps - 2-3 sec hold - Gastroc Stretch on Wall  - 2-3 x daily - 7 x weekly - 3 reps - 30 sec hold   ASSESSMENT:  CLINICAL IMPRESSION:    Pt came into today's session with no pain or discomfort. Pt stated he had a little bit of his incision wound open up after rubbing it. Site closed up but pt was educated on infection signs and was told to contact doctor if any were to arise. Pt stated that at the MD visit last week, he was told that he had blood accumulated around the L knee and that this would likely resolve in the coming weeks. Pt continued to do well with the strengthening exercises and today was introduced to new exercises that both incorporate strengthening and balancing. Pt had some issues with the balancing exercises but was able to complete with min guarding from PT. Overall, pt is progressing according to POC and will continue to benefit from  skilled PT interventions.   OBJECTIVE IMPAIRMENTS: Abnormal gait, decreased activity tolerance, decreased balance, decreased endurance, decreased knowledge of use of DME, decreased mobility, difficulty walking, decreased ROM, decreased strength, hypomobility, increased edema, increased fascial restrictions, impaired perceived functional ability, increased muscle spasms, impaired flexibility, impaired sensation, improper body mechanics, and pain.   ACTIVITY LIMITATIONS:  bending, standing, squatting, sleeping, stairs, transfers, bed mobility, and locomotion level  PARTICIPATION LIMITATIONS: meal prep, cleaning, laundry, driving, shopping, community activity, occupation, yard work, and working with his show horses  PERSONAL FACTORS: Past/current experiences, Profession, Time since onset of injury/illness/exacerbation, and 3+ comorbidities: R TKA 01/19/19; L knee arthroscopy with medial menisectomy 09/2021; Lupus; L acoustic neuroma; asthma; skin cancer; morbid obesity; CKD-III; DVT 2016; anxiety; depression  are also affecting patient's functional outcome.   REHAB POTENTIAL: Excellent  CLINICAL DECISION MAKING: Stable/uncomplicated  EVALUATION COMPLEXITY: Low   GOALS: Goals reviewed with patient? Yes  SHORT TERM GOALS: Target date: 07/20/2022   Patient will be independent with initial HEP. Baseline:  Goal status: MET  07/13/22  2.  Patient will demonstrate improved L knee AROM to >/= 2-115 deg to promote normal gait and stair mechanics. Baseline: L knee AROM 5-108 Goal status: MET  07/09/22 - L knee AROM 2-125  LONG TERM GOALS: Target date: 08/10/2022   Patient will be independent with advanced/ongoing HEP to improve outcomes and carryover.  Baseline:  Goal status: IN PROGRESS  2.  Patient will report at least 75% improvement in L knee pain to improve QOL. Baseline:  Goal status: IN PROGRESS  3.  Patient will demonstrate improved L knee AROM to >/= 0-120 deg to allow for normal gait and  stair mechanics. Baseline: L knee AROM 5-108 Goal status: IN PROGRESS  07/23/22 - Met for flexion ROM - L knee AROM 1-126  4.  Patient will demonstrate improved B LE strength to >/= 4+/5 for improved stability and ease of mobility. Baseline: Refer to MMT table above Goal status: IN PROGRESS  5.  Patient will be able to ambulate 600' with or w/o LRAD and normal gait pattern without increased pain to access community.  Baseline:  Goal status: IN PROGRESS  6. Patient will be able to ascend/descend stairs with 1 HR and reciprocal step pattern safely to access home and community.  Baseline:  Goal status: IN PROGRESS  7.  Patient will report >/= 48/80 on LEFS to demonstrate improved functional ability. Baseline: 39 / 80 = 48.8 % Goal status: IN PROGRESS  8.  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 Baseline:  Goal status: IN PROGRESS    PLAN:  PT FREQUENCY: 2x/week  PT DURATION: 4-6 weeks  PLANNED INTERVENTIONS: Therapeutic exercises, Therapeutic activity, Neuromuscular re-education, Balance training, Gait training, Patient/Family education, Self Care, Joint mobilization, Stair training, DME instructions, Dry Needling, Electrical stimulation, Cryotherapy, Moist heat, scar mobilization, Taping, Vasopneumatic device, Ultrasound, Ionotophoresis '4mg'$ /ml Dexamethasone, Manual therapy, and Re-evaluation  PLAN FOR NEXT SESSION: Check in to see how incision is doing & check for any signs of infection. Continue incorporating LE strengthening exercises and balance training as appropriate. Update HEP for hip and quad strengthening progression   Petro Talent Romero-Perozo, Student-PT 07/27/2022, 11:22 AM

## 2022-07-30 ENCOUNTER — Ambulatory Visit: Payer: Medicare Other

## 2022-07-30 DIAGNOSIS — R600 Localized edema: Secondary | ICD-10-CM

## 2022-07-30 DIAGNOSIS — R2689 Other abnormalities of gait and mobility: Secondary | ICD-10-CM

## 2022-07-30 DIAGNOSIS — R262 Difficulty in walking, not elsewhere classified: Secondary | ICD-10-CM | POA: Diagnosis not present

## 2022-07-30 DIAGNOSIS — M25662 Stiffness of left knee, not elsewhere classified: Secondary | ICD-10-CM | POA: Diagnosis not present

## 2022-07-30 DIAGNOSIS — M6281 Muscle weakness (generalized): Secondary | ICD-10-CM

## 2022-07-30 DIAGNOSIS — M25562 Pain in left knee: Secondary | ICD-10-CM | POA: Diagnosis not present

## 2022-07-30 NOTE — Therapy (Signed)
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 [name] at [facility], left kidney is non=functioning.  right kidny is at 50%   Clotting disorder ([place])    Hx DVT - Xarelto   Colon polyps    Complication of anesthesia    Post op nausea/vomiting   Depression    severe.  dx 1985   DVT (deep venous thrombosis) ([place])    sees Dr. [name]    History of kidney stones    has kidney stone now.     Hypertension    Hypogonadism male    sees Dr. Baruch Gouty at [facility]   Neuropathy of both feet    OSA (obstructive sleep apnea)    Plantar fasciitis    rt foot   Pneumonia    PONV (postoperative nausea and vomiting)    Renal atrophy, left    se[facility] Dr. Baruch Gouty at [facility]   Sleep apnea    tested 2010  - wears c-pap   Past Surgical History:  Procedure Laterality Date   CHEST WALL TUMOR EXCISION  2007   COLONOSCOPY  04/07/2020   per Dr. Hilarie Fredrickson, adenomatous polyps, repeat in 3 yrs    kidney caluculs  2004-05   KIDNEY STONE SURGERY     x 2c   KNEE ARTHROSCOPY  09/10/2011   right knee, per Dr. Dorna Leitz    KNEE ARTHROSCOPY Right 04/25/2017   Procedure: RIGHT KNEE ARTHROSCOPY,  PARTIAL LATERAL MENISECTOMY, CHONDROPLASTY MEDIAL AND LATERAL PATELLAOFEMORAL;  Surgeon: Dorna Leitz, MD;  Location: [place];  Service: Orthopedics;  Laterality: Right;   KNEE ARTHROSCOPY Left 09/18/2021   Procedure: ARTHROSCOPY KNEE;  Surgeon: Dorna Leitz, MD;  Location: [facility];  Service: Orthopedics;  Laterality: Left;   KNEE ARTHROSCOPY WITH MEDIAL MENISECTOMY Left 09/18/2021   Procedure: KNEE ARTHROSCOPY WITH MEDIAL MENISECTOMY;  Surgeon: Dorna Leitz, MD;  Location: [facility];  Service: Orthopedics;  Laterality: Left;   madiscus cartilage  2009   MOHS SURGERY     right knee arthroscopy  2008   TONSILLECTOMY     TOTAL KNEE ARTHROPLASTY Right 01/19/2019   Procedure: RIGHT TOTAL KNEE ARTHROPLASTY;  Surgeon: Dorna Leitz, MD;  Location: [facility];  Service: Orthopedics;  Laterality: Right;   TOTAL KNEE ARTHROPLASTY Left 06/14/2022   Procedure: TOTAL KNEE ARTHROPLASTY;  Surgeon: Dorna Leitz, MD;  Location: [facility];  Service: Orthopedics;  Laterality: Left;  UPPER GASTROINTESTINAL ENDOSCOPY     Patient Active Problem List   Diagnosis Date Noted   Vitamin B 12 deficiency 07/14/2022   Preoperative respiratory examination 04/26/2022   Morbid obesity ([place]) 03/26/2022   Primary osteoarthritis of left knee 34/19/6222   [place] of knee, left 97/98/9211   Complex tear of lat mensc, current injury, left knee, init 09/18/2021   Acute meniscal tear, medial, left, initial encounter 09/18/2021   Hyperglycemia 05/30/2019   Primary osteoarthritis of right knee 01/19/2019   CKD (chronic kidney disease), stage III ([place]) 09/13/2017   Renal atrophy, left 09/13/2017   Systemic lupus erythematosus ([place]) 09/13/2017   Dyslipidemia 09/13/2017   Acute meniscal tear, lateral, right, initial encounter 04/25/2017   Osteoarthritis of right knee 04/25/2017   Concussion with loss of consciousness 08/05/2015   Laceration of spleen 08/05/2015   Lumbar stress fracture 08/05/2015   DVT (deep venous thrombosis) ([place])  10/18/2014   Hx of bacterial pneumonia 03/10/2013   COLONIC POLYPS, HX OF 04/08/2010   NEPHROLITHIASIS, HX OF 04/08/2010   ACOUSTIC NEUROMA 04/07/2010   Hypogonadism male 04/07/2010   Depression with anxiety 04/07/2010   SLEEP APNEA, OBSTRUCTIVE 04/07/2010   Asthma 04/07/2010   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   ERECTILE DYSFUNCTION, 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   PLANTAR FASCIITIS 04/07/2010    PCP: [name], MD   REFERRING PROVIDER: Dorna Leitz, MD   REFERRING DIAG: [phone] (ICD-10-CM) - S/P TKR (total knee replacement)   THERAPY DIAG:  Acute pain of left knee  Muscle weakness (generalized)  Stiffness of left knee, not elsewhere classified  Other abnormalities of gait and mobility  Difficulty in walking, not elsewhere classified  Localized edema  RATIONALE FOR EVALUATION AND TREATMENT: Rehabilitation  ONSET DATE: 06/14/22 - L TKA   NEXT MD VISIT: 08/19/22   SUBJECTIVE:                                                                                                                                                                                                         SUBJECTIVE STATEMENT: No pain today just discomfort along patella. The L knee isn't itching as much now, he rubbed one area then blood came out and ever since he noticed less itching.   PAIN: Are you having pain? No - discomfort along patella   PERTINENT HISTORY:  R TKA 01/19/19; L knee arthroscopy with medial menisectomy 09/2021; Lupus; L acoustic neuroma - pt reports minor balance issues related to tumor; asthma; skin cancer; morbid obesity; CKD-III; DVT 2016; anxiety; depression  PRECAUTIONS: None  WEIGHT BEARING RESTRICTIONS:  No  FALLS:  Has patient fallen in last 6 months? 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 Wears compression stocking 24 hrs most days at MD recommendation.  MUSCLE LENGTH: Hamstrings: mod tight L>R ITB: WFL Piriformis: NT Hip flexors: mild tight B Quads: mild/mod tight L Heelcord: NT  POSTURE:  weight shift right  PALPATION: Good lateral L patellar mobility  LOWER EXTREMITY ROM:  Active ROM Right eval Left eval Left 07/09/22 Left 07/13/22 Left 07/23/22 supine  Knee flexion 130 108 116 seated 125 supine 125 supine 126  Knee extension 0 17 seated LAQ 5 supine 9 seated LAQ 2 supine 6 seated LAQ 2 supine 1   Passive ROM Right eval Left eval  Knee flexion 131 111  Knee extension 0 3  (Blank rows = not tested)  LOWER EXTREMITY MMT:  MMT Right eval Left eval Right 07/13/22 Left 07/13/22  Hip flexion 4+ 4 4+ 4  Hip extension 4 4 4+ 4  Hip abduction 4- 4- 4 4-  Hip adduction 4 4- 4+ 4  Hip internal rotation      Hip external rotation      Knee flexion '5 4 5 4 '$ *  Knee extension 5 4- * 5 4+   Ankle dorsiflexion 5 4+ 5 4+  Ankle plantarflexion      Ankle inversion      Ankle eversion       (Blank rows = not tested) * = pain in anterior knee  GAIT: Distance walked: 60 ft Assistive device utilized: Single point cane Level of assistance: Modified independence and SBA Gait pattern: step through pattern, decreased stance time- Left, decreased stride length, decreased hip/knee flexion- Left, and  antalgic Comments: decreased gait speed   TODAY'S TREATMENT:    07/30/22 THERAPEUTIC EXERCISE: to improve flexibility, strength and mobility.  Verbal and tactile cues throughout for technique.  Rec bike - L3 x 8 min Seated heel slide 10x3" on slider with strap L lateral step up 6' 2x10 with one HA  Knee flexion 25# 2x10BLE; 15# x 10 LLE Knee extension 15# 2x10 BLE; 5# x 10 LLE Golfers lifts 2x10 R/L one HA  07/27/22 THERAPEUTIC EXERCISE: to improve flexibility, strength and mobility.  Verbal and tactile cues throughout for technique.  Rec bike - L3 x 8 min Knee flexion 20# BLE 2x 10: 15# LLE 2x 10  Knee extension 10# BLE 2x 10: 5# LLE 2x 10 L fwd 6" step-up 2x 10 w/ contralateral knee up coming up Hip Hiker 6'; heel taps x10, increased difficulty but was able to complete 3 [employer] knock-down & righting bilat x2 each (pole for support)  MANUAL THERAPY: To promote improved flexibility, improved joint mobility, increased ROM, and reduced pain. STM to L quads and proximal scar mobilization  07/23/22 THERAPEUTIC EXERCISE: to improve flexibility, strength and mobility.  Verbal and tactile cues throughout for technique.  Rec bike - L2 x 8 min Knee flexion 25# BLE x 10: 15# LLE x 10 Knee extension 10# BLE x 10: 5# LLE x 10  MANUAL THERAPY: To promote improved flexibility, improved joint mobility, increased ROM, and reduced pain. STM to L distal quads and proximal scar mobilization PROM to L knee in flexion and extension   07/16/22 THERAPEUTIC EXERCISE: to improve flexibility, strength and mobility.  Verbal and tactile cues throughout for technique.  Rec bike - L3 x 6 min L fwd step up 6' x 10  L lateral step up 6' x 10  L HS curl no weight x10 ; 2nd set x 15 2# weight Standing hip abduction 2# weights x 10 bil Standing hip extension 2# weights x 10 bil Standing hip flexion 2# weights x 10 bil Functional squats at counter x 10   MANUAL THERAPY: To promote improved flexibility,  improved joint mobility, increased ROM, and reduced pain. L knee - patellar mobs all directions L knee PROM flexion and extension   PATIENT EDUCATION:  Education details: progress with PT and ongoing PT POC Person educated: Patient Education method: Explanation Education comprehension: verbalized understanding  HOME EXERCISE PROGRAM: Access Code: [email or web address] URL: [email or web address] Date: 07/09/2022 Prepared by: Annie Paras  Exercises - Hooklying Hamstring Stretch with Strap  - 2-3 x daily - 7 x weekly - 3 reps - 30 sec hold - Supine Quadriceps Stretch with Strap on Table  - 2-3 x daily - 7 x weekly - 3 reps - 30 sec hold - Standing Hip Abduction with Counter Support  - 1 x daily - 7 x weekly - 2 sets - 10 reps - 2-3 sec hold - Standing Hip Extension with Counter Support  - 1 x daily - 7 x weekly - 2 sets - 10 reps - 2-3 sec hold - Standing Hip Flexion with Counter Support  - 1 x daily - 7 x weekly - 2 sets - 10 reps - 2-3 sec hold - Standing March with Counter Support  - 1 x daily - 7 x weekly - 2 sets - 10 reps - 2-3 sec hold - Gastroc Stretch on Wall  - 2-3 x daily - 7 x weekly - 3 reps - 30 sec hold   ASSESSMENT:  CLINICAL IMPRESSION:    Ron reported improved itching in L knee with good looking scar indicating no infection or anything to reverse healing. Progressed TE to improve knee strength in [place] and CKC to improve function. Cues required with golfers lift to keep knee slightly bent to avoid genu recurvatum. He is now able to brush his horses but still avoids doing more strenuous activities.  OBJECTIVE IMPAIRMENTS: Abnormal gait, decreased activity tolerance, decreased balance, decreased endurance, decreased knowledge of use of DME, decreased mobility, difficulty walking, decreased ROM, decreased strength, hypomobility, increased edema, increased fascial restrictions, impaired perceived functional ability, increased muscle spasms, impaired flexibility, impaired  sensation, improper body mechanics, and pain.   ACTIVITY LIMITATIONS: bending, standing, squatting, sleeping, stairs, transfers, bed mobility, and locomotion level  PARTICIPATION LIMITATIONS: meal prep, cleaning, laundry, driving, shopping, community activity, occupation, yard work, and working with his show horses  PERSONAL FACTORS: Past/current experiences, Profession, Time since onset of injury/illness/exacerbation, and 3+ comorbidities: R TKA 01/19/19; L knee arthroscopy with medial menisectomy 09/2021; Lupus; L acoustic neuroma; asthma; skin cancer; morbid obesity; CKD-III; DVT 2016; anxiety; depression  are also affecting  patient's functional outcome.   REHAB POTENTIAL: Excellent  CLINICAL DECISION MAKING: Stable/uncomplicated  EVALUATION COMPLEXITY: Low   GOALS: Goals reviewed with patient? Yes  SHORT TERM GOALS: Target date: 07/20/2022   Patient will be independent with initial HEP. Baseline:  Goal status: MET  07/13/22  2.  Patient will demonstrate improved L knee AROM to >/= 2-115 deg to promote normal gait and stair mechanics. Baseline: L knee AROM 5-108 Goal status: MET  07/09/22 - L knee AROM 2-125  LONG TERM GOALS: Target date: 08/10/2022   Patient will be independent with advanced/ongoing HEP to improve outcomes and carryover.  Baseline:  Goal status: IN PROGRESS  2.  Patient will report at least 75% improvement in L knee pain to improve QOL. Baseline:  Goal status: IN PROGRESS  3.  Patient will demonstrate improved L knee AROM to >/= 0-120 deg to allow for normal gait and stair mechanics. Baseline: L knee AROM 5-108 Goal status: IN PROGRESS  07/23/22 - Met for flexion ROM - L knee AROM 1-126  4.  Patient will demonstrate improved B LE strength to >/= 4+/5 for improved stability and ease of mobility. Baseline: Refer to MMT table above Goal status: IN PROGRESS  5.  Patient will be able to ambulate 600' with or w/o LRAD and normal gait pattern without increased  pain to access community.  Baseline:  Goal status: IN PROGRESS  6. Patient will be able to ascend/descend stairs with 1 HR and reciprocal step pattern safely to access home and community.  Baseline:  Goal status: IN PROGRESS  7.  Patient will report >/= 48/80 on LEFS to demonstrate improved functional ability. Baseline: 39 / 80 = 48.8 % Goal status: IN PROGRESS  8.  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 Baseline:  Goal status: IN PROGRESS - 07/30/22   PLAN:  PT FREQUENCY: 2x/week  PT DURATION: 4-6 weeks  PLANNED INTERVENTIONS: Therapeutic exercises, Therapeutic activity, Neuromuscular re-education, Balance training, Gait training, Patient/Family education, Self Care, Joint mobilization, Stair training, DME instructions, Dry Needling, Electrical stimulation, Cryotherapy, Moist heat, scar mobilization, Taping, Vasopneumatic device, Ultrasound, Ionotophoresis '4mg'$ /ml Dexamethasone, Manual therapy, and Re-evaluation  PLAN FOR NEXT SESSION: Continue incorporating LE strengthening exercises and balance training as appropriate. Update HEP for hip and quad strengthening progression   Artist Pais, PTA 07/30/2022, 12:03 PM

## 2022-08-02 DIAGNOSIS — N138 Other obstructive and reflux uropathy: Secondary | ICD-10-CM | POA: Diagnosis not present

## 2022-08-02 DIAGNOSIS — Z87448 Personal history of other diseases of urinary system: Secondary | ICD-10-CM | POA: Diagnosis not present

## 2022-08-02 DIAGNOSIS — N529 Male erectile dysfunction, unspecified: Secondary | ICD-10-CM | POA: Diagnosis not present

## 2022-08-02 DIAGNOSIS — Z125 Encounter for screening for malignant neoplasm of prostate: Secondary | ICD-10-CM | POA: Diagnosis not present

## 2022-08-02 DIAGNOSIS — N401 Enlarged prostate with lower urinary tract symptoms: Secondary | ICD-10-CM | POA: Diagnosis not present

## 2022-08-02 DIAGNOSIS — N1832 Chronic kidney disease, stage 3b: Secondary | ICD-10-CM | POA: Diagnosis not present

## 2022-08-03 ENCOUNTER — Encounter: Payer: Medicare Other | Admitting: Physical Therapy

## 2022-08-03 NOTE — Therapy (Signed)
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 [name] at [facility], left kidney is non=functioning.  right kidny is at 50%   Clotting disorder ([place])    Hx DVT - Xarelto   Colon polyps    Complication of anesthesia    Post op nausea/vomiting   Depression    severe.  dx 1985   DVT (deep venous thrombosis) ([place])    sees Dr. [name]    History of kidney stones    has kidney stone now.     Hypertension    Hypogonadism male    sees Dr. Baruch Gouty at [facility]   Neuropathy of both feet    OSA (obstructive sleep apnea)    Plantar fasciitis    rt foot   Pneumonia    PONV (postoperative nausea and vomiting)    Renal atrophy, left    sees Dr. Baruch Gouty at [facility]   Sleep apnea    tested 2010  - wears c-pap   Past Surgical History:  Procedure Laterality Date   CHEST WALL TUMOR EXCISION  2007   COLONOSCOPY  04/07/2020   per Dr. Hilarie Fredrickson, adenomatous polyps, repeat in 3 yrs    kidney caluculs  2004-05   KIDNEY STONE SURGERY     x 2c   KNEE ARTHROSCOPY  09/10/2011   right knee, per Dr. Dorna Leitz    KNEE ARTHROSCOPY Right 04/25/2017   Procedure: RIGHT KNEE ARTHROSCOPY,  PARTIAL LATERAL MENISECTOMY, CHONDROPLASTY MEDIAL AND LATERAL PATELLAOFEMORAL;  Surgeon: Dorna Leitz, MD;  Location: [facility];  Service: Orthopedics;  Laterality: Right;   KNEE ARTHROSCOPY Left 09/18/2021   Procedure: ARTHROSCOPY KNEE;  Surgeon: Dorna Leitz, MD;  Location: [facility];  Service: Orthopedics;  Laterality: Left;   KNEE ARTHROSCOPY WITH MEDIAL MENISECTOMY Left 09/18/2021   Procedure: KNEE ARTHROSCOPY WITH MEDIAL MENISECTOMY;  Surgeon: Dorna Leitz, MD;  Location: [facility];  Service: Orthopedics;  Laterality: Left;   madiscus cartilage  2009   MOHS SURGERY     right knee arthroscopy  2008   TONSILLECTOMY     TOTAL KNEE ARTHROPLASTY Right 01/19/2019   Procedure: RIGHT TOTAL KNEE ARTHROPLASTY;  Surgeon: Dorna Leitz, MD;  Location: [facility];  Service: Orthopedics;  Laterality: Right;   TOTAL KNEE ARTHROPLASTY Left 06/14/2022   Procedure: TOTAL KNEE ARTHROPLASTY;  Surgeon: Dorna Leitz, MD;  Location: [facility];  Service: Orthopedics;  Laterality: Left;  UPPER GASTROINTESTINAL ENDOSCOPY     Patient Active Problem List   Diagnosis Date Noted   Vitamin B 12 deficiency 07/14/2022   Preoperative respiratory examination 04/26/2022   Morbid obesity ([place]) 03/26/2022   Primary osteoarthritis of left knee 36/64/4034   [place] of knee, left 74/25/9563   Complex tear of lat mensc, current injury, left knee, init 09/18/2021   Acute meniscal tear, medial, left, initial encounter 09/18/2021   Hyperglycemia 05/30/2019   Primary osteoarthritis of right knee 01/19/2019   CKD (chronic kidney disease), stage III ([place]) 09/13/2017   Renal atrophy, left 09/13/2017   Systemic lupus erythematosus ([place]) 09/13/2017   Dyslipidemia 09/13/2017   Acute meniscal tear, lateral, right, initial encounter 04/25/2017   Osteoarthritis of right knee 04/25/2017   Concussion with loss of consciousness 08/05/2015   Laceration of spleen 08/05/2015   Lumbar stress fracture 08/05/2015   DVT (deep venous thrombosis) ([place])  10/18/2014   Hx of bacterial pneumonia 03/10/2013   COLONIC POLYPS, HX OF 04/08/2010   NEPHROLITHIASIS, HX OF 04/08/2010   ACOUSTIC NEUROMA 04/07/2010   Hypogonadism male 04/07/2010   Depression with anxiety 04/07/2010   SLEEP APNEA, OBSTRUCTIVE 04/07/2010   Asthma 04/07/2010   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   ERECTILE DYSFUNCTION, 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   PLANTAR FASCIITIS 04/07/2010    PCP: [name], MD   REFERRING PROVIDER: Dorna Leitz, MD   REFERRING DIAG: [phone] (ICD-10-CM) - S/P TKR (total knee replacement)   THERAPY DIAG:  Acute pain of left knee  Muscle weakness (generalized)  Stiffness of left knee, not elsewhere classified  Other abnormalities of gait and mobility  Difficulty in walking, not elsewhere classified  Localized edema  RATIONALE FOR EVALUATION AND TREATMENT: Rehabilitation  ONSET DATE: 06/14/22 - L TKA   NEXT MD VISIT: 08/19/22   SUBJECTIVE:                                                                                                                                                                                                         SUBJECTIVE STATEMENT: Reports increased swelling since yesterday. He stayed in recliner most of the day.   PAIN: Are you having pain? No - discomfort along patella   PERTINENT HISTORY:  R TKA 01/19/19; L knee arthroscopy with medial menisectomy 09/2021; Lupus; L acoustic neuroma - pt reports minor balance issues related to tumor; asthma; skin cancer; morbid obesity; CKD-III; DVT 2016; anxiety; depression  PRECAUTIONS: None  WEIGHT BEARING RESTRICTIONS: No  FALLS:  Has patient fallen in last 6 months? 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 Wears compression stocking 24 hrs most days at MD recommendation.  MUSCLE LENGTH: Hamstrings: mod tight L>R ITB: WFL Piriformis: NT Hip flexors: mild tight B Quads: mild/mod tight L Heelcord: NT  POSTURE:  weight shift right  PALPATION: Good lateral L patellar mobility  LOWER EXTREMITY ROM:  Active ROM Right eval Left eval Left 07/09/22 Left 07/13/22 Left 07/23/22 supine  Knee flexion 130 108 116 seated 125 supine 125 supine 126  Knee extension 0 17 seated LAQ 5 supine 9 seated LAQ 2 supine 6 seated LAQ 2 supine 1   Passive ROM Right eval Left eval  Knee flexion 131 111  Knee extension 0 3  (Blank rows = not tested)  LOWER EXTREMITY MMT:  MMT Right eval Left eval Right 07/13/22 Left 07/13/22 Right 08/04/22 Left 08/04/22  Hip flexion 4+ 4 4+ 4 4+ 4  Hip extension 4 4 4+ 4 4+ 4+  Hip abduction 4- 4- 4 4- 5 5  Hip adduction 4 4- 4+ '4 5 5  '$ Hip internal rotation        Hip external rotation        Knee flexion '5 4 5 4 '$ *    Knee extension 5 4- * 5 4+  5 5  Ankle dorsiflexion 5 4+ 5 4+ 5 5  Ankle plantarflexion        Ankle inversion        Ankle eversion         (Blank rows = not tested) * = pain in anterior knee  GAIT: Distance walked: 60 ft Assistive device utilized: Single point cane Level of assistance: Modified independence and SBA Gait pattern: step through pattern, decreased stance time- Left, decreased stride length, decreased hip/knee flexion- Left, and antalgic Comments: decreased  gait speed   TODAY'S TREATMENT:    08/04/22 THERAPEUTIC EXERCISE: to improve flexibility, strength and mobility.  Verbal and tactile cues throughout for technique.  Rec bike - L3 x 8 min STS 2x5 hands on knees L lateral step up 6' 2x10 with one HA  Knee flexion 25# 1x10  30# 1x10 BLE; 20# x 10 LLE Knee extension 15# 1x10, #20 1x10 BLE; 10# x 10 LLE Leg press 35# 2x10, 20# LLE 1x 10 Standing hip flex with 2 hands on ledge RTB around feet 1x10 ea Golfers lifts 2x10 R/L one HA  07/30/22 THERAPEUTIC EXERCISE: to improve flexibility, strength and mobility.  Verbal and tactile cues throughout for technique.  Rec bike - L3 x 8 min Seated heel slide 10x3" on slider with strap L lateral step up 6' 2x10 with one HA  Knee flexion 25# 2x10BLE; 15# x 10 LLE Knee extension 15# 2x10 BLE; 5# x 10 LLE Golfers lifts 2x10 R/L one HA  07/27/22 THERAPEUTIC EXERCISE: to improve  flexibility, strength and mobility.  Verbal and tactile cues throughout for technique.  Rec bike - L3 x 8 min Knee flexion 20# BLE 2x 10: 15# LLE 2x 10  Knee extension 10# BLE 2x 10: 5# LLE 2x 10 L fwd 6" step-up 2x 10 w/ contralateral knee up coming up Hip Hiker 6'; heel taps x10, increased difficulty but was able to complete 3 [employer] Cone knock-down & righting bilat x2 each (pole for support)  MANUAL THERAPY: To promote improved flexibility, improved joint mobility, increased ROM, and reduced pain. STM to L quads and proximal scar mobilization     PATIENT EDUCATION:  Education details: progress with PT and ongoing PT POC Person educated: Patient Education method: Explanation Education comprehension: verbalized understanding  HOME EXERCISE PROGRAM: Access Code: [email or web address] URL: [email or web address] Date: 07/09/2022 Prepared by: Annie Paras  Exercises - Hooklying Hamstring Stretch with Strap  - 2-3 x daily - 7 x weekly - 3 reps - 30 sec hold - Supine Quadriceps Stretch with Strap on Table  - 2-3 x daily  - 7 x weekly - 3 reps - 30 sec hold - Standing Hip Abduction with Counter Support  - 1 x daily - 7 x weekly - 2 sets - 10 reps - 2-3 sec hold - Standing Hip Extension with Counter Support  - 1 x daily - 7 x weekly - 2 sets - 10 reps - 2-3 sec hold - Standing Hip Flexion with Counter Support  - 1 x daily - 7 x weekly - 2 sets - 10 reps - 2-3 sec hold - Standing March with Counter Support  - 1 x daily - 7 x weekly - 2 sets - 10 reps - 2-3 sec hold - Gastroc Stretch on Wall  - 2-3 x daily - 7 x weekly - 3 reps - 30 sec hold   ASSESSMENT:  CLINICAL IMPRESSION:    Ron is progressing with BLE strength (see MMT). Still having intermittent pain in medial left knee, but seems to improve with strengthening. Advised increasing sit to stands at home to improve function.  OBJECTIVE IMPAIRMENTS: Abnormal gait, decreased activity tolerance, decreased balance, decreased endurance, decreased knowledge of use of DME, decreased mobility, difficulty walking, decreased ROM, decreased strength, hypomobility, increased edema, increased fascial restrictions, impaired perceived functional ability, increased muscle spasms, impaired flexibility, impaired sensation, improper body mechanics, and pain.   ACTIVITY LIMITATIONS: bending, standing, squatting, sleeping, stairs, transfers, bed mobility, and locomotion level  PARTICIPATION LIMITATIONS: meal prep, cleaning, laundry, driving, shopping, community activity, occupation, yard work, and working with his show horses  PERSONAL FACTORS: Past/current experiences, Profession, Time since onset of injury/illness/exacerbation, and 3+ comorbidities: R TKA 01/19/19; L knee arthroscopy with medial menisectomy 09/2021; Lupus; L acoustic neuroma; asthma; skin cancer; morbid obesity; CKD-III; DVT 2016; anxiety; depression  are also affecting patient's functional outcome.   REHAB POTENTIAL: Excellent  CLINICAL DECISION MAKING: Stable/uncomplicated  EVALUATION COMPLEXITY:  Low   GOALS: Goals reviewed with patient? Yes  SHORT TERM GOALS: Target date: 07/20/2022   Patient will be independent with initial HEP. Baseline:  Goal status: MET  07/13/22  2.  Patient will demonstrate improved L knee AROM to >/= 2-115 deg to promote normal gait and stair mechanics. Baseline: L knee AROM 5-108 Goal status: MET  07/09/22 - L knee AROM 2-125  LONG TERM GOALS: Target date: 08/10/2022   Patient will be independent with advanced/ongoing HEP to improve outcomes and carryover.  Baseline:  Goal status: IN PROGRESS  2.  Patient will report  at least 75% improvement in L knee pain to improve QOL. Baseline:  Goal status: IN PROGRESS  3.  Patient will demonstrate improved L knee AROM to >/= 0-120 deg to allow for normal gait and stair mechanics. Baseline: L knee AROM 5-108 Goal status: IN PROGRESS  07/23/22 - Met for flexion ROM - L knee AROM 1-126  4.  Patient will demonstrate improved B LE strength to >/= 4+/5 for improved stability and ease of mobility. Baseline: Refer to MMT table above Goal status: IN PROGRESS  5.  Patient will be able to ambulate 600' with or w/o LRAD and normal gait pattern without increased pain to access community.  Baseline:  Goal status: IN PROGRESS  6. Patient will be able to ascend/descend stairs with 1 HR and reciprocal step pattern safely to access home and community.  Baseline:  Goal status: IN PROGRESS  7.  Patient will report >/= 48/80 on LEFS to demonstrate improved functional ability. Baseline: 39 / 80 = 48.8 % Goal status: IN PROGRESS  8.  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 Baseline:  Goal status: IN PROGRESS - 07/30/22   PLAN:  PT FREQUENCY: 2x/week  PT DURATION: 4-6 weeks  PLANNED INTERVENTIONS: Therapeutic exercises, Therapeutic activity, Neuromuscular re-education, Balance training, Gait training, Patient/Family education, Self Care,  Joint mobilization, Stair training, DME instructions, Dry Needling, Electrical stimulation, Cryotherapy, Moist heat, scar mobilization, Taping, Vasopneumatic device, Ultrasound, Ionotophoresis '4mg'$ /ml Dexamethasone, Manual therapy, and Re-evaluation  PLAN FOR NEXT SESSION: Continue incorporating LE strengthening exercises and balance training as appropriate. Update HEP for hip and quad strengthening progression   Mayline Dragon, PT 08/04/2022, 9:30 AM

## 2022-08-04 ENCOUNTER — Ambulatory Visit: Payer: Medicare Other | Admitting: Physical Therapy

## 2022-08-04 ENCOUNTER — Encounter: Payer: Self-pay | Admitting: Physical Therapy

## 2022-08-04 DIAGNOSIS — R600 Localized edema: Secondary | ICD-10-CM

## 2022-08-04 DIAGNOSIS — M25562 Pain in left knee: Secondary | ICD-10-CM

## 2022-08-04 DIAGNOSIS — R262 Difficulty in walking, not elsewhere classified: Secondary | ICD-10-CM

## 2022-08-04 DIAGNOSIS — R2689 Other abnormalities of gait and mobility: Secondary | ICD-10-CM

## 2022-08-04 DIAGNOSIS — M6281 Muscle weakness (generalized): Secondary | ICD-10-CM | POA: Diagnosis not present

## 2022-08-04 DIAGNOSIS — M25662 Stiffness of left knee, not elsewhere classified: Secondary | ICD-10-CM | POA: Diagnosis not present

## 2022-08-05 NOTE — Therapy (Signed)
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 [name] at [facility], left kidney is non=functioning.  right kidny is at 50%   Clotting disorder ([place])    Hx DVT - Xarelto   Colon polyps    Complication of anesthesia    Post op nausea/vomiting   Depression    severe.  dx 1985   DVT (deep venous thrombosis) ([place])    sees Dr. [name]    History of kidney stones    has kidney stone now.     Hypertension    Hypogonadism male    sees Dr. Baruch Gouty at [facility]   Neuropathy of both feet    OSA (obstructive sleep apnea)    Plantar fasciitis    rt foot   Pneumonia    PONV (postoperative nausea and vomiting)    Renal atrophy, left    sees Dr. Baruch Gouty at [facility]   Sleep apnea    tested 2010  - wears c-pap   Past Surgical History:  Procedure Laterality Date   CHEST WALL TUMOR EXCISION  2007   COLONOSCOPY  04/07/2020   per Dr. Hilarie Fredrickson, adenomatous polyps, repeat in 3 yrs    kidney caluculs  2004-05   KIDNEY STONE SURGERY     x 2c   KNEE ARTHROSCOPY  09/10/2011   right knee, per Dr. Dorna Leitz    KNEE ARTHROSCOPY Right 04/25/2017   Procedure: RIGHT KNEE ARTHROSCOPY,  PARTIAL LATERAL MENISECTOMY, CHONDROPLASTY MEDIAL AND LATERAL PATELLAOFEMORAL;  Surgeon: Dorna Leitz, MD;  Location: [facility];  Service: Orthopedics;  Laterality: Right;   KNEE ARTHROSCOPY Left 09/18/2021   Procedure: ARTHROSCOPY KNEE;  Surgeon: Dorna Leitz, MD;  Location: [facility];  Service: Orthopedics;  Laterality: Left;   KNEE ARTHROSCOPY WITH MEDIAL MENISECTOMY Left 09/18/2021   Procedure: KNEE ARTHROSCOPY WITH MEDIAL MENISECTOMY;  Surgeon: Dorna Leitz, MD;  Location: [facility];  Service: Orthopedics;  Laterality: Left;   madiscus cartilage  2009   MOHS SURGERY     right knee arthroscopy  2008   TONSILLECTOMY     TOTAL KNEE ARTHROPLASTY Right 01/19/2019   Procedure: RIGHT TOTAL KNEE ARTHROPLASTY;  Surgeon: Dorna Leitz, MD;  Location: [facility];  Service: Orthopedics;  Laterality: Right;   TOTAL KNEE ARTHROPLASTY Left 06/14/2022   Procedure: TOTAL KNEE ARTHROPLASTY;  Surgeon: Dorna Leitz, MD;  Location: [facility];  Service: Orthopedics;  Laterality: Left;  UPPER GASTROINTESTINAL ENDOSCOPY     Patient Active Problem List   Diagnosis Date Noted   Vitamin B 12 deficiency 07/14/2022   Preoperative respiratory examination 04/26/2022   Morbid obesity ([place]) 03/26/2022   Primary osteoarthritis of left knee 56/81/2751   [place] of knee, left 70/07/7492   Complex tear of lat mensc, current injury, left knee, init 09/18/2021   Acute meniscal tear, medial, left, initial encounter 09/18/2021   Hyperglycemia 05/30/2019   Primary osteoarthritis of right knee 01/19/2019   CKD (chronic kidney disease), stage III ([place]) 09/13/2017   Renal atrophy, left 09/13/2017   Systemic lupus erythematosus ([place]) 09/13/2017   Dyslipidemia 09/13/2017   Acute meniscal tear, lateral, right, initial encounter 04/25/2017   Osteoarthritis of right knee 04/25/2017   Concussion with loss of consciousness 08/05/2015   Laceration of spleen 08/05/2015   Lumbar stress fracture 08/05/2015   DVT (deep venous thrombosis) ([place])  10/18/2014   Hx of bacterial pneumonia 03/10/2013   COLONIC POLYPS, HX OF 04/08/2010   NEPHROLITHIASIS, HX OF 04/08/2010   ACOUSTIC NEUROMA 04/07/2010   Hypogonadism male 04/07/2010   Depression with anxiety 04/07/2010   SLEEP APNEA, OBSTRUCTIVE 04/07/2010   Asthma 04/07/2010   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   ERECTILE DYSFUNCTION, 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   PLANTAR FASCIITIS 04/07/2010    PCP: [name], MD   REFERRING PROVIDER: Dorna Leitz, MD   REFERRING DIAG: [phone] (ICD-10-CM) - S/P TKR (total knee replacement)   THERAPY DIAG:  Acute pain of left knee  Muscle weakness (generalized)  Stiffness of left knee, not elsewhere classified  Other abnormalities of gait and mobility  Difficulty in walking, not elsewhere classified  Localized edema  RATIONALE FOR EVALUATION AND TREATMENT: Rehabilitation  ONSET DATE: 06/14/22 - L TKA   NEXT MD VISIT: 08/19/22   SUBJECTIVE:                                                                                                                                                                                                         SUBJECTIVE STATEMENT:  States the knee has been feeling fair, after Wednesday's session he had a lot of swelling going down to his L ankle, still has occasional sharp pain underneath the knee cap that has not gone away but has improved. Has had some pain on his R knee. 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   PAIN: Are you having pain? No - discomfort underneath patella   PERTINENT HISTORY:  R TKA 01/19/19; L knee arthroscopy with medial menisectomy 09/2021; Lupus;  L acoustic neuroma - pt reports minor balance issues related to tumor; asthma; skin cancer; morbid obesity; CKD-III; DVT 2016; anxiety; depression  PRECAUTIONS: None  WEIGHT BEARING RESTRICTIONS: No  FALLS:  Has patient fallen in last 6 months? 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 Wears compression stocking 24 hrs most days at MD recommendation.  MUSCLE LENGTH: Hamstrings: mod tight L>R ITB: WFL Piriformis: NT Hip flexors: mild tight B Quads: mild/mod tight L Heelcord: NT  POSTURE:  weight shift right  PALPATION: Good lateral L patellar mobility  LOWER EXTREMITY ROM:  Active ROM Right eval Left eval Left 07/09/22 Left 07/13/22 Left 07/23/22 supine Left 08/06/22  Knee flexion 130 108 116 seated 125 supine 125 supine 126 128  Knee extension 0 17 seated LAQ 5 supine 9 seated LAQ 2 supine 6 seated LAQ 2 supine 1 2 supine   Passive ROM Right eval Left eval  Knee flexion 131 111  Knee extension 0 3  (Blank rows = not tested)  LOWER EXTREMITY MMT:  MMT Right eval Left eval Right 07/13/22 Left 07/13/22 Right 08/04/22 Left 08/04/22  Hip flexion 4+ 4 4+ 4 4+ 4  Hip extension 4 4 4+ 4 4+ 4+  Hip abduction 4- 4- 4 4- 5 5  Hip adduction 4 4- 4+ '4 5 5  '$ Hip internal rotation        Hip external rotation        Knee flexion '5 4 5 4 '$ *    Knee extension 5 4- * 5 4+  5 5  Ankle dorsiflexion 5 4+ 5 4+ 5 5  Ankle plantarflexion        Ankle inversion        Ankle eversion         (Blank rows = not tested) * = pain in anterior knee  GAIT: Distance walked:  60 ft Assistive device utilized: Single point cane Level of assistance: Modified independence and SBA Gait pattern: step through pattern, decreased stance time- Left, decreased stride length, decreased hip/knee flexion- Left, and antalgic Comments: decreased gait speed   TODAY'S TREATMENT:    08/06/22 THERAPEUTIC EXERCISE: to improve flexibility, strength and mobility.  Verbal and tactile cues throughout for technique.  Rec bike - L3 x 8 min Strengthening: STS, Lateral step ups, fwd step ups with kick, knee flexion/extension machine, leg press  L lateral step up 6' 2x10  Hip Hike L leg 6' 2x10  Forward Step Up 6" 2x10 w/ contralateral knee up   Leg press 35# 2x10, 20# LLE 2x 10 Knee Extension #25 2x10, #20 L LE x5, #15 L LE x5  Knee Flexion #25 2x10, #15 L LE 2x10  Lateral Lunges with slider 2x10 bilat   THERAPEUTIC ACTIVITIES: Stair 1 flight up&down for re-assessment  of LTG: MET AROM Measurement for re-assessment of LTG: Not MET  LEFS for re-assessment of LTG: MET  08/04/22 THERAPEUTIC EXERCISE: to improve flexibility, strength and mobility.  Verbal and tactile cues throughout for technique.  Rec bike - L3 x 8 min STS 2x5 hands on knees L lateral step up 6' 2x10 with one HA  Knee flexion 25# 1x10  30# 1x10 BLE; 20# x 10 LLE Knee extension 15# 1x10, #20 1x10 BLE; 10# x 10 LLE Leg press 35# 2x10, 20# LLE 1x 10 Standing hip flex with 2 hands on ledge RTB around feet 1x10 ea Golfers lifts 2x10 R/L one HA  07/30/22 THERAPEUTIC EXERCISE: to improve flexibility, strength and mobility.  Verbal and tactile cues throughout for technique.  Rec bike - L3 x 8 min Seated heel slide 10x3" on slider with strap L lateral step up 6' 2x10 with one HA  Knee flexion 25# 2x10BLE; 15# x 10 LLE Knee extension 15# 2x10 BLE; 5# x 10 LLE Golfers lifts 2x10 R/L one HA   PATIENT EDUCATION:  Education details: progress with PT and ongoing PT POC Person educated: Patient Education method:  Explanation Education comprehension: verbalized understanding  HOME EXERCISE PROGRAM: Access Code: [email or web address] URL: [email or web address] Date: 07/09/2022 Prepared by: Annie Paras  Exercises - Hooklying Hamstring Stretch with Strap  - 2-3 x daily - 7 x weekly - 3 reps - 30 sec hold - Supine Quadriceps Stretch with Strap on Table  - 2-3 x daily - 7 x weekly - 3 reps - 30 sec hold - Standing Hip Abduction with Counter Support  - 1 x daily - 7 x weekly - 2 sets - 10 reps - 2-3 sec hold - Standing Hip Extension with Counter Support  - 1 x daily - 7 x weekly - 2 sets - 10 reps - 2-3 sec hold - Standing Hip Flexion with Counter Support  - 1 x daily - 7 x weekly - 2 sets - 10 reps - 2-3 sec hold - Standing March with Counter Support  - 1 x daily - 7 x weekly - 2 sets - 10 reps - 2-3 sec hold - Gastroc Stretch on Wall  - 2-3 x daily - 7 x weekly - 3 reps - 30 sec hold   ASSESSMENT:  CLINICAL IMPRESSION:    Pt came into today's session feeling pain in his R knee instead of his L knee. Today's session began with re-assessment of three of his LTG and he was able to meet the stair and LEFS goal but was not able to get his knee extension AROM to 0. Nonetheless, pt has shown great progression in his HEP and continues to get better. Strengthening exercises were conducted especially targeting the step down motion because pt stated he still had some discomfort descending stairs. PT introduced a lunge exercise and pt responded to it well without much discomfort, just stated that it was "tough". Pt was made aware that the next session is his last scheduled visit for now and that next session would be making the decision whether he should continue therapy or not. Based on initial conversation today, pt stated that he feels like he has progressed a lot and feels confident in his capabilities. Upon seeing his progress, next session should most likely head to transition towards his HEP and D/C +/- 30 day  hold.  Pt continues to benefit from skilled PT interventions to address the deficits seen above and to hopefully return him to his PLOF.  OBJECTIVE IMPAIRMENTS: Abnormal gait, decreased activity tolerance, decreased balance, decreased endurance, decreased knowledge of use of DME, decreased mobility, difficulty walking, decreased ROM, decreased strength, hypomobility, increased edema, increased fascial restrictions, impaired perceived functional ability, increased muscle spasms, impaired flexibility, impaired sensation, improper body mechanics, and pain.   ACTIVITY LIMITATIONS: bending, standing, squatting, sleeping, stairs, transfers, bed mobility, and locomotion level  PARTICIPATION LIMITATIONS: meal prep, cleaning, laundry, driving, shopping, community activity, occupation, yard work, and working with his show horses  PERSONAL FACTORS: Past/current experiences, Profession, Time since onset of injury/illness/exacerbation, and 3+ comorbidities: R TKA 01/19/19; L knee arthroscopy with medial menisectomy 09/2021; Lupus; L acoustic neuroma; asthma; skin cancer; morbid obesity; CKD-III; DVT 2016; anxiety; depression  are also affecting patient's functional outcome.   REHAB POTENTIAL: Excellent  CLINICAL DECISION MAKING: Stable/uncomplicated  EVALUATION COMPLEXITY: Low   GOALS: Goals reviewed with patient? Yes  SHORT TERM GOALS: Target date: 07/20/2022   Patient will be independent with initial HEP. Baseline:  Goal status: MET  07/13/22  2.  Patient will demonstrate improved L knee AROM to >/= 2-115 deg to promote normal gait and stair mechanics. Baseline: L knee AROM 5-108 Goal status: MET  07/09/22 - L knee AROM 2-125  LONG TERM GOALS: Target date: 08/10/2022   Patient will be independent with advanced/ongoing HEP to improve outcomes and carryover.  Baseline:  Goal status: IN PROGRESS  2.  Patient will report at least 75% improvement in L knee pain to improve QOL. Baseline:  Goal status:  IN PROGRESS  3.  Patient will demonstrate improved L knee AROM to >/= 0-120 deg to allow for normal gait and stair mechanics. Baseline: L knee AROM 5-108 Goal status: IN PROGRESS  08/06/22 - Met for flexion ROM - L knee AROM 2-128  4.  Patient will demonstrate improved B LE strength to >/= 4+/5 for improved stability and ease of mobility. Baseline: Refer to MMT table above Goal status: MET 08/02/22 Partially, all are 4+/5 except for hip flexion 4/5  5.  Patient will be able to ambulate 600' with or w/o LRAD and normal gait pattern without increased pain to access community.  Baseline:  Goal status: MET 08/06/22  6. Patient will be able to ascend/descend stairs with 1 HR and reciprocal step pattern safely to access home and community.  Baseline:  Goal status: MET 08/06/22  7.  Patient will report >/= 48/80 on LEFS to demonstrate improved functional ability. Baseline: 39 / 80 = 48.8 % Goal status: MET 60/80 08/06/22   8.  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 Baseline:  Goal status: MET - 08/06/22 (has not tried running but is able to accomplish all other tasks)    PLAN:  PT FREQUENCY: 2x/week  PT DURATION: 4-6 weeks  PLANNED INTERVENTIONS: Therapeutic exercises, Therapeutic activity, Neuromuscular re-education, Balance training, Gait training, Patient/Family education, Self Care, Joint mobilization, Stair training, DME instructions, Dry Needling, Electrical stimulation, Cryotherapy, Moist heat, scar mobilization, Taping, Vasopneumatic device, Ultrasound, Ionotophoresis '4mg'$ /ml Dexamethasone, Manual therapy, and Re-evaluation  PLAN FOR NEXT SESSION: Re-assess the LTG that are left and possibly transition program to HEP +/- 30-day hold. Refine and update HEP as needed     Dat Derksen Romero-Perozo, Student-PT 08/06/2022, 10:25 AM

## 2022-08-06 ENCOUNTER — Ambulatory Visit: Payer: Medicare Other | Attending: Orthopedic Surgery | Admitting: Physical Therapy

## 2022-08-06 ENCOUNTER — Encounter: Payer: Self-pay | Admitting: Physical Therapy

## 2022-08-06 DIAGNOSIS — M25562 Pain in left knee: Secondary | ICD-10-CM

## 2022-08-06 DIAGNOSIS — R2689 Other abnormalities of gait and mobility: Secondary | ICD-10-CM | POA: Diagnosis not present

## 2022-08-06 DIAGNOSIS — R600 Localized edema: Secondary | ICD-10-CM | POA: Diagnosis not present

## 2022-08-06 DIAGNOSIS — M6281 Muscle weakness (generalized): Secondary | ICD-10-CM | POA: Diagnosis not present

## 2022-08-06 DIAGNOSIS — M25662 Stiffness of left knee, not elsewhere classified: Secondary | ICD-10-CM

## 2022-08-06 DIAGNOSIS — R262 Difficulty in walking, not elsewhere classified: Secondary | ICD-10-CM

## 2022-08-10 ENCOUNTER — Ambulatory Visit: Payer: Medicare Other

## 2022-08-10 DIAGNOSIS — M25562 Pain in left knee: Secondary | ICD-10-CM | POA: Diagnosis not present

## 2022-08-10 DIAGNOSIS — R262 Difficulty in walking, not elsewhere classified: Secondary | ICD-10-CM

## 2022-08-10 DIAGNOSIS — R2689 Other abnormalities of gait and mobility: Secondary | ICD-10-CM | POA: Diagnosis not present

## 2022-08-10 DIAGNOSIS — R600 Localized edema: Secondary | ICD-10-CM

## 2022-08-10 DIAGNOSIS — M6281 Muscle weakness (generalized): Secondary | ICD-10-CM | POA: Diagnosis not present

## 2022-08-10 DIAGNOSIS — M25662 Stiffness of left knee, not elsewhere classified: Secondary | ICD-10-CM | POA: Diagnosis not present

## 2022-08-10 NOTE — Therapy (Addendum)
OUTPATIENT PHYSICAL THERAPY TREATMENT / DISCHARGE SUMMARY      Patient Name: Joseph Tran MRN: [id number] DOB:[birth year], 72 y.o., male Today's Date: 08/06/2022   END OF SESSION:  PT End of Session - 08/06/22 0913     Visit Number 11    Date for PT Re-Evaluation 08/10/22    Authorization Type Medicare & AARP    Progress Note Due on Visit 15    PT Start Time 0928    PT Stop Time 1016    PT Time Calculation (min) 48 min    Activity Tolerance Patient tolerated treatment well    Behavior During Therapy WFL for tasks assessed/performed                   Past Medical History:  Diagnosis Date   Acoustic neuroma (HCC)    benign - left   Anxiety    Arthritis    Asthma    seasonal, when pollen is high, cold air closes me up   Cancer (HCC)    skin cancer -- arm, scalp, upper back   Chronic kidney disease    sees Dr. Jaye Beagle at [facility], left kidney is non=functioning.  right kidny is at 50%   Clotting disorder (HCC)    Hx DVT - Xarelto   Colon polyps    Complication of anesthesia    Post op nausea/vomiting   Depression    severe.  dx 1985   DVT (deep venous thrombosis) (HCC)    sees Dr. Arlan Organ    History of kidney stones    has kidney stone now.     Hypertension    Hypogonadism male    sees Dr. Hadley Pen at [facility]   Neuropathy of both feet    OSA (obstructive sleep apnea)    Plantar fasciitis    rt foot   Pneumonia    PONV (postoperative nausea and vomiting)    Renal atrophy, left    sees Dr. Hadley Pen at [facility]   Sleep apnea    tested 2010  - wears c-pap   Past Surgical History:  Procedure Laterality Date   CHEST WALL TUMOR EXCISION  2007   COLONOSCOPY  04/07/2020   per Dr. Rhea Belton, adenomatous polyps, repeat in 3 yrs    kidney caluculs  2004-05   KIDNEY STONE SURGERY     x 2c   KNEE ARTHROSCOPY  09/10/2011   right knee, per Dr. Jodi Geralds    KNEE ARTHROSCOPY Right 04/25/2017   Procedure: RIGHT  KNEE ARTHROSCOPY, PARTIAL LATERAL MENISECTOMY, CHONDROPLASTY MEDIAL AND LATERAL PATELLAOFEMORAL;  Surgeon: Jodi Geralds, MD;  Location: [facility] OR;  Service: Orthopedics;  Laterality: Right;   KNEE ARTHROSCOPY Left 09/18/2021   Procedure: ARTHROSCOPY KNEE;  Surgeon: Jodi Geralds, MD;  Location: [facility];  Service: Orthopedics;  Laterality: Left;   KNEE ARTHROSCOPY WITH MEDIAL MENISECTOMY Left 09/18/2021   Procedure: KNEE ARTHROSCOPY WITH MEDIAL MENISECTOMY;  Surgeon: Jodi Geralds, MD;  Location: [facility];  Service: Orthopedics;  Laterality: Left;   madiscus cartilage  2009   MOHS SURGERY     right knee arthroscopy  2008   TONSILLECTOMY     TOTAL KNEE ARTHROPLASTY Right 01/19/2019   Procedure: RIGHT TOTAL KNEE ARTHROPLASTY;  Surgeon: Jodi Geralds, MD;  Location: [facility];  Service: Orthopedics;  Laterality: Right;   TOTAL KNEE ARTHROPLASTY Left 06/14/2022   Procedure: TOTAL KNEE ARTHROPLASTY;  Surgeon: Jodi Geralds, MD;  Location: [facility];  Service: Orthopedics;  Laterality: Left;   UPPER GASTROINTESTINAL ENDOSCOPY     Patient Active Problem List   Diagnosis Date Noted   Vitamin B 12 deficiency 07/14/2022   Preoperative respiratory examination 04/26/2022   Morbid obesity (HCC) 03/26/2022   Primary osteoarthritis of left knee 09/18/2021   Plica of knee, left 09/18/2021   Complex tear of lat mensc, current injury, left knee, init 09/18/2021   Acute meniscal tear, medial, left, initial encounter 09/18/2021   Hyperglycemia 05/30/2019   Primary osteoarthritis of right knee 01/19/2019   CKD (chronic kidney disease), stage III (HCC) 09/13/2017   Renal atrophy, left 09/13/2017   Systemic lupus erythematosus (HCC) 09/13/2017   Dyslipidemia 09/13/2017   Acute meniscal tear, lateral, right, initial encounter 04/25/2017   Osteoarthritis of right knee 04/25/2017   Concussion with loss of consciousness 08/05/2015   Laceration of spleen 08/05/2015   Lumbar stress fracture 08/05/2015   DVT (deep venous  thrombosis) (HCC) 10/18/2014   Hx of bacterial pneumonia 03/10/2013   COLONIC POLYPS, HX OF 04/08/2010   NEPHROLITHIASIS, HX OF 04/08/2010   ACOUSTIC NEUROMA 04/07/2010   Hypogonadism male 04/07/2010   Depression with anxiety 04/07/2010   SLEEP APNEA, OBSTRUCTIVE 04/07/2010   Asthma 04/07/2010   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   ERECTILE DYSFUNCTION, 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   PLANTAR FASCIITIS 04/07/2010    PCP: Nelwyn Salisbury, MD   REFERRING PROVIDER: Jodi Geralds, MD   REFERRING DIAG: [phone] (ICD-10-CM) - S/P TKR (total knee replacement)   THERAPY DIAG:  Acute pain of left knee  Muscle weakness (generalized)  Stiffness of left knee, not elsewhere classified  Other abnormalities of gait and mobility  Difficulty in walking, not elsewhere classified  Localized edema  RATIONALE FOR EVALUATION AND TREATMENT: Rehabilitation  ONSET DATE: 06/14/22 - L TKA   NEXT MD VISIT: 08/19/22   SUBJECTIVE:                                                                                                                                                                                                         SUBJECTIVE STATEMENT:  Pt reports after last session he had pain in his R knee all weekend. He reports concern with crepitus in R knee and sharp pain he has been having since October and plans to speak with surgeon about it   PAIN: Are you having pain? No - discomfort underneath patella   PERTINENT HISTORY:  R TKA 01/19/19; L knee arthroscopy with medial menisectomy 09/2021; Lupus; L acoustic neuroma - pt reports minor balance issues related to tumor; asthma; skin cancer;  morbid obesity; CKD-III; DVT 2016; anxiety; depression  PRECAUTIONS: None  WEIGHT BEARING RESTRICTIONS: No  FALLS:  Has patient fallen in last 6 months? 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 Wears compression stocking 24 hrs most days at MD recommendation.  MUSCLE LENGTH: Hamstrings: mod tight L>R ITB: WFL Piriformis: NT Hip flexors: mild tight B Quads: mild/mod tight L Heelcord: NT  POSTURE:  weight shift right  PALPATION: Good lateral L patellar mobility  LOWER EXTREMITY ROM:  Active ROM Right eval Left eval Left 07/09/22 Left 07/13/22 Left 07/23/22 supine Left 08/06/22  Knee flexion 130 108 116 seated 125 supine 125 supine 126 128  Knee extension 0 17 seated LAQ 5 supine 9 seated LAQ 2 supine 6 seated LAQ 2 supine 1 2 supine   Passive ROM Right eval Left eval  Knee flexion 131 111  Knee extension 0 3  (Blank rows = not tested)  LOWER EXTREMITY MMT:  MMT Right eval Left eval Right 07/13/22 Left 07/13/22 Right 08/04/22 Left 08/04/22  Hip flexion 4+ 4 4+ 4 4+ 4  Hip extension 4 4 4+ 4 4+ 4+  Hip abduction 4- 4- 4 4- 5 5  Hip adduction 4 4- 4+ Hip internal rotation        Hip external rotation        Knee flexion *    Knee extension 5 4- * 5 4+  5 5  Ankle dorsiflexion 5 4+ 5 4+ 5 5  Ankle plantarflexion        Ankle inversion        Ankle eversion         (Blank rows = not tested) * = pain in anterior knee  GAIT: Distance walked: 60 ft Assistive device utilized: Single point cane Level of assistance: Modified independence and  SBA Gait pattern: step through pattern, decreased stance time- Left, decreased stride length, decreased hip/knee flexion- Left, and antalgic Comments: decreased gait speed   TODAY'S TREATMENT:   08/10/22  Therapeutic Exercise: to improve strength and mobility.  Demo, verbal and tactile cues throughout for technique.  Goals assessed (ROM and pain) 4 way hip L LE x 10 GTB Mini squat x 10 no UE support Review of HEP for D/C 08/06/22 THERAPEUTIC EXERCISE: to improve flexibility, strength and mobility.  Verbal and tactile cues throughout for technique.  Rec bike - L3 x 8 min Strengthening: STS, Lateral step ups, fwd step ups with kick, knee flexion/extension machine, leg press  L lateral step up 6' 2x10  Hip Hike L leg 6' 2x10  Forward Step Up 6" 2x10 w/ contralateral knee up   Leg press 35# 2x10, 20# LLE 2x 10 Knee Extension #25 2x10, #20 L LE x5, #15 L  LE x5  Knee Flexion #25 2x10, #15 L LE 2x10  Lateral Lunges with slider 2x10 bilat   THERAPEUTIC ACTIVITIES: Stair 1 flight up&down for re-assessment of LTG: MET AROM Measurement for re-assessment of LTG: Not MET  LEFS for re-assessment of LTG: MET  08/04/22 THERAPEUTIC EXERCISE: to improve flexibility, strength and mobility.  Verbal and tactile cues throughout for technique.  Rec bike - L3 x 8 min STS 2x5 hands on knees L lateral step up 6' 2x10 with one HA  Knee flexion 25# 1x10  30# 1x10 BLE; 20# x 10 LLE Knee extension 15# 1x10, #20 1x10 BLE; 10# x 10 LLE Leg press 35# 2x10, 20# LLE 1x 10 Standing hip flex with 2 hands on ledge RTB around feet 1x10 ea Golfers lifts 2x10 R/L one HA  07/30/22 THERAPEUTIC EXERCISE: to improve flexibility, strength and mobility.  Verbal and tactile cues throughout for technique.  Rec bike - L3 x 8 min Seated heel slide 10x3" on slider with strap L lateral step up 6' 2x10 with one HA  Knee flexion 25# 2x10BLE; 15# x 10 LLE Knee extension 15# 2x10 BLE; 5# x 10 LLE Golfers lifts 2x10 R/L one  HA   PATIENT EDUCATION:  Education details: progress with PT and ongoing PT POC Person educated: Patient Education method: Explanation Education comprehension: verbalized understanding  HOME EXERCISE PROGRAM: Access Code: [email or web address] URL: [email or web address] Date: 07/09/2022 Prepared by: Glenetta Hew  Exercises - Hooklying Hamstring Stretch with Strap  - 2-3 x daily - 7 x weekly - 3 reps - 30 sec hold - Supine Quadriceps Stretch with Strap on Table  - 2-3 x daily - 7 x weekly - 3 reps - 30 sec hold - Standing Hip Abduction with Counter Support  - 1 x daily - 7 x weekly - 2 sets - 10 reps - 2-3 sec hold - Standing Hip Extension with Counter Support  - 1 x daily - 7 x weekly - 2 sets - 10 reps - 2-3 sec hold - Standing Hip Flexion with Counter Support  - 1 x daily - 7 x weekly - 2 sets - 10 reps - 2-3 sec hold - Standing March with Counter Support  - 1 x daily - 7 x weekly - 2 sets - 10 reps - 2-3 sec hold - Gastroc Stretch on Wall  - 2-3 x daily - 7 x weekly - 3 reps - 30 sec hold   ASSESSMENT:  CLINICAL IMPRESSION:    Assessed remaining LTGs with patient to ensure he is prepared for D/C. He met LTG #2 for pain in L knee (85% improvement). L knee ROM remains the same. Reviewed HEP and added mini squat to work on functional strengthening. He has been having issues with his R knee, which he had TKA back in 2020. He notes crepitus that seems to be worsening and sharp pains in the knee which he reports to have had since October. This seems to have been aggravated after last session. Ron would like to go on 30 day hold, as his L knee has improved functionally but would like to consult with MD about R knee issues. Based on his progress with PT he has met all goals except for LTG #3 for ROM, 30 day hold is appropriate at this time.  OBJECTIVE IMPAIRMENTS: Abnormal gait, decreased activity tolerance, decreased balance, decreased endurance, decreased knowledge of use of DME,  decreased mobility, difficulty walking, decreased ROM, decreased strength, hypomobility, increased edema, increased fascial restrictions, impaired perceived functional  ability, increased muscle spasms, impaired flexibility, impaired sensation, improper body mechanics, and pain.   ACTIVITY LIMITATIONS: bending, standing, squatting, sleeping, stairs, transfers, bed mobility, and locomotion level  PARTICIPATION LIMITATIONS: meal prep, cleaning, laundry, driving, shopping, community activity, occupation, yard work, and working with his show horses  PERSONAL FACTORS: Past/current experiences, Profession, Time since onset of injury/illness/exacerbation, and 3+ comorbidities: R TKA 01/19/19; L knee arthroscopy with medial menisectomy 09/2021; Lupus; L acoustic neuroma; asthma; skin cancer; morbid obesity; CKD-III; DVT 2016; anxiety; depression  are also affecting patient's functional outcome.   REHAB POTENTIAL: Excellent  CLINICAL DECISION MAKING: Stable/uncomplicated  EVALUATION COMPLEXITY: Low   GOALS: Goals reviewed with patient? Yes  SHORT TERM GOALS: Target date: 07/20/2022   Patient will be independent with initial HEP. Baseline:  Goal status: MET  07/13/22  2.  Patient will demonstrate improved L knee AROM to >/= 2-115 deg to promote normal gait and stair mechanics. Baseline: L knee AROM 5-108 Goal status: MET  07/09/22 - L knee AROM 2-125  LONG TERM GOALS: Target date: 08/10/2022   Patient will be independent with advanced/ongoing HEP to improve outcomes and carryover.  Baseline:  Goal status: MET  08/10/22  2.  Patient will report at least 75% improvement in L knee pain to improve QOL. Baseline:  Goal status: MET 08/10/22 - 85% improvement  3.  Patient will demonstrate improved L knee AROM to >/= 0-120 deg to allow for normal gait and stair mechanics. Baseline: L knee AROM 5-108 Goal status: PARTIALLY MET  08/06/22 - Met for flexion ROM - L knee AROM 2-128  4.  Patient will  demonstrate improved B LE strength to >/= 4+/5 for improved stability and ease of mobility. Baseline: Refer to MMT table above Goal status: MET 08/02/22 Partially, all are 4+/5 except for hip flexion 4/5  5.  Patient will be able to ambulate 600' with or w/o LRAD and normal gait pattern without increased pain to access community.  Baseline:  Goal status: MET 08/06/22  6. Patient will be able to ascend/descend stairs with 1 HR and reciprocal step pattern safely to access home and community.  Baseline:  Goal status: MET 08/06/22  7.  Patient will report >/= 48/80 on LEFS to demonstrate improved functional ability. Baseline: 39 / 80 = 48.8 % Goal status: MET  08/06/22 - 60/80    8.  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 Baseline:  Goal status: MET  08/06/22 (has not tried running but is able to accomplish all other tasks)    PLAN:  PT FREQUENCY: 2x/week  PT DURATION: 4-6 weeks  PLANNED INTERVENTIONS: Therapeutic exercises, Therapeutic activity, Neuromuscular re-education, Balance training, Gait training, Patient/Family education, Self Care, Joint mobilization, Stair training, DME instructions, Dry Needling, Electrical stimulation, Cryotherapy, Moist heat, scar mobilization, Taping, Vasopneumatic device, Ultrasound, Ionotophoresis /ml Dexamethasone, Manual therapy, and Re-evaluation  PLAN FOR NEXT SESSION: 30 day hold   Darrill Vreeland L Yolando Gillum, PTA 08/10/2022, 10:18 AM   PHYSICAL THERAPY DISCHARGE SUMMARY  Visits from Start of Care: 12  Current functional level related to goals / functional outcomes:   Refer to above clinical impression and goal assessment for status as of last visit on 08/10/22. Patient was placed on hold for 30 days and has not needed to return to PT, therefore will proceed with discharge from PT for this episode.     Remaining deficits:   As above.   Education / Equipment:  HEP   Patient  agrees to discharge. Patient goals were mostly met. Patient is being discharged due to being pleased with the current functional level.  Marry Guan, PT 10/28/22, 5:53 PM  [facility] [address]  [address] [place], [place], [address] Phone: [phone]   Fax:  [phone]

## 2022-08-19 ENCOUNTER — Other Ambulatory Visit: Payer: Self-pay | Admitting: Family Medicine

## 2022-09-03 HISTORY — PX: REPLACEMENT TOTAL KNEE: SUR1224

## 2022-09-12 ENCOUNTER — Other Ambulatory Visit: Payer: Self-pay | Admitting: Family Medicine

## 2022-10-11 ENCOUNTER — Telehealth: Payer: Self-pay | Admitting: Family Medicine

## 2022-10-11 ENCOUNTER — Other Ambulatory Visit: Payer: Self-pay | Admitting: Family Medicine

## 2022-10-11 DIAGNOSIS — D631 Anemia in chronic kidney disease: Secondary | ICD-10-CM | POA: Diagnosis not present

## 2022-10-11 DIAGNOSIS — N183 Chronic kidney disease, stage 3 unspecified: Secondary | ICD-10-CM | POA: Diagnosis not present

## 2022-10-11 NOTE — Telephone Encounter (Signed)
I spoke to Dr. Leretha Dykes, his nephrologist, and there is a question as to whether he has PAF or not. He is asymptomatic. She saw him today and he had an irregular pulse. They were not able to get an EKG. His BP and heart  rate are controlled, and he is already on Xarelto. We will make an appt to see him here later this week to discuss this.

## 2022-10-11 NOTE — Telephone Encounter (Signed)
Calling regarding mutual patient, requesting return call

## 2022-10-11 NOTE — Telephone Encounter (Signed)
Left pt a detailed message on both voicemail and via MyChart advising pt of Dr Clent Ridges recommendation

## 2022-10-13 ENCOUNTER — Ambulatory Visit (INDEPENDENT_AMBULATORY_CARE_PROVIDER_SITE_OTHER): Payer: Medicare Other | Admitting: Family Medicine

## 2022-10-13 ENCOUNTER — Encounter: Payer: Self-pay | Admitting: Family Medicine

## 2022-10-13 VITALS — BP 118/78 | HR 83 | Temp 98.1°F | Wt 251.0 lb

## 2022-10-13 DIAGNOSIS — I499 Cardiac arrhythmia, unspecified: Secondary | ICD-10-CM

## 2022-10-13 DIAGNOSIS — I4891 Unspecified atrial fibrillation: Secondary | ICD-10-CM | POA: Diagnosis not present

## 2022-10-13 MED ORDER — PRAZOSIN HCL 2 MG PO CAPS: 2.0000 mg | ORAL_CAPSULE | Freq: Every day | ORAL | 0 refills | Status: DC

## 2022-10-13 MED ORDER — RIVAROXABAN 20 MG PO TABS: 20.0000 mg | ORAL_TABLET | Freq: Every day | ORAL | 2 refills | Status: DC

## 2022-10-13 NOTE — Progress Notes (Signed)
   Subjective:    Patient ID: Joseph Tran, male    DOB: June 12, 1951, 72 y.o.   MRN: [id number]  HPI Here to discuss irregular heartbeats. His last EKG taken a year ago was normal. 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 Since then he has noticed brief spells of either rapid heart beats or hard beats about once or twice a day. No chest pain or SOB. He saw Dr. Jaye Beagle at [facility] last week, and she also noticed this on his exam. He is now seeing [place]. Of note he has been taking Xarelto for a hx of DVT's for several years.    Review of Systems  Constitutional: Negative.   Respiratory: Negative.    Cardiovascular:  Positive for palpitations. Negative for chest pain and leg swelling.  Gastrointestinal: Negative.   Genitourinary: Negative.   Neurological: Negative.        Objective:   Physical Exam Constitutional:      Appearance: Normal appearance.  Cardiovascular:     Rate and Rhythm: Normal rate. Rhythm irregular.     Pulses: Normal pulses.     Heart sounds: Normal heart sounds.     Comments: EKG shows atrial fibrillation  Pulmonary:     Effort: Pulmonary effort is normal.     Breath sounds: Normal breath sounds.  Musculoskeletal:     Right lower leg: No edema.     Left lower leg: No edema.  Neurological:     Mental Status: He is alert.           Assessment & Plan:  New diagnosis of atrial fibrillation. He is asymptomatic, and his heart rate and BP are well controlled. We will increase his Xarelto dose from 10 mg daily to 20 mg daily. Refer to Cardiology. We spent a total of (32   ) minutes reviewing records and discussing these issues.  Gershon Crane, MD

## 2022-10-14 NOTE — Telephone Encounter (Signed)
Pt had an office visit with Dr Clent Ridges on 10/13/22 regarding this problem

## 2022-10-17 ENCOUNTER — Encounter: Payer: Self-pay | Admitting: Family Medicine

## 2022-10-18 NOTE — Telephone Encounter (Signed)
I agree , this is further out than I expected, but I think he will be fine to wait until then. As we discussed, he is already anticoagulated, his BP and heart rate are controlled, and he has no symptoms. If anything changes between now and that appointment, let us know

## 2022-10-22 ENCOUNTER — Telehealth: Payer: Self-pay

## 2022-10-22 NOTE — Progress Notes (Signed)
Care Management & Coordination Services Pharmacy Team  Reason for Encounter: Hypertension  Contacted patient to discuss hypertension disease state. Spoke with patient on 10/25/2022   Current antihypertensive regimen:  Diltazem 240 mg daily  Patient verbally confirms he is taking the above medications as directed. Yes  How often are you checking your Blood Pressure? Patient checks blood pressures 1-2 times daily,  he checks his blood pressure at various times  Current home BP readings:  DATE:             BP                10/25/22 132/84 10/24/22 135/84   142/95 10/23/22 117/76   123/73 10/22/22 110/74   117/82  Wrist or arm cuff: Arm cuff  OTC medications including pseudoephedrine or NSAIDs? Patient denies  Any readings above 180/100? Patient denies  What recent interventions/DTPs have been made by any provider to improve Blood Pressure control since last CPP Visit: No recent interventions  Any recent hospitalizations or ED visits since last visit with CPP? Admitted to [facility] on 06/14/2022 (7 hours) due to primary osteoarthritis of left knee.   What diet changes have been made to improve Blood Pressure Control?  Patient follows a low sodium and low carbohydrate Breakfast - patient doesn't eat breakfast if he does it is oatmeal Lunch - salad without meat Dinner - kale salad without meat Snack - patient will have a variety of vegetables with a dressing Patient states the only meat he has is if he is running short of time and he will then have a burger or chicken.  Caffeine intake - patient denies Salt intake - rarely adds salt  What exercise is being done to improve your Blood Pressure Control?  Patient shows horses daily and working at [employer]  Adherence Review: Is the patient currently on ACE/ARB medication? No Does the patient have >5 day gap between last estimated fill dates? No  Care Gaps: AWV - completed 02/26/2022, scheduled 03/03/2023 Last  BP - 118/78 Urine ACR - never done Hep C Screen - never done Pneumovx - postponed   Star Rating Drugs: Semaglutide 1mg   - last filled 05/08/2022 28 DS at [facility] (patient is taking this for weight loss and awaiting prior authorization requested with Dr. Clent Ridges at his last visit)  Chart Updates: Recent office visits:  10/13/2022 Gershon Crane MD - Patient was seen for Atrial fibrillation and additional concern. Started Prazosin 2 mg daily. Increased Rivaroxaban 20 mg daily. Discontinued Ondansetron, Oxycodone, Percocet, Tadalafil and Tizanidine.   05/12/2022 Gershon Crane MD - Patient was seen for chronic pain of left knee and additional concerns. Discontinued Norco.  Recent consult visits:  10/11/2022 Jaye Beagle MD (nephrology) - Patient was seen for chronic kidney disease stage 3 and additional concerns. Discontinued Prazosin.   08/02/2022 Hadley Pen MD (urology) - Patient was seen for BPH and additional concerns. No medication changes.   07/14/2022 Eileen Stanford NP (cancer center) - Patient was seen for pernicious anemia and additional concerns. Discontinued Tramadol.   04/26/2022 Micheline Maze NP (pulmonary) - Patient was seen for Mild intermittent asthma without complication and additional concerns. Discontinued Advair.    Hospital visits:  Admitted to [facility] on 06/14/2022 (7 hours) due to primary osteoarthritis of left knee.    New?Medications Started at [facility] Discharge:?? oxyCODONE-acetaminophen (PERCOCET/ROXICET) tiZANidine (ZANAFLEX) Medication Changes at Hospital Discharge: docusate sodium (COLACE) rivaroxaban (Xarelto) Medications Discontinued at Hospital Discharge: None Medications that remain the same  after Hospital Discharge:??  -All other medications will remain the same.    Medications: Outpatient Encounter Medications as of 10/22/2022  Medication Sig Note   acetaminophen (TYLENOL) 500 MG tablet Take 1,000 mg by mouth every 8 (eight) hours as  needed for moderate pain or mild pain.    albuterol (PROAIR HFA) 108 (90 Base) MCG/ACT inhaler INHALE 2 PUFFS EVERY 4  HOURS AS NEEDED FOR  WHEEZING OR SHORTNESS OF  BREATH    Apple Cider Vinegar 500 MG TABS Take 600 mg by mouth daily.    aspirin EC 81 MG tablet Take 162 mg by mouth daily. Takes in AM    b complex vitamins capsule Take 1 capsule by mouth daily.    bisacodyl (DULCOLAX) 5 MG EC tablet Take 5 mg by mouth at bedtime.    buPROPion (WELLBUTRIN XL) 300 MG 24 hr tablet TAKE 1 TABLET BY MOUTH DAILY    Cholecalciferol 50 MCG (2000 UT) TABS Take 2,000 Units by mouth daily.    clindamycin (CLEOCIN) 300 MG capsule Take 600 mg by mouth once. 09/09/2021: Dental appointment only   clonazePAM (KLONOPIN) 1 MG tablet Take 1 tablet (1 mg total) by mouth 2 (two) times daily as needed for anxiety. for anxiety    desvenlafaxine (PRISTIQ) 50 MG 24 hr tablet TAKE 1 TABLET BY MOUTH DAILY    diltiazem (CARDIZEM CD) 240 MG 24 hr capsule Take 240 mg by mouth daily.    diltiazem 2 % GEL Apply 1 application. topically daily as needed (Anal fissuresanal). prn    docusate sodium (COLACE) 100 MG capsule Take 1 capsule (100 mg total) by mouth 2 (two) times daily.    furosemide (LASIX) 20 MG tablet TAKE 1 TABLET BY MOUTH  DAILY AS NEEDED    gabapentin (NEURONTIN) 300 MG capsule TAKE 1 CAPSULE(300 MG) BY MOUTH AT BEDTIME    hydroxypropyl methylcellulose / hypromellose (ISOPTO TEARS / GONIOVISC) 2.5 % ophthalmic solution Place 1-2 drops into both eyes 3 (three) times daily as needed for dry eyes ((SCHEDULED EACH MORNING)).    prazosin (MINIPRESS) 2 MG capsule Take 1 capsule (2 mg total) by mouth at bedtime.    psyllium (METAMUCIL) 58.6 % packet Take 1 packet by mouth at bedtime.    rivaroxaban (XARELTO) 20 MG TABS tablet Take 1 tablet (20 mg total) by mouth daily with supper.    Semaglutide, 1 MG/DOSE, 4 MG/3ML SOPN Inject 1 mg as directed once a week. (Patient taking differently: Inject 1 mg as directed once a week.  Pt takes on Saturday)    tamsulosin (FLOMAX) 0.4 MG CAPS capsule Take 1 capsule (0.4 mg total) by mouth daily.    No facility-administered encounter medications on file as of 10/22/2022.  Fill History:   Dispensed Days Supply Quantity Provider Pharmacy  BUPROPION HYDROCHLORIDE ER (XL)  300 MG TB24 09/13/2022 90 90 tablet      Dispensed Days Supply Quantity Provider Pharmacy  CLONAZEPAM 1MG  TABLETS 05/14/2022 90 180 each      Dispensed Days Supply Quantity Provider Pharmacy  DESVENLAFAXINE ER  50 MG TB24 09/12/2022 90 90 tablet      Dispensed Days Supply Quantity Provider Pharmacy  DILTIAZEM CD 240MG  CAPSULES (24 HR) 07/30/2022 90 90 each      Dispensed Days Supply Quantity Provider Pharmacy  FUROSEMIDE  20 MG TABS 03/23/2022 90 90 tablet      Dispensed Days Supply Quantity Provider Pharmacy  GABAPENTIN 300MG  CAPSULES 10/01/2022 30 30 each      Dispensed  Days Supply Quantity Provider Pharmacy  PRAZOSIN HYDROCHLORIDE  2 MG CAPS 10/11/2022 90 90 capsule      Dispensed Days Supply Quantity Provider Pharmacy  XARELTO 20MG  TABLETS 10/13/2022 90 90 each      Dispensed Days Supply Quantity Provider Pharmacy  OZEMPIC 1MG  PER DOSE (1X4MG  PEN) 05/08/2022 28 3 mL      Dispensed Days Supply Quantity Provider Pharmacy  TAMSULOSIN HYDROCHLORIDE  0.4 MG CAPS 09/12/2022 90 90 capsule     Recent Office Vitals: BP Readings from Last 3 Encounters:  10/13/22 118/78  07/14/22 111/71  06/14/22 125/82   Pulse Readings from Last 3 Encounters:  10/13/22 83  07/14/22 88  06/14/22 75    Wt Readings from Last 3 Encounters:  10/13/22 251 lb (113.9 kg)  07/14/22 251 lb 1.3 oz (113.9 kg)  06/14/22 252 lb (114.3 kg)     Kidney Function Lab Results  Component Value Date/Time   CREATININE 1.87 (H) 07/14/2022 09:50 AM   CREATININE 1.92 (H) 06/03/2022 01:17 PM   CREATININE 2.01 (H) 03/24/2022 09:58 AM   CREATININE 1.56 (H) 06/04/2020 09:50 AM   CREATININE 2.1 (H) 06/29/2017 02:52 PM   GFR 39.34  (L) 07/21/2021 08:41 AM   GFRNONAA 38 (L) 07/14/2022 09:50 AM   GFRNONAA 45 (L) 06/04/2020 09:50 AM   GFRAA 52 (L) 06/04/2020 09:50 AM       Latest Ref Rng & Units 07/14/2022    9:50 AM 06/03/2022    1:17 PM 03/24/2022    9:58 AM  BMP  Glucose 70 - 99 mg/dL 161  86  92   BUN 8 - 23 mg/dL 17  22  20    Creatinine 0.61 - 1.24 mg/dL 0.96  0.45  4.09   Sodium 135 - 145 mmol/L 139  138  141   Potassium 3.5 - 5.1 mmol/L 3.8  4.1  4.4   Chloride 98 - 111 mmol/L 108  106  108   CO2 22 - 32 mmol/L 23  26  27    Calcium 8.9 - 10.3 mg/dL 8.9  9.5  9.6    Inetta [facility]  Clinical Pharmacist Assistant [phone]

## 2022-10-29 ENCOUNTER — Telehealth: Payer: Self-pay

## 2022-10-29 NOTE — Telephone Encounter (Signed)
Received PA for Ozempic 1mg , chart reviewed, I don't see where he has type 2 diabetes. PA closed.

## 2022-11-02 DIAGNOSIS — M25562 Pain in left knee: Secondary | ICD-10-CM | POA: Diagnosis not present

## 2022-11-02 DIAGNOSIS — M542 Cervicalgia: Secondary | ICD-10-CM | POA: Diagnosis not present

## 2022-11-08 ENCOUNTER — Other Ambulatory Visit: Payer: Self-pay | Admitting: Family Medicine

## 2022-11-09 ENCOUNTER — Encounter: Payer: Self-pay | Admitting: Family Medicine

## 2022-11-09 ENCOUNTER — Ambulatory Visit (INDEPENDENT_AMBULATORY_CARE_PROVIDER_SITE_OTHER): Payer: Medicare Other | Admitting: Family Medicine

## 2022-11-09 VITALS — BP 124/80 | HR 65 | Temp 98.3°F | Wt 255.0 lb

## 2022-11-09 DIAGNOSIS — I480 Paroxysmal atrial fibrillation: Secondary | ICD-10-CM | POA: Insufficient documentation

## 2022-11-09 DIAGNOSIS — G4700 Insomnia, unspecified: Secondary | ICD-10-CM | POA: Diagnosis not present

## 2022-11-09 DIAGNOSIS — M1711 Unilateral primary osteoarthritis, right knee: Secondary | ICD-10-CM

## 2022-11-09 MED ORDER — GABAPENTIN 300 MG PO CAPS: ORAL_CAPSULE | ORAL | 5 refills | Status: DC

## 2022-11-09 MED ORDER — CLONAZEPAM 1 MG PO TABS: 1.0000 mg | ORAL_TABLET | Freq: Two times a day (BID) | ORAL | 1 refills | Status: DC | PRN

## 2022-11-09 NOTE — Progress Notes (Signed)
   Subjective:    Patient ID: Joseph Tran, male    DOB: 1951/06/28, 72 y.o.   MRN: [id number]  HPI Here to check on refills. He feels well in general. He has not felt any palpitations for several weeks. His BP has been stable at home.    Review of Systems  Constitutional: Negative.   Respiratory: Negative.    Cardiovascular: Negative.        Objective:   Physical Exam Constitutional:      Appearance: Normal appearance.  Cardiovascular:     Rate and Rhythm: Normal rate and regular rhythm.     Pulses: Normal pulses.     Heart sounds: Normal heart sounds.  Pulmonary:     Effort: Pulmonary effort is normal.     Breath sounds: Normal breath sounds.  Neurological:     Mental Status: He is alert.           Assessment & Plan:  Paroxysmal atrial fibrillation, he is in sinus rhythm today. He will see Dr. Tomie China for this on 12-24-22. His OA pain and insomnia are stable, so we refilled the Gabapentin and Clonazepam. Gershon Crane, MD

## 2022-11-12 ENCOUNTER — Inpatient Hospital Stay: Payer: Medicare Other | Admitting: Hematology & Oncology

## 2022-11-12 ENCOUNTER — Inpatient Hospital Stay: Payer: Medicare Other

## 2022-11-12 ENCOUNTER — Other Ambulatory Visit: Payer: Self-pay

## 2022-11-14 ENCOUNTER — Other Ambulatory Visit: Payer: Self-pay | Admitting: Family Medicine

## 2022-12-03 ENCOUNTER — Ambulatory Visit (INDEPENDENT_AMBULATORY_CARE_PROVIDER_SITE_OTHER): Payer: Medicare Other | Admitting: Internal Medicine

## 2022-12-03 ENCOUNTER — Inpatient Hospital Stay: Payer: Medicare Other | Attending: Hematology & Oncology

## 2022-12-03 ENCOUNTER — Inpatient Hospital Stay (HOSPITAL_BASED_OUTPATIENT_CLINIC_OR_DEPARTMENT_OTHER): Payer: Medicare Other | Admitting: Hematology & Oncology

## 2022-12-03 ENCOUNTER — Encounter: Payer: Self-pay | Admitting: Internal Medicine

## 2022-12-03 ENCOUNTER — Encounter: Payer: Self-pay | Admitting: Hematology & Oncology

## 2022-12-03 ENCOUNTER — Inpatient Hospital Stay: Payer: Medicare Other

## 2022-12-03 ENCOUNTER — Telehealth: Payer: Self-pay | Admitting: Internal Medicine

## 2022-12-03 VITALS — BP 113/83 | HR 57 | Temp 97.9°F | Resp 20 | Ht 72.0 in | Wt 255.0 lb

## 2022-12-03 VITALS — BP 114/68 | HR 73 | Ht 72.0 in | Wt 256.6 lb

## 2022-12-03 DIAGNOSIS — N1831 Chronic kidney disease, stage 3a: Secondary | ICD-10-CM

## 2022-12-03 DIAGNOSIS — E538 Deficiency of other specified B group vitamins: Secondary | ICD-10-CM | POA: Diagnosis not present

## 2022-12-03 DIAGNOSIS — I10 Essential (primary) hypertension: Secondary | ICD-10-CM | POA: Diagnosis not present

## 2022-12-03 DIAGNOSIS — I4811 Longstanding persistent atrial fibrillation: Secondary | ICD-10-CM

## 2022-12-03 DIAGNOSIS — I4891 Unspecified atrial fibrillation: Secondary | ICD-10-CM | POA: Diagnosis not present

## 2022-12-03 DIAGNOSIS — D510 Vitamin B12 deficiency anemia due to intrinsic factor deficiency: Secondary | ICD-10-CM

## 2022-12-03 DIAGNOSIS — I82401 Acute embolism and thrombosis of unspecified deep veins of right lower extremity: Secondary | ICD-10-CM

## 2022-12-03 DIAGNOSIS — D509 Iron deficiency anemia, unspecified: Secondary | ICD-10-CM

## 2022-12-03 LAB — CMP (CANCER CENTER ONLY)
ALT: 13 U/L (ref 0–44)
AST: 14 U/L — ABNORMAL LOW (ref 15–41)
Albumin: 4.2 g/dL (ref 3.5–5.0)
Alkaline Phosphatase: 75 U/L (ref 38–126)
Anion gap: 7 (ref 5–15)
BUN: 22 mg/dL (ref 8–23)
CO2: 24 mmol/L (ref 22–32)
Calcium: 9.2 mg/dL (ref 8.9–10.3)
Chloride: 112 mmol/L — ABNORMAL HIGH (ref 98–111)
Creatinine: 1.8 mg/dL — ABNORMAL HIGH (ref 0.61–1.24)
GFR, Estimated: 40 mL/min — ABNORMAL LOW (ref >60)
Glucose, Bld: 95 mg/dL (ref 70–99)
Potassium: 4.1 mmol/L (ref 3.5–5.1)
Sodium: 143 mmol/L (ref 135–145)
Total Bilirubin: 0.6 mg/dL (ref 0.3–1.2)
Total Protein: 6.7 g/dL (ref 6.5–8.1)

## 2022-12-03 LAB — CBC WITH DIFFERENTIAL (CANCER CENTER ONLY)
Abs Immature Granulocytes: 0.02 10*3/uL (ref 0.00–0.07)
Basophils Absolute: 0.1 10*3/uL (ref 0.0–0.1)
Basophils Relative: 1 %
Eosinophils Absolute: 0.2 10*3/uL (ref 0.0–0.5)
Eosinophils Relative: 4 %
HCT: 43.2 % (ref 39.0–52.0)
Hemoglobin: 14.2 g/dL (ref 13.0–17.0)
Immature Granulocytes: 0 %
Lymphocytes Relative: 22 %
Lymphs Abs: 1.2 10*3/uL (ref 0.7–4.0)
MCH: 31.4 pg (ref 26.0–34.0)
MCHC: 32.9 g/dL (ref 30.0–36.0)
MCV: 95.6 fL (ref 80.0–100.0)
Monocytes Absolute: 0.4 10*3/uL (ref 0.1–1.0)
Monocytes Relative: 8 %
Neutro Abs: 3.3 10*3/uL (ref 1.7–7.7)
Neutrophils Relative %: 65 %
Platelet Count: 164 10*3/uL (ref 150–400)
RBC: 4.52 MIL/uL (ref 4.22–5.81)
RDW: 13 % (ref 11.5–15.5)
WBC Count: 5.2 10*3/uL (ref 4.0–10.5)
nRBC: 0 % (ref 0.0–0.2)

## 2022-12-03 LAB — TSH: TSH: 2.388 u[IU]/mL (ref 0.350–4.500)

## 2022-12-03 LAB — IRON AND IRON BINDING CAPACITY (CC-WL,HP ONLY)
Iron: 69 ug/dL (ref 45–182)
Saturation Ratios: 21 % (ref 17.9–39.5)
TIBC: 326 ug/dL (ref 250–450)
UIBC: 257 ug/dL (ref 117–376)

## 2022-12-03 LAB — VITAMIN B12: Vitamin B-12: 296 pg/mL (ref 180–914)

## 2022-12-03 LAB — FERRITIN: Ferritin: 34 ng/mL (ref 24–336)

## 2022-12-03 LAB — LACTATE DEHYDROGENASE: LDH: 174 U/L (ref 98–192)

## 2022-12-03 MED ORDER — CYANOCOBALAMIN 1000 MCG/ML IJ SOLN
1000.0000 ug | Freq: Once | INTRAMUSCULAR | Status: AC
  Administered 2022-12-03: 1000 ug via INTRAMUSCULAR
  Filled 2022-12-03: qty 1

## 2022-12-03 MED ORDER — METOPROLOL TARTRATE 25 MG PO TABS: 25.0000 mg | ORAL_TABLET | Freq: Two times a day (BID) | ORAL | 3 refills | Status: DC

## 2022-12-03 NOTE — Progress Notes (Signed)
Hematology and Oncology Follow Up Visit  PETE CIOLLI [id number] 1950-07-18 72 y.o. 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 Kall is here today for follow-up.  The main problem that he has right now is atrial fibrillation.  He was found to have this recently.  He went to his family doctor.  His family doctor referred him to cardiology.  He is not scheduled to see cardiology for another 3 weeks.  I called cardiology today.  Dr. Tenny Craw is very kind in trying to get him in next week.  He just is not feeling well.  I am sure this is because his rate is somewhat rapid.  Thankfully, he was already on Xarelto when he developed the atrial fibrillation.  He now is on full dose Xarelto.  Otherwise, he seems to be managing.  He does have problems with his I think the left knee.  He had surgical repair of the left knee.  However, he still having some problems with this.  I think he sees Orthopedic Surgery in a week or so.  He has had no problem with the vitamin B-12 injections.  He gets this monthly.  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  His renal function is managing pretty well.  Overall, I would say that his performance status is probably ECOG 1.     Medications:  Allergies as of 12/03/2022       Reactions   Allopurinol Other (See Comments)   PATIENT PREFERENCE Pt refused due to mom developing steven johnson syndrome.   Penicillins Hives   Has patient had a PCN reaction causing immediate rash, facial/tongue/throat swelling, SOB or lightheadedness with hypotension: Unknown Has patient had a PCN reaction causing severe rash involving mucus membranes or skin necrosis:Unknown Has patient had a PCN reaction that required hospitalization:  No Has patient had a PCN reaction occurring within the last 10 years: No If all of the above answers are "NO", then may proceed with Cephalosporin use.   Sulfamethoxazole Hives   Sulfonamide Derivatives Hives   Dilaudid [hydromorphone Hcl] Nausea And Vomiting        Medication List        Accurate as of Dec 03, 2022 11:40 AM. If you have any questions, ask your nurse or doctor.          STOP taking these medications    aspirin EC 81 MG tablet Stopped by: Josph Macho, MD   diltiazem 2 % Gel Stopped by: Josph Macho, MD   hydroxypropyl methylcellulose / hypromellose 2.5 % ophthalmic solution Commonly known as: ISOPTO TEARS / GONIOVISC Stopped by: Josph Macho, MD   Semaglutide (1 MG/DOSE) 4 MG/3ML Sopn Stopped by: Josph Macho, MD       TAKE these medications    acetaminophen 500 MG tablet Commonly known as: TYLENOL Take 1,000 mg by mouth every 8 (eight) hours as needed for moderate pain or mild pain.   albuterol 108 (90 Base) MCG/ACT inhaler Commonly known as: ProAir HFA INHALE 2 PUFFS EVERY 4  HOURS AS NEEDED FOR  WHEEZING OR SHORTNESS OF  BREATH   Apple Cider Vinegar 500 MG Tabs Take 600 mg by mouth daily.   b  complex vitamins capsule Take 1 capsule by mouth daily.   bisacodyl 5 MG EC tablet Commonly known as: DULCOLAX Take 5 mg by mouth at bedtime.   buPROPion 300 MG 24 hr tablet Commonly known as: WELLBUTRIN XL TAKE 1 TABLET BY MOUTH DAILY   Cholecalciferol 50 MCG (2000 UT) Tabs Take 2,000 Units by mouth daily.   clindamycin 300 MG capsule Commonly known as: CLEOCIN Take 600 mg by mouth once.   clonazePAM 1 MG tablet Commonly known as: KLONOPIN Take 1 tablet (1 mg total) by mouth 2 (two) times daily as needed for anxiety. for anxiety   desvenlafaxine 50 MG 24 hr tablet Commonly known as: PRISTIQ TAKE 1 TABLET BY MOUTH DAILY   diltiazem 240 MG 24 hr capsule Commonly known as: CARDIZEM CD Take 240 mg by mouth daily.    docusate sodium 100 MG capsule Commonly known as: Colace Take 1 capsule (100 mg total) by mouth 2 (two) times daily.   furosemide 20 MG tablet Commonly known as: LASIX TAKE 1 TABLET BY MOUTH  DAILY AS NEEDED   gabapentin 300 MG capsule Commonly known as: NEURONTIN TAKE 1 CAPSULE(300 MG) BY MOUTH AT BEDTIME   GENTEAL MILD OP Apply to eye at bedtime.   prazosin 2 MG capsule Commonly known as: MINIPRESS Take 1 capsule (2 mg total) by mouth at bedtime.   psyllium 58.6 % packet Commonly known as: METAMUCIL Take 1 packet by mouth at bedtime.   rivaroxaban 20 MG Tabs tablet Commonly known as: XARELTO Take 1 tablet (20 mg total) by mouth daily with supper.   tamsulosin 0.4 MG Caps capsule Commonly known as: FLOMAX Take 1 capsule (0.4 mg total) by mouth daily.        Allergies:  Allergies  Allergen Reactions   Allopurinol Other (See Comments)    PATIENT PREFERENCE Pt refused due to mom developing steven johnson syndrome.   Penicillins Hives    Has patient had a PCN reaction causing immediate rash, facial/tongue/throat swelling, SOB or lightheadedness with hypotension: Unknown Has patient had a PCN reaction causing severe rash involving mucus membranes or skin necrosis:Unknown Has patient had a PCN reaction that required hospitalization: No Has patient had a PCN reaction occurring within the last 10 years: No If all of the above answers are "NO", then may proceed with Cephalosporin use.    Sulfamethoxazole Hives   Sulfonamide Derivatives Hives   Dilaudid [Hydromorphone Hcl] Nausea And Vomiting    Past Medical History, Surgical history, Social history, and Family History were reviewed and updated.  Review of Systems: Review of Systems  Constitutional: Negative.   HENT: Negative.    Eyes: Negative.   Respiratory: Negative.    Cardiovascular: Negative.   Gastrointestinal: Negative.   Genitourinary: Negative.   Musculoskeletal: Negative.   Skin: Negative.    Neurological: Negative.   Endo/Heme/Allergies: Negative.   Psychiatric/Behavioral: Negative.       Physical Exam:  height is 6' (1.829 m) and weight is 255 lb (115.7 kg). His oral temperature is 97.9 F (36.6 C). His blood pressure is 113/83 and his pulse is 57 (abnormal). His respiration is 20 and oxygen saturation is 97%.   Wt Readings from Last 3 Encounters:  12/03/22 255 lb (115.7 kg)  11/09/22 255 lb (115.7 kg)  10/13/22 251 lb (113.9 kg)    Physical Exam Vitals reviewed.  HENT:     Head: Normocephalic and atraumatic.  Eyes:     Pupils: Pupils are equal, round, and reactive to light.  Cardiovascular:     Heart sounds: Normal heart sounds.     Comments: Cardiac exam is rapid rate that is irregular.  This is all consistent with atrial fibrillation.  I do not hear any obvious murmurs. Pulmonary:     Effort: Pulmonary effort is normal.     Breath sounds: Normal breath sounds.  Abdominal:     General: Bowel sounds are normal.     Palpations: Abdomen is soft.  Musculoskeletal:        General: No tenderness or deformity. Normal range of motion.     Cervical back: Normal range of motion.  Lymphadenopathy:     Cervical: No cervical adenopathy.  Skin:    General: Skin is warm and dry.     Findings: No erythema or rash.  Neurological:     Mental Status: He is alert and oriented to person, place, and time.  Psychiatric:        Behavior: Behavior normal.        Thought Content: Thought content normal.        Judgment: Judgment normal.      Lab Results  Component Value Date   WBC 5.2 12/03/2022   HGB 14.2 12/03/2022   HCT 43.2 12/03/2022   MCV 95.6 12/03/2022   PLT 164 12/03/2022   Lab Results  Component Value Date   FERRITIN 82 07/14/2022   IRON 61 07/14/2022   TIBC 270 07/14/2022   UIBC 209 07/14/2022   IRONPCTSAT 23 07/14/2022   Lab Results  Component Value Date   RBC 4.52 12/03/2022   No results found for: "KPAFRELGTCHN", "LAMBDASER",  "KAPLAMBRATIO" No results found for: "IGGSERUM", "IGA", "IGMSERUM" No results found for: "TOTALPROTELP", "ALBUMINELP", "A1GS", "A2GS", "BETS", "BETA2SER", "GAMS", "MSPIKE", "SPEI"   Chemistry      Component Value Date/Time   NA 143 12/03/2022 1039   NA 145 06/29/2017 1452   K 4.1 12/03/2022 1039   K 4.7 06/29/2017 1452   CL 112 (H) 12/03/2022 1039   CL 107 06/29/2017 1452   CO2 24 12/03/2022 1039   CO2 25 06/29/2017 1452   BUN 22 12/03/2022 1039   BUN 33 (H) 06/29/2017 1452   CREATININE 1.80 (H) 12/03/2022 1039   CREATININE 1.56 (H) 06/04/2020 0950      Component Value Date/Time   CALCIUM 9.2 12/03/2022 1039   CALCIUM 9.5 06/29/2017 1452   ALKPHOS 75 12/03/2022 1039   ALKPHOS 83 06/29/2017 1452   AST 14 (L) 12/03/2022 1039   ALT 13 12/03/2022 1039   ALT 23 06/29/2017 1452   BILITOT 0.6 12/03/2022 1039       Impression and Plan: Mr. 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   He now is on full dose Xarelto for the atrial fibrillation.  Hopefully, Cardiology will be able to get his rate under better control and hopefully be able to maybe convert him back to a regular rhythm.  He is on full dose Xarelto.  I think he is also on baby aspirin.  I have no problem taking the baby aspirin.  Hopefully, his left knee will get a little bit better.  I would like to see him back in about 2 months or so.  There seems to be a lot going on with him right now.     Josph Macho, MD 5/31/202411:40 AM

## 2022-12-03 NOTE — Patient Instructions (Signed)
Vitamin B12 and Folate Test Why am I having this test? Vitamin B12 and folate (folic acid) are B vitamins needed to make red blood cells and keep your nervous system healthy. Vitamin B12 is in foods such as meats, eggs, dairy products, and fish. Folate is in fruits, beans, and leafy green vegetables. Some foods, such as whole grains, bread, and cereals have vitamin B12 added to them (are fortified). You may not have enough of these B vitamins (have a deficiency) if your diet lacks these vitamins. Low levels can also be caused by diseases or having had surgeries on your stomach or small intestine that interfere with your ability to absorb the vitamins from your food. You may have a vitamin B12 and folate test if: You have symptoms of vitamin B12 or folate deficiency, such as tiredness (fatigue), headache, confusion, poor balance, or tingling and numbness in your hands and feet. You are pregnant or breastfeeding. Women who are pregnant or breastfeeding need more folate and may need to take dietary supplements. Your red blood cell count is low (anemia). You are an older person and have mental confusion. You have a disease or condition that may lead to a deficiency of these B vitamins. What is being tested? This test measures the amount of vitamin B12 and folate in your blood. The tests for vitamin B12 and folate may be done together or separately. What kind of sample is taken?  A blood sample is required for this test. It is usually collected by inserting a needle into a blood vessel. How do I prepare for this test? Follow instructions from your health care provider about eating and drinking before the test. Tell a health care provider about: All medicines you are taking, including vitamins, herbs, eye drops, creams, and over-the-counter medicines. Any medical conditions you have. Whether you are pregnant or may be pregnant. How often you drink alcohol. How are the results reported? Your test  results will be reported as values that identify the amount of vitamin B12 and folate in your blood. Your health care provider will compare your results to normal ranges that were established after testing a large group of people (reference ranges). Reference ranges may vary among labs and hospitals. For this test, common reference ranges are: Vitamin B12: 160-950 pg/mL or 118-701 pmol/L (SI units). Folate: 5-25 ng/mL or 11-57 nmol/L (SI units). What do the results mean? Results within the reference range are considered normal. Vitamin B12 or folate levels that are lower than the reference range may be caused by: Poor nutrition or eating a vegetarian or vegan diet that does not include any foods that come from animals. Having alcoholism. Having certain diseases that make it hard to absorb vitamin B12. These diseases include Crohn's disease, chronic pancreatitis, and cystic fibrosis. Taking certain medicines. Having had surgeries on your stomach or small intestine. High levels of vitamin B12 are rare, but they may happen if you have: Cancer. Liver disease. High levels of folate may happen if: You have anemia. You are vegetarian. You have had a recent blood transfusion. Talk with your health care provider about what your results mean. Questions to ask your health care provider Ask your health care provider, or the department that is doing the test: When will my results be ready? How will I get my results? What are my treatment options? What other tests do I need? What are my next steps? Summary Vitamin B12 and folate (folic acid) are both B vitamins that are needed to  make red blood cells and to keep your nervous system healthy. You may not have enough B vitamins in your body if you do not get enough in your diet or if you have a disease that makes it hard to absorb vitamin B12. This test measures the amount of vitamin B12 and folate in your blood. A blood sample is required for the  test. Talk with your health care provider about what your results mean. This information is not intended to replace advice given to you by your health care provider. Make sure you discuss any questions you have with your health care provider. Document Revised: 02/13/2021 Document Reviewed: 02/13/2021 Elsevier Patient Education  2024 [employer].

## 2022-12-03 NOTE — Progress Notes (Signed)
Cardiology Office Note   Date:  12/03/2022   ID:  Joseph Tran, DOB Mar 18, 1951, MRN [id number]  PCP:  Joseph Salisbury, MD  Cardiologist:   Joseph Pates, MD   Patient presents for evaluation of atrial fibrillation    History of Present Illness: Joseph Tran is a 72 y.o. male with a history of HTN, thromboemboic dz R leg  CKD  (stage III), PAF  he is followed by Claris Che in April  2024  Started on xarelto    Seen by Claris Che again in early May   Clnic note says he was in SR   He was feeling good at that time  No palpitations for several weeks  No EKG done however  Pt referred to cardiology   He has  appt in June with R Revankar  The pt was in heme clnic today  Noted to be in afib with RVR   Referred to cardiology for further evaluation   The pt says he feels exhausted often, very weak  Feels heart racing at times   He will have some days that are better but no days that he feels really energetic       THis has been going on for awhile   It was months since he last felt good He notes occasional dizziness  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   albuterol (PROAIR HFA) 108 (90 Base) MCG/ACT inhaler INHALE 2 PUFFS EVERY 4  HOURS AS NEEDED FOR  WHEEZING OR SHORTNESS OF  BREATH   Apple Cider Vinegar 500 MG TABS Take 600 mg by mouth daily.   b complex vitamins capsule Take 1 capsule by mouth daily.   bisacodyl (DULCOLAX) 5 MG EC tablet Take 5 mg by mouth at bedtime.   buPROPion (WELLBUTRIN XL) 300 MG 24 hr tablet TAKE 1 TABLET BY MOUTH DAILY   Cholecalciferol 50 MCG (2000 UT) TABS Take 2,000 Units by mouth daily.   clindamycin (CLEOCIN) 300 MG capsule Take 600 mg by mouth once.   clonazePAM (KLONOPIN) 1 MG tablet Take 1 tablet (1 mg total) by mouth 2 (two) times daily as  needed for anxiety. for anxiety   desvenlafaxine (PRISTIQ) 50 MG 24 hr tablet TAKE 1 TABLET BY MOUTH DAILY   diltiazem (CARDIZEM CD) 240 MG 24 hr capsule Take 240 mg by mouth daily.   docusate sodium (COLACE) 100 MG capsule Take 1 capsule (100 mg total) by mouth 2 (two) times daily.   furosemide (LASIX) 20 MG tablet TAKE 1 TABLET BY MOUTH  DAILY AS NEEDED   gabapentin (NEURONTIN) 300 MG capsule TAKE 1 CAPSULE(300 MG) BY MOUTH AT BEDTIME   Hypromellose (GENTEAL MILD OP) Apply to eye at bedtime.   prazosin (MINIPRESS) 2 MG capsule Take 1 capsule (2 mg total) by mouth at bedtime.   psyllium (METAMUCIL) 58.6 % packet Take 1 packet by mouth at bedtime.   rivaroxaban (XARELTO) 20 MG TABS tablet Take 1 tablet (20 mg total) by mouth daily with supper.   tamsulosin (FLOMAX) 0.4 MG CAPS capsule Take 1 capsule (0.4 mg total) by mouth daily.     Allergies:   Allopurinol, Penicillins, Sulfamethoxazole, Sulfonamide derivatives, and Dilaudid [hydromorphone hcl]  Past Medical History:  Diagnosis Date   Acoustic neuroma (HCC)    benign - left   Anxiety    Arthritis    Asthma    seasonal, when pollen is high, cold air closes me up   [facility])    skin cancer -- arm, scalp, upper back   Chronic kidney disease    sees Dr. Jaye Beagle at [facility], left kidney is non=functioning.  right kidny is at 50%   Clotting disorder (HCC)    Hx DVT - Xarelto   Colon polyps    Complication of anesthesia    Post op nausea/vomiting   Depression    severe.  dx 1985   DVT (deep venous thrombosis) (HCC)    sees Dr. Arlan Organ    History of kidney stones    has kidney stone now.     Hypertension    Hypogonadism male    sees Dr. Hadley Pen at [facility]   Neuropathy of both feet    OSA (obstructive sleep apnea)    Plantar fasciitis    rt foot   Pneumonia    PONV (postoperative nausea and vomiting)    Renal atrophy, left    sees Dr. Hadley Pen at [facility]   Sleep apnea     tested 2010  - wears c-pap    Past Surgical History:  Procedure Laterality Date   CHEST WALL TUMOR EXCISION  2007   COLONOSCOPY  04/07/2020   per Dr. Rhea Belton, adenomatous polyps, repeat in 3 yrs    kidney caluculs  2004-05   KIDNEY STONE SURGERY     x 2c   KNEE ARTHROSCOPY  09/10/2011   right knee, per Dr. Jodi Geralds    KNEE ARTHROSCOPY Right 04/25/2017   Procedure: RIGHT KNEE ARTHROSCOPY, PARTIAL LATERAL MENISECTOMY, CHONDROPLASTY MEDIAL AND LATERAL PATELLAOFEMORAL;  Surgeon: Jodi Geralds, MD;  Location: [facility] OR;  Service: Orthopedics;  Laterality: Right;   KNEE ARTHROSCOPY Left 09/18/2021   Procedure: ARTHROSCOPY KNEE;  Surgeon: Jodi Geralds, MD;  Location: [facility];  Service: Orthopedics;  Laterality: Left;   KNEE ARTHROSCOPY WITH MEDIAL MENISECTOMY Left 09/18/2021   Procedure: KNEE ARTHROSCOPY WITH MEDIAL MENISECTOMY;  Surgeon: Jodi Geralds, MD;  Location: [facility];  Service: Orthopedics;  Laterality: Left;   madiscus cartilage  2009   MOHS SURGERY     right knee arthroscopy  2008   TONSILLECTOMY     TOTAL KNEE ARTHROPLASTY Right 01/19/2019   Procedure: RIGHT TOTAL KNEE ARTHROPLASTY;  Surgeon: Jodi Geralds, MD;  Location: [facility];  Service: Orthopedics;  Laterality: Right;   TOTAL KNEE ARTHROPLASTY Left 06/14/2022   Procedure: TOTAL KNEE ARTHROPLASTY;  Surgeon: Jodi Geralds, MD;  Location: [facility];  Service: Orthopedics;  Laterality: Left;   UPPER GASTROINTESTINAL ENDOSCOPY       Social History:  The patient  reports that he has never smoked. He has never used smokeless tobacco. He reports that he does not drink alcohol and does not use drugs.   Family History:  The patient's family history includes Heart disease in his father and another family member; Hyperlipidemia in an other family member; Liver cancer in his paternal grandmother; Stroke in an other family member; Sudden death in an other family member; Throat cancer in his maternal grandmother.    ROS:  Please see the history of  present illness. All other systems are reviewed and  Negative to the above problem except as noted.    PHYSICAL EXAM: VS:  BP 114/68   Pulse 73   Ht 6' (1.829 m)   Wt 256 lb 9.6 oz (116.4 kg)   SpO2 96%   BMI 34.80 kg/m   GEN: Well nourished, well developed, in no acute distress  HEENT: normal  Neck: no JVD, carotid bruits, or masses Cardiac:Irreg irreg   No S3  No murmur   No LE  edema  Respiratory:  clear to auscultation bilaterally, GI: soft, nontender  No hepatomegaly  MS: no deformity Moving all extremities      EKG:  EKG is ordered today.  Atrial fibrillation  124 bpm      Lipid Panel    Component Value Date/Time   CHOL 200 07/21/2021 0841   TRIG 129.0 07/21/2021 0841   HDL 47.80 07/21/2021 0841   CHOLHDL 4 07/21/2021 0841   VLDL 25.8 07/21/2021 0841   LDLCALC 126 (H) 07/21/2021 0841   LDLCALC 105 (H) 06/04/2020 0950      Wt Readings from Last 3 Encounters:  12/03/22 256 lb 9.6 oz (116.4 kg)  12/03/22 255 lb (115.7 kg)  11/09/22 255 lb (115.7 kg)        ASSESSMENT AND PLAN:  1  Atrial fibrillation  Pt currently in afib with RVR   On review it has been going on for at least a couple months   By report he was in SR in early MAy  No EKG however   If so, then he is in and out of afib on on (PAF)   In that case cardioversion alone would not make sense.  Would need an antiarrhythmic  Would hate to commit to this without further review  Will add lopressor 25 bid  Will set up for echo    Will start Flecanide 50 mg bid  early next week     Get 7 day ZIo patch to see rate control and to also see  if he is in /out of afib EKG in 10 days from starting flecanide    2  Hx of HTN   BP is controlled  Follow with changes in meds   3 CKD  Will need to be followed  4   Hx of thromboembolic dz leg  Continue Xarelto  Followed in heme clinic    Current medicines are reviewed at length with the patient today.  The patient does not have concerns regarding  medicines.  Signed, Joseph Pates, MD  12/03/2022 2:43 PM    [facility] [address] [place], [place], [place]  [address] Phone: [phone]; Fax: [phone]

## 2022-12-03 NOTE — Telephone Encounter (Signed)
PLease see note that I finished  Start flecanide 50 bid  Get EKG in 10 days from starting flecanide Set up for Zio patch   7 day to see if in/out of afib

## 2022-12-03 NOTE — Patient Instructions (Addendum)
Medication Instructions:  START LOPRESSOR (METOPROLOL TARTRATE) 25 MG TWICE A DAY  *If you need a refill on your cardiac medications before your next appointment, please call your pharmacy*   Lab Work:  If you have labs (blood work) drawn today and your tests are completely normal, you will receive your results only by: MyChart Message (if you have MyChart) OR A paper copy in the mail If you have any lab test that is abnormal or we need to change your treatment, we will call you to review the results.   Testing/Procedures: Your physician has requested that you have an echocardiogram. Echocardiography is a painless test that uses sound waves to create images of your heart. It provides your doctor with information about the size and shape of your heart and how well your heart's chambers and valves are working. This procedure takes approximately one hour. There are no restrictions for this procedure. Please do NOT wear cologne, perfume, aftershave, or lotions (deodorant is allowed). Please arrive 15 minutes prior to your appointment time.    Follow-Up: WILL PLAN AFTER TESTING.... KEEP ALL FUTURE VISITS  At [facility], you and your health needs are our priority.  As part of our continuing mission to provide you with exceptional heart care, we have created designated Provider Care Teams.  These Care Teams include your primary Cardiologist (physician) and Advanced Practice Providers (APPs -  Physician Assistants and Nurse Practitioners) who all work together to provide you with the care you need, when you need it.  We recommend signing up for the patient portal called "MyChart".  Sign up information is provided on this After Visit Summary.  MyChart is used to connect with patients for Virtual Visits (Telemedicine).  Patients are able to view lab/test results, encounter notes, upcoming appointments, etc.  Non-urgent messages can be sent to your provider as well.   To learn more about what  you can do with MyChart, go to [email or web address].

## 2022-12-06 ENCOUNTER — Ambulatory Visit: Payer: Medicare Other | Attending: Internal Medicine

## 2022-12-06 DIAGNOSIS — I4811 Longstanding persistent atrial fibrillation: Secondary | ICD-10-CM

## 2022-12-06 MED ORDER — FLECAINIDE ACETATE 50 MG PO TABS: 50.0000 mg | ORAL_TABLET | Freq: Two times a day (BID) | ORAL | 3 refills | Status: DC

## 2022-12-06 NOTE — Addendum Note (Signed)
Addended by: Bertram Millard on: 12/06/2022 01:31 PM   Modules accepted: Orders

## 2022-12-06 NOTE — Telephone Encounter (Signed)
Left a message for the pt to call back.  

## 2022-12-06 NOTE — Progress Notes (Unsigned)
Enrolled for [employer] to mail a ZIO XT long term holter monitor to the patients address on file.  

## 2022-12-06 NOTE — Telephone Encounter (Addendum)
I spoke with the pt and he agrees and verbalized understanding.. he will call and let us know how he is doing... he made an appt to come back and see Dr Tenny Craw in July. Zio ordered.

## 2022-12-11 DIAGNOSIS — I4811 Longstanding persistent atrial fibrillation: Secondary | ICD-10-CM

## 2022-12-13 ENCOUNTER — Telehealth: Payer: Self-pay | Admitting: Internal Medicine

## 2022-12-13 NOTE — Telephone Encounter (Signed)
Pt called to c/o feeling very weak and tired since starting the new meds... Lopressor and Flecainide... he says it started with the Lopressor... his BP today 135/84 and HR 90... pt has appt his week 6/13/254 for EKG... he does not think he can maintain theses doses feeling as "washed out" as he is. He denies palpitations, dizziness and SOB.   Will forward to Dr Tenny Craw.

## 2022-12-13 NOTE — Telephone Encounter (Addendum)
Pt to come in for a nurse visit per Dr Tenny Craw 12/14/22.

## 2022-12-13 NOTE — Telephone Encounter (Signed)
Pt c/o medication issue:  1. Name of Medication: metoprolol tartrate (LOPRESSOR) 25 MG tablet  flecainide (TAMBOCOR) 50 MG tablet   2. How are you currently taking this medication (dosage and times per day)? As prescribed   3. Are you having a reaction (difficulty breathing--STAT)? Yes   4. What is your medication issue? Patient is requesting call back to discuss this med change. He states that he is feeling weak, dizzy and overall not well after starting this meds. Please advise.

## 2022-12-13 NOTE — Telephone Encounter (Signed)
COme in tomorrow for EKG and BP check

## 2022-12-14 ENCOUNTER — Ambulatory Visit: Payer: Medicare Other | Attending: Cardiovascular Disease

## 2022-12-14 VITALS — BP 130/86 | HR 99 | Ht 72.0 in | Wt 257.0 lb

## 2022-12-14 DIAGNOSIS — I4811 Longstanding persistent atrial fibrillation: Secondary | ICD-10-CM | POA: Insufficient documentation

## 2022-12-14 NOTE — Progress Notes (Unsigned)
   Nurse Visit   Date of Encounter: 12/14/2022 ID: Joseph Tran, DOB [birth year], MRN [id number]  PCP:  Nelwyn Salisbury, MD   [facility] Providers Cardiologist:  Dr. Tenny Craw   Visit Details   BP 130/86 (BP Location: Right Arm, Patient Position: Sitting)   Pulse 99   Ht 6' (1.829 m)   Wt 257 lb (116.6 kg)   SpO2 97%   BMI 34.86 kg/m   Wt Readings from Last 3 Encounters:  12/14/22 257 lb (116.6 kg)  12/03/22 256 lb 9.6 oz (116.4 kg)  12/03/22 255 lb (115.7 kg)     Reason for visit: EKG for Flecainide and metoprolol. Performed today: Vitals, EKG, Provider consulted:Dr. Nahser (DOD), and Education Changes (medications, testing, etc.) : none Length of Visit: 20 minutes 5.   Patient reports feeling weak and sometimes lightheaded since starting metoprolol and flecainide. Patient states that the metoprolol which he started several days prior to starting flecainide made him feel "washed out and tired".    Medications Adjustments/Labs and Tests Ordered: Orders Placed This Encounter  Procedures   EKG 12-Lead   No orders of the defined types were placed in this encounter. Per Dr Elease Hashimoto, will continue to monitor and let patient follow up with Dr Tenny Craw to address his medications and treatment plan.    Signed, Eilleen Kempf, RN  12/14/2022 3:33 PM

## 2022-12-14 NOTE — Patient Instructions (Signed)
Medication Instructions:  Your physician recommends that you continue on your current medications as directed. Please refer to the Current Medication list given to you today.  *If you need a refill on your cardiac medications before your next appointment, please call your pharmacy*   Follow-Up: At [facility], you and your health needs are our priority.  As part of our continuing mission to provide you with exceptional heart care, we have created designated Provider Care Teams.  These Care Teams include your primary Cardiologist (physician) and Advanced Practice Providers (APPs -  Physician Assistants and Nurse Practitioners) who all work together to provide you with the care you need, when you need it.  Your next appointment:   01/05/2023  Provider:   Dr. Tenny Craw

## 2022-12-15 ENCOUNTER — Ambulatory Visit: Payer: Medicare Other | Attending: Orthopedic Surgery | Admitting: Physical Therapy

## 2022-12-15 ENCOUNTER — Encounter: Payer: Self-pay | Admitting: Physical Therapy

## 2022-12-15 DIAGNOSIS — M542 Cervicalgia: Secondary | ICD-10-CM | POA: Diagnosis not present

## 2022-12-15 DIAGNOSIS — R252 Cramp and spasm: Secondary | ICD-10-CM | POA: Insufficient documentation

## 2022-12-15 NOTE — Therapy (Signed)
OUTPATIENT PHYSICAL THERAPY CERVICAL EVALUATION   Patient Name: Joseph Tran MRN: [id number] DOB:09-21-1950, 72 y.o., male Today's Date: 12/15/2022  END OF SESSION:  PT End of Session - 12/15/22 0828     Visit Number 1    Number of Visits 12    Date for PT Re-Evaluation 01/26/23    Authorization Type Medicare + [employer]    PT Start Time 0845    PT Stop Time 0930    PT Time Calculation (min) 45 min    Activity Tolerance Patient tolerated treatment well    Behavior During Therapy WFL for tasks assessed/performed             Past Medical History:  Diagnosis Date   Acoustic neuroma (HCC)    benign - left   Anxiety    Arthritis    Asthma    seasonal, when pollen is high, cold air closes me up   Cancer (HCC)    skin cancer -- arm, scalp, upper back   Chronic kidney disease    sees Dr. Jaye Beagle at [facility], left kidney is non=functioning.  right kidny is at 50%   Clotting disorder (HCC)    Hx DVT - Xarelto   Colon polyps    Complication of anesthesia    Post op nausea/vomiting   Depression    severe.  dx 1985   DVT (deep venous thrombosis) (HCC)    sees Dr. Arlan Organ    History of kidney stones    has kidney stone now.     Hypertension    Hypogonadism male    sees Dr. Hadley Pen at [facility]   Neuropathy of both feet    OSA (obstructive sleep apnea)    Plantar fasciitis    rt foot   Pneumonia    PONV (postoperative nausea and vomiting)    Renal atrophy, left    sees Dr. Hadley Pen at [facility]   Sleep apnea    tested 2010  - wears c-pap   Past Surgical History:  Procedure Laterality Date   CHEST WALL TUMOR EXCISION  2007   COLONOSCOPY  04/07/2020   per Dr. Rhea Belton, adenomatous polyps, repeat in 3 yrs    kidney caluculs  2004-05   KIDNEY STONE SURGERY     x 2c   KNEE ARTHROSCOPY  09/10/2011   right knee, per Dr. Jodi Geralds    KNEE ARTHROSCOPY Right 04/25/2017   Procedure: RIGHT KNEE ARTHROSCOPY, PARTIAL LATERAL  MENISECTOMY, CHONDROPLASTY MEDIAL AND LATERAL PATELLAOFEMORAL;  Surgeon: Jodi Geralds, MD;  Location: [facility] OR;  Service: Orthopedics;  Laterality: Right;   KNEE ARTHROSCOPY Left 09/18/2021   Procedure: ARTHROSCOPY KNEE;  Surgeon: Jodi Geralds, MD;  Location: [facility];  Service: Orthopedics;  Laterality: Left;   KNEE ARTHROSCOPY WITH MEDIAL MENISECTOMY Left 09/18/2021   Procedure: KNEE ARTHROSCOPY WITH MEDIAL MENISECTOMY;  Surgeon: Jodi Geralds, MD;  Location: [facility];  Service: Orthopedics;  Laterality: Left;   madiscus cartilage  2009   MOHS SURGERY     right knee arthroscopy  2008   TONSILLECTOMY     TOTAL KNEE ARTHROPLASTY Right 01/19/2019   Procedure: RIGHT TOTAL KNEE ARTHROPLASTY;  Surgeon: Jodi Geralds, MD;  Location: [facility];  Service: Orthopedics;  Laterality: Right;   TOTAL KNEE ARTHROPLASTY Left 06/14/2022   Procedure: TOTAL KNEE ARTHROPLASTY;  Surgeon: Jodi Geralds, MD;  Location: [facility];  Service: Orthopedics;  Laterality: Left;   UPPER GASTROINTESTINAL ENDOSCOPY     Patient Active  Problem List   Diagnosis Date Noted   Insomnia 11/09/2022   Paroxysmal atrial fibrillation (HCC) 11/09/2022   Vitamin B 12 deficiency 07/14/2022   Preoperative respiratory examination 04/26/2022   Morbid obesity (HCC) 03/26/2022   Primary osteoarthritis of left knee 09/18/2021   Plica of knee, left 09/18/2021   Complex tear of lat mensc, current injury, left knee, init 09/18/2021   Acute meniscal tear, medial, left, initial encounter 09/18/2021   Hyperglycemia 05/30/2019   Primary osteoarthritis of right knee 01/19/2019   CKD (chronic kidney disease), stage III (HCC) 09/13/2017   Renal atrophy, left 09/13/2017   Systemic lupus erythematosus (HCC) 09/13/2017   Dyslipidemia 09/13/2017   Acute meniscal tear, lateral, right, initial encounter 04/25/2017   Osteoarthritis of right knee 04/25/2017   Concussion with loss of consciousness 08/05/2015   Laceration of spleen 08/05/2015   Lumbar stress  fracture 08/05/2015   DVT (deep venous thrombosis) (HCC) 10/18/2014   Hx of bacterial pneumonia 03/10/2013   COLONIC POLYPS, HX OF 04/08/2010   NEPHROLITHIASIS, HX OF 04/08/2010   ACOUSTIC NEUROMA 04/07/2010   Hypogonadism male 04/07/2010   Depression with anxiety 04/07/2010   SLEEP APNEA, OBSTRUCTIVE 04/07/2010   Asthma 04/07/2010   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   ERECTILE DYSFUNCTION, 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   PLANTAR FASCIITIS 04/07/2010    PCP: Nelwyn Salisbury, MD   REFERRING PROVIDER: Jodi Geralds, MD  REFERRING DIAG: M54.2 (ICD-10-CM) - Neck pain  THERAPY DIAG:  Cervicalgia  Cramp and spasm  Rationale for Evaluation and Treatment: Rehabilitation  ONSET DATE: Mid April 2024  SUBJECTIVE:                                                                                                                                                                                                         SUBJECTIVE STATEMENT: Patient reports he fell backwards and hasn't been right since.  His shoe came untied, he propped his leg up to tie it, and his heel got caught and fell backwards, caught himself on his hands, when he woke up that morning couldn't move his neck.  It's better but he has hit a plateau.  He tried the tizanidine from his knee replacement, but they didn't help.  The Dr. Rochele Pages x-rays, said there was some "deterioration" but implied it was normal wear and tear.  Currently being checked for Afib and wearing heart monitor through Saturday.    Hand dominance: Right  PERTINENT HISTORY:  R TKA 01/19/19; L knee arthroscopy with medial menisectomy 09/2021; Lupus; L acoustic neuroma - pt reports minor balance  issues related to tumor; asthma; skin cancer; morbid obesity; CKD-III; DVT 2016; anxiety; depression.  Possible A-Fib.   PAIN:  Are you having pain? Yes: NPRS scale: 5/10 Pain location: posterior neck  Pain description: achey, headaches Aggravating factors: turn neck too fast  or far Relieving factors: Tylenol, heat, ice, hot showers, liniment but nothing lasts  PRECAUTIONS: Other: Possible Afib  WEIGHT BEARING RESTRICTIONS: No  FALLS:  Has patient fallen in last 6 months? Yes. 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 Luiz Blare  OBJECTIVE:   DIAGNOSTIC FINDINGS:  08/02/2015 CT Spine Cervical .  Alignment: Trace anterolisthesis of C2 on C3 is favored to be degenerative in nature, as well as trace anterolisthesis of C7 on T1.  .  Craniocervical junction: No evidence of fracture or dislocation.  .  Vertebrae: No acute fractures. Vertebral body heights maintained.  [name]  Spinal canal: No significant bony canal stenosis. No gross upper cervical canal hematoma.  .  Degenerative changes: Multilevel degenerative disc and facet disease, with disc disease most advanced spanning C5-C7 with posterior disc osteophyte complex formation and uncovertebral joint spurring resulting in varying degrees of spinal canal and neural foraminal narrowing. Most advanced degenerative facet disease is present on the right at C2-C3.   PATIENT SURVEYS:  NDI 13/50= 26%  COGNITION: Overall cognitive status: Within functional limits for tasks assessed  SENSATION: WFL  POSTURE:  decreased cervical lordosis  PALPATION: Tenderness/tightness throughout suboccipitals, cervical paraspinals and multifidi  CERVICAL ROM:   Active ROM AROM (deg) eval   Flexion 40  Extension 47  Right lateral flexion 15p!  Left lateral flexion 10p!  Right rotation 24p!  Left rotation 32p!   (Blank rows = not tested)  UPPER EXTREMITY ROM:  WNL, no pain   UPPER EXTREMITY MMT:  5/5 all mytomes, no pain. Good grip strength bil.   TODAY'S TREATMENT:                                                                                                                              DATE:   Self Care: Education on findings, POC, TrDN, initial HEP  Chin tucks Self snags with pillow case   PATIENT EDUCATION:  Education details: see self care, TrDN Person educated: Patient Education method: Explanation, Demonstration, Verbal cues, and Handouts Education comprehension: verbalized understanding and returned demonstration  HOME EXERCISE PROGRAM: Access Code: [email or web address] URL: [email or web address] Date: 12/15/2022 Prepared by: Harrie Foreman  Exercises - Seated Cervical Retraction  - 3 x daily - 7 x weekly - 1 sets - 10 reps - Cervical Extension AROM with Strap  - 3 x daily - 7 x weekly - 1 sets - 10 reps  ASSESSMENT:  CLINICAL IMPRESSION: Patient is a 72 y.o. male who was seen today for physical therapy evaluation and treatment for neck pain.  He reports muscle spasms in posterior neck starting when he fell backwards catching himself of hands but jarring his neck in mid April, and continues to have spasm, pain and restricted AROM in his neck as well as headache, reporting 26% impairment on NDI, and demonstrates significant spasm in his suboccipital and cervical paraspinal musculature.  Karen Kitchens would benefit from skilled physical therapy to decrease pain and spasm, restore his AROM in his neck to improve safety with driving and allow return to all activities with good safety.      OBJECTIVE IMPAIRMENTS: decreased mobility, hypomobility, increased fascial restrictions, increased muscle spasms, postural dysfunction, and pain.   ACTIVITY  LIMITATIONS: lifting, bending, and sleeping  PARTICIPATION LIMITATIONS: driving, shopping, and community activity  PERSONAL FACTORS: Age and 3+ comorbidities: R TKA 01/19/19; L knee arthroscopy with medial menisectomy 09/2021; Lupus; L acoustic neuroma; asthma; skin cancer; morbid obesity; CKD-III; DVT 2016; anxiety; depression; arthritis   are also affecting patient's functional outcome.   REHAB POTENTIAL: Good  CLINICAL DECISION MAKING: Evolving/moderate complexity  EVALUATION COMPLEXITY: Moderate   GOALS: Goals reviewed with patient? Yes  SHORT TERM GOALS: Target date: 12/29/2022   Patient will be independent with initial HEP.  Baseline:  Goal status: INITIAL   LONG TERM GOALS: Target date: 01/26/2023   Patient will be independent with advanced/ongoing HEP to improve outcomes and carryover.  Baseline:   Goal status: INITIAL  2.  Patient will report 75% improvement in neck pain to improve QOL.  Baseline:   Goal status: INITIAL  3.  Patient will demonstrate full pain free cervical ROM for safety with driving.  Baseline: see objective Goal status: INITIAL  4.  Patient will report 7.5 points or more improvement  on QuickDash to demonstrate improved functional ability.  Baseline: 13/50 Goal status: INITIAL  PLAN:  PT FREQUENCY: 1-2x/week  PT DURATION: 6 weeks  PLANNED INTERVENTIONS: Therapeutic exercises, Therapeutic activity, Neuromuscular re-education, Balance training, Gait training, Patient/Family education, Self Care, Joint mobilization, Dry Needling, Electrical stimulation, Spinal manipulation, Spinal mobilization, Cryotherapy, Moist heat, Traction, Ultrasound, Manual therapy, and Re-evaluation  PLAN FOR NEXT SESSION: review and progress exercises, manual therapy including TrDN   Jena Gauss, PT, DPT 12/15/2022, 12:10 PM

## 2022-12-16 ENCOUNTER — Ambulatory Visit: Payer: Medicare Other

## 2022-12-21 ENCOUNTER — Ambulatory Visit: Payer: Medicare Other

## 2022-12-21 DIAGNOSIS — Z96652 Presence of left artificial knee joint: Secondary | ICD-10-CM | POA: Diagnosis not present

## 2022-12-21 DIAGNOSIS — M542 Cervicalgia: Secondary | ICD-10-CM | POA: Diagnosis not present

## 2022-12-21 DIAGNOSIS — Z471 Aftercare following joint replacement surgery: Secondary | ICD-10-CM | POA: Diagnosis not present

## 2022-12-22 ENCOUNTER — Ambulatory Visit: Payer: Medicare Other | Admitting: Physical Therapy

## 2022-12-22 DIAGNOSIS — R252 Cramp and spasm: Secondary | ICD-10-CM | POA: Diagnosis not present

## 2022-12-22 DIAGNOSIS — M542 Cervicalgia: Secondary | ICD-10-CM | POA: Diagnosis not present

## 2022-12-22 NOTE — Therapy (Signed)
OUTPATIENT PHYSICAL THERAPY TREATMENT   Patient Name: [name] MRN: [id number] DOB:06-18-1951, 72 y.o., male Today's Date: 12/22/2022  END OF SESSION:  PT End of Session - 12/22/22 0848     Visit Number 2    Number of Visits 12    Date for PT Re-Evaluation 01/26/23    Authorization Type Medicare + [employer]    PT Start Time 0848    PT Stop Time 0930    PT Time Calculation (min) 42 min    Activity Tolerance Patient tolerated treatment well    Behavior During Therapy WFL for tasks assessed/performed             Past Medical History:  Diagnosis Date   Acoustic neuroma (HCC)    benign - left   Anxiety    Arthritis    Asthma    seasonal, when pollen is high, cold air closes me up   Cancer (HCC)    skin cancer -- arm, scalp, upper back   Chronic kidney disease    sees Dr. Jaye Beagle at [facility], left kidney is non=functioning.  right kidny is at 50%   Clotting disorder (HCC)    Hx DVT - Xarelto   Colon polyps    Complication of anesthesia    Post op nausea/vomiting   Depression    severe.  dx 1985   DVT (deep venous thrombosis) (HCC)    sees Dr. Arlan Organ    History of kidney stones    has kidney stone now.     Hypertension    Hypogonadism male    sees Dr. Hadley Pen at [facility]   Neuropathy of both feet    OSA (obstructive sleep apnea)    Plantar fasciitis    rt foot   Pneumonia    PONV (postoperative nausea and vomiting)    Renal atrophy, left    sees Dr. Hadley Pen at [facility]   Sleep apnea    tested 2010  - wears c-pap   Past Surgical History:  Procedure Laterality Date   CHEST WALL TUMOR EXCISION  2007   COLONOSCOPY  04/07/2020   per Dr. Rhea Belton, adenomatous polyps, repeat in 3 yrs    kidney caluculs  2004-05   KIDNEY STONE SURGERY     x 2c   KNEE ARTHROSCOPY  09/10/2011   right knee, per Dr. Jodi Geralds    KNEE ARTHROSCOPY Right 04/25/2017   Procedure: RIGHT KNEE ARTHROSCOPY, PARTIAL LATERAL MENISECTOMY,  CHONDROPLASTY MEDIAL AND LATERAL PATELLAOFEMORAL;  Surgeon: Jodi Geralds, MD;  Location: [facility] OR;  Service: Orthopedics;  Laterality: Right;   KNEE ARTHROSCOPY Left 09/18/2021   Procedure: ARTHROSCOPY KNEE;  Surgeon: Jodi Geralds, MD;  Location: [facility];  Service: Orthopedics;  Laterality: Left;   KNEE ARTHROSCOPY WITH MEDIAL MENISECTOMY Left 09/18/2021   Procedure: KNEE ARTHROSCOPY WITH MEDIAL MENISECTOMY;  Surgeon: Jodi Geralds, MD;  Location: [facility];  Service: Orthopedics;  Laterality: Left;   madiscus cartilage  2009   MOHS SURGERY     right knee arthroscopy  2008   TONSILLECTOMY     TOTAL KNEE ARTHROPLASTY Right 01/19/2019   Procedure: RIGHT TOTAL KNEE ARTHROPLASTY;  Surgeon: Jodi Geralds, MD;  Location: [facility];  Service: Orthopedics;  Laterality: Right;   TOTAL KNEE ARTHROPLASTY Left 06/14/2022   Procedure: TOTAL KNEE ARTHROPLASTY;  Surgeon: Jodi Geralds, MD;  Location: [facility];  Service: Orthopedics;  Laterality: Left;   UPPER GASTROINTESTINAL ENDOSCOPY     Patient Active Problem  List   Diagnosis Date Noted   Insomnia 11/09/2022   Paroxysmal atrial fibrillation (HCC) 11/09/2022   Vitamin B 12 deficiency 07/14/2022   Preoperative respiratory examination 04/26/2022   Morbid obesity (HCC) 03/26/2022   Primary osteoarthritis of left knee 09/18/2021   Plica of knee, left 09/18/2021   Complex tear of lat mensc, current injury, left knee, init 09/18/2021   Acute meniscal tear, medial, left, initial encounter 09/18/2021   Hyperglycemia 05/30/2019   Primary osteoarthritis of right knee 01/19/2019   CKD (chronic kidney disease), stage III (HCC) 09/13/2017   Renal atrophy, left 09/13/2017   Systemic lupus erythematosus (HCC) 09/13/2017   Dyslipidemia 09/13/2017   Acute meniscal tear, lateral, right, initial encounter 04/25/2017   Osteoarthritis of right knee 04/25/2017   Concussion with loss of consciousness 08/05/2015   Laceration of spleen 08/05/2015   Lumbar stress fracture 08/05/2015    DVT (deep venous thrombosis) (HCC) 10/18/2014   Hx of bacterial pneumonia 03/10/2013   COLONIC POLYPS, HX OF 04/08/2010   NEPHROLITHIASIS, HX OF 04/08/2010   ACOUSTIC NEUROMA 04/07/2010   Hypogonadism male 04/07/2010   Depression with anxiety 04/07/2010   SLEEP APNEA, OBSTRUCTIVE 04/07/2010   Asthma 04/07/2010   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   ERECTILE DYSFUNCTION, 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   PLANTAR FASCIITIS 04/07/2010    PCP: Nelwyn Salisbury, MD   REFERRING PROVIDER: Jodi Geralds, MD  REFERRING DIAG: M54.2 (ICD-10-CM) - Neck pain  THERAPY DIAG:  Cervicalgia  Cramp and spasm  Rationale for Evaluation and Treatment: Rehabilitation  ONSET DATE: Mid April 2024  SUBJECTIVE:                                                                                                                                                                                                         SUBJECTIVE STATEMENT: Pt reports he always has pain in his neck since the fall, no headache today. He notes he is also a little dizzy today - currently being worked up for afib and states 2 of the meds he is taking to control the HR have side effects of dizziness. This usually lasts for 1-1.5 hrs after taking the dose of afib meds. 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  Hand dominance: Right  PERTINENT HISTORY:  R TKA 01/19/19; L knee arthroscopy with medial menisectomy 09/2021; Lupus; L acoustic neuroma - pt reports minor balance issues related to tumor; asthma; skin cancer; morbid obesity; CKD-III; DVT 2016; anxiety; depression.  Possible A-Fib.   PAIN:  Are you having pain?  Yes: NPRS scale: 3/10 Pain location: posterior neck  Pain description: achey, headaches Aggravating factors: turn neck too fast or far Relieving factors: Tylenol, heat, ice, hot showers, liniment but nothing lasts  PRECAUTIONS: Other: Possible Afib  WEIGHT BEARING RESTRICTIONS: No  FALLS:  Has  patient fallen in last 6 months? Yes. 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 Luiz Blare  OBJECTIVE:   DIAGNOSTIC FINDINGS:  08/02/2015 CT Spine Cervical .  Alignment: Trace anterolisthesis of C2 on C3 is favored to be degenerative in nature, as well as trace anterolisthesis of C7 on T1.  .  Craniocervical junction: No evidence of fracture or dislocation.  .  Vertebrae: No acute fractures. Vertebral body heights maintained.  [name]  Spinal canal: No significant bony canal stenosis. No gross upper cervical canal hematoma.  .  Degenerative changes: Multilevel degenerative disc and facet disease, with disc disease most advanced spanning C5-C7 with posterior disc osteophyte complex formation and uncovertebral joint spurring resulting in varying degrees of spinal canal and neural foraminal narrowing. Most advanced degenerative facet disease is present on the right at C2-C3.   PATIENT SURVEYS:  NDI 13/50= 26%  COGNITION: Overall cognitive status: Within functional limits for tasks assessed  SENSATION: WFL  POSTURE:  decreased cervical lordosis  PALPATION: Tenderness/tightness throughout suboccipitals, cervical paraspinals and multifidi  CERVICAL ROM:   Active ROM AROM (deg) eval  Flexion 40  Extension 47  Right lateral flexion 15p!  Left lateral flexion 10p!  Right rotation 24p!  Left rotation 32p!   (Blank rows = not tested)  UPPER EXTREMITY  ROM:  WNL, no pain   UPPER EXTREMITY MMT:  5/5 all mytomes, no pain. Good grip strength bil.   TODAY'S TREATMENT:                                                                                                                              DATE:   12/22/22 THERAPEUTIC EXERCISE: to improve flexibility, strength and mobility.  Demonstration, verbal and tactile cues throughout for technique. UBE - L1.0 x 6 min (3' each fwd & back)  MANUAL THERAPY: To promote normalized muscle tension, improved flexibility, improved joint mobility, increased ROM, and reduced pain. Skilled palpation and monitoring of soft tissue during DN Trigger Point Dry-Needling  Treatment instructions: Expect mild to moderate muscle soreness. S/S of pneumothorax if dry needled over a lung field, and to seek immediate medical attention should they  occur. Patient verbalized understanding of these instructions and education. Patient Consent Given: Yes Education handout provided: Previously provided Muscles treated: B suboccipitals, cervical multifidi and paraspinals  Electrical stimulation performed: No Parameters: N/A Treatment response/outcome: Twitch Response Elicited and Palpable Increase in Muscle Length STM/DTM, manual TPR and pin & stretch to muscles addressed with DN B suboccipital release x 3 min Gentle cervical distraction 3 x 30" Gentle cervical distraction with PROM through all planes with pause for gentle stretch at end ROM   12/15/22 Self Care: Education on findings, POC, TrDN, initial HEP  Chin tucks Self snags with pillow case    PATIENT EDUCATION:  Education details: role of DN and DN rational, procedure, outcomes, potential side effects, and recommended post-treatment exercises/activity  Person educated: Patient Education method: Explanation Education comprehension: verbalized understanding  HOME EXERCISE PROGRAM: Access Code: [email or web address] URL: [email or web address] Date:  12/15/2022 Prepared by: Harrie Foreman  Exercises - Seated Cervical Retraction  - 3 x daily - 7 x weekly - 1 sets - 10 reps - Cervical Extension AROM with Strap  - 3 x daily - 7 x weekly - 1 sets - 10 reps  ASSESSMENT:  CLINICAL IMPRESSION: Denzel "Ron" arrives to PT noting some dizziness which attributes to the side effects of the meds he has started for his recently diagnosed afib. He expressed interest in proceeding with the DN discussed on his initial eval.  After explanation of DN rational, procedures, outcomes and potential side effects, patient verbalized consent to DN treatment in conjunction with manual STM/DTM and TPR to reduce ttp/muscle tension. Muscles treated as indicated above. DN produced normal response with good twitches elicited resulting in palpable reduction in pain/ttp and muscle tension, with slight improvement noted in cervical A/PROM following MT and DN. Pt educated to expect mild to moderate muscle soreness for up to 24-48 hrs and instructed to continue prescribed HEP and current activity level with pt verbalizing understanding of these instructions.   OBJECTIVE IMPAIRMENTS: decreased mobility, hypomobility, increased fascial restrictions, increased muscle spasms, postural dysfunction, and pain.   ACTIVITY LIMITATIONS: lifting, bending, and sleeping  PARTICIPATION LIMITATIONS: driving, shopping, and community activity  PERSONAL FACTORS: Age and 3+ comorbidities: R TKA 01/19/19; L knee arthroscopy with medial menisectomy 09/2021; Lupus; L acoustic neuroma; asthma; skin cancer; morbid obesity; CKD-III; DVT 2016; anxiety; depression; arthritis   are also affecting patient's functional outcome.   REHAB POTENTIAL: Good  CLINICAL DECISION MAKING: Evolving/moderate complexity  EVALUATION COMPLEXITY: Moderate   GOALS: Goals reviewed with patient? Yes  SHORT TERM GOALS: Target date: 12/29/2022   Patient will be independent with initial HEP.  Baseline:  Goal status:  IN PROGRESS   LONG TERM GOALS: Target date: 01/26/2023   Patient will be independent with advanced/ongoing HEP to improve outcomes and carryover.  Baseline:   Goal status: IN PROGRESS  2.  Patient will report 75% improvement in neck pain to improve QOL.  Baseline:   Goal status: IN PROGRESS  3.  Patient will demonstrate full pain free cervical ROM for safety with driving.  Baseline: see objective Goal status: IN PROGRESS  4.  Patient will report 7.5 points or more improvement  on QuickDash to demonstrate improved functional ability.  Baseline: 13/50 Goal status: IN PROGRESS  PLAN:  PT FREQUENCY: 1-2x/week  PT DURATION: 6 weeks  PLANNED INTERVENTIONS: Therapeutic exercises, Therapeutic activity, Neuromuscular re-education, Balance training, Gait training, Patient/Family education, Self Care, Joint mobilization, Dry Needling, Electrical stimulation, Spinal manipulation, Spinal mobilization, Cryotherapy, Moist heat, Traction, Ultrasound, Manual therapy, and Re-evaluation  PLAN FOR NEXT SESSION: Assess response to DN, review and progress exercises, manual therapy including TrDN as indicated and benefit noted   Marry Guan, PT 12/22/2022, 11:25 AM

## 2022-12-23 ENCOUNTER — Ambulatory Visit (HOSPITAL_BASED_OUTPATIENT_CLINIC_OR_DEPARTMENT_OTHER)
Admission: RE | Admit: 2022-12-23 | Discharge: 2022-12-23 | Disposition: A | Discharge status: Home / Self Care | Payer: Medicare Other | Source: Ambulatory Visit | Attending: Family Medicine | Admitting: Family Medicine

## 2022-12-23 DIAGNOSIS — I4811 Longstanding persistent atrial fibrillation: Secondary | ICD-10-CM | POA: Insufficient documentation

## 2022-12-23 DIAGNOSIS — G4730 Sleep apnea, unspecified: Secondary | ICD-10-CM | POA: Diagnosis not present

## 2022-12-23 DIAGNOSIS — I4819 Other persistent atrial fibrillation: Secondary | ICD-10-CM | POA: Diagnosis not present

## 2022-12-23 DIAGNOSIS — I129 Hypertensive chronic kidney disease with stage 1 through stage 4 chronic kidney disease, or unspecified chronic kidney disease: Secondary | ICD-10-CM | POA: Diagnosis not present

## 2022-12-23 DIAGNOSIS — I340 Nonrheumatic mitral (valve) insufficiency: Secondary | ICD-10-CM | POA: Diagnosis not present

## 2022-12-23 LAB — ECHOCARDIOGRAM COMPLETE
AR max vel: 2.43 cm2
AV Area VTI: 2.35 cm2
AV Area mean vel: 2.53 cm2
AV Mean grad: 4.3 mmHg
AV Peak grad: 8.5 mmHg
Ao pk vel: 1.46 m/s
Area-P 1/2: 10.68 cm2
Calc EF: 47.8 %
MV M vel: 3.54 m/s
MV Peak grad: 50.1 mmHg
S' Lateral: 4.4 cm
Single Plane A2C EF: 46.8 %
Single Plane A4C EF: 47.2 %

## 2022-12-24 ENCOUNTER — Telehealth: Payer: Self-pay

## 2022-12-24 ENCOUNTER — Ambulatory Visit: Payer: Medicare Other | Admitting: Cardiology

## 2022-12-24 MED ORDER — FLECAINIDE ACETATE 50 MG PO TABS: 75.0000 mg | ORAL_TABLET | Freq: Two times a day (BID) | ORAL | 0 refills | Status: DC

## 2022-12-24 NOTE — Telephone Encounter (Signed)
The patient has been notified of the result and verbalized understanding.  All questions (if any) were answered.  Message sent to Dr Tenny Craw to call him.... pt will go ahead and increase his Flecainide.

## 2022-12-24 NOTE — Telephone Encounter (Signed)
-----   Message from Pricilla Riffle, MD sent at 12/23/2022 10:00 PM EDT ----- I am happy to review above with patinet  Ask him when it is a good time to call

## 2022-12-27 ENCOUNTER — Ambulatory Visit: Payer: Medicare Other | Admitting: Physical Therapy

## 2022-12-27 ENCOUNTER — Encounter: Payer: Self-pay | Admitting: Physical Therapy

## 2022-12-27 DIAGNOSIS — R252 Cramp and spasm: Secondary | ICD-10-CM

## 2022-12-27 DIAGNOSIS — M542 Cervicalgia: Secondary | ICD-10-CM

## 2022-12-27 NOTE — Telephone Encounter (Signed)
Spoke with the pt, he will be able to attend the visit with Dr. Tenny Craw this Thursday 6/27, to arrange for outpatient DCCV.    Pt will start looking at some dates to arrange the dccv on, for he will have to make pre-arrangements to get transportation to and from the procedure via his brother and sister-in-law from [place].   He states he does not have any local friends or family around this area, and his brother or sister-in-law will need to come up here from [facility] to get him to and from the procedure.  He will discuss some good dates with them on the phone this week, and will provide those to the nurse working with Dr. Tenny Craw on 6/27 office visit appt.   Pt verbalized understanding and agrees with this plan.

## 2022-12-27 NOTE — Therapy (Signed)
OUTPATIENT PHYSICAL THERAPY TREATMENT   Patient Name: Joseph Tran MRN: [id number] DOB:1950-11-09, 72 y.o., male Today's Date: 12/27/2022  END OF SESSION:  PT End of Session - 12/27/22 0849     Visit Number 3    Number of Visits 12    Date for PT Re-Evaluation 01/26/23    Authorization Type Medicare + [employer]    PT Start Time [phone]    PT Stop Time 0935    PT Time Calculation (min) 48 min    Activity Tolerance Patient tolerated treatment well    Behavior During Therapy WFL for tasks assessed/performed             Past Medical History:  Diagnosis Date   Acoustic neuroma (HCC)    benign - left   Anxiety    Arthritis    Asthma    seasonal, when pollen is high, cold air closes me up   Cancer (HCC)    skin cancer -- arm, scalp, upper back   Chronic kidney disease    sees Dr. Jaye Beagle at [facility], left kidney is non=functioning.  right kidny is at 50%   Clotting disorder (HCC)    Hx DVT - Xarelto   Colon polyps    Complication of anesthesia    Post op nausea/vomiting   Depression    severe.  dx 1985   DVT (deep venous thrombosis) (HCC)    sees Dr. Arlan Organ    History of kidney stones    has kidney stone now.     Hypertension    Hypogonadism male    sees Dr. Hadley Pen at [facility]   Neuropathy of both feet    OSA (obstructive sleep apnea)    Plantar fasciitis    rt foot   Pneumonia    PONV (postoperative nausea and vomiting)    Renal atrophy, left    sees Dr. Hadley Pen at [facility]   Sleep apnea    tested 2010  - wears c-pap   Past Surgical History:  Procedure Laterality Date   CHEST WALL TUMOR EXCISION  2007   COLONOSCOPY  04/07/2020   per Dr. Rhea Belton, adenomatous polyps, repeat in 3 yrs    kidney caluculs  2004-05   KIDNEY STONE SURGERY     x 2c   KNEE ARTHROSCOPY  09/10/2011   right knee, per Dr. Jodi Geralds    KNEE ARTHROSCOPY Right 04/25/2017   Procedure: RIGHT KNEE ARTHROSCOPY, PARTIAL LATERAL MENISECTOMY,  CHONDROPLASTY MEDIAL AND LATERAL PATELLAOFEMORAL;  Surgeon: Jodi Geralds, MD;  Location: [facility] OR;  Service: Orthopedics;  Laterality: Right;   KNEE ARTHROSCOPY Left 09/18/2021   Procedure: ARTHROSCOPY KNEE;  Surgeon: Jodi Geralds, MD;  Location: [facility];  Service: Orthopedics;  Laterality: Left;   KNEE ARTHROSCOPY WITH MEDIAL MENISECTOMY Left 09/18/2021   Procedure: KNEE ARTHROSCOPY WITH MEDIAL MENISECTOMY;  Surgeon: Jodi Geralds, MD;  Location: [facility];  Service: Orthopedics;  Laterality: Left;   madiscus cartilage  2009   MOHS SURGERY     right knee arthroscopy  2008   TONSILLECTOMY     TOTAL KNEE ARTHROPLASTY Right 01/19/2019   Procedure: RIGHT TOTAL KNEE ARTHROPLASTY;  Surgeon: Jodi Geralds, MD;  Location: [facility];  Service: Orthopedics;  Laterality: Right;   TOTAL KNEE ARTHROPLASTY Left 06/14/2022   Procedure: TOTAL KNEE ARTHROPLASTY;  Surgeon: Jodi Geralds, MD;  Location: [facility];  Service: Orthopedics;  Laterality: Left;   UPPER GASTROINTESTINAL ENDOSCOPY     Patient Active Problem  List   Diagnosis Date Noted   Insomnia 11/09/2022   Paroxysmal atrial fibrillation (HCC) 11/09/2022   Vitamin B 12 deficiency 07/14/2022   Preoperative respiratory examination 04/26/2022   Morbid obesity (HCC) 03/26/2022   Primary osteoarthritis of left knee 09/18/2021   Plica of knee, left 09/18/2021   Complex tear of lat mensc, current injury, left knee, init 09/18/2021   Acute meniscal tear, medial, left, initial encounter 09/18/2021   Hyperglycemia 05/30/2019   Primary osteoarthritis of right knee 01/19/2019   CKD (chronic kidney disease), stage III (HCC) 09/13/2017   Renal atrophy, left 09/13/2017   Systemic lupus erythematosus (HCC) 09/13/2017   Dyslipidemia 09/13/2017   Acute meniscal tear, lateral, right, initial encounter 04/25/2017   Osteoarthritis of right knee 04/25/2017   Concussion with loss of consciousness 08/05/2015   Laceration of spleen 08/05/2015   Lumbar stress fracture 08/05/2015    DVT (deep venous thrombosis) (HCC) 10/18/2014   Hx of bacterial pneumonia 03/10/2013   COLONIC POLYPS, HX OF 04/08/2010   NEPHROLITHIASIS, HX OF 04/08/2010   ACOUSTIC NEUROMA 04/07/2010   Hypogonadism male 04/07/2010   Depression with anxiety 04/07/2010   SLEEP APNEA, OBSTRUCTIVE 04/07/2010   Asthma 04/07/2010   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   ERECTILE DYSFUNCTION, 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   PLANTAR FASCIITIS 04/07/2010    PCP: Nelwyn Salisbury, MD   REFERRING PROVIDER: Jodi Geralds, MD  REFERRING DIAG: M54.2 (ICD-10-CM) - Neck pain  THERAPY DIAG:  Cervicalgia  Cramp and spasm  Rationale for Evaluation and Treatment: Rehabilitation  ONSET DATE: Mid April 2024  SUBJECTIVE:                                                                                                                                                                                                         SUBJECTIVE STATEMENT: Still having the dizziness from Afib medications, which were increased on Friday, his heart was in Afib the whole time.  Is going to be scheduled for some sore of procedure, possibly an ablation.  Neck is better, can turn much farther to Left now.    Hand dominance: Right  PERTINENT HISTORY:  R TKA 01/19/19; L knee arthroscopy with medial menisectomy 09/2021; Lupus; L acoustic neuroma - pt reports minor balance issues related to tumor; asthma; skin cancer; morbid obesity; CKD-III; DVT 2016; anxiety; depression.  A-Fib.   PAIN:  Are you having pain? Yes: NPRS scale: 3/10 Pain location: posterior neck  Pain description: achey, headaches Aggravating factors: turn neck too fast or far Relieving factors: Tylenol, heat, ice, hot showers, liniment but  nothing lasts  PRECAUTIONS: Other: Possible Afib  WEIGHT BEARING RESTRICTIONS: No  FALLS:  Has patient fallen in last 6 months? Yes. Number of falls 1, fell backwards tying shoes  LIVING ENVIRONMENT: Lives with: lives with their  family and lives alone Lives in: House/apartment Stairs: Yes: External: 1+1 steps; none and can grab doorframe Has following equipment at home: Single point cane, Walker - 2 wheeled, shower chair, bed side commode, and Grab bars  OCCUPATION: Retired, but works as a Runner, broadcasting/film/video patient" for [employer]   PLOF: Independent and Leisure: working with show horses  PATIENT GOALS: get rid of pain, get back to fulfill obligations and responsibilities   NEXT MD VISIT: end of June with Dr. Luiz Blare  OBJECTIVE:   DIAGNOSTIC FINDINGS:  08/02/2015 CT Spine Cervical .  Alignment: Trace anterolisthesis of C2 on C3 is favored to be degenerative in nature, as well as trace anterolisthesis of C7 on T1.  .  Craniocervical junction: No evidence of fracture or dislocation.  .  Vertebrae: No acute fractures. Vertebral body heights maintained.  [name]  Spinal canal: No significant bony canal stenosis. No gross upper cervical canal hematoma.  .  Degenerative changes: Multilevel degenerative disc and facet disease, with disc disease most advanced spanning C5-C7 with posterior disc osteophyte complex formation and uncovertebral joint spurring resulting in varying degrees of spinal canal and neural foraminal narrowing. Most advanced degenerative facet disease is present on the right at C2-C3.   PATIENT SURVEYS:  NDI 13/50= 26%  COGNITION: Overall cognitive status: Within functional limits for tasks assessed  SENSATION: WFL  POSTURE:  decreased cervical lordosis  PALPATION: Tenderness/tightness throughout suboccipitals, cervical paraspinals and multifidi  CERVICAL ROM:   Active ROM AROM (deg) eval  Flexion 40  Extension 47  Right lateral flexion 15p!  Left lateral flexion 10p!  Right rotation 24p!  Left rotation 32p!   (Blank rows = not tested)  UPPER EXTREMITY ROM:  WNL, no pain   UPPER EXTREMITY MMT:  5/5 all mytomes, no pain. Good grip strength bil.   TODAY'S TREATMENT:                                                                                                                               DATE:    12/27/22 Therapeutic Exercise: to improve strength and mobility.  Demo, verbal and tactile cues throughout for technique. UBE forward x 3 min Vitals HR 84, SpO2 95% Seated chin tucks x 10 Levator stretch x 3 bil  SCM stretch x 3 bil  Manual Therapy: to decrease muscle spasm and pain and improve mobility STM/TPR to cervical paraspinals, UPA mobs to cervical spine in prone, skilled palpation and monitoring during dry needling;  in supine suboccipital release, NAGs into rotation, TPR to L SCM  Trigger Point Dry-Needling  Treatment instructions: Expect mild to moderate muscle soreness. S/S of pneumothorax if dry needled over a lung field, and to seek immediate medical attention should  they occur. Patient verbalized understanding of these instructions and education. Patient Consent Given: Yes Education handout provided: Previously provided Muscles treated: bil C3-C7 multifidi Treatment response/outcome: Twitch Response Elicited and Palpable Increase in Muscle Length  12/22/22 THERAPEUTIC EXERCISE: to improve flexibility, strength and mobility.  Demonstration, verbal and tactile cues throughout for technique. UBE - L1.0 x 6 min (3' each fwd & back)  MANUAL THERAPY: To promote normalized muscle tension, improved flexibility, improved joint mobility, increased ROM, and reduced pain. Skilled palpation and monitoring of soft tissue during DN Trigger Point Dry-Needling  Treatment instructions: Expect mild to moderate muscle soreness. S/S of pneumothorax if dry needled over a lung field, and to seek immediate medical attention should they occur. Patient verbalized understanding of these instructions and education. Patient Consent Given: Yes Education handout provided: Previously provided Muscles treated: B suboccipitals, cervical multifidi and paraspinals  Electrical stimulation performed:  No Parameters: N/A Treatment response/outcome: Twitch Response Elicited and Palpable Increase in Muscle Length STM/DTM, manual TPR and pin & stretch to muscles addressed with DN B suboccipital release x 3 min Gentle cervical distraction 3 x 30" Gentle cervical distraction with PROM through all planes with pause for gentle stretch at end ROM   12/15/22 Self Care: Education on findings, POC, TrDN, initial HEP  Chin tucks Self snags with pillow case    PATIENT EDUCATION:  Education details: role of DN and DN rational, procedure, outcomes, potential side effects, and recommended post-treatment exercises/activity  Person educated: Patient Education method: Explanation Education comprehension: verbalized understanding  HOME EXERCISE PROGRAM: Access Code: [email or web address] URL: [email or web address] Date: 12/27/2022 Prepared by: Harrie Foreman  Exercises - Seated Cervical Retraction  - 3 x daily - 7 x weekly - 1 sets - 10 reps - Cervical Extension AROM with Strap  - 3 x daily - 7 x weekly - 1 sets - 10 reps - Gentle Levator Scapulae Stretch  - 1 x daily - 7 x weekly - 1 sets - 3 reps - 10-30 sec hold - Sternocleidomastoid Stretch  - 1 x daily - 7 x weekly - 1 sets - 3 reps - 10-30 sec hold  ASSESSMENT:  CLINICAL IMPRESSION: Joseph "Ron" reports good response to initial TrDN with minimal soreness, and improved cervical ROM to L, still tight on R today.  Progress HEP with stretches for levator and SCM, and continued manual therapy and TrDN, reported improved rotation both directions following.  Joseph Tran continues to demonstrate potential for improvement and would benefit from continued skilled therapy to address impairments.      OBJECTIVE IMPAIRMENTS: decreased mobility, hypomobility, increased fascial restrictions, increased muscle spasms, postural dysfunction, and pain.   ACTIVITY LIMITATIONS: lifting, bending, and sleeping  PARTICIPATION LIMITATIONS: driving,  shopping, and community activity  PERSONAL FACTORS: Age and 3+ comorbidities: R TKA 01/19/19; L knee arthroscopy with medial menisectomy 09/2021; Lupus; L acoustic neuroma; asthma; skin cancer; morbid obesity; CKD-III; DVT 2016; anxiety; depression; arthritis   are also affecting patient's functional outcome.   REHAB POTENTIAL: Good  CLINICAL DECISION MAKING: Evolving/moderate complexity  EVALUATION COMPLEXITY: Moderate   GOALS: Goals reviewed with patient? Yes  SHORT TERM GOALS: Target date: 12/29/2022   Patient will be independent with initial HEP.  Baseline:  Goal status: IN PROGRESS   LONG TERM GOALS: Target date: 01/26/2023   Patient will be independent with advanced/ongoing HEP to improve outcomes and carryover.  Baseline:   Goal status: IN PROGRESS  2.  Patient will report 75% improvement in neck pain to improve QOL.  Baseline:   Goal status: IN PROGRESS  3.  Patient will demonstrate full pain free cervical ROM for safety with driving.  Baseline: see objective Goal status: IN PROGRESS  4.  Patient will report 7.5 points or more improvement  on QuickDash to demonstrate improved functional ability.  Baseline: 13/50 Goal status: IN PROGRESS  PLAN:  PT FREQUENCY: 1-2x/week  PT DURATION: 6 weeks  PLANNED INTERVENTIONS: Therapeutic exercises, Therapeutic activity, Neuromuscular re-education, Balance training, Gait training, Patient/Family education, Self Care, Joint mobilization, Dry Needling, Electrical stimulation, Spinal manipulation, Spinal mobilization, Cryotherapy, Moist heat, Traction, Ultrasound, Manual therapy, and Re-evaluation  PLAN FOR NEXT SESSION: continue to review and progress exercises, manual therapy including TrDN as indicated and benefit noted   Jena Gauss, PT, DPT 12/27/2022, 9:56 AM

## 2022-12-27 NOTE — Telephone Encounter (Signed)
Loa Socks, LPN 6/96/2952  8:41 PM EDT Back to Top    Tried calling the pt back again and no answer on home number (no voicemail offered) and mobile number still busy.   Went ahead and made him an appt to see Dr. Tenny Craw for this Thursday 6/27 at 3:40 pm.   Loa Socks, LPN 09/25/4008  2:72 PM EDT     Attempted to call the pt back to endorse to him the results and offer him an appt to see Dr. Tenny Craw this week on 6/27 at 3:40 pm, to arrange DCCV, update H&P, EKG, pre-procedure labs/instructions at that time.   Pts phone was busy x 2. No answer on home phone as well.   Pricilla Riffle, MD 12/27/2022  9:48 AM EDT     Monitor shows atrial fibrillation   Average HR 96 Pt to be set up for cardioversion Will need clinic follow up after and echo in a few months

## 2022-12-29 ENCOUNTER — Encounter: Payer: Self-pay | Admitting: Physical Therapy

## 2022-12-29 ENCOUNTER — Ambulatory Visit: Payer: Medicare Other | Admitting: Physical Therapy

## 2022-12-29 DIAGNOSIS — R252 Cramp and spasm: Secondary | ICD-10-CM

## 2022-12-29 DIAGNOSIS — M542 Cervicalgia: Secondary | ICD-10-CM | POA: Diagnosis not present

## 2022-12-29 NOTE — Therapy (Signed)
OUTPATIENT PHYSICAL THERAPY TREATMENT   Patient Name: Joseph Tran MRN: [id number] DOB:06-23-1951, 72 y.o., male Today's Date: 12/29/2022  END OF SESSION:  PT End of Session - 12/29/22 0850     Visit Number 4    Number of Visits 12    Date for PT Re-Evaluation 01/26/23    Authorization Type Medicare + AARP    PT Start Time [phone]    PT Stop Time 0929    PT Time Calculation (min) 40 min    Activity Tolerance Patient tolerated treatment well    Behavior During Therapy WFL for tasks assessed/performed             Past Medical History:  Diagnosis Date   Acoustic neuroma (HCC)    benign - left   Anxiety    Arthritis    Asthma    seasonal, when pollen is high, cold air closes me up   Cancer (HCC)    skin cancer -- arm, scalp, upper back   Chronic kidney disease    sees Dr. Jaye Beagle at [facility], left kidney is non=functioning.  right kidny is at 50%   Clotting disorder (HCC)    Hx DVT - Xarelto   Colon polyps    Complication of anesthesia    Post op nausea/vomiting   Depression    severe.  dx 1985   DVT (deep venous thrombosis) (HCC)    sees Dr. Arlan Organ    History of kidney stones    has kidney stone now.     Hypertension    Hypogonadism male    sees Dr. Hadley Pen at [facility]   Neuropathy of both feet    OSA (obstructive sleep apnea)    Plantar fasciitis    rt foot   Pneumonia    PONV (postoperative nausea and vomiting)    Renal atrophy, left    sees Dr. Hadley Pen at [facility]   Sleep apnea    tested 2010  - wears c-pap   Past Surgical History:  Procedure Laterality Date   CHEST WALL TUMOR EXCISION  2007   COLONOSCOPY  04/07/2020   per Dr. Rhea Belton, adenomatous polyps, repeat in 3 yrs    kidney caluculs  2004-05   KIDNEY STONE SURGERY     x 2c   KNEE ARTHROSCOPY  09/10/2011   right knee, per Dr. Jodi Geralds    KNEE ARTHROSCOPY Right 04/25/2017   Procedure: RIGHT KNEE ARTHROSCOPY, PARTIAL LATERAL MENISECTOMY,  CHONDROPLASTY MEDIAL AND LATERAL PATELLAOFEMORAL;  Surgeon: Jodi Geralds, MD;  Location: [facility] OR;  Service: Orthopedics;  Laterality: Right;   KNEE ARTHROSCOPY Left 09/18/2021   Procedure: ARTHROSCOPY KNEE;  Surgeon: Jodi Geralds, MD;  Location: [facility];  Service: Orthopedics;  Laterality: Left;   KNEE ARTHROSCOPY WITH MEDIAL MENISECTOMY Left 09/18/2021   Procedure: KNEE ARTHROSCOPY WITH MEDIAL MENISECTOMY;  Surgeon: Jodi Geralds, MD;  Location: [facility];  Service: Orthopedics;  Laterality: Left;   madiscus cartilage  2009   MOHS SURGERY     right knee arthroscopy  2008   TONSILLECTOMY     TOTAL KNEE ARTHROPLASTY Right 01/19/2019   Procedure: RIGHT TOTAL KNEE ARTHROPLASTY;  Surgeon: Jodi Geralds, MD;  Location: [facility];  Service: Orthopedics;  Laterality: Right;   TOTAL KNEE ARTHROPLASTY Left 06/14/2022   Procedure: TOTAL KNEE ARTHROPLASTY;  Surgeon: Jodi Geralds, MD;  Location: [facility];  Service: Orthopedics;  Laterality: Left;   UPPER GASTROINTESTINAL ENDOSCOPY     Patient Active Problem  List   Diagnosis Date Noted   Insomnia 11/09/2022   Paroxysmal atrial fibrillation (HCC) 11/09/2022   Vitamin B 12 deficiency 07/14/2022   Preoperative respiratory examination 04/26/2022   Morbid obesity (HCC) 03/26/2022   Primary osteoarthritis of left knee 09/18/2021   Plica of knee, left 09/18/2021   Complex tear of lat mensc, current injury, left knee, init 09/18/2021   Acute meniscal tear, medial, left, initial encounter 09/18/2021   Hyperglycemia 05/30/2019   Primary osteoarthritis of right knee 01/19/2019   CKD (chronic kidney disease), stage III (HCC) 09/13/2017   Renal atrophy, left 09/13/2017   Systemic lupus erythematosus (HCC) 09/13/2017   Dyslipidemia 09/13/2017   Acute meniscal tear, lateral, right, initial encounter 04/25/2017   Osteoarthritis of right knee 04/25/2017   Concussion with loss of consciousness 08/05/2015   Laceration of spleen 08/05/2015   Lumbar stress fracture 08/05/2015    DVT (deep venous thrombosis) (HCC) 10/18/2014   Hx of bacterial pneumonia 03/10/2013   COLONIC POLYPS, HX OF 04/08/2010   NEPHROLITHIASIS, HX OF 04/08/2010   ACOUSTIC NEUROMA 04/07/2010   Hypogonadism male 04/07/2010   Depression with anxiety 04/07/2010   SLEEP APNEA, OBSTRUCTIVE 04/07/2010   Asthma 04/07/2010   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   ERECTILE DYSFUNCTION, 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   PLANTAR FASCIITIS 04/07/2010    PCP: Nelwyn Salisbury, MD   REFERRING PROVIDER: Jodi Geralds, MD  REFERRING DIAG: M54.2 (ICD-10-CM) - Neck pain  THERAPY DIAG:  Cervicalgia  Cramp and spasm  Rationale for Evaluation and Treatment: Rehabilitation  ONSET DATE: Mid April 2024  SUBJECTIVE:                                                                                                                                                                                                         SUBJECTIVE STATEMENT: Felt it tightening up last night, very stiff today.  Hurts if move too far. Seeing MD tomorrow to discuss cardioversion for Afib.    Hand dominance: Right  PERTINENT HISTORY:  R TKA 01/19/19; L knee arthroscopy with medial menisectomy 09/2021; Lupus; L acoustic neuroma - pt reports minor balance issues related to tumor; asthma; skin cancer; morbid obesity; CKD-III; DVT 2016; anxiety; depression.  A-Fib.   PAIN:  Are you having pain? Yes: NPRS scale: 3/10 Pain location: posterior neck  Pain description: achey, headaches Aggravating factors: turn neck too fast or far Relieving factors: Tylenol, heat, ice, hot showers, liniment but nothing lasts  PRECAUTIONS: Other: Possible Afib  WEIGHT BEARING RESTRICTIONS: No  FALLS:  Has patient fallen in last 6 months?  Yes. 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 Luiz Blare  OBJECTIVE:   DIAGNOSTIC FINDINGS:  08/02/2015 CT Spine Cervical .  Alignment: Trace anterolisthesis of C2 on C3 is favored to be degenerative in nature, as well as trace anterolisthesis of C7 on T1.  .  Craniocervical junction: No evidence of fracture or dislocation.  .  Vertebrae: No acute fractures. Vertebral body heights maintained.  [name]  Spinal canal: No significant bony canal stenosis. No gross upper cervical canal hematoma.  .  Degenerative changes: Multilevel degenerative disc and facet disease, with disc disease most advanced spanning C5-C7 with posterior disc osteophyte complex formation and uncovertebral joint spurring resulting in varying degrees of spinal canal and neural foraminal narrowing. Most advanced degenerative facet disease is present on the right at C2-C3.   PATIENT SURVEYS:  NDI 13/50= 26%  COGNITION: Overall cognitive status: Within functional limits for tasks assessed  SENSATION: WFL  POSTURE:  decreased cervical lordosis  PALPATION: Tenderness/tightness throughout suboccipitals, cervical paraspinals and multifidi  CERVICAL ROM:   Active ROM AROM (deg) eval  Flexion 40  Extension 47  Right lateral flexion 15p!  Left lateral flexion 10p!  Right rotation 24p!  Left rotation 32p!   (Blank rows = not tested)  UPPER EXTREMITY ROM:  WNL, no pain   UPPER EXTREMITY MMT:  5/5 all mytomes, no pain. Good grip strength bil.   TODAY'S TREATMENT:                                                                                                                              DATE:    12/29/22 Therapeutic Exercise: to improve strength and mobility.  Demo, verbal and tactile cues throughout for technique. Vitals SpO2 96%, HR 70 Nustep L4 x 5 min  Cervical AROM rotation x 10 bil, flexion/extension x 10 bil, diagonals x 10 bil LS stretch 3 x 10 sec hold bil  Shoulder rolls retro x 10  Manual Therapy: to decrease muscle spasm and pain and improve mobility STM/TPR to cervical paraspinals, R levator scapulae, skilled palpation and monitoring during dry needling;   Trigger Point Dry-Needling  Treatment instructions: Expect mild to moderate muscle soreness. S/S of pneumothorax if dry needled over a lung field, and to seek immediate medical attention should they occur. Patient verbalized understanding of these instructions and education. Patient Consent Given: Yes Education handout provided: Previously provided Muscles treated: bil C3-C5 multifidi, R  levator scapule Treatment response/outcome: Twitch Response Elicited and Palpable Increase in Muscle Length  12/27/22 Therapeutic Exercise: to improve strength and mobility.  Demo, verbal and tactile cues throughout for technique. UBE forward x 3 min Vitals HR 84, SpO2 95% Seated chin tucks x 10 Levator stretch x 3 bil  SCM stretch x 3 bil  Manual Therapy: to decrease muscle spasm and pain and improve mobility STM/TPR to cervical paraspinals, UPA mobs to cervical spine in prone, skilled palpation and monitoring during dry needling;  in supine suboccipital release, NAGs into rotation, TPR to L SCM  Trigger Point Dry-Needling  Treatment instructions: Expect mild to moderate muscle soreness. S/S of pneumothorax if dry needled over a lung field, and to seek immediate medical attention should they occur. Patient verbalized understanding of these instructions and education. Patient Consent Given: Yes Education handout provided: Previously provided Muscles treated: bil C3-C7 multifidi Treatment response/outcome: Twitch Response Elicited and  Palpable Increase in Muscle Length  12/22/22 THERAPEUTIC EXERCISE: to improve flexibility, strength and mobility.  Demonstration, verbal and tactile cues throughout for technique. UBE - L1.0 x 6 min (3' each fwd & back)  MANUAL THERAPY: To promote normalized muscle tension, improved flexibility, improved joint mobility, increased ROM, and reduced pain. Skilled palpation and monitoring of soft tissue during DN Trigger Point Dry-Needling  Treatment instructions: Expect mild to moderate muscle soreness. S/S of pneumothorax if dry needled over a lung field, and to seek immediate medical attention should they occur. Patient verbalized understanding of these instructions and education. Patient Consent Given: Yes Education handout provided: Previously provided Muscles treated: B suboccipitals, cervical multifidi and paraspinals  Electrical stimulation performed: No Parameters: N/A Treatment response/outcome: Twitch Response Elicited and Palpable Increase in Muscle Length STM/DTM, manual TPR and pin & stretch to muscles addressed with DN B suboccipital release x 3 min Gentle cervical distraction 3 x 30" Gentle cervical distraction with PROM through all planes with pause for gentle stretch at end ROM   12/15/22 Self Care: Education on findings, POC, TrDN, initial HEP  Chin tucks Self snags with pillow case    PATIENT EDUCATION:  Education details: role of DN and DN rational, procedure, outcomes, potential side effects, and recommended post-treatment exercises/activity  Person educated: Patient Education method: Explanation Education comprehension: verbalized understanding  HOME EXERCISE PROGRAM: Access Code: [email or web address] URL: [email or web address] Date: 12/27/2022 Prepared by: Harrie Foreman  Exercises - Seated Cervical Retraction  - 3 x daily - 7 x weekly - 1 sets - 10 reps - Cervical Extension AROM with Strap  - 3 x daily - 7 x weekly - 1 sets - 10 reps - Gentle  Levator Scapulae Stretch  - 1 x daily - 7 x weekly - 1 sets - 3 reps - 10-30 sec hold - Sternocleidomastoid Stretch  - 1 x daily - 7 x weekly - 1 sets - 3 reps - 10-30 sec hold  ASSESSMENT:  CLINICAL IMPRESSION: Joseph "Ron" reports mostly stiffness today, pain only with endrange movements.  Focused on R levator which was extremely tight.  Reported decreased tightness following interventions.   Joseph Tran continues to demonstrate potential for improvement and would benefit from continued skilled therapy to address impairments.      OBJECTIVE IMPAIRMENTS: decreased mobility, hypomobility, increased fascial restrictions, increased muscle spasms, postural dysfunction, and pain.   ACTIVITY LIMITATIONS: lifting, bending, and sleeping  PARTICIPATION LIMITATIONS: driving, shopping, and community activity  PERSONAL FACTORS: Age and 3+ comorbidities: R TKA 01/19/19; L knee arthroscopy with medial menisectomy 09/2021; Lupus;  L acoustic neuroma; asthma; skin cancer; morbid obesity; CKD-III; DVT 2016; anxiety; depression; arthritis   are also affecting patient's functional outcome.   REHAB POTENTIAL: Good  CLINICAL DECISION MAKING: Evolving/moderate complexity  EVALUATION COMPLEXITY: Moderate   GOALS: Goals reviewed with patient? Yes  SHORT TERM GOALS: Target date: 12/29/2022   Patient will be independent with initial HEP.  Baseline:  Goal status: MET 12/29/22   LONG TERM GOALS: Target date: 01/26/2023   Patient will be independent with advanced/ongoing HEP to improve outcomes and carryover.  Baseline:   Goal status: IN PROGRESS  2.  Patient will report 75% improvement in neck pain to improve QOL.  Baseline:   Goal status: IN PROGRESS  3.  Patient will demonstrate full pain free cervical ROM for safety with driving.  Baseline: see objective Goal status: IN PROGRESS  4.  Patient will report 7.5 points or more improvement  on QuickDash to demonstrate improved functional ability.   Baseline: 13/50 Goal status: IN PROGRESS  PLAN:  PT FREQUENCY: 1-2x/week  PT DURATION: 6 weeks  PLANNED INTERVENTIONS: Therapeutic exercises, Therapeutic activity, Neuromuscular re-education, Balance training, Gait training, Patient/Family education, Self Care, Joint mobilization, Dry Needling, Electrical stimulation, Spinal manipulation, Spinal mobilization, Cryotherapy, Moist heat, Traction, Ultrasound, Manual therapy, and Re-evaluation  PLAN FOR NEXT SESSION: continue to review and progress exercises, manual therapy including TrDN as indicated and benefit noted   Jena Gauss, PT, DPT 12/29/2022, 10:37 AM

## 2022-12-30 ENCOUNTER — Ambulatory Visit: Payer: Medicare Other | Attending: Internal Medicine | Admitting: Internal Medicine

## 2022-12-30 ENCOUNTER — Encounter: Payer: Self-pay | Admitting: Internal Medicine

## 2022-12-30 VITALS — BP 130/76 | HR 78 | Ht 72.0 in | Wt 261.8 lb

## 2022-12-30 DIAGNOSIS — I4811 Longstanding persistent atrial fibrillation: Secondary | ICD-10-CM | POA: Diagnosis not present

## 2022-12-30 DIAGNOSIS — Z01812 Encounter for preprocedural laboratory examination: Secondary | ICD-10-CM | POA: Diagnosis not present

## 2022-12-30 MED ORDER — METOPROLOL TARTRATE 25 MG PO TABS: 50.0000 mg | ORAL_TABLET | Freq: Two times a day (BID) | ORAL | 3 refills | Status: DC

## 2022-12-30 NOTE — Patient Instructions (Signed)
Medication Instructions:  Stop cardizem. Increase metoprolol tartate to 50 mg twice a day. *If you need a refill on your cardiac medications before your next appointment, please call your pharmacy*   Lab Work: CBC, BMET --- Today If you have labs (blood work) drawn today and your tests are completely normal, you will receive your results only by: MyChart Message (if you have MyChart) OR A paper copy in the mail If you have any lab test that is abnormal or we need to change your treatment, we will call you to review the results.   Testing/Procedures: Your physician has recommended that you have a Cardioversion (DCCV). Electrical Cardioversion uses a jolt of electricity to your heart either through paddles or wired patches attached to your chest. This is a controlled, usually prescheduled, procedure. Defibrillation is done under light anesthesia in the hospital, and you usually go home the day of the procedure. This is done to get your heart back into a normal rhythm. You are not awake for the procedure. Please see the instruction sheet given to you today.    Follow-Up: At [facility], you and your health needs are our priority.  As part of our continuing mission to provide you with exceptional heart care, we have created designated Provider Care Teams.  These Care Teams include your primary Cardiologist (physician) and Advanced Practice Providers (APPs -  Physician Assistants and Nurse Practitioners) who all work together to provide you with the care you need, when you need it.  We recommend signing up for the patient portal called "MyChart".  Sign up information is provided on this After Visit Summary.  MyChart is used to connect with patients for Virtual Visits (Telemedicine).  Patients are able to view lab/test results, encounter notes, upcoming appointments, etc.  Non-urgent messages can be sent to your provider as well.   To learn more about what you can do with MyChart, go to  [email or web address].    Your next appointment:   Will call you for follow up.  Provider:   Dietrich Pates, MD  Other Instructions     Dear Joseph Tran  You are scheduled for a Cardioversion on Wednesday, July 3 with Dr. Cristal Deer.  Please arrive at the [facility] (Main Entrance A) at [facility]: [address] [place], [place] [address] at 11:00 AM (This time is 1 hour(s) before your procedure to ensure your preparation). Free valet parking service is available. You will check in at ADMITTING. The support person will be asked to wait in the waiting room.  It is OK to have someone drop you off and come back when you are ready to be discharged.      DIET:  Nothing to eat or drink after midnight except a sip of water with medications (see medication instructions below)  MEDICATION INSTRUCTIONS: !!IF ANY NEW MEDICATIONS ARE STARTED AFTER TODAY, PLEASE NOTIFY YOUR PROVIDER AS SOON AS POSSIBLE!!  FYI: Medications such as Semaglutide (Ozempic, [place]), Tirzepatide (Mounjaro, Zepbound), Dulaglutide (Trulicity), etc ("GLP1 agonists") AND Canagliflozin (Invokana), Dapagliflozin (Farxiga), Empagliflozin (Jardiance), Ertugliflozin (Steglatro), Bexagliflozin [employer]) or any combination with one of these drugs such as Invokamet (Canagliflozin/Metformin), Synjardy (Empagliflozin/Metformin), etc ("SGLT2 inhibitors") must be held around the time of a procedure. This is not a comprehensive list of all of these drugs. Please review all of your medications and talk to your provider if you take any one of these. If you are not sure, ask your provider.  Continue taking your anticoagulant (blood thinner): Rivaroxaban (  Xarelto).  You will need to continue this after your procedure until you are told by your provider that it is safe to stop.    LABS: Today  FYI:  For your safety, and to allow [place] to monitor your vital signs accurately during the surgery/procedure we request: If you have artificial  nails, gel coating, SNS etc, please have those removed prior to your surgery/procedure. Not having the nail coverings /polish removed may result in cancellation or delay of your surgery/procedure.  You must have a responsible person to drive you home and stay in the waiting area during your procedure. Failure to do so could result in cancellation.  Bring your insurance cards.  *Special Note: Every effort is made to have your procedure done on time. Occasionally there are emergencies that occur at the hospital that may cause delays. Please be patient if a delay does occur.

## 2022-12-30 NOTE — Progress Notes (Signed)
 Cardiology Office Note   Date:  12/30/2022   ID:  Joseph Tran, DOB 11/21/1950, MRN [id number]  PCP:  Fry, Stephen A, MD  Cardiologist:   Ayslin Kundert, MD   Patient presents for follow up of atrial fibrillation    History of Present Illness: Joseph Tran is a 72 y.o. male with a history of HTN, thromboembolic dz,  CKD  (stage III) and  PAF  he is followed by S Fry in April  2024  Started on xarelto    Seen by S Fry again in early May   Clnic note says he was in SR No EKG    I saw the pt for the first time on Dec 03, 2022.   He said he was exhausted, felt heart racing, dizzy at times.  Some days better than others  Reported symptoms were going on for months  Metoprolol was added (25 bid)   The pt had monitor placed that showed he was in Afib 100% time    Flecanide started, initially 50 bid   The pt says with these meds he has felt very tired, light headed at times        Current Meds  Medication Sig   acetaminophen (TYLENOL) 500 MG tablet Take 1,000 mg by mouth every 8 (eight) hours as needed for moderate pain or mild pain.   albuterol (PROAIR HFA) 108 (90 Base) MCG/ACT inhaler INHALE 2 PUFFS EVERY 4  HOURS AS NEEDED FOR  WHEEZING OR SHORTNESS OF  BREATH   Apple Cider Vinegar 500 MG TABS Take 600 mg by mouth daily.   b complex vitamins capsule Take 1 capsule by mouth daily.   bisacodyl (DULCOLAX) 5 MG EC tablet Take 5 mg by mouth at bedtime.   buPROPion (WELLBUTRIN XL) 300 MG 24 hr tablet TAKE 1 TABLET BY MOUTH DAILY   Cholecalciferol 50 MCG (2000 UT) TABS Take 2,000 Units by mouth daily.   clindamycin (CLEOCIN) 300 MG capsule Take 600 mg by mouth once.   clonazePAM (KLONOPIN) 1 MG tablet Take 1 tablet (1 mg total) by mouth 2 (two) times daily as needed for anxiety. for anxiety   desvenlafaxine (PRISTIQ) 50 MG 24 hr tablet TAKE 1 TABLET BY MOUTH DAILY   diltiazem (CARDIZEM CD) 240 MG 24 hr capsule Take 240 mg by mouth daily.   docusate sodium (COLACE) 100 MG capsule Take 1  capsule (100 mg total) by mouth 2 (two) times daily.   flecainide (TAMBOCOR) 50 MG tablet Take 1.5 tablets (75 mg total) by mouth 2 (two) times daily.   furosemide (LASIX) 20 MG tablet TAKE 1 TABLET BY MOUTH  DAILY AS NEEDED   gabapentin (NEURONTIN) 300 MG capsule TAKE 1 CAPSULE(300 MG) BY MOUTH AT BEDTIME   Hypromellose (GENTEAL MILD OP) Apply to eye at bedtime.   metoprolol tartrate (LOPRESSOR) 25 MG tablet Take 1 tablet (25 mg total) by mouth 2 (two) times daily.   prazosin (MINIPRESS) 2 MG capsule Take 1 capsule (2 mg total) by mouth at bedtime.   rivaroxaban (XARELTO) 20 MG TABS tablet Take 1 tablet (20 mg total) by mouth daily with supper.   tamsulosin (FLOMAX) 0.4 MG CAPS capsule Take 1 capsule (0.4 mg total) by mouth daily.     Allergies:   Allopurinol, Penicillins, Sulfamethoxazole, Sulfonamide derivatives, and Dilaudid [hydromorphone hcl]   Past Medical History:  Diagnosis Date   Acoustic neuroma (HCC)    benign - left   Anxiety    Arthritis      Asthma    seasonal, when pollen is high, cold air closes me up   Cancer (HCC)    skin cancer -- arm, scalp, upper back   Chronic kidney disease    sees Dr. Jeanne Zekan at [facility], left kidney is non=functioning.  right kidny is at 50%   Clotting disorder (HCC)    Hx DVT - Xarelto   Colon polyps    Complication of anesthesia    Post op nausea/vomiting   Depression    severe.  dx 1985   DVT (deep venous thrombosis) (HCC)    sees Dr. Peter [facility]    History of kidney stones    has kidney stone now.     Hypertension    Hypogonadism male    sees Dr. Brian Budzyn at [facility]   Neuropathy of both feet    OSA (obstructive sleep apnea)    Plantar fasciitis    rt foot   Pneumonia    PONV (postoperative nausea and vomiting)    Renal atrophy, left    sees Dr. Brian Budzyn at [facility]   Sleep apnea    tested 2010  - wears c-pap    Past Surgical History:  Procedure Laterality Date   CHEST WALL  TUMOR EXCISION  2007   COLONOSCOPY  04/07/2020   per Dr. Pyrtle, adenomatous polyps, repeat in 3 yrs    kidney caluculs  2004-05   KIDNEY STONE SURGERY     x 2c   KNEE ARTHROSCOPY  09/10/2011   right knee, per Dr. John Graves    KNEE ARTHROSCOPY Right 04/25/2017   Procedure: RIGHT KNEE ARTHROSCOPY, PARTIAL LATERAL MENISECTOMY, CHONDROPLASTY MEDIAL AND LATERAL PATELLAOFEMORAL;  Surgeon: Graves, John, MD;  Location: [facility];  Service: Orthopedics;  Laterality: Right;   KNEE ARTHROSCOPY Left 09/18/2021   Procedure: ARTHROSCOPY KNEE;  Surgeon: Graves, John, MD;  Location: [facility];  Service: Orthopedics;  Laterality: Left;   KNEE ARTHROSCOPY WITH MEDIAL MENISECTOMY Left 09/18/2021   Procedure: KNEE ARTHROSCOPY WITH MEDIAL MENISECTOMY;  Surgeon: Graves, John, MD;  Location: [facility];  Service: Orthopedics;  Laterality: Left;   madiscus cartilage  2009   MOHS SURGERY     right knee arthroscopy  2008   TONSILLECTOMY     TOTAL KNEE ARTHROPLASTY Right 01/19/2019   Procedure: RIGHT TOTAL KNEE ARTHROPLASTY;  Surgeon: Graves, John, MD;  [facility];  Service: Orthopedics;  Laterality: Right;   TOTAL KNEE ARTHROPLASTY Left 06/14/2022   Procedure: TOTAL KNEE ARTHROPLASTY;  Surgeon: Graves, John, MD;  [facility];  Service: Orthopedics;  Laterality: Left;   UPPER GASTROINTESTINAL ENDOSCOPY       Social History:  The patient  reports that he has never smoked. He has never used smokeless tobacco. He reports that he does not drink alcohol and does not use drugs.   Family History:  The patient's family history includes Heart disease in his father and another family member; Hyperlipidemia in an other family member; Liver cancer in his paternal grandmother; Stroke in an other family member; Sudden death in an other family member; Throat cancer in his maternal grandmother.    ROS:  Please see the history of present illness. All other systems are reviewed and  Negative to the above problem except as  noted.    PHYSICAL EXAM: VS:  BP 130/76   Pulse 78   Ht 6' (1.829 m)   Wt 261 lb 12.8 oz (118.8 kg)   SpO2 96%     BMI 35.51 kg/m   GEN: Well nourished, well developed, in no acute distress  HEENT: normal  Neck: no JVD, carotid bruits, or masses Cardiac:Irreg irreg   No S3  No murmur   No LE  edema  Respiratory:  clear to auscultation bilaterally, GI: soft, nontender  No hepatomegaly  MS: no deformity Moving all extremities      EKG:  EKG is ordered today.  Atrial fibrillation  124 bpm     Monitor June 2024   Rhythm:   Atrial fibrillation   Rates 44 to 158 bpm   Average HR 96 bpm On short burst of ventricular tachycardia  (5 beats at 156 bpm).    Echo   December 23 2022 1. Left ventricular ejection fraction, by estimation, is 40 to 45%. The  left ventricle has mildly decreased function. The left ventricle has no  regional wall motion abnormalities. Left ventricular diastolic parameters  are indeterminate.   2. Right ventricular systolic function is normal. The right ventricular  size is normal.   3. Left atrial size was moderately dilated.   4. The mitral valve is normal in structure. Moderate mitral valve  regurgitation. No evidence of mitral stenosis.   5. The aortic valve is normal in structure. Aortic valve regurgitation is  not visualized. No aortic stenosis is present.   6. The inferior vena cava is normal in size with greater than 50%  respiratory variability, suggesting right atrial pressure of 3 mmHg.   Lipid Panel    Component Value Date/Time   CHOL 200 07/21/2021 0841   TRIG 129.0 07/21/2021 0841   HDL 47.80 07/21/2021 0841   CHOLHDL 4 07/21/2021 0841   VLDL 25.8 07/21/2021 0841   LDLCALC 126 (H) 07/21/2021 0841   LDLCALC 105 (H) 06/04/2020 0950      Wt Readings from Last 3 Encounters:  12/30/22 261 lb 12.8 oz (118.8 kg)  12/14/22 257 lb (116.6 kg)  12/03/22 256 lb 9.6 oz (116.4 kg)        ASSESSMENT AND PLAN:  1  Atrial fibrillation  Pt  remains in afib on diltiazem/ metoprolol and flecanide   Echo shows LVEF down some.  May be related to uncontrolled rates over time He is feeling very fatigued    Overall volume status is OK Would reocmm stopping diltiazem given low LVEF Increase metoprolol to 50 bid  Keep on Flecanide. Keep on NOAC Set upp for cardioversion   The pt does not have a ride   Has use the [facility] service in past for colonoscopy  Will contact to see if they can bring pt and observe after   2  HFrEF  Recent echo shows mod decrease in LVEF   may be related to afib with uncontrolled rates  Denies CP   EKG without Q waves    Goal to convert to SR then pursue GDMT as BP tolerates  2  Hx of HTN   BP is ok   Follow as adjust meds   3 CKD  Will need to be followed  Repeat BMET   4   Hx of thromboembolic dz leg  Continue Xarelto  Followed in heme clinic ([facility])   Current medicines are reviewed at length with the patient today.  The patient does not have concerns regarding medicines.  Signed, Zelene Barga, MD  12/30/2022 4:12 PM    [facility] [address], [place], [place]  [address] Phone: [phone]; Fax: [phone]   

## 2022-12-30 NOTE — H&P (View-Only) (Signed)
Cardiology Office Note   Date:  12/30/2022   ID:  Joseph Tran, DOB 12-17-1950, MRN [id number]  PCP:  Nelwyn Salisbury, MD  Cardiologist:   Dietrich Pates, MD   Patient presents for follow up of atrial fibrillation    History of Present Illness: Joseph Tran is a 72 y.o. male with a history of HTN, thromboembolic dz,  CKD  (stage III) and  PAF  he is followed by Claris Che in April  2024  Started on xarelto    Seen by Claris Che again in early May   Clnic note says he was in SR No EKG    I saw the pt for the first time on Dec 03, 2022.   He said he was exhausted, felt heart racing, dizzy at times.  Some days better than others  Reported symptoms were going on for months  Metoprolol was added (25 bid)   The pt had monitor placed that showed he was in Afib 100% time    Flecanide started, initially 50 bid   The pt says with these meds he has felt very tired, light headed at times        Current Meds  Medication Sig   acetaminophen (TYLENOL) 500 MG tablet Take 1,000 mg by mouth every 8 (eight) hours as needed for moderate pain or mild pain.   albuterol (PROAIR HFA) 108 (90 Base) MCG/ACT inhaler INHALE 2 PUFFS EVERY 4  HOURS AS NEEDED FOR  WHEEZING OR SHORTNESS OF  BREATH   Apple Cider Vinegar 500 MG TABS Take 600 mg by mouth daily.   b complex vitamins capsule Take 1 capsule by mouth daily.   bisacodyl (DULCOLAX) 5 MG EC tablet Take 5 mg by mouth at bedtime.   buPROPion (WELLBUTRIN XL) 300 MG 24 hr tablet TAKE 1 TABLET BY MOUTH DAILY   Cholecalciferol 50 MCG (2000 UT) TABS Take 2,000 Units by mouth daily.   clindamycin (CLEOCIN) 300 MG capsule Take 600 mg by mouth once.   clonazePAM (KLONOPIN) 1 MG tablet Take 1 tablet (1 mg total) by mouth 2 (two) times daily as needed for anxiety. for anxiety   desvenlafaxine (PRISTIQ) 50 MG 24 hr tablet TAKE 1 TABLET BY MOUTH DAILY   diltiazem (CARDIZEM CD) 240 MG 24 hr capsule Take 240 mg by mouth daily.   docusate sodium (COLACE) 100 MG capsule Take 1  capsule (100 mg total) by mouth 2 (two) times daily.   flecainide (TAMBOCOR) 50 MG tablet Take 1.5 tablets (75 mg total) by mouth 2 (two) times daily.   furosemide (LASIX) 20 MG tablet TAKE 1 TABLET BY MOUTH  DAILY AS NEEDED   gabapentin (NEURONTIN) 300 MG capsule TAKE 1 CAPSULE(300 MG) BY MOUTH AT BEDTIME   Hypromellose (GENTEAL MILD OP) Apply to eye at bedtime.   metoprolol tartrate (LOPRESSOR) 25 MG tablet Take 1 tablet (25 mg total) by mouth 2 (two) times daily.   prazosin (MINIPRESS) 2 MG capsule Take 1 capsule (2 mg total) by mouth at bedtime.   rivaroxaban (XARELTO) 20 MG TABS tablet Take 1 tablet (20 mg total) by mouth daily with supper.   tamsulosin (FLOMAX) 0.4 MG CAPS capsule Take 1 capsule (0.4 mg total) by mouth daily.     Allergies:   Allopurinol, Penicillins, Sulfamethoxazole, Sulfonamide derivatives, and Dilaudid [hydromorphone hcl]   Past Medical History:  Diagnosis Date   Acoustic neuroma (HCC)    benign - left   Anxiety    Arthritis  Asthma    seasonal, when pollen is high, cold air closes me up   Cancer [facility])    skin cancer -- arm, scalp, upper back   Chronic kidney disease    sees Dr. Jaye Beagle at [facility], left kidney is non=functioning.  right kidny is at 50%   Clotting disorder (HCC)    Hx DVT - Xarelto   Colon polyps    Complication of anesthesia    Post op nausea/vomiting   Depression    severe.  dx 1985   DVT (deep venous thrombosis) (HCC)    sees Dr. Arlan Organ    History of kidney stones    has kidney stone now.     Hypertension    Hypogonadism male    sees Dr. Hadley Pen at [facility]   Neuropathy of both feet    OSA (obstructive sleep apnea)    Plantar fasciitis    rt foot   Pneumonia    PONV (postoperative nausea and vomiting)    Renal atrophy, left    sees Dr. Hadley Pen at [facility]   Sleep apnea    tested 2010  - wears c-pap    Past Surgical History:  Procedure Laterality Date   CHEST WALL  TUMOR EXCISION  2007   COLONOSCOPY  04/07/2020   per Dr. Rhea Belton, adenomatous polyps, repeat in 3 yrs    kidney caluculs  2004-05   KIDNEY STONE SURGERY     x 2c   KNEE ARTHROSCOPY  09/10/2011   right knee, per Dr. Jodi Geralds    KNEE ARTHROSCOPY Right 04/25/2017   Procedure: RIGHT KNEE ARTHROSCOPY, PARTIAL LATERAL MENISECTOMY, CHONDROPLASTY MEDIAL AND LATERAL PATELLAOFEMORAL;  Surgeon: Jodi Geralds, MD;  Location: [facility];  Service: Orthopedics;  Laterality: Right;   KNEE ARTHROSCOPY Left 09/18/2021   Procedure: ARTHROSCOPY KNEE;  Surgeon: Jodi Geralds, MD;  Location: [facility];  Service: Orthopedics;  Laterality: Left;   KNEE ARTHROSCOPY WITH MEDIAL MENISECTOMY Left 09/18/2021   Procedure: KNEE ARTHROSCOPY WITH MEDIAL MENISECTOMY;  Surgeon: Jodi Geralds, MD;  Location: [facility];  Service: Orthopedics;  Laterality: Left;   madiscus cartilage  2009   MOHS SURGERY     right knee arthroscopy  2008   TONSILLECTOMY     TOTAL KNEE ARTHROPLASTY Right 01/19/2019   Procedure: RIGHT TOTAL KNEE ARTHROPLASTY;  Surgeon: Jodi Geralds, MD;  Location: [facility];  Service: Orthopedics;  Laterality: Right;   TOTAL KNEE ARTHROPLASTY Left 06/14/2022   Procedure: TOTAL KNEE ARTHROPLASTY;  Surgeon: Jodi Geralds, MD;  Location: [facility];  Service: Orthopedics;  Laterality: Left;   UPPER GASTROINTESTINAL ENDOSCOPY       Social History:  The patient  reports that he has never smoked. He has never used smokeless tobacco. He reports that he does not drink alcohol and does not use drugs.   Family History:  The patient's family history includes Heart disease in his father and another family member; Hyperlipidemia in an other family member; Liver cancer in his paternal grandmother; Stroke in an other family member; Sudden death in an other family member; Throat cancer in his maternal grandmother.    ROS:  Please see the history of present illness. All other systems are reviewed and  Negative to the above problem except as  noted.    PHYSICAL EXAM: VS:  BP 130/76   Pulse 78   Ht 6' (1.829 m)   Wt 261 lb 12.8 oz (118.8 kg)   SpO2 96%  BMI 35.51 kg/m   GEN: Well nourished, well developed, in no acute distress  HEENT: normal  Neck: no JVD, carotid bruits, or masses Cardiac:Irreg irreg   No S3  No murmur   No LE  edema  Respiratory:  clear to auscultation bilaterally, GI: soft, nontender  No hepatomegaly  MS: no deformity Moving all extremities      EKG:  EKG is ordered today.  Atrial fibrillation  124 bpm     Monitor June 2024   Rhythm:   Atrial fibrillation   Rates 44 to 158 bpm   Average HR 96 bpm On short burst of ventricular tachycardia  (5 beats at 156 bpm).    Echo   December 23 2022 1. Left ventricular ejection fraction, by estimation, is 40 to 45%. The  left ventricle has mildly decreased function. The left ventricle has no  regional wall motion abnormalities. Left ventricular diastolic parameters  are indeterminate.   2. Right ventricular systolic function is normal. The right ventricular  size is normal.   3. Left atrial size was moderately dilated.   4. The mitral valve is normal in structure. Moderate mitral valve  regurgitation. No evidence of mitral stenosis.   5. The aortic valve is normal in structure. Aortic valve regurgitation is  not visualized. No aortic stenosis is present.   6. The inferior vena cava is normal in size with greater than 50%  respiratory variability, suggesting right atrial pressure of 3 mmHg.   Lipid Panel    Component Value Date/Time   CHOL 200 07/21/2021 0841   TRIG 129.0 07/21/2021 0841   HDL 47.80 07/21/2021 0841   CHOLHDL 4 07/21/2021 0841   VLDL 25.8 07/21/2021 0841   LDLCALC 126 (H) 07/21/2021 0841   LDLCALC 105 (H) 06/04/2020 0950      Wt Readings from Last 3 Encounters:  12/30/22 261 lb 12.8 oz (118.8 kg)  12/14/22 257 lb (116.6 kg)  12/03/22 256 lb 9.6 oz (116.4 kg)        ASSESSMENT AND PLAN:  1  Atrial fibrillation  Pt  remains in afib on diltiazem/ metoprolol and flecanide   Echo shows LVEF down some.  May be related to uncontrolled rates over time He is feeling very fatigued    Overall volume status is OK Would reocmm stopping diltiazem given low LVEF Increase metoprolol to 50 bid  Keep on Flecanide. Keep on NOAC Set upp for cardioversion   The pt does not have a ride   Has use the [facility] service in past for colonoscopy  Will contact to see if they can bring pt and observe after   2  HFrEF  Recent echo shows mod decrease in LVEF   may be related to afib with uncontrolled rates  Denies CP   EKG without Q waves    Goal to convert to SR then pursue GDMT as BP tolerates  2  Hx of HTN   BP is ok   Follow as adjust meds   3 CKD  Will need to be followed  Repeat BMET   4   Hx of thromboembolic dz leg  Continue Xarelto  Followed in heme clinic ([facility])   Current medicines are reviewed at length with the patient today.  The patient does not have concerns regarding medicines.  Signed, Dietrich Pates, MD  12/30/2022 4:12 PM    [facility] [address] [place], [place], [place]  [address] Phone: [phone]; Fax: [phone]

## 2022-12-31 ENCOUNTER — Telehealth: Payer: Self-pay | Admitting: Licensed Clinical Social Worker

## 2022-12-31 LAB — BASIC METABOLIC PANEL
BUN/Creatinine Ratio: 13 (ref 10–24)
BUN: 25 mg/dL (ref 8–27)
CO2: 19 mmol/L — ABNORMAL LOW (ref 20–29)
Calcium: 9 mg/dL (ref 8.6–10.2)
Chloride: 114 mmol/L — ABNORMAL HIGH (ref 96–106)
Creatinine, Ser: 1.93 mg/dL — ABNORMAL HIGH (ref 0.76–1.27)
Glucose: 84 mg/dL (ref 70–99)
Potassium: 4.5 mmol/L (ref 3.5–5.2)
Sodium: 145 mmol/L — ABNORMAL HIGH (ref 134–144)
eGFR: 37 mL/min/{1.73_m2} — ABNORMAL LOW (ref >59)

## 2022-12-31 LAB — CBC
Hematocrit: 44 % (ref 37.5–51.0)
Hemoglobin: 14.7 g/dL (ref 13.0–17.7)
MCH: 31.5 pg (ref 26.6–33.0)
MCHC: 33.4 g/dL (ref 31.5–35.7)
MCV: 94 fL (ref 79–97)
Platelets: 170 10*3/uL (ref 150–450)
RBC: 4.66 x10E6/uL (ref 4.14–5.80)
RDW: 12.6 % (ref 11.6–15.4)
WBC: 6.4 10*3/uL (ref 3.4–10.8)

## 2022-12-31 NOTE — Telephone Encounter (Signed)
H&V Care Navigation CSW Progress Note  Clinical Social Worker received call back from pt. Introduced self, role, reason for call. Pt confirms that he has access to transportation, utilized [place] [phone]; [phone]). He does not have any family or friends to stay with him and his neighborhood has had lots of turn over.   I shared that I would speak with cath lab about policy following cardioversion. I spoke with Clydie Braun who transferred me to cath lab staff who shared that policy is pt must have someone available for supervision 24hours after procedure. LCSW called pt back and let him know the above. I shared that they dont need to sit by him all day but being present and available is needed just for safety reasons. Inquired again if he has neighbors or another supportive person that can be with him. He denies again having anyone available.   Pt adamant he will be fine as he has had multiple procedures and gone home without supervision. LCSW shared that unfortunately I am not able to provide permission to bypass this procedure requirement. I let him know I will route this to providers to see if they can advise.    Patient is participating in a Managed Medicaid Plan:  No, traditional Medicare and Medicare supplement.   SDOH Screenings   Food Insecurity: No Food Insecurity (05/10/2022)  Housing: Low Risk  (05/10/2022)  Transportation Needs: No Transportation Needs (05/10/2022)  Alcohol Screen: Low Risk  (02/26/2022)  Depression (PHQ2-9): Medium Risk (11/09/2022)  Financial Resource Strain: Low Risk  (02/26/2022)  Physical Activity: Sufficiently Active (05/10/2022)  Recent Concern: Physical Activity - Inactive (02/26/2022)  Social Connections: Unknown (05/10/2022)  Recent Concern: Social Connections - Socially Isolated (02/26/2022)  Stress: Stress Concern Present (05/10/2022)  Tobacco Use: Low Risk  (12/30/2022)   Octavio Graves, MSW, LCSW Clinical Social Worker II [facility]  Navigation  [phone]- work cell phone (preferred) [phone]- desk phone

## 2022-12-31 NOTE — Telephone Encounter (Signed)
H&V Care Navigation CSW Progress Note  Clinical Social Worker contacted patient by phone to f/u on concerns related to upcoming cardioversion. No answer at [phone]. Left voicemail, pt returned my call while I was away from desk. I attempted pt again and again was unable to reach pt. I texted instead of leaving additional voicemail.   Patient is participating in a Managed Medicaid Plan:  No, Medicare Part A and B, supplement.   SDOH Screenings   Food Insecurity: No Food Insecurity (05/10/2022)  Housing: Low Risk  (05/10/2022)  Transportation Needs: No Transportation Needs (05/10/2022)  Alcohol Screen: Low Risk  (02/26/2022)  Depression (PHQ2-9): Medium Risk (11/09/2022)  Financial Resource Strain: Low Risk  (02/26/2022)  Physical Activity: Sufficiently Active (05/10/2022)  Recent Concern: Physical Activity - Inactive (02/26/2022)  Social Connections: Unknown (05/10/2022)  Recent Concern: Social Connections - Socially Isolated (02/26/2022)  Stress: Stress Concern Present (05/10/2022)  Tobacco Use: Low Risk  (12/30/2022)

## 2023-01-03 DIAGNOSIS — L814 Other melanin hyperpigmentation: Secondary | ICD-10-CM | POA: Diagnosis not present

## 2023-01-03 DIAGNOSIS — L821 Other seborrheic keratosis: Secondary | ICD-10-CM | POA: Diagnosis not present

## 2023-01-03 DIAGNOSIS — I872 Venous insufficiency (chronic) (peripheral): Secondary | ICD-10-CM | POA: Diagnosis not present

## 2023-01-03 DIAGNOSIS — D225 Melanocytic nevi of trunk: Secondary | ICD-10-CM | POA: Diagnosis not present

## 2023-01-03 DIAGNOSIS — L570 Actinic keratosis: Secondary | ICD-10-CM | POA: Diagnosis not present

## 2023-01-03 NOTE — Telephone Encounter (Signed)
I called to follow up with the pt re: his Cardioversion on 01/05/23 arrival 11am with Dr Cristal Deer per Dr Tenny Craw.   I spoke with the charge nurse from Endo to see if there will be an issue for the pt to have the [facility] transport take him home when d/c but she says without a "name and plan" for him to give them for after care they will not do the cardioversion/ they will mot sedate him. She says it can even be a neighbor.   I talked with the pt and he says that he does not have anyone including a neighbor... he says all of his neighbors are new and he dose not have a relationship with any of them.   I will follow up with Dr Tenny Craw...  not sure if he gets to the Cardioversion if they will allow him to have it under the circumstances.   Will continue to work on and see if we can plan anything else to help the pt have his procedure.

## 2023-01-03 NOTE — Telephone Encounter (Signed)
H&V Care Navigation CSW Progress Note  Clinical Social Worker  received call back from pt  to f/u on recommendations. At this time Dr. Cristal Deer has deferred to Dr. Tenny Craw for recommendations, I routed this to team covering for Dewayne Hatch, RN with Dr. Tenny Craw. Pt without any supervision at home after cath lab as recommended. LCSW is not able to arrange in home supervision for 24hours. Pt does not think he needs it, unfortunately I am not able to make that decision. Sometimes pts are admitted under observation if no appropriate at home support. Our team remains available. Encouraged him to reach out to triage RN team too to see if anyone else is able to f/u.   Patient is participating in a Managed Medicaid Plan:  No, Medicare and supplement  SDOH Screenings   Food Insecurity: No Food Insecurity (05/10/2022)  Housing: Low Risk  (05/10/2022)  Transportation Needs: No Transportation Needs (05/10/2022)  Alcohol Screen: Low Risk  (02/26/2022)  Depression (PHQ2-9): Medium Risk (11/09/2022)  Financial Resource Strain: Low Risk  (02/26/2022)  Physical Activity: Sufficiently Active (05/10/2022)  Recent Concern: Physical Activity - Inactive (02/26/2022)  Social Connections: Unknown (05/10/2022)  Recent Concern: Social Connections - Socially Isolated (02/26/2022)  Stress: Stress Concern Present (05/10/2022)  Tobacco Use: Low Risk  (12/30/2022)   Octavio Graves, MSW, LCSW Clinical Social Worker II [facility] Navigation  [phone]- work cell phone (preferred) [phone]- desk phone

## 2023-01-04 ENCOUNTER — Encounter: Payer: Self-pay | Admitting: Physical Therapy

## 2023-01-04 ENCOUNTER — Ambulatory Visit: Payer: Medicare Other | Attending: Orthopedic Surgery | Admitting: Physical Therapy

## 2023-01-04 ENCOUNTER — Telehealth: Payer: Self-pay | Admitting: Internal Medicine

## 2023-01-04 DIAGNOSIS — M542 Cervicalgia: Secondary | ICD-10-CM | POA: Diagnosis not present

## 2023-01-04 DIAGNOSIS — R252 Cramp and spasm: Secondary | ICD-10-CM | POA: Diagnosis not present

## 2023-01-04 NOTE — Telephone Encounter (Addendum)
I spoke with Dr Tenny Craw and the pt and he will be admitted fro 24 hour observation since he does not have anyone to oversee him once he gets back home after his Cardioversion.   I spoke with Trish/ cath lab and they are aware of the pts situation.   He will now drive himself to and from the hospital.

## 2023-01-04 NOTE — Pre-Procedure Instructions (Signed)
Patient returned call and stated Dr. Charlott Rakes office told him that he will be admitted tomorrow for observation since he has no one to stay with him after procedure.  Will follow up in morning with charge nurse to confirm.

## 2023-01-04 NOTE — Pre-Procedure Instructions (Signed)
Left message for patient on phone regarding tomorrow's procedure.  Instructed to arrive at 11 am,  NPO after midnight.  Instructed patient to have a ride home and responsible person to stay with him for 24 hours after the procedure.  Instructed not to miss any doses of Xarelto and to take morning of surgery with sip of water.   Take BP meds in the AM with a sip of water

## 2023-01-04 NOTE — Telephone Encounter (Signed)
Pt would like a callback regarding upcoming Cardioversion. Please advise

## 2023-01-04 NOTE — Therapy (Signed)
OUTPATIENT PHYSICAL THERAPY TREATMENT   Patient Name: Joseph Tran MRN: [id number] DOB:04-Jun-1951, 72 y.o., male Today's Date: 01/04/2023  END OF SESSION:  PT End of Session - 01/04/23 0849     Visit Number 5    Number of Visits 12    Date for PT Re-Evaluation 01/26/23    Authorization Type Medicare + AARP    PT Start Time [phone]    PT Stop Time 0932    PT Time Calculation (min) 45 min    Activity Tolerance Patient tolerated treatment well    Behavior During Therapy WFL for tasks assessed/performed             Past Medical History:  Diagnosis Date   Acoustic neuroma (HCC)    benign - left   Anxiety    Arthritis    Asthma    seasonal, when pollen is high, cold air closes me up   Cancer (HCC)    skin cancer -- arm, scalp, upper back   Chronic kidney disease    sees Dr. Jaye Beagle at [facility], left kidney is non=functioning.  right kidny is at 50%   Clotting disorder (HCC)    Hx DVT - Xarelto   Colon polyps    Complication of anesthesia    Post op nausea/vomiting   Depression    severe.  dx 1985   DVT (deep venous thrombosis) (HCC)    sees Dr. Arlan Organ    History of kidney stones    has kidney stone now.     Hypertension    Hypogonadism male    sees Dr. Hadley Pen at [facility]   Neuropathy of both feet    OSA (obstructive sleep apnea)    Plantar fasciitis    rt foot   Pneumonia    PONV (postoperative nausea and vomiting)    Renal atrophy, left    sees Dr. Hadley Pen at [facility]   Sleep apnea    tested 2010  - wears c-pap   Past Surgical History:  Procedure Laterality Date   CHEST WALL TUMOR EXCISION  2007   COLONOSCOPY  04/07/2020   per Dr. Rhea Belton, adenomatous polyps, repeat in 3 yrs    kidney caluculs  2004-05   KIDNEY STONE SURGERY     x 2c   KNEE ARTHROSCOPY  09/10/2011   right knee, per Dr. Jodi Geralds    KNEE ARTHROSCOPY Right 04/25/2017   Procedure: RIGHT KNEE ARTHROSCOPY, PARTIAL LATERAL MENISECTOMY,  CHONDROPLASTY MEDIAL AND LATERAL PATELLAOFEMORAL;  Surgeon: Jodi Geralds, MD;  Location: [facility] OR;  Service: Orthopedics;  Laterality: Right;   KNEE ARTHROSCOPY Left 09/18/2021   Procedure: ARTHROSCOPY KNEE;  Surgeon: Jodi Geralds, MD;  Location: [facility];  Service: Orthopedics;  Laterality: Left;   KNEE ARTHROSCOPY WITH MEDIAL MENISECTOMY Left 09/18/2021   Procedure: KNEE ARTHROSCOPY WITH MEDIAL MENISECTOMY;  Surgeon: Jodi Geralds, MD;  Location: [facility];  Service: Orthopedics;  Laterality: Left;   madiscus cartilage  2009   MOHS SURGERY     right knee arthroscopy  2008   TONSILLECTOMY     TOTAL KNEE ARTHROPLASTY Right 01/19/2019   Procedure: RIGHT TOTAL KNEE ARTHROPLASTY;  Surgeon: Jodi Geralds, MD;  Location: [facility];  Service: Orthopedics;  Laterality: Right;   TOTAL KNEE ARTHROPLASTY Left 06/14/2022   Procedure: TOTAL KNEE ARTHROPLASTY;  Surgeon: Jodi Geralds, MD;  Location: [facility];  Service: Orthopedics;  Laterality: Left;   UPPER GASTROINTESTINAL ENDOSCOPY     Patient Active Problem  List   Diagnosis Date Noted   Insomnia 11/09/2022   Paroxysmal atrial fibrillation (HCC) 11/09/2022   Vitamin B 12 deficiency 07/14/2022   Preoperative respiratory examination 04/26/2022   Morbid obesity (HCC) 03/26/2022   Primary osteoarthritis of left knee 09/18/2021   Plica of knee, left 09/18/2021   Complex tear of lat mensc, current injury, left knee, init 09/18/2021   Acute meniscal tear, medial, left, initial encounter 09/18/2021   Hyperglycemia 05/30/2019   Primary osteoarthritis of right knee 01/19/2019   CKD (chronic kidney disease), stage III (HCC) 09/13/2017   Renal atrophy, left 09/13/2017   Systemic lupus erythematosus (HCC) 09/13/2017   Dyslipidemia 09/13/2017   Acute meniscal tear, lateral, right, initial encounter 04/25/2017   Osteoarthritis of right knee 04/25/2017   Concussion with loss of consciousness 08/05/2015   Laceration of spleen 08/05/2015   Lumbar stress fracture 08/05/2015    DVT (deep venous thrombosis) (HCC) 10/18/2014   Hx of bacterial pneumonia 03/10/2013   COLONIC POLYPS, HX OF 04/08/2010   NEPHROLITHIASIS, HX OF 04/08/2010   ACOUSTIC NEUROMA 04/07/2010   Hypogonadism male 04/07/2010   Depression with anxiety 04/07/2010   SLEEP APNEA, OBSTRUCTIVE 04/07/2010   Asthma 04/07/2010   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   ERECTILE DYSFUNCTION, 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   PLANTAR FASCIITIS 04/07/2010    PCP: Nelwyn Salisbury, MD   REFERRING PROVIDER: Jodi Geralds, MD  REFERRING DIAG: M54.2 (ICD-10-CM) - Neck pain  THERAPY DIAG:  Cervicalgia  Cramp and spasm  Rationale for Evaluation and Treatment: Rehabilitation  ONSET DATE: Mid April 2024  SUBJECTIVE:                                                                                                                                                                                                         SUBJECTIVE STATEMENT: "Stiff but ok" Feels like the treatment last time worked the best, able to turn much more, noticed immediately with driving.  Still pain at endrange.   Has to postpone cardioversion since doesn't have anyone to stay with him after procedure.     Hand dominance: Right  PERTINENT HISTORY:  R TKA 01/19/19; L knee arthroscopy with medial menisectomy 09/2021; Lupus; L acoustic neuroma - pt reports minor balance issues related to tumor; asthma; skin cancer; morbid obesity; CKD-III; DVT 2016; anxiety; depression.  A-Fib.   PAIN:  Are you having pain? Yes: NPRS scale: 0/10 Pain location: posterior neck  Pain description: achey, headaches Aggravating factors: turn neck too fast or far Relieving factors: Tylenol, heat, ice, hot showers, liniment but nothing lasts  PRECAUTIONS: Other: Possible Afib  WEIGHT BEARING RESTRICTIONS: No  FALLS:  Has patient fallen in last 6 months? Yes. 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 Luiz Blare  OBJECTIVE:   DIAGNOSTIC FINDINGS:  08/02/2015 CT Spine Cervical .  Alignment: Trace anterolisthesis of C2 on C3 is favored to be degenerative in nature, as well as trace anterolisthesis of C7 on T1.  .  Craniocervical junction: No evidence of fracture or dislocation.  .  Vertebrae: No acute fractures. Vertebral body heights maintained.  [name]  Spinal canal: No significant bony canal stenosis. No gross upper cervical canal hematoma.  .  Degenerative changes: Multilevel degenerative disc and facet disease, with disc disease most advanced spanning C5-C7 with posterior disc osteophyte complex formation and uncovertebral joint spurring resulting in varying degrees of spinal canal and neural foraminal narrowing. Most advanced degenerative facet disease is present on the right at C2-C3.   PATIENT SURVEYS:  NDI 13/50= 26%  COGNITION: Overall cognitive status: Within functional limits for tasks assessed  SENSATION: WFL  POSTURE:  decreased cervical lordosis  PALPATION: Tenderness/tightness throughout suboccipitals, cervical paraspinals and multifidi  CERVICAL ROM:   Active ROM AROM (deg) eval 01/04/23 AROM  Flexion 40 45  Extension 47 45  Right lateral flexion 15p! 15  Left lateral flexion 10p! 22  Right rotation 24p! 45p!  Left rotation 32p! 60   (Blank rows = not tested)  UPPER EXTREMITY ROM:  WNL, no pain   UPPER EXTREMITY MMT:  5/5 all mytomes, no pain. Good grip strength bil.   TODAY'S TREATMENT:                                                                                                                               DATE:    01/04/23  Therapeutic Exercise: to improve strength and mobility.  Demo, verbal and tactile cues throughout for technique. UBE L1 x 6 min Vitals 96% HR 96, returned to 75 after 1 min rest.  Review of HEP Manual Therapy: to decrease muscle spasm and pain and improve mobility STM/TPR to cervical paraspinals, PA and UPA mobs to cervical spine in supine, NAGs into rotation, skilled palpation and monitoring during dry needling;   Trigger Point Dry-Needling  Treatment instructions: Expect mild to moderate muscle soreness. S/S of pneumothorax if dry needled over a lung field, and to seek immediate medical attention should they occur. Patient verbalized understanding of these  instructions and education. Patient Consent Given: Yes Education handout provided: Previously provided Muscles treated: bil C3-C5 multifidi Treatment response/outcome: Twitch Response Elicited and Palpable Increase in Muscle Length  12/29/22 Therapeutic Exercise: to improve strength and mobility.  Demo, verbal and tactile cues throughout for technique. Vitals SpO2 96%, HR 70 Nustep L4 x 5 min  Cervical AROM rotation x 10 bil, flexion/extension x 10 bil, diagonals x 10 bil LS stretch 3 x 10 sec hold bil  Shoulder rolls retro x 10  Manual Therapy: to decrease muscle spasm and pain and improve mobility STM/TPR to cervical paraspinals, R levator scapulae, skilled palpation and monitoring during dry needling;   Trigger Point Dry-Needling  Treatment instructions: Expect mild to moderate muscle soreness. S/S of pneumothorax if dry needled over a lung field, and to seek immediate medical attention should they occur. Patient verbalized understanding of these instructions and education. Patient Consent Given: Yes Education handout provided: Previously provided Muscles treated: bil C3-C5 multifidi, R  levator scapule Treatment response/outcome: Twitch Response Elicited and Palpable Increase in Muscle Length  12/27/22 Therapeutic Exercise: to improve strength and mobility.  Demo, verbal and tactile cues throughout for technique. UBE forward x 3 min Vitals HR 84, SpO2 95% Seated chin tucks x 10 Levator stretch x 3 bil  SCM stretch x 3 bil  Manual Therapy: to decrease muscle spasm and pain and improve mobility STM/TPR to cervical paraspinals, UPA mobs to cervical spine in prone, skilled palpation and monitoring during dry needling;  in supine suboccipital release, NAGs into rotation, TPR to L SCM  Trigger Point Dry-Needling  Treatment instructions: Expect mild to moderate muscle soreness. S/S of pneumothorax if dry needled over a lung field, and to seek immediate medical attention should they occur. Patient verbalized understanding of these instructions and education. Patient Consent Given: Yes Education handout provided: Previously provided Muscles treated: bil C3-C7 multifidi Treatment response/outcome: Twitch Response Elicited and Palpable Increase in Muscle Length  12/22/22 THERAPEUTIC EXERCISE: to improve flexibility, strength and mobility.  Demonstration, verbal and tactile cues throughout for technique. UBE - L1.0 x 6 min (3' each fwd & back)  MANUAL THERAPY: To promote normalized muscle tension, improved flexibility, improved joint mobility, increased ROM, and reduced pain. Skilled palpation and monitoring of soft tissue during DN Trigger Point Dry-Needling  Treatment instructions: Expect mild to moderate muscle soreness. S/S of pneumothorax if dry needled over a lung field, and to seek immediate medical attention should they occur. Patient verbalized understanding of these instructions and education. Patient Consent Given: Yes Education handout provided: Previously provided Muscles treated: B suboccipitals, cervical multifidi and paraspinals  Electrical stimulation performed:  No Parameters: N/A Treatment response/outcome: Twitch Response Elicited and Palpable Increase in Muscle Length STM/DTM, manual TPR and pin & stretch to muscles addressed with DN B suboccipital release x 3 min Gentle cervical distraction 3 x 30" Gentle cervical distraction with PROM through all planes with pause for gentle stretch at end ROM   12/15/22 Self Care: Education on findings, POC, TrDN, initial HEP  Chin tucks Self snags with pillow case    PATIENT EDUCATION:  Education details: role of DN and DN rational, procedure, outcomes, potential side effects, and recommended post-treatment exercises/activity  Person educated: Patient Education method: Explanation Education comprehension: verbalized understanding  HOME EXERCISE PROGRAM: Access Code: [email or web address] URL: [email or web address] Date: 12/27/2022 Prepared by: Harrie Foreman  Exercises - Seated Cervical Retraction  - 3 x daily - 7 x weekly - 1 sets - 10 reps - Cervical Extension AROM with Strap  -  3 x daily - 7 x weekly - 1 sets - 10 reps - Gentle Levator Scapulae Stretch  - 1 x daily - 7 x weekly - 1 sets - 3 reps - 10-30 sec hold - Sternocleidomastoid Stretch  - 1 x daily - 7 x weekly - 1 sets - 3 reps - 10-30 sec hold  ASSESSMENT:  CLINICAL IMPRESSION: Joseph "Ron" continues to report improvement with measurable improvement in rotation today compared to IE, but still much tighter turning to Right compared to left.  Continued to focus on manual therapy to improve ROM.   Joseph Tran continues to demonstrate potential for improvement and would benefit from continued skilled therapy to address impairments.      OBJECTIVE IMPAIRMENTS: decreased mobility, hypomobility, increased fascial restrictions, increased muscle spasms, postural dysfunction, and pain.   ACTIVITY LIMITATIONS: lifting, bending, and sleeping  PARTICIPATION LIMITATIONS: driving, shopping, and community activity  PERSONAL FACTORS: Age  and 3+ comorbidities: R TKA 01/19/19; L knee arthroscopy with medial menisectomy 09/2021; Lupus; L acoustic neuroma; asthma; skin cancer; morbid obesity; CKD-III; DVT 2016; anxiety; depression; arthritis   are also affecting patient's functional outcome.   REHAB POTENTIAL: Good  CLINICAL DECISION MAKING: Evolving/moderate complexity  EVALUATION COMPLEXITY: Moderate   GOALS: Goals reviewed with patient? Yes  SHORT TERM GOALS: Target date: 12/29/2022   Patient will be independent with initial HEP.  Baseline:  Goal status: MET 12/29/22   LONG TERM GOALS: Target date: 01/26/2023   Patient will be independent with advanced/ongoing HEP to improve outcomes and carryover.  Baseline:   Goal status: IN PROGRESS  2.  Patient will report 75% improvement in neck pain to improve QOL.  Baseline:   Goal status: IN PROGRESS  3.  Patient will demonstrate full pain free cervical ROM for safety with driving.  Baseline: see objective Goal status: IN PROGRESS  4.  Patient will report 7.5 points or more improvement  on QuickDash to demonstrate improved functional ability.  Baseline: 13/50 Goal status: IN PROGRESS  PLAN:  PT FREQUENCY: 1-2x/week  PT DURATION: 6 weeks  PLANNED INTERVENTIONS: Therapeutic exercises, Therapeutic activity, Neuromuscular re-education, Balance training, Gait training, Patient/Family education, Self Care, Joint mobilization, Dry Needling, Electrical stimulation, Spinal manipulation, Spinal mobilization, Cryotherapy, Moist heat, Traction, Ultrasound, Manual therapy, and Re-evaluation  PLAN FOR NEXT SESSION: continue to review and progress exercises, manual therapy including TrDN as indicated and benefit noted   Jena Gauss, PT, DPT 01/04/2023, 10:51 AM

## 2023-01-04 NOTE — Telephone Encounter (Signed)
Spoke with the pt.. see previous encounter.

## 2023-01-05 ENCOUNTER — Other Ambulatory Visit: Payer: Self-pay

## 2023-01-05 ENCOUNTER — Ambulatory Visit (HOSPITAL_BASED_OUTPATIENT_CLINIC_OR_DEPARTMENT_OTHER): Payer: Medicare Other | Admitting: Anesthesiology

## 2023-01-05 ENCOUNTER — Encounter (HOSPITAL_COMMUNITY): Payer: Self-pay | Admitting: Cardiology

## 2023-01-05 ENCOUNTER — Ambulatory Visit: Payer: Medicare Other | Admitting: Internal Medicine

## 2023-01-05 ENCOUNTER — Ambulatory Visit (HOSPITAL_COMMUNITY): Payer: Medicare Other | Admitting: Anesthesiology

## 2023-01-05 ENCOUNTER — Encounter (HOSPITAL_COMMUNITY)
Admission: RE | Disposition: A | Discharge status: Home / Self Care | Payer: Self-pay | Source: Home / Self Care | Attending: Cardiology

## 2023-01-05 ENCOUNTER — Observation Stay (HOSPITAL_COMMUNITY)
Admission: RE | Admit: 2023-01-05 | Discharge: 2023-01-06 | Disposition: A | Discharge status: Home / Self Care | Payer: Medicare Other | Attending: Cardiology | Admitting: Cardiology

## 2023-01-05 DIAGNOSIS — Z96653 Presence of artificial knee joint, bilateral: Secondary | ICD-10-CM | POA: Diagnosis not present

## 2023-01-05 DIAGNOSIS — Z85828 Personal history of other malignant neoplasm of skin: Secondary | ICD-10-CM | POA: Insufficient documentation

## 2023-01-05 DIAGNOSIS — I4891 Unspecified atrial fibrillation: Secondary | ICD-10-CM | POA: Diagnosis not present

## 2023-01-05 DIAGNOSIS — I130 Hypertensive heart and chronic kidney disease with heart failure and stage 1 through stage 4 chronic kidney disease, or unspecified chronic kidney disease: Secondary | ICD-10-CM

## 2023-01-05 DIAGNOSIS — N183 Chronic kidney disease, stage 3 unspecified: Secondary | ICD-10-CM | POA: Diagnosis not present

## 2023-01-05 DIAGNOSIS — I509 Heart failure, unspecified: Secondary | ICD-10-CM

## 2023-01-05 DIAGNOSIS — Z86718 Personal history of other venous thrombosis and embolism: Secondary | ICD-10-CM | POA: Insufficient documentation

## 2023-01-05 DIAGNOSIS — I480 Paroxysmal atrial fibrillation: Secondary | ICD-10-CM | POA: Diagnosis not present

## 2023-01-05 DIAGNOSIS — J45909 Unspecified asthma, uncomplicated: Secondary | ICD-10-CM | POA: Diagnosis not present

## 2023-01-05 DIAGNOSIS — I4819 Other persistent atrial fibrillation: Secondary | ICD-10-CM | POA: Diagnosis not present

## 2023-01-05 DIAGNOSIS — I5022 Chronic systolic (congestive) heart failure: Secondary | ICD-10-CM | POA: Diagnosis not present

## 2023-01-05 DIAGNOSIS — Z7901 Long term (current) use of anticoagulants: Secondary | ICD-10-CM | POA: Diagnosis not present

## 2023-01-05 DIAGNOSIS — E785 Hyperlipidemia, unspecified: Secondary | ICD-10-CM | POA: Diagnosis not present

## 2023-01-05 DIAGNOSIS — I10 Essential (primary) hypertension: Secondary | ICD-10-CM | POA: Insufficient documentation

## 2023-01-05 DIAGNOSIS — Z79899 Other long term (current) drug therapy: Secondary | ICD-10-CM | POA: Diagnosis not present

## 2023-01-05 DIAGNOSIS — G4733 Obstructive sleep apnea (adult) (pediatric): Secondary | ICD-10-CM | POA: Diagnosis present

## 2023-01-05 HISTORY — PX: CARDIOVERSION: SHX1299

## 2023-01-05 SURGERY — CARDIOVERSION
Anesthesia: General

## 2023-01-05 MED ORDER — VENLAFAXINE HCL ER 75 MG PO CP24
75.0000 mg | ORAL_CAPSULE | Freq: Every day | ORAL | Status: DC
  Administered 2023-01-06: 75 mg via ORAL
  Filled 2023-01-05: qty 1

## 2023-01-05 MED ORDER — PROPOFOL 10 MG/ML IV BOLUS
INTRAVENOUS | Status: DC | PRN
  Administered 2023-01-05: 70 mg via INTRAVENOUS

## 2023-01-05 MED ORDER — ACETAMINOPHEN 325 MG PO TABS: 650.0000 mg | ORAL_TABLET | ORAL | Status: DC | PRN

## 2023-01-05 MED ORDER — ORAL CARE MOUTH RINSE: 15.0000 mL | OROMUCOSAL | Status: DC | PRN

## 2023-01-05 MED ORDER — BUPROPION HCL ER (XL) 300 MG PO TB24
300.0000 mg | ORAL_TABLET | Freq: Every day | ORAL | Status: DC
  Administered 2023-01-06: 300 mg via ORAL
  Filled 2023-01-05: qty 1

## 2023-01-05 MED ORDER — PRAZOSIN HCL 2 MG PO CAPS
2.0000 mg | ORAL_CAPSULE | Freq: Every day | ORAL | Status: DC
  Administered 2023-01-05: 2 mg via ORAL
  Filled 2023-01-05 (×2): qty 1

## 2023-01-05 MED ORDER — BISACODYL 5 MG PO TBEC
5.0000 mg | DELAYED_RELEASE_TABLET | Freq: Every day | ORAL | Status: DC
  Administered 2023-01-05: 5 mg via ORAL
  Filled 2023-01-05: qty 1

## 2023-01-05 MED ORDER — METOPROLOL TARTRATE 50 MG PO TABS
50.0000 mg | ORAL_TABLET | Freq: Two times a day (BID) | ORAL | Status: DC
  Administered 2023-01-05 – 2023-01-06 (×2): 50 mg via ORAL
  Filled 2023-01-05 (×2): qty 1

## 2023-01-05 MED ORDER — RIVAROXABAN 20 MG PO TABS
20.0000 mg | ORAL_TABLET | Freq: Every day | ORAL | Status: DC
  Administered 2023-01-05: 20 mg via ORAL
  Filled 2023-01-05: qty 1

## 2023-01-05 MED ORDER — FLECAINIDE ACETATE 50 MG PO TABS
75.0000 mg | ORAL_TABLET | Freq: Two times a day (BID) | ORAL | Status: DC
  Administered 2023-01-05 – 2023-01-06 (×2): 75 mg via ORAL
  Filled 2023-01-05 (×2): qty 2

## 2023-01-05 MED ORDER — ONDANSETRON HCL 4 MG/2ML IJ SOLN: 4.0000 mg | Freq: Four times a day (QID) | INTRAMUSCULAR | Status: DC | PRN

## 2023-01-05 MED ORDER — ACETAMINOPHEN 500 MG PO TABS: 1000.0000 mg | ORAL_TABLET | Freq: Three times a day (TID) | ORAL | Status: DC | PRN

## 2023-01-05 MED ORDER — TAMSULOSIN HCL 0.4 MG PO CAPS
0.4000 mg | ORAL_CAPSULE | Freq: Every day | ORAL | Status: DC
  Administered 2023-01-06: 0.4 mg via ORAL
  Filled 2023-01-05: qty 1

## 2023-01-05 MED ORDER — RIVAROXABAN 20 MG PO TABS: 20.0000 mg | ORAL_TABLET | Freq: Every day | ORAL | Status: DC

## 2023-01-05 MED ORDER — SODIUM CHLORIDE 0.9 % IV SOLN: INTRAVENOUS | Status: DC

## 2023-01-05 MED ORDER — LIDOCAINE 2% (20 MG/ML) 5 ML SYRINGE
INTRAMUSCULAR | Status: DC | PRN
  Administered 2023-01-05: 40 mg via INTRAVENOUS

## 2023-01-05 MED ORDER — GABAPENTIN 300 MG PO CAPS
300.0000 mg | ORAL_CAPSULE | Freq: Every day | ORAL | Status: DC
  Administered 2023-01-05: 300 mg via ORAL
  Filled 2023-01-05: qty 1

## 2023-01-05 SURGICAL SUPPLY — 1 items: ELECT DEFIB PAD ADLT CADENCE (PAD) ×1 IMPLANT

## 2023-01-05 NOTE — Plan of Care (Signed)

## 2023-01-05 NOTE — CV Procedure (Signed)
Procedure:   DCCV  Indication:  Symptomatic atrial fibrillation  Procedure Note:  The patient signed informed consent.  They have had had therapeutic anticoagulation with rivaroxaban greater than 3 weeks.  Anesthesia was administered by Dr. Mal Amabile.  Patient received 40 mg IV lidocaine and 70 mg IV propofol.Adequate airway was maintained throughout and vital followed per protocol.  They were cardioverted x 1 with 150J of biphasic synchronized energy.  They converted to NSR.  There were no apparent complications.  The patient had normal neuro status and respiratory status post procedure with vitals stable as recorded elsewhere.    Follow up:  They will continue on current medical therapy and follow up with cardiology as scheduled.  Jodelle Red, MD PhD 01/05/2023 12:18 PM

## 2023-01-05 NOTE — Transfer of Care (Signed)
Immediate Anesthesia Transfer of Care Note  Patient: Joseph Tran  Procedure(s) Performed: CARDIOVERSION  Patient Location: Cath Lab  Anesthesia Type:General  Level of Consciousness: awake, alert , oriented, and drowsy  Airway & Oxygen Therapy: Patient Spontanous Breathing and Patient connected to nasal cannula oxygen  Post-op Assessment: Report given to RN and Post -op Vital signs reviewed and stable  Post vital signs: Reviewed and stable  Last Vitals:  Vitals Value Taken Time  BP 133/100   Temp    Pulse 68   Resp 17   SpO2 95     Last Pain:  Vitals:   01/05/23 1111  TempSrc: Temporal  PainSc: 0-No pain         Complications: No notable events documented.

## 2023-01-05 NOTE — Anesthesia Preprocedure Evaluation (Addendum)
Anesthesia Evaluation  Patient identified by MRN, date of birth, ID band Patient awake    Reviewed: Allergy & Precautions, NPO status , Patient's Chart, lab work & pertinent test results, reviewed documented beta blocker date and time   History of Anesthesia Complications (+) PONV and history of anesthetic complications  Airway Mallampati: II  TM Distance: >3 FB Neck ROM: Full    Dental  (+) Dental Advisory Given, Teeth Intact   Pulmonary asthma , sleep apnea and Continuous Positive Airway Pressure Ventilation    Pulmonary exam normal        Cardiovascular hypertension, Pt. on medications and Pt. on home beta blockers +CHF and + DVT  + dysrhythmias Atrial Fibrillation + Valvular Problems/Murmurs MR  Rhythm:Irregular Rate:Normal   '24 TTE - EF 40 to 45%. Left atrial size was moderately dilated. Moderate mitral valve  regurgitation.    Neuro/Psych  PSYCHIATRIC DISORDERS Anxiety Depression     Acoustic neuroma   Neuromuscular disease    GI/Hepatic negative GI ROS, Neg liver ROS,,,  Endo/Other    Morbid obesity  Renal/GU CRFRenal disease     Musculoskeletal  (+) Arthritis ,    Abdominal   Peds  Hematology  On xarelto    Anesthesia Other Findings   Reproductive/Obstetrics                             Anesthesia Physical Anesthesia Plan  ASA: 3  Anesthesia Plan: General   Post-op Pain Management: Minimal or no pain anticipated   Induction: Intravenous  PONV Risk Score and Plan: 3 and Treatment may vary due to age or medical condition and Propofol infusion  Airway Management Planned: Natural Airway and Mask  Additional Equipment: None  Intra-op Plan:   Post-operative Plan:   Informed Consent: I have reviewed the patients History and Physical, chart, labs and discussed the procedure including the risks, benefits and alternatives for the proposed anesthesia with the patient or  authorized representative who has indicated his/her understanding and acceptance.       Plan Discussed with: CRNA and Anesthesiologist  Anesthesia Plan Comments:         Anesthesia Quick Evaluation

## 2023-01-05 NOTE — Anesthesia Postprocedure Evaluation (Signed)
Anesthesia Post Note  Patient: Joseph Tran  Procedure(s) Performed: CARDIOVERSION     Patient location during evaluation: PACU Anesthesia Type: General Level of consciousness: awake and alert Pain management: pain level controlled Vital Signs Assessment: post-procedure vital signs reviewed and stable Respiratory status: spontaneous breathing, nonlabored ventilation and respiratory function stable Cardiovascular status: stable and blood pressure returned to baseline Anesthetic complications: no   No notable events documented.  Last Vitals:  Vitals:   01/05/23 1250 01/05/23 1321  BP: (!) 137/95 (!) 143/106  Pulse: 67 66  Resp:  18  Temp:  36.6 C  SpO2: 94%     Last Pain:  Vitals:   01/05/23 1321  TempSrc: Oral  PainSc:                  Beryle Lathe

## 2023-01-05 NOTE — Interval H&P Note (Signed)
History and Physical Interval Note:  01/05/2023 11:07 AM  Joseph Tran  has presented today for surgery, with the diagnosis of AFIB.  The various methods of treatment have been discussed with the patient and family. After consideration of risks, benefits and other options for treatment, the patient has consented to  Procedure(s): CARDIOVERSION (N/A) as a surgical intervention.  The patient's history has been reviewed, patient examined, no change in status, stable for surgery.  I have reviewed the patient's chart and labs.  Questions were answered to the patient's satisfaction.     Cairo Lingenfelter Cristal Deer

## 2023-01-05 NOTE — Anesthesia Procedure Notes (Signed)
Procedure Name: MAC Date/Time: 01/05/2023 12:13 PM  Performed by: Gus Puma, CRNAPre-anesthesia Checklist: Patient identified, Emergency Drugs available, Suction available, Patient being monitored and Timeout performed Patient Re-evaluated:Patient Re-evaluated prior to induction Oxygen Delivery Method: Nasal cannula Preoxygenation: Pre-oxygenation with 100% oxygen Induction Type: IV induction Placement Confirmation: positive ETCO2

## 2023-01-06 DIAGNOSIS — I10 Essential (primary) hypertension: Secondary | ICD-10-CM | POA: Diagnosis not present

## 2023-01-06 DIAGNOSIS — I5022 Chronic systolic (congestive) heart failure: Secondary | ICD-10-CM | POA: Diagnosis not present

## 2023-01-06 DIAGNOSIS — I4819 Other persistent atrial fibrillation: Secondary | ICD-10-CM | POA: Diagnosis not present

## 2023-01-06 DIAGNOSIS — N183 Chronic kidney disease, stage 3 unspecified: Secondary | ICD-10-CM | POA: Diagnosis not present

## 2023-01-06 DIAGNOSIS — I130 Hypertensive heart and chronic kidney disease with heart failure and stage 1 through stage 4 chronic kidney disease, or unspecified chronic kidney disease: Secondary | ICD-10-CM | POA: Diagnosis not present

## 2023-01-06 DIAGNOSIS — Z7901 Long term (current) use of anticoagulants: Secondary | ICD-10-CM | POA: Diagnosis not present

## 2023-01-06 DIAGNOSIS — J45909 Unspecified asthma, uncomplicated: Secondary | ICD-10-CM | POA: Diagnosis not present

## 2023-01-06 LAB — BASIC METABOLIC PANEL
Anion gap: 8 (ref 5–15)
BUN: 24 mg/dL — ABNORMAL HIGH (ref 8–23)
CO2: 19 mmol/L — ABNORMAL LOW (ref 22–32)
Calcium: 8.4 mg/dL — ABNORMAL LOW (ref 8.9–10.3)
Chloride: 113 mmol/L — ABNORMAL HIGH (ref 98–111)
Creatinine, Ser: 1.84 mg/dL — ABNORMAL HIGH (ref 0.61–1.24)
GFR, Estimated: 39 mL/min — ABNORMAL LOW (ref >60)
Glucose, Bld: 93 mg/dL (ref 70–99)
Potassium: 3.8 mmol/L (ref 3.5–5.1)
Sodium: 140 mmol/L (ref 135–145)

## 2023-01-06 NOTE — Plan of Care (Signed)
  Problem: Education: Goal: Knowledge of General Education information will improve Description Including pain rating scale, medication(s)/side effects and non-pharmacologic comfort measures Outcome: Progressing   

## 2023-01-06 NOTE — Progress Notes (Signed)
Discharge instructions (including medications) discussed with and copy provided to patient/caregiver. PIV x 1 removed and patient dressed himself. CCMD made aware of discharge and monitor removed.

## 2023-01-06 NOTE — Discharge Summary (Addendum)
Discharge Summary    Patient ID: Joseph Tran MRN: [id number]; DOB: May 30, 1951  Admit date: 01/05/2023 Discharge date: 01/06/2023  PCP:  Nelwyn Salisbury, MD   [facility] Providers Cardiologist:  Dietrich Pates, MD        Discharge Diagnoses    Principal Problem:   Atrial fibrillation [facility]) Active Problems:   SLEEP APNEA, OBSTRUCTIVE   Essential hypertension   Diagnostic Studies/Procedures    DCCV: 01/05/2023 Indication:  Symptomatic atrial fibrillation   Procedure Note:  The patient signed informed consent.  They have had had therapeutic anticoagulation with rivaroxaban greater than 3 weeks.  Anesthesia was administered by Dr. Mal Amabile.  Patient received 40 mg IV lidocaine and 70 mg IV propofol.Adequate airway was maintained throughout and vital followed per protocol.  They were cardioverted x 1 with 150J of biphasic synchronized energy.  They converted to NSR.  There were no apparent complications.  The patient had normal neuro status and respiratory status post procedure with vitals stable as recorded elsewhere.     Follow up:  They will continue on current medical therapy and follow up with cardiology as scheduled.   History of Present Illness     Joseph Tran is a 72 y.o. 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   He has been followed by Dr. Tenny Craw in the outpatient setting and has been in atrial fibrillation with 100% burden by recent monitor. Recent echo had shown his EF was also mildly reduced at 40-45%. At the time of his office visit on 12/30/2022, his Lopressor was increased to 50mg  BID and he was continued on Flecainide 75mg  BID along with being continued on Xarelto. He did report not having anyone to drive him home following the procedure, therefore it was arranged  for him to be observed overnight following his procedure.   Hospital Course     Consultants: None   He did undergo DCCV by Dr. Cristal Deer on 01/05/2023 and returned to NSR with 150 J biphasic shock. Was recommended to continue his current medical therapy. He was observed overnight and maintained NSR. No reported chest pain or palpitations. He was examined by Dr. Bjorn Pippin and deemed stable for discharge. He does have previously scheduled follow-up for 02/02/2023.  _____________  Discharge Physical Exam and Vitals Blood pressure (!) 140/99, pulse 61, temperature 97.7 F (36.5 C), temperature source Oral, resp. rate 20, height 6' (1.829 m), weight 118.8 kg, SpO2 97 %.  Filed Weights   01/05/23 1321  Weight: 118.8 kg   General: Pleasant male appearing in NAD Psych: Normal affect. Neuro: Alert and oriented X 3. Moves all extremities spontaneously. HEENT: Normal  Neck: Supple without bruits or JVD. Lungs:  Resp regular and unlabored, CTA without wheezing or rales. Heart: RRR no s3, s4, or murmurs. Abdomen: Soft, non-tender, non-distended, BS + x 4.  Extremities: No clubbing, cyanosis or pitting edema. DP/PT/Radials 2+ and equal bilaterally.  Labs & Radiologic Studies    CBC No results for input(s): "WBC", "NEUTROABS", "HGB", "HCT", "MCV", "PLT" in the last 72 hours. Basic Metabolic Panel Recent Labs    91/47/82 0101  NA 140  K 3.8  CL 113*  CO2 19*  GLUCOSE 93  BUN 24*  CREATININE 1.84*  CALCIUM 8.4*   Liver Function Tests No results for input(s): "AST", "  ALT", "ALKPHOS", "BILITOT", "PROT", "ALBUMIN" in the last 72 hours. No results for input(s): "LIPASE", "AMYLASE" in the last 72 hours. High Sensitivity Troponin:   No results for input(s): "TROPONINIHS" in the last 720 hours.  BNP Invalid input(s): "POCBNP" D-Dimer No results for input(s): "DDIMER" in the last 72 hours. Hemoglobin A1C No results for input(s): "HGBA1C" in the last 72 hours. Fasting Lipid Panel No  results for input(s): "CHOL", "HDL", "LDLCALC", "TRIG", "CHOLHDL", "LDLDIRECT" in the last 72 hours. Thyroid Function Tests No results for input(s): "TSH", "T4TOTAL", "T3FREE", "THYROIDAB" in the last 72 hours.  Invalid input(s): "FREET3" _____________    Disposition   Pt is being discharged home today in good condition.  Follow-up Plans & Appointments     Follow-up Information     Pricilla Riffle, MD Follow up on 02/02/2023.   Specialty: Cardiology Why: Keep scheduled follow-up for 02/02/2023 at 9:30 AM. Contact information: [address] [place] [address] [phone]                   Discharge Medications   Allergies as of 01/06/2023       Reactions   Allopurinol Other (See Comments)   PATIENT PREFERENCE Pt refused due to mom developing steven johnson syndrome.   Penicillins Hives   Has patient had a PCN reaction causing immediate rash, facial/tongue/throat swelling, SOB or lightheadedness with hypotension: Unknown Has patient had a PCN reaction causing severe rash involving mucus membranes or skin necrosis:Unknown Has patient had a PCN reaction that required hospitalization: No Has patient had a PCN reaction occurring within the last 10 years: No If all of the above answers are "NO", then may proceed with Cephalosporin use.   Sulfamethoxazole Hives   Sulfonamide Derivatives Hives   Dilaudid [hydromorphone Hcl] Nausea And Vomiting        Medication List     TAKE these medications    acetaminophen 500 MG tablet Commonly known as: TYLENOL Take 1,000 mg by mouth every 8 (eight) hours as needed for moderate pain or mild pain.   albuterol 108 (90 Base) MCG/ACT inhaler Commonly known as: ProAir HFA INHALE 2 PUFFS EVERY 4  HOURS AS NEEDED FOR  WHEEZING OR SHORTNESS OF  BREATH   Apple Cider Vinegar 600 MG Caps Take 600 mg by mouth at bedtime.   b complex vitamins capsule Take 1 capsule by mouth at bedtime. With vit B12   bisacodyl  5 MG EC tablet Commonly known as: DULCOLAX Take 5 mg by mouth at bedtime.   buPROPion 300 MG 24 hr tablet Commonly known as: WELLBUTRIN XL TAKE 1 TABLET BY MOUTH DAILY   Cholecalciferol 50 MCG (2000 UT) Tabs Take 2,000 Units by mouth at bedtime.   clindamycin 300 MG capsule Commonly known as: CLEOCIN Take 600 mg by mouth once.   clonazePAM 1 MG tablet Commonly known as: KLONOPIN Take 1 tablet (1 mg total) by mouth 2 (two) times daily as needed for anxiety. for anxiety What changed:  when to take this additional instructions   desvenlafaxine 50 MG 24 hr tablet Commonly known as: PRISTIQ TAKE 1 TABLET BY MOUTH DAILY What changed: when to take this   docusate sodium 100 MG capsule Commonly known as: Colace Take 1 capsule (100 mg total) by mouth 2 (two) times daily.   flecainide 50 MG tablet Commonly known as: TAMBOCOR Take 1.5 tablets (75 mg total) by mouth 2 (two) times daily.   furosemide 20 MG tablet Commonly known as:  LASIX TAKE 1 TABLET BY MOUTH  DAILY AS NEEDED   gabapentin 300 MG capsule Commonly known as: NEURONTIN TAKE 1 CAPSULE(300 MG) BY MOUTH AT BEDTIME   GENTEAL MILD OP Place 1 drop into both eyes daily as needed (dry eyes).   metoprolol tartrate 25 MG tablet Commonly known as: LOPRESSOR Take 2 tablets (50 mg total) by mouth 2 (two) times daily.   prazosin 2 MG capsule Commonly known as: MINIPRESS Take 1 capsule (2 mg total) by mouth at bedtime.   rivaroxaban 20 MG Tabs tablet Commonly known as: XARELTO Take 1 tablet (20 mg total) by mouth daily with supper.   senna 8.6 MG Tabs tablet Commonly known as: SENOKOT Take 1 tablet by mouth at bedtime. Vegetable   tamsulosin 0.4 MG Caps capsule Commonly known as: FLOMAX Take 1 capsule (0.4 mg total) by mouth daily.           Outstanding Labs/Studies   None  Duration of Discharge Encounter   Greater than 30 minutes including physician time.  Signed, Ellsworth Lennox, PA-C 01/06/2023,  11:28 AM    Patient seen and examined.  Agree with above documentation.  Joseph Tran is a 72 year old male with a history of persistent atrial fibrillation, chronic systolic heart failure (EF 40 to 45%), CKD stage III, OSA who presented for cardioversion yesterday.  Underwent successful cardioversion, and is maintaining sinus rhythm.  He will continue on Xarelto, flecainide, and Lopressor.  Has follow-up scheduled with Dr. Tenny Craw on 02/02/2023.  GEN:  in no acute distress HEENT: normal Neck: no JVD Cardiac: RRR; no murmurs, rubs, or gallops,no edema  Respiratory:  clear to auscultation bilaterally, normal work of breathing GI: soft, nontender MS: no deformity or atrophy Skin: warm and dry Neuro:  Alert and Oriented x 3, Strength and sensation are intact Psych: normal affect  Little Ishikawa, MD

## 2023-01-07 ENCOUNTER — Encounter (HOSPITAL_COMMUNITY): Payer: Self-pay | Admitting: Cardiology

## 2023-01-10 ENCOUNTER — Other Ambulatory Visit: Payer: Self-pay | Admitting: Family Medicine

## 2023-01-11 ENCOUNTER — Ambulatory Visit: Payer: Medicare Other | Admitting: Physical Therapy

## 2023-01-11 ENCOUNTER — Encounter: Payer: Self-pay | Admitting: Physical Therapy

## 2023-01-11 DIAGNOSIS — R252 Cramp and spasm: Secondary | ICD-10-CM | POA: Diagnosis not present

## 2023-01-11 DIAGNOSIS — M542 Cervicalgia: Secondary | ICD-10-CM | POA: Diagnosis not present

## 2023-01-11 NOTE — Therapy (Signed)
OUTPATIENT PHYSICAL THERAPY TREATMENT   Patient Name: Joseph Tran MRN: [id number] DOB:1951-06-10, 72 y.o., male Today's Date: 01/11/2023  END OF SESSION:  PT End of Session - 01/11/23 0853     Visit Number 6    Number of Visits 12    Date for PT Re-Evaluation 01/26/23    Authorization Type Medicare + AARP    PT Start Time [id number]    PT Stop Time 0936    PT Time Calculation (min) 44 min    Activity Tolerance Patient tolerated treatment well    Behavior During Therapy WFL for tasks assessed/performed             Past Medical History:  Diagnosis Date   Acoustic neuroma (HCC)    benign - left   Anxiety    Arthritis    Asthma    seasonal, when pollen is high, cold air closes me up   Cancer (HCC)    skin cancer -- arm, scalp, upper back   Chronic kidney disease    sees Dr. Jaye Beagle at  [facility], left kidney is non=functioning.  right kidny is at 50%   Clotting disorder (HCC)    Hx DVT - Xarelto   Colon polyps    Complication of anesthesia    Post op nausea/vomiting   Depression    severe.  dx 1985   DVT (deep venous thrombosis) (HCC)    sees Dr. Arlan Organ    History of kidney stones    has kidney stone now.     Hypertension    Hypogonadism male    sees Dr. Hadley Pen at [facility]   Neuropathy of both feet    OSA (obstructive sleep apnea)    Plantar fasciitis    rt foot   Pneumonia    PONV (postoperative nausea and vomiting)    Renal atrophy, left    sees Dr. Hadley Pen at [facility]   Sleep apnea    tested 2010  - wears c-pap   Past Surgical History:  Procedure Laterality Date   CARDIOVERSION N/A 01/05/2023   Procedure: CARDIOVERSION;  Surgeon: Jodelle Red, MD;  Location: [facility] INVASIVE CV LAB;  Service: Cardiovascular;  Laterality: N/A;   CHEST WALL TUMOR EXCISION  2007   COLONOSCOPY  04/07/2020   per Dr. Rhea Belton, adenomatous polyps, repeat in 3 yrs    kidney caluculs  2004-05   KIDNEY STONE SURGERY     x 2c    KNEE ARTHROSCOPY  09/10/2011   right knee, per Dr. Jodi Geralds    KNEE ARTHROSCOPY Right 04/25/2017   Procedure: RIGHT KNEE ARTHROSCOPY, PARTIAL LATERAL MENISECTOMY, CHONDROPLASTY MEDIAL AND LATERAL PATELLAOFEMORAL;  Surgeon: Jodi Geralds, MD;  Location: [facility] OR;  Service: Orthopedics;  Laterality: Right;   KNEE ARTHROSCOPY Left 09/18/2021   Procedure: ARTHROSCOPY KNEE;  Surgeon: Jodi Geralds, MD;  Location: [facility] ORS;  Service: Orthopedics;  Laterality: Left;   KNEE ARTHROSCOPY WITH MEDIAL MENISECTOMY Left 09/18/2021   Procedure: KNEE ARTHROSCOPY WITH MEDIAL MENISECTOMY;  Surgeon: Jodi Geralds, MD;  Location: [facility] ORS;  Service: Orthopedics;  Laterality: Left;   madiscus cartilage  2009   MOHS SURGERY     right knee arthroscopy  2008   TONSILLECTOMY     TOTAL KNEE ARTHROPLASTY Right 01/19/2019   Procedure: RIGHT TOTAL KNEE ARTHROPLASTY;  Surgeon: Jodi Geralds, MD;  Location: [facility];  Service: Orthopedics;  Laterality: Right;   TOTAL KNEE ARTHROPLASTY Left 06/14/2022   Procedure: TOTAL KNEE ARTHROPLASTY;  Surgeon: Jodi Geralds, MD;  Location: [facility];  Service: Orthopedics;  Laterality: Left;   UPPER GASTROINTESTINAL ENDOSCOPY     Patient Active Problem List   Diagnosis Date Noted   Essential hypertension 01/06/2023   Atrial fibrillation (HCC) 01/05/2023   Insomnia 11/09/2022   Paroxysmal atrial fibrillation (HCC) 11/09/2022   Vitamin B 12 deficiency 07/14/2022   Preoperative respiratory examination 04/26/2022   Morbid obesity (HCC) 03/26/2022   Primary osteoarthritis of left knee 09/18/2021   Plica of knee, left 09/18/2021   Complex tear of lat mensc, current injury, left knee, init 09/18/2021   Acute meniscal tear, medial, left, initial encounter 09/18/2021   Hyperglycemia 05/30/2019   Primary osteoarthritis of right knee 01/19/2019   CKD (chronic kidney disease), stage III (HCC) 09/13/2017   Renal atrophy, left 09/13/2017   Systemic lupus erythematosus (HCC) 09/13/2017   Dyslipidemia  09/13/2017   Acute meniscal tear, lateral, right, initial encounter 04/25/2017   Osteoarthritis of right knee 04/25/2017   Concussion with loss of consciousness 08/05/2015   Laceration of spleen 08/05/2015   Lumbar stress fracture 08/05/2015   DVT (deep venous thrombosis) (HCC) 10/18/2014   Hx of bacterial pneumonia 03/10/2013   COLONIC POLYPS, HX OF 04/08/2010   NEPHROLITHIASIS, HX OF 04/08/2010   ACOUSTIC NEUROMA 04/07/2010   Hypogonadism male 04/07/2010   Depression with anxiety 04/07/2010   SLEEP APNEA, OBSTRUCTIVE 04/07/2010   Asthma 04/07/2010   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   ERECTILE DYSFUNCTION, 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   PLANTAR FASCIITIS 04/07/2010    PCP: Nelwyn Salisbury, MD   REFERRING PROVIDER: Jodi Geralds, MD  REFERRING DIAG: M54.2 (ICD-10-CM) - Neck pain  THERAPY DIAG:  Cervicalgia  Cramp and spasm  Rationale for Evaluation and Treatment: Rehabilitation  ONSET DATE: Mid April 2024  SUBJECTIVE:                                                                                                                                                                                                         SUBJECTIVE STATEMENT: Had cardioversion on Friday, now feels a lot better, a lot more energy.    Last session did a lot of good, sore day of but following day, notices ROM better when driving.  Neck only hurts now when sitting in certain positions and end range rotation, right worse than left.    Hand dominance: Right  PERTINENT HISTORY:  R TKA 01/19/19; L knee arthroscopy with medial menisectomy 09/2021; Lupus; L acoustic neuroma - pt reports minor balance issues related to tumor; asthma; skin cancer; morbid obesity; CKD-III;  DVT 2016; anxiety; depression.  A-Fib.   PAIN:  Are you having pain? Yes: NPRS scale: 0/10 Pain location: posterior neck  Pain description: achey, headaches Aggravating factors: turn neck too fast or far Relieving factors: Tylenol, heat,  ice, hot showers, liniment but nothing lasts  PRECAUTIONS: Other: Possible Afib  WEIGHT BEARING RESTRICTIONS: No  FALLS:  Has patient fallen in last 6 months? Yes. 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 Luiz Blare  OBJECTIVE:   DIAGNOSTIC FINDINGS:  08/02/2015 CT Spine Cervical .  Alignment: Trace anterolisthesis of C2 on C3 is favored to be degenerative in nature, as well as trace anterolisthesis of C7 on T1.  .  Craniocervical junction: No evidence of fracture or dislocation.  .  Vertebrae: No acute fractures. Vertebral body heights maintained.  [name]  Spinal canal: No significant bony canal stenosis. No gross upper cervical canal hematoma.  .  Degenerative changes: Multilevel degenerative disc and facet disease, with disc disease most advanced spanning C5-C7 with posterior disc osteophyte complex formation and uncovertebral joint spurring resulting in varying degrees of spinal canal and neural foraminal narrowing. Most advanced degenerative facet disease is present on the right at C2-C3.   PATIENT SURVEYS:  NDI 13/50= 26%  COGNITION: Overall cognitive status: Within functional limits for tasks assessed  SENSATION: WFL  POSTURE:  decreased cervical lordosis  PALPATION: Tenderness/tightness throughout suboccipitals, cervical paraspinals and multifidi  CERVICAL ROM:   Active ROM AROM (deg) eval 01/04/23 AROM  Flexion 40 45  Extension 47 45   Right lateral flexion 15p! 15  Left lateral flexion 10p! 22  Right rotation 24p! 45p!  Left rotation 32p! 60   (Blank rows = not tested)  UPPER EXTREMITY ROM:  WNL, no pain   UPPER EXTREMITY MMT:  5/5 all mytomes, no pain. Good grip strength bil.   TODAY'S TREATMENT:                                                                                                                              DATE:   01/11/2023 Therapeutic Exercise: to improve strength and mobility.  Demo, verbal and tactile cues throughout for technique. Nustep L5 x 7 min  HR 54, O2 98% Manual Therapy: to decrease muscle spasm and pain and improve mobility STM/TPR to cervical paraspinals, PA and UPA mobs to cervical spine in supine, NAGs into rotation, skilled palpation and monitoring during dry needling;   Trigger  Point Dry-Needling  Treatment instructions: Expect mild to moderate muscle soreness. S/S of pneumothorax if dry needled over a lung field, and to seek immediate medical attention should they occur. Patient verbalized understanding of these instructions and education. Patient Consent Given: Yes Education handout provided: Previously provided Muscles treated: bil C3-C5 multifidi, R UT Treatment response/outcome: Twitch Response Elicited and Palpable Increase in Muscle Length     01/04/23  Therapeutic Exercise: to improve strength and mobility.  Demo, verbal and tactile cues throughout for technique. UBE L1 x 6 min Vitals 96% HR 96, returned to 75 after 1 min rest.  Review of HEP Manual Therapy: to decrease muscle spasm and pain and improve mobility STM/TPR to cervical paraspinals, PA and UPA mobs to cervical spine in supine, NAGs into rotation, skilled palpation and monitoring during dry needling;   Trigger Point Dry-Needling  Treatment instructions: Expect mild to moderate muscle soreness. S/S of pneumothorax if dry needled over a lung field, and to seek immediate medical attention should they occur. Patient  verbalized understanding of these instructions and education. Patient Consent Given: Yes Education handout provided: Previously provided Muscles treated: bil C3-C5 multifidi Treatment response/outcome: Twitch Response Elicited and Palpable Increase in Muscle Length  12/29/22 Therapeutic Exercise: to improve strength and mobility.  Demo, verbal and tactile cues throughout for technique. Vitals SpO2 96%, HR 70 Nustep L4 x 5 min  Cervical AROM rotation x 10 bil, flexion/extension x 10 bil, diagonals x 10 bil LS stretch 3 x 10 sec hold bil  Shoulder rolls retro x 10  Manual Therapy: to decrease muscle spasm and pain and improve mobility STM/TPR to cervical paraspinals, R levator scapulae, skilled palpation and monitoring during dry needling;   Trigger Point Dry-Needling  Treatment instructions: Expect mild to moderate muscle soreness. S/S of pneumothorax if dry needled over a lung field, and to seek immediate medical attention should they occur. Patient verbalized understanding of these instructions and education. Patient Consent Given: Yes Education handout provided: Previously provided Muscles treated: bil C3-C5 multifidi, R levator scapule Treatment response/outcome: Twitch Response Elicited and Palpable Increase in Muscle Length   PATIENT EDUCATION:  Education details:  continue HEP, monitor BP & HR   Person educated: Patient Education method: Explanation Education comprehension: verbalized understanding  HOME EXERCISE PROGRAM: Access Code: [email or web address] URL: [email or web address] Date: 12/27/2022 Prepared by: Harrie Foreman  Exercises - Seated Cervical Retraction  - 3 x daily - 7 x weekly - 1 sets - 10 reps - Cervical Extension AROM with Strap  - 3 x daily - 7 x weekly - 1 sets - 10 reps - Gentle Levator Scapulae Stretch  - 1 x daily - 7 x weekly - 1 sets - 3 reps - 10-30 sec hold - Sternocleidomastoid Stretch  - 1 x daily - 7 x weekly - 1 sets - 3 reps - 10-30 sec  hold  ASSESSMENT:  CLINICAL IMPRESSION: Myrick "Ron" reports continued improvement in cervical AROM especially with driving, pain mostly only at end range.   Reporting improved exercise tolerance after cardioversion but heart rate very low, he will contact cardiologist as he has also noted this at home, as well as increased BP.  After manual therapy reported improved rotation to Right.   NOLON YELLIN continues to demonstrate potential for improvement and would benefit from continued skilled therapy to address impairments.      OBJECTIVE IMPAIRMENTS: decreased mobility, hypomobility, increased fascial restrictions, increased muscle spasms, postural dysfunction, and pain.   ACTIVITY LIMITATIONS: lifting, bending, and sleeping  PARTICIPATION LIMITATIONS: driving, shopping, and community activity  PERSONAL FACTORS: Age and 3+ comorbidities: R TKA 01/19/19; L knee arthroscopy with medial menisectomy 09/2021; Lupus; L acoustic neuroma; asthma; skin cancer; morbid obesity; CKD-III; DVT 2016; anxiety; depression; arthritis   are also affecting patient's functional outcome.   REHAB POTENTIAL: Good  CLINICAL DECISION MAKING: Evolving/moderate complexity  EVALUATION COMPLEXITY: Moderate   GOALS: Goals reviewed with patient? Yes  SHORT TERM GOALS: Target date: 12/29/2022   Patient will be independent with initial HEP.  Baseline:  Goal status: MET 12/29/22   LONG TERM GOALS: Target date: 01/26/2023   Patient will be independent with advanced/ongoing HEP to improve outcomes and carryover.  Baseline:   Goal status: IN PROGRESS  2.  Patient will report 75% improvement in neck pain to improve QOL.  Baseline:   Goal status: IN PROGRESS  3.  Patient will demonstrate full pain free cervical ROM for safety with driving.  Baseline: see objective Goal status: IN PROGRESS  4.  Patient will report 7.5 points or more improvement  on QuickDash to demonstrate improved functional ability.  Baseline:  13/50 Goal status: IN PROGRESS  PLAN:  PT FREQUENCY: 1-2x/week  PT DURATION: 6 weeks  PLANNED INTERVENTIONS: Therapeutic exercises, Therapeutic activity, Neuromuscular re-education, Balance training, Gait training, Patient/Family education, Self Care, Joint mobilization, Dry Needling, Electrical stimulation, Spinal manipulation, Spinal mobilization, Cryotherapy, Moist heat, Traction, Ultrasound, Manual therapy, and Re-evaluation  PLAN FOR NEXT SESSION: continue to review and progress exercises, manual therapy including TrDN as indicated and benefit noted   Jena Gauss, PT, DPT 01/11/2023, 10:44 AM

## 2023-01-13 ENCOUNTER — Ambulatory Visit: Payer: Medicare Other

## 2023-01-13 DIAGNOSIS — M542 Cervicalgia: Secondary | ICD-10-CM | POA: Diagnosis not present

## 2023-01-13 DIAGNOSIS — R252 Cramp and spasm: Secondary | ICD-10-CM

## 2023-01-13 NOTE — Therapy (Signed)
OUTPATIENT PHYSICAL THERAPY TREATMENT   Patient Name: Joseph Tran MRN: [id number] DOB:08/14/1950, 72 y.o., male Today's Date: 01/13/2023  END OF SESSION:  PT End of Session - 01/13/23 0920     Visit Number 7    Number of Visits 12    Date for PT Re-Evaluation 01/26/23    Authorization Type Medicare + [employer]    PT Start Time 0848    PT Stop Time 0932    PT Time Calculation (min) 44 min    Activity Tolerance Patient tolerated treatment well    Behavior During Therapy WFL for tasks assessed/performed              Past Medical History:  Diagnosis Date   Acoustic neuroma (HCC)    benign - left   Anxiety    Arthritis    Asthma    seasonal, when pollen is high, cold air closes me up   Cancer (HCC)    skin cancer -- arm, scalp, upper back   Chronic kidney disease    sees Dr. Jaye Beagle at [facility], left kidney is non=functioning.  right kidny is at 50%   Clotting disorder (HCC)    Hx DVT - Xarelto   Colon polyps    Complication of anesthesia    Post op nausea/vomiting   Depression    severe.  dx 1985   DVT (deep venous thrombosis) (HCC)    sees Dr. Arlan Organ    History of kidney stones    has kidney stone now.     Hypertension    Hypogonadism male    sees Dr. Hadley Pen at [facility]   Neuropathy of both feet    OSA (obstructive sleep apnea)    Plantar fasciitis    rt foot   Pneumonia    PONV (postoperative nausea and vomiting)    Renal atrophy, left    sees Dr. Hadley Pen at [facility]   Sleep apnea    tested 2010  - wears c-pap   Past Surgical History:  Procedure Laterality Date   CARDIOVERSION N/A 01/05/2023   Procedure: CARDIOVERSION;  Surgeon: Jodelle Red, MD;  Location: [facility] INVASIVE CV LAB;  Service: Cardiovascular;  Laterality: N/A;   CHEST WALL TUMOR EXCISION  2007   COLONOSCOPY  04/07/2020   per Dr. Rhea Belton, adenomatous polyps, repeat in 3 yrs    kidney caluculs  2004-05   KIDNEY STONE SURGERY     x 2c    KNEE ARTHROSCOPY  09/10/2011   right knee, per Dr. Jodi Geralds    KNEE ARTHROSCOPY Right 04/25/2017   Procedure: RIGHT KNEE ARTHROSCOPY, PARTIAL LATERAL MENISECTOMY, CHONDROPLASTY MEDIAL AND LATERAL PATELLAOFEMORAL;  Surgeon: Jodi Geralds, MD;  Location: [facility] OR;  Service: Orthopedics;  Laterality: Right;   KNEE ARTHROSCOPY Left 09/18/2021   Procedure: ARTHROSCOPY KNEE;  Surgeon: Jodi Geralds, MD;  Location: [facility]S;  Service: Orthopedics;  Laterality: Left;   KNEE ARTHROSCOPY WITH MEDIAL MENISECTOMY Left 09/18/2021   Procedure: KNEE ARTHROSCOPY WITH MEDIAL MENISECTOMY;  Surgeon: Jodi Geralds, MD;  Location: [facility];  Service: Orthopedics;  Laterality: Left;   madiscus cartilage  2009   MOHS SURGERY     right knee arthroscopy  2008   TONSILLECTOMY     TOTAL KNEE ARTHROPLASTY Right 01/19/2019   Procedure: RIGHT TOTAL KNEE ARTHROPLASTY;  Surgeon: Jodi Geralds, MD;  Location: [facility];  Service: Orthopedics;  Laterality: Right;   TOTAL KNEE ARTHROPLASTY Left 06/14/2022   Procedure: TOTAL KNEE ARTHROPLASTY;  Surgeon: Jodi Geralds, MD;  Location: [facility];  Service: Orthopedics;  Laterality: Left;   UPPER GASTROINTESTINAL ENDOSCOPY     Patient Active Problem List   Diagnosis Date Noted   Essential hypertension 01/06/2023   Atrial fibrillation (HCC) 01/05/2023   Insomnia 11/09/2022   Paroxysmal atrial fibrillation (HCC) 11/09/2022   Vitamin B 12 deficiency 07/14/2022   Preoperative respiratory examination 04/26/2022   Morbid obesity (HCC) 03/26/2022   Primary osteoarthritis of left knee 09/18/2021   Plica of knee, left 09/18/2021   Complex tear of lat mensc, current injury, left knee, init 09/18/2021   Acute meniscal tear, medial, left, initial encounter 09/18/2021   Hyperglycemia 05/30/2019   Primary osteoarthritis of right knee 01/19/2019   CKD (chronic kidney disease), stage III (HCC) 09/13/2017   Renal atrophy, left 09/13/2017   Systemic lupus erythematosus (HCC) 09/13/2017    Dyslipidemia 09/13/2017   Acute meniscal tear, lateral, right, initial encounter 04/25/2017   Osteoarthritis of right knee 04/25/2017   Concussion with loss of consciousness 08/05/2015   Laceration of spleen 08/05/2015   Lumbar stress fracture 08/05/2015   DVT (deep venous thrombosis) (HCC) 10/18/2014   Hx of bacterial pneumonia 03/10/2013   COLONIC POLYPS, HX OF 04/08/2010   NEPHROLITHIASIS, HX OF 04/08/2010   ACOUSTIC NEUROMA 04/07/2010   Hypogonadism male 04/07/2010   Depression with anxiety 04/07/2010   SLEEP APNEA, OBSTRUCTIVE 04/07/2010   Asthma 04/07/2010   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   ERECTILE DYSFUNCTION, 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   PLANTAR FASCIITIS 04/07/2010    PCP: Nelwyn Salisbury, MD   REFERRING PROVIDER: Jodi Geralds, MD  REFERRING DIAG: M54.2 (ICD-10-CM) - Neck pain  THERAPY DIAG:  Cervicalgia  Cramp and spasm  Rationale for Evaluation and Treatment: Rehabilitation  ONSET DATE: Mid April 2024  SUBJECTIVE:                                                                                                                                                                                                         SUBJECTIVE STATEMENT: Pt notes great improvement in neck AROM.   Hand dominance: Right  PERTINENT HISTORY:  R TKA 01/19/19; L knee arthroscopy with medial menisectomy 09/2021; Lupus; L acoustic neuroma - pt reports minor balance issues related to tumor; asthma; skin cancer; morbid obesity; CKD-III; DVT 2016; anxiety; depression.  A-Fib.   PAIN:  Are you having pain? Yes: NPRS scale: 0/10 Pain location: posterior neck  Pain description: achey, headaches Aggravating factors: turn neck too fast or far Relieving factors: Tylenol, heat, ice, hot showers, liniment but nothing lasts  PRECAUTIONS: Other: Possible Afib  WEIGHT BEARING RESTRICTIONS: No  FALLS:  Has patient fallen in last 6 months? Yes. 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 Luiz Blare  OBJECTIVE:   DIAGNOSTIC FINDINGS:  08/02/2015 CT Spine Cervical .  Alignment: Trace anterolisthesis of C2 on C3 is favored to be degenerative in nature, as well as trace anterolisthesis of C7 on T1.  .  Craniocervical junction: No evidence of fracture or dislocation.  .  Vertebrae: No acute fractures. Vertebral body heights maintained.  [name]  Spinal canal: No significant bony canal stenosis. No gross upper cervical canal hematoma.  .  Degenerative changes: Multilevel degenerative disc and facet disease, with disc disease most advanced spanning C5-C7 with posterior disc osteophyte complex formation and uncovertebral joint spurring resulting in varying degrees of spinal canal and neural foraminal narrowing. Most advanced degenerative facet disease is present on the right at C2-C3.   PATIENT SURVEYS:  NDI 13/50= 26%  COGNITION: Overall cognitive status: Within functional limits for tasks assessed  SENSATION: WFL  POSTURE:  decreased cervical lordosis  PALPATION: Tenderness/tightness throughout suboccipitals, cervical paraspinals and multifidi  CERVICAL ROM:   Active ROM AROM (deg) eval 01/04/23 AROM  Flexion 40 45  Extension 47 45  Right lateral flexion 15p! 15  Left lateral flexion 10p! 22  Right rotation 24p! 45p!  Left rotation 32p! 60   (Blank rows = not tested)  UPPER EXTREMITY ROM:  WNL, no pain   UPPER EXTREMITY MMT:   5/5 all mytomes, no pain. Good grip strength bil.   TODAY'S TREATMENT:                                                                                                                              DATE:  01/13/23 Therapeutic Exercise: to improve strength and mobility.  Demo, verbal and tactile cues throughout for technique. UBE L1.5 x Seated SNAG cervical rotation AAROM 10x3" both sides Seated cervical extension with pillowcase 10x3" Standing cervical retraction with ball on wall 2x10 Standing ER B with RTB 2x10 back to wall B UT and cervical flexion stretch x 30 sec each Manual Therapy: to decrease muscle spasm and pain and improve mobility STM to bil UT,LS, cervical paraspinals  01/11/2023 Therapeutic Exercise: to improve strength and mobility.  Demo, verbal and tactile cues throughout for technique. Nustep L5 x 7 min  HR 54, O2 98% Manual Therapy: to decrease muscle  spasm and pain and improve mobility STM/TPR to cervical paraspinals, PA and UPA mobs to cervical spine in supine, NAGs into rotation, skilled palpation and monitoring during dry needling;   Trigger Point Dry-Needling  Treatment instructions: Expect mild to moderate muscle soreness. S/S of pneumothorax if dry needled over a lung field, and to seek immediate medical attention should they occur. Patient verbalized understanding of these instructions and education. Patient Consent Given: Yes Education handout provided: Previously provided Muscles treated: bil C3-C5 multifidi, R UT Treatment response/outcome: Twitch Response Elicited and Palpable Increase in Muscle Length     01/04/23  Therapeutic Exercise: to improve strength and mobility.  Demo, verbal and tactile cues throughout for technique. UBE L1 x 6 min Vitals 96% HR 96, returned to 75 after 1 min rest.  Review of HEP Manual Therapy: to decrease muscle spasm and pain and improve mobility STM/TPR to cervical paraspinals, PA and UPA mobs to cervical spine in supine,  NAGs into rotation, skilled palpation and monitoring during dry needling;   Trigger Point Dry-Needling  Treatment instructions: Expect mild to moderate muscle soreness. S/S of pneumothorax if dry needled over a lung field, and to seek immediate medical attention should they occur. Patient verbalized understanding of these instructions and education. Patient Consent Given: Yes Education handout provided: Previously provided Muscles treated: bil C3-C5 multifidi Treatment response/outcome: Twitch Response Elicited and Palpable Increase in Muscle Length  12/29/22 Therapeutic Exercise: to improve strength and mobility.  Demo, verbal and tactile cues throughout for technique. Vitals SpO2 96%, HR 70 Nustep L4 x 5 min  Cervical AROM rotation x 10 bil, flexion/extension x 10 bil, diagonals x 10 bil LS stretch 3 x 10 sec hold bil  Shoulder rolls retro x 10  Manual Therapy: to decrease muscle spasm and pain and improve mobility STM/TPR to cervical paraspinals, R levator scapulae, skilled palpation and monitoring during dry needling;   Trigger Point Dry-Needling  Treatment instructions: Expect mild to moderate muscle soreness. S/S of pneumothorax if dry needled over a lung field, and to seek immediate medical attention should they occur. Patient verbalized understanding of these instructions and education. Patient Consent Given: Yes Education handout provided: Previously provided Muscles treated: bil C3-C5 multifidi, R levator scapule Treatment response/outcome: Twitch Response Elicited and Palpable Increase in Muscle Length   PATIENT EDUCATION:  Education details:  continue HEP, monitor BP & HR   Person educated: Patient Education method: Explanation Education comprehension: verbalized understanding  HOME EXERCISE PROGRAM: Access Code: [email or web address] URL: [email or web address] Date: 12/27/2022 Prepared by: Harrie Foreman  Exercises - Seated Cervical Retraction  - 3 x daily - 7 x  weekly - 1 sets - 10 reps - Cervical Extension AROM with Strap  - 3 x daily - 7 x weekly - 1 sets - 10 reps - Gentle Levator Scapulae Stretch  - 1 x daily - 7 x weekly - 1 sets - 3 reps - 10-30 sec hold - Sternocleidomastoid Stretch  - 1 x daily - 7 x weekly - 1 sets - 3 reps - 10-30 sec hold  ASSESSMENT:  CLINICAL IMPRESSION: Pt noted great improvement in cervical AROM. We progressed postural exercises and cervical ROM. Cues for keeping elbows to side with ER with resistance band. Followed with MT to further reduce tension in his neck and shoulders. He is more limited with R cervical rotation, L side is close to full ROM. Joseph Tran continues to demonstrate potential for improvement and would benefit from continued skilled therapy to address impairments.  OBJECTIVE IMPAIRMENTS: decreased mobility, hypomobility, increased fascial restrictions, increased muscle spasms, postural dysfunction, and pain.   ACTIVITY LIMITATIONS: lifting, bending, and sleeping  PARTICIPATION LIMITATIONS: driving, shopping, and community activity  PERSONAL FACTORS: Age and 3+ comorbidities: R TKA 01/19/19; L knee arthroscopy with medial menisectomy 09/2021; Lupus; L acoustic neuroma; asthma; skin cancer; morbid obesity; CKD-III; DVT 2016; anxiety; depression; arthritis   are also affecting patient's functional outcome.   REHAB POTENTIAL: Good  CLINICAL DECISION MAKING: Evolving/moderate complexity  EVALUATION COMPLEXITY: Moderate   GOALS: Goals reviewed with patient? Yes  SHORT TERM GOALS: Target date: 12/29/2022   Patient will be independent with initial HEP.  Baseline:  Goal status: MET 12/29/22   LONG TERM GOALS: Target date: 01/26/2023   Patient will be independent with advanced/ongoing HEP to improve outcomes and carryover.  Baseline:   Goal status: IN PROGRESS- met for current HEP 01/13/23  2.  Patient will report 75% improvement in neck pain to improve QOL.  Baseline:   Goal status: IN  PROGRESS  3.  Patient will demonstrate full pain free cervical ROM for safety with driving.  Baseline: see objective Goal status: IN PROGRESS  4.  Patient will report 7.5 points or more improvement  on QuickDash to demonstrate improved functional ability.  Baseline: 13/50 Goal status: IN PROGRESS  PLAN:  PT FREQUENCY: 1-2x/week  PT DURATION: 6 weeks  PLANNED INTERVENTIONS: Therapeutic exercises, Therapeutic activity, Neuromuscular re-education, Balance training, Gait training, Patient/Family education, Self Care, Joint mobilization, Dry Needling, Electrical stimulation, Spinal manipulation, Spinal mobilization, Cryotherapy, Moist heat, Traction, Ultrasound, Manual therapy, and Re-evaluation  PLAN FOR NEXT SESSION: continue to review and progress exercises, manual therapy including TrDN as indicated and benefit noted   Darleene Cleaver, PTA 01/13/2023, 9:50 AM

## 2023-01-17 ENCOUNTER — Encounter: Payer: Self-pay | Admitting: Internal Medicine

## 2023-01-17 NOTE — Telephone Encounter (Signed)
It could be the increase in metoprolol dose (carvedilol has similar incidence of diarrhea), maybe added to stopping the diltiazem (which tends to lean towards constipating).   Could start with some imodium and see if it resolves

## 2023-01-18 ENCOUNTER — Encounter: Payer: Self-pay | Admitting: Physical Therapy

## 2023-01-18 ENCOUNTER — Ambulatory Visit: Payer: Medicare Other | Admitting: Physical Therapy

## 2023-01-18 DIAGNOSIS — R252 Cramp and spasm: Secondary | ICD-10-CM | POA: Diagnosis not present

## 2023-01-18 DIAGNOSIS — M542 Cervicalgia: Secondary | ICD-10-CM | POA: Diagnosis not present

## 2023-01-18 DIAGNOSIS — M79672 Pain in left foot: Secondary | ICD-10-CM | POA: Diagnosis not present

## 2023-01-18 DIAGNOSIS — M25551 Pain in right hip: Secondary | ICD-10-CM | POA: Diagnosis not present

## 2023-01-18 MED ORDER — DILTIAZEM HCL ER COATED BEADS 240 MG PO CP24: 240.0000 mg | ORAL_CAPSULE | Freq: Every day | ORAL | 3 refills | Status: DC

## 2023-01-18 NOTE — Therapy (Signed)
OUTPATIENT PHYSICAL THERAPY TREATMENT   Patient Name: Joseph Tran MRN: [id number] DOB:June 25, 1951, 72 y.o., male Today's Date: 01/18/2023  END OF SESSION:  PT End of Session - 01/18/23 0853     Visit Number 8    Number of Visits 12    Date for PT Re-Evaluation 01/26/23    Authorization Type Medicare + AARP    PT Start Time [phone]    PT Stop Time 0935    PT Time Calculation (min) 44 min    Activity Tolerance Patient tolerated treatment well    Behavior During Therapy WFL for tasks assessed/performed              Past Medical History:  Diagnosis Date   Acoustic neuroma (HCC)    benign - left   Anxiety    Arthritis    Asthma    seasonal, when pollen is high, cold air closes me up   Cancer (HCC)    skin cancer -- arm, scalp, upper back   Chronic kidney disease    sees Dr. Jaye Beagle at [facility], left kidney is non=functioning.  right kidny is at 50%   Clotting disorder (HCC)    Hx DVT - Xarelto   Colon polyps    Complication of anesthesia    Post op nausea/vomiting   Depression    severe.  dx 1985   DVT (deep venous thrombosis) (HCC)    sees Dr. Arlan Organ    History of kidney stones    has kidney stone now.     Hypertension    Hypogonadism male    sees Dr. Hadley Pen at [facility]   Neuropathy of both feet    OSA (obstructive sleep apnea)    Plantar fasciitis    rt foot   Pneumonia    PONV (postoperative nausea and vomiting)    Renal atrophy, left    sees Dr. Hadley Pen at [facility]   Sleep apnea    tested 2010  - wears c-pap   Past Surgical History:  Procedure Laterality Date   CARDIOVERSION N/A 01/05/2023   Procedure: CARDIOVERSION;  Surgeon: Jodelle Red, MD;  Location: [facility] INVASIVE CV LAB;  Service: Cardiovascular;  Laterality: N/A;   CHEST WALL TUMOR EXCISION  2007   COLONOSCOPY  04/07/2020   per Dr. Rhea Belton, adenomatous polyps, repeat in 3 yrs    kidney caluculs  2004-05   KIDNEY STONE SURGERY     x 2c    KNEE ARTHROSCOPY  09/10/2011   right knee, per Dr. Jodi Geralds    KNEE ARTHROSCOPY Right 04/25/2017   Procedure: RIGHT KNEE ARTHROSCOPY, PARTIAL LATERAL MENISECTOMY, CHONDROPLASTY MEDIAL AND LATERAL PATELLAOFEMORAL;  Surgeon: Jodi Geralds, MD;  Location: [facility] OR;  Service: Orthopedics;  Laterality: Right;   KNEE ARTHROSCOPY Left 09/18/2021   Procedure: ARTHROSCOPY KNEE;  Surgeon: Jodi Geralds, MD;  Location: [facility]S;  Service: Orthopedics;  Laterality: Left;   KNEE ARTHROSCOPY WITH MEDIAL MENISECTOMY Left 09/18/2021   Procedure: KNEE ARTHROSCOPY WITH MEDIAL MENISECTOMY;  Surgeon: Jodi Geralds, MD;  Location: [facility];  Service: Orthopedics;  Laterality: Left;   madiscus cartilage  2009   MOHS SURGERY     right knee arthroscopy  2008   TONSILLECTOMY     TOTAL KNEE ARTHROPLASTY Right 01/19/2019   Procedure: RIGHT TOTAL KNEE ARTHROPLASTY;  Surgeon: Jodi Geralds, MD;  Location: [facility];  Service: Orthopedics;  Laterality: Right;   TOTAL KNEE ARTHROPLASTY Left 06/14/2022   Procedure: TOTAL KNEE ARTHROPLASTY;  Surgeon: Jodi Geralds, MD;  Location: [facility];  Service: Orthopedics;  Laterality: Left;   UPPER GASTROINTESTINAL ENDOSCOPY     Patient Active Problem List   Diagnosis Date Noted   Essential hypertension 01/06/2023   Atrial fibrillation (HCC) 01/05/2023   Insomnia 11/09/2022   Paroxysmal atrial fibrillation (HCC) 11/09/2022   Vitamin B 12 deficiency 07/14/2022   Preoperative respiratory examination 04/26/2022   Morbid obesity (HCC) 03/26/2022   Primary osteoarthritis of left knee 09/18/2021   Plica of knee, left 09/18/2021   Complex tear of lat mensc, current injury, left knee, init 09/18/2021   Acute meniscal tear, medial, left, initial encounter 09/18/2021   Hyperglycemia 05/30/2019   Primary osteoarthritis of right knee 01/19/2019   CKD (chronic kidney disease), stage III (HCC) 09/13/2017   Renal atrophy, left 09/13/2017   Systemic lupus erythematosus (HCC) 09/13/2017    Dyslipidemia 09/13/2017   Acute meniscal tear, lateral, right, initial encounter 04/25/2017   Osteoarthritis of right knee 04/25/2017   Concussion with loss of consciousness 08/05/2015   Laceration of spleen 08/05/2015   Lumbar stress fracture 08/05/2015   DVT (deep venous thrombosis) (HCC) 10/18/2014   Hx of bacterial pneumonia 03/10/2013   COLONIC POLYPS, HX OF 04/08/2010   NEPHROLITHIASIS, HX OF 04/08/2010   ACOUSTIC NEUROMA 04/07/2010   Hypogonadism male 04/07/2010   Depression with anxiety 04/07/2010   SLEEP APNEA, OBSTRUCTIVE 04/07/2010   Asthma 04/07/2010   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   ERECTILE DYSFUNCTION, 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   PLANTAR FASCIITIS 04/07/2010    PCP: Nelwyn Salisbury, MD   REFERRING PROVIDER: Jodi Geralds, MD  REFERRING DIAG: M54.2 (ICD-10-CM) - Neck pain  THERAPY DIAG:  Cervicalgia  Cramp and spasm  Rationale for Evaluation and Treatment: Rehabilitation  ONSET DATE: Mid April 2024  SUBJECTIVE:                                                                                                                                                                                                         SUBJECTIVE STATEMENT: Neck is doing quite well.  Still hurts if drive for very long but not debilitating.   Wobbly today, hasn't eaten since Sunday.    Hand dominance: Right  PERTINENT HISTORY:  R TKA 01/19/19; L knee arthroscopy with medial menisectomy 09/2021; Lupus; L acoustic neuroma - pt reports minor balance issues related to tumor; asthma; skin cancer; morbid obesity; CKD-III; DVT 2016; anxiety; depression.  A-Fib.   PAIN:  Are you having pain? Yes: NPRS scale: 0/10 Pain location: posterior neck  Pain description: achey, headaches Aggravating  factors: turn neck too fast or far Relieving factors: Tylenol, heat, ice, hot showers, liniment but nothing lasts  PRECAUTIONS: Other: Possible Afib  WEIGHT BEARING RESTRICTIONS: No  FALLS:  Has  patient fallen in last 6 months? Yes. 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 Luiz Blare  OBJECTIVE:   DIAGNOSTIC FINDINGS:  08/02/2015 CT Spine Cervical .  Alignment: Trace anterolisthesis of C2 on C3 is favored to be degenerative in nature, as well as trace anterolisthesis of C7 on T1.  .  Craniocervical junction: No evidence of fracture or dislocation.  .  Vertebrae: No acute fractures. Vertebral body heights maintained.  [name]  Spinal canal: No significant bony canal stenosis. No gross upper cervical canal hematoma.  .  Degenerative changes: Multilevel degenerative disc and facet disease, with disc disease most advanced spanning C5-C7 with posterior disc osteophyte complex formation and uncovertebral joint spurring resulting in varying degrees of spinal canal and neural foraminal narrowing. Most advanced degenerative facet disease is present on the right at C2-C3.   PATIENT SURVEYS:  NDI 13/50= 26%  COGNITION: Overall cognitive status: Within functional limits for tasks assessed  SENSATION: WFL  POSTURE:  decreased cervical lordosis  PALPATION: Tenderness/tightness throughout suboccipitals, cervical paraspinals and multifidi  CERVICAL ROM:   Active ROM AROM (deg) eval 01/04/23 AROM 01/18/2023 AROM  Flexion 40 45 60  Extension 47 45 45  Right lateral flexion 15p! 15 30  Left lateral flexion 10p! 22 35  Right rotation 24p! 45p! 60  Left  rotation 32p! 60 70   (Blank rows = not tested)  UPPER EXTREMITY ROM:  WNL, no pain   UPPER EXTREMITY MMT:  5/5 all mytomes, no pain. Good grip strength bil.   TODAY'S TREATMENT:                                                                                                                              DATE:   01/18/23 Therapeutic Exercise: to improve strength and mobility.  Demo, verbal and tactile cues throughout for technique. UBE L1 x 5 min  SCM stretches for R Review of HEP Manual Therapy: to decrease muscle spasm and pain and improve mobility IASTM with s/s tool to R anterior scalenes and SCM, MWM to R, STM/TPR to anterior scalenes, SCM, cervical paraspinals.     01/13/23 Therapeutic Exercise: to improve strength and mobility.  Demo, verbal and tactile cues throughout for technique. UBE L1.5  x Seated SNAG cervical rotation AAROM 10x3" both sides Seated cervical extension with pillowcase 10x3" Standing cervical retraction with ball on wall 2x10 Standing ER B with RTB 2x10 back to wall B UT and cervical flexion stretch x 30 sec each Manual Therapy: to decrease muscle spasm and pain and improve mobility STM to bil UT,LS, cervical paraspinals  01/11/2023 Therapeutic Exercise: to improve strength and mobility.  Demo, verbal and tactile cues throughout for technique. Nustep L5 x 7 min  HR 54, O2 98% Manual Therapy: to decrease muscle spasm and pain and improve mobility STM/TPR to cervical paraspinals, PA and UPA mobs to cervical spine in supine, NAGs into rotation, skilled palpation and monitoring during dry needling;   Trigger Point Dry-Needling  Treatment instructions: Expect mild to moderate muscle soreness. S/S of pneumothorax if dry needled over a lung field, and to seek immediate medical attention should they occur. Patient verbalized understanding of these instructions and education. Patient Consent Given: Yes Education handout provided: Previously provided Muscles  treated: bil C3-C5 multifidi, R UT Treatment response/outcome: Twitch Response Elicited and Palpable Increase in Muscle Length   PATIENT EDUCATION:  Education details:  reviewed HEP   Person educated: Patient Education method: Explanation Education comprehension: verbalized understanding  HOME EXERCISE PROGRAM: Access Code: [email or web address] URL: [email or web address] Date: 12/27/2022 Prepared by: Harrie Foreman  Exercises - Seated Cervical Retraction  - 3 x daily - 7 x weekly - 1 sets - 10 reps - Cervical Extension AROM with Strap  - 3 x daily - 7 x weekly - 1 sets - 10 reps - Gentle Levator Scapulae Stretch  - 1 x daily - 7 x weekly - 1 sets - 3 reps - 10-30 sec hold - Sternocleidomastoid Stretch  - 1 x daily - 7 x weekly - 1 sets - 3 reps - 10-30 sec hold  ASSESSMENT:  CLINICAL IMPRESSION: Joseph Tran is making good progress, reports 60-70% improvement overall, and significant improvement in pain.  He reports pain now primarily after driving long distances.  His AROM has improved significantly, WNL and safe for driving, without pain at end range.  Still limited with turning to right compared to Left, but improved after manual therapy today.  Today focused on manual therapy to anterior scalenes and SCM followed by review of SCM stretch.   He has follow-up with his referring MD tomorrow.  Will try to see for 1 more visit if schedule allows, but if not indicates willingness to go on 30 day hold.  Joseph Tran continues to demonstrate potential for improvement and would benefit from continued skilled therapy to address impairments.      OBJECTIVE IMPAIRMENTS: decreased mobility, hypomobility, increased fascial restrictions, increased muscle spasms, postural dysfunction, and pain.   ACTIVITY LIMITATIONS: lifting, bending, and sleeping  PARTICIPATION LIMITATIONS: driving, shopping, and community activity  PERSONAL FACTORS: Age and 3+ comorbidities: R TKA 01/19/19; L knee  arthroscopy with medial menisectomy 09/2021; Lupus; L acoustic neuroma; asthma; skin cancer; morbid obesity; CKD-III; DVT 2016; anxiety; depression; arthritis   are also affecting patient's functional outcome.   REHAB POTENTIAL: Good  CLINICAL DECISION MAKING: Evolving/moderate complexity  EVALUATION COMPLEXITY: Moderate   GOALS: Goals reviewed with patient? Yes  SHORT TERM GOALS: Target date: 12/29/2022   Patient will be independent with initial HEP.  Baseline:  Goal status: MET 12/29/22   LONG TERM GOALS: Target date: 01/26/2023   Patient will be independent with advanced/ongoing HEP to improve outcomes and carryover.  Baseline:   Goal status:  IN PROGRESS- met for current HEP 01/13/23  2.  Patient will report 75% improvement in neck pain to improve QOL.  Baseline:   Goal status: IN PROGRESS   01/18/23- 60-70%  3.  Patient will demonstrate full pain free cervical ROM for safety with driving.  Baseline: see objective Goal status: IN PROGRESS 01/18/23-see objective full to left, almost full for right  4.  Patient will report 7.5 points or more improvement  on QuickDash to demonstrate improved functional ability.  Baseline: 13/50 Goal status: IN PROGRESS  PLAN:  PT FREQUENCY: 1-2x/week  PT DURATION: 6 weeks  PLANNED INTERVENTIONS: Therapeutic exercises, Therapeutic activity, Neuromuscular re-education, Balance training, Gait training, Patient/Family education, Self Care, Joint mobilization, Dry Needling, Electrical stimulation, Spinal manipulation, Spinal mobilization, Cryotherapy, Moist heat, Traction, Ultrasound, Manual therapy, and Re-evaluation  PLAN FOR NEXT SESSION: add resisted isometrics to HEP to build endurance, NDI, 30 day hold.   Jena Gauss, PT 01/18/2023, 10:07 AM

## 2023-01-18 NOTE — Telephone Encounter (Signed)
Yes, lets d/c metoprolol and start diltiazem 240 mg daily.  Use Imodium prn

## 2023-01-24 ENCOUNTER — Ambulatory Visit: Payer: Medicare Other | Attending: Cardiovascular Disease

## 2023-01-24 ENCOUNTER — Ambulatory Visit: Payer: Medicare Other

## 2023-01-24 ENCOUNTER — Other Ambulatory Visit: Payer: Medicare Other

## 2023-01-24 ENCOUNTER — Telehealth: Payer: Self-pay | Admitting: Internal Medicine

## 2023-01-24 ENCOUNTER — Other Ambulatory Visit (HOSPITAL_BASED_OUTPATIENT_CLINIC_OR_DEPARTMENT_OTHER): Payer: Self-pay

## 2023-01-24 ENCOUNTER — Other Ambulatory Visit: Payer: Self-pay

## 2023-01-24 VITALS — BP 167/89 | HR 74 | Resp 12 | Wt 260.0 lb

## 2023-01-24 DIAGNOSIS — I10 Essential (primary) hypertension: Secondary | ICD-10-CM

## 2023-01-24 DIAGNOSIS — N1831 Chronic kidney disease, stage 3a: Secondary | ICD-10-CM | POA: Insufficient documentation

## 2023-01-24 DIAGNOSIS — I4811 Longstanding persistent atrial fibrillation: Secondary | ICD-10-CM

## 2023-01-24 DIAGNOSIS — Z79899 Other long term (current) drug therapy: Secondary | ICD-10-CM

## 2023-01-24 MED ORDER — HYDRALAZINE HCL 25 MG PO TABS
25.0000 mg | ORAL_TABLET | Freq: Three times a day (TID) | ORAL | 3 refills | Status: DC
  Filled 2023-01-24: qty 270, 90d supply, fill #0

## 2023-01-24 MED ORDER — HYDRALAZINE HCL 10 MG PO TABS: 10.0000 mg | ORAL_TABLET | Freq: Three times a day (TID) | ORAL | 1 refills | Status: DC

## 2023-01-24 NOTE — Telephone Encounter (Signed)
Pt called to report that this past Saturday he felt well and worked outside with his horses until 11 pm... Sunday he was doing well asll day and went to see his horses again and made 2 stops at stores and at the second store he was walking around for several minutes and he became presyncopal... he was wobbly and dizzy and saw himself in the mirror and he appeared drunken. He said he ate and drink well the day before and that day.   He did not have a headache, no palpitations.. his BP has been up.... 143/99, 160/97, 170/102 but came down a few min later... HR has been staying 65-75.   He was not reaching down to pick up items so no positional changes.   He will continue to monitor... he will keep a log of his BP and take it after he has been sitting for about 10-15 min with both feet on the ground.   He will let us know if this happens again prior to hearing from [place]... he has follow up with Dr Tenny Craw 02/02/23.

## 2023-01-24 NOTE — Patient Instructions (Signed)
Medication Instructions:  Start Hydralazine 25 mg three times a day *If you need a refill on your cardiac medications before your next appointment, please call your pharmacy*   Lab Work: Bmet done today  If you have labs (blood work) drawn today and your tests are completely normal, you will receive your results only by: MyChart Message (if you have MyChart) OR A paper copy in the mail If you have any lab test that is abnormal or we need to change your treatment, we will call you to review the results.   Testing/Procedures:    Follow-Up: At [facility], you and your health needs are our priority.  As part of our continuing mission to provide you with exceptional heart care, we have created designated Provider Care Teams.  These Care Teams include your primary Cardiologist (physician) and Advanced Practice Providers (APPs -  Physician Assistants and Nurse Practitioners) who all work together to provide you with the care you need, when you need it.  We recommend signing up for the patient portal called "MyChart".  Sign up information is provided on this After Visit Summary.  MyChart is used to connect with patients for Virtual Visits (Telemedicine).  Patients are able to view lab/test results, encounter notes, upcoming appointments, etc.  Non-urgent messages can be sent to your provider as well.   To learn more about what you can do with MyChart, go to [email or web address].    Your next appointment:   With Dr Tenny Craw 02/02/23

## 2023-01-24 NOTE — Telephone Encounter (Signed)
STAT if patient feels like he/she is going to faint   Are you dizzy now?  Yes, patient states yesterday he became very dizzy while in a store and has been dizzy ever since.  Do you feel faint or have you passed out?  No, but patient says he felt like he was close to passing out yesterday.  Do you have any other symptoms?  No   Have you checked your HR and BP (record if available)?  7/21: 161/98 60, 160/101 64 7/22: 160/97 71

## 2023-01-24 NOTE — Telephone Encounter (Signed)
I called the pt and he will come in today for a nurse visit with me.

## 2023-01-24 NOTE — Telephone Encounter (Signed)
Have him come in for EKG and BP check He has some renal insufficiency     Have him bring meds  Could try low dose hydralazine 10 mg tid     Would get BMET today  I am in [facility] clinic

## 2023-01-24 NOTE — Progress Notes (Signed)
Pt came in for EKG and BP check per Dr Tenny Craw for dizzy spell and increased BP.  Pt denies headache, palpitations, SOB... feels well today.   ECG NSR with normal QT... rate 73  BP high today and Dr Tenny Craw adding Hydralazine 25 mg TID.[name]  Pt to monitor and let us know if any changes.   We went over sx of CVA and to call EMS asap if he develops any symptoms that would suggest he could be having an event such as this.   Pt will keep his OV with Dr Tenny Craw 02/02/23 but will let us know if he needs [place] sooner.

## 2023-01-25 LAB — BASIC METABOLIC PANEL
BUN/Creatinine Ratio: 13 (ref 10–24)
BUN: 20 mg/dL (ref 8–27)
CO2: 20 mmol/L (ref 20–29)
Calcium: 9.2 mg/dL (ref 8.6–10.2)
Chloride: 107 mmol/L — ABNORMAL HIGH (ref 96–106)
Creatinine, Ser: 1.5 mg/dL — ABNORMAL HIGH (ref 0.76–1.27)
Glucose: 95 mg/dL (ref 70–99)
Potassium: 3.5 mmol/L (ref 3.5–5.2)
Sodium: 144 mmol/L (ref 134–144)
eGFR: 49 mL/min/{1.73_m2} — ABNORMAL LOW (ref >59)

## 2023-01-31 NOTE — Progress Notes (Unsigned)
Cardiology Office Note   Date:  02/02/2023   ID:  Joseph Tran, DOB 04/24/1951, MRN [id number]  PCP:  Nelwyn Salisbury, MD  Cardiologist:   Dietrich Pates, MD   Patient  here for follow up of atrial fibrillation    History of Present Illness: Joseph Tran is a 72 y.o. male with a history of HTN, thromboembolic dz,  CKD  (stage III) and  PAF  he is followed by Joseph Tran in April  2024  Started on xarelto    Seen by Joseph Tran again in early May   Clnic note says he was in SR No EKG   I saw the pt for the first time on Dec 03, 2022.   He was exhausted, felt heart racing, dizzy at times.  Some days better than others Metoprolol was added (25 bid)   The pt had monitor placed that showed he was in Afib 100% time    Flecanide started, initially 50 bid   The pt says with these meds he has felt very tired, light headed at times    He underwent DCCCV on 01/05/23,  said he felt a little "time warp" after the procedure.  HE said he did not have this sensation  when he was given propofol in past  The pt called in on 01/17/23  Was having diarrhea  Questioned if due to meds   Metoprolol was stopped   Cardiazem added  240 Mg  Today the pt says that since cardioversion he has noticed that he is less SOB with activity.  He can walk up a flight of stairs and not get  fatigued   He still notes generalized fatigue Will sleep 11 or 12 hours a night   Is using CPAP regularly  He denies CP  No palpitations/racing   He is not dizzy   Bowels have improved      Current Meds  Medication Sig   acetaminophen (TYLENOL) 500 MG tablet Take 1,000 mg by mouth every 8 (eight) hours as needed for moderate pain or mild pain.   albuterol (PROAIR HFA) 108 (90 Base) MCG/ACT inhaler INHALE 2 PUFFS EVERY 4  HOURS AS NEEDED FOR  WHEEZING OR SHORTNESS OF  BREATH   Apple Cider Vinegar 600 MG CAPS Take 600 mg by mouth at bedtime.   b complex vitamins capsule Take 1 capsule by mouth at bedtime. With vit B12   bisacodyl (DULCOLAX) 5 MG EC tablet  Take 5 mg by mouth at bedtime.   buPROPion (WELLBUTRIN XL) 300 MG 24 hr tablet TAKE 1 TABLET BY MOUTH DAILY   Cholecalciferol 50 MCG (2000 UT) TABS Take 2,000 Units by mouth at bedtime.   clindamycin (CLEOCIN) 300 MG capsule Take 600 mg by mouth once.   clonazePAM (KLONOPIN) 1 MG tablet Take 1 tablet (1 mg total) by mouth 2 (two) times daily as needed for anxiety. for anxiety (Patient taking differently: Take 1 mg by mouth at bedtime as needed for anxiety.)   desvenlafaxine (PRISTIQ) 50 MG 24 hr tablet TAKE 1 TABLET BY MOUTH DAILY (Patient taking differently: Take 50 mg by mouth at bedtime.)   diltiazem (CARDIZEM CD) 240 MG 24 hr capsule Take 1 capsule (240 mg total) by mouth daily.   docusate sodium (COLACE) 100 MG capsule Take 1 capsule (100 mg total) by mouth 2 (two) times daily.   furosemide (LASIX) 20 MG tablet TAKE 1 TABLET BY MOUTH  DAILY AS NEEDED   gabapentin (NEURONTIN) 300  MG capsule TAKE 1 CAPSULE(300 MG) BY MOUTH AT BEDTIME   hydrALAZINE (APRESOLINE) 25 MG tablet Take 1 tablet (25 mg total) by mouth 3 (three) times daily.   Hypromellose (GENTEAL MILD OP) Place 1 drop into both eyes daily as needed (dry eyes).   prazosin (MINIPRESS) 2 MG capsule Take 1 capsule (2 mg total) by mouth at bedtime.   senna (SENOKOT) 8.6 MG TABS tablet Take 1 tablet by mouth at bedtime. Vegetable   tamsulosin (FLOMAX) 0.4 MG CAPS capsule Take 1 capsule (0.4 mg total) by mouth daily.   XARELTO 20 MG TABS tablet TAKE 1 TABLET(20 MG) BY MOUTH DAILY WITH SUPPER   [DISCONTINUED] flecainide (TAMBOCOR) 50 MG tablet Take 1.5 tablets (75 mg total) by mouth 2 (two) times daily.     Allergies:   Allopurinol, Penicillins, Sulfamethoxazole, Sulfonamide derivatives, and Dilaudid [hydromorphone hcl]   Past Medical History:  Diagnosis Date   Acoustic neuroma (HCC)    benign - left   Anxiety    Arthritis    Asthma    seasonal, when pollen is high, cold air closes me up   Cancer [facility])    skin cancer -- arm, scalp,  upper back   Chronic kidney disease    sees Dr. Jaye Beagle at [facility], left kidney is non=functioning.  right kidny is at 50%   Clotting disorder (HCC)    Hx DVT - Xarelto   Colon polyps    Complication of anesthesia    Post op nausea/vomiting   Depression    severe.  dx 1985   DVT (deep venous thrombosis) (HCC)    sees Dr. Arlan Organ    History of kidney stones    has kidney stone now.     Hypertension    Hypogonadism male    sees Dr. Hadley Pen at [facility]   Neuropathy of both feet    OSA (obstructive sleep apnea)    Plantar fasciitis    rt foot   Pneumonia    PONV (postoperative nausea and vomiting)    Renal atrophy, left    sees Dr. Hadley Pen at [facility]   Sleep apnea    tested 2010  - wears c-pap    Past Surgical History:  Procedure Laterality Date   CARDIOVERSION N/A 01/05/2023   Procedure: CARDIOVERSION;  Surgeon: Jodelle Red, MD;  Location: [facility] INVASIVE CV LAB;  Service: Cardiovascular;  Laterality: N/A;   CHEST WALL TUMOR EXCISION  2007   COLONOSCOPY  04/07/2020   per Dr. Rhea Belton, adenomatous polyps, repeat in 3 yrs    kidney caluculs  2004-05   KIDNEY STONE SURGERY     x 2c   KNEE ARTHROSCOPY  09/10/2011   right knee, per Dr. Jodi Geralds    KNEE ARTHROSCOPY Right 04/25/2017   Procedure: RIGHT KNEE ARTHROSCOPY, PARTIAL LATERAL MENISECTOMY, CHONDROPLASTY MEDIAL AND LATERAL PATELLAOFEMORAL;  Surgeon: Jodi Geralds, MD;  Location: [facility] OR;  Service: Orthopedics;  Laterality: Right;   KNEE ARTHROSCOPY Left 09/18/2021   Procedure: ARTHROSCOPY KNEE;  Surgeon: Jodi Geralds, MD;  Location: [facility] ORS;  Service: Orthopedics;  Laterality: Left;   KNEE ARTHROSCOPY WITH MEDIAL MENISECTOMY Left 09/18/2021   Procedure: KNEE ARTHROSCOPY WITH MEDIAL MENISECTOMY;  Surgeon: Jodi Geralds, MD;  Location: [facility] ORS;  Service: Orthopedics;  Laterality: Left;   madiscus cartilage  2009   MOHS SURGERY     right knee arthroscopy  2008    TONSILLECTOMY     TOTAL KNEE ARTHROPLASTY Right 01/19/2019  Procedure: RIGHT TOTAL KNEE ARTHROPLASTY;  Surgeon: Jodi Geralds, MD;  Location: [facility];  Service: Orthopedics;  Laterality: Right;   TOTAL KNEE ARTHROPLASTY Left 06/14/2022   Procedure: TOTAL KNEE ARTHROPLASTY;  Surgeon: Jodi Geralds, MD;  Location: [facility];  Service: Orthopedics;  Laterality: Left;   UPPER GASTROINTESTINAL ENDOSCOPY       Social History:  The patient  reports that he has never smoked. He has never used smokeless tobacco. He reports that he does not drink alcohol and does not use drugs.   Family History:  The patient's family history includes Heart disease in his father and another family member; Hyperlipidemia in an other family member; Liver cancer in his paternal grandmother; Stroke in an other family member; Sudden death in an other family member; Throat cancer in his maternal grandmother.    ROS:  Please see the history of present illness. All other systems are reviewed and  Negative to the above problem except as noted.    PHYSICAL EXAM: VS:  BP 126/82   Pulse 74   Ht 6' (1.829 m)   Wt 256 lb 6.4 oz (116.3 kg)   SpO2 96%   BMI 34.77 kg/m   GEN: Obese 72 yo  in no acute distress  HEENT: normal  Neck: no JVD, no carotid bruit Cardiac: RRR  No S3   No murmur   No LE  edema  Respiratory:  CTA  GI: soft, nontender  No hepatomegaly  MS: no deformity Moving all extremities      EKG:  EKG is ordered today. NSR  74 bpm     Monitor June 2024   Rhythm:   Atrial fibrillation   Rates 44 to 158 bpm   Average HR 96 bpm On short burst of ventricular tachycardia  (5 beats at 156 bpm).    Echo   December 23 2022 1. Left ventricular ejection fraction, by estimation, is 40 to 45%. The  left ventricle has mildly decreased function. The left ventricle has no  regional wall motion abnormalities. Left ventricular diastolic parameters  are indeterminate.   2. Right ventricular systolic function is normal. The right  ventricular  size is normal.   3. Left atrial size was moderately dilated.   4. The mitral valve is normal in structure. Moderate mitral valve  regurgitation. No evidence of mitral stenosis.   5. The aortic valve is normal in structure. Aortic valve regurgitation is  not visualized. No aortic stenosis is present.   6. The inferior vena cava is normal in size with greater than 50%  respiratory variability, suggesting right atrial pressure of 3 mmHg.   Lipid Panel    Component Value Date/Time   CHOL 200 07/21/2021 0841   TRIG 129.0 07/21/2021 0841   HDL 47.80 07/21/2021 0841   CHOLHDL 4 07/21/2021 0841   VLDL 25.8 07/21/2021 0841   LDLCALC 126 (H) 07/21/2021 0841   LDLCALC 105 (H) 06/04/2020 0950      Wt Readings from Last 3 Encounters:  02/02/23 256 lb 6.4 oz (116.3 kg)  01/24/23 260 lb (117.9 kg)  01/05/23 261 lb 12.7 oz (118.8 kg)        ASSESSMENT AND PLAN:  1  Atrial fibrillation  Pt remains in SR .  DCCV on 7/3  Remains  on flecanide     I am not convinced diarrhea due to metoprolol but he is on dilt now Follow  No changes          2  Hx HFrEF   Echo in May shows LVEF 40s%  Volume status OK today    ? Tachyrelated  Would recomm repeat echo this fall He is on hydralazine  and diltiazem  With LV dysfunction would favor b blocker (he was switched however due to possible SE; Needs to be on something nodal acting with flecanide)  3  Hx of HTN   BP is 140s/90s at home   WIll get labs  Need to advance meds   Arrange for follow up for response   3 CKD  Check BMET today   4   Hx of thromboembolic dz leg  Continue Xarelto  Followed in heme clinic ([facility])   Current medicines are reviewed at length with the patient today.  The patient does not have concerns regarding medicines.  Signed, Dietrich Pates, MD  02/02/2023 10:05 AM    [facility] [address] [place], [place], [place]  [address] Phone: [phone]; Fax: [phone]

## 2023-02-02 ENCOUNTER — Other Ambulatory Visit: Payer: Self-pay | Admitting: Family Medicine

## 2023-02-02 ENCOUNTER — Ambulatory Visit: Payer: Medicare Other | Attending: Internal Medicine | Admitting: Internal Medicine

## 2023-02-02 ENCOUNTER — Encounter (INDEPENDENT_AMBULATORY_CARE_PROVIDER_SITE_OTHER): Payer: Self-pay

## 2023-02-02 ENCOUNTER — Encounter: Payer: Self-pay | Admitting: Internal Medicine

## 2023-02-02 VITALS — BP 126/82 | HR 74 | Ht 72.0 in | Wt 256.4 lb

## 2023-02-02 DIAGNOSIS — R739 Hyperglycemia, unspecified: Secondary | ICD-10-CM | POA: Diagnosis not present

## 2023-02-02 DIAGNOSIS — I480 Paroxysmal atrial fibrillation: Secondary | ICD-10-CM | POA: Diagnosis not present

## 2023-02-02 DIAGNOSIS — I10 Essential (primary) hypertension: Secondary | ICD-10-CM

## 2023-02-02 DIAGNOSIS — E785 Hyperlipidemia, unspecified: Secondary | ICD-10-CM | POA: Diagnosis not present

## 2023-02-02 MED ORDER — FLECAINIDE ACETATE 50 MG PO TABS: 75.0000 mg | ORAL_TABLET | Freq: Two times a day (BID) | ORAL | 3 refills | Status: DC

## 2023-02-02 NOTE — Patient Instructions (Signed)
Medication Instructions:   *If you need a refill on your cardiac medications before your next appointment, please call your pharmacy*   Lab Work: Bmet, hgba1c, nmr today  If you have labs (blood work) drawn today and your tests are completely normal, you will receive your results only by: MyChart Message (if you have MyChart) OR A paper copy in the mail If you have any lab test that is abnormal or we need to change your treatment, we will call you to review the results.   Testing/Procedures:    Follow-Up: At [facility], you and your health needs are our priority.  As part of our continuing mission to provide you with exceptional heart care, we have created designated Provider Care Teams.  These Care Teams include your primary Cardiologist (physician) and Advanced Practice Providers (APPs -  Physician Assistants and Nurse Practitioners) who all work together to provide you with the care you need, when you need it.  We recommend signing up for the patient portal called "MyChart".  Sign up information is provided on this After Visit Summary.  MyChart is used to connect with patients for Virtual Visits (Telemedicine).  Patients are able to view lab/test results, encounter notes, upcoming appointments, etc.  Non-urgent messages can be sent to your provider as well.   To learn more about what you can do with MyChart, go to [email or web address].    Your next appointment:   2 month(s) end of September   Provider:   Dietrich Pates, MD     Other Instructions

## 2023-02-08 ENCOUNTER — Other Ambulatory Visit (HOSPITAL_BASED_OUTPATIENT_CLINIC_OR_DEPARTMENT_OTHER): Payer: Self-pay

## 2023-02-08 ENCOUNTER — Telehealth: Payer: Self-pay

## 2023-02-08 DIAGNOSIS — E785 Hyperlipidemia, unspecified: Secondary | ICD-10-CM

## 2023-02-08 MED ORDER — ROSUVASTATIN CALCIUM 5 MG PO TABS: 5.0000 mg | ORAL_TABLET | Freq: Every day | ORAL | 3 refills | Status: DC

## 2023-02-08 NOTE — Telephone Encounter (Signed)
The patient has been notified of the result and verbalized understanding.  All questions (if any) were answered. Frutoso Schatz, RN 02/08/2023 8:38 AM  Crestor has been sent in. Labs have been scheduled.

## 2023-02-08 NOTE — Telephone Encounter (Signed)
-----   Message from [place] sent at 02/06/2023 10:03 PM EDT ----- Electrolytes are OK Cr 1.48   Better than previous Hgb A1C is very good    5.2 LDL 125  Given other heart issues I would recomm Crestor 5 mg   Follow up lipomed in 8 wks with liver panel

## 2023-03-03 ENCOUNTER — Telehealth (INDEPENDENT_AMBULATORY_CARE_PROVIDER_SITE_OTHER): Payer: Medicare Other | Admitting: Family Medicine

## 2023-03-03 ENCOUNTER — Encounter: Payer: Self-pay | Admitting: Family Medicine

## 2023-03-03 VITALS — BP 151/86 | HR 87 | Wt 250.0 lb

## 2023-03-03 DIAGNOSIS — Z0000 Encounter for general adult medical examination without abnormal findings: Secondary | ICD-10-CM

## 2023-03-03 NOTE — Progress Notes (Signed)
PATIENT CHECK-IN and HEALTH RISK ASSESSMENT QUESTIONNAIRE:  -completed by phone/video for upcoming Medicare Preventive Visit  Pre-Visit Check-in: 1)Vitals (height, wt, BP, etc) - record in vitals section for visit on day of visit Request home vitals (wt, BP, etc.) and enter into vitals, THEN update Vital Signs SmartPhrase below at the top of the HPI. See below.  2)Review and Update Medications, Allergies PMH, Surgeries, Social history in Epic 3)Hospitalizations in the last year with date/reason? Yes-July 2024 due to cardioversion   4)Review and Update Care Team (patient's specialists) in Epic 5) Complete PHQ9 in Epic  6) Complete Fall Screening in Epic 7)Review all Health Maintenance Due and order under PCP if not done.  Medicare Wellness Patient Questionnaire:  Answer theses question about your habits: Do you drink alcohol? no If yes, how many drinks do you have a day?N/A Have you ever smoked?no Quit date if applicable? N/A  How many packs a day do/did you smoke? N/A Do you use smokeless tobacco?no Do you use an illicit drugs?no Do you exercises? Yes IF so, what type and how many days/minutes per week? 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 No Number of partners?0 Typical breakfast-none-generally sleeps through breakfast, may have oatmeal Typical lunch-veggie burger, mixed veggies Typical dinner-may not eat dinner-maybe chicken Typical snacks: no snacks  Beverages: water  Answer theses question about you: Can you perform most household chores?yes Do you find it hard to follow a conversation in a noisy room?yes-deaf in left ear Do you often ask people to speak up or repeat themselves?yes Do you feel that you have a problem with memory?yes - chronic, stable Do you balance your checkbook and or bank acounts?yes Do you feel safe at home?yes Last dentist visit?2 months Do you need assistance with any of the following: Please note if so    Driving? no  Feeding yourself? no  Getting from bed to chair? no  Getting to the toilet? no  Bathing or showering? no  Dressing yourself? no  Managing money? no  Climbing a flight of stairs-needs railing for assistance  Preparing meals? no    Do you have Advanced Directives in place (Living Will, Healthcare Power or Attorney)? yes   Last eye Exam and location?January 2024, will be seeing his doctor soon.    Do you currently use prescribed or non-prescribed narcotic or opioid pain medications?no  Do you have a history or close family history of breast, ovarian, tubal or peritoneal cancer or a family member with BRCA (breast cancer susceptibility 1 and 2) gene mutations? Materal aunt  and maternal cousin-ovarian cancer  Request home vitals (wt, BP, etc.) and enter into vitals, THEN update Vital Signs SmartPhrase below at the top of the HPI. See below.   Nurse/Assistant Credentials/time stamp: Mellody Drown   ----------------------------------------------------------------------------------------------------------------------------------------------------------------------------------------------------------------------  Vital Signs: Vital signs are patient reported.   MEDICARE ANNUAL PREVENTIVE CARE VISIT WITH PROVIDER (Welcome to Medicare, initial annual wellness or annual wellness exam)  Virtual Visit via Video Note  I connected with DENELL HAGUE on 03/03/23  by a video enabled telemedicine application and verified that I am speaking with the correct person using two identifiers.  Location patient: home Location provider:work or home office Persons participating in the virtual visit: patient, provider  Concerns and/or follow up today: Doing ok. BP has been running a little high for awhile - on several medications fpr BP - seeing cardiologist for this and they are working on this.  Recently started a 3rd medication. He has had some intolerance to a number of  medication. Has been having outpt PT. Reports the cardiologist told him ok to exercise. He does 3-4 hours of walking in the heat daily for work. Wiped out after.   See HM section in Epic for other details of completed HM.    ROS: negative for report of fevers, unintentional weight loss, vision changes, vision loss, hearing loss or change, chest pain, sob, hemoptysis, melena, hematochezia, hematuria, falls, bleeding or bruising, thoughts of suicide or self harm, memory loss  Patient-completed extensive health risk assessment - reviewed and discussed with the patient: See Health Risk Assessment completed with patient prior to the visit either above or in recent phone note. This was reviewed in detailed with the patient today and appropriate recommendations, orders and referrals were placed as needed per Summary below and patient instructions.   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  right kidny is at 50%   Clotting disorder (HCC)    Hx DVT - Xarelto   Colon polyps    Complication of anesthesia    Post op nausea/vomiting   Depression    severe.  dx 1985   DVT (deep venous thrombosis) (HCC)    sees Dr. Arlan Organ    History of kidney stones    has kidney stone now.     Hypertension    Hypogonadism male    sees Dr. Hadley Pen at  [facility]   Neuropathy of both feet    OSA (obstructive sleep apnea)    Plantar fasciitis    rt foot   Pneumonia    PONV (postoperative nausea and vomiting)    Renal atrophy, left    sees Dr. Hadley Pen at [facility]   Sleep apnea    tested 2010  - wears c-pap    Past Surgical History:  Procedure Laterality Date   CARDIOVERSION N/A 01/05/2023   Procedure: CARDIOVERSION;  Surgeon: Jodelle Red, MD;  Location: [facility] INVASIVE CV LAB;  Service: Cardiovascular;  Laterality: N/A;   CHEST WALL TUMOR EXCISION  2007   COLONOSCOPY  04/07/2020   per Dr. Rhea Belton, adenomatous polyps, repeat in 3 yrs    kidney caluculs  2004-05   KIDNEY STONE SURGERY     x 2c   KNEE ARTHROSCOPY  09/10/2011   right knee, per Dr. Jodi Geralds    KNEE ARTHROSCOPY Right 04/25/2017   Procedure: RIGHT KNEE ARTHROSCOPY, PARTIAL LATERAL MENISECTOMY, CHONDROPLASTY MEDIAL AND LATERAL PATELLAOFEMORAL;  Surgeon: Jodi Geralds, MD;  Location: [facility] OR;  Service: Orthopedics;  Laterality: Right;   KNEE ARTHROSCOPY Left 09/18/2021   Procedure: ARTHROSCOPY KNEE;  Surgeon: Jodi Geralds, MD;  Location: [facility] ORS;  Service: Orthopedics;  Laterality: Left;  KNEE ARTHROSCOPY WITH MEDIAL MENISECTOMY Left 09/18/2021   Procedure: KNEE ARTHROSCOPY WITH MEDIAL MENISECTOMY;  Surgeon: Jodi Geralds, MD;  Location: [facility] ORS;  Service: Orthopedics;  Laterality: Left;   madiscus cartilage  2009   MOHS SURGERY     right knee arthroscopy  2008   TONSILLECTOMY     TOTAL KNEE ARTHROPLASTY Right 01/19/2019   Procedure: RIGHT TOTAL KNEE ARTHROPLASTY;  Surgeon: Jodi Geralds, MD;  Location: [facility] ORS;  Service: Orthopedics;  Laterality: Right;   TOTAL KNEE ARTHROPLASTY Left 06/14/2022   Procedure: TOTAL KNEE ARTHROPLASTY;  Surgeon: Jodi Geralds, MD;  Location: [facility] ORS;  Service: Orthopedics;  Laterality: Left;   UPPER GASTROINTESTINAL ENDOSCOPY      Social History   Socioeconomic History   Marital status: Single    Spouse name: Not on  file   Number of children: 0   Years of education: Not on file   Highest education level: Bachelor's degree (e.g., BA, AB, BS)  Occupational History   Occupation: retired  Tobacco Use   Smoking status: Never   Smokeless tobacco: Never   Tobacco comments:    NEVER USED TOBACCO  Vaping Use   Vaping status: Never Used  Substance and Sexual Activity   Alcohol use: Never   Drug use: Never   Sexual activity: Not Currently  Other Topics Concern   Not on file  Social History Narrative   Not on file   Social Determinants of Health   Financial Resource Strain: Low Risk  (03/03/2023)   Overall Financial Resource Strain (CARDIA)    Difficulty of Paying Living Expenses: Not hard at all  Food Insecurity: No Food Insecurity (03/03/2023)   Hunger Vital Sign    Worried About Running Out of Food in the Last Year: Never true    Ran Out of Food in the Last Year: Never true  Transportation Needs: Unmet Transportation Needs (03/03/2023)   PRAPARE - Administrator, Civil Service (Medical): Yes    Lack of Transportation (Non-Medical): No  Physical Activity: Sufficiently Active (03/03/2023)   Exercise Vital Sign    Days of Exercise per Week: 6 days    Minutes of Exercise per Session: 40 min  Stress: No Stress Concern Present (03/03/2023)   [employer] of Occupational Health - Occupational Stress Questionnaire    Feeling of Stress : Not at all  Social Connections: Unknown (03/03/2023)   Social Connection and Isolation Panel [NHANES]    Frequency of Communication with Friends and Family: Twice a week    Frequency of Social Gatherings with Friends and Family: Patient declined    Attends Religious Services: Never    Database administrator or Organizations: No    Attends Banker Meetings: Never    Marital Status: Never married  Intimate Partner Violence: Not At Risk (03/03/2023)   Humiliation, Afraid, Rape, and Kick questionnaire    Fear of Current or Ex-Partner: No     Emotionally Abused: No    Physically Abused: No    Sexually Abused: No    Family History  Problem Relation Age of Onset   Heart disease Other        parents   Hyperlipidemia Other    Stroke Other        grandparents   Sudden death Other        uncle less than 67 yrs old   Heart disease Father    Throat cancer Maternal Grandmother    Liver cancer Paternal Grandmother  Colon cancer Neg Hx    Esophageal cancer Neg Hx    Rectal cancer Neg Hx    Stomach cancer Neg Hx     Current Outpatient Medications on File Prior to Visit  Medication Sig Dispense Refill   acetaminophen (TYLENOL) 500 MG tablet Take 1,000 mg by mouth every 8 (eight) hours as needed for moderate pain or mild pain.     albuterol (PROAIR HFA) 108 (90 Base) MCG/ACT inhaler INHALE 2 PUFFS EVERY 4  HOURS AS NEEDED FOR  WHEEZING OR SHORTNESS OF  BREATH 51 g 3   Apple Cider Vinegar 600 MG CAPS Take 600 mg by mouth at bedtime.     b complex vitamins capsule Take 1 capsule by mouth at bedtime. With vit B12     bisacodyl (DULCOLAX) 5 MG EC tablet Take 5 mg by mouth at bedtime.     buPROPion (WELLBUTRIN XL) 300 MG 24 hr tablet TAKE 1 TABLET BY MOUTH DAILY 90 tablet 3   Cholecalciferol 50 MCG (2000 UT) TABS Take 2,000 Units by mouth at bedtime.     clindamycin (CLEOCIN) 300 MG capsule Take 600 mg by mouth once.     clonazePAM (KLONOPIN) 1 MG tablet Take 1 tablet (1 mg total) by mouth 2 (two) times daily as needed for anxiety. for anxiety (Patient taking differently: Take 1 mg by mouth at bedtime as needed for anxiety.) 180 tablet 1   desvenlafaxine (PRISTIQ) 50 MG 24 hr tablet TAKE 1 TABLET BY MOUTH DAILY (Patient taking differently: Take 50 mg by mouth at bedtime.) 90 tablet 3   diltiazem (CARDIZEM CD) 240 MG 24 hr capsule Take 1 capsule (240 mg total) by mouth daily. 90 capsule 3   docusate sodium (COLACE) 100 MG capsule Take 1 capsule (100 mg total) by mouth 2 (two) times daily. 30 capsule 0   flecainide (TAMBOCOR) 50 MG  tablet Take 1.5 tablets (75 mg total) by mouth 2 (two) times daily. 270 tablet 3   furosemide (LASIX) 20 MG tablet TAKE 1 TABLET BY MOUTH  DAILY AS NEEDED 90 tablet 3   gabapentin (NEURONTIN) 300 MG capsule TAKE 1 CAPSULE(300 MG) BY MOUTH AT BEDTIME 30 capsule 5   hydrALAZINE (APRESOLINE) 25 MG tablet Take 1 tablet (25 mg total) by mouth 3 (three) times daily. 270 tablet 3   Hypromellose (GENTEAL MILD OP) Place 1 drop into both eyes daily as needed (dry eyes).     prazosin (MINIPRESS) 2 MG capsule Take 1 capsule (2 mg total) by mouth at bedtime. 1 capsule 0   rosuvastatin (CRESTOR) 5 MG tablet Take 1 tablet (5 mg total) by mouth daily. 90 tablet 3   senna (SENOKOT) 8.6 MG TABS tablet Take 1 tablet by mouth at bedtime. Vegetable     tamsulosin (FLOMAX) 0.4 MG CAPS capsule TAKE 1 CAPSULE BY MOUTH DAILY 90 capsule 1   XARELTO 20 MG TABS tablet TAKE 1 TABLET(20 MG) BY MOUTH DAILY WITH SUPPER 30 tablet 2   No current facility-administered medications on file prior to visit.    Allergies  Allergen Reactions   Allopurinol Other (See Comments)    PATIENT PREFERENCE Pt refused due to mom developing steven johnson syndrome.   Penicillins Hives    Has patient had a PCN reaction causing immediate rash, facial/tongue/throat swelling, SOB or lightheadedness with hypotension: Unknown Has patient had a PCN reaction causing severe rash involving mucus membranes or skin necrosis:Unknown Has patient had a PCN reaction that required hospitalization: No Has patient had  a PCN reaction occurring within the last 10 years: No If all of the above answers are "NO", then may proceed with Cephalosporin use.    Sulfamethoxazole Hives   Sulfonamide Derivatives Hives   Dilaudid [Hydromorphone Hcl] Nausea And Vomiting       Physical Exam Vitals requested from patient and listed below if patient had equipment and was able to obtain at home for this virtual visit: Vitals:   03/03/23 1440  BP: (!) 151/86  Pulse:  87   Estimated body mass index is 33.91 kg/m as calculated from the following:   Height as of 02/02/23: 6' (1.829 m).   Weight as of this encounter: 250 lb (113.4 kg).  EKG (optional): deferred due to virtual visit  GENERAL: alert, oriented, no acute distress detected; full vision exam deferred due to pandemic and/or virtual encounter  HEENT: atraumatic, conjunttiva clear, no obvious abnormalities on inspection of external nose and ears  NECK: normal movements of the head and neck  LUNGS: on inspection no signs of respiratory distress, breathing rate appears normal, no obvious gross SOB, gasping or wheezing  CV: no obvious cyanosis  MS: moves all visible extremities without noticeable abnormality  PSYCH/NEURO: pleasant and cooperative, no obvious depression or anxiety, speech and thought processing grossly intact, Cognitive function grossly intact  Flowsheet Row Video Visit from 03/03/2023 in [facility] [place]  PHQ-9 Total Score 13           03/03/2023    2:44 PM 11/09/2022    2:43 PM 10/13/2022    3:19 PM 05/12/2022    2:28 PM 03/26/2022   10:12 AM  Depression screen PHQ 2/9  Decreased Interest 2 1 1 1 1   Down, Depressed, Hopeless 2 1 1 1 1   PHQ - 2 Score 4 2 2 2 2   Altered sleeping 1 0 0 0 0  Tired, decreased energy 3 3 3 3 3   Change in appetite 2 2 1 1 1   Feeling bad or failure about yourself  1 1 1 1 2   Trouble concentrating 1 0 0 0 0  Moving slowly or fidgety/restless 0 0 0 0 0  Suicidal thoughts 1 0 0 1 1  PHQ-9 Score 13 8 7 8 9   Difficult doing work/chores  Not difficult at all Somewhat difficult Not difficult at all Not difficult at all  Chronic depression - reports has been on medications for over 30 years.  Fluctuates - but stable. Denies severe symptoms. Denies suicidal thoughts.      05/10/2022    5:04 PM 05/12/2022    2:25 PM 07/14/2022   10:38 AM 10/13/2022    3:18 PM 03/03/2023    2:43 PM  Fall Risk  Falls in the past year? 0 0   1 0  Was there an injury with Fall?  0  1 0  Fall Risk Category Calculator  0  2 0  Fall Risk Category (Retired)  Low     (RETIRED) Patient Fall Risk Level  Low fall risk High fall risk    Patient at Risk for Falls Due to  No Fall Risks  Impaired balance/gait;No Fall Risks Other (Comment)  Fall risk Follow up  Falls evaluation completed  Falls evaluation completed Falls evaluation completed     SUMMARY AND PLAN:  Encounter for Medicare annual wellness exam    Discussed applicable health maintenance/preventive health measures and advised and referred or ordered per patient preferences: -he plans to call to his GI office,  number provided, to check on scheduling colonosocpy -discussed other measures due and how to get each if wishes to do  Health Maintenance  Topic Date Due   Diabetic kidney evaluation - Urine ACR  Never done   Hepatitis C Screening  Never done   INFLUENZA VACCINE  02/03/2023   Colonoscopy  04/08/2023   COVID-19 Vaccine (7 - 2023-24 season) 03/19/2023 (Originally 06/08/2022)   Pneumonia Vaccine 4+ Years old (3 of 3 - PPSV23 or PCV20) 08/05/2023 (Originally 04/28/2021)   Diabetic kidney evaluation - eGFR measurement  02/02/2024   Medicare Annual Wellness (AWV)  03/02/2024   DTaP/Tdap/Td (3 - Td or Tdap) 08/01/2025   Zoster Vaccines- Shingrix  Completed   HPV VACCINES  Aged [employer] and counseling on the following was provided based on the above review of health and a plan/checklist for the patient, along with additional information discussed, was provided for the patient in the patient instructions :   -Advised and counseled on a healthy lifestyle - including the importance of a healthy diet, regular physical activity -Reviewed patient's current diet. Advised and counseled on a whole foods based healthy diet. Specifically discussed foods to help with BP and energy. He is seeing cardiologist for the HTN and monitors closely.  A summary of a healthy diet  was provided in the Patient Instructions.  -reviewed patient's current physical activity level and discussed exercise guidelines for adults. Discussed community resources and ideas for safe exercise at home to assist in meeting exercise guideline recommendations in a safe and healthy way. Discussed staying hydrated and watching for signs of heat exhaustion when in the heat, cool packs, etc.  -Advise yearly dental visits at minimum and regular eye exams   Follow up: see patient instructions   Patient Instructions  I really enjoyed getting to talk with you today! I am available on Tuesdays and Thursdays for virtual visits if you have any questions or concerns, or if I can be of any further assistance.   CHECKLIST FROM ANNUAL WELLNESS VISIT:  -Follow up (please call to schedule if not scheduled after visit):   -follow up with Dr. Clent Ridges or your cardiologist if blood pressure continues to run high   -yearly for annual wellness visit with primary care office  Here is a list of your preventive care/health maintenance measures: -if you wish to complete any please let us know -call the Gastroenterologist about the colonoscopy as we discussed   Health Maintenance  Topic Date Due   Diabetic kidney evaluation - Urine ACR  Never done   Hepatitis C Screening  Never done   INFLUENZA VACCINE  02/03/2023   Colonoscopy  04/08/2023   COVID-19 Vaccine (7 - 2023-24 season) 03/19/2023 (Originally 06/08/2022)   Pneumonia Vaccine 35+ Years old (3 of 3 - PPSV23 or PCV20) 08/05/2023 (Originally 04/28/2021)   Diabetic kidney evaluation - eGFR measurement  02/02/2024   Medicare Annual Wellness (AWV)  03/02/2024   DTaP/Tdap/Td (3 - Td or Tdap) 08/01/2025   Zoster Vaccines- Shingrix  Completed   HPV VACCINES  Aged Out    -See a dentist at least yearly  -Get your eyes checked and then per your eye specialist's recommendations  -Other issues addressed today:   -I have included below further information  regarding a healthy whole foods based diet, physical activity guidelines for adults, stress management and opportunities for social connections. I hope you find this information useful.   -----------------------------------------------------------------------------------------------------------------------------------------------------------------------------------------------------------------------------------------------------------  NUTRITION: -eat real food: lots of colorful  vegetables (half the plate) and fruits -5-7 servings of vegetables and fruits per day (fresh or steamed is best), exp. 2 servings of vegetables with lunch and dinner and 2 servings of fruit per day. Berries and greens such as kale and collards are great choices.  -consume on a regular basis: whole grains (make sure first ingredient on label contains the word "whole"), fresh fruits, fish, nuts, seeds, healthy oils (such as olive oil, avocado oil, grape seed oil) -may eat small amounts of dairy and lean meat on occasion, but avoid processed meats such as ham, bacon, lunch meat, etc. -drink water -try to avoid fast food and pre-packaged foods, processed meat -most experts advise limiting sodium to < 2300mg  per day, should limit further is any chronic conditions such as high blood pressure, heart disease, diabetes, etc. The American Heart Association advised that < 1500mg  is is ideal -try to avoid foods that contain any ingredients with names you do not recognize  -try to avoid sugar/sweets (except for the natural sugar that occurs in fresh fruit) -try to avoid sweet drinks -try to avoid white rice, white bread, pasta (unless whole grain), white or yellow potatoes  EXERCISE GUIDELINES FOR ADULTS: -if you wish to increase your physical activity, do so gradually and with the approval of your doctor -STOP and seek medical care immediately if you have any chest pain, chest discomfort or trouble breathing when starting or  increasing exercise  -move and stretch your body, legs, feet and arms when sitting for long periods -Physical activity guidelines for optimal health in adults: -least 150 minutes per week of aerobic exercise (can talk, but not sing) once approved by your doctor, 20-30 minutes of sustained activity or two 10 minute episodes of sustained activity every day.  -resistance training at least 2 days per week if approved by your doctor -balance exercises 3+ days per week:   Stand somewhere where you have something sturdy to hold onto if you lose balance.    1) lift up on toes, start with 5x per day and work up to 20x   2) stand and lift on leg straight out to the side so that foot is a few inches of the floor, start with 5x each side and work up to 20x each side   3) stand on one foot, start with 5 seconds each side and work up to 20 seconds on each side  If you need ideas or help with getting more active:  -Silver sneakers https://tools.silversneakers.com  -Walk with a Doc: http://www.duncan-williams.com/  -try to include resistance (weight lifting/strength building) and balance exercises twice per week: or the following link for ideas: http://castillo-powell.com/  BuyDucts.dk  STRESS MANAGEMENT: -can try meditating, or just sitting quietly with deep breathing while intentionally relaxing all parts of your body for 5 minutes daily -if you need further help with stress, anxiety or depression please follow up with your primary doctor or contact the wonderful folks at [facility]: [phone]  SOCIAL CONNECTIONS: -options in [place] if you wish to engage in more social and exercise related activities:  -Silver sneakers https://tools.silversneakers.com  -Walk with a Doc: http://www.duncan-williams.com/  -Check out the [facility] Active Adults 50+ section on the [place] [employer] (hiking  clubs, book clubs, cards and games, chess, exercise classes, aquatic classes and much more) - see the website for details: https://www.[place].gov/departments/parks-recreation/active-adults50  -YouTube has lots of exercise videos for different ages and abilities as well  -[facility] (a variety of indoor and outdoor  inperson activities for adults). [phone]. [address]  -Virtual Online Classes (a variety of topics): see seniorplanet.org or call 5612658807  -consider volunteering at a school, hospice center, church, senior center or elsewhere           Terressa Koyanagi, DO

## 2023-03-03 NOTE — Patient Instructions (Signed)
I really enjoyed getting to talk with you today! I am available on Tuesdays and Thursdays for virtual visits if you have any questions or concerns, or if I can be of any further assistance.   CHECKLIST FROM ANNUAL WELLNESS VISIT:  -Follow up (please call to schedule if not scheduled after visit):   -follow up with Dr. Clent Ridges or your cardiologist if blood pressure continues to run high   -yearly for annual wellness visit with primary care office  Here is a list of your preventive care/health maintenance measures: -if you wish to complete any please let us know -call the Gastroenterologist about the colonoscopy as we discussed   Health Maintenance  Topic Date Due   Diabetic kidney evaluation - Urine ACR  Never done   Hepatitis C Screening  Never done   INFLUENZA VACCINE  02/03/2023   Colonoscopy  04/08/2023   COVID-19 Vaccine (7 - 2023-24 season) 03/19/2023 (Originally 06/08/2022)   Pneumonia Vaccine 40+ Years old (3 of 3 - PPSV23 or PCV20) 08/05/2023 (Originally 04/28/2021)   Diabetic kidney evaluation - eGFR measurement  02/02/2024   Medicare Annual Wellness (AWV)  03/02/2024   DTaP/Tdap/Td (3 - Td or Tdap) 08/01/2025   Zoster Vaccines- Shingrix  Completed   HPV VACCINES  Aged Out    -See a dentist at least yearly  -Get your eyes checked and then per your eye specialist's recommendations  -Other issues addressed today:   -I have included below further information regarding a healthy whole foods based diet, physical activity guidelines for adults, stress management and opportunities for social connections. I hope you find this information useful.   -----------------------------------------------------------------------------------------------------------------------------------------------------------------------------------------------------------------------------------------------------------  NUTRITION: -eat real food: lots of colorful vegetables (half the plate) and  fruits -5-7 servings of vegetables and fruits per day (fresh or steamed is best), exp. 2 servings of vegetables with lunch and dinner and 2 servings of fruit per day. Berries and greens such as kale and collards are great choices.  -consume on a regular basis: whole grains (make sure first ingredient on label contains the word "whole"), fresh fruits, fish, nuts, seeds, healthy oils (such as olive oil, avocado oil, grape seed oil) -may eat small amounts of dairy and lean meat on occasion, but avoid processed meats such as ham, bacon, lunch meat, etc. -drink water -try to avoid fast food and pre-packaged foods, processed meat -most experts advise limiting sodium to < 2300mg  per day, should limit further is any chronic conditions such as high blood pressure, heart disease, diabetes, etc. The American Heart Association advised that < 1500mg  is is ideal -try to avoid foods that contain any ingredients with names you do not recognize  -try to avoid sugar/sweets (except for the natural sugar that occurs in fresh fruit) -try to avoid sweet drinks -try to avoid white rice, white bread, pasta (unless whole grain), white or yellow potatoes  EXERCISE GUIDELINES FOR ADULTS: -if you wish to increase your physical activity, do so gradually and with the approval of your doctor -STOP and seek medical care immediately if you have any chest pain, chest discomfort or trouble breathing when starting or increasing exercise  -move and stretch your body, legs, feet and arms when sitting for long periods -Physical activity guidelines for optimal health in adults: -least 150 minutes per week of aerobic exercise (can talk, but not sing) once approved by your doctor, 20-30 minutes of sustained activity or two 10 minute episodes of sustained activity every day.  -resistance training at least 2 days per  week if approved by your doctor -balance exercises 3+ days per week:   Stand somewhere where you have something sturdy to  hold onto if you lose balance.    1) lift up on toes, start with 5x per day and work up to 20x   2) stand and lift on leg straight out to the side so that foot is a few inches of the floor, start with 5x each side and work up to 20x each side   3) stand on one foot, start with 5 seconds each side and work up to 20 seconds on each side  If you need ideas or help with getting more active:  -Silver sneakers https://tools.silversneakers.com  -Walk with a Doc: http://www.duncan-williams.com/  -try to include resistance (weight lifting/strength building) and balance exercises twice per week: or the following link for ideas: http://castillo-powell.com/  BuyDucts.dk  STRESS MANAGEMENT: -can try meditating, or just sitting quietly with deep breathing while intentionally relaxing all parts of your body for 5 minutes daily -if you need further help with stress, anxiety or depression please follow up with your primary doctor or contact the wonderful folks at [facility]: [phone]  SOCIAL CONNECTIONS: -options in [place] if you wish to engage in more social and exercise related activities:  -Silver sneakers https://tools.silversneakers.com  -Walk with a Doc: http://www.duncan-williams.com/  -Check out the [facility] Active Adults 50+ section on the [place] [employer] (hiking clubs, book clubs, cards and games, chess, exercise classes, aquatic classes and much more) - see the website for details: https://www.[place].gov/departments/parks-recreation/active-adults50  -YouTube has lots of exercise videos for different ages and abilities as well  -[facility] (a variety of indoor and outdoor inperson activities for adults). [phone]. [address]  -Virtual Online Classes (a variety of topics): see seniorplanet.org or call [phone]  -consider volunteering  at a school, hospice center, church, senior center or elsewhere

## 2023-03-08 ENCOUNTER — Ambulatory Visit: Payer: Medicare Other | Attending: Internal Medicine | Admitting: Pharmacist

## 2023-03-08 VITALS — BP 132/80 | HR 68

## 2023-03-08 DIAGNOSIS — I10 Essential (primary) hypertension: Secondary | ICD-10-CM

## 2023-03-08 MED ORDER — HYDRALAZINE HCL 50 MG PO TABS: 50.0000 mg | ORAL_TABLET | Freq: Three times a day (TID) | ORAL | 3 refills | Status: DC

## 2023-03-08 NOTE — Progress Notes (Signed)
Patient ID: Joseph Tran                 DOB: [birth year]                      MRN: [id number]      HPI: Joseph Tran is a 72 y.o. male referred by Dr. Tenny Craw to HTN clinic. PMH is significant for HTN, thromboembolic dz, CKD (stage III) and PAF. Echo in May showed an EF of 40-45%. On dilitazem due to possible diarrhea with metoprolol. Seen by Dr. Tenny Craw 7/31. BP in office was 126/82 but reported home readings 140/80.   Patient presents today to hypertension clinic.  Reports that post cardioversion his breathing has improved but he still struggles with fatigue.  He also feels lightheaded and dizzy.  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  He only has 1 functioning kidney.  Follows with a nephrologist.  Heart rate at home around 65-70 per his report.  Has an Omron blood pressure cuff, using his right arm however.  Reports blood pressure in the morning 145/90 and in the evening 123/80.  Sometimes his morning readings are before medicine and sometimes after.  Takes his hydralazine at 8 AM, 4 PM and midnight.  Struggles with constipation and takes several stool softeners/laxatives.  We did discuss if his EF decreases more that we will need to change him off of diltiazem back to a beta-blocker.  Patient contributed diarrhea to metoprolol.  I have a feeling it could have been a combination of that and all the medications for constipation he takes.  He does not drink any caffeine.  Asking about caffeine pills which I discouraged.  He works on a farm training show horses, states once he gets going he feels better but not good in reference to his fatigue.  His nephrologist resumed his prazosin due to his elevated blood pressure.  However he is also on tamsulosin which is duplicative therapy.  Current HTN meds: diltiazem 240mg  daily, furosemide 20 daily as needed, hydralazine 25mg  three times a day, prazosin 2mg  daily Previously tried: metoprolol (diarrhea) BP goal: <130/80  Family History:   The patient's family history includes Heart disease in his father and another family member; Hyperlipidemia in an other family member; Liver cancer in his paternal grandmother; Stroke in an other family member; Sudden death in an other family member; Throat cancer in his maternal grandmother.   Social History: no ETOH no tobacco  Diet: no caffeine  Exercise:  Trains show horses  Home BP readings:  145/90 AM 123/80 PM  Wt Readings from Last 3 Encounters:  03/03/23 250 lb (113.4 kg)  02/02/23 256 lb 6.4 oz (116.3 kg)  01/24/23 260 lb (117.9 kg)   BP Readings from Last 3 Encounters:  03/08/23 132/80  03/03/23 (!) 151/86  02/02/23 126/82   Pulse Readings from Last 3 Encounters:  03/08/23 68  03/03/23 87  02/02/23 74    Renal function: CrCl cannot be calculated (Patient's most recent lab result is older than the maximum 21 days allowed.).  Past Medical History:  Diagnosis Date   Acoustic neuroma (HCC)    benign - left   Anxiety    Arthritis    Asthma    seasonal, when pollen is high, cold air closes me up   Cancer [facility])    skin cancer -- arm, scalp, upper back   Chronic kidney disease    sees Dr. Jaye Beagle at [facility], left kidney is non=functioning.  right kidny is at 50%   Clotting disorder (HCC)    Hx DVT - Xarelto   Colon polyps    Complication of anesthesia    Post op nausea/vomiting   Depression    severe.  dx 1985   DVT (deep venous thrombosis) (HCC)    sees Dr. Arlan Organ    History of kidney stones    has kidney stone now.     Hypertension    Hypogonadism male    sees Dr. Hadley Pen at [facility]   Neuropathy of both feet    OSA (obstructive sleep apnea)    Plantar fasciitis    rt foot   Pneumonia    PONV (postoperative nausea and vomiting)    Renal atrophy, left    sees Dr. Hadley Pen at [facility]   Sleep apnea    tested 2010  - wears c-pap    Current Outpatient Medications on File Prior to Visit  Medication  Sig Dispense Refill   acetaminophen (TYLENOL) 500 MG tablet Take 1,000 mg by mouth every 8 (eight) hours as needed for moderate pain or mild pain.     albuterol (PROAIR HFA) 108 (90 Base) MCG/ACT inhaler INHALE 2 PUFFS EVERY 4  HOURS AS NEEDED FOR  WHEEZING OR SHORTNESS OF  BREATH 51 g 3   Apple Cider Vinegar 600 MG CAPS Take 600 mg by mouth at bedtime.     b complex vitamins capsule Take 1 capsule by mouth at bedtime. With vit B12     bisacodyl (DULCOLAX) 5 MG EC tablet Take 5 mg by mouth at bedtime.     buPROPion (WELLBUTRIN XL) 300 MG 24 hr tablet TAKE 1 TABLET BY MOUTH DAILY 90 tablet 3   Cholecalciferol 50 MCG (2000 UT) TABS Take 2,000 Units by mouth at bedtime.     clindamycin (CLEOCIN) 300 MG capsule Take 600 mg by mouth once.     clonazePAM (KLONOPIN) 1 MG tablet Take 1 tablet (1 mg total) by mouth 2 (two) times daily as needed for anxiety. for anxiety (Patient taking differently: Take 1 mg by mouth at bedtime as needed for anxiety.) 180 tablet 1   desvenlafaxine (PRISTIQ) 50 MG 24 hr tablet TAKE 1 TABLET BY MOUTH DAILY (Patient taking differently: Take 50 mg by mouth at bedtime.) 90 tablet 3   diltiazem (CARDIZEM CD) 240 MG 24 hr capsule Take 1 capsule (240 mg total) by mouth daily. 90 capsule 3   docusate sodium (COLACE) 100 MG capsule Take 1 capsule (100 mg total) by mouth 2 (two) times daily. 30 capsule 0   flecainide (TAMBOCOR) 50 MG tablet Take 1.5 tablets (75 mg total) by mouth 2 (two) times daily. 270 tablet 3   furosemide (LASIX) 20 MG tablet TAKE 1 TABLET BY MOUTH  DAILY AS NEEDED 90 tablet 3   gabapentin (NEURONTIN) 300 MG capsule TAKE 1 CAPSULE(300 MG) BY MOUTH AT BEDTIME 30 capsule 5   rosuvastatin (CRESTOR) 5 MG tablet Take 1 tablet (5 mg total) by mouth daily. 90 tablet 3   senna (SENOKOT) 8.6 MG TABS tablet Take 1 tablet by mouth at bedtime. Vegetable     tamsulosin (FLOMAX) 0.4 MG CAPS capsule TAKE 1 CAPSULE BY MOUTH DAILY 90 capsule 1   XARELTO 20 MG TABS tablet TAKE 1  TABLET(20 MG) BY MOUTH DAILY WITH SUPPER 30 tablet 2   Hypromellose (GENTEAL MILD OP) Place 1 drop into both eyes daily as  needed (dry eyes).     No current facility-administered medications on file prior to visit.    Allergies  Allergen Reactions   Allopurinol Other (See Comments)    PATIENT PREFERENCE Pt refused due to mom developing steven johnson syndrome.   Penicillins Hives    Has patient had a PCN reaction causing immediate rash, facial/tongue/throat swelling, SOB or lightheadedness with hypotension: Unknown Has patient had a PCN reaction causing severe rash involving mucus membranes or skin necrosis:Unknown Has patient had a PCN reaction that required hospitalization: No Has patient had a PCN reaction occurring within the last 10 years: No If all of the above answers are "NO", then may proceed with Cephalosporin use.    Sulfamethoxazole Hives   Sulfonamide Derivatives Hives   Dilaudid [Hydromorphone Hcl] Nausea And Vomiting    Blood pressure 132/80, pulse 68.   Assessment/Plan:     1. Hypertension -  Essential hypertension Assessment: Blood pressure just above goal in clinic today At home blood pressure is elevated in the morning but better controlled in the evening He is on 2 alpha1 blockers, 1 of which can contribute to orthostatic hypotension and falls Struggling with fatigue which may be multifactorial Discouraged use of caffeine for his fatigue CKD prevents the use of ACE/ARB and thiazides We did discuss the possible need to stop his diltiazem and changed to a beta-blocker but will hold off on this at this time  Plan: Stop prazosin Increase hydralazine to 50 mg 3 times a day Continue to monitor blood pressure, use left arm and bring in home cuff to next visit Follow-up in 2 weeks  Thank you  Olene Floss, Pharm.D, BCACP, BCPS, CPP [facility] of [facility] [address] [place], [place] [address]  Phone: [phone]; Fax: [phone]

## 2023-03-08 NOTE — Assessment & Plan Note (Addendum)
Assessment: Blood pressure just above goal in clinic today At home blood pressure is elevated in the morning but better controlled in the evening He is on 2 alpha1 blockers, 1 of which can contribute to orthostatic hypotension and falls Struggling with fatigue which may be multifactorial Discouraged use of caffeine for his fatigue CKD prevents the use of ACE/ARB and thiazides We did discuss the possible need to stop his diltiazem and changed to a beta-blocker but will hold off on this at this time  Plan: Stop prazosin Increase hydralazine to 50 mg 3 times a day Continue to monitor blood pressure, use left arm and bring in home cuff to next visit Follow-up in 2 weeks

## 2023-03-08 NOTE — Patient Instructions (Addendum)
Your blood pressure goal is < 130/70mmHg   STOP prazosin INCREASE hydralazine to 50mg  three times a day Continue diltiazem 240mg  daily and furosemide 20 daily as needed   Important lifestyle changes to control high blood pressure  Intervention  Effect on the BP   Weight loss Weight loss is one of the most effective lifestyle changes for controlling blood pressure. If you're overweight or obese, losing even a small amount of weight can help reduce blood pressure.    Blood pressure can decrease by 1 millimeter of mercury (mmHg) with each kilogram (about 2.2 pounds) of weight lost.   Exercise regularly As a general goal, aim for 30 minutes of moderate physical activity every day.    Regular physical activity can lower blood pressure by 5 - 8 mmHg.   Eat a healthy diet Eat a diet rich in whole grains, fruits, vegetables, lean meat, and low-fat dairy products. Limit processed foods, saturated fat, and sweets.    A heart-healthy diet can lower high blood pressure by 10 mmHg.   Reduce salt (sodium) in your diet Aim for 000mg  of sodium each day. Avoid deli meats, canned food, and frozen microwave meals which are high in sodium.     Limiting sodium can reduce blood pressure by 5 mmHg.   Limit alcohol One drink equals 12 ounces of beer, 5 ounces of wine, or 1.5 ounces of 80-proof liquor.    Limiting alcohol to < 1 drink a day for women or < 2 drinks a day for men can help lower blood pressure by about 4 mmHg.   To check your pressure at home you will need to:   Sit up in a chair, with feet flat on the floor and back supported. Do not cross your ankles or legs. Rest your left arm so that the cuff is about heart level. If the cuff goes on your upper arm, then just relax your arm on the table, arm of the chair, or your lap. If you have a wrist cuff, hold your wrist against your chest at heart level. Place the cuff snugly around your arm, about 1 inch above the crease of your elbow.  The cords should be inside the groove of your elbow.  Sit quietly, with the cuff in place, for about 5 minutes. Then press the power button to start a reading. Do not talk or move while the reading is taking place.  Record your readings on a sheet of paper. Although most cuffs have a memory, it is often easier to see a pattern developing when the numbers are all in front of you.  You can repeat the reading after 1-3 minutes if it is recommended.   Make sure your bladder is empty and you have not had caffeine or tobacco within the last 30 minutes   Always bring your blood pressure log with you to your appointments. If you have not brought your monitor in to be double checked for accuracy, please bring it to your next appointment.   You can find a list of validated (accurate) blood pressure cuffs at: validatebp.org

## 2023-03-12 DIAGNOSIS — Z23 Encounter for immunization: Secondary | ICD-10-CM | POA: Diagnosis not present

## 2023-03-14 ENCOUNTER — Encounter: Payer: Self-pay | Admitting: Internal Medicine

## 2023-03-15 ENCOUNTER — Encounter: Payer: Self-pay | Admitting: Family Medicine

## 2023-03-22 ENCOUNTER — Ambulatory Visit: Payer: Medicare Other

## 2023-03-22 NOTE — Progress Notes (Deleted)
Patient ID: Joseph Tran                 DOB: [birth year]                      MRN: [id number]      HPI: Joseph Tran is a 72 y.o. male referred by Dr. Tenny Craw to HTN clinic. PMH is significant for HTN, thromboembolic dz, CKD (stage III) and PAF. Echo in May showed an EF of 40-45%. On dilitazem due to possible diarrhea with metoprolol. Seen by Dr. Tenny Craw 7/31. BP in office was 126/82 but reported home readings 140/80.   Patient was initially seen in hypertension clinic on 03/08/2023.  Blood pressure in clinic was 132/80.  Patient reported morning blood pressures around 145/90 and 123/80 in the evenings.  I stopped his prazosin due to duplicate therapy with his tamsulosin and concerned that it was adding to his dizziness.  He does have an acoustic neuroma which does add to his balance/dizziness.  Hydralazine was increased to 50 mg 3 times a day.  Patient was struggling with fatigue.  He works on a farm training show horses.  Of note his latest EF is 40 to 45%, on diltiazem for his A-fib due to reported diarrhea with metoprolol.  Only has 1 functioning kidney.  Dizziness, lightheadedness, headache, blurred vision, SOB, swelling Home bp? What arm is using Calibrate home cuff Reviewed diet including salt  His nephrologist resumed his prazosin due to his elevated blood pressure.  However he is also on tamsulosin which is duplicative therapy.  Current HTN meds: diltiazem 240mg  daily, furosemide 20 daily as needed, hydralazine 50mg  three times a day Previously tried: metoprolol (diarrhea) BP goal: <130/80  Family History:  The patient's family history includes Heart disease in his father and another family member; Hyperlipidemia in an other family member; Liver cancer in his paternal grandmother; Stroke in an other family member; Sudden death in an other family member; Throat cancer in his maternal grandmother.   Social History: no ETOH no tobacco  Diet: no caffeine  Exercise:  Trains show  horses  Home BP readings:  145/90 AM 123/80 PM  Wt Readings from Last 3 Encounters:  03/03/23 250 lb (113.4 kg)  02/02/23 256 lb 6.4 oz (116.3 kg)  01/24/23 260 lb (117.9 kg)   BP Readings from Last 3 Encounters:  03/08/23 132/80  03/03/23 (!) 151/86  02/02/23 126/82   Pulse Readings from Last 3 Encounters:  03/08/23 68  03/03/23 87  02/02/23 74    Renal function: CrCl cannot be calculated (Patient's most recent lab result is older than the maximum 21 days allowed.).  Past Medical History:  Diagnosis Date   Acoustic neuroma (HCC)    benign - left   Anxiety    Arthritis    Asthma    seasonal, when pollen is high, cold air closes me up   Cancer [facility])    skin cancer -- arm, scalp, upper back   Chronic kidney disease    sees Dr. Jaye Beagle at [facility], left kidney is non=functioning.  right kidny is at 50%   Clotting disorder (HCC)    Hx DVT - Xarelto   Colon polyps    Complication of anesthesia    Post op nausea/vomiting   Depression    severe.  dx 1985   DVT (deep venous thrombosis) (HCC)    sees Dr. Arlan Organ    History of kidney stones  has kidney stone now.     Hypertension    Hypogonadism male    sees Dr. Hadley Pen at [facility]   Neuropathy of both feet    OSA (obstructive sleep apnea)    Plantar fasciitis    rt foot   Pneumonia    PONV (postoperative nausea and vomiting)    Renal atrophy, left    sees Dr. Hadley Pen at [facility]  [facility]rology   Sleep apnea    tested 2010  - wears c-pap    Current Outpatient Medications on File Prior to Visit  Medication Sig Dispense Refill   acetaminophen (TYLENOL) 500 MG tablet Take 1,000 mg by mouth every 8 (eight) hours as needed for moderate pain or mild pain.     albuterol (PROAIR HFA) 108 (90 Base) MCG/ACT inhaler INHALE 2 PUFFS EVERY 4  HOURS AS NEEDED FOR  WHEEZING OR SHORTNESS OF  BREATH 51 g 3   Apple Cider Vinegar 600 MG CAPS Take 600 mg by mouth at bedtime.     b complex  vitamins capsule Take 1 capsule by mouth at bedtime. With vit B12     bisacodyl (DULCOLAX) 5 MG EC tablet Take 5 mg by mouth at bedtime.     buPROPion (WELLBUTRIN XL) 300 MG 24 hr tablet TAKE 1 TABLET BY MOUTH DAILY 90 tablet 3   Cholecalciferol 50 MCG (2000 UT) TABS Take 2,000 Units by mouth at bedtime.     clindamycin (CLEOCIN) 300 MG capsule Take 600 mg by mouth once.     clonazePAM (KLONOPIN) 1 MG tablet Take 1 tablet (1 mg total) by mouth 2 (two) times daily as needed for anxiety. for anxiety (Patient taking differently: Take 1 mg by mouth at bedtime as needed for anxiety.) 180 tablet 1   desvenlafaxine (PRISTIQ) 50 MG 24 hr tablet TAKE 1 TABLET BY MOUTH DAILY (Patient taking differently: Take 50 mg by mouth at bedtime.) 90 tablet 3   diltiazem (CARDIZEM CD) 240 MG 24 hr capsule Take 1 capsule (240 mg total) by mouth daily. 90 capsule 3   docusate sodium (COLACE) 100 MG capsule Take 1 capsule (100 mg total) by mouth 2 (two) times daily. 30 capsule 0   flecainide (TAMBOCOR) 50 MG tablet Take 1.5 tablets (75 mg total) by mouth 2 (two) times daily. 270 tablet 3   furosemide (LASIX) 20 MG tablet TAKE 1 TABLET BY MOUTH  DAILY AS NEEDED 90 tablet 3   gabapentin (NEURONTIN) 300 MG capsule TAKE 1 CAPSULE(300 MG) BY MOUTH AT BEDTIME 30 capsule 5   hydrALAZINE (APRESOLINE) 50 MG tablet Take 1 tablet (50 mg total) by mouth 3 (three) times daily. 270 tablet 3   Hypromellose (GENTEAL MILD OP) Place 1 drop into both eyes daily as needed (dry eyes).     rosuvastatin (CRESTOR) 5 MG tablet Take 1 tablet (5 mg total) by mouth daily. 90 tablet 3   senna (SENOKOT) 8.6 MG TABS tablet Take 1 tablet by mouth at bedtime. Vegetable     tamsulosin (FLOMAX) 0.4 MG CAPS capsule TAKE 1 CAPSULE BY MOUTH DAILY 90 capsule 1   XARELTO 20 MG TABS tablet TAKE 1 TABLET(20 MG) BY MOUTH DAILY WITH SUPPER 30 tablet 2   No current facility-administered medications on file prior to visit.    Allergies  Allergen Reactions    Allopurinol Other (See Comments)    PATIENT PREFERENCE Pt refused due to mom developing steven johnson syndrome.   Penicillins Hives    Has patient  had a PCN reaction causing immediate rash, facial/tongue/throat swelling, SOB or lightheadedness with hypotension: Unknown Has patient had a PCN reaction causing severe rash involving mucus membranes or skin necrosis:Unknown Has patient had a PCN reaction that required hospitalization: No Has patient had a PCN reaction occurring within the last 10 years: No If all of the above answers are "NO", then may proceed with Cephalosporin use.    Sulfamethoxazole Hives   Sulfonamide Derivatives Hives   Dilaudid [Hydromorphone Hcl] Nausea And Vomiting    There were no vitals taken for this visit.   Assessment/Plan: No BP recorded.  {Refresh Note OR Click here to enter BP  :1}***   1. Hypertension -  No problem-specific Assessment & Plan notes found for this encounter.   Thank you  Olene Floss, Pharm.D, BCACP, BCPS, CPP  [facility] [address] [place], [place] [address]  Phone: [phone]; Fax: [phone]

## 2023-03-25 ENCOUNTER — Encounter: Payer: Self-pay | Admitting: Pharmacist

## 2023-03-25 ENCOUNTER — Ambulatory Visit: Payer: Medicare Other | Attending: Internal Medicine | Admitting: Pharmacist

## 2023-03-25 VITALS — BP 132/72 | HR 72

## 2023-03-25 DIAGNOSIS — I10 Essential (primary) hypertension: Secondary | ICD-10-CM | POA: Diagnosis not present

## 2023-03-25 MED ORDER — CARVEDILOL 12.5 MG PO TABS: 12.5000 mg | ORAL_TABLET | Freq: Two times a day (BID) | ORAL | 5 refills | Status: DC

## 2023-03-25 NOTE — Progress Notes (Signed)
Patient ID: Joseph Tran                 DOB: 03-Apr-1951                      MRN: [id number]      HPI: Joseph Tran is a 72 y.o. male referred by Dr. Tenny Craw to HTN clinic. PMH is significant for HTN, thromboembolic disease, CKD stage III, and PAF. Echo in May showed an EF of 40-45%. On dilitazem due to possible diarrhea with metoprolol. Seen by Dr. Tenny Craw 7/31. BP in office was 126/82 but reported home readings 140/80.   Patient was initially seen in hypertension clinic 03/08/23. Blood pressure in clinic was 132/80. Prazosin was stopped due to duplicate therapy with his tamsulosin and concern for adding to his dizziness. 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 His hydralazine was increased to 50mg  TID. Only has 1 functioning kidney. Patient was struggling with fatigue.  He works on a farm training show horses.   Pt presents today for follow up. He has been feeling very poorly over the last few months which he attribute to side effects of his medications. His main concerns include:  Blurry vision - despite updating his glasses earlier this year Muscle weakness - bilateral thigh pain, hips, neck, and arm pain. Rosuvastatin 5mg  started on 8/6, has never taken other cholesterol medication before. Balance issues/dizziness - about the same as last visit. Didn't improve despite stopping prazosin. Had a fall about 2 weeks ago when working with his horses. Feeling dizzy with positional changes. BP has not been running low. Does have dizziness from his acoustic neuroma but symptoms have been worse over the past few months. Impotence Chronic fatigue  He brings in his home BP cuff today, reviewed log of readings. Home cuff is measuring accurately - 139/78 using his cuff (L arm); 132/72 (L arm) and 136/78 (R arm) with my manual reading. Has only used prn Lasix once in the past few weeks. Denies LE edema. Occasional headache, has one today but didn't sleep well.  Current HTN meds:  diltiazem 240mg  daily, hydralazine 50mg  TID (8am, 4pm, midnight), furosemide 20 daily prn Previously tried: metoprolol (diarrhea, fatigue) BP goal: <130/80  Family History:  The patient's family history includes Heart disease in his father and another family member; Hyperlipidemia in an other family member; Liver cancer in his paternal grandmother; Stroke in an other family member; Sudden death in an other family member; Throat cancer in his maternal grandmother.   Social History: no ETOH no tobacco  Diet: no caffeine. Drinks water and Gatorade Zero.  Exercise:  Trains show horses  Home BP readings: brings in cuff today. 138/77, 142/73, 145/84, 127/60, 135/68, 134/74, 154/82, 132/63, 163/87, 146/83, 148/77, 137/76, 144/77, 151/86  Wt Readings from Last 3 Encounters:  03/03/23 250 lb (113.4 kg)  02/02/23 256 lb 6.4 oz (116.3 kg)  01/24/23 260 lb (117.9 kg)   BP Readings from Last 3 Encounters:  03/08/23 132/80  03/03/23 (!) 151/86  02/02/23 126/82   Pulse Readings from Last 3 Encounters:  03/08/23 68  03/03/23 87  02/02/23 74    Renal function: CrCl cannot be calculated (Patient's most recent lab result is older than the maximum 21 days allowed.).  Past Medical History:  Diagnosis Date   Acoustic neuroma (HCC)    benign - left   Anxiety    Arthritis    Asthma    seasonal, when pollen  is high, cold air closes me up   Cancer [facility])    skin cancer -- arm, scalp, upper back   Chronic kidney disease    sees Dr. Jaye Beagle at [facility], left kidney is non=functioning.  right kidny is at 50%   Clotting disorder (HCC)    Hx DVT - Xarelto   Colon polyps    Complication of anesthesia    Post op nausea/vomiting   Depression    severe.  dx 1985   DVT (deep venous thrombosis) (HCC)    sees Dr. Arlan Organ    History of kidney stones    has kidney stone now.     Hypertension    Hypogonadism male    sees Dr. Hadley Pen at [facility]   Neuropathy of  both feet    OSA (obstructive sleep apnea)    Plantar fasciitis    rt foot   Pneumonia    PONV (postoperative nausea and vomiting)    Renal atrophy, left    sees Dr. Hadley Pen at [facility]  [facility]   Sleep apnea    tested 2010  - wears c-pap    Current Outpatient Medications on File Prior to Visit  Medication Sig Dispense Refill   acetaminophen (TYLENOL) 500 MG tablet Take 1,000 mg by mouth every 8 (eight) hours as needed for moderate pain or mild pain.     albuterol (PROAIR HFA) 108 (90 Base) MCG/ACT inhaler INHALE 2 PUFFS EVERY 4  HOURS AS NEEDED FOR  WHEEZING OR SHORTNESS OF  BREATH 51 g 3   Apple Cider Vinegar 600 MG CAPS Take 600 mg by mouth at bedtime.     b complex vitamins capsule Take 1 capsule by mouth at bedtime. With vit B12     bisacodyl (DULCOLAX) 5 MG EC tablet Take 5 mg by mouth at bedtime.     buPROPion (WELLBUTRIN XL) 300 MG 24 hr tablet TAKE 1 TABLET BY MOUTH DAILY 90 tablet 3   Cholecalciferol 50 MCG (2000 UT) TABS Take 2,000 Units by mouth at bedtime.     clindamycin (CLEOCIN) 300 MG capsule Take 600 mg by mouth once.     clonazePAM (KLONOPIN) 1 MG tablet Take 1 tablet (1 mg total) by mouth 2 (two) times daily as needed for anxiety. for anxiety (Patient taking differently: Take 1 mg by mouth at bedtime as needed for anxiety.) 180 tablet 1   desvenlafaxine (PRISTIQ) 50 MG 24 hr tablet TAKE 1 TABLET BY MOUTH DAILY (Patient taking differently: Take 50 mg by mouth at bedtime.) 90 tablet 3   diltiazem (CARDIZEM CD) 240 MG 24 hr capsule Take 1 capsule (240 mg total) by mouth daily. 90 capsule 3   docusate sodium (COLACE) 100 MG capsule Take 1 capsule (100 mg total) by mouth 2 (two) times daily. 30 capsule 0   flecainide (TAMBOCOR) 50 MG tablet Take 1.5 tablets (75 mg total) by mouth 2 (two) times daily. 270 tablet 3   furosemide (LASIX) 20 MG tablet TAKE 1 TABLET BY MOUTH  DAILY AS NEEDED 90 tablet 3   gabapentin (NEURONTIN) 300 MG capsule TAKE 1 CAPSULE(300 MG) BY MOUTH AT  BEDTIME 30 capsule 5   hydrALAZINE (APRESOLINE) 50 MG tablet Take 1 tablet (50 mg total) by mouth 3 (three) times daily. 270 tablet 3   Hypromellose (GENTEAL MILD OP) Place 1 drop into both eyes daily as needed (dry eyes).     rosuvastatin (CRESTOR) 5 MG tablet Take 1 tablet (5  mg total) by mouth daily. 90 tablet 3   senna (SENOKOT) 8.6 MG TABS tablet Take 1 tablet by mouth at bedtime. Vegetable     tamsulosin (FLOMAX) 0.4 MG CAPS capsule TAKE 1 CAPSULE BY MOUTH DAILY 90 capsule 1   XARELTO 20 MG TABS tablet TAKE 1 TABLET(20 MG) BY MOUTH DAILY WITH SUPPER 30 tablet 2   No current facility-administered medications on file prior to visit.    Allergies  Allergen Reactions   Allopurinol Other (See Comments)    PATIENT PREFERENCE Pt refused due to mom developing steven johnson syndrome.   Penicillins Hives    Has patient had a PCN reaction causing immediate rash, facial/tongue/throat swelling, SOB or lightheadedness with hypotension: Unknown Has patient had a PCN reaction causing severe rash involving mucus membranes or skin necrosis:Unknown Has patient had a PCN reaction that required hospitalization: No Has patient had a PCN reaction occurring within the last 10 years: No If all of the above answers are "NO", then may proceed with Cephalosporin use.    Sulfamethoxazole Hives   Sulfonamide Derivatives Hives   Dilaudid [Hydromorphone Hcl] Nausea And Vomiting     Hypertension - BP well controlled in clinic today however home systolic readings have been running 130-150 primarily. Home BP cuff is measuring accurately. Will stop his diltiazem 240mg  daily secondary to mildly reduced EF of 40-45%. Prior fatigue/diarrhea on metoprolol. Will try carvedilol 12.5mg  BID for BP control and rate control for his afib. He will continue on hydralazine 50mg  TID and prn furosemide.  Medication side effects - Patient's primary complaint today, he has been feeling very poorly over the past few months: Blurry  vision - likely secondary to his flecainide. Visual disturbances including blurry vision reported in up to 16% of patients. Advised pt I will forward to Dr Tenny Craw for input on potentially changing to different antiarrhythmic. Muscle weakness - has affected his ability to walk. Bilateral myalgias in his thighs, neck, arms, and hips. He was started on rosuvastatin 5mg  daily on 8/6, this is the first cholesterol medication he has taken. Will stop rosuvastatin and advised pt to monitor for symptom improvement over the next 2 weeks. Will discuss alternative med options pending symptom improvement. Balance issues/dizziness - had a fall 2 weeks ago. BP has not been running low. Does have baseline dizziness from his acoustic neuroma however he has been feeling more dizzy than normal. Flecainide also has a reported incidence of dizziness of 19%. See above re: changing antiarrhythmic per Dr Tenny Craw. Impotence - has worsened over past few months. Hopefully will improve with stopping his diltiazem (~2% reported incidence). Chronic fatigue - discussed this may be multifactorial secondary to his afib, potentially his mildly reduced EF. Asked him to discuss SGLT2i with his nephrologist (only has 1 functioning kidney so will defer to nephrology regarding use of med class).  Cristian Grieves E. Malorie Bigford, PharmD, BCACP, CPP [facility] [address] [place], [place] [address] Phone: [phone]; Fax: [phone] 03/25/2023 10:02 AM

## 2023-03-25 NOTE — Patient Instructions (Addendum)
Your blurry vision and dizziness may be coming from your flecainide. I'll message Dr Tenny Craw to see if she would like to change this medication  Your muscle cramps/weakness are most likely coming from your rosuvastatin. Stop taking this medication for the next 2 weeks and see if your aches improve/resolve  Stop taking diltiazem - this medication is not preferred for patients who have a reduced ejection fraction  Start taking carvedilol 12.5mg  - 1 tablet twice daily  Ask your nephrologist about either Jardiance or [place]. This type of medication can help provide cardiac and kidney protection

## 2023-03-27 ENCOUNTER — Encounter: Payer: Self-pay | Admitting: Family Medicine

## 2023-03-28 MED ORDER — RIVAROXABAN 20 MG PO TABS: 20.0000 mg | ORAL_TABLET | Freq: Every day | ORAL | 0 refills | Status: DC

## 2023-04-01 ENCOUNTER — Encounter (HOSPITAL_COMMUNITY): Payer: Self-pay | Admitting: Physician Assistant

## 2023-04-01 ENCOUNTER — Ambulatory Visit (HOSPITAL_COMMUNITY)
Admission: RE | Admit: 2023-04-01 | Discharge: 2023-04-01 | Disposition: A | Discharge status: Home / Self Care | Payer: Medicare Other | Source: Ambulatory Visit | Attending: Physician Assistant | Admitting: Physician Assistant

## 2023-04-01 VITALS — BP 124/74 | HR 66 | Ht 72.0 in | Wt 260.8 lb

## 2023-04-01 DIAGNOSIS — I5032 Chronic diastolic (congestive) heart failure: Secondary | ICD-10-CM | POA: Diagnosis not present

## 2023-04-01 DIAGNOSIS — I4819 Other persistent atrial fibrillation: Secondary | ICD-10-CM

## 2023-04-01 DIAGNOSIS — Z79899 Other long term (current) drug therapy: Secondary | ICD-10-CM

## 2023-04-01 DIAGNOSIS — N189 Chronic kidney disease, unspecified: Secondary | ICD-10-CM | POA: Diagnosis not present

## 2023-04-01 DIAGNOSIS — D6869 Other thrombophilia: Secondary | ICD-10-CM | POA: Diagnosis not present

## 2023-04-01 DIAGNOSIS — I130 Hypertensive heart and chronic kidney disease with heart failure and stage 1 through stage 4 chronic kidney disease, or unspecified chronic kidney disease: Secondary | ICD-10-CM | POA: Diagnosis not present

## 2023-04-01 DIAGNOSIS — G4733 Obstructive sleep apnea (adult) (pediatric): Secondary | ICD-10-CM | POA: Diagnosis not present

## 2023-04-01 DIAGNOSIS — Z7901 Long term (current) use of anticoagulants: Secondary | ICD-10-CM | POA: Diagnosis not present

## 2023-04-01 DIAGNOSIS — Z5181 Encounter for therapeutic drug level monitoring: Secondary | ICD-10-CM

## 2023-04-01 NOTE — Progress Notes (Signed)
Primary Care Physician: Nelwyn Salisbury, MD Primary Cardiologist: Dietrich Pates, MD Electrophysiologist: None  Referring Physician: Dr Azucena Kuba is a 72 y.o. male with a history of HFrEF, HTN, CKD, OSA, DVT, atrial fibrillation who presents for follow up in the [facility].  The patient was initially diagnosed with atrial fibrillation 11/2022 at a hematology appointment.  He wore a cardiac monitor which showed 100% afib burden. He was started on flecainide and underwent DCCV on 01/05/23. Patient is on Xarelto for a CHADS2VASC score of 3.  He was seen at the HTN clinic 03/25/23 and described blurry vision for the past few months, possibly related to flecainide. He is referred to the AF clinic to discuss alternate rhythm strategies.   On follow up today, patient remains in SR. Since around the time he started flecainide, he has had some vision blurring and intermittent dizziness, not severe but irritating to him. No falls or presyncope. No bleeding issues on anticoagulation.   Today, he denies symptoms of palpitations, chest pain, shortness of breath, orthopnea, PND, lower extremity edema, presyncope, syncope, snoring, daytime somnolence, bleeding, or neurologic sequela. The patient is tolerating medications without difficulties and is otherwise without complaint today.    Atrial Fibrillation Risk Factors:  he does have symptoms or diagnosis of sleep apnea. he is compliant with CPAP therapy. he does not have a history of rheumatic fever.   Atrial Fibrillation Management history:  Previous antiarrhythmic drugs: flecainide Previous cardioversions: 01/05/23 Previous ablations: none Anticoagulation history: Xarelto  ROS- All systems are reviewed and negative except as per the HPI above.  Past Medical History:  Diagnosis Date   Acoustic neuroma (HCC)    benign - left   Anxiety    Arthritis    Asthma    seasonal, when pollen is high, cold air closes me  up   Cancer [facility])    skin cancer -- arm, scalp, upper back   Chronic kidney disease    sees Dr. Jaye Beagle at [facility] Ne[facility]rology, left kidney is non=functioning.  right kidny is at 50%   Clotting disorder (HCC)    Hx DVT - Xarelto   Colon polyps    Complication of anesthesia    Post op nausea/vomiting   Depression    severe.  dx 1985   DVT (deep venous thrombosis) (HCC)    sees Dr. Arlan Organ    History of kidney stones    has kidney stone now.     Hypertension    Hypogonadism male    se[facility] Dr. Hadley Pen at [facility]   Neuropathy of both feet    OSA (obstructive sleep apnea)    Plantar fasciitis    rt foot   Pneumonia    PONV (postoperative nausea and vomiting)    Renal atrophy, left    sees Dr. Hadley Pen at [facility]  Urology   Sleep apnea    tested 2010  - wears c-pap    Current Outpatient Medications  Medication Sig Dispense Refill   acetaminophen (TYLENOL) 500 MG tablet Take 1,000 mg by mouth every 8 (eight) hours as needed for moderate pain or mild pain.     albuterol (PROAIR HFA) 108 (90 Base) MCG/ACT inhaler INHALE 2 PUFFS EVERY 4  HOURS AS NEEDED FOR  WHEEZING OR SHORTNESS OF  BREATH 51 g 3   Apple Cider Vinegar 600 MG CAPS Take 600 mg by mouth at bedtime.     b  complex vitamins capsule Take 1 capsule by mouth at bedtime. With vit B12     bisacodyl (DULCOLAX) 5 MG EC tablet Take 5 mg by mouth at bedtime.     buPROPion (WELLBUTRIN XL) 300 MG 24 hr tablet TAKE 1 TABLET BY MOUTH DAILY 90 tablet 3   carvedilol (COREG) 12.5 MG tablet Take 1 tablet (12.5 mg total) by mouth 2 (two) times daily. 60 tablet 5   Cholecalciferol 50 MCG (2000 UT) TABS Take 2,000 Units by mouth at bedtime.     clindamycin (CLEOCIN) 300 MG capsule Take 600 mg by mouth once.     clonazePAM (KLONOPIN) 1 MG tablet Take 1 tablet (1 mg total) by mouth 2 (two) times daily as needed for anxiety. for anxiety (Patient taking differently: Take 1 mg by mouth at bedtime as needed for anxiety.)  180 tablet 1   desvenlafaxine (PRISTIQ) 50 MG 24 hr tablet TAKE 1 TABLET BY MOUTH DAILY (Patient taking differently: Take 50 mg by mouth at bedtime.) 90 tablet 3   docusate sodium (COLACE) 100 MG capsule Take 1 capsule (100 mg total) by mouth 2 (two) times daily. 30 capsule 0   flecainide (TAMBOCOR) 50 MG tablet Take 1.5 tablets (75 mg total) by mouth 2 (two) times daily. 270 tablet 3   furosemide (LASIX) 20 MG tablet TAKE 1 TABLET BY MOUTH  DAILY AS NEEDED 90 tablet 3   gabapentin (NEURONTIN) 300 MG capsule TAKE 1 CAPSULE(300 MG) BY MOUTH AT BEDTIME 30 capsule 5   hydrALAZINE (APRESOLINE) 50 MG tablet Take 1 tablet (50 mg total) by mouth 3 (three) times daily. 270 tablet 3   Hypromellose (GENTEAL MILD OP) Place 1 drop into both eyes daily as needed (dry eyes).     rivaroxaban (XARELTO) 20 MG TABS tablet Take 1 tablet (20 mg total) by mouth daily with supper. 90 tablet 0   senna (SENOKOT) 8.6 MG TABS tablet Take 1 tablet by mouth at bedtime. Vegetable     tamsulosin (FLOMAX) 0.4 MG CAPS capsule TAKE 1 CAPSULE BY MOUTH DAILY 90 capsule 1   No current facility-administered medications for this encounter.    Physical Exam: BP 124/74   Pulse 66   Ht 6' (1.829 m)   Wt 118.3 kg   BMI 35.37 kg/m   GEN: Well nourished, well developed in no acute distress NECK: No JVD; No carotid bruits CARDIAC: Regular rate and rhythm, no murmurs, rubs, gallops RESPIRATORY:  Clear to auscultation without rales, wheezing or rhonchi  ABDOMEN: Soft, non-tender, non-distended EXTREMITIES:  No edema; No deformity   Wt Readings from Last 3 Encounters:  04/01/23 118.3 kg  03/03/23 113.4 kg  02/02/23 116.3 kg     EKG today demonstrates  SR, PAC Vent. rate 66 BPM PR interval 168 ms QRS duration 90 ms QT/QTcB 388/406 ms   Echo 12/23/22 demonstrated   1. Left ventricular ejection fraction, by estimation, is 40 to 45%. The  left ventricle has mildly decreased function. The left ventricle has no  regional  wall motion abnormalities. Left ventricular diastolic parameters  are indeterminate.   2. Right ventricular systolic function is normal. The right ventricular  size is normal.   3. Left atrial size was moderately dilated.   4. The mitral valve is normal in structure. Moderate mitral valve  regurgitation. No evidence of mitral stenosis.   5. The aortic valve is normal in structure. Aortic valve regurgitation is  not visualized. No aortic stenosis is present.   6. The inferior  vena cava is normal in size with greater than 50%  respiratory variability, suggesting right atrial pressure of 3 mmHg.    CHA2DS2-VASc Score = 3  The patient's score is based upon: CHF History: 1 HTN History: 1 Diabetes History: 0 Stroke History: 0 Vascular Disease History: 0 Age Score: 1 Gender Score: 0       ASSESSMENT AND PLAN: Persistent Atrial Fibrillation (ICD10:  I48.19) The patient's CHA2DS2-VASc score is 3, indicating a 3.2% annual risk of stroke.   Patient is SR today. We discussed rhythm control options today including changing AAD vs ablation. He is agreeable to consultation with EP to discuss ablation.  Patient currently on flecainide 75 mg BID. He would like to continue the medication until ablation. Will recheck echocardiogram. If his EF remains decreased will need to discontinue.  Continue carvedilol 12.5 mg BID Continue Xarelto 20 mg daily  Secondary Hypercoagulable State (ICD10:  D68.69) The patient is at significant risk for stroke/thromboembolism based upon his CHA2DS2-VASc Score of 3.  Continue Rivaroxaban (Xarelto).   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    Follow up with EP to establish care and discuss ablation.       Jorja Loa  PA-C Afib Clinic [facility] [address] [place], [place] [address] [phone]

## 2023-04-08 ENCOUNTER — Telehealth: Payer: Self-pay | Admitting: Pharmacist

## 2023-04-08 NOTE — Telephone Encounter (Signed)
Called pt to follow up with statin holiday. Reports muscle aches are improving but not gone yet. Still fatigued, I think this is more from his afib. Sees EP in a few weeks. I'll touch base with him in another 2 weeks, will try different cholesterol medication once he's feeling back to his baseline.

## 2023-04-13 NOTE — Progress Notes (Signed)
Cardiology Office Note   Date:  04/15/2023   ID:  Joseph Tran, DOB [birth year], MRN [id number]  PCP:  Nelwyn Salisbury, MD  Cardiologist:   Dietrich Pates, MD   Patient  here for follow up of atrial fibrillation    History of Present Illness: Joseph Tran is a 72 y.o. male with a history of HTN, thromboembolic dz,  CKD  (stage III) and  PAF (dx April 2024, started on Xarelto)   May 2024 Pt exhausted  he is followed by Claris Che in April  2024  Started on xarelto    Seen by Claris Che again in early May   Clnic note says he was in SR No EKG    I saw the pt for the first time on Dec 03, 2022.   He was exhausted, felt heart racing, dizzy at times. Metoprolol was added (25 bid)   The pt had monitor placed that showed he was in Afib 100% time    Flecanide started, initially 50 bid   The pt says with these meds he has felt very tired, light headed at times    He underwent DCCCV on 01/05/23,  said he felt a little "time warp" after the procedure.  HE said he did not have this sensation  when he was given propofol in past   The pt called in on 01/17/23  Was having diarrhea  Questioned if due to meds   Metoprolol was stopped   Cardiazem added  240 Mg  In July the pt pt complained of continued generalized fatigue even with sleep, using CPAP  No palpitations    Seen by Gregor Hams in Sept for BP and by C Fenton for afib   He complained of blurry vision at times  No palpitations   Today he again denies palpitations    Still complains of fatigue   Muscles ache   Tires easily with activity   Asks if it is his heart      Says his vision is weak.   Muscles ache,  Complains of impotence    Current Meds  Medication Sig   acetaminophen (TYLENOL) 500 MG tablet Take 1,000 mg by mouth every 8 (eight) hours as needed for moderate pain or mild pain.   albuterol (PROAIR HFA) 108 (90 Base) MCG/ACT inhaler INHALE 2 PUFFS EVERY 4  HOURS AS NEEDED FOR  WHEEZING OR SHORTNESS OF  BREATH   Apple Cider Vinegar 600 MG CAPS Take 600  mg by mouth at bedtime.   b complex vitamins capsule Take 1 capsule by mouth at bedtime. With vit B12   bisacodyl (DULCOLAX) 5 MG EC tablet Take 5 mg by mouth at bedtime.   buPROPion (WELLBUTRIN XL) 300 MG 24 hr tablet TAKE 1 TABLET BY MOUTH DAILY   carvedilol (COREG) 12.5 MG tablet Take 1 tablet (12.5 mg total) by mouth 2 (two) times daily.   Cholecalciferol 50 MCG (2000 UT) TABS Take 2,000 Units by mouth at bedtime.   clindamycin (CLEOCIN) 300 MG capsule Take 600 mg by mouth once.   clonazePAM (KLONOPIN) 1 MG tablet Take 1 tablet (1 mg total) by mouth 2 (two) times daily as needed for anxiety. for anxiety (Patient taking differently: Take 1 mg by mouth at bedtime as needed for anxiety.)   desvenlafaxine (PRISTIQ) 50 MG 24 hr tablet TAKE 1 TABLET BY MOUTH DAILY (Patient taking differently: Take 50 mg by mouth at bedtime.)   docusate sodium (COLACE) 100 MG capsule  Take 1 capsule (100 mg total) by mouth 2 (two) times daily.   flecainide (TAMBOCOR) 50 MG tablet Take 1.5 tablets (75 mg total) by mouth 2 (two) times daily.   furosemide (LASIX) 20 MG tablet TAKE 1 TABLET BY MOUTH  DAILY AS NEEDED   gabapentin (NEURONTIN) 300 MG capsule TAKE 1 CAPSULE(300 MG) BY MOUTH AT BEDTIME   hydrALAZINE (APRESOLINE) 50 MG tablet Take 1 tablet (50 mg total) by mouth 3 (three) times daily.   Hypromellose (GENTEAL MILD OP) Place 1 drop into both eyes daily as needed (dry eyes).   rivaroxaban (XARELTO) 20 MG TABS tablet Take 1 tablet (20 mg total) by mouth daily with supper.   senna (SENOKOT) 8.6 MG TABS tablet Take 1 tablet by mouth at bedtime. Vegetable   tamsulosin (FLOMAX) 0.4 MG CAPS capsule TAKE 1 CAPSULE BY MOUTH DAILY     Allergies:   Allopurinol, Penicillins, Sulfamethoxazole, Sulfonamide derivatives, and Dilaudid [hydromorphone hcl]   Past Medical History:  Diagnosis Date   Acoustic neuroma (HCC)    benign - left   Anxiety    Arthritis    Asthma    seasonal, when pollen is high, cold air closes me  up   Cancer (HCC)    skin cancer -- arm, scalp, upper back   Chronic kidney disease    sees Dr. Jaye Beagle at [facility] Nephrology, left kidney is non=functioning.  right kidny is at 50%   Clotting disorder (HCC)    Hx DVT - Xarelto   Colon polyps    Complication of anesthesia    Post op nausea/vomiting   Depression    severe.  dx 1985   DVT (deep venous thrombosis) (HCC)    sees Dr. Arlan Organ    History of kidney stones    has kidney stone now.     Hypertension    Hypogonadism male    sees Dr. Hadley Pen at [facility]rology   Neuropathy of both feet    OSA (obstructive sleep apnea)    Plantar fasciitis    rt foot   Pneumonia    PONV (postoperative nausea and vomiting)    Renal atrophy, left    sees Dr. Hadley Pen at [facility]  Urology   Sleep apnea    tested 2010  - wears c-pap    Past Surgical History:  Procedure Laterality Date   CARDIOVERSION N/A 01/05/2023   Procedure: CARDIOVERSION;  Surgeon: Jodelle Red, MD;  Location: [facility] INVASIVE CV LAB;  Service: Cardiovascular;  Laterality: N/A;   CHEST WALL TUMOR EXCISION  2007   COLONOSCOPY  04/07/2020   per Dr. Rhea Belton, adenomatous polyps, repeat in 3 yrs    kidney caluculs  2004-05   KIDNEY STONE SURGERY     x 2c   KNEE ARTHROSCOPY  09/10/2011   right knee, per Dr. Jodi Geralds    KNEE ARTHROSCOPY Right 04/25/2017   Procedure: RIGHT KNEE ARTHROSCOPY, PARTIAL LATERAL MENISECTOMY, CHONDROPLASTY MEDIAL AND LATERAL PATELLAOFEMORAL;  Surgeon: Jodi Geralds, MD;  Location: [facility] OR;  Service: Orthopedics;  Laterality: Right;   KNEE ARTHROSCOPY Left 09/18/2021   Procedure: ARTHROSCOPY KNEE;  Surgeon: Jodi Geralds, MD;  Location: [facility] ORS;  Service: Orthopedics;  Laterality: Left;   KNEE ARTHROSCOPY WITH MEDIAL MENISECTOMY Left 09/18/2021   Procedure: KNEE ARTHROSCOPY WITH MEDIAL MENISECTOMY;  Surgeon: Jodi Geralds, MD;  Location: [facility] ORS;  Service: Orthopedics;  Laterality: Left;   madiscus cartilage  2009   MOHS SURGERY      right knee  arthroscopy  2008   TONSILLECTOMY     TOTAL KNEE ARTHROPLASTY Right 01/19/2019   Procedure: RIGHT TOTAL KNEE ARTHROPLASTY;  Surgeon: Jodi Geralds, MD;  Location: [facility] ORS;  Service: Orthopedics;  Laterality: Right;   TOTAL KNEE ARTHROPLASTY Left 06/14/2022   Procedure: TOTAL KNEE ARTHROPLASTY;  Surgeon: Jodi Geralds, MD;  Location: [facility] ORS;  Service: Orthopedics;  Laterality: Left;   UPPER GASTROINTESTINAL ENDOSCOPY       Social History:  The patient  reports that he has never smoked. He has never used smokeless tobacco. He reports that he does not drink alcohol and does not use drugs.   Family History:  The patient's family history includes Heart disease in his father and another family member; Hyperlipidemia in an other family member; Liver cancer in his paternal grandmother; Stroke in an other family member; Sudden death in an other family member; Throat cancer in his maternal grandmother.    ROS:  Please see the history of present illness. All other systems are reviewed and  Negative to the above problem except as noted.    PHYSICAL EXAM: VS:  BP 138/84 (BP Location: Left Arm)   Pulse 67   Ht 6' (1.829 m)   Wt 257 lb 12.8 oz (116.9 kg)   SpO2 95%   BMI 34.96 kg/m   GEN: Obese 72 yo  in no acute distress  HEENT: normal  Neck: no JVD, no carotid bruits Cardiac: RRR  No S3   No murmurs  No LE  edema  Respiratory:  CTA  GI: soft, nontender No masses No hepatomegaly  MS: no deformity Moving all extremities      EKG:  EKG is not done today  Monitor June 2024   Rhythm:   Atrial fibrillation   Rates 44 to 158 bpm   Average HR 96 bpm On short burst of ventricular tachycardia  (5 beats at 156 bpm).    Echo   December 23 2022 1. Left ventricular ejection fraction, by estimation, is 40 to 45%. The  left ventricle has mildly decreased function. The left ventricle has no  regional wall motion abnormalities. Left ventricular diastolic parameters  are indeterminate.   2.  Right ventricular systolic function is normal. The right ventricular  size is normal.   3. Left atrial size was moderately dilated.   4. The mitral valve is normal in structure. Moderate mitral valve  regurgitation. No evidence of mitral stenosis.   5. The aortic valve is normal in structure. Aortic valve regurgitation is  not visualized. No aortic stenosis is present.   6. The inferior vena cava is normal in size with greater than 50%  respiratory variability, suggesting right atrial pressure of 3 mmHg.   Lipid Panel    Component Value Date/Time   CHOL 200 07/21/2021 0841   TRIG 129.0 07/21/2021 0841   HDL 47.80 07/21/2021 0841   CHOLHDL 4 07/21/2021 0841   VLDL 25.8 07/21/2021 0841   LDLCALC 126 (H) 07/21/2021 0841   LDLCALC 105 (H) 06/04/2020 0950      Wt Readings from Last 3 Encounters:  04/14/23 257 lb 12.8 oz (116.9 kg)  04/01/23 260 lb 12.8 oz (118.3 kg)  03/03/23 250 lb (113.4 kg)        ASSESSMENT AND PLAN:  1  Atrial fibrillation  Pt remains in SR clinically   On carvedilol, flecanide, xarelto    Has appt in EP for consideration of ablation     2  Weakness, fatigue  PT with vague complaints Overall fatigue  Denies CP  Volume status is good     LVEF was moderately down in Jun,2024  Felt due to tachycardia Pt questions risks of CAD   Will set up for repeat echo to reevaluate LVEF and  CCTA to evaluate coronary arteries  3 Hx HFrEF   Echo in May shows LVEF 40s%  Volume status OK today    ? Tachyrelated  Repeat echo    Keep on carvedilol and hydralazine      4  Hx of HTN  BP airly well controlled on current regimen     5 CKD  Cr 1.48 in July 2024   6   Hx of thromboembolic dz leg  Continue Xarelto  Followed in heme clinic ([facility])   Current medicines are reviewed at length with the patient today.  The patient does not have concerns regarding medicines.  Signed, Dietrich Pates, MD  04/15/2023 8:24 AM    [facility] [address] [place],  [place], [place]  [address] Phone: [phone]; Fax: [phone]

## 2023-04-14 ENCOUNTER — Ambulatory Visit: Payer: Medicare Other | Attending: Internal Medicine | Admitting: Internal Medicine

## 2023-04-14 ENCOUNTER — Ambulatory Visit: Payer: Medicare Other

## 2023-04-14 ENCOUNTER — Encounter: Payer: Self-pay | Admitting: Internal Medicine

## 2023-04-14 VITALS — BP 138/84 | HR 67 | Ht 72.0 in | Wt 257.8 lb

## 2023-04-14 DIAGNOSIS — R072 Precordial pain: Secondary | ICD-10-CM | POA: Diagnosis not present

## 2023-04-14 DIAGNOSIS — I4819 Other persistent atrial fibrillation: Secondary | ICD-10-CM | POA: Insufficient documentation

## 2023-04-14 DIAGNOSIS — R5382 Chronic fatigue, unspecified: Secondary | ICD-10-CM | POA: Diagnosis not present

## 2023-04-14 DIAGNOSIS — E785 Hyperlipidemia, unspecified: Secondary | ICD-10-CM | POA: Diagnosis not present

## 2023-04-14 DIAGNOSIS — I10 Essential (primary) hypertension: Secondary | ICD-10-CM | POA: Insufficient documentation

## 2023-04-14 NOTE — Patient Instructions (Signed)
Medication Instructions:   *If you need a refill on your cardiac medications before your next appointment, please call your pharmacy*   Lab Work: BMET, CK, SED RATE TODAY  If you have labs (blood work) drawn today and your tests are completely normal, you will receive your results only by: MyChart Message (if you have MyChart) OR A paper copy in the mail If you have any lab test that is abnormal or we need to change your treatment, we will call you to review the results.   Testing/Procedures:   Your cardiac CT will be scheduled at one of the below locations:   [facility] [address] [place], [place] [address] [phone]  At [facility], please arrive at the [facility] and Children's Entrance (Entrance C2) of [facility] 30 minutes prior to test start time. You can use the FREE valet parking offered at entrance C (encouraged to control the heart rate for the test)  Proceed to the [facility] Radiology Department (first floor) to check-in and test prep.  All radiology patients and guests should use entrance C2 at [facility], accessed from [facility], even though the hospital's physical address listed is [address].    Please follow these instructions carefully (unless otherwise directed):  An IV will be required for this test and Nitroglycerin will be given.  Hold all erectile dysfunction medications at least 3 days (72 hrs) prior to test. (Ie viagra, cialis, sildenafil, tadalafil, etc)   On the Night Before the Test: Be sure to Drink plenty of water. Do not consume any caffeinated/decaffeinated beverages or chocolate 12 hours prior to your test. Do not take any antihistamines 12 hours prior to your test.  On the Day of the Test: Drink plenty of water until 1 hour prior to the test. Do not eat any food 1 hour prior to test. You may take your regular medications prior to the test.  Take metoprolol (Lopressor)  two hours prior to test. Sent to your pharmacy.  If you take Furosemide/Hydrochlorothiazide/Spironolactone, please HOLD on the morning of the test. FEMALES- please wear underwire-free bra if available, avoid dresses & tight clothing  After the Test: Drink plenty of water. After receiving IV contrast, you may experience a mild flushed feeling. This is normal. On occasion, you may experience a mild rash up to 24 hours after the test. This is not dangerous. If this occurs, you can take Benadryl 25 mg and increase your fluid intake. If you experience trouble breathing, this can be serious. If it is severe call 911 IMMEDIATELY. If it is mild, please call our office.  We will call to schedule your test 2-4 weeks out understanding that some insurance companies will need an authorization prior to the service being performed.   For more information and frequently asked questions, please visit our website : [email or web address]  For non-scheduling related questions, please contact the cardiac imaging nurse navigator should you have any questions/concerns: Cardiac Imaging Nurse Navigators Direct Office Dial: [phone]   For scheduling needs, including cancellations and rescheduling, please call [place], [phone].    Follow-Up: At [facility], you and your health needs are our priority.  As part of our continuing mission to provide you with exceptional heart care, we have created designated Provider Care Teams.  These Care Teams include your primary Cardiologist (physician) and Advanced Practice Providers (APPs -  Physician Assistants and Nurse Practitioners) who all work together to provide you with the  care you need, when you need it.  We recommend signing up for the patient portal called "MyChart".  Sign up information is provided on this After Visit Summary.  MyChart is used to connect with patients for Virtual Visits (Telemedicine).  Patients are able to view lab/test  results, encounter notes, upcoming appointments, etc.  Non-urgent messages can be sent to your provider as well.   To learn more about what you can do with MyChart, go to [email or web address].

## 2023-04-15 LAB — CBC
Hematocrit: 42.5 % (ref 37.5–51.0)
Hemoglobin: 14 g/dL (ref 13.0–17.7)
MCH: 31.8 pg (ref 26.6–33.0)
MCHC: 32.9 g/dL (ref 31.5–35.7)
MCV: 97 fL (ref 79–97)
Platelets: 171 10*3/uL (ref 150–450)
RBC: 4.4 x10E6/uL (ref 4.14–5.80)
RDW: 12 % (ref 11.6–15.4)
WBC: 5.4 10*3/uL (ref 3.4–10.8)

## 2023-04-15 LAB — BASIC METABOLIC PANEL
BUN/Creatinine Ratio: 11 (ref 10–24)
BUN: 20 mg/dL (ref 8–27)
CO2: 18 mmol/L — ABNORMAL LOW (ref 20–29)
Calcium: 8.9 mg/dL (ref 8.6–10.2)
Chloride: 110 mmol/L — ABNORMAL HIGH (ref 96–106)
Creatinine, Ser: 1.76 mg/dL — ABNORMAL HIGH (ref 0.76–1.27)
Glucose: 96 mg/dL (ref 70–99)
Potassium: 4 mmol/L (ref 3.5–5.2)
Sodium: 143 mmol/L (ref 134–144)
eGFR: 41 mL/min/{1.73_m2} — ABNORMAL LOW (ref >59)

## 2023-04-15 LAB — NMR, LIPOPROFILE
Cholesterol, Total: 167 mg/dL (ref 100–199)
HDL Particle Number: 25.8 umol/L — ABNORMAL LOW (ref >=30.5)
HDL-C: 41 mg/dL (ref >39)
LDL Particle Number: 1152 nmol/L — ABNORMAL HIGH (ref <1000)
LDL Size: 20.4 nmol — ABNORMAL LOW (ref >20.5)
LDL-C (NIH Calc): 108 mg/dL — ABNORMAL HIGH (ref 0–99)
LP-IR Score: 63 — ABNORMAL HIGH (ref <=45)
Small LDL Particle Number: 527 nmol/L (ref <=527)
Triglycerides: 98 mg/dL (ref 0–149)

## 2023-04-15 LAB — CK: Total CK: 137 U/L (ref 41–331)

## 2023-04-15 LAB — SEDIMENTATION RATE: Sed Rate: 16 mm/h (ref 0–30)

## 2023-04-22 ENCOUNTER — Telehealth: Payer: Self-pay | Admitting: Pharmacist

## 2023-04-22 NOTE — Telephone Encounter (Signed)
Called pt to follow up with muscle aches as he's been holding rosuvastatin longer. Aches in his shoulders have resolved, some aches in his thighs if he walks uphill. Didn't used to have this before starting rosuvastatin. Will call pt in another 2 weeks for symptom update, prefer he be feeling back to normal before trying another cholesterol medication.

## 2023-04-26 ENCOUNTER — Ambulatory Visit (HOSPITAL_COMMUNITY)
Admission: RE | Admit: 2023-04-26 | Discharge: 2023-04-26 | Disposition: A | Discharge status: Home / Self Care | Payer: Medicare Other | Source: Ambulatory Visit | Attending: Physician Assistant | Admitting: Physician Assistant

## 2023-04-26 DIAGNOSIS — G4730 Sleep apnea, unspecified: Secondary | ICD-10-CM | POA: Insufficient documentation

## 2023-04-26 DIAGNOSIS — I4819 Other persistent atrial fibrillation: Secondary | ICD-10-CM | POA: Diagnosis not present

## 2023-04-26 DIAGNOSIS — I10 Essential (primary) hypertension: Secondary | ICD-10-CM | POA: Insufficient documentation

## 2023-04-26 LAB — ECHOCARDIOGRAM COMPLETE
Area-P 1/2: 2.13 cm2
Calc EF: 54.7 %
S' Lateral: 3.4 cm
Single Plane A2C EF: 53.5 %
Single Plane A4C EF: 55.3 %

## 2023-04-29 ENCOUNTER — Encounter (HOSPITAL_COMMUNITY): Payer: Self-pay

## 2023-04-29 ENCOUNTER — Telehealth (HOSPITAL_COMMUNITY): Payer: Self-pay | Admitting: Emergency Medicine

## 2023-04-29 NOTE — Telephone Encounter (Signed)
Unable to leave vm Thoren Hosang RN Navigator Cardiac Imaging [facility] [phone] Office  [phone] Cell  

## 2023-05-03 ENCOUNTER — Ambulatory Visit (HOSPITAL_BASED_OUTPATIENT_CLINIC_OR_DEPARTMENT_OTHER)
Admission: RE | Admit: 2023-05-03 | Discharge: 2023-05-03 | Disposition: A | Discharge status: Home / Self Care | Payer: Medicare Other | Source: Ambulatory Visit | Attending: Internal Medicine | Admitting: Internal Medicine

## 2023-05-03 ENCOUNTER — Encounter (HOSPITAL_BASED_OUTPATIENT_CLINIC_OR_DEPARTMENT_OTHER): Payer: Self-pay

## 2023-05-03 DIAGNOSIS — I4819 Other persistent atrial fibrillation: Secondary | ICD-10-CM | POA: Diagnosis not present

## 2023-05-03 DIAGNOSIS — I10 Essential (primary) hypertension: Secondary | ICD-10-CM | POA: Diagnosis not present

## 2023-05-03 DIAGNOSIS — R072 Precordial pain: Secondary | ICD-10-CM | POA: Diagnosis not present

## 2023-05-03 DIAGNOSIS — R5382 Chronic fatigue, unspecified: Secondary | ICD-10-CM | POA: Diagnosis not present

## 2023-05-03 MED ORDER — IOHEXOL 350 MG/ML SOLN
100.0000 mL | Freq: Once | INTRAVENOUS | Status: AC | PRN
  Administered 2023-05-03: 100 mL via INTRAVENOUS

## 2023-05-03 MED ORDER — NITROGLYCERIN 0.4 MG SL SUBL
0.8000 mg | SUBLINGUAL_TABLET | Freq: Once | SUBLINGUAL | Status: AC
Start: 2023-05-03 — End: 2023-05-03
  Administered 2023-05-03: 0.8 mg via SUBLINGUAL

## 2023-05-04 ENCOUNTER — Other Ambulatory Visit: Payer: Self-pay

## 2023-05-04 ENCOUNTER — Encounter: Payer: Self-pay | Admitting: Cardiology

## 2023-05-04 ENCOUNTER — Ambulatory Visit: Payer: Medicare Other | Attending: Cardiology | Admitting: Cardiology

## 2023-05-04 ENCOUNTER — Ambulatory Visit (INDEPENDENT_AMBULATORY_CARE_PROVIDER_SITE_OTHER): Payer: Medicare Other

## 2023-05-04 VITALS — BP 130/70 | HR 70 | Ht 72.0 in | Wt 258.0 lb

## 2023-05-04 DIAGNOSIS — Z79899 Other long term (current) drug therapy: Secondary | ICD-10-CM

## 2023-05-04 DIAGNOSIS — I480 Paroxysmal atrial fibrillation: Secondary | ICD-10-CM | POA: Diagnosis not present

## 2023-05-04 DIAGNOSIS — E785 Hyperlipidemia, unspecified: Secondary | ICD-10-CM

## 2023-05-04 DIAGNOSIS — D6869 Other thrombophilia: Secondary | ICD-10-CM | POA: Diagnosis not present

## 2023-05-04 DIAGNOSIS — I82511 Chronic embolism and thrombosis of right femoral vein: Secondary | ICD-10-CM | POA: Diagnosis not present

## 2023-05-04 DIAGNOSIS — I4819 Other persistent atrial fibrillation: Secondary | ICD-10-CM | POA: Diagnosis not present

## 2023-05-04 DIAGNOSIS — I5022 Chronic systolic (congestive) heart failure: Secondary | ICD-10-CM | POA: Insufficient documentation

## 2023-05-04 MED ORDER — ROSUVASTATIN CALCIUM 10 MG PO TABS
10.0000 mg | ORAL_TABLET | Freq: Every day | ORAL | 3 refills | Status: DC
Start: 2023-05-04 — End: 2024-04-17

## 2023-05-04 MED ORDER — ROSUVASTATIN CALCIUM 10 MG PO TABS: 10.0000 mg | ORAL_TABLET | Freq: Every day | ORAL | 3 refills | Status: DC

## 2023-05-04 NOTE — Progress Notes (Unsigned)
Enrolled for [employer] to mail a ZIO XT long term holter monitor to the patients address on file.  

## 2023-05-04 NOTE — Progress Notes (Signed)
Electrophysiology Office Note:   Date:  05/04/2023  ID:  ZADEN NASCA, DOB 1951/01/03, MRN [id number]  Primary Cardiologist: Dietrich Pates, MD Electrophysiologist: Nobie Putnam, MD      History of Present Illness:   EFRAIM TEUBNER is a 72 y.o. 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 Dietrich Pates.   The patient was initially diagnosed with atrial fibrillation 11/2022 at a Hematology appointment.  He wore a cardiac monitor which showed 100% afib burden. He was started on flecainide and underwent DCCV on 01/05/23.  Echocardiogram that was performed while in atrial fibrillation showed an LVEF of 40 to 45%.  He has been on flecainide since his cardioversion.  He has no known recurrences of atrial fibrillation, although he does not have a reliable way of monitoring at this time.  He does endorse side effects which he attributes to his flecainide prior, primarily blurry vision.  Additionally he continues to have intermittent episodes of dizziness and lightheadedness as well as some exertional shortness of breath.  He is unsure if these correlate to episodes of atrial fibrillation or not.  Review of systems complete and found to be negative unless listed in HPI.   EP Information / Studies Reviewed:   EKG 12/03/22:   Echo 04/26/23:   1. Left ventricular ejection fraction, by estimation, is 55 to 60%. Left  ventricular ejection fraction by 3D volume is 57 %. The left ventricle has  normal function. The left ventricle has no regional wall motion  abnormalities. Left ventricular diastolic   parameters were normal. The average left ventricular global longitudinal  strain is -18.2 %. The global longitudinal strain is normal.   2. Right ventricular systolic function is normal. The right  ventricular  size is normal. There is normal pulmonary artery systolic pressure.   3. The mitral valve is normal in structure. Trivial mitral valve  regurgitation. No evidence of mitral stenosis.   4. The aortic valve is normal in structure. Aortic valve regurgitation is  not visualized. No aortic stenosis is present.   5. Aortic dilatation noted. There is mild dilatation of the ascending  aorta, measuring 39 mm.   6. The inferior vena cava is normal in size with greater than 50%  respiratory variability, suggesting right atrial pressure of 3 mmHg.   Coronary CTA 05/03/23: IMPRESSION: 1. Coronary calcium score of 5.85. This was 16 percentile for age and sex matched control.   2. Normal coronary origin with right dominance.   3. CAD-RADS 2. 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 Consider non-atherosclerotic causes of chest pain. Consider preventive therapy and risk factor modification   4. Plaque analysis: Total plaque volume: 48 mm3 - 4th percentile, calcified plaque volume: 3 mm3, non-calcified plaque volume: 45 mm3. Small TPV.  Risk Assessment/Calculations:    CHA2DS2-VASc Score = 3   This indicates a 3.2% annual risk of stroke. The patient's score is based upon: CHF History: 1 HTN History: 1 Diabetes History: 0 Stroke History: 0 Vascular Disease History: 0 Age Score: 1 Gender Score: 0             Physical Exam:   VS:  BP 130/70   Pulse 70   Ht 6' (1.829 m)   Wt 258 lb (117 kg)   SpO2 94%   BMI 34.99  kg/m    Wt Readings from Last 3 Encounters:  05/04/23 258 lb (117 kg)  04/14/23 257 lb 12.8 oz (116.9 kg)  04/01/23 260 lb 12.8 oz (118.3 kg)     GEN: Well nourished, well developed in no acute distress NECK: No JVD; No carotid bruits CARDIAC: Regular rate and rhythm, no murmurs, rubs, gallops RESPIRATORY:  Clear to auscultation without rales, wheezing or rhonchi  ABDOMEN: Soft, non-tender, non-distended EXTREMITIES:  No edema; No deformity    ASSESSMENT AND PLAN:   PIERSON GAINOUS is a 72 y.o. 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 Dietrich Pates.   #Persistent atrial fibrillation: His degree of symptoms are not clear at this time.  He does however have LV dysfunction in the setting of atrial fibrillation.  Given this I think a rhythm control strategy is best.  He is currently on flecainide but believes that he is having side effects with this medication.  Unfortunately he does not have a way of monitoring for atrial fibrillation at this time.  Given that he continues to have symptoms of dizziness lightheadedness and shortness of breath it is possible that he is having paroxysms of atrial fibrillation that we are not catching. -Patient would like to stay on flecainide and carvedilol for now.  We discussed transitioning to other antiarrhythmic drug options if he feels like he is intolerant to the flecainide, although these will also carry potential side effects. -Discussed treatment options today for AF including antiarrhythmic drug therapy and ablation. Discussed risks, recovery and likelihood of success with each treatment strategy. Risk, benefits, and alternatives to EP study and ablation for afib were discussed. These risks include but are not limited to stroke, bleeding, vascular damage, tamponade, perforation, damage to the esophagus, lungs, phrenic nerve and other structures, pulmonary vein stenosis, worsening renal function, coronary vasospasm and death.  Discussed potential need for repeat ablation procedures and antiarrhythmic drugs after an initial ablation. The patient understands these risk and would like some time to think about his options. -Zio monitor to assess for paroxysms of atrial fibrillation.  #Secondary  hypercoagulable state due to atrial fibrillation: -He has a CHA2DS2-VASc score of 3. -He is tolerating Xarelto without issue.  Continue Xarelto for stroke prophylaxis.  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  Post cardioversion in sinus rhythm his LVEF was 55 to 60%. -Given these findings I believe a rhythm control strategy is best. -Continue current regimen and regular follow-up with our general cardiology colleagues.  #Chronic right lower extremity DVT: Remote imaging suggest that the right common femoral vein is patent. -If patient would like to pursue catheter ablation, then it would be useful to repeat vascular imaging of the right lower extremity. -Continue Xarelto.  Follow-up with Dr. Jimmey Ralph in 3 months.   Total time of encounter: 76 minutes total time of encounter, including chart review, face-to-face patient care, coordination of care and counseling regarding high complexity medical decision making.   Signed, Nobie Putnam, MD

## 2023-05-04 NOTE — Patient Instructions (Signed)
Medication Instructions:  Your physician recommends that you continue on your current medications as directed. Please refer to the Current Medication list given to you today. *If you need a refill on your cardiac medications before your next appointment, please call your pharmacy*   Testing/Procedures: Atrial Fibrillation Ablation - contact our office if you would like to schedule this Your physician has recommended that you have an ablation. Catheter ablation is a medical procedure used to treat some cardiac arrhythmias (irregular heartbeats). During catheter ablation, a long, thin, flexible tube is put into a blood vessel in your groin (upper thigh), or neck. This tube is called an ablation catheter. It is then guided to your heart through the blood vessel. Radio frequency waves destroy small areas of heart tissue where abnormal heartbeats may cause an arrhythmia to start. Please see the instruction sheet given to you today.  Zio Cardiac Monitor Your physician has recommended that you wear an event monitor. Event monitors are medical devices that record the heart's electrical activity. Doctors most often [place] these monitors to diagnose arrhythmias. Arrhythmias are problems with the speed or rhythm of the heartbeat. The monitor is a small, portable device. You can wear one while you do your normal daily activities. This is usually used to diagnose what is causing palpitations/syncope (passing out).   Follow-Up: At [facility], you and your health needs are our priority.  As part of our continuing mission to provide you with exceptional heart care, we have created designated Provider Care Teams.  These Care Teams include your primary Cardiologist (physician) and Advanced Practice Providers (APPs -  Physician Assistants and Nurse Practitioners) who all work together to provide you with the care you need, when you need it.  We recommend signing up for the patient portal called "MyChart".  Sign  up information is provided on this After Visit Summary.  MyChart is used to connect with patients for Virtual Visits (Telemedicine).  Patients are able to view lab/test results, encounter notes, upcoming appointments, etc.  Non-urgent messages can be sent to your provider as well.   To learn more about what you can do with MyChart, go to [email or web address].    Your next appointment:   3 month(s)  Provider:   Nobie Putnam, MD  Christena Deem- Long Term Monitor Instructions  Your physician has requested you wear a ZIO patch monitor for 14 days.  This is a single patch monitor. [employer] supplies one patch monitor per enrollment. Additional stickers are not available. Please do not apply patch if you will be having a Nuclear Stress Test,  Echocardiogram, Cardiac CT, MRI, or Chest Xray during the period you would be wearing the  monitor. The patch cannot be worn during these tests. You cannot remove and re-apply the  ZIO XT patch monitor.  Your ZIO patch monitor will be mailed 3 day USPS to your address on file. It may take 3-5 days  to receive your monitor after you have been enrolled.  Once you have received your monitor, please review the enclosed instructions. Your monitor  has already been registered assigning a specific monitor serial # to you.  Billing and Patient Assistance Program Information  We have supplied [employer] with any of your insurance information on file for billing purposes. [employer] offers a sliding scale Patient Assistance Program for patients that do not have  insurance, or whose insurance does not completely cover the cost of the ZIO monitor.  You must apply for the Patient Assistance Program to qualify  for this discounted rate.  To apply, please call [employer] at [phone], select option 4, select option 2, ask to apply for  Patient Assistance Program. Meredeth Ide will ask your household income, and how many people  are in your household. They will quote your out-of-pocket  cost based on that information.  [employer] will also be able to set up a 48-month, interest-free payment plan if needed.  Applying the monitor   Shave hair from upper left chest.  Hold abrader disc by orange tab. Rub abrader in 40 strokes over the upper left chest as  indicated in your monitor instructions.  Clean area with 4 enclosed alcohol pads. Let dry.  Apply patch as indicated in monitor instructions. Patch will be placed under collarbone on left  side of chest with arrow pointing upward.  Rub patch adhesive wings for 2 minutes. Remove white label marked "1". Remove the white  label marked "2". Rub patch adhesive wings for 2 additional minutes.  While looking in a mirror, press and release button in center of patch. A small green light will  flash 3-4 times. This will be your only indicator that the monitor has been turned on.  Do not shower for the first 24 hours. You may shower after the first 24 hours.  Press the button if you feel a symptom. You will hear a small click. Record Date, Time and  Symptom in the Patient Logbook.  When you are ready to remove the patch, follow instructions on the last 2 pages of Patient  Logbook. Stick patch monitor onto the last page of Patient Logbook.  Place Patient Logbook in the blue and white box. Use locking tab on box and tape box closed  securely. The blue and white box has prepaid postage on it. Please place it in the mailbox as  soon as possible. Your physician should have your test results approximately 7 days after the  monitor has been mailed back to [facility].  Call [facility] Customer Care at [phone] if you have questions regarding  your ZIO XT patch monitor. Call them immediately if you see an orange light blinking on your  monitor.  If your monitor falls off in less than 4 days, contact our Monitor department at [phone].  If your monitor becomes loose or falls off after 4 days call [employer] at [phone] for   suggestions on securing your monitor

## 2023-05-08 DIAGNOSIS — I480 Paroxysmal atrial fibrillation: Secondary | ICD-10-CM | POA: Diagnosis not present

## 2023-05-13 ENCOUNTER — Other Ambulatory Visit: Payer: Self-pay | Admitting: Cardiology

## 2023-05-13 ENCOUNTER — Ambulatory Visit (HOSPITAL_COMMUNITY)
Admission: RE | Admit: 2023-05-13 | Discharge: 2023-05-13 | Disposition: A | Discharge status: Home / Self Care | Payer: Medicare Other | Source: Ambulatory Visit | Attending: Cardiology | Admitting: Cardiology

## 2023-05-13 DIAGNOSIS — I82501 Chronic embolism and thrombosis of unspecified deep veins of right lower extremity: Secondary | ICD-10-CM | POA: Diagnosis not present

## 2023-05-13 DIAGNOSIS — I480 Paroxysmal atrial fibrillation: Secondary | ICD-10-CM

## 2023-05-20 ENCOUNTER — Telehealth: Payer: Self-pay | Admitting: Pharmacist

## 2023-05-20 NOTE — Telephone Encounter (Addendum)
Spoke with patient. Dr. Tenny Craw has started him back on rosuvastatin 10mg . He has taken for about a week. Doing well with no muscle aches. Advised to continue and let us know if he has any issues. Labs in 2-3 months. Dr. Tenny Craw has ordered.

## 2023-05-24 DIAGNOSIS — I4819 Other persistent atrial fibrillation: Secondary | ICD-10-CM | POA: Diagnosis not present

## 2023-05-24 DIAGNOSIS — E785 Hyperlipidemia, unspecified: Secondary | ICD-10-CM | POA: Diagnosis not present

## 2023-05-24 DIAGNOSIS — Z79899 Other long term (current) drug therapy: Secondary | ICD-10-CM | POA: Diagnosis not present

## 2023-05-25 LAB — BASIC METABOLIC PANEL
BUN/Creatinine Ratio: 11 (ref 10–24)
BUN: 20 mg/dL (ref 8–27)
CO2: 23 mmol/L (ref 20–29)
Calcium: 9.3 mg/dL (ref 8.6–10.2)
Chloride: 109 mmol/L — ABNORMAL HIGH (ref 96–106)
Creatinine, Ser: 1.88 mg/dL — ABNORMAL HIGH (ref 0.76–1.27)
Glucose: 80 mg/dL (ref 70–99)
Potassium: 4.8 mmol/L (ref 3.5–5.2)
Sodium: 145 mmol/L — ABNORMAL HIGH (ref 134–144)
eGFR: 38 mL/min/{1.73_m2} — ABNORMAL LOW (ref >59)

## 2023-05-25 LAB — NMR, LIPOPROFILE
Cholesterol, Total: 134 mg/dL (ref 100–199)
HDL Particle Number: 34 umol/L (ref >=30.5)
HDL-C: 48 mg/dL (ref >39)
LDL Particle Number: 661 nmol/L (ref <1000)
LDL Size: 21.1 nm (ref >20.5)
LDL-C (NIH Calc): 60 mg/dL (ref 0–99)
LP-IR Score: 39 (ref <=45)
Small LDL Particle Number: 367 nmol/L (ref <=527)
Triglycerides: 151 mg/dL — ABNORMAL HIGH (ref 0–149)

## 2023-05-25 LAB — HEPATIC FUNCTION PANEL
ALT: 20 IU/L (ref 0–44)
AST: 22 IU/L (ref 0–40)
Albumin: 4 g/dL (ref 3.8–4.8)
Alkaline Phosphatase: 92 [IU]/L (ref 44–121)
Bilirubin Total: 0.5 mg/dL (ref 0.0–1.2)
Bilirubin, Direct: 0.19 mg/dL (ref 0.00–0.40)
Total Protein: 6.4 g/dL (ref 6.0–8.5)

## 2023-05-25 LAB — CBC
Hematocrit: 41 % (ref 37.5–51.0)
Hemoglobin: 13.8 g/dL (ref 13.0–17.7)
MCH: 32 pg (ref 26.6–33.0)
MCHC: 33.7 g/dL (ref 31.5–35.7)
MCV: 95 fL (ref 79–97)
Platelets: 180 10*3/uL (ref 150–450)
RBC: 4.31 x10E6/uL (ref 4.14–5.80)
RDW: 11.6 % (ref 11.6–15.4)
WBC: 5.8 10*3/uL (ref 3.4–10.8)

## 2023-05-26 ENCOUNTER — Encounter: Payer: Self-pay | Admitting: Internal Medicine

## 2023-05-27 DIAGNOSIS — I480 Paroxysmal atrial fibrillation: Secondary | ICD-10-CM | POA: Diagnosis not present

## 2023-05-29 ENCOUNTER — Other Ambulatory Visit: Payer: Self-pay | Admitting: Family Medicine

## 2023-05-31 ENCOUNTER — Encounter (HOSPITAL_COMMUNITY): Payer: Self-pay

## 2023-06-01 ENCOUNTER — Ambulatory Visit (HOSPITAL_COMMUNITY)
Admission: RE | Admit: 2023-06-01 | Discharge: 2023-06-01 | Disposition: A | Discharge status: Home / Self Care | Payer: Medicare Other | Source: Ambulatory Visit | Attending: Cardiology | Admitting: Cardiology

## 2023-06-01 ENCOUNTER — Encounter: Payer: Self-pay | Admitting: Cardiology

## 2023-06-01 DIAGNOSIS — I480 Paroxysmal atrial fibrillation: Secondary | ICD-10-CM

## 2023-06-01 MED ORDER — IOHEXOL 350 MG/ML SOLN
95.0000 mL | Freq: Once | INTRAVENOUS | Status: AC | PRN
  Administered 2023-06-01: 95 mL via INTRAVENOUS

## 2023-06-03 ENCOUNTER — Ambulatory Visit (HOSPITAL_COMMUNITY): Payer: Medicare Other

## 2023-06-17 ENCOUNTER — Encounter (HOSPITAL_COMMUNITY)
Admission: RE | Disposition: A | Discharge status: Home / Self Care | Payer: Self-pay | Source: Home / Self Care | Attending: Cardiology

## 2023-06-17 ENCOUNTER — Other Ambulatory Visit: Payer: Self-pay

## 2023-06-17 ENCOUNTER — Ambulatory Visit (HOSPITAL_COMMUNITY): Payer: Medicare Other | Admitting: Anesthesiology

## 2023-06-17 ENCOUNTER — Ambulatory Visit (HOSPITAL_COMMUNITY)
Admission: RE | Admit: 2023-06-17 | Discharge: 2023-06-18 | Disposition: A | Discharge status: Home / Self Care | Payer: Medicare Other | Attending: Cardiology | Admitting: Cardiology

## 2023-06-17 ENCOUNTER — Ambulatory Visit (HOSPITAL_BASED_OUTPATIENT_CLINIC_OR_DEPARTMENT_OTHER): Payer: Medicare Other | Admitting: Anesthesiology

## 2023-06-17 DIAGNOSIS — Z79899 Other long term (current) drug therapy: Secondary | ICD-10-CM | POA: Diagnosis not present

## 2023-06-17 DIAGNOSIS — F419 Anxiety disorder, unspecified: Secondary | ICD-10-CM | POA: Insufficient documentation

## 2023-06-17 DIAGNOSIS — I4891 Unspecified atrial fibrillation: Secondary | ICD-10-CM | POA: Diagnosis present

## 2023-06-17 DIAGNOSIS — N183 Chronic kidney disease, stage 3 unspecified: Secondary | ICD-10-CM | POA: Insufficient documentation

## 2023-06-17 DIAGNOSIS — I5042 Chronic combined systolic (congestive) and diastolic (congestive) heart failure: Secondary | ICD-10-CM | POA: Diagnosis not present

## 2023-06-17 DIAGNOSIS — F32A Depression, unspecified: Secondary | ICD-10-CM | POA: Diagnosis not present

## 2023-06-17 DIAGNOSIS — Z7901 Long term (current) use of anticoagulants: Secondary | ICD-10-CM | POA: Diagnosis not present

## 2023-06-17 DIAGNOSIS — D6869 Other thrombophilia: Secondary | ICD-10-CM | POA: Insufficient documentation

## 2023-06-17 DIAGNOSIS — E1122 Type 2 diabetes mellitus with diabetic chronic kidney disease: Secondary | ICD-10-CM | POA: Diagnosis not present

## 2023-06-17 DIAGNOSIS — I4819 Other persistent atrial fibrillation: Secondary | ICD-10-CM | POA: Diagnosis not present

## 2023-06-17 DIAGNOSIS — I129 Hypertensive chronic kidney disease with stage 1 through stage 4 chronic kidney disease, or unspecified chronic kidney disease: Secondary | ICD-10-CM | POA: Diagnosis not present

## 2023-06-17 DIAGNOSIS — I130 Hypertensive heart and chronic kidney disease with heart failure and stage 1 through stage 4 chronic kidney disease, or unspecified chronic kidney disease: Secondary | ICD-10-CM | POA: Insufficient documentation

## 2023-06-17 HISTORY — PX: ATRIAL FIBRILLATION ABLATION: EP1191

## 2023-06-17 LAB — POCT ACTIVATED CLOTTING TIME
Activated Clotting Time: 279 s
Activated Clotting Time: 308 s
Activated Clotting Time: 314 s

## 2023-06-17 SURGERY — ATRIAL FIBRILLATION ABLATION
Anesthesia: General

## 2023-06-17 MED ORDER — PHENYLEPHRINE 80 MCG/ML (10ML) SYRINGE FOR IV PUSH (FOR BLOOD PRESSURE SUPPORT)
PREFILLED_SYRINGE | INTRAVENOUS | Status: DC | PRN
  Administered 2023-06-17 (×5): 80 ug via INTRAVENOUS

## 2023-06-17 MED ORDER — FENTANYL CITRATE (PF) 100 MCG/2ML IJ SOLN: 25.0000 ug | INTRAMUSCULAR | Status: DC | PRN

## 2023-06-17 MED ORDER — SODIUM CHLORIDE 0.9% FLUSH
3.0000 mL | Freq: Two times a day (BID) | INTRAVENOUS | Status: DC
  Administered 2023-06-17: 3 mL via INTRAVENOUS

## 2023-06-17 MED ORDER — SUGAMMADEX SODIUM 200 MG/2ML IV SOLN
INTRAVENOUS | Status: DC | PRN
  Administered 2023-06-17: 231.4 mg via INTRAVENOUS

## 2023-06-17 MED ORDER — ACETAMINOPHEN 10 MG/ML IV SOLN: 1000.0000 mg | Freq: Once | INTRAVENOUS | Status: DC | PRN

## 2023-06-17 MED ORDER — VITAMIN D 25 MCG (1000 UNIT) PO TABS
2000.0000 [IU] | ORAL_TABLET | Freq: Every day | ORAL | Status: DC
  Administered 2023-06-17: 2000 [IU] via ORAL
  Filled 2023-06-17: qty 2

## 2023-06-17 MED ORDER — VANCOMYCIN HCL 10 G IV SOLR: INTRAVENOUS | Status: DC | PRN

## 2023-06-17 MED ORDER — HEPARIN (PORCINE) IN NACL 1000-0.9 UT/500ML-% IV SOLN
INTRAVENOUS | Status: DC | PRN
  Administered 2023-06-17 (×3): 500 mL

## 2023-06-17 MED ORDER — RIVAROXABAN 20 MG PO TABS
20.0000 mg | ORAL_TABLET | Freq: Every day | ORAL | Status: DC
  Administered 2023-06-17: 20 mg via ORAL
  Filled 2023-06-17: qty 1

## 2023-06-17 MED ORDER — TAMSULOSIN HCL 0.4 MG PO CAPS
0.4000 mg | ORAL_CAPSULE | Freq: Every day | ORAL | Status: DC
  Administered 2023-06-17: 0.4 mg via ORAL
  Filled 2023-06-17: qty 1

## 2023-06-17 MED ORDER — VANCOMYCIN HCL 1.5 G IV SOLR
1500.0000 mg | Freq: Once | INTRAVENOUS | Status: AC
  Administered 2023-06-17: 1500 mg via INTRAVENOUS
  Filled 2023-06-17: qty 30

## 2023-06-17 MED ORDER — CEFAZOLIN SODIUM-DEXTROSE 2-4 GM/100ML-% IV SOLN
INTRAVENOUS | Status: AC
  Filled 2023-06-17: qty 100

## 2023-06-17 MED ORDER — PROTAMINE SULFATE 10 MG/ML IV SOLN
INTRAVENOUS | Status: DC | PRN
  Administered 2023-06-17: 40 mg via INTRAVENOUS

## 2023-06-17 MED ORDER — BISACODYL 5 MG PO TBEC
5.0000 mg | DELAYED_RELEASE_TABLET | Freq: Every day | ORAL | Status: DC
  Filled 2023-06-17: qty 1

## 2023-06-17 MED ORDER — ALBUTEROL SULFATE (2.5 MG/3ML) 0.083% IN NEBU: 2.5000 mg | INHALATION_SOLUTION | Freq: Four times a day (QID) | RESPIRATORY_TRACT | Status: DC | PRN

## 2023-06-17 MED ORDER — SODIUM CHLORIDE 0.9 % IV SOLN: INTRAVENOUS | Status: DC

## 2023-06-17 MED ORDER — FENTANYL CITRATE (PF) 250 MCG/5ML IJ SOLN
INTRAMUSCULAR | Status: DC | PRN
  Administered 2023-06-17: 100 ug via INTRAVENOUS

## 2023-06-17 MED ORDER — AMISULPRIDE (ANTIEMETIC) 5 MG/2ML IV SOLN: 10.0000 mg | Freq: Once | INTRAVENOUS | Status: DC | PRN

## 2023-06-17 MED ORDER — ONDANSETRON HCL 4 MG/2ML IJ SOLN: 4.0000 mg | Freq: Four times a day (QID) | INTRAMUSCULAR | Status: DC | PRN

## 2023-06-17 MED ORDER — CARVEDILOL 12.5 MG PO TABS
12.5000 mg | ORAL_TABLET | Freq: Two times a day (BID) | ORAL | Status: DC
  Administered 2023-06-17 – 2023-06-18 (×2): 12.5 mg via ORAL
  Filled 2023-06-17 (×2): qty 1

## 2023-06-17 MED ORDER — SODIUM CHLORIDE 0.9 % IV SOLN: INTRAVENOUS | Status: DC | PRN

## 2023-06-17 MED ORDER — SODIUM CHLORIDE 0.9 % IV SOLN: 250.0000 mL | INTRAVENOUS | Status: DC | PRN

## 2023-06-17 MED ORDER — LIDOCAINE 2% (20 MG/ML) 5 ML SYRINGE
INTRAMUSCULAR | Status: DC | PRN
  Administered 2023-06-17: 40 mg via INTRAVENOUS

## 2023-06-17 MED ORDER — BUPROPION HCL ER (XL) 300 MG PO TB24
300.0000 mg | ORAL_TABLET | Freq: Every day | ORAL | Status: DC
Start: 2023-06-17 — End: 2023-06-18
  Administered 2023-06-17 – 2023-06-18 (×2): 300 mg via ORAL
  Filled 2023-06-17 (×2): qty 1

## 2023-06-17 MED ORDER — VANCOMYCIN HCL 1.5 G IV SOLR: 1500.0000 mg | Freq: Once | INTRAVENOUS | Status: DC

## 2023-06-17 MED ORDER — ATROPINE SULFATE 1 MG/ML IV SOLN
INTRAVENOUS | Status: DC | PRN
  Administered 2023-06-17: 1 mg via INTRAVENOUS

## 2023-06-17 MED ORDER — PROPOFOL 10 MG/ML IV BOLUS
INTRAVENOUS | Status: DC | PRN
  Administered 2023-06-17: 150 mg via INTRAVENOUS

## 2023-06-17 MED ORDER — ROSUVASTATIN CALCIUM 5 MG PO TABS
10.0000 mg | ORAL_TABLET | Freq: Every day | ORAL | Status: DC
  Administered 2023-06-17 – 2023-06-18 (×2): 10 mg via ORAL
  Filled 2023-06-17 (×2): qty 2

## 2023-06-17 MED ORDER — ACETAMINOPHEN 325 MG PO TABS
650.0000 mg | ORAL_TABLET | ORAL | Status: DC | PRN
  Administered 2023-06-17 – 2023-06-18 (×2): 650 mg via ORAL
  Filled 2023-06-17 (×2): qty 2

## 2023-06-17 MED ORDER — ONDANSETRON HCL 4 MG/2ML IJ SOLN
INTRAMUSCULAR | Status: DC | PRN
  Administered 2023-06-17: 4 mg via INTRAVENOUS

## 2023-06-17 MED ORDER — ONDANSETRON HCL 4 MG/2ML IJ SOLN: 4.0000 mg | Freq: Once | INTRAMUSCULAR | Status: DC | PRN

## 2023-06-17 MED ORDER — SODIUM CHLORIDE 0.9% FLUSH: 3.0000 mL | INTRAVENOUS | Status: DC | PRN

## 2023-06-17 MED ORDER — FLECAINIDE ACETATE 50 MG PO TABS
75.0000 mg | ORAL_TABLET | Freq: Two times a day (BID) | ORAL | Status: DC
  Administered 2023-06-17 – 2023-06-18 (×2): 75 mg via ORAL
  Filled 2023-06-17 (×2): qty 2

## 2023-06-17 MED ORDER — ROCURONIUM BROMIDE 10 MG/ML (PF) SYRINGE
PREFILLED_SYRINGE | INTRAVENOUS | Status: DC | PRN
  Administered 2023-06-17: 30 mg via INTRAVENOUS
  Administered 2023-06-17: 10 mg via INTRAVENOUS
  Administered 2023-06-17: 60 mg via INTRAVENOUS

## 2023-06-17 MED ORDER — SENNA 8.6 MG PO TABS: 1.0000 | ORAL_TABLET | Freq: Every evening | ORAL | Status: DC | PRN

## 2023-06-17 MED ORDER — HEPARIN SODIUM (PORCINE) 1000 UNIT/ML IJ SOLN
INTRAMUSCULAR | Status: DC | PRN
  Administered 2023-06-17: 17000 [IU] via INTRAVENOUS
  Administered 2023-06-17: 5000 [IU] via INTRAVENOUS
  Administered 2023-06-17: 1000 [IU] via INTRAVENOUS
  Administered 2023-06-17: 3000 [IU] via INTRAVENOUS

## 2023-06-17 MED ORDER — CLONAZEPAM 0.5 MG PO TABS
1.0000 mg | ORAL_TABLET | Freq: Two times a day (BID) | ORAL | Status: DC | PRN
Start: 2023-06-17 — End: 2023-06-18

## 2023-06-17 SURGICAL SUPPLY — 23 items
BAG SNAP BAND KOVER 36X36 (MISCELLANEOUS) IMPLANT
BLANKET WARM UNDERBOD FULL ACC (MISCELLANEOUS) ×1 IMPLANT
CABLE PFA RX CATH CONN (CABLE) IMPLANT
CATH FARAWAVE ABLATION 31 (CATHETERS) IMPLANT
CATH OCTARAY 2.0 F 3-3-3-3-3 (CATHETERS) IMPLANT
CATH SOUNDSTAR ECO 8FR (CATHETERS) IMPLANT
CATH WEB BI DIR CSDF CRV REPRO (CATHETERS) IMPLANT
CLOSURE MYNX CONTROL 6F/7F (Vascular Products) IMPLANT
CLOSURE PERCLOSE PROSTYLE (VASCULAR PRODUCTS) IMPLANT
COVER SWIFTLINK CONNECTOR (BAG) ×1 IMPLANT
DEVICE CLOSURE MYNXGRIP 6/7F (Vascular Products) IMPLANT
DILATOR VESSEL 38 20CM 16FR (INTRODUCER) IMPLANT
GUIDEWIRE INQWIRE 1.5J.035X260 (WIRE) IMPLANT
INQWIRE 1.5J .035X260CM (WIRE) ×1
KIT VERSACROSS CNCT FARADRIVE (KITS) IMPLANT
MAT PREVALON FULL STRYKER (MISCELLANEOUS) IMPLANT
PACK EP LF (CUSTOM PROCEDURE TRAY) ×1 IMPLANT
PAD DEFIB RADIO PHYSIO CONN (PAD) ×1 IMPLANT
PATCH CARTO3 (PAD) IMPLANT
SHEATH FARADRIVE STEERABLE (SHEATH) IMPLANT
SHEATH PINNACLE 8F 10CM (SHEATH) IMPLANT
SHEATH PINNACLE 9F 10CM (SHEATH) IMPLANT
SHEATH PROBE COVER 6X72 (BAG) IMPLANT

## 2023-06-17 NOTE — Transfer of Care (Signed)
Immediate Anesthesia Transfer of Care Note  Patient: Joseph Tran  Procedure(s) Performed: ATRIAL FIBRILLATION ABLATION  Patient Location: PACU  Anesthesia Type:General  Level of Consciousness: awake  Airway & Oxygen Therapy: Patient connected to face mask oxygen  Post-op Assessment: Post -op Vital signs reviewed and stable  Post vital signs: stable  Last Vitals:  Vitals Value Taken Time  BP 148/97 06/17/23 1425  Temp 37.2 C 06/17/23 1408  Pulse 74 06/17/23 1427  Resp 20 06/17/23 1427  SpO2 88 % 06/17/23 1427  Vitals shown include unfiled device data.  Last Pain:  Vitals:   06/17/23 1408  TempSrc: Temporal  PainSc:          Complications: No notable events documented.

## 2023-06-17 NOTE — Anesthesia Procedure Notes (Signed)
Procedure Name: Intubation Date/Time: 06/17/2023 11:03 AM  Performed by: Hessie Diener, CRNAPre-anesthesia Checklist: Patient identified, Emergency Drugs available, Suction available and Patient being monitored Patient Re-evaluated:Patient Re-evaluated prior to induction Oxygen Delivery Method: Circle System Utilized Preoxygenation: Pre-oxygenation with 100% oxygen Induction Type: IV induction Ventilation: Mask ventilation without difficulty Laryngoscope Size: Mac and 3 Grade View: Grade II Tube type: Oral Tube size: 7.5 mm Number of attempts: 1 Airway Equipment and Method: Stylet and Oral airway Placement Confirmation: ETT inserted through vocal cords under direct vision, positive ETCO2 and breath sounds checked- equal and bilateral Tube secured with: Tape Dental Injury: Teeth and Oropharynx as per pre-operative assessment

## 2023-06-17 NOTE — Anesthesia Preprocedure Evaluation (Addendum)
Anesthesia Evaluation  Patient identified by MRN, date of birth, ID band Patient awake    Reviewed: Allergy & Precautions, NPO status , Patient's Chart, lab work & pertinent test results  History of Anesthesia Complications (+) PONV and history of anesthetic complications  Airway Mallampati: III  TM Distance: >3 FB Neck ROM: Full    Dental no notable dental hx.    Pulmonary asthma , sleep apnea and Continuous Positive Airway Pressure Ventilation    Pulmonary exam normal        Cardiovascular hypertension, Pt. on home beta blockers and Pt. on medications + DVT  Normal cardiovascular exam     Neuro/Psych  PSYCHIATRIC DISORDERS Anxiety Depression     Neuromuscular disease    GI/Hepatic negative GI ROS, Neg liver ROS,,,  Endo/Other  negative endocrine ROS    Renal/GU Renal disease     Musculoskeletal  (+) Arthritis ,    Abdominal   Peds  Hematology  (+) Blood dyscrasia (Xarelto)   Anesthesia Other Findings A-fib  Reproductive/Obstetrics                             Anesthesia Physical Anesthesia Plan  ASA: 3  Anesthesia Plan: General   Post-op Pain Management:    Induction: Intravenous  PONV Risk Score and Plan: 3 and Ondansetron, Dexamethasone, Propofol infusion, Midazolam and Treatment may vary due to age or medical condition  Airway Management Planned: Oral ETT  Additional Equipment:   Intra-op Plan:   Post-operative Plan: Extubation in OR  Informed Consent: I have reviewed the patients History and Physical, chart, labs and discussed the procedure including the risks, benefits and alternatives for the proposed anesthesia with the patient or authorized representative who has indicated his/her understanding and acceptance.     Dental advisory given  Plan Discussed with: CRNA  Anesthesia Plan Comments:        Anesthesia Quick Evaluation

## 2023-06-17 NOTE — Progress Notes (Addendum)
Interval EP Note:   Patient underwent PVI and posterior wall ablation for persistent atrial fibrillation. Lipomatous hypertrophy of interatrial septum and left atrial anatomy made the procedure technically challenging. Procedure was successful but took longer than anticipated. Patient has small pericardial effusion at baseline and appeared unchanged at the end of the case. Due to the aforementioned factors, we elected to keep the patient overnight for extended monitoring.   Joseph Putnam, MD Cardiac Electrophysiology

## 2023-06-17 NOTE — H&P (Signed)
Electrophysiology Note:   Date:  06/17/2023  ID:  Joseph Tran, DOB 01/08/1951, MRN [id number]   Primary Cardiologist: Dietrich Pates, MD Electrophysiologist: Nobie Putnam, MD       History of Present Illness:   Joseph Tran is a 72 y.o. 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 Dietrich Pates.    The patient was initially diagnosed with atrial fibrillation 11/2022 at a Hematology appointment.  He wore a cardiac monitor which showed 100% afib burden. He was started on flecainide and underwent DCCV on 01/05/23.  Echocardiogram that was performed while in atrial fibrillation showed an LVEF of 40 to 45%.  He has been on flecainide since his cardioversion.  He has no known recurrences of atrial fibrillation, although he does not have a reliable way of monitoring at this time.  He does endorse side effects which he attributes to his flecainide prior, primarily blurry vision.  Additionally he continues to have intermittent episodes of dizziness and lightheadedness as well as some exertional shortness of breath.  He is unsure if these correlate to episodes of atrial fibrillation or not.   Interval: Doing well since clinic visit. Doppler US showed no DVT in RLE. Reports today for ablation. No new or acute complaints. No missed doses of anti-coagulation.  Review of systems complete and found to be negative unless listed in HPI.    EP Information / Studies Reviewed:   EKG 12/03/22:    Echo 04/26/23:   1. Left ventricular ejection fraction, by estimation, is 55 to 60%. Left  ventricular ejection fraction by 3D volume is 57 %. The left ventricle has  normal function. The left ventricle has no regional wall motion  abnormalities. Left ventricular diastolic   parameters were normal. The average  left ventricular global longitudinal  strain is -18.2 %. The global longitudinal strain is normal.   2. Right ventricular systolic function is normal. The right ventricular  size is normal. There is normal pulmonary artery systolic pressure.   3. The mitral valve is normal in structure. Trivial mitral valve  regurgitation. No evidence of mitral stenosis.   4. The aortic valve is normal in structure. Aortic valve regurgitation is  not visualized. No aortic stenosis is present.   5. Aortic dilatation noted. There is mild dilatation of the ascending  aorta, measuring 39 mm.   6. The inferior vena cava is normal in size with greater than 50%  respiratory variability, suggesting right atrial pressure of 3 mmHg.    Coronary CTA 05/03/23: IMPRESSION: 1. Coronary calcium score of 5.85. This was 16 percentile for age and sex matched control.   2. Normal coronary origin with right dominance.   3. CAD-RADS 2. 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 Consider non-atherosclerotic causes of chest pain. Consider preventive therapy and risk factor modification   4. Plaque analysis: Total plaque volume: 48 mm3 - 4th percentile, calcified plaque volume: 3 mm3, non-calcified plaque volume: 45 mm3. Small TPV.   Risk Assessment/Calculations:     CHA2DS2-VASc Score = 3   This indicates a 3.2% annual risk of stroke. The patient's score is based upon: CHF History: 1 HTN History: 1 Diabetes History: 0 Stroke History: 0 Vascular Disease History: 0 Age Score: 1 Gender Score: 0  Physical Exam:    Vitals:   06/17/23 0829  BP: (!) 159/98  Pulse: 64  Resp: 17  Temp: 98.3 F (36.8 C)  SpO2: 95%      GEN: Well nourished, well developed in no acute distress NECK: No JVD; No carotid bruits CARDIAC: Regular rate and rhythm, no murmurs, rubs, gallops RESPIRATORY:  Clear to auscultation without rales, wheezing or rhonchi  ABDOMEN: Soft, non-tender, non-distended EXTREMITIES:   No edema; No deformity    ASSESSMENT AND PLAN:   Joseph Tran is a 72 y.o. 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 Dietrich Pates.    #Persistent atrial fibrillation: Chosen rhythm control strategy in setting of LV systolic dysfunction. -Discussed treatment options today for AF including antiarrhythmic drug therapy and ablation. Discussed risks, recovery and likelihood of success with each treatment strategy. Risk, benefits, and alternatives to EP study and ablation for afib were discussed. These risks include but are not limited to stroke, bleeding, vascular damage, tamponade, perforation, damage to the esophagus, lungs, phrenic nerve and other structures, pulmonary vein stenosis, worsening renal function, coronary vasospasm and death.  Discussed potential need for repeat ablation procedures and antiarrhythmic drugs after an initial ablation. The patient understands these risks and would like to proceed with ablation today.   #Secondary hypercoagulable state due to atrial fibrillation: -He has a CHA2DS2-VASc score of 3. -He is tolerating Xarelto without issue.  No missed doses.   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  Post cardioversion in sinus rhythm his LVEF was 55 to 60%. -Given these findings I believe a rhythm control strategy is best. -Continue current regimen and regular follow-up with our general cardiology colleagues.   Signed, Nobie Putnam, MD

## 2023-06-17 NOTE — Anesthesia Postprocedure Evaluation (Signed)
Anesthesia Post Note  Patient: Joseph Tran  Procedure(s) Performed: ATRIAL FIBRILLATION ABLATION     Patient location during evaluation: Cath Lab Anesthesia Type: General Level of consciousness: awake Pain management: pain level controlled Vital Signs Assessment: post-procedure vital signs reviewed and stable Respiratory status: spontaneous breathing, nonlabored ventilation and respiratory function stable Cardiovascular status: blood pressure returned to baseline and stable Postop Assessment: no apparent nausea or vomiting Anesthetic complications: no   No notable events documented.  Last Vitals:  Vitals:   06/17/23 1443 06/17/23 1530  BP:  (!) 160/99  Pulse: 72 70  Resp: 19 15  Temp: 36.8 C (!) 36.4 C  SpO2: (!) 88% 90%    Last Pain:  Vitals:   06/17/23 1530  TempSrc: Oral  PainSc: 0-No pain                 Idan Prime P Chade Pitner

## 2023-06-17 NOTE — Plan of Care (Signed)
  Problem: Education: Goal: Knowledge of General Education information will improve Description: Including pain rating scale, medication(s)/side effects and non-pharmacologic comfort measures Outcome: Progressing   Problem: Health Behavior/Discharge Planning: Goal: Ability to manage health-related needs will improve Outcome: Progressing   Problem: Clinical Measurements: Goal: Ability to maintain clinical measurements within normal limits will improve Outcome: Progressing Goal: Will remain free from infection Outcome: Progressing Goal: Diagnostic test results will improve Outcome: Progressing Goal: Respiratory complications will improve Outcome: Progressing Goal: Cardiovascular complication will be avoided Outcome: Progressing   Problem: Activity: Goal: Risk for activity intolerance will decrease Outcome: Progressing   Problem: Nutrition: Goal: Adequate nutrition will be maintained Outcome: Progressing   Problem: Coping: Goal: Level of anxiety will decrease Outcome: Progressing   Problem: Elimination: Goal: Will not experience complications related to bowel motility Outcome: Progressing Goal: Will not experience complications related to urinary retention Outcome: Progressing   Problem: Pain Management: Goal: General experience of comfort will improve Outcome: Progressing   Problem: Safety: Goal: Ability to remain free from injury will improve Outcome: Progressing   Problem: Skin Integrity: Goal: Risk for impaired skin integrity will decrease Outcome: Progressing   Problem: Education: Goal: Understanding of disease, treatment, and recovery process will improve Outcome: Progressing   Problem: Activity: Goal: Ability to return to baseline activity level will improve Outcome: Progressing   Problem: Cardiac: Goal: Ability to maintain adequate cardiovascular perfusion will improve Outcome: Progressing Goal: Vascular access site(s) Level 0-1 will be  maintained Outcome: Progressing   Problem: Health Behavior/ Discharge Planning: Goal: Ability to safely manage health related needs after discharge Outcome: Progressing

## 2023-06-18 ENCOUNTER — Encounter (HOSPITAL_COMMUNITY): Payer: Self-pay | Admitting: Cardiology

## 2023-06-18 DIAGNOSIS — I130 Hypertensive heart and chronic kidney disease with heart failure and stage 1 through stage 4 chronic kidney disease, or unspecified chronic kidney disease: Secondary | ICD-10-CM | POA: Diagnosis not present

## 2023-06-18 DIAGNOSIS — Z7901 Long term (current) use of anticoagulants: Secondary | ICD-10-CM | POA: Diagnosis not present

## 2023-06-18 DIAGNOSIS — N183 Chronic kidney disease, stage 3 unspecified: Secondary | ICD-10-CM | POA: Diagnosis not present

## 2023-06-18 DIAGNOSIS — I5042 Chronic combined systolic (congestive) and diastolic (congestive) heart failure: Secondary | ICD-10-CM | POA: Diagnosis not present

## 2023-06-18 DIAGNOSIS — E1122 Type 2 diabetes mellitus with diabetic chronic kidney disease: Secondary | ICD-10-CM | POA: Diagnosis not present

## 2023-06-18 DIAGNOSIS — I4819 Other persistent atrial fibrillation: Secondary | ICD-10-CM | POA: Diagnosis not present

## 2023-06-18 NOTE — Plan of Care (Signed)
  Problem: Education: Goal: Knowledge of General Education information will improve Description: Including pain rating scale, medication(s)/side effects and non-pharmacologic comfort measures Outcome: Progressing   Problem: Health Behavior/Discharge Planning: Goal: Ability to manage health-related needs will improve Outcome: Progressing   Problem: Clinical Measurements: Goal: Ability to maintain clinical measurements within normal limits will improve Outcome: Progressing Goal: Will remain free from infection Outcome: Progressing Goal: Diagnostic test results will improve Outcome: Progressing Goal: Respiratory complications will improve Outcome: Progressing Goal: Cardiovascular complication will be avoided Outcome: Progressing   Problem: Activity: Goal: Risk for activity intolerance will decrease Outcome: Progressing   Problem: Nutrition: Goal: Adequate nutrition will be maintained Outcome: Progressing   Problem: Coping: Goal: Level of anxiety will decrease Outcome: Progressing   Problem: Elimination: Goal: Will not experience complications related to bowel motility Outcome: Progressing Goal: Will not experience complications related to urinary retention Outcome: Progressing   Problem: Pain Management: Goal: General experience of comfort will improve Outcome: Progressing   Problem: Safety: Goal: Ability to remain free from injury will improve Outcome: Progressing   Problem: Skin Integrity: Goal: Risk for impaired skin integrity will decrease Outcome: Progressing   Problem: Education: Goal: Understanding of disease, treatment, and recovery process will improve Outcome: Progressing   Problem: Activity: Goal: Ability to return to baseline activity level will improve Outcome: Progressing   Problem: Cardiac: Goal: Ability to maintain adequate cardiovascular perfusion will improve Outcome: Progressing Goal: Vascular access site(s) Level 0-1 will be  maintained Outcome: Progressing   Problem: Health Behavior/ Discharge Planning: Goal: Ability to safely manage health related needs after discharge Outcome: Progressing

## 2023-06-18 NOTE — Discharge Summary (Signed)
Discharge Summary    Patient ID: Joseph Tran MRN: [id number]; DOB: 1951/02/17  Admit date: 06/17/2023 Discharge date: 06/18/2023  PCP:  Nelwyn Salisbury, MD   [facility] Providers Cardiologist:  Dietrich Pates, MD  Electrophysiologist:  Nobie Putnam, MD       Discharge Diagnoses    Principal Problem:   Atrial fibrillation [facility])  Diagnostic Studies/Procedures    EP study 06/17/2023 CONCLUSIONS: 1. Successful PVI 2. Successful ablation/isolation of the posterior wall 3. Intracardiac echo reveals normal LV size and function and small pericardial effusion, largest posterior to LV base.  4. No early apparent complications. _____________   History of Present Illness     Joseph Tran is a 72 y.o. 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  He was diagnosed with atrial fibrillation in May 2024 at hematology appointment.  He was started on flecainide and underwent cardioversion in July 2024.  EF was reduced at 40-45 while in atrial fibrillation.  Follow-up echo in October 2024 demonstrated improved LV function with EF 55-60.  He was evaluated by Dr. Jimmey Ralph with the EP and set up for PVI ablation.  Hospital Course     Consultants: None  The patient presented on 06/17/2023 to [facility] and underwent PVI and posterior wall ablation for persistent atrial fibrillation.  Procedure was technically challenging due to lipomatous hypertrophy of interatrial septum.  The procedure took longer than planned.  He had a small pericardial effusion at baseline.  This appeared unchanged at the end of the case.  He was kept overnight for observation.  He was evaluated this morning by Dr. Lalla Brothers.  He remained in sinus rhythm.  He was felt to be stable for discharge to home.    Did the patient have an acute coronary syndrome (MI, NSTEMI, STEMI, etc) this  admission?:  No                               Did the patient have a percutaneous coronary intervention (stent / angioplasty)?:  No.      _____________  Discharge Vitals Blood pressure 118/81, pulse 90, temperature 98.3 F (36.8 C), temperature source Oral, resp. rate 17, height 6' (1.829 m), weight 115.7 kg, SpO2 97%.  Filed Weights   06/17/23 0829  Weight: 115.7 kg    Labs & Radiologic Studies    _____________   Disposition   Pt is being discharged home today in good condition.  Follow-up Plans & Appointments     Follow-up Information     Eustace Pen, PA-C Follow up on 07/15/2023.   Specialty: Cardiology Why: Atrial Fibrillation Clinic Appointment is at 11:30 (please arrive 15 minutes early) Contact information: [address] [place] [address] [phone]         Nobie Putnam, MD Follow up on 08/08/2023.   Specialties: Cardiology, Radiology Why: Appointment is at 9:45 AM (please arrive 15 minutes early) Contact information: [address] [place] [address] [phone]                Discharge Instructions     Diet - low sodium heart healthy   Complete by: As directed    Discharge instructions   Complete by: As directed    You may shower today. Do not miss any doses of Xarelto.  Discharge wound care:   Complete by: As directed    Call for any swelling, bleeding, bruising or fever.   Increase activity slowly   Complete by: As directed    Lifting restrictions   Complete by: As directed    No heavy lifting for the next 4 to 5 days.        Discharge Medications   Allergies as of 06/18/2023       Reactions   Allopurinol Other (See Comments)   PATIENT PREFERENCE Pt refused due to mom developing steven johnson syndrome.   Penicillins Hives   Sulfamethoxazole Hives   Sulfonamide Derivatives Hives   Dilaudid [hydromorphone Hcl] Nausea And Vomiting        Medication List     TAKE these medications     acetaminophen 500 MG tablet Commonly known as: TYLENOL Take 1,000 mg by mouth every 8 (eight) hours as needed for moderate pain or mild pain.   albuterol 108 (90 Base) MCG/ACT inhaler Commonly known as: ProAir HFA INHALE 2 PUFFS EVERY 4  HOURS AS NEEDED FOR  WHEEZING OR SHORTNESS OF  BREATH   Apple Cider Vinegar 600 MG Caps Take 600 mg by mouth at bedtime.   b complex vitamins capsule Take 1 capsule by mouth at bedtime. With vit B12   bisacodyl 5 MG EC tablet Commonly known as: DULCOLAX Take 5 mg by mouth at bedtime.   buPROPion 300 MG 24 hr tablet Commonly known as: WELLBUTRIN XL TAKE 1 TABLET BY MOUTH DAILY   carvedilol 12.5 MG tablet Commonly known as: COREG Take 1 tablet (12.5 mg total) by mouth 2 (two) times daily.   Cholecalciferol 50 MCG (2000 UT) Tabs Take 2,000 Units by mouth at bedtime.   clindamycin 300 MG capsule Commonly known as: CLEOCIN Take 600 mg by mouth See admin instructions. Take 600 mg 1 hour prior to dental work   clonazePAM 1 MG tablet Commonly known as: KLONOPIN Take 1 tablet (1 mg total) by mouth 2 (two) times daily as needed for anxiety. for anxiety   desvenlafaxine 50 MG 24 hr tablet Commonly known as: PRISTIQ TAKE 1 TABLET BY MOUTH DAILY What changed: when to take this   flecainide 50 MG tablet Commonly known as: TAMBOCOR Take 1.5 tablets (75 mg total) by mouth 2 (two) times daily.   furosemide 20 MG tablet Commonly known as: LASIX TAKE 1 TABLET BY MOUTH  DAILY AS NEEDED   GENTEAL MILD OP Place 1 drop into both eyes daily.   hydrALAZINE 50 MG tablet Commonly known as: APRESOLINE Take 1 tablet (50 mg total) by mouth 3 (three) times daily.   rosuvastatin 10 MG tablet Commonly known as: CRESTOR Take 1 tablet (10 mg total) by mouth daily.   senna 8.6 MG Tabs tablet Commonly known as: SENOKOT Take 1 tablet by mouth at bedtime as needed for mild constipation. Vegetable   tamsulosin 0.4 MG Caps capsule Commonly known as:  FLOMAX TAKE 1 CAPSULE BY MOUTH DAILY   Xarelto 20 MG Tabs tablet Generic drug: rivaroxaban TAKE 1 TABLET BY MOUTH DAILY  WITH SUPPER               Discharge Care Instructions  (From admission, onward)           Start     Ordered   06/18/23 0000  Discharge wound care:       Comments: Call for any swelling, bleeding, bruising or fever.   06/18/23 1020  Outstanding Labs/Studies    Duration of Discharge Encounter   Greater than 30 minutes including physician time.  Signed, Tereso Newcomer, PA-C 06/18/2023, 10:24 AM

## 2023-06-18 NOTE — Progress Notes (Signed)
Patient discharged to home, AVS reviewed and patient verbalized understanding. IV removed, site clean, dry, and intact.

## 2023-06-18 NOTE — Discharge Instructions (Signed)
No heavy lifting for the next 4 to 5 days You MAY shower today Please do not miss any doses of your blood thinner - Rivaroxaban (Xarelto)

## 2023-06-18 NOTE — Progress Notes (Signed)
   Rounding Note    Patient Name: Joseph Tran Date of Encounter: 06/18/2023  [facility] Cardiologist: Dietrich Pates, MD   Subjective   NAEO.   Vital Signs    Vitals:   06/17/23 1943 06/17/23 2322 06/18/23 0425 06/18/23 0813  BP: 118/74 113/76 118/75 118/81  Pulse: 91 83 75 90  Resp: 18 20 20 17   Temp: 98 F (36.7 C) 98.3 F (36.8 C) 98.1 F (36.7 C) 98.3 F (36.8 C)  TempSrc: Oral Oral Oral Oral  SpO2: 97%  97% 97%  Weight:      Height:        Intake/Output Summary (Last 24 hours) at 06/18/2023 0857 Last data filed at 06/18/2023 5284 Gross per 24 hour  Intake 1800 ml  Output 1295 ml  Net 505 ml      06/17/2023    8:29 AM 05/04/2023    2:27 PM 04/14/2023   11:36 AM  Last 3 Weights  Weight (lbs) 255 lb 258 lb 257 lb 12.8 oz  Weight (kg) 115.667 kg 117.028 kg 116.937 kg      Telemetry    Personally Reviewed  ECG    Personally Reviewed  Physical Exam    GEN: No acute distress.   Cardiac: RRR, no murmurs, rubs, or gallops. Groin sites without hematoma or significant pain. Respiratory: Clear to auscultation bilaterally. Psych: Normal affect   Assessment & Plan    #Persistent AF #s/p PVI Doing well after PVI yesterday. No CP this AM. Remains in sinus rhythm. Cont OAC  #Chronic systolic heart failure NYHA II this AM. Warm and dry on exam.  OK to discharge.    Sheria Lang T. Lalla Brothers, MD, [facility], [facility] Cardiac Electrophysiology

## 2023-06-20 ENCOUNTER — Encounter (HOSPITAL_COMMUNITY): Payer: Self-pay | Admitting: Cardiology

## 2023-07-14 ENCOUNTER — Other Ambulatory Visit: Payer: Self-pay

## 2023-07-14 ENCOUNTER — Ambulatory Visit (HOSPITAL_COMMUNITY)
Admission: RE | Admit: 2023-07-14 | Discharge: 2023-07-14 | Disposition: A | Discharge status: Home / Self Care | Payer: Medicare Other | Source: Ambulatory Visit | Attending: Internal Medicine | Admitting: Internal Medicine

## 2023-07-14 VITALS — BP 140/76 | HR 65 | Ht 72.0 in | Wt 264.8 lb

## 2023-07-14 DIAGNOSIS — Z7901 Long term (current) use of anticoagulants: Secondary | ICD-10-CM | POA: Insufficient documentation

## 2023-07-14 DIAGNOSIS — I4819 Other persistent atrial fibrillation: Secondary | ICD-10-CM

## 2023-07-14 DIAGNOSIS — G4733 Obstructive sleep apnea (adult) (pediatric): Secondary | ICD-10-CM | POA: Insufficient documentation

## 2023-07-14 DIAGNOSIS — Z79899 Other long term (current) drug therapy: Secondary | ICD-10-CM

## 2023-07-14 DIAGNOSIS — I5020 Unspecified systolic (congestive) heart failure: Secondary | ICD-10-CM | POA: Insufficient documentation

## 2023-07-14 DIAGNOSIS — I130 Hypertensive heart and chronic kidney disease with heart failure and stage 1 through stage 4 chronic kidney disease, or unspecified chronic kidney disease: Secondary | ICD-10-CM | POA: Diagnosis not present

## 2023-07-14 DIAGNOSIS — I4891 Unspecified atrial fibrillation: Secondary | ICD-10-CM | POA: Diagnosis not present

## 2023-07-14 DIAGNOSIS — D6869 Other thrombophilia: Secondary | ICD-10-CM | POA: Diagnosis not present

## 2023-07-14 DIAGNOSIS — N189 Chronic kidney disease, unspecified: Secondary | ICD-10-CM | POA: Insufficient documentation

## 2023-07-14 DIAGNOSIS — Z5181 Encounter for therapeutic drug level monitoring: Secondary | ICD-10-CM | POA: Diagnosis not present

## 2023-07-14 NOTE — Progress Notes (Signed)
 Primary Care Physician: Johnny Garnette LABOR, MD Primary Cardiologist: Vina Gull, MD Electrophysiologist: Fonda Kitty, MD  Referring Physician: Dr Gull Lyndy FORBES Joseph Tran is a 73 y.o. male with a history of HFrEF, HTN, CKD, OSA, DVT, atrial fibrillation who presents for follow up in the [facility].  The patient was initially diagnosed with atrial fibrillation 11/2022 at a hematology appointment.  He wore a cardiac monitor which showed 100% afib burden. He was started on flecainide  and underwent DCCV on 01/05/23. Patient is on Xarelto  for a CHADS2VASC score of 3.  He was seen at the HTN clinic 03/25/23 and described blurry vision for the past few months, possibly related to flecainide . He is referred to the AF clinic to discuss alternate rhythm strategies.   On follow up today, patient remains in SR. Since around the time he started flecainide , he has had some vision blurring and intermittent dizziness, not severe but irritating to him. No falls or presyncope. No bleeding issues on anticoagulation.   On follow up 07/14/23, patient is currently in NSR. S/p Afib ablation on 06/17/23 by Dr. Kitty. No episodes of Afib since ablation. He is taking flecainide  75 mg BID. He feels tired still which is noted prior to ablation. No chest pain or SOB. Leg sites healed without issue. No missed doses of Xarelto  20 mg daily.  Today, he denies symptoms of orthopnea, PND, lower extremity edema, dizziness, presyncope, syncope, snoring, daytime somnolence, bleeding, or neurologic sequela. The patient is tolerating medications without difficulties and is otherwise without complaint today.    Atrial Fibrillation Risk Factors:  he does have symptoms or diagnosis of sleep apnea. he is compliant with CPAP therapy. he does not have a history of rheumatic fever.   Atrial Fibrillation Management history:  Previous antiarrhythmic drugs: flecainide  Previous cardioversions: 01/05/23 Previous  ablations: 06/17/23 Anticoagulation history: Xarelto   ROS- All systems are reviewed and negative except as per the HPI above.  Past Medical History:  Diagnosis Date   Acoustic neuroma (HCC)    benign - left   Anxiety    Arthritis    Asthma    seasonal, when pollen is high, cold air closes me up   Cancer [facility])    skin cancer -- arm, scalp, upper back   Chronic kidney disease    sees Dr. 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  right kidny is at 50%   Clotting disorder (HCC)    Hx DVT - Xarelto    Colon polyps    Complication of anesthesia    Post op nausea/vomiting   Depression    severe.  dx 1985   DVT (deep venous thrombosis) (HCC)    sees Dr. Maude Crease    History of kidney stones    has kidney stone now.     Hypertension    Hypogonadism male    sees Dr. Redell Napoleon at [facility]   Neuropathy of both feet    OSA (obstructive sleep apnea)    Plantar fasciitis    rt foot   Pneumonia    PONV (postoperative nausea and vomiting)    Renal atrophy, left    sees Dr. Redell Napoleon at [facility]   Sleep apnea    tested 2010  - wears c-pap    Current Outpatient Medications  Medication Sig Dispense Refill   acetaminophen  (TYLENOL ) 500 MG tablet Take 1,000 mg by mouth as needed for moderate pain (pain score 4-6)  or mild pain (pain score 1-3).     albuterol  (PROAIR  HFA) 108 (90 Base) MCG/ACT inhaler INHALE 2 PUFFS EVERY 4  HOURS AS NEEDED FOR  WHEEZING OR SHORTNESS OF  BREATH 51 g 3   Apple Cider Vinegar 600 MG CAPS Take 600 mg by mouth at bedtime.     b complex vitamins capsule Take 1 capsule by mouth at bedtime. With vit B12     bisacodyl  (DULCOLAX) 5 MG EC tablet Take 5 mg by mouth at bedtime.     buPROPion  (WELLBUTRIN  XL) 300 MG 24 hr tablet TAKE 1 TABLET BY MOUTH DAILY 90 tablet 3   carvedilol  (COREG ) 12.5 MG tablet Take 1 tablet (12.5 mg total) by mouth 2 (two) times daily. 60 tablet 5   Cholecalciferol  50 MCG (2000 UT) TABS Take  2,000 Units by mouth at bedtime.     clindamycin  (CLEOCIN ) 300 MG capsule Take 600 mg by mouth See admin instructions. Take 600 mg 1 hour prior to dental work     clonazePAM  (KLONOPIN ) 1 MG tablet Take 1 tablet (1 mg total) by mouth 2 (two) times daily as needed for anxiety. for anxiety 180 tablet 1   desvenlafaxine  (PRISTIQ ) 50 MG 24 hr tablet TAKE 1 TABLET BY MOUTH DAILY (Patient taking differently: Take 50 mg by mouth at bedtime.) 90 tablet 3   flecainide  (TAMBOCOR ) 50 MG tablet Take 1.5 tablets (75 mg total) by mouth 2 (two) times daily. 270 tablet 3   furosemide  (LASIX ) 20 MG tablet TAKE 1 TABLET BY MOUTH  DAILY AS NEEDED 90 tablet 3   hydrALAZINE  (APRESOLINE ) 50 MG tablet Take 1 tablet (50 mg total) by mouth 3 (three) times daily. 270 tablet 3   Hypromellose (GENTEAL MILD OP) Place 1 drop into both eyes daily.     rosuvastatin  (CRESTOR ) 10 MG tablet Take 1 tablet (10 mg total) by mouth daily. 90 tablet 3   senna (SENOKOT) 8.6 MG TABS tablet Take 1 tablet by mouth at bedtime as needed for mild constipation. Vegetable     tamsulosin  (FLOMAX ) 0.4 MG CAPS capsule TAKE 1 CAPSULE BY MOUTH DAILY 90 capsule 1   XARELTO  20 MG TABS tablet TAKE 1 TABLET BY MOUTH DAILY  WITH SUPPER 90 tablet 3   No current facility-administered medications for this encounter.    Physical Exam: BP (!) 140/76   Pulse 65   Ht 6' (1.829 m)   Wt 120.1 kg   BMI 35.91 kg/m   GEN- The patient is well appearing, alert and oriented x 3 today.   Neck - no JVD or carotid bruit noted Lungs- Clear to ausculation bilaterally, normal work of breathing Heart- Regular rate and rhythm, no murmurs, rubs or gallops, PMI not laterally displaced Extremities- no clubbing, cyanosis, or edema Skin - no rash or ecchymosis noted   Wt Readings from Last 3 Encounters:  07/14/23 120.1 kg  06/17/23 115.7 kg  05/04/23 117 kg     EKG today demonstrates  Vent. rate 65 BPM PR interval 178 ms QRS duration 94 ms QT/QTcB 412/428  ms P-R-T axes 78 -10 59 Normal sinus rhythm with sinus arrhythmia Normal ECG When compared with ECG of 01-Apr-2023 09:27, PREVIOUS ECG IS PRESENT   Echo 04/26/23 demonstrated   1. Left ventricular ejection fraction, by estimation, is 55 to 60%. Left  ventricular ejection fraction by 3D volume is 57 %. The left ventricle has  normal function. The left ventricle has no regional wall motion  abnormalities. Left ventricular  diastolic   parameters were normal. The average left ventricular global longitudinal  strain is -18.2 %. The global longitudinal strain is normal.   2. Right ventricular systolic function is normal. The right ventricular  size is normal. There is normal pulmonary artery systolic pressure.   3. The mitral valve is normal in structure. Trivial mitral valve  regurgitation. No evidence of mitral stenosis.   4. The aortic valve is normal in structure. Aortic valve regurgitation is  not visualized. No aortic stenosis is present.   5. Aortic dilatation noted. There is mild dilatation of the ascending  aorta, measuring 39 mm.   6. The inferior vena cava is normal in size with greater than 50%  respiratory variability, suggesting right atrial pressure of 3 mmHg.   Comparison(s): A prior study was performed on 12/23/2022. LVEF improved  from 40-45% to 55-60%, moderate LAE is now normal, moderate MR is now  trivial.   CHA2DS2-VASc Score = 3  The patient's score is based upon: CHF History: 1 HTN History: 1 Diabetes History: 0 Stroke History: 0 Vascular Disease History: 0 Age Score: 1 Gender Score: 0       ASSESSMENT AND PLAN: Persistent Atrial Fibrillation (ICD10:  I48.19) The patient's CHA2DS2-VASc score is 3, indicating a 3.2% annual risk of stroke.   S/p Afib ablation on 06/17/23 by Dr. Kennyth.  He is currently in NSR. Intervals are stable. Continue flecainide  75 mg BID. Continue carvedilol  12.5 mg BID Continue Xarelto  20 mg daily  Secondary Hypercoagulable  State (ICD10:  D68.69) The patient is at significant risk for stroke/thromboembolism based upon his CHA2DS2-VASc Score of 3.  Continue Rivaroxaban  (Xarelto ).   HTN Advised patient to trend at home.   HFmrEF EF has now normalized as of 04/26/23 echo. Fluid status appears stable   OSA  Encouraged nightly CPAP The importance of adequate treatment of sleep apnea was discussed today in order to improve our ability to maintain sinus rhythm long term.    Follow up as scheduled with EP.       Dorn Heinrich, PA-C Afib Clinic [facility] [address] [place], [place] [address] [phone]

## 2023-07-15 ENCOUNTER — Ambulatory Visit (HOSPITAL_COMMUNITY): Payer: Medicare Other | Admitting: Internal Medicine

## 2023-07-18 ENCOUNTER — Ambulatory Visit: Payer: Medicare Other | Admitting: Nurse Practitioner

## 2023-07-18 ENCOUNTER — Encounter: Payer: Self-pay | Admitting: Nurse Practitioner

## 2023-07-18 VITALS — BP 118/76 | HR 68 | Resp 14 | Ht 72.0 in | Wt 267.6 lb

## 2023-07-18 DIAGNOSIS — J4520 Mild intermittent asthma, uncomplicated: Secondary | ICD-10-CM

## 2023-07-18 DIAGNOSIS — G4733 Obstructive sleep apnea (adult) (pediatric): Secondary | ICD-10-CM

## 2023-07-18 DIAGNOSIS — I480 Paroxysmal atrial fibrillation: Secondary | ICD-10-CM | POA: Diagnosis not present

## 2023-07-18 NOTE — Assessment & Plan Note (Signed)
 OSA on CPAP. Excellent compliance and control. Receives benefit from use. Aware of proper care/use of device. Understands risks of untreated OSA. Safe driving practices reviewed. Healthy weight loss encouraged.  Patient Instructions  Continue Albuterol  inhaler 2 puffs every 6 hours as needed for shortness of breath or wheezing. Notify if symptoms persist despite rescue inhaler/neb use.  Continue to use CPAP every night, minimum of 4-6 hours a night.  Change equipment as directed. Wash your tubing with warm soap and water  daily, hang to dry. Wash humidifier portion weekly. Use bottled, distilled water  and change daily Be aware of reduced alertness and do not drive or operate heavy machinery if experiencing this or drowsiness.  Exercise encouraged, as tolerated. Healthy weight management discussed.  Avoid or decrease alcohol consumption and medications that make you more sleepy, if possible. Notify if persistent daytime sleepiness occurs even with consistent use of PAP therapy.   Follow up in one year with Dr. Neda or sooner if needed

## 2023-07-18 NOTE — Patient Instructions (Signed)
 Continue Albuterol  inhaler 2 puffs every 6 hours as needed for shortness of breath or wheezing. Notify if symptoms persist despite rescue inhaler/neb use.  Continue to use CPAP every night, minimum of 4-6 hours a night.  Change equipment as directed. Wash your tubing with warm soap and water  daily, hang to dry. Wash humidifier portion weekly. Use bottled, distilled water  and change daily Be aware of reduced alertness and do not drive or operate heavy machinery if experiencing this or drowsiness.  Exercise encouraged, as tolerated. Healthy weight management discussed.  Avoid or decrease alcohol consumption and medications that make you more sleepy, if possible. Notify if persistent daytime sleepiness occurs even with consistent use of PAP therapy.   Follow up in one year with Dr. Neda or sooner if needed

## 2023-07-18 NOTE — Assessment & Plan Note (Signed)
 No recent exacerbations. Infrequent use of SABA. Compensated on current regimen. Action plan in place.

## 2023-07-18 NOTE — Progress Notes (Signed)
 @Patient  ID: Joseph Tran Hint, male    DOB: 1950/11/10, 73 y.o.   MRN: [id number]  Chief Complaint  Patient presents with   Follow-up    CPAP-doing well with it    Referring provider: Johnny Garnette LABOR, MD  HPI: 73 year old male, never smoker followed for OSA on CPAP and asthma. He is a patient of Dr. Cathye and last seen in office 04/26/2022 by [facility] NP. 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  TEST/EVENTS:  11/09/2019 HST: AHI 31.8/h, SpO2 low 84%  04/26/2022: OV with Gaelyn Tukes NP for surgical clearance prior to left knee arthroplasty and one year follow up. He has been doing well since he was seen last. Breathing has been stable. He is not on any maintenance inhalers. 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 Otherwise, he doesn't have to use it. He denies any cough, chest congestion or wheeze. He wears his CPAP nightly without fail. He wakes feeling rested. Denies any drowsy driving or morning headaches.  03/26/2022-04/24/2022 CPAP 5-20 cmH2O  30/30 days; 100% >4 hr; av use 7 hr 17 min Pressure median 8.4, 95th 12 Leaks median 2, 95th 21.3 AHI 1.8  07/18/2023: Today - follow up Patient presents today for follow up. Doing well since our last visit from a sleep apnea standpoint. He sleeps with his CPAP nightly. No issues with leaks or mask fit. Feels like he sleeps better with his CPAP. No drowsy driving. He has had some issues with his energy levels but this is due to medications for his a fib. He had a cardioversion and then ablation late 2024. He's been on a few different regimens but they just seem to make him feel like his stamina is worse. No issues with dizziness/lightheadedness, vision changes, palpitations, chest pain.  Regarding his asthma, he has not had any exacerbations requiring steroids or abx. Rarely uses his albuterol . He feels  like his breathing is doing well. Does have some chest tightness with the cold weather. No cough or wheezing.   06/17/2023-07/16/2023: CPAP 5-20 cmH2O 29/30 days; 97% > 4hr; average use 9 hr 11 min Pressure 95th 11.2 Leaks 95th 13 AHI 2.9  Allergies  Allergen Reactions   Allopurinol Other (See Comments)    PATIENT PREFERENCE Pt refused due to mom developing steven johnson syndrome.   Penicillins Hives   Sulfamethoxazole Hives   Sulfonamide Derivatives Hives   Dilaudid  [Hydromorphone  Hcl] Nausea And Vomiting    Immunization History  Administered Date(s) Administered   Fluad Quad(high Dose 65+) 03/24/2019, 03/12/2023   Influenza Split 04/02/2011, 03/16/2012   Influenza, High Dose Seasonal PF 03/25/2017, 04/11/2018   Influenza,inj,Quad PF,6+ Mos 03/27/2013, 03/25/2014, 04/22/2015, 04/28/2016   Influenza-Unspecified 04/30/2020, 03/19/2021, 03/05/2022   PFIZER Comirnaty(Gray Top)Covid-19 Tri-Sucrose Vaccine 11/03/2020   PFIZER(Purple Top)SARS-COV-2 Vaccination 08/19/2019, 09/11/2019, 05/09/2020   PNEUMOCOCCAL CONJUGATE-20 03/12/2023   Pfizer Covid-19 Vaccine Bivalent Booster 65yrs & up 03/31/2021   Pfizer(Comirnaty)Fall Seasonal Vaccine 12 years and older 04/13/2022, 03/12/2023   Pneumococcal Conjugate-13 04/28/2016   Pneumococcal Polysaccharide-23 07/20/2001, 07/20/2005   Respiratory Syncytial Virus Vaccine,Recomb Aduvanted(Arexvy) 05/04/2022   Tdap 04/02/2011, 08/02/2015   Zoster Recombinant(Shingrix) 08/28/2020, 10/27/2020   Zoster, Live 08/10/2013    Past Medical History:  Diagnosis Date   Acoustic neuroma (HCC)    benign - left   Anxiety    Arthritis    Asthma  seasonal, when pollen is high, cold air closes me up   Atrial fibrillation (HCC)    Cancer (HCC)    skin cancer -- arm, scalp, upper back   Chronic kidney disease    sees Dr. Jeanne Zekan at [facility], left kidney is non=functioning.  right kidny is at 50%   Clotting disorder (HCC)    Hx DVT -  Xarelto    Colon polyps    Complication of anesthesia    Post op nausea/vomiting   Depression    severe.  dx 1985   DVT (deep venous thrombosis) (HCC)    sees Dr. Maude Crease    History of kidney stones    has kidney stone now.     Hypertension    Hypogonadism male    sees Dr. Redell Napoleon at [facility]   Neuropathy of both feet    OSA (obstructive sleep apnea)    Plantar fasciitis    rt foot   Pneumonia    PONV (postoperative nausea and vomiting)    Renal atrophy, left    sees Dr. Redell Napoleon at [facility]   Sleep apnea    tested 2010  - wears c-pap    Tobacco History: Social History   Tobacco Use  Smoking Status Never  Smokeless Tobacco Never  Tobacco Comments   Never smoked 04/01/23   Counseling given: Not Answered Tobacco comments: Never smoked 04/01/23   Outpatient Medications Prior to Visit  Medication Sig Dispense Refill   acetaminophen  (TYLENOL ) 500 MG tablet Take 1,000 mg by mouth as needed for moderate pain (pain score 4-6) or mild pain (pain score 1-3).     albuterol  (PROAIR  HFA) 108 (90 Base) MCG/ACT inhaler INHALE 2 PUFFS EVERY 4  HOURS AS NEEDED FOR  WHEEZING OR SHORTNESS OF  BREATH 51 g 3   Apple Cider Vinegar 600 MG CAPS Take 600 mg by mouth at bedtime.     b complex vitamins capsule Take 1 capsule by mouth at bedtime. With vit B12     bisacodyl  (DULCOLAX) 5 MG EC tablet Take 5 mg by mouth at bedtime.     buPROPion  (WELLBUTRIN  XL) 300 MG 24 hr tablet TAKE 1 TABLET BY MOUTH DAILY 90 tablet 3   carvedilol  (COREG ) 12.5 MG tablet Take 1 tablet (12.5 mg total) by mouth 2 (two) times daily. 60 tablet 5   Cholecalciferol  50 MCG (2000 UT) TABS Take 2,000 Units by mouth at bedtime.     clindamycin  (CLEOCIN ) 300 MG capsule Take 600 mg by mouth See admin instructions. Take 600 mg 1 hour prior to dental work     clonazePAM  (KLONOPIN ) 1 MG tablet Take 1 tablet (1 mg total) by mouth 2 (two) times daily as needed for anxiety. for anxiety 180 tablet 1    desvenlafaxine  (PRISTIQ ) 50 MG 24 hr tablet TAKE 1 TABLET BY MOUTH DAILY (Patient taking differently: Take 50 mg by mouth at bedtime.) 90 tablet 3   flecainide  (TAMBOCOR ) 50 MG tablet Take 1.5 tablets (75 mg total) by mouth 2 (two) times daily. 270 tablet 3   furosemide  (LASIX ) 20 MG tablet TAKE 1 TABLET BY MOUTH  DAILY AS NEEDED 90 tablet 3   hydrALAZINE  (APRESOLINE ) 50 MG tablet Take 1 tablet (50 mg total) by mouth 3 (three) times daily. 270 tablet 3   Hypromellose (GENTEAL MILD OP) Place 1 drop into both eyes daily.     rosuvastatin  (CRESTOR ) 10 MG tablet Take 1 tablet (10 mg total) by  mouth daily. 90 tablet 3   senna (SENOKOT) 8.6 MG TABS tablet Take 1 tablet by mouth at bedtime as needed for mild constipation. Vegetable     tamsulosin  (FLOMAX ) 0.4 MG CAPS capsule TAKE 1 CAPSULE BY MOUTH DAILY 90 capsule 1   XARELTO  20 MG TABS tablet TAKE 1 TABLET BY MOUTH DAILY  WITH SUPPER 90 tablet 3   No facility-administered medications prior to visit.     Review of Systems:   Constitutional: No weight loss or gain, night sweats, fevers, chills +fatigue, lassitude. HEENT: No headaches, difficulty swallowing, tooth/dental problems, or sore throat. No sneezing, itching, ear ache, nasal congestion, or post nasal drip CV:  No chest pain, orthopnea, PND, swelling in lower extremities, anasarca, dizziness, palpitations, syncope Resp: +occasional shortness of breath with exertion. No excess mucus or change in color of mucus. No productive or non-productive. No hemoptysis. No wheezing.  No chest wall deformity GI:  No heartburn, indigestion, abdominal pain, nausea, vomiting, diarrhea, change in bowel habits, loss of appetite, bloody stools.  MSK:  +chronic left knee pain. No joint swelling.  No decreased range of motion.  No back pain. Neuro: No dizziness or lightheadedness.  Psych: No depression or anxiety. Mood stable.     Physical Exam:  BP 118/76   Pulse 68   Resp 14   Ht 6' (1.829 m)   Wt 267  lb 9.6 oz (121.4 kg)   SpO2 95%   BMI 36.29 kg/m   GEN: Pleasant, interactive, well-appearing; obese; in no acute distress. HEENT:  Normocephalic and atraumatic. PERRLA. Sclera white. Nasal turbinates pink, moist and patent bilaterally. No rhinorrhea present. Oropharynx pink and moist, without exudate or edema. No lesions, ulcerations, or postnasal drip.  NECK:  Supple w/ fair ROM. No JVD present. Normal carotid impulses w/o bruits. Thyroid  symmetrical with no goiter or nodules palpated. No lymphadenopathy.   CV: RRR, no m/r/g, no peripheral edema. Pulses intact, +2 bilaterally. No cyanosis, pallor or clubbing. PULMONARY:  Unlabored, regular breathing. Clear bilaterally A&P w/o wheezes/rales/rhonchi. No accessory muscle use.  GI: BS present and normoactive. Soft, non-tender to palpation. No organomegaly or masses detected.  MSK: No erythema, warmth or tenderness. Cap refil <2 sec all extrem. No deformities or joint swelling noted.  Neuro: A/Ox3. No focal deficits noted.   Skin: Warm, no lesions or rashe Psych: Normal affect and behavior. Judgement and thought content appropriate.     Lab Results:  CBC    Component Value Date/Time   WBC 5.8 05/24/2023 1519   WBC 5.2 12/03/2022 1039   WBC 6.1 06/03/2022 1317   RBC 4.31 05/24/2023 1519   RBC 4.52 12/03/2022 1039   HGB 13.8 05/24/2023 1519   HGB 14.1 06/29/2017 1452   HCT 41.0 05/24/2023 1519   HCT 41.7 06/29/2017 1452   PLT 180 05/24/2023 1519   MCV 95 05/24/2023 1519   MCV 94 06/29/2017 1452   MCH 32.0 05/24/2023 1519   MCH 31.4 12/03/2022 1039   MCHC 33.7 05/24/2023 1519   MCHC 32.9 12/03/2022 1039   RDW 11.6 05/24/2023 1519   RDW 12.1 06/29/2017 1452   LYMPHSABS 1.2 12/03/2022 1039   LYMPHSABS 1.0 06/29/2017 1452   MONOABS 0.4 12/03/2022 1039   EOSABS 0.2 12/03/2022 1039   EOSABS 0.2 06/29/2017 1452   BASOSABS 0.1 12/03/2022 1039   BASOSABS 0.1 06/29/2017 1452    BMET    Component Value Date/Time   NA 145 (H)  05/24/2023 1519   NA 145 06/29/2017 1452  K 4.8 05/24/2023 1519   K 4.7 06/29/2017 1452   CL 109 (H) 05/24/2023 1519   CL 107 06/29/2017 1452   CO2 23 05/24/2023 1519   CO2 25 06/29/2017 1452   GLUCOSE 80 05/24/2023 1519   GLUCOSE 93 01/06/2023 0101   GLUCOSE 85 06/29/2017 1452   BUN 20 05/24/2023 1519   BUN 33 (H) 06/29/2017 1452   CREATININE 1.88 (H) 05/24/2023 1519   CREATININE 1.80 (H) 12/03/2022 1039   CREATININE 1.56 (H) 06/04/2020 0950   CALCIUM  9.3 05/24/2023 1519   CALCIUM  9.5 06/29/2017 1452   GFRNONAA 39 (L) 01/06/2023 0101   GFRNONAA 40 (L) 12/03/2022 1039   GFRNONAA 45 (L) 06/04/2020 0950   GFRAA 52 (L) 06/04/2020 0950    BNP No results found for: BNP   Imaging:  No results found.  Administration History     None           No data to display          No results found for: NITRICOXIDE      Assessment & Plan:   SLEEP APNEA, OBSTRUCTIVE OSA on CPAP. Excellent compliance and control. Receives benefit from use. Aware of proper care/use of device. Understands risks of untreated OSA. Safe driving practices reviewed. Healthy weight loss encouraged.  Patient Instructions  Continue Albuterol  inhaler 2 puffs every 6 hours as needed for shortness of breath or wheezing. Notify if symptoms persist despite rescue inhaler/neb use.  Continue to use CPAP every night, minimum of 4-6 hours a night.  Change equipment as directed. Wash your tubing with warm soap and water  daily, hang to dry. Wash humidifier portion weekly. Use bottled, distilled water  and change daily Be aware of reduced alertness and do not drive or operate heavy machinery if experiencing this or drowsiness.  Exercise encouraged, as tolerated. Healthy weight management discussed.  Avoid or decrease alcohol consumption and medications that make you more sleepy, if possible. Notify if persistent daytime sleepiness occurs even with consistent use of PAP therapy.   Follow up in one year  with Dr. Neda or sooner if needed   Asthma No recent exacerbations. Infrequent use of SABA. Compensated on current regimen. Action plan in place.   Paroxysmal atrial fibrillation (HCC) NSR rhythm today. VS stable today. Normotensive. Advised him to contact cardiology regarding medication side effects. Verbalized understanding.    I spent 28 minutes of dedicated to the care of this patient on the date of this encounter to include pre-visit review of records, face-to-face time with the patient discussing conditions above, post visit ordering of testing, clinical documentation with the electronic health record, making appropriate referrals as documented, and communicating necessary findings to members of the patients care team.  Comer LULLA Rouleau, NP 07/18/2023  Pt aware and understands NP's role.

## 2023-07-18 NOTE — Assessment & Plan Note (Signed)
 NSR rhythm today. VS stable today. Normotensive. Advised him to contact cardiology regarding medication side effects. Verbalized understanding.

## 2023-07-29 ENCOUNTER — Encounter: Payer: Self-pay | Admitting: Internal Medicine

## 2023-08-07 NOTE — Progress Notes (Unsigned)
Electrophysiology Office Note:   Date:  08/07/2023  ID:  [name], DOB 1951/03/15, MRN [id number]  Primary Cardiologist: Dietrich Pates, MD Electrophysiologist: Nobie Putnam, MD  {Click to update primary MD,subspecialty MD or APP then REFRESH:1}    History of Present Illness:   Joseph Tran is a 73 y.o. male with h/o HTN, thromboembolic dz, CKD (stage III), chronic systolic heart failure with recovered EF (40 to 55%) and persistent atrial fibrillation (dx April 2024, started on Xarelto) who is seen today for evaluation of atrial fibrillation.  He is now status post catheter ablation on 06/17/2023.  Discussed the use of AI scribe software for clinical note transcription with the patient, who gave verbal consent to proceed.  History of Present Illness            Review of systems complete and found to be negative unless listed in HPI.   EP Information / Studies Reviewed:    {EKGtoday:28818}      Echo 04/26/23:   1. Left ventricular ejection fraction, by estimation, is 55 to 60%. Left  ventricular ejection fraction by 3D volume is 57 %. The left ventricle has  normal function. The left ventricle has no regional wall motion  abnormalities. Left ventricular diastolic   parameters were normal. The average left ventricular global longitudinal  strain is -18.2 %. The global longitudinal strain is normal.   2. Right ventricular systolic function is normal. The right ventricular  size is normal. There is normal pulmonary artery systolic pressure.   3. The mitral valve is normal in structure. Trivial mitral valve  regurgitation. No evidence of mitral stenosis.   4. The aortic valve is normal in structure. Aortic valve regurgitation is  not visualized. No aortic stenosis is present.   5. Aortic dilatation noted. There is mild dilatation of the ascending  aorta, measuring 39 mm.   6. The inferior vena cava is normal in size with greater than 50%  respiratory variability, suggesting  right atrial pressure of 3 mmHg.    Coronary CTA 05/03/23: IMPRESSION: 1. Coronary calcium score of 5.85. This was 16 percentile for age and sex matched control.   2. Normal coronary origin with right dominance.   3. CAD-RADS 2. 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 Consider non-atherosclerotic causes of chest pain. Consider preventive therapy and risk factor modification   4. Plaque analysis: Total plaque volume: 48 mm3 - 4th percentile, calcified plaque volume: 3 mm3, non-calcified plaque volume: 45 mm3. Small TPV.  Risk Assessment/Calculations:   {Does this patient have ATRIAL FIBRILLATION?:347 [id number]} No BP recorded.  {Refresh Note OR Click here to enter BP  :1}***        Physical Exam:   VS:  There were no vitals taken for this visit.   Wt Readings from Last 3 Encounters:  07/18/23 267 lb 9.6 oz (121.4 kg)  07/14/23 264 lb 12.8 oz (120.1 kg)  06/17/23 255 lb (115.7 kg)     GEN: Well nourished, well developed in no acute distress NECK: No JVD; No carotid bruits CARDIAC: {EPRHYTHM:28826}, no murmurs, rubs, gallops RESPIRATORY:  Clear to auscultation without rales, wheezing or rhonchi  ABDOMEN: Soft, non-tender, non-distended EXTREMITIES:  No edema; No deformity   ASSESSMENT AND PLAN:   #Persistent atrial fibrillation: His degree of symptoms are not clear at this time.  He does however have LV dysfunction in the setting of atrial fibrillation.  Given this I think a rhythm control strategy is best.  He is currently on flecainide but  believes that he is having side effects with this medication.  Unfortunately he does not have a way of monitoring for atrial fibrillation at this time.  Given that he continues to have symptoms of dizziness lightheadedness and shortness of breath it is possible that he is having paroxysms of atrial fibrillation that we are not catching. -Patient would like to stay on flecainide and carvedilol for now.  We discussed transitioning to other  antiarrhythmic drug options if he feels like he is intolerant to the flecainide, although these will also carry potential side effects. -Discussed treatment options today for AF including antiarrhythmic drug therapy and ablation. Discussed risks, recovery and likelihood of success with each treatment strategy. Risk, benefits, and alternatives to EP study and ablation for afib were discussed. These risks include but are not limited to stroke, bleeding, vascular damage, tamponade, perforation, damage to the esophagus, lungs, phrenic nerve and other structures, pulmonary vein stenosis, worsening renal function, coronary vasospasm and death.  Discussed potential need for repeat ablation procedures and antiarrhythmic drugs after an initial ablation. The patient understands these risk and would like some time to think about his options. -Zio monitor to assess for paroxysms of atrial fibrillation.   #Secondary hypercoagulable state due to atrial fibrillation: -He has a CHA2DS2-VASc score of 3. -He is tolerating Xarelto without issue.  Continue Xarelto for stroke prophylaxis.   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  Post cardioversion in sinus rhythm his LVEF was 55 to 60%. -Given these findings I believe a rhythm control strategy is best. -Continue current regimen and regular follow-up with our general cardiology colleagues.   #Chronic right lower extremity DVT: Remote imaging suggest that the right common femoral vein is patent. -If patient would like to pursue catheter ablation, then it would be useful to repeat vascular imaging of the right lower extremity. -Continue Xarelto.     Follow up with {UEAVW:09811} {EPFOLLOW BJ:47829}  Signed, Nobie Putnam, MD

## 2023-08-08 ENCOUNTER — Ambulatory Visit (INDEPENDENT_AMBULATORY_CARE_PROVIDER_SITE_OTHER): Payer: Medicare Other

## 2023-08-08 ENCOUNTER — Ambulatory Visit: Payer: Medicare Other | Attending: Cardiology | Admitting: Cardiology

## 2023-08-08 ENCOUNTER — Encounter: Payer: Self-pay | Admitting: Cardiology

## 2023-08-08 VITALS — BP 138/80 | HR 69 | Ht 72.0 in | Wt 267.8 lb

## 2023-08-08 DIAGNOSIS — I10 Essential (primary) hypertension: Secondary | ICD-10-CM

## 2023-08-08 DIAGNOSIS — D6869 Other thrombophilia: Secondary | ICD-10-CM | POA: Diagnosis not present

## 2023-08-08 DIAGNOSIS — I5022 Chronic systolic (congestive) heart failure: Secondary | ICD-10-CM

## 2023-08-08 DIAGNOSIS — I4819 Other persistent atrial fibrillation: Secondary | ICD-10-CM

## 2023-08-08 NOTE — Patient Instructions (Signed)
Medication Instructions:  Your physician has recommended you make the following change in your medication:  1) STOP taking flecainide  *If you need a refill on your cardiac medications before your next appointment, please call your pharmacy*  Testing/Procedures: Event Monitor Your physician has recommended that you wear an event monitor. Event monitors are medical devices that record the heart's electrical activity. Doctors most often [place] these monitors to diagnose arrhythmias. Arrhythmias are problems with the speed or rhythm of the heartbeat. The monitor is a small, portable device. You can wear one while you do your normal daily activities. This is usually used to diagnose what is causing palpitations/syncope (passing out).  Follow-Up: At [facility], you and your health needs are our priority.  As part of our continuing mission to provide you with exceptional heart care, we have created designated Provider Care Teams.  These Care Teams include your primary Cardiologist (physician) and Advanced Practice Providers (APPs -  Physician Assistants and Nurse Practitioners) who all work together to provide you with the care you need, when you need it.   Your next appointment:   1 year  Provider:   You may see Nobie Putnam, MD or one of the following Advanced Practice Providers on your designated Care Team:   Francis Dowse, [place] Casimiro Needle "Mardelle Matte" [place], [place] Sherie Don, NP Canary Brim, NP    Other Instructions Christena Deem- Long Term Monitor Instructions  Your physician has requested you wear a ZIO patch monitor for 14 days.  This is a single patch monitor. [employer] supplies one patch monitor per enrollment. Additional stickers are not available. Please do not apply patch if you will be having a Nuclear Stress Test,  Echocardiogram, Cardiac CT, MRI, or Chest Xray during the period you would be wearing the  monitor. The patch cannot be worn during these tests. You cannot remove and  re-apply the  ZIO XT patch monitor.  Your ZIO patch monitor will be mailed 3 day USPS to your address on file. It may take 3-5 days  to receive your monitor after you have been enrolled.  Once you have received your monitor, please review the enclosed instructions. Your monitor  has already been registered assigning a specific monitor serial # to you.  Billing and Patient Assistance Program Information  We have supplied [employer] with any of your insurance information on file for billing purposes. [employer] offers a sliding scale Patient Assistance Program for patients that do not have  insurance, or whose insurance does not completely cover the cost of the ZIO monitor.  You must apply for the Patient Assistance Program to qualify for this discounted rate.  To apply, please call [employer] at [phone], select option 4, select option 2, ask to apply for  Patient Assistance Program. Meredeth Ide will ask your household income, and how many people  are in your household. They will quote your out-of-pocket cost based on that information.  [employer] will also be able to set up a 37-month, interest-free payment plan if needed.  Applying the monitor   Shave hair from upper left chest.  Hold abrader disc by orange tab. Rub abrader in 40 strokes over the upper left chest as  indicated in your monitor instructions.  Clean area with 4 enclosed alcohol pads. Let dry.  Apply patch as indicated in monitor instructions. Patch will be placed under collarbone on left  side of chest with arrow pointing upward.  Rub patch adhesive wings for 2 minutes. Remove white label marked "1". Remove  the white  label marked "2". Rub patch adhesive wings for 2 additional minutes.  While looking in a mirror, press and release button in center of patch. A small green light will  flash 3-4 times. This will be your only indicator that the monitor has been turned on.  Do not shower for the first 24 hours. You may shower after the  first 24 hours.  Press the button if you feel a symptom. You will hear a small click. Record Date, Time and  Symptom in the Patient Logbook.  When you are ready to remove the patch, follow instructions on the last 2 pages of Patient  Logbook. Stick patch monitor onto the last page of Patient Logbook.  Place Patient Logbook in the blue and white box. Use locking tab on box and tape box closed  securely. The blue and white box has prepaid postage on it. Please place it in the mailbox as  soon as possible. Your physician should have your test results approximately 7 days after the  monitor has been mailed back to [facility].  Call [facility] Customer Care at [phone] if you have questions regarding  your ZIO XT patch monitor. Call them immediately if you see an orange light blinking on your  monitor.  If your monitor falls off in less than 4 days, contact our Monitor department at [phone].  If your monitor becomes loose or falls off after 4 days call [employer] at [phone] for  suggestions on securing your monitor

## 2023-08-08 NOTE — Progress Notes (Unsigned)
 Enrolled for [employer] to mail a ZIO XT long term holter monitor to the patients address on file.

## 2023-08-13 DIAGNOSIS — I4819 Other persistent atrial fibrillation: Secondary | ICD-10-CM | POA: Diagnosis not present

## 2023-08-14 ENCOUNTER — Other Ambulatory Visit: Payer: Self-pay | Admitting: Family Medicine

## 2023-09-02 DIAGNOSIS — I4819 Other persistent atrial fibrillation: Secondary | ICD-10-CM | POA: Diagnosis not present

## 2023-09-03 ENCOUNTER — Other Ambulatory Visit: Payer: Self-pay | Admitting: Family Medicine

## 2023-09-09 ENCOUNTER — Encounter: Payer: Self-pay | Admitting: Internal Medicine

## 2023-09-09 DIAGNOSIS — I6529 Occlusion and stenosis of unspecified carotid artery: Secondary | ICD-10-CM

## 2023-09-12 NOTE — Telephone Encounter (Signed)
Set up for carotid USN

## 2023-09-15 ENCOUNTER — Ambulatory Visit: Payer: Medicare Other | Admitting: Physician Assistant

## 2023-09-15 ENCOUNTER — Encounter: Payer: Self-pay | Admitting: Cardiology

## 2023-09-19 ENCOUNTER — Telehealth: Payer: Self-pay | Admitting: Internal Medicine

## 2023-09-19 MED ORDER — CARVEDILOL 12.5 MG PO TABS: 12.5000 mg | ORAL_TABLET | Freq: Two times a day (BID) | ORAL | 2 refills | Status: DC

## 2023-09-19 NOTE — Telephone Encounter (Signed)
 Pt's medication was sent to pt's pharmacy as requested. Confirmation received.

## 2023-09-19 NOTE — Telephone Encounter (Signed)
*  STAT* If patient is at the pharmacy, call can be transferred to refill team.   1. Which medications need to be refilled? (please list name of each medication and dose if known) new prescription for Carvedilol- changing pharmacy   2. Would you like to learn more about the convenience, safety, & potential cost savings by using the [facility]?    3. Are you open to using the [facility] (Type [facility].    4. Which pharmacy/location (including street and city if local pharmacy) is medication to be sent to?[facility]   5. Do they need a 30 day or 90 day supply? 90 days and refills

## 2023-09-28 DIAGNOSIS — L570 Actinic keratosis: Secondary | ICD-10-CM | POA: Diagnosis not present

## 2023-09-28 DIAGNOSIS — L821 Other seborrheic keratosis: Secondary | ICD-10-CM | POA: Diagnosis not present

## 2023-10-05 ENCOUNTER — Telehealth: Payer: Self-pay

## 2023-10-05 ENCOUNTER — Encounter: Payer: Self-pay | Admitting: Internal Medicine

## 2023-10-05 ENCOUNTER — Ambulatory Visit: Payer: Medicare Other | Admitting: Internal Medicine

## 2023-10-05 VITALS — BP 130/82 | HR 64 | Ht 72.0 in | Wt 269.4 lb

## 2023-10-05 DIAGNOSIS — Z7901 Long term (current) use of anticoagulants: Secondary | ICD-10-CM

## 2023-10-05 DIAGNOSIS — I4891 Unspecified atrial fibrillation: Secondary | ICD-10-CM | POA: Diagnosis not present

## 2023-10-05 DIAGNOSIS — Z86718 Personal history of other venous thrombosis and embolism: Secondary | ICD-10-CM | POA: Diagnosis not present

## 2023-10-05 DIAGNOSIS — Z860101 Personal history of adenomatous and serrated colon polyps: Secondary | ICD-10-CM

## 2023-10-05 DIAGNOSIS — Z860100 Personal history of colon polyps, unspecified: Secondary | ICD-10-CM

## 2023-10-05 DIAGNOSIS — Z9889 Other specified postprocedural states: Secondary | ICD-10-CM

## 2023-10-05 MED ORDER — NA SULFATE-K SULFATE-MG SULF 17.5-3.13-1.6 GM/177ML PO SOLN: 1.0000 | Freq: Once | ORAL | 0 refills | Status: AC

## 2023-10-05 NOTE — Telephone Encounter (Signed)
 Tried to call the pt to set up tele preop appt, though line was busy.

## 2023-10-05 NOTE — Patient Instructions (Signed)
 You have been scheduled for a colonoscopy. Please follow written instructions given to you at your visit today.   If you use inhalers (even only as needed), please bring them with you on the day of your procedure.  DO NOT TAKE 7 DAYS PRIOR TO TEST- Trulicity (dulaglutide) Ozempic, Wegovy (semaglutide) Mounjaro (tirzepatide) Bydureon Bcise (exanatide extended release)  DO NOT TAKE 1 DAY PRIOR TO YOUR TEST Rybelsus (semaglutide) Adlyxin (lixisenatide) Victoza (liraglutide) Byetta (exanatide) ___________________________________________________________________________  If your blood pressure at your visit was 140/90 or greater, please contact your primary care physician to follow up on this.  _______________________________________________________  If you are age 46 or older, your body mass index should be between 23-30. Your Body mass index is 36.53 kg/m. If this is out of the aforementioned range listed, please consider follow up with your Primary Care Provider.  If you are age 50 or younger, your body mass index should be between 19-25. Your Body mass index is 36.53 kg/m. If this is out of the aformentioned range listed, please consider follow up with your Primary Care Provider.   ________________________________________________________  The [facility] providers would like to encourage you to use [facility] to communicate with providers for non-urgent requests or questions.  Due to long hold times on the telephone, sending your provider a message by [facility] may be a faster and more efficient way to get a response.  Please allow 48 business hours for a response.  Please remember that this is for non-urgent requests.  _______________________________________________________

## 2023-10-05 NOTE — Telephone Encounter (Signed)
 [facility] Pre-operative Risk Assessment     Request for surgical clearance:     Endoscopy Procedure  What type of surgery is being performed?     colonoscopy  When is this surgery scheduled?     11/29/23  What type of clearance is required ?   Medical clearance since recent ablation  Are there any medications that need to be held prior to surgery and how long? Not from Cardiology. Patient is on Xarelto prescribed by Dr. Myna Hidalgo. Separate clearance sent to him.  Practice name and name of physician performing surgery?      [facility]  What is your office phone and fax number?      Phone- [phone]  Fax- [phone]  Anesthesia type (None, local, MAC, general) ?       MAC   Please route your response to [name], CMA

## 2023-10-05 NOTE — Telephone Encounter (Signed)
  Joseph Tran 04-01-1951 [id number]  @DATE @   Dear Dr. Myna Hidalgo:  We have scheduled the above named patient for a(n) colonoscopy procedure. Our records show that (s)he is on anticoagulation therapy.  Please advise as to whether the patient may come off their therapy of Xarelto 2 days prior to their procedure which is scheduled for 11/29/23.  Please route your response to [name], CMA or fax response to [phone].  Sincerely,    [facility]

## 2023-10-05 NOTE — Telephone Encounter (Signed)
   Name: Joseph Tran  DOB: [birth year]  MRN: [id number]  Primary Cardiologist: Dietrich Pates, MD   Preoperative team, please contact this patient and set up a phone call appointment for further preoperative risk assessment. Please obtain consent and complete medication review. Thank you for your help.  I confirm that guidance regarding antiplatelet and oral anticoagulation therapy has been completed and, if necessary, noted below.  Per requesting office separate clearance form was sent to Dr. Myna Hidalgo regarding Xarelto hold.  I also confirmed the patient resides in the state of [place]. As per [facility] Medical Board telemedicine laws, the patient must reside in the state in which the provider is licensed.   Denyce Robert, NP 10/05/2023, 10:43 AM [facility]

## 2023-10-05 NOTE — Progress Notes (Signed)
 Patient ID: Joseph Tran, male   DOB: April 11, 1951, 73 y.o.   MRN: [id number] HPI: Joseph Tran "Joseph Tran" is a 73 year old male who presents for a gastroenterology consultation for polyp surveillance.  He is here alone today.  He has a history of adenomatous colon polyps, constipation, DVT on Xarelto, atrial fibrillation on Xarelto and status post A-fib ablation, chronic kidney disease and sleep apnea.  He has a history of lifelong constipation, effectively managed with nightly Dulcolax resulting in regular bowel movements each morning. He experiences no abdominal pain or blood in stool. Occasionally, he has difficulty swallowing when eating too quickly, but there are no progressive symptoms or heartburn.  He underwent a colonoscopy on April 07, 2020, during which four small precancerous adenomas were removed. He is due for regular surveillance colonoscopies to monitor for new polyp formation.  He has atrial fibrillation, previously managed with cardioversion and most recently ablation.  His ablation was in December.[name] He is currently on carvedilol for rate control and Xarelto for anticoagulation. He experiences significant fatigue, which he attributes to his medications. He has a history of a blood clot in the right leg extending from the groin to the ankle, for which he is on Xarelto. He takes Lasix occasionally for swelling, primarily in the right leg.  He mentions having an ultrasound scheduled for his neck due to carotid artery buildup.  Past Medical History:  Diagnosis Date   Acoustic neuroma (HCC)    benign - left   Anxiety    Arthritis    Asthma    seasonal, when pollen is high, cold air closes me up   Atrial fibrillation (HCC)    Cancer (HCC)    skin cancer -- arm, scalp, upper back   Chronic kidney disease    sees Dr. 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  right kidny is at 50%   Clotting disorder (HCC)    Hx DVT - Xarelto   Colon polyps     Complication of anesthesia    Post op nausea/vomiting   Depression    severe.  dx 1985   DVT (deep venous thrombosis) (HCC)    sees Dr. Arlan Organ    History of kidney stones    has kidney stone now.     Hypertension    Hypogonadism male    sees Dr. Hadley Pen at [facility]   Neuropathy of both feet    OSA (obstructive sleep apnea)    Plantar fasciitis    rt foot   Pneumonia    PONV (postoperative nausea and vomiting)    Renal atrophy, left    sees Dr. Hadley Pen at [facility]   Sleep apnea    tested 2010  - wears c-pap    Past Surgical History:  Procedure Laterality Date   ATRIAL FIBRILLATION ABLATION N/A 06/17/2023   Procedure: ATRIAL FIBRILLATION ABLATION;  Surgeon: Nobie Putnam, MD;  Location[facility] INVASIVE CV LAB;  Service: Cardiovascular;  Laterality: N/A;   CARDIOVERSION N/A 01/05/2023   Procedure: CARDIOVERSION;  Surgeon: Jodelle Red, MD;  Location: [facility] INVASIVE CV LAB;  Service: Cardiovascular;  Laterality: N/A;   CHEST WALL TUMOR EXCISION  2007   COLONOSCOPY  04/07/2020   per Dr. Rhea Belton, adenomatous polyps, repeat in 3 yrs    kidney caluculs  2004-05   KIDNEY STONE SURGERY     x 2c   KNEE ARTHROSCOPY  09/10/2011   right knee, per Dr. Jodi Geralds  KNEE ARTHROSCOPY Right 04/25/2017   Procedure: RIGHT KNEE ARTHROSCOPY, PARTIAL LATERAL MENISECTOMY, CHONDROPLASTY MEDIAL AND LATERAL PATELLAOFEMORAL;  Surgeon: Jodi Geralds, MD;  Location: [facility] OR;  Service: Orthopedics;  Laterality: Right;   KNEE ARTHROSCOPY Left 09/18/2021   Procedure: ARTHROSCOPY KNEE;  Surgeon: Jodi Geralds, MD;  Location: [facility] ORS;  Service: Orthopedics;  Laterality: Left;   KNEE ARTHROSCOPY WITH MEDIAL MENISECTOMY Left 09/18/2021   Procedure: KNEE ARTHROSCOPY WITH MEDIAL MENISECTOMY;  Surgeon: Jodi Geralds, MD;  Location: [facility];  Service: Orthopedics;  Laterality: Left;   madiscus cartilage  2009   MOHS SURGERY     REPLACEMENT TOTAL KNEE Left 09/2022   right knee  arthroscopy  2008   TONSILLECTOMY     TOTAL KNEE ARTHROPLASTY Right 01/19/2019   Procedure: RIGHT TOTAL KNEE ARTHROPLASTY;  Surgeon: Jodi Geralds, MD;  Location: [facility];  Service: Orthopedics;  Laterality: Right;   TOTAL KNEE ARTHROPLASTY Left 06/14/2022   Procedure: TOTAL KNEE ARTHROPLASTY;  Surgeon: Jodi Geralds, MD;  Location: [facility];  Service: Orthopedics;  Laterality: Left;   UPPER GASTROINTESTINAL ENDOSCOPY      Outpatient Medications Prior to Visit  Medication Sig Dispense Refill   acetaminophen (TYLENOL) 500 MG tablet Take 1,000 mg by mouth as needed for moderate pain (pain score 4-6) or mild pain (pain score 1-3).     albuterol (PROAIR HFA) 108 (90 Base) MCG/ACT inhaler INHALE 2 PUFFS EVERY 4  HOURS AS NEEDED FOR  WHEEZING OR SHORTNESS OF  BREATH 51 g 3   Apple Cider Vinegar 600 MG CAPS Take 600 mg by mouth at bedtime.     b complex vitamins capsule Take 1 capsule by mouth at bedtime. With vit B12     bisacodyl (DULCOLAX) 5 MG EC tablet Take 5 mg by mouth at bedtime.     buPROPion (WELLBUTRIN XL) 300 MG 24 hr tablet TAKE 1 TABLET BY MOUTH DAILY 90 tablet 3   carvedilol (COREG) 12.5 MG tablet Take 1 tablet (12.5 mg total) by mouth 2 (two) times daily. 180 tablet 2   Cholecalciferol 50 MCG (2000 UT) TABS Take 2,000 Units by mouth at bedtime.     clindamycin (CLEOCIN) 300 MG capsule Take 600 mg by mouth See admin instructions. Take 600 mg 1 hour prior to dental work     clonazePAM (KLONOPIN) 1 MG tablet Take 1 tablet (1 mg total) by mouth 2 (two) times daily as needed for anxiety. for anxiety 180 tablet 1   desvenlafaxine (PRISTIQ) 50 MG 24 hr tablet TAKE 1 TABLET BY MOUTH DAILY 90 tablet 0   furosemide (LASIX) 20 MG tablet TAKE 1 TABLET BY MOUTH  DAILY AS NEEDED 90 tablet 3   Hypromellose (GENTEAL MILD OP) Place 1 drop into both eyes daily.     rosuvastatin (CRESTOR) 10 MG tablet Take 1 tablet (10 mg total) by mouth daily. 90 tablet 3   senna (SENOKOT) 8.6 MG TABS tablet Take 1 tablet  by mouth at bedtime as needed for mild constipation. Vegetable     tamsulosin (FLOMAX) 0.4 MG CAPS capsule TAKE 1 CAPSULE BY MOUTH DAILY 90 capsule 3   XARELTO 20 MG TABS tablet TAKE 1 TABLET BY MOUTH DAILY  WITH SUPPER 90 tablet 3   hydrALAZINE (APRESOLINE) 50 MG tablet Take 1 tablet (50 mg total) by mouth 3 (three) times daily. 270 tablet 3   No facility-administered medications prior to visit.    Allergies  Allergen Reactions   Allopurinol Other (See Comments)    PATIENT  PREFERENCE Pt refused due to mom developing steven johnson syndrome.   Penicillins Hives   Sulfamethoxazole Hives   Sulfonamide Derivatives Hives   Dilaudid [Hydromorphone Hcl] Nausea And Vomiting    Family History  Problem Relation Age of Onset   Heart disease Father    Throat cancer Maternal Grandmother    Liver cancer Paternal Grandmother    Heart disease Other        parents   Hyperlipidemia Other    Stroke Other        grandparents   Sudden death Other        uncle less than 93 yrs old   Colon cancer Neg Hx    Esophageal cancer Neg Hx    Rectal cancer Neg Hx    Stomach cancer Neg Hx     Social History   Tobacco Use   Smoking status: Never   Smokeless tobacco: Never   Tobacco comments:    Never smoked 04/01/23  Vaping Use   Vaping status: Never Used  Substance Use Topics   Alcohol use: Never   Drug use: Never    ROS: As per history of present illness, otherwise negative  BP 130/82   Pulse 64   Ht 6' (1.829 m)   Wt 269 lb 6 oz (122.2 kg)   BMI 36.53 kg/m  Gen: awake, alert, NAD HEENT: anicteric  CV: RRR, no mrg Pulm: CTA b/l Abd: soft, NT/ND, +BS throughout Ext: no c/c/e Neuro: nonfocal   RELEVANT LABS AND IMAGING: CBC    Component Value Date/Time   WBC 5.8 05/24/2023 1519   WBC 5.2 12/03/2022 1039   WBC 6.1 06/03/2022 1317   RBC 4.31 05/24/2023 1519   RBC 4.52 12/03/2022 1039   HGB 13.8 05/24/2023 1519   HGB 14.1 06/29/2017 1452   HCT 41.0 05/24/2023 1519   HCT  41.7 06/29/2017 1452   PLT 180 05/24/2023 1519   MCV 95 05/24/2023 1519   MCV 94 06/29/2017 1452   MCH 32.0 05/24/2023 1519   MCH 31.4 12/03/2022 1039   MCHC 33.7 05/24/2023 1519   MCHC 32.9 12/03/2022 1039   RDW 11.6 05/24/2023 1519   RDW 12.1 06/29/2017 1452   LYMPHSABS 1.2 12/03/2022 1039   LYMPHSABS 1.0 06/29/2017 1452   MONOABS 0.4 12/03/2022 1039   EOSABS 0.2 12/03/2022 1039   EOSABS 0.2 06/29/2017 1452   BASOSABS 0.1 12/03/2022 1039   BASOSABS 0.1 06/29/2017 1452    CMP     Component Value Date/Time   NA 145 (H) 05/24/2023 1519   NA 145 06/29/2017 1452   K 4.8 05/24/2023 1519   K 4.7 06/29/2017 1452   CL 109 (H) 05/24/2023 1519   CL 107 06/29/2017 1452   CO2 23 05/24/2023 1519   CO2 25 06/29/2017 1452   GLUCOSE 80 05/24/2023 1519   GLUCOSE 93 01/06/2023 0101   GLUCOSE 85 06/29/2017 1452   BUN 20 05/24/2023 1519   BUN 33 (H) 06/29/2017 1452   CREATININE 1.88 (H) 05/24/2023 1519   CREATININE 1.80 (H) 12/03/2022 1039   CREATININE 1.56 (H) 06/04/2020 0950   CALCIUM 9.3 05/24/2023 1519   CALCIUM 9.5 06/29/2017 1452   PROT 6.4 05/24/2023 1519   PROT 6.9 06/29/2017 1452   ALBUMIN 4.0 05/24/2023 1519   ALBUMIN 4.2 09/20/2016 1441   AST 22 05/24/2023 1519   AST 14 (L) 12/03/2022 1039   ALT 20 05/24/2023 1519   ALT 13 12/03/2022 1039   ALT 23 06/29/2017 1452  ALKPHOS 92 05/24/2023 1519   ALKPHOS 83 06/29/2017 1452   BILITOT 0.5 05/24/2023 1519   BILITOT 0.6 12/03/2022 1039   GFRNONAA 39 (L) 01/06/2023 0101   GFRNONAA 40 (L) 12/03/2022 1039   GFRNONAA 45 (L) 06/04/2020 0950   GFRAA 52 (L) 06/04/2020 0950   DIAGNOSTIC Echocardiogram: Normal cardiac function (October/November 2024) Colonoscopy: Four small adenomas removed (04/07/2020)  ASSESSMENT/PLAN:  Colonic adenomas Four small adenomas removed in 2021.  Appropriate for surveillance colonoscopy at this time.   Xarelto management requires coordination with cardiology and hematology. - Schedule  colonoscopy in [facility]. - Pause Xarelto 48 hours before procedure. - Consult cardiology and hematology for Xarelto management. - Resume Xarelto based on colonoscopy findings. - Provide transportation options for colonoscopy.  Atrial fibrillation Managed with cardioversion, ablation, and medication. Xarelto for anticoagulation and DVT management. Coordination with cardiology required for colonoscopy.  Hx of Deep vein thrombosis (DVT) Right leg clot managed with Xarelto. Coordination with hematology required for colonoscopy.  Will hold Xarelto 2 days prior to endoscopic procedures - will instruct when and how to resume after procedure. Benefits and risks of procedure explained including risks of bleeding, perforation, infection, missed lesions, reactions to medications and possible need for hospitalization and surgery for complications. Additional rare but real risk of stroke or other vascular clotting events off Xarelto also explained and need to seek urgent help if any signs of these problems occur. Will communicate by phone or EMR with patient's  prescribing provider to confirm that holding Xarelto is reasonable in this case.    Cc:Fry, Tera Mater, Md [address] [place],  [place] [address]

## 2023-10-06 ENCOUNTER — Encounter: Payer: Self-pay | Admitting: Internal Medicine

## 2023-10-06 ENCOUNTER — Ambulatory Visit (HOSPITAL_COMMUNITY)
Admission: RE | Admit: 2023-10-06 | Discharge: 2023-10-06 | Disposition: A | Discharge status: Home / Self Care | Source: Ambulatory Visit | Attending: Internal Medicine | Admitting: Internal Medicine

## 2023-10-06 DIAGNOSIS — I6522 Occlusion and stenosis of left carotid artery: Secondary | ICD-10-CM | POA: Insufficient documentation

## 2023-10-06 DIAGNOSIS — I6529 Occlusion and stenosis of unspecified carotid artery: Secondary | ICD-10-CM | POA: Diagnosis not present

## 2023-10-06 NOTE — Telephone Encounter (Signed)
 2nd attempt, called patient no answer, phone line was busy could not leave a vm, to set up a Tele visit for a pre-op clearance.

## 2023-10-07 NOTE — Telephone Encounter (Signed)
 3rd attempt : Called patient to scheduled pre-op clearance tele visit, NA, no VM to leave a message.

## 2023-10-10 NOTE — Telephone Encounter (Signed)
 Called patient and LM that Dr. Myna Hidalgo has approved that he cab hold Xarelto for 2 days (5-25 and 5-26) prior to colonoscopy with Dr. Rhea Belton. Asked him to call back to confirm message and understanding

## 2023-10-12 NOTE — Telephone Encounter (Signed)
 Called mobile number and line is busy. Called other number in patient's chart (home number) and it rang without an answer. No voicemail to leave a message.

## 2023-10-17 NOTE — Telephone Encounter (Signed)
 Called mobile number and number is busy. Called home number with no answer and no voicemail to leave a message. MyChart messaged patient to let him to contact our office.

## 2023-10-18 NOTE — Telephone Encounter (Signed)
 Called mobile number and line is busy. MyChart message has not been read by patient.

## 2023-10-24 ENCOUNTER — Other Ambulatory Visit: Payer: Self-pay | Admitting: Family Medicine

## 2023-10-24 NOTE — Telephone Encounter (Signed)
 Please see MyChart message from 10/17/23. Patient states he has not received any phone calls. Informed patient to contact his Cardiologist office to schedule a pre-op appt prior to colonoscopy.

## 2023-10-25 NOTE — Telephone Encounter (Signed)
 Pt LOV was on 03/03/23 Last refill was done on 11/09/22 Please advise

## 2023-10-25 NOTE — Telephone Encounter (Signed)
 Informed patient Dr. Maria Shiner states it is ok patient can hold Xarelto  2 days prior to his procedure. Patient verbalized understanding. Patient also encouraged to contact Dr. Luanne Runner office to schedule pre-op appt prior to colonoscopy. Patient states he will contact their office. Please see other telephone note (10/05/23) for cardiac clearance.

## 2023-10-25 NOTE — Telephone Encounter (Signed)
 Pt called in to set up tele visit.  Phone number: [phone]

## 2023-10-25 NOTE — Telephone Encounter (Signed)
 Attempted to call patient but line kept ringing busy.

## 2023-10-27 ENCOUNTER — Telehealth: Payer: Self-pay | Admitting: *Deleted

## 2023-10-27 NOTE — Telephone Encounter (Signed)
 Pt has been scheduled for a tele visit, 4/282/5 10:00,  consent on file / medications reconciled.    Patient Consent for Virtual Visit        Joseph Tran has provided verbal consent on 10/27/2023 for a virtual visit (video or telephone).   CONSENT FOR VIRTUAL VISIT FOR:  Joseph Tran  By participating in this virtual visit I agree to the following:  I hereby voluntarily request, consent and authorize [facility] and its employed or contracted physicians, physician assistants, nurse practitioners or other licensed health care professionals (the Practitioner), to provide me with telemedicine health care services (the "Services") as deemed necessary by the treating Practitioner. I acknowledge and consent to receive the Services by the Practitioner via telemedicine. I understand that the telemedicine visit will involve communicating with the Practitioner through live audiovisual communication technology and the disclosure of certain medical information by electronic transmission. I acknowledge that I have been given the opportunity to request an in-person assessment or other available alternative prior to the telemedicine visit and am voluntarily participating in the telemedicine visit.  I understand that I have the right to withhold or withdraw my consent to the use of telemedicine in the course of my care at any time, without affecting my right to future care or treatment, and that the Practitioner or I may terminate the telemedicine visit at any time. I understand that I have the right to inspect all information obtained and/or recorded in the course of the telemedicine visit and may receive copies of available information for a reasonable fee.  I understand that some of the potential risks of receiving the Services via telemedicine include:  Delay or interruption in medical evaluation due to technological equipment failure or disruption; Information transmitted may not be sufficient  (e.g. poor resolution of images) to allow for appropriate medical decision making by the Practitioner; and/or  In rare instances, security protocols could fail, causing a breach of personal health information.  Furthermore, I acknowledge that it is my responsibility to provide information about my medical history, conditions and care that is complete and accurate to the best of my ability. I acknowledge that Practitioner's advice, recommendations, and/or decision may be based on factors not within their control, such as incomplete or inaccurate data provided by me or distortions of diagnostic images or specimens that may result from electronic transmissions. I understand that the practice of medicine is not an exact science and that Practitioner makes no warranties or guarantees regarding treatment outcomes. I acknowledge that a copy of this consent can be made available to me via my patient portal [facility] MyChart), or I can request a printed copy by calling the office of [facility].    I understand that my insurance will be billed for this visit.   I have read or had this consent read to me. I understand the contents of this consent, which adequately explains the benefits and risks of the Services being provided via telemedicine.  I have been provided ample opportunity to ask questions regarding this consent and the Services and have had my questions answered to my satisfaction. I give my informed consent for the services to be provided through the use of telemedicine in my medical care

## 2023-10-27 NOTE — Telephone Encounter (Signed)
 Pt has been scheduled for a tele visit, 10/31/23 10:00.  Consent on file / medications reonciled.  [phone]

## 2023-10-30 NOTE — Progress Notes (Unsigned)
 Virtual Visit via Telephone Note   Because of Joseph Tran co-morbid illnesses, he is at least at moderate risk for complications without adequate follow up.  This format is felt to be most appropriate for this patient at this time.  Due to technical limitations with video connection (technology), today's appointment will be conducted as an audio only telehealth visit, and Joseph Tran verbally agreed to proceed in this manner.   All issues noted in this document were discussed and addressed.  No physical exam could be performed with this format.  Evaluation Performed:  Preoperative cardiovascular risk assessment _____________   Date:  10/30/2023   Patient ID:  Joseph Tran, DOB Feb 25, 1951, MRN [id number] Patient Location:  Home Provider location:   Office  Primary Care Provider:  Donley Furth, MD Primary Cardiologist:  Ola Berger, MD  Chief Complaint / Patient Profile   73 y.o. y/o male with a h/o atrial fibrillation, HTN, chronic systolic CHF who is pending colonoscopy and presents today for telephonic preoperative cardiovascular risk assessment.  History of Present Illness    Joseph Tran is a 73 y.o. male who presents via audio/video conferencing for a telehealth visit today.  Pt was last seen in cardiology clinic on 08/08/2023 by Dr. Daneil Dunker.  At that time Joseph Tran was doing well .  The patient is now pending procedure as outlined above. Since his last visit, he continues to be stable from a cardiac standpoint.  Today he denies chest pain, shortness of breath, lower extremity edema, palpitations, melena, hematuria, hemoptysis, diaphoresis, weakness, presyncope, syncope, orthopnea, and PND.   Past Medical History    Past Medical History:  Diagnosis Date   Acoustic neuroma (HCC)    benign - left   Anxiety    Arthritis    Asthma    seasonal, when pollen is high, cold air closes me up   Atrial fibrillation (HCC)    Cancer (HCC)    skin cancer -- arm, scalp,  upper back   Chronic kidney disease    sees Dr. 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  right kidny is at 50%   Clotting disorder (HCC)    Hx DVT - Xarelto    Colon polyps    Complication of anesthesia    Post op nausea/vomiting   Depression    severe.  dx 1985   DVT (deep venous thrombosis) (HCC)    sees Dr. Gray Layman    History of kidney stones    has kidney stone now.     Hypertension    Hypogonadism male    sees Dr. Terese Fendt at [facility]   Neuropathy of both feet    OSA (obstructive sleep apnea)    Plantar fasciitis    rt foot   Pneumonia    PONV (postoperative nausea and vomiting)    Renal atrophy, left    sees Dr. Terese Fendt at [facility]   Sleep apnea    tested 2010  - wears c-pap   Past Surgical History:  Procedure Laterality Date   ATRIAL FIBRILLATION ABLATION N/A 06/17/2023   Procedure: ATRIAL FIBRILLATION ABLATION;  Surgeon: Ardeen Kohler, MD;  Location: [facility] INVASIVE CV LAB;  Service: Cardiovascular;  Laterality: N/A;   CARDIOVERSION N/A 01/05/2023   Procedure: CARDIOVERSION;  Surgeon: Sheryle Donning, MD;  Location: [facility] INVASIVE CV LAB;  Service: Cardiovascular;  Laterality: N/A;   CHEST WALL TUMOR EXCISION  2007   COLONOSCOPY  04/07/2020  per Dr. Bridgett Camps, adenomatous polyps, repeat in 3 yrs    kidney caluculs  2004-05   KIDNEY STONE SURGERY     x 2c   KNEE ARTHROSCOPY  09/10/2011   right knee, per Dr. Neil Balls    KNEE ARTHROSCOPY Right 04/25/2017   Procedure: RIGHT KNEE ARTHROSCOPY, PARTIAL LATERAL MENISECTOMY, CHONDROPLASTY MEDIAL AND LATERAL PATELLAOFEMORAL;  Surgeon: Neil Balls, MD;  Location: [facility] OR;  Service: Orthopedics;  Laterality: Right;   KNEE ARTHROSCOPY Left 09/18/2021   Procedure: ARTHROSCOPY KNEE;  Surgeon: Neil Balls, MD;  Location: [facility] ORS;  Service: Orthopedics;  Laterality: Left;   KNEE ARTHROSCOPY WITH MEDIAL MENISECTOMY Left 09/18/2021   Procedure: KNEE ARTHROSCOPY WITH MEDIAL  MENISECTOMY;  Surgeon: Neil Balls, MD;  Location: [facility];  Service: Orthopedics;  Laterality: Left;   madiscus cartilage  2009   MOHS SURGERY     REPLACEMENT TOTAL KNEE Left 09/2022   right knee arthroscopy  2008   TONSILLECTOMY     TOTAL KNEE ARTHROPLASTY Right 01/19/2019   Procedure: RIGHT TOTAL KNEE ARTHROPLASTY;  Surgeon: Neil Balls, MD;  Location: [facility];  Service: Orthopedics;  Laterality: Right;   TOTAL KNEE ARTHROPLASTY Left 06/14/2022   Procedure: TOTAL KNEE ARTHROPLASTY;  Surgeon: Neil Balls, MD;  Location: [facility];  Service: Orthopedics;  Laterality: Left;   UPPER GASTROINTESTINAL ENDOSCOPY      Allergies  Allergies  Allergen Reactions   Allopurinol Other (See Comments)    PATIENT PREFERENCE Pt refused due to mom developing steven johnson syndrome.   Penicillins Hives   Sulfamethoxazole Hives   Sulfonamide Derivatives Hives   Dilaudid  [Hydromorphone  Hcl] Nausea And Vomiting    Home Medications    Prior to Admission medications   Medication Sig Start Date End Date Taking? Authorizing Provider  acetaminophen  (TYLENOL ) 500 MG tablet Take 1,000 mg by mouth as needed for moderate pain (pain score 4-6) or mild pain (pain score 1-3).    [provider]  albuterol  (PROAIR  HFA) 108 (90 Base) MCG/ACT inhaler INHALE 2 PUFFS EVERY 4  HOURS AS NEEDED FOR  WHEEZING OR SHORTNESS OF  BREATH 07/21/21   Donley Furth, MD  Apple Cider Vinegar 600 MG CAPS Take 600 mg by mouth at bedtime.    [provider]  b complex vitamins capsule Take 1 capsule by mouth at bedtime. With vit B12    [provider]  bisacodyl  (DULCOLAX) 5 MG EC tablet Take 5 mg by mouth at bedtime.    [provider]  buPROPion  (WELLBUTRIN  XL) 300 MG 24 hr tablet TAKE 1 TABLET BY MOUTH DAILY 11/15/22   Donley Furth, MD  carvedilol  (COREG ) 12.5 MG tablet Take 1 tablet (12.5 mg total) by mouth 2 (two) times daily. 09/19/23   Elmyra Haggard, MD  Cholecalciferol  50 MCG (2000 UT) TABS  Take 2,000 Units by mouth at bedtime.    [provider]  clindamycin  (CLEOCIN ) 300 MG capsule Take 600 mg by mouth See admin instructions. Take 600 mg 1 hour prior to dental work 08/18/20   [provider]  clonazePAM  (KLONOPIN ) 1 MG tablet TAKE 1 TABLET(1 MG) BY MOUTH TWICE DAILY AS NEEDED FOR ANXIETY 10/25/23   Donley Furth, MD  desvenlafaxine  (PRISTIQ ) 50 MG 24 hr tablet TAKE 1 TABLET BY MOUTH DAILY 09/05/23   Donley Furth, MD  furosemide  (LASIX ) 20 MG tablet TAKE 1 TABLET BY MOUTH  DAILY AS NEEDED 06/17/21   Ivor Mars, MD  hydrALAZINE  (APRESOLINE ) 50 MG tablet  Take 1 tablet (50 mg total) by mouth 3 (three) times daily. 03/08/23 10/27/23  Elmyra Haggard, MD  Hypromellose (GENTEAL MILD OP) Place 1 drop into both eyes daily.    [provider]  rosuvastatin  (CRESTOR ) 10 MG tablet Take 1 tablet (10 mg total) by mouth daily. 05/04/23   Elmyra Haggard, MD  senna (SENOKOT) 8.6 MG TABS tablet Take 1 tablet by mouth at bedtime as needed for mild constipation. Vegetable    [provider]  tamsulosin  (FLOMAX ) 0.4 MG CAPS capsule TAKE 1 CAPSULE BY MOUTH DAILY 08/22/23   Donley Furth, MD  XARELTO  20 MG TABS tablet TAKE 1 TABLET BY MOUTH DAILY  WITH SUPPER 06/01/23   Donley Furth, MD    Physical Exam    Vital Signs:  Joseph Tran does not have vital signs available for review today.  Given telephonic nature of communication, physical exam is limited. AAOx3. NAD. Normal affect.  Speech and respirations are unlabored.  Accessory Clinical Findings    None  Assessment & Plan    1.  Preoperative Cardiovascular Risk Assessment: Colonoscopy, [place] [facility], fax #[phone]    Primary Cardiologist: Ola Berger, MD  Chart reviewed as part of pre-operative protocol coverage. Given past medical history and time since last visit, based on ACC/AHA guidelines, Joseph Tran would be at acceptable risk for the planned procedure without further  cardiovascular testing.   Patient was advised that if he develops new symptoms prior to surgery to contact our office to arrange a follow-up appointment.  He verbalized understanding.  Patient's Xarelto  is prescribed by Dr. Maria Shiner.  Recommendations for holding Xarelto  will need to come from Dr. Maria Shiner.  I will route this recommendation to the requesting party via Epic fax function and remove from pre-op pool.       Time:   Today, I have spent 10 minutes with the patient with telehealth technology discussing medical history, symptoms, and management plan.  I spent 10 minutes reviewing past medical history, cardiac medications, and cardiac testing.   Carie Charity, NP  10/30/2023, 6:46 PM

## 2023-10-31 ENCOUNTER — Ambulatory Visit: Attending: Cardiology

## 2023-10-31 ENCOUNTER — Encounter: Payer: Self-pay | Admitting: Internal Medicine

## 2023-10-31 DIAGNOSIS — Z01810 Encounter for preprocedural cardiovascular examination: Secondary | ICD-10-CM

## 2023-10-31 NOTE — Telephone Encounter (Signed)
 Patient had telephone visit today and was cleared from colonoscopy. See below.  Expand All Collapse All     Virtual Visit via Telephone Note    Because of Joseph Tran co-morbid illnesses, he is at least at moderate risk for complications without adequate follow up.  This format is felt to be most appropriate for this patient at this time.  Due to technical limitations with video connection (technology), today's appointment will be conducted as an audio only telehealth visit, and Joseph Tran verbally agreed to proceed in this manner.   All issues noted in this document were discussed and addressed.  No physical exam could be performed with this format.   Evaluation Performed:  Preoperative cardiovascular risk assessment _____________    Date:  10/30/2023    Patient ID:  Joseph Tran, DOB 04-Sep-1950, MRN [id number] Patient Location:  Home Provider location:   Office   Primary Care Provider:  Donley Furth, MD Primary Cardiologist:  Ola Berger, MD   Chief Complaint / Patient Profile    73 y.o. y/o male with a h/o atrial fibrillation, HTN, chronic systolic CHF who is pending colonoscopy and presents today for telephonic preoperative cardiovascular risk assessment.   History of Present Illness    Joseph Tran is a 73 y.o. male who presents via audio/video conferencing for a telehealth visit today.  Pt was last seen in cardiology clinic on 08/08/2023 by Dr. Daneil Dunker.  At that time Joseph Tran was doing well .  The patient is now pending procedure as outlined above. Since his last visit, he continues to be stable from a cardiac standpoint.   Today he denies chest pain, shortness of breath, lower extremity edema, palpitations, melena, hematuria, hemoptysis, diaphoresis, weakness, presyncope, syncope, orthopnea, and PND.     Past Medical History        Past Medical History:  Diagnosis Date   Acoustic neuroma (HCC)      benign - left   Anxiety     Arthritis     Asthma       seasonal, when pollen is high, cold air closes me up   Atrial fibrillation (HCC)     Cancer (HCC)      skin cancer -- arm, scalp, upper back   Chronic kidney disease      sees Dr. 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  right kidny is at 50%   Clotting disorder (HCC)      Hx DVT - Xarelto    Colon polyps     Complication of anesthesia      Post op nausea/vomiting   Depression      severe.  dx 1985   DVT (deep venous thrombosis) (HCC)      sees Dr. Gray Layman    History of kidney stones      has kidney stone now.     Hypertension     Hypogonadism male      sees Dr. Terese Fendt at [facility]   Neuropathy of both feet     OSA (obstructive sleep apnea)     Plantar fasciitis      rt foot   Pneumonia     PONV (postoperative nausea and vomiting)     Renal atrophy, left      sees Dr. Terese Fendt at [facility]   Sleep apnea      tested 2010  - wears c-pap  Past Surgical History:  Procedure Laterality Date   ATRIAL FIBRILLATION ABLATION N/A 06/17/2023    Procedure: ATRIAL FIBRILLATION ABLATION;  Surgeon: Ardeen Kohler, MD;  Location: [facility] INVASIVE CV LAB;  Service: Cardiovascular;  Laterality: N/A;   CARDIOVERSION N/A 01/05/2023    Procedure: CARDIOVERSION;  Surgeon: Sheryle Donning, MD;  Location: [facility] INVASIVE CV LAB;  Service: Cardiovascular;  Laterality: N/A;   CHEST WALL TUMOR EXCISION   2007   COLONOSCOPY   04/07/2020    per Dr. Bridgett Camps, adenomatous polyps, repeat in 3 yrs    kidney caluculs   2004-05   KIDNEY STONE SURGERY        x 2c   KNEE ARTHROSCOPY   09/10/2011    right knee, per Dr. Neil Balls    KNEE ARTHROSCOPY Right 04/25/2017    Procedure: RIGHT KNEE ARTHROSCOPY, PARTIAL LATERAL MENISECTOMY, CHONDROPLASTY MEDIAL AND LATERAL PATELLAOFEMORAL;  Surgeon: Neil Balls, MD;  Location: [facility] OR;  Service: Orthopedics;  Laterality: Right;   KNEE ARTHROSCOPY Left 09/18/2021    Procedure: ARTHROSCOPY KNEE;   Surgeon: Neil Balls, MD;  Location: [facility];  Service: Orthopedics;  Laterality: Left;   KNEE ARTHROSCOPY WITH MEDIAL MENISECTOMY Left 09/18/2021    Procedure: KNEE ARTHROSCOPY WITH MEDIAL MENISECTOMY;  Surgeon: Neil Balls, MD;  Location: [facility];  Service: Orthopedics;  Laterality: Left;   madiscus cartilage   2009   MOHS SURGERY       REPLACEMENT TOTAL KNEE Left 09/2022   right knee arthroscopy   2008   TONSILLECTOMY       TOTAL KNEE ARTHROPLASTY Right 01/19/2019    Procedure: RIGHT TOTAL KNEE ARTHROPLASTY;  Surgeon: Neil Balls, MD;  Location: [facility];  Service: Orthopedics;  Laterality: Right;   TOTAL KNEE ARTHROPLASTY Left 06/14/2022    Procedure: TOTAL KNEE ARTHROPLASTY;  Surgeon: Neil Balls, MD;  Location: [facility];  Service: Orthopedics;  Laterality: Left;   UPPER GASTROINTESTINAL ENDOSCOPY              Allergies   Allergies       Allergies  Allergen Reactions   Allopurinol Other (See Comments)      PATIENT PREFERENCE Pt refused due to mom developing steven johnson syndrome.   Penicillins Hives   Sulfamethoxazole Hives   Sulfonamide Derivatives Hives   Dilaudid  [Hydromorphone  Hcl] Nausea And Vomiting        Home Medications           Prior to Admission medications   Medication Sig Start Date End Date Taking? Authorizing Provider  acetaminophen  (TYLENOL ) 500 MG tablet Take 1,000 mg by mouth as needed for moderate pain (pain score 4-6) or mild pain (pain score 1-3).       [provider]  albuterol  (PROAIR  HFA) 108 (90 Base) MCG/ACT inhaler INHALE 2 PUFFS EVERY 4  HOURS AS NEEDED FOR  WHEEZING OR SHORTNESS OF  BREATH 07/21/21     Donley Furth, MD  Apple Cider Vinegar 600 MG CAPS Take 600 mg by mouth at bedtime.       [provider]  b complex vitamins capsule Take 1 capsule by mouth at bedtime. With vit B12       [provider]  bisacodyl  (DULCOLAX) 5 MG EC tablet Take 5 mg by mouth at bedtime.       [provider]  buPROPion   (WELLBUTRIN  XL) 300 MG 24 hr tablet TAKE 1 TABLET BY MOUTH DAILY 11/15/22     Donley Furth, MD  carvedilol  (COREG )  12.5 MG tablet Take 1 tablet (12.5 mg total) by mouth 2 (two) times daily. 09/19/23     Elmyra Haggard, MD  Cholecalciferol  50 MCG (2000 UT) TABS Take 2,000 Units by mouth at bedtime.       [provider]  clindamycin  (CLEOCIN ) 300 MG capsule Take 600 mg by mouth See admin instructions. Take 600 mg 1 hour prior to dental work 08/18/20     [provider]  clonazePAM  (KLONOPIN ) 1 MG tablet TAKE 1 TABLET(1 MG) BY MOUTH TWICE DAILY AS NEEDED FOR ANXIETY 10/25/23     Donley Furth, MD  desvenlafaxine  (PRISTIQ ) 50 MG 24 hr tablet TAKE 1 TABLET BY MOUTH DAILY 09/05/23     Donley Furth, MD  furosemide  (LASIX ) 20 MG tablet TAKE 1 TABLET BY MOUTH  DAILY AS NEEDED 06/17/21     Ivor Mars, MD  hydrALAZINE  (APRESOLINE ) 50 MG tablet Take 1 tablet (50 mg total) by mouth 3 (three) times daily. 03/08/23 10/27/23   Elmyra Haggard, MD  Hypromellose (GENTEAL MILD OP) Place 1 drop into both eyes daily.       [provider]  rosuvastatin  (CRESTOR ) 10 MG tablet Take 1 tablet (10 mg total) by mouth daily. 05/04/23     Elmyra Haggard, MD  senna (SENOKOT) 8.6 MG TABS tablet Take 1 tablet by mouth at bedtime as needed for mild constipation. Vegetable       [provider]  tamsulosin  (FLOMAX ) 0.4 MG CAPS capsule TAKE 1 CAPSULE BY MOUTH DAILY 08/22/23     Donley Furth, MD  XARELTO  20 MG TABS tablet TAKE 1 TABLET BY MOUTH DAILY  WITH SUPPER 06/01/23     Donley Furth, MD      Physical Exam    Vital Signs:  Joseph Tran does not have vital signs available for review today.   Given telephonic nature of communication, physical exam is limited. AAOx3. NAD. Normal affect.  Speech and respirations are unlabored.   Accessory Clinical Findings    None   Assessment & Plan    1.  Preoperative Cardiovascular Risk Assessment: Colonoscopy, [place] [facility], fax  #[phone]       Primary Cardiologist: Ola Berger, MD   Chart reviewed as part of pre-operative protocol coverage. Given past medical history and time since last visit, based on ACC/AHA guidelines, Joseph Tran would be at acceptable risk for the planned procedure without further cardiovascular testing.    Patient was advised that if he develops new symptoms prior to surgery to contact our office to arrange a follow-up appointment.  He verbalized understanding.   Patient's Xarelto  is prescribed by Dr. Maria Shiner.  Recommendations for holding Xarelto  will need to come from Dr. Maria Shiner.   I will route this recommendation to the requesting party via Epic fax function and remove from pre-op pool.

## 2023-11-02 ENCOUNTER — Encounter: Payer: Self-pay | Admitting: Hematology & Oncology

## 2023-11-02 NOTE — Telephone Encounter (Signed)
 I am not sure why he is so fatigued  Could check TSH and CBC He had an echo last year that was normal  CCTA showed minimal plaquing    I do not think it is a heart problem

## 2023-11-11 ENCOUNTER — Encounter: Payer: Self-pay | Admitting: Family Medicine

## 2023-11-11 ENCOUNTER — Ambulatory Visit (INDEPENDENT_AMBULATORY_CARE_PROVIDER_SITE_OTHER): Admitting: Family Medicine

## 2023-11-11 VITALS — BP 126/80 | HR 75 | Temp 98.2°F | Wt 271.0 lb

## 2023-11-11 DIAGNOSIS — I480 Paroxysmal atrial fibrillation: Secondary | ICD-10-CM

## 2023-11-11 DIAGNOSIS — I4819 Other persistent atrial fibrillation: Secondary | ICD-10-CM | POA: Diagnosis not present

## 2023-11-11 DIAGNOSIS — E538 Deficiency of other specified B group vitamins: Secondary | ICD-10-CM

## 2023-11-11 DIAGNOSIS — R739 Hyperglycemia, unspecified: Secondary | ICD-10-CM | POA: Diagnosis not present

## 2023-11-11 DIAGNOSIS — R5383 Other fatigue: Secondary | ICD-10-CM

## 2023-11-11 DIAGNOSIS — M329 Systemic lupus erythematosus, unspecified: Secondary | ICD-10-CM | POA: Diagnosis not present

## 2023-11-11 DIAGNOSIS — I10 Essential (primary) hypertension: Secondary | ICD-10-CM | POA: Diagnosis not present

## 2023-11-11 DIAGNOSIS — G4733 Obstructive sleep apnea (adult) (pediatric): Secondary | ICD-10-CM

## 2023-11-11 DIAGNOSIS — N1831 Chronic kidney disease, stage 3a: Secondary | ICD-10-CM

## 2023-11-11 DIAGNOSIS — E039 Hypothyroidism, unspecified: Secondary | ICD-10-CM | POA: Diagnosis not present

## 2023-11-11 LAB — CBC WITH DIFFERENTIAL/PLATELET
Basophils Absolute: 0.1 10*3/uL (ref 0.0–0.1)
Basophils Relative: 1.1 % (ref 0.0–3.0)
Eosinophils Absolute: 0.2 10*3/uL (ref 0.0–0.7)
Eosinophils Relative: 4.1 % (ref 0.0–5.0)
HCT: 41.5 % (ref 39.0–52.0)
Hemoglobin: 13.7 g/dL (ref 13.0–17.0)
Lymphocytes Relative: 18.2 % (ref 12.0–46.0)
Lymphs Abs: 1 10*3/uL (ref 0.7–4.0)
MCHC: 33.1 g/dL (ref 30.0–36.0)
MCV: 94.5 fl (ref 78.0–100.0)
Monocytes Absolute: 0.4 10*3/uL (ref 0.1–1.0)
Monocytes Relative: 7 % (ref 3.0–12.0)
Neutro Abs: 3.7 10*3/uL (ref 1.4–7.7)
Neutrophils Relative %: 69.6 % (ref 43.0–77.0)
Platelets: 178 10*3/uL (ref 150.0–400.0)
RBC: 4.4 Mil/uL (ref 4.22–5.81)
RDW: 13.6 % (ref 11.5–15.5)
WBC: 5.3 10*3/uL (ref 4.0–10.5)

## 2023-11-11 LAB — BASIC METABOLIC PANEL WITH GFR
BUN: 22 mg/dL (ref 6–23)
CO2: 24 meq/L (ref 19–32)
Calcium: 9.5 mg/dL (ref 8.4–10.5)
Chloride: 112 meq/L (ref 96–112)
Creatinine, Ser: 1.73 mg/dL — ABNORMAL HIGH (ref 0.40–1.50)
GFR: 38.98 mL/min — ABNORMAL LOW (ref >60.00)
Glucose, Bld: 91 mg/dL (ref 70–99)
Potassium: 4.8 meq/L (ref 3.5–5.1)
Sodium: 143 meq/L (ref 135–145)

## 2023-11-11 LAB — T3, FREE: T3, Free: 3.8 pg/mL (ref 2.3–4.2)

## 2023-11-11 LAB — T4, FREE: Free T4: 0.66 ng/dL (ref 0.60–1.60)

## 2023-11-11 LAB — VITAMIN B12: Vitamin B-12: 299 pg/mL (ref 211–911)

## 2023-11-11 LAB — TSH: TSH: 1.62 u[IU]/mL (ref 0.35–5.50)

## 2023-11-11 LAB — HEMOGLOBIN A1C: Hgb A1c MFr Bld: 5.3 % (ref 4.6–6.5)

## 2023-11-11 NOTE — Progress Notes (Signed)
   Subjective:    Patient ID: Joseph Tran, male    DOB: 1950-08-20, 73 y.o.   MRN: [id number]  HPI Here for 5 months of generalized weakness and fatigue. No major heath changes, no recent medication changes. He sleeps well, and he uses his CPAP every night.    Review of Systems  Constitutional:  Positive for fatigue.  Respiratory: Negative.    Cardiovascular: Negative.   Gastrointestinal: Negative.   Genitourinary: Negative.   Neurological:  Positive for weakness.       Objective:   Physical Exam Constitutional:      Appearance: Normal appearance.  Cardiovascular:     Rate and Rhythm: Normal rate. Rhythm irregular.     Pulses: Normal pulses.     Heart sounds: Normal heart sounds.  Pulmonary:     Effort: Pulmonary effort is normal.     Breath sounds: Normal breath sounds.  Neurological:     Mental Status: He is alert.           Assessment & Plan:  Fatigue of uncertain etiology. We will check labs to investigate this.  Corita Diego, MD

## 2023-11-21 ENCOUNTER — Other Ambulatory Visit: Payer: Self-pay | Admitting: Family Medicine

## 2023-11-22 ENCOUNTER — Ambulatory Visit: Payer: Self-pay | Admitting: Family Medicine

## 2023-11-29 ENCOUNTER — Ambulatory Visit: Admitting: Internal Medicine

## 2023-11-29 ENCOUNTER — Encounter: Payer: Self-pay | Admitting: Internal Medicine

## 2023-11-29 VITALS — BP 118/88 | HR 64 | Temp 97.8°F | Resp 15 | Ht 72.0 in | Wt 269.0 lb

## 2023-11-29 DIAGNOSIS — Z860100 Personal history of colon polyps, unspecified: Secondary | ICD-10-CM | POA: Diagnosis not present

## 2023-11-29 DIAGNOSIS — I10 Essential (primary) hypertension: Secondary | ICD-10-CM | POA: Diagnosis not present

## 2023-11-29 DIAGNOSIS — D122 Benign neoplasm of ascending colon: Secondary | ICD-10-CM | POA: Diagnosis not present

## 2023-11-29 DIAGNOSIS — Z860101 Personal history of adenomatous and serrated colon polyps: Secondary | ICD-10-CM

## 2023-11-29 DIAGNOSIS — I4891 Unspecified atrial fibrillation: Secondary | ICD-10-CM | POA: Diagnosis not present

## 2023-11-29 DIAGNOSIS — Z1211 Encounter for screening for malignant neoplasm of colon: Secondary | ICD-10-CM | POA: Diagnosis not present

## 2023-11-29 DIAGNOSIS — K5730 Diverticulosis of large intestine without perforation or abscess without bleeding: Secondary | ICD-10-CM | POA: Diagnosis not present

## 2023-11-29 DIAGNOSIS — F419 Anxiety disorder, unspecified: Secondary | ICD-10-CM | POA: Diagnosis not present

## 2023-11-29 DIAGNOSIS — G4733 Obstructive sleep apnea (adult) (pediatric): Secondary | ICD-10-CM | POA: Diagnosis not present

## 2023-11-29 MED ORDER — SODIUM CHLORIDE 0.9 % IV SOLN: 500.0000 mL | INTRAVENOUS | Status: DC

## 2023-11-29 NOTE — Progress Notes (Signed)
 GASTROENTEROLOGY PROCEDURE H&P NOTE   Primary Care Physician: Donley Furth, MD    Reason for Procedure:   Hx of polyps  Plan:    colonoscopy  Patient is appropriate for endoscopic procedure(s) in the ambulatory ([facility]) setting.  The nature of the procedure, as well as the risks, benefits, and alternatives were carefully and thoroughly reviewed with the patient. Ample time for discussion and questions allowed. The patient understood, was satisfied, and agreed to proceed.     HPI: Joseph Tran is a 73 y.o. male who presents for colonoscopy.  Medical history as below.  Tolerated the prep.  No recent chest pain or shortness of breath.  No abdominal pain today.  Xarlto on hold  x 2 days.  Past Medical History:  Diagnosis Date   Acoustic neuroma (HCC)    benign - left   Allergy    Anxiety    Arthritis    Asthma    seasonal, when pollen is high, cold air closes me up   Atrial fibrillation (HCC)    Cancer (HCC)    skin cancer -- arm, scalp, upper back   Chronic kidney disease    sees Dr. 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  right kidny is at 50%   Clotting disorder (HCC)    Hx DVT - Xarelto    Colon polyps    Complication of anesthesia    Post op nausea/vomiting   Depression    severe.  dx 1985   DVT (deep venous thrombosis) (HCC)    sees Dr. Gray Layman    History of kidney stones    has kidney stone now.     Hypertension    Hypogonadism male    sees Dr. Terese Fendt at [facility]   Neuropathy of both feet    OSA (obstructive sleep apnea)    Plantar fasciitis    rt foot   Pneumonia    PONV (postoperative nausea and vomiting)    Renal atrophy, left    sees Dr. Terese Fendt at [facility]   Sleep apnea    tested 2010  - wears c-pap    Past Surgical History:  Procedure Laterality Date   ATRIAL FIBRILLATION ABLATION N/A 06/17/2023   Procedure: ATRIAL FIBRILLATION ABLATION;  Surgeon: Ardeen Kohler, MD;  Location:  [facility] INVASIVE CV LAB;  Service: Cardiovascular;  Laterality: N/A;   CARDIOVERSION N/A 01/05/2023   Procedure: CARDIOVERSION;  Surgeon: Sheryle Donning, MD;  Location: [facility] INVASIVE CV LAB;  Service: Cardiovascular;  Laterality: N/A;   CHEST WALL TUMOR EXCISION  2007   COLONOSCOPY  04/07/2020   per Dr. Bridgett Camps, adenomatous polyps, repeat in 3 yrs    kidney caluculs  2004-05   KIDNEY STONE SURGERY     x 2c   KNEE ARTHROSCOPY  09/10/2011   right knee, per Dr. Neil Balls    KNEE ARTHROSCOPY Right 04/25/2017   Procedure: RIGHT KNEE ARTHROSCOPY, PARTIAL LATERAL MENISECTOMY, CHONDROPLASTY MEDIAL AND LATERAL PATELLAOFEMORAL;  Surgeon: Neil Balls, MD;  Location: [facility] OR;  Service: Orthopedics;  Laterality: Right;   KNEE ARTHROSCOPY Left 09/18/2021   Procedure: ARTHROSCOPY KNEE;  Surgeon: Neil Balls, MD;  Location: [facility] ORS;  Service: Orthopedics;  Laterality: Left;   KNEE ARTHROSCOPY WITH MEDIAL MENISECTOMY Left 09/18/2021   Procedure: KNEE ARTHROSCOPY WITH MEDIAL MENISECTOMY;  Surgeon: Neil Balls, MD;  Location: [facility] ORS;  Service: Orthopedics;  Laterality: Left;   madiscus cartilage  2009   MOHS SURGERY  REPLACEMENT TOTAL KNEE Left 09/2022   right knee arthroscopy  2008   TONSILLECTOMY     TOTAL KNEE ARTHROPLASTY Right 01/19/2019   Procedure: RIGHT TOTAL KNEE ARTHROPLASTY;  Surgeon: Neil Balls, MD;  Location: [facility] ORS;  Service: Orthopedics;  Laterality: Right;   TOTAL KNEE ARTHROPLASTY Left 06/14/2022   Procedure: TOTAL KNEE ARTHROPLASTY;  Surgeon: Neil Balls, MD;  Location: [facility] ORS;  Service: Orthopedics;  Laterality: Left;   UPPER GASTROINTESTINAL ENDOSCOPY      Prior to Admission medications   Medication Sig Start Date End Date Taking? Authorizing Provider  acetaminophen  (TYLENOL ) 500 MG tablet Take 1,000 mg by mouth as needed for moderate pain (pain score 4-6) or mild pain (pain score 1-3).   Yes [provider]  albuterol  (PROAIR  HFA) 108 (90 Base) MCG/ACT inhaler  INHALE 2 PUFFS EVERY 4  HOURS AS NEEDED FOR  WHEEZING OR SHORTNESS OF  BREATH 07/21/21  Yes Donley Furth, MD  Apple Cider Vinegar 600 MG CAPS Take 600 mg by mouth at bedtime.   Yes [provider]  b complex vitamins capsule Take 1 capsule by mouth at bedtime. With vit B12   Yes [provider]  bisacodyl  (DULCOLAX) 5 MG EC tablet Take 5 mg by mouth at bedtime.   Yes [provider]  buPROPion  (WELLBUTRIN  XL) 300 MG 24 hr tablet TAKE 1 TABLET BY MOUTH DAILY 11/15/22  Yes Donley Furth, MD  carvedilol  (COREG ) 12.5 MG tablet Take 1 tablet (12.5 mg total) by mouth 2 (two) times daily. 09/19/23  Yes Elmyra Haggard, MD  clonazePAM  (KLONOPIN ) 1 MG tablet TAKE 1 TABLET(1 MG) BY MOUTH TWICE DAILY AS NEEDED FOR ANXIETY 10/25/23  Yes Donley Furth, MD  desvenlafaxine  (PRISTIQ ) 50 MG 24 hr tablet TAKE 1 TABLET BY MOUTH DAILY 11/22/23  Yes Donley Furth, MD  furosemide  (LASIX ) 20 MG tablet TAKE 1 TABLET BY MOUTH  DAILY AS NEEDED 06/17/21  Yes Ennever, Sherryll Donald, MD  hydrALAZINE  (APRESOLINE ) 50 MG tablet Take 1 tablet (50 mg total) by mouth 3 (three) times daily. 03/08/23 11/29/23 Yes Elmyra Haggard, MD  rosuvastatin  (CRESTOR ) 10 MG tablet Take 1 tablet (10 mg total) by mouth daily. 05/04/23  Yes Elmyra Haggard, MD  tamsulosin  (FLOMAX ) 0.4 MG CAPS capsule TAKE 1 CAPSULE BY MOUTH DAILY 08/22/23  Yes Donley Furth, MD  Cholecalciferol  50 MCG (2000 UT) TABS Take 2,000 Units by mouth at bedtime.    [provider]  clindamycin  (CLEOCIN ) 300 MG capsule Take 600 mg by mouth See admin instructions. Take 600 mg 1 hour prior to dental work 08/18/20   [provider]  Hypromellose (GENTEAL MILD OP) Place 1 drop into both eyes daily.    [provider]  senna (SENOKOT) 8.6 MG TABS tablet Take 1 tablet by mouth at bedtime as needed for mild constipation. Vegetable    [provider]  XARELTO  20 MG TABS tablet TAKE 1 TABLET BY MOUTH DAILY  WITH SUPPER 06/01/23   Donley Furth, MD    Current Outpatient Medications  Medication Sig Dispense Refill   acetaminophen  (TYLENOL ) 500 MG tablet Take 1,000 mg by mouth as needed for moderate pain (pain score 4-6) or mild pain (pain score 1-3).     albuterol  (PROAIR  HFA) 108 (90 Base) MCG/ACT inhaler INHALE 2 PUFFS EVERY 4  HOURS AS NEEDED FOR  WHEEZING OR SHORTNESS OF  BREATH 51 g 3   Apple Cider Vinegar 600 MG CAPS  Take 600 mg by mouth at bedtime.     b complex vitamins capsule Take 1 capsule by mouth at bedtime. With vit B12     bisacodyl  (DULCOLAX) 5 MG EC tablet Take 5 mg by mouth at bedtime.     buPROPion  (WELLBUTRIN  XL) 300 MG 24 hr tablet TAKE 1 TABLET BY MOUTH DAILY 90 tablet 3   carvedilol  (COREG ) 12.5 MG tablet Take 1 tablet (12.5 mg total) by mouth 2 (two) times daily. 180 tablet 2   clonazePAM  (KLONOPIN ) 1 MG tablet TAKE 1 TABLET(1 MG) BY MOUTH TWICE DAILY AS NEEDED FOR ANXIETY 180 tablet 1   desvenlafaxine  (PRISTIQ ) 50 MG 24 hr tablet TAKE 1 TABLET BY MOUTH DAILY 90 tablet 3   furosemide  (LASIX ) 20 MG tablet TAKE 1 TABLET BY MOUTH  DAILY AS NEEDED 90 tablet 3   hydrALAZINE  (APRESOLINE ) 50 MG tablet Take 1 tablet (50 mg total) by mouth 3 (three) times daily. 270 tablet 3   rosuvastatin  (CRESTOR ) 10 MG tablet Take 1 tablet (10 mg total) by mouth daily. 90 tablet 3   tamsulosin  (FLOMAX ) 0.4 MG CAPS capsule TAKE 1 CAPSULE BY MOUTH DAILY 90 capsule 3   Cholecalciferol  50 MCG (2000 UT) TABS Take 2,000 Units by mouth at bedtime.     clindamycin  (CLEOCIN ) 300 MG capsule Take 600 mg by mouth See admin instructions. Take 600 mg 1 hour prior to dental work     Hypromellose (GENTEAL MILD OP) Place 1 drop into both eyes daily.     senna (SENOKOT) 8.6 MG TABS tablet Take 1 tablet by mouth at bedtime as needed for mild constipation. Vegetable     XARELTO  20 MG TABS tablet TAKE 1 TABLET BY MOUTH DAILY  WITH SUPPER 90 tablet 3   Current Facility-Administered Medications  Medication Dose Route Frequency Provider Last  Rate Last Admin   0.9 %  sodium chloride  infusion  500 mL Intravenous Continuous Tamme Mozingo, Amber Bail, MD        Allergies as of 11/29/2023 - Review Complete 11/29/2023  Allergen Reaction Noted   Allopurinol Other (See Comments) 04/07/2010   Penicillins Hives 04/07/2010   Sulfamethoxazole Hives 08/02/2015   Sulfonamide derivatives Hives 04/07/2010   Dilaudid  [hydromorphone  hcl] Nausea And Vomiting 08/05/2015    Family History  Problem Relation Age of Onset   Heart disease Father    Throat cancer Maternal Grandmother    Liver cancer Paternal Grandmother    Heart disease Other        parents   Hyperlipidemia Other    Stroke Other        grandparents   Sudden death Other        uncle less than 82 yrs old   Colon cancer Neg Hx    Esophageal cancer Neg Hx    Rectal cancer Neg Hx    Stomach cancer Neg Hx     Social History   Socioeconomic History   Marital status: Single    Spouse name: Not on file   Number of children: 0   Years of education: Not on file   Highest education level: Bachelor's degree (e.g., BA, AB, BS)  Occupational History   Occupation: retired  Tobacco Use   Smoking status: Never   Smokeless tobacco: Never   Tobacco comments:    Never smoked 04/01/23  Vaping Use   Vaping status: Never Used  Substance and Sexual Activity   Alcohol use: Never   Drug use: Never   Sexual activity: Not Currently  Other Topics Concern   Not on file  Social History Narrative   Not on file   Social Drivers of Health   Financial Resource Strain: Low Risk  (11/10/2023)   Overall Financial Resource Strain (CARDIA)    Difficulty of Paying Living Expenses: Not hard at all  Food Insecurity: No Food Insecurity (11/10/2023)   Hunger Vital Sign    Worried About Running Out of Food in the Last Year: Never true    Ran Out of Food in the Last Year: Never true  Transportation Needs: No Transportation Needs (11/10/2023)   PRAPARE - Administrator, Civil Service (Medical): No     Lack of Transportation (Non-Medical): No  Physical Activity: Sufficiently Active (11/10/2023)   Exercise Vital Sign    Days of Exercise per Week: 6 days    Minutes of Exercise per Session: 60 min  Stress: No Stress Concern Present (11/10/2023)   [employer] of Occupational Health - Occupational Stress Questionnaire    Feeling of Stress : Only a little  Social Connections: Socially Isolated (11/10/2023)   Social Connection and Isolation Panel [NHANES]    Frequency of Communication with Friends and Family: Once a week    Frequency of Social Gatherings with Friends and Family: Once a week    Attends Religious Services: Never    Database administrator or Organizations: No    Attends Banker Meetings: Never    Marital Status: Never married  Intimate Partner Violence: Not At Risk (06/17/2023)   Humiliation, Afraid, Rape, and Kick questionnaire    Fear of Current or Ex-Partner: No    Emotionally Abused: No    Physically Abused: No    Sexually Abused: No    Physical Exam: Vital signs in last 24 hours: @BP  (!) 150/88   Pulse 64   Temp 97.8 F (36.6 C)   Resp (!) 6   Ht 6' (1.829 m)   Wt 269 lb (122 kg)   SpO2 99%   BMI 36.48 kg/m  GEN: NAD EYE: Sclerae anicteric ENT: MMM CV: Non-tachycardic Pulm: CTA b/l GI: Soft, NT/ND NEURO:  Alert & Oriented x 3   Laurell Pond, MD [facility]  11/29/2023 1:28 PM

## 2023-11-29 NOTE — Patient Instructions (Signed)
 Restart Xarelto  tomorrow.  YOU HAD AN ENDOSCOPIC PROCEDURE TODAY AT THE [facility]:   Refer to the procedure report that was given to you for any specific questions about what was found during the examination.  If the procedure report does not answer your questions, please call your gastroenterologist to clarify.  If you requested that your care partner not be given the details of your procedure findings, then the procedure report has been included in a sealed envelope for you to review at your convenience later.  YOU SHOULD EXPECT: Some feelings of bloating in the abdomen. Passage of more gas than usual.  Walking can help get rid of the air that was put into your GI tract during the procedure and reduce the bloating. If you had a lower endoscopy (such as a colonoscopy or flexible sigmoidoscopy) you may notice spotting of blood in your stool or on the toilet paper. If you underwent a bowel prep for your procedure, you may not have a normal bowel movement for a few days.  Please Note:  You might notice some irritation and congestion in your nose or some drainage.  This is from the oxygen used during your procedure.  There is no need for concern and it should clear up in a day or so.  SYMPTOMS TO REPORT IMMEDIATELY:  Following lower endoscopy (colonoscopy or flexible sigmoidoscopy):  Excessive amounts of blood in the stool  Significant tenderness or worsening of abdominal pains  Swelling of the abdomen that is new, acute  Fever of 100F or higher  For urgent or emergent issues, a gastroenterologist can be reached at any hour by calling [phone]. Do not use MyChart messaging for urgent concerns.    DIET:  We do recommend a small meal at first, but then you may proceed to your regular diet.  Drink plenty of fluids but you should avoid alcoholic beverages for 24 hours.  ACTIVITY:  You should plan to take it easy for the rest of today and you should NOT DRIVE or use heavy  machinery until tomorrow (because of the sedation medicines used during the test).    FOLLOW UP: Our staff will call the number listed on your records the next business day following your procedure.  We will call around 7:15- 8:00 am to check on you and address any questions or concerns that you may have regarding the information given to you following your procedure. If we do not reach you, we will leave a message.     If any biopsies were taken you will be contacted by phone or by letter within the next 1-3 weeks.  Please call us  at [phone] if you have not heard about the biopsies in 3 weeks.    SIGNATURES/CONFIDENTIALITY: You and/or your care partner have signed paperwork which will be entered into your electronic medical record.  These signatures attest to the fact that that the information above on your After Visit Summary has been reviewed and is understood.  Full responsibility of the confidentiality of this discharge information lies with you and/or your care-partner.

## 2023-11-29 NOTE — Progress Notes (Signed)
 Pt's states no medical or surgical changes since previsit or office visit.

## 2023-11-29 NOTE — Progress Notes (Signed)
 Sedate, gd SR, tolerated procedure well, VSS, report to RN

## 2023-11-29 NOTE — Op Note (Signed)
 [facility] Patient Name: Joseph Tran Procedure Date: 11/29/2023 1:23 PM MRN: [id number] Endoscopist: Nannette Babe , MD, [id number] Age: 73 Referring MD:  Date of Birth: 12/22/1950 Gender: Male Account #: [id number] Procedure:                Colonoscopy Indications:              High risk colon cancer surveillance: Personal                            history of multiple adenomas, Last colonoscopy:                            October 2021 (TA x 4) Medicines:                Monitored Anesthesia Care Procedure:                Pre-Anesthesia Assessment:                           - Prior to the procedure, a History and Physical                            was performed, and patient medications and                            allergies were reviewed. The patient's tolerance of                            previous anesthesia was also reviewed. The risks                            and benefits of the procedure and the sedation                            options and risks were discussed with the patient.                            All questions were answered, and informed consent                            was obtained. Prior Anticoagulants: The patient has                            taken Xarelto  (rivaroxaban ), last dose was 2 days                            prior to procedure. ASA Grade Assessment: III - A                            patient with severe systemic disease. After                            reviewing the risks and benefits, the patient was  deemed in satisfactory condition to undergo the                            procedure.                           After obtaining informed consent, the colonoscope                            was passed under direct vision. Throughout the                            procedure, the patient's blood pressure, pulse, and                            oxygen saturations were monitored continuously. The                             Olympus Scope SN: [id number] was introduced through                            the anus and advanced to the cecum, identified by                            transillumination. The colonoscopy was performed                            without difficulty. The patient tolerated the                            procedure well. The quality of the bowel                            preparation was adequate (after irrigation and                            lavage). The ileocecal valve, appendiceal orifice,                            and rectum were photographed. Scope In: 1:34:39 PM Scope Out: 1:52:48 PM Scope Withdrawal Time: 0 hours 14 minutes 44 seconds  Total Procedure Duration: 0 hours 18 minutes 9 seconds  Findings:                 The digital rectal exam was normal.                           Three sessile polyps were found in the ascending                            colon. The polyps were 4 to 6 mm in size. These                            polyps were removed with a cold snare. Resection  and retrieval were complete.                           Multiple small-mouthed diverticula were found in                            the sigmoid colon.                           The retroflexed view of the distal rectum and anal                            verge was normal and showed no anal or rectal                            abnormalities. Complications:            No immediate complications. Estimated Blood Loss:     Estimated blood loss: none. Impression:               - Three 4 to 6 mm polyps in the ascending colon,                            removed with a cold snare. Resected and retrieved.                           - Mild diverticulosis in the sigmoid colon.                           - The distal rectum and anal verge are normal on                            retroflexion view. Recommendation:           - Patient has a contact number available for                             emergencies. The signs and symptoms of potential                            delayed complications were discussed with the                            patient. Return to normal activities tomorrow.                            Written discharge instructions were provided to the                            patient.                           - Resume previous diet.                           - Continue present medications.                           -  Resume Xarelto  (rivaroxaban ) at prior dose                            tomorrow. Refer to managing physician for further                            adjustment of therapy.                           - Await pathology results.                           - Repeat colonoscopy is recommended for                            surveillance. The colonoscopy date will be                            determined after pathology results from today's                            exam become available for review. Nannette Babe, MD 11/29/2023 1:58:35 PM This report has been signed electronically.

## 2023-11-30 ENCOUNTER — Telehealth: Payer: Self-pay

## 2023-11-30 NOTE — Telephone Encounter (Signed)
  Follow up Call-     11/29/2023   12:56 PM  Call back number  Post procedure Call Back phone  # [phone]  Permission to leave phone message Yes     Patient questions:  Do you have a fever, pain , or abdominal swelling? No. Pain Score  0 *  Have you tolerated food without any problems? Yes.    Have you been able to return to your normal activities? Yes.    Do you have any questions about your discharge instructions: Diet   No. Medications  No. Follow up visit  No.  Do you have questions or concerns about your Care? No.  Actions: * If pain score is 4 or above: No action needed, pain <4.

## 2023-12-02 LAB — SURGICAL PATHOLOGY

## 2023-12-06 ENCOUNTER — Ambulatory Visit: Payer: Self-pay | Admitting: Internal Medicine

## 2023-12-06 ENCOUNTER — Other Ambulatory Visit: Payer: Self-pay | Admitting: Family Medicine

## 2024-02-19 ENCOUNTER — Other Ambulatory Visit: Payer: Self-pay | Admitting: Internal Medicine

## 2024-03-08 DIAGNOSIS — Z23 Encounter for immunization: Secondary | ICD-10-CM | POA: Diagnosis not present

## 2024-03-23 DIAGNOSIS — Z23 Encounter for immunization: Secondary | ICD-10-CM | POA: Diagnosis not present

## 2024-04-15 ENCOUNTER — Other Ambulatory Visit: Payer: Self-pay | Admitting: Internal Medicine

## 2024-04-15 DIAGNOSIS — E785 Hyperlipidemia, unspecified: Secondary | ICD-10-CM

## 2024-04-15 DIAGNOSIS — Z79899 Other long term (current) drug therapy: Secondary | ICD-10-CM

## 2024-04-15 DIAGNOSIS — I4819 Other persistent atrial fibrillation: Secondary | ICD-10-CM

## 2024-04-19 DIAGNOSIS — L249 Irritant contact dermatitis, unspecified cause: Secondary | ICD-10-CM | POA: Diagnosis not present

## 2024-04-19 DIAGNOSIS — L0101 Non-bullous impetigo: Secondary | ICD-10-CM | POA: Diagnosis not present

## 2024-04-23 ENCOUNTER — Ambulatory Visit

## 2024-04-23 VITALS — BP 120/60 | HR 76 | Temp 98.1°F | Ht 72.0 in | Wt 266.1 lb

## 2024-04-23 DIAGNOSIS — Z0000 Encounter for general adult medical examination without abnormal findings: Secondary | ICD-10-CM

## 2024-04-23 NOTE — Patient Instructions (Addendum)
 [name],  Thank you for taking the time for your Medicare Wellness Visit. I appreciate your continued commitment to your health goals. Please review the care plan we discussed, and feel free to reach out if I can assist you further.  Medicare recommends these wellness visits once per year to help you and your care team stay ahead of potential health issues. These visits are designed to focus on prevention, allowing your provider to concentrate on managing your acute and chronic conditions during your regular appointments.  Please note that Annual Wellness Visits do not include a physical exam. Some assessments may be limited, especially if the visit was conducted virtually. If needed, we may recommend a separate in-person follow-up with your provider.  Ongoing Care Seeing your primary care provider every 3 to 6 months helps us  monitor your health and provide consistent, personalized care.   Referrals If a referral was made during today's visit and you haven't received any updates within two weeks, please contact the referred provider directly to check on the status.  Recommended Screenings:  Health Maintenance  Topic Date Due   Hepatitis C Screening  Never done   Flu Shot  02/03/2024   COVID-19 Vaccine (8 - 2025-26 season) 03/05/2024   Medicare Annual Wellness Visit  04/23/2025   DTaP/Tdap/Td vaccine (3 - Td or Tdap) 08/01/2025   Colon Cancer Screening  11/29/2026   Pneumococcal Vaccine for age over 69  Completed   Zoster (Shingles) Vaccine  Completed   Meningitis B Vaccine  Aged Out       04/23/2024    3:13 PM  Advanced Directives  Does Patient Have a Medical Advance Directive? Yes  Type of Estate agent of [place];Living will  Does patient want to make changes to medical advance directive? No - Patient declined  Copy of Healthcare Power of Attorney in Chart? Yes - validated most recent copy scanned in chart (See row information)   Advance Care Planning  is important because it: Ensures you receive medical care that aligns with your values, goals, and preferences. Provides guidance to your family and loved ones, reducing the emotional burden of decision-making during critical moments.  Vision: Annual vision screenings are recommended for early detection of glaucoma, cataracts, and diabetic retinopathy. These exams can also reveal signs of chronic conditions such as diabetes and high blood pressure.  Dental: Annual dental screenings help detect early signs of oral cancer, gum disease, and other conditions linked to overall health, including heart disease and diabetes.  Please see the attached documents for additional preventive care recommendations.

## 2024-04-23 NOTE — Progress Notes (Signed)
 Subjective:   Joseph Tran is a 73 y.o. who presents for a Medicare Wellness preventive visit.  As a reminder, Annual Wellness Visits don't include a physical exam, and some assessments may be limited, especially if this visit is performed virtually. We may recommend an in-person follow-up visit with your provider if needed.  Visit Complete: In person    Persons Participating in Visit: Patient.  AWV Questionnaire: No: Patient Medicare AWV questionnaire was not completed prior to this visit.  Cardiac Risk Factors include: advanced age (>39men, >28 women);hypertension     Objective:    Today's Vitals   04/23/24 1452  BP: 120/60  Pulse: 76  Temp: 98.1 F (36.7 C)  TempSrc: Oral  SpO2: 96%  Weight: 266 lb 1.6 oz (120.7 kg)  Height: 6' (1.829 m)   Body mass index is 36.09 kg/m.     04/23/2024    3:13 PM 06/18/2023   11:00 AM 06/17/2023    8:44 AM 01/05/2023    1:24 PM 12/15/2022    8:51 AM 12/03/2022   11:16 AM 07/14/2022   10:34 AM  Advanced Directives  Does Patient Have a Medical Advance Directive? Yes Yes Yes Yes Yes Yes Yes  Type of Estate agent of [place];Living will  Living will Healthcare Power of [place];Living will Healthcare Power of [place];Living will Healthcare Power of [place];Living will Healthcare Power of [place];Living will  Does patient want to make changes to medical advance directive? No - Patient declined Yes (Inpatient - patient defers changing a medical advance directive and declines information at this time)  No - Guardian declined   No - Guardian declined  Copy of Healthcare Power of Attorney in Chart? Yes - validated most recent copy scanned in chart (See row information)   No - copy requested Yes - validated most recent copy scanned in chart (See row information) Yes - validated most recent copy scanned in chart (See row information) Yes - validated most recent copy scanned in chart (See row information)    Current  Medications (verified) Outpatient Encounter Medications as of 04/23/2024  Medication Sig   acetaminophen  (TYLENOL ) 500 MG tablet Take 1,000 mg by mouth as needed for moderate pain (pain score 4-6) or mild pain (pain score 1-3).   albuterol  (PROAIR  HFA) 108 (90 Base) MCG/ACT inhaler INHALE 2 PUFFS EVERY 4  HOURS AS NEEDED FOR  WHEEZING OR SHORTNESS OF  BREATH   Apple Cider Vinegar 600 MG CAPS Take 600 mg by mouth at bedtime.   b complex vitamins capsule Take 1 capsule by mouth at bedtime. With vit B12   bisacodyl  (DULCOLAX) 5 MG EC tablet Take 5 mg by mouth at bedtime.   buPROPion  (WELLBUTRIN  XL) 300 MG 24 hr tablet TAKE 1 TABLET BY MOUTH DAILY   carvedilol  (COREG ) 12.5 MG tablet Take 1 tablet (12.5 mg total) by mouth 2 (two) times daily.   Cholecalciferol  50 MCG (2000 UT) TABS Take 2,000 Units by mouth at bedtime.   clindamycin  (CLEOCIN ) 300 MG capsule Take 600 mg by mouth See admin instructions. Take 600 mg 1 hour prior to dental work   clonazePAM  (KLONOPIN ) 1 MG tablet TAKE 1 TABLET(1 MG) BY MOUTH TWICE DAILY AS NEEDED FOR ANXIETY   desvenlafaxine  (PRISTIQ ) 50 MG 24 hr tablet TAKE 1 TABLET BY MOUTH DAILY   furosemide  (LASIX ) 20 MG tablet TAKE 1 TABLET BY MOUTH  DAILY AS NEEDED   hydrALAZINE  (APRESOLINE ) 50 MG tablet TAKE 1 TABLET BY MOUTH 3 TIMES  DAILY   Hypromellose (GENTEAL MILD OP) Place 1 drop into both eyes daily.   rosuvastatin  (CRESTOR ) 10 MG tablet TAKE 1 TABLET BY MOUTH DAILY   senna (SENOKOT) 8.6 MG TABS tablet Take 1 tablet by mouth at bedtime as needed for mild constipation. Vegetable   tamsulosin  (FLOMAX ) 0.4 MG CAPS capsule TAKE 1 CAPSULE BY MOUTH DAILY   XARELTO  20 MG TABS tablet TAKE 1 TABLET BY MOUTH DAILY  WITH SUPPER   No facility-administered encounter medications on file as of 04/23/2024.    Allergies (verified) Allopurinol, Penicillins, Sulfamethoxazole, Sulfonamide derivatives, and Dilaudid  [hydromorphone  hcl]   History: Past Medical History:  Diagnosis Date    Acoustic neuroma (HCC)    benign - left   Allergy    Anxiety    Arthritis    Asthma    seasonal, when pollen is high, cold air closes me up   Atrial fibrillation (HCC)    Cancer (HCC)    skin cancer -- arm, scalp, upper back   Chronic kidney disease    sees Dr. 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  right kidny is at 50%   Clotting disorder    Hx DVT - Xarelto    Colon polyps    Complication of anesthesia    Post op nausea/vomiting   Depression    severe.  dx 1985   DVT (deep venous thrombosis) (HCC)    sees Dr. Maude Crease    History of kidney stones    has kidney stone now.     Hypertension    Hypogonadism male    sees Dr. Redell Napoleon at [facility]   Neuropathy of both feet    OSA (obstructive sleep apnea)    Plantar fasciitis    rt foot   Pneumonia    PONV (postoperative nausea and vomiting)    Renal atrophy, left    sees Dr. Redell Napoleon at [facility]   Sleep apnea    tested 2010  - wears c-pap   Past Surgical History:  Procedure Laterality Date   ATRIAL FIBRILLATION ABLATION N/A 06/17/2023   Procedure: ATRIAL FIBRILLATION ABLATION;  Surgeon: Kennyth Chew, MD;  Location: [facility] INVASIVE CV LAB;  Service: Cardiovascular;  Laterality: N/A;   CARDIOVERSION N/A 01/05/2023   Procedure: CARDIOVERSION;  Surgeon: Lonni Slain, MD;  Location: [facility] INVASIVE CV LAB;  Service: Cardiovascular;  Laterality: N/A;   CHEST WALL TUMOR EXCISION  2007   COLONOSCOPY  04/07/2020   per Dr. Albertus, adenomatous polyps, repeat in 3 yrs    kidney caluculs  2004-05   KIDNEY STONE SURGERY     x 2c   KNEE ARTHROSCOPY  09/10/2011   right knee, per Dr. Norleen Gavel    KNEE ARTHROSCOPY Right 04/25/2017   Procedure: RIGHT KNEE ARTHROSCOPY, PARTIAL LATERAL MENISECTOMY, CHONDROPLASTY MEDIAL AND LATERAL PATELLAOFEMORAL;  Surgeon: Gavel Norleen, MD;  Location: [facility] OR;  Service: Orthopedics;  Laterality: Right;   KNEE ARTHROSCOPY Left 09/18/2021    Procedure: ARTHROSCOPY KNEE;  Surgeon: Gavel Norleen, MD;  Location: [facility] ORS;  Service: Orthopedics;  Laterality: Left;   KNEE ARTHROSCOPY WITH MEDIAL MENISECTOMY Left 09/18/2021   Procedure: KNEE ARTHROSCOPY WITH MEDIAL MENISECTOMY;  Surgeon: Gavel Norleen, MD;  Location: [facility] ORS;  Service: Orthopedics;  Laterality: Left;   madiscus cartilage  2009   MOHS SURGERY     REPLACEMENT TOTAL KNEE Left 09/2022   right knee arthroscopy  2008   TONSILLECTOMY     TOTAL KNEE ARTHROPLASTY Right  01/19/2019   Procedure: RIGHT TOTAL KNEE ARTHROPLASTY;  Surgeon: Yvone Rush, MD;  Location: [facility];  Service: Orthopedics;  Laterality: Right;   TOTAL KNEE ARTHROPLASTY Left 06/14/2022   Procedure: TOTAL KNEE ARTHROPLASTY;  Surgeon: Yvone Rush, MD;  Location: [facility];  Service: Orthopedics;  Laterality: Left;   UPPER GASTROINTESTINAL ENDOSCOPY     Family History  Problem Relation Age of Onset   Heart disease Father    Throat cancer Maternal Grandmother    Liver cancer Paternal Grandmother    Heart disease Other        parents   Hyperlipidemia Other    Stroke Other        grandparents   Sudden death Other        uncle less than 60 yrs old   Colon cancer Neg Hx    Esophageal cancer Neg Hx    Rectal cancer Neg Hx    Stomach cancer Neg Hx    Social History   Socioeconomic History   Marital status: Single    Spouse name: Not on file   Number of children: 0   Years of education: Not on file   Highest education level: Bachelor's degree (e.g., BA, AB, BS)  Occupational History   Occupation: retired  Tobacco Use   Smoking status: Never   Smokeless tobacco: Never   Tobacco comments:    Never smoked 04/01/23  Vaping Use   Vaping status: Never Used  Substance and Sexual Activity   Alcohol use: Never   Drug use: Never   Sexual activity: Not Currently  Other Topics Concern   Not on file  Social History Narrative   Not on file   Social Drivers of Health   Financial Resource Strain: Low Risk   (04/23/2024)   Overall Financial Resource Strain (CARDIA)    Difficulty of Paying Living Expenses: Not hard at all  Food Insecurity: No Food Insecurity (04/23/2024)   Hunger Vital Sign    Worried About Running Out of Food in the Last Year: Never true    Ran Out of Food in the Last Year: Never true  Transportation Needs: No Transportation Needs (04/23/2024)   PRAPARE - Administrator, Civil Service (Medical): No    Lack of Transportation (Non-Medical): No  Physical Activity: Inactive (04/23/2024)   Exercise Vital Sign    Days of Exercise per Week: 0 days    Minutes of Exercise per Session: 0 min  Stress: No Stress Concern Present (04/23/2024)   [employer] of Occupational Health - Occupational Stress Questionnaire    Feeling of Stress: Not at all  Social Connections: Socially Isolated (04/23/2024)   Social Connection and Isolation Panel    Frequency of Communication with Friends and Family: More than three times a week    Frequency of Social Gatherings with Friends and Family: More than three times a week    Attends Religious Services: Never    Database administrator or Organizations: No    Attends Banker Meetings: Never    Marital Status: Never married    Tobacco Counseling Counseling given: Not Answered Tobacco comments: Never smoked 04/01/23    Clinical Intake:  Pre-visit preparation completed: Yes  Pain : No/denies pain     BMI - recorded: 36.09 Nutritional Status: BMI > 30  Obese Nutritional Risks: None Diabetes: No  Lab Results  Component Value Date   HGBA1C 5.3 11/11/2023   HGBA1C 5.2 02/02/2023   HGBA1C 5.4 07/21/2021  How often do you need to have someone help you when you read instructions, pamphlets, or other written materials from your doctor or pharmacy?: 1 - Never  Interpreter Needed?: No  Information entered by :: Rojelio Blush LPN   Activities of Daily Living     04/23/2024    3:11 PM 06/17/2023    4:00  PM  In your present state of health, do you have any difficulty performing the following activities:  Hearing? 0 0  Vision? 0 0  Difficulty concentrating or making decisions? 0 0  Walking or climbing stairs? 1   Comment Uses a Cane   Dressing or bathing? 0   Doing errands, shopping? 0   Preparing Food and eating ? N   Using the Toilet? N   In the past six months, have you accidently leaked urine? N   Do you have problems with loss of bowel control? N   Managing your Medications? N   Managing your Finances? N   Housekeeping or managing your Housekeeping? N     Patient Care Team: Johnny Garnette LABOR, MD as PCP - Diedre Okey Vina LULLA, MD as PCP - Cardiology (Cardiology) Kennyth Chew, MD as PCP - Electrophysiology (Cardiology) Timmy Maude SAUNDERS, MD as Consulting Physician (Oncology) Liane Sharyne MATSU, [facility] (Inactive) as Pharmacist (Pharmacist)  I have updated your Care Teams any recent Medical Services you may have received from other providers in the past year.     Assessment:   This is a routine wellness examination for Joseph Tran.  Hearing/Vision screen Hearing Screening - Comments:: Denies hearing difficulties   Vision Screening - Comments:: Wears rx glasses - up to date with [facility] eye exams with  [facility]   Goals Addressed               This Visit's Progress     Remain active (pt-stated)         Depression Screen     04/23/2024    2:54 PM 03/03/2023    2:44 PM 11/09/2022    2:43 PM 10/13/2022    3:19 PM 05/12/2022    2:28 PM 03/26/2022   10:12 AM 02/26/2022   11:22 AM  PHQ 2/9 Scores  PHQ - 2 Score 0 4 2 2 2 2 2   PHQ- 9 Score  13 8 7 8 9 9     Fall Risk     04/23/2024    3:12 PM 03/03/2023    2:43 PM 10/13/2022    3:18 PM 05/12/2022    2:25 PM 05/10/2022    5:04 PM  Fall Risk   Falls in the past year? 1 0 1 0 0  Number falls in past yr: 0 0 0 0   Injury with Fall? 0 0 1 0   Risk for fall due to : No Fall Risks Other (Comment) Impaired balance/gait;No Fall  Risks No Fall Risks   Follow up Falls evaluation completed Falls evaluation completed Falls evaluation completed Falls evaluation completed       Data saved with a previous flowsheet row definition    MEDICARE RISK AT HOME:  Medicare Risk at Home Any stairs in or around the home?: No If so, are there any without handrails?: No Home free of loose throw rugs in walkways, pet beds, electrical cords, etc?: Yes Adequate lighting in your home to reduce risk of falls?: Yes Life alert?: No Use of a cane, walker or w/c?: No Grab bars in the bathroom?: No Shower chair or  bench in shower?: No Elevated toilet seat or a handicapped toilet?: No  TIMED UP AND GO:  Was the test performed?  Yes  Length of time to ambulate 10 feet: 10 sec Gait steady and fast without use of assistive device  Cognitive Function: 6CIT completed        04/23/2024    3:12 PM 02/26/2022   11:40 AM  6CIT Screen  What Year? 0 points 0 points  What month? 0 points 0 points  What time? 0 points 0 points  Count back from 20 0 points 0 points  Months in reverse 0 points 0 points  Repeat phrase 0 points 0 points  Total Score 0 points 0 points    Immunizations Immunization History  Administered Date(s) Administered   Fluad Quad(high Dose 65+) 03/24/2019, 03/12/2023   INFLUENZA, HIGH DOSE SEASONAL PF 03/25/2017, 04/11/2018   Influenza Split 04/02/2011, 03/16/2012   Influenza,inj,Quad PF,6+ Mos 03/27/2013, 03/25/2014, 04/22/2015, 04/28/2016   Influenza-Unspecified 04/30/2020, 03/19/2021, 03/05/2022   PFIZER Comirnaty(Gray Top)Covid-19 Tri-Sucrose Vaccine 11/03/2020   PFIZER(Purple Top)SARS-COV-2 Vaccination 08/19/2019, 09/11/2019, 05/09/2020   PNEUMOCOCCAL CONJUGATE-20 03/12/2023   Pfizer Covid-19 Vaccine Bivalent Booster 34yrs & up 03/31/2021   Pfizer(Comirnaty)Fall Seasonal Vaccine 12 years and older 04/13/2022, 03/12/2023   Pneumococcal Conjugate-13 04/28/2016   Pneumococcal Polysaccharide-23 07/20/2001,  07/20/2005   Respiratory Syncytial Virus Vaccine,Recomb Aduvanted(Arexvy) 05/04/2022   Tdap 04/02/2011, 08/02/2015   Zoster Recombinant(Shingrix) 08/28/2020, 10/27/2020   Zoster, Live 08/10/2013    Screening Tests Health Maintenance  Topic Date Due   Hepatitis C Screening  Never done   Influenza Vaccine  02/03/2024   COVID-19 Vaccine (8 - 2025-26 season) 03/05/2024   Medicare Annual Wellness (AWV)  04/23/2025   DTaP/Tdap/Td (3 - Td or Tdap) 08/01/2025   Colonoscopy  11/29/2026   Pneumococcal Vaccine: 50+ Years  Completed   Zoster Vaccines- Shingrix  Completed   Meningococcal B Vaccine  Aged Out    Health Maintenance Items Addressed:   Additional Screening:  Vision Screening: Recommended annual ophthalmology exams for early detection of glaucoma and other disorders of the eye. Is the patient up to date with their annual eye exam?  Yes  Who is the provider or what is the name of the office in which the patient attends annual eye exams? [facility]  Dental Screening: Recommended annual dental exams for proper oral hygiene  Community Resource Referral / Chronic Care Management: CRR required this visit?  No   CCM required this visit?  No   Plan:    I have personally reviewed and noted the following in the patient's chart:   Medical and social history Use of alcohol, tobacco or illicit drugs  Current medications and supplements including opioid prescriptions. Patient is not currently taking opioid prescriptions. Functional ability and status Nutritional status Physical activity Advanced directives List of other physicians Hospitalizations, surgeries, and ER visits in previous 12 months Vitals Screenings to include cognitive, depression, and falls Referrals and appointments  In addition, I have reviewed and discussed with patient certain preventive protocols, quality metrics, and best practice recommendations. A written personalized care plan for preventive  services as well as general preventive health recommendations were provided to patient.   Rojelio LELON Blush, LPN   [id number]   After Visit Summary: (In Person-Printed) AVS printed and given to the patient  Notes: Nothing significant to report at this time.

## 2024-04-24 ENCOUNTER — Ambulatory Visit (INDEPENDENT_AMBULATORY_CARE_PROVIDER_SITE_OTHER): Admitting: Family Medicine

## 2024-04-24 ENCOUNTER — Encounter: Payer: Self-pay | Admitting: Family Medicine

## 2024-04-24 VITALS — BP 118/74 | HR 66 | Temp 98.1°F | Wt 266.0 lb

## 2024-04-24 DIAGNOSIS — M79651 Pain in right thigh: Secondary | ICD-10-CM

## 2024-04-24 DIAGNOSIS — M25511 Pain in right shoulder: Secondary | ICD-10-CM | POA: Diagnosis not present

## 2024-04-24 DIAGNOSIS — R2689 Other abnormalities of gait and mobility: Secondary | ICD-10-CM | POA: Diagnosis not present

## 2024-04-24 DIAGNOSIS — G8929 Other chronic pain: Secondary | ICD-10-CM

## 2024-04-24 MED ORDER — TRAMADOL HCL 50 MG PO TABS: 100.0000 mg | ORAL_TABLET | Freq: Three times a day (TID) | ORAL | 1 refills | Status: DC | PRN

## 2024-04-24 NOTE — Progress Notes (Signed)
   Subjective:    Patient ID: Joseph Tran, male    DOB: 1950/07/17, 73 y.o.   MRN: [id number]  HPI Here for several issues. First he started having stiffness and pain in the right shoulder and the right thigh about 2 months ago. No recent trauma. Heat and Tylenol  help a little. Standing and walking make the thigh pain worse. This is relieved by sitting or lying down. He also has had balance problems for about 3 months. This was intermittent at first, but now  he has this all the time. No headaches. 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 This was followed by scans every 6 months for a few years to assess stability, and it never changed size. The last time he had a brain MRI was on 07-14-20, and the mass measured 2.5 X 3.5 cm. No hearing problems.    Review of Systems  Constitutional: Negative.   Respiratory: Negative.    Cardiovascular: Negative.   Musculoskeletal:  Positive for arthralgias.  Neurological:  Positive for dizziness. Negative for tremors, seizures, syncope, facial asymmetry, speech difficulty, weakness, light-headedness, numbness and headaches.       Objective:   Physical Exam Constitutional:      General: He is not in acute distress.    Appearance: Normal appearance.  Cardiovascular:     Rate and Rhythm: Normal rate and regular rhythm.     Pulses: Normal pulses.     Heart sounds: Normal heart sounds.  Pulmonary:     Effort: Pulmonary effort is normal.     Breath sounds: Normal breath sounds.  Musculoskeletal:     Comments: Right shoulder and right hip are normal on exam with full ROM and no tenderness.   Neurological:     Mental Status: He is alert and oriented to person, place, and time.     Cranial Nerves: No cranial nerve deficit.     Motor: No weakness.     Coordination: Coordination normal.     Gait: Gait normal.           Assessment & Plan:  He seems to be developing some OA, and he can use  Tramadol as needed for the joint pains. As for the balance problem, we will arrange another brain MRI soon. Garnette Olmsted, MD

## 2024-04-29 ENCOUNTER — Other Ambulatory Visit: Payer: Self-pay | Admitting: Family Medicine

## 2024-05-16 ENCOUNTER — Other Ambulatory Visit: Payer: Self-pay | Admitting: Internal Medicine

## 2024-05-16 DIAGNOSIS — Z79899 Other long term (current) drug therapy: Secondary | ICD-10-CM

## 2024-05-16 DIAGNOSIS — E785 Hyperlipidemia, unspecified: Secondary | ICD-10-CM

## 2024-05-16 DIAGNOSIS — I4819 Other persistent atrial fibrillation: Secondary | ICD-10-CM

## 2024-05-18 ENCOUNTER — Other Ambulatory Visit: Payer: Self-pay | Admitting: Internal Medicine

## 2024-05-18 DIAGNOSIS — Z79899 Other long term (current) drug therapy: Secondary | ICD-10-CM

## 2024-05-18 DIAGNOSIS — E785 Hyperlipidemia, unspecified: Secondary | ICD-10-CM

## 2024-05-18 DIAGNOSIS — I4819 Other persistent atrial fibrillation: Secondary | ICD-10-CM

## 2024-05-18 MED ORDER — ROSUVASTATIN CALCIUM 10 MG PO TABS: 10.0000 mg | ORAL_TABLET | Freq: Every day | ORAL | 0 refills | Status: DC

## 2024-05-21 ENCOUNTER — Other Ambulatory Visit: Payer: Self-pay | Admitting: Internal Medicine

## 2024-05-21 ENCOUNTER — Ambulatory Visit: Payer: Self-pay

## 2024-05-21 NOTE — Telephone Encounter (Signed)
 FYI Only or Action Required?: FYI only for provider: pt was not taking pain medication as prescribed, only taking half dose, will be taking medication as prescribed.  Patient was last seen in primary care on 04/24/2024 by Johnny Garnette LABOR, MD.  Called Nurse Triage reporting Leg Pain.  Symptoms began several weeks ago.  Interventions attempted: Prescription medications: tramadol.  Symptoms are: unchanged.  Triage Disposition: See PCP When Office is Open (Within 3 Days)  Patient/caregiver understands and will follow disposition?: no  Copied from CRM #[id number]. Topic: Clinical - Red Word Triage >> May 21, 2024  1:54 PM Rosina BIRCH wrote: Reason for CRM: patient called stating he saw the provider a couple weeks ago about the pain in his upper thigh and he was given tramadol. Patient stated the medication is not woking much and it does not last long. The patient stated the pain is now from his hip to his thigh to his knee and down to his right ankle. Patient is having trouble walking and keeping his balance Reason for Disposition  [1] MODERATE pain (e.g., interferes with normal activities, limping) AND [2] present > 3 days  Answer Assessment - Initial Assessment Questions 1. ONSET: When did the pain start?      Weeks ago 2. LOCATION: Where is the pain located?      Upper thigh, R  3. PAIN: How bad is the pain?    (Scale 1-10; or mild, moderate, severe)     moderate 4. WORK OR EXERCISE: Has there been any recent work or exercise that involved this part of the body?      Denies new work/exercise, denies injury 5. CAUSE: What do you think is causing the leg pain?     Unsure, pt questioning if it is ortho related.  6. OTHER SYMPTOMS: Do you have any other symptoms? (e.g., chest pain, back pain, breathing difficulty, swelling, rash, fever, numbness, weakness)     Pt states that it is impacting his walking, states that he has to lift his leg with his pants to get the injured leg into the  car. Denies SOB. Denies CP.   Has been seen for this, states tramadol works for a short time, but does not completely relieve the pain. Pt questioning if this could be an ortho issue. Pt states that the radiation goes down to his ankle, when he was evaluated he states that it did not radiate down the leg at that time. While speaking to pt, pt states that he is taking 1 tablet of tramadol every 8 hours. Pt advised that rx is 100 mg every 8 hours and that the tablets are 50mg /tab. Pt states that he has not been taking the full dose as he thought the rx stated one. Pt states at this time that he would like to wait to schedule another appt and attempt to take tramadol as rx'd. Pt will call back if the tramadol taken as prescribed does not help.  Protocols used: Leg Pain-A-AH

## 2024-05-22 NOTE — Telephone Encounter (Signed)
 I agree. He can see us  again if this does not help

## 2024-05-24 NOTE — Telephone Encounter (Signed)
 If he agrees, I would like to refer him to Orthopedics for this

## 2024-05-24 NOTE — Telephone Encounter (Signed)
 Called Joseph Tran to check on how he was doing, states that he was still having the pain/weakness in the right hip and throbbing pain in his right thigh, Joseph Tran states that he fell on Tuesday night from weakness in his leg, states that he hit his head but denied any injuries,headache or dizziness. Joseph Tran states that he still feels weak and needs Dr Johnny advise on what to do next. Please advise

## 2024-05-29 ENCOUNTER — Ambulatory Visit: Payer: Self-pay

## 2024-05-29 ENCOUNTER — Other Ambulatory Visit: Payer: Self-pay | Admitting: Family Medicine

## 2024-05-29 NOTE — Telephone Encounter (Unsigned)
 Copied from CRM [id number]. Topic: Clinical - Medication Refill >> May 29, 2024 12:13 PM Shereese L wrote: Medication: traMADol  (ULTRAM ) 50 MG tablet  Has the patient contacted their pharmacy? Yes (Agent: If no, request that the patient contact the pharmacy for the refill. If patient does not wish to contact the pharmacy document the reason why and proceed with request.) (Agent: If yes, when and what did the pharmacy advise?)  This is the patient's preferred pharmacy:    [id number] - [place], [place] - [address] [place] [address] Phone: [phone] Fax: [phone]  Is this the correct pharmacy for this prescription? Yes If no, delete pharmacy and type the correct one.   Has the prescription been filled recently? Yes  Is the patient out of the medication? Yes  Has the patient been seen for an appointment in the last year OR does the patient have an upcoming appointment? Yes  Can we respond through MyChart? Yes  Agent: Please be advised that Rx refills may take up to 3 business days. We ask that you follow-up with your pharmacy.

## 2024-05-29 NOTE — Telephone Encounter (Signed)
 FYI Only or Action Required?: FYI only for provider: appointment scheduled on 05/30/24.  Patient was last seen in primary care on 04/24/2024 by Johnny Garnette LABOR, MD.  Called Nurse Triage reporting Fall.  Symptoms began a week ago.  Interventions attempted: Rest, hydration, or home remedies.  Symptoms are: gradually improving.  Triage Disposition: See Physician Within 24 Hours  Patient/caregiver understands and will follow disposition?: Yes   Copied from CRM [id number]. Topic: Clinical - Red Word Triage >> May 29, 2024 12:17 PM Shereese L wrote: [facility] that prompted transfer to Nurse Triage: Patient fell and hit his head, no headaches, as of now no dizzy spells and off balance Reason for Disposition  [1] NO dizziness now AND [2] one or more stroke risk factors (i.e., hypertension, diabetes, prior stroke/TIA/heart attack)  Answer Assessment - Initial Assessment Questions Additional info: Declined offered acute visit today. Scheduled with pcp 05/30/24 in afternoon as preferred due to working until 130pm.  Patient is aware of ED precautions.    1. DESCRIPTION: Describe your dizziness.     Off balance 2. VERTIGO: Do you feel like either you or the room is spinning or tilting?      No, one week ago while out walking he turned fast lost balance and fell backward, hit head without injury.  3. LIGHTHEADED: Do you feel lightheaded? (e.g., somewhat faint, woozy, weak upon standing)     Denies lightheadedness 4. SEVERITY: How bad is it?  Can you walk?     Able to walk not currently dizzy 5. ONSET:  When did the dizziness begin?     Last week 6. AGGRAVATING FACTORS: Does anything make it worse? (e.g., standing, change in head position)     unidentified 7. CAUSE: What do you think is causing the dizziness?     Unsure-pcp following  8. RECURRENT SYMPTOM: Have you had dizziness before? If Yes, ask: When was the last time? What happened that time?     yes 9. OTHER  SYMPTOMS: Do you have any other symptoms? (e.g., earache, headache, numbness, tinnitus, vomiting, weakness)     Moderate left flank pain -no bruising or swelling. Denies all other symptoms.  10. PREGNANCY: Is there any chance you are pregnant? When was your last menstrual period?  Protocols used: Dizziness - Vertigo-A-AH

## 2024-05-29 NOTE — Telephone Encounter (Signed)
 Left detailed message for pt regarding Dr Johnny recommendation. Advised pt via MyChart to let the office know if agrees to Orthopedics referral

## 2024-05-30 ENCOUNTER — Ambulatory Visit

## 2024-05-30 ENCOUNTER — Encounter: Payer: Self-pay | Admitting: Family Medicine

## 2024-05-30 ENCOUNTER — Ambulatory Visit: Admitting: Family Medicine

## 2024-05-30 ENCOUNTER — Telehealth: Payer: Self-pay

## 2024-05-30 ENCOUNTER — Other Ambulatory Visit (HOSPITAL_COMMUNITY): Payer: Self-pay

## 2024-05-30 VITALS — BP 118/64 | HR 85 | Temp 98.4°F | Wt 264.0 lb

## 2024-05-30 DIAGNOSIS — G910 Communicating hydrocephalus: Secondary | ICD-10-CM

## 2024-05-30 DIAGNOSIS — S0093XA Contusion of unspecified part of head, initial encounter: Secondary | ICD-10-CM | POA: Diagnosis not present

## 2024-05-30 DIAGNOSIS — S298XXA Other specified injuries of thorax, initial encounter: Secondary | ICD-10-CM | POA: Diagnosis not present

## 2024-05-30 DIAGNOSIS — R42 Dizziness and giddiness: Secondary | ICD-10-CM

## 2024-05-30 DIAGNOSIS — R0781 Pleurodynia: Secondary | ICD-10-CM | POA: Diagnosis not present

## 2024-05-30 MED ORDER — TRAMADOL HCL 50 MG PO TABS: 100.0000 mg | ORAL_TABLET | Freq: Three times a day (TID) | ORAL | 5 refills | Status: AC | PRN

## 2024-05-30 NOTE — Telephone Encounter (Signed)
 Noted

## 2024-05-30 NOTE — Telephone Encounter (Signed)
 Pt has appointment with Dr Johnny this afternoon regarding this

## 2024-05-30 NOTE — Telephone Encounter (Signed)
 Pharmacy Patient Advocate Encounter   Received notification from Onbase that prior authorization for traMADol  HCl 50MG  tablets is required/requested.   Insurance verification completed.   The patient is insured through [facility].   Per test claim: Refill too soon. PA is not needed at this time. Medication was filled 05/30/24. Next eligible fill date is 06/03/24.  -RX ready at pharmacy, copay $0.00

## 2024-05-30 NOTE — Progress Notes (Signed)
   Subjective:    Patient ID: Joseph Tran, male    DOB: March 29, 1951, 73 y.o.   MRN: [id number]  HPI Here for left sided back pain after a fall on 05-27-24. He was working with his horses, and as he was walking up a hill he lost his balance and fell backward. He hit the back of his head on the ground, but there was no LOC. He had a headache for 2 days and then this went away. He has had sharp pain in the left side since then. He is taking Tylenol  with mixed results. He also describes his dizziness, which bothers him every day. We discussed this at our last visit on 04-24-24, and we ordered a brain MRI. This has been scheduled for 06-03-24. He has daily spells where he finds it hard to keep his balance. He uses a cane when he leaves his house.    Review of Systems  Constitutional: Negative.   Respiratory: Negative.    Cardiovascular: Negative.   Musculoskeletal:  Positive for back pain.  Neurological:  Positive for dizziness. Negative for headaches.       Objective:   Physical Exam Constitutional:      Comments: He walks slowly with some pain   Cardiovascular:     Rate and Rhythm: Normal rate and regular rhythm.     Pulses: Normal pulses.     Heart sounds: Normal heart sounds.  Pulmonary:     Effort: Pulmonary effort is normal.     Breath sounds: Normal breath sounds.  Musculoskeletal:     Comments: He is quite tender in the left middle back over the left posterior lower ribs   Neurological:     Mental Status: He is alert and oriented to person, place, and time. Mental status is at baseline.     Coordination: Coordination normal.     Gait: Gait normal.           Assessment & Plan:  He had a head contusion which has now resolved. He also had a left rib contusion with possible cracked ribs. We will get Xrays today to get a better look. He can use ice and Tramadol  as needed for pain. As far as the dizziness, he is scheduled for the brain MRI as above, and we will refer him to  Neurology to evaluate.  Garnette Olmsted, MD

## 2024-06-03 ENCOUNTER — Ambulatory Visit
Admission: RE | Admit: 2024-06-03 | Discharge: 2024-06-03 | Disposition: A | Source: Ambulatory Visit | Attending: Family Medicine | Admitting: Family Medicine

## 2024-06-03 DIAGNOSIS — R270 Ataxia, unspecified: Secondary | ICD-10-CM | POA: Diagnosis not present

## 2024-06-03 DIAGNOSIS — R2689 Other abnormalities of gait and mobility: Secondary | ICD-10-CM

## 2024-06-03 MED ORDER — GADOPICLENOL 0.5 MMOL/ML IV SOLN
10.0000 mL | Freq: Once | INTRAVENOUS | Status: AC | PRN
  Administered 2024-06-03: 10 mL via INTRAVENOUS

## 2024-06-04 ENCOUNTER — Ambulatory Visit: Payer: Self-pay

## 2024-06-04 NOTE — Telephone Encounter (Signed)
 Patient would like someone to call him when his xray results come back  FYI Only or Action Required?: Action required by provider: lab or test result follow-up needed and checking on xray results.  Patient was last seen in primary care on 05/30/2024 by Johnny Garnette LABOR, MD.  Called Nurse Triage reporting Results.  Interventions attempted: Prescription medications: Tramadol , Rest, hydration, or home remedies, and Ice/heat application.  Symptoms are: unchanged.  Triage Disposition: Call PCP When Office is Open  Patient/caregiver understands and will follow disposition?: Yes              Copied from CRM #[id number]. Topic: Clinical - Red Word Triage >> Jun 04, 2024 12:26 PM Joseph Tran wrote: Red Word that prompted transfer to Nurse Triage: severe pain in back, pt fell last week and waiting on xray results. Still in pain, Reason for Disposition  [1] Caller requesting NON-URGENT health information AND [2] PCP's office is the best resource  Answer Assessment - Initial Assessment Questions Patient seen on 05/30/2024 by PCP after a fall--had an xray done Patient states that the longer he is on his feet, the more his back hurts Patient is taking Tramadol  and using heat at home He states the back pain is not any worse than it was--it just is not any worse Patient denies any increase in rib pain but states it is not any better Patient denies difficulty breathing  He also states that he had the MRI done yesterday and getting off that hard exam table was tough on his back Patient just wanted to check on the imaging results for his ribs and states that nothing is different at this time---just not better Patient is advised that these results were not back yet  Patient is advised to call us  back if anything changes or with any further questions/concerns. Patient is advised that if anything worsens to go to the Emergency Room or call 911. Patient verbalized understanding.  Protocols used:  Information Only Call - No Triage-A-AH

## 2024-06-06 ENCOUNTER — Encounter: Payer: Self-pay | Admitting: Family

## 2024-06-07 NOTE — Telephone Encounter (Signed)
 Pt is aware that x-ray results are not back, pt will be notified when back

## 2024-06-08 ENCOUNTER — Ambulatory Visit: Payer: Self-pay | Admitting: Family Medicine

## 2024-06-11 ENCOUNTER — Ambulatory Visit: Payer: Self-pay | Admitting: Family Medicine

## 2024-06-14 ENCOUNTER — Encounter: Payer: Self-pay | Admitting: Internal Medicine

## 2024-06-14 ENCOUNTER — Ambulatory Visit: Admitting: Neurology

## 2024-06-14 ENCOUNTER — Encounter: Payer: Self-pay | Admitting: Neurology

## 2024-06-14 ENCOUNTER — Encounter: Payer: Self-pay | Admitting: Family

## 2024-06-14 ENCOUNTER — Telehealth: Payer: Self-pay | Admitting: Neurology

## 2024-06-14 VITALS — BP 143/87 | HR 81 | Ht 72.0 in | Wt 264.0 lb

## 2024-06-14 DIAGNOSIS — G9389 Other specified disorders of brain: Secondary | ICD-10-CM

## 2024-06-14 DIAGNOSIS — I4819 Other persistent atrial fibrillation: Secondary | ICD-10-CM | POA: Diagnosis not present

## 2024-06-14 DIAGNOSIS — R269 Unspecified abnormalities of gait and mobility: Secondary | ICD-10-CM

## 2024-06-14 DIAGNOSIS — I10 Essential (primary) hypertension: Secondary | ICD-10-CM

## 2024-06-14 DIAGNOSIS — E785 Hyperlipidemia, unspecified: Secondary | ICD-10-CM

## 2024-06-14 DIAGNOSIS — R2689 Other abnormalities of gait and mobility: Secondary | ICD-10-CM

## 2024-06-14 NOTE — Progress Notes (Signed)
 [facility]  PATIENT: Joseph Tran DOB: February 09, 1951  REFERRING DOCTOR OR PCP: Garnette Olmsted, MD SOURCE: Patient, notes from primary care, imaging and lab results, MRI images personally reviewed.  _________________________________   HISTORICAL  CHIEF COMPLAINT:  Chief Complaint  Patient presents with   New Patient (Initial Visit)    Rm10, alone,  internal referral for Dizziness: orthostatic bp completed, pt also has shuffling gait     HISTORY OF PRESENT ILLNESS:  I had the pleasure of seeing patient, Joseph Tran, at [facility] for neurologic consultation regarding his dizziness and orthostasis and gait change  He is a 73 year is a man with a known acoustic schwannoma, AFib (s/p ablation in 2024), HLD, HTN, BPH who reports reduced balance over the past few months.  He has had 2 falls when he lost his balance after turning his head or turns around.    He feels he does best if he balances himself by holding a wall when he turns.   He notes his gait and balance were fine until earlier in 2025.       He has a reduced stride since earlier in 2025.   Recently, he has rare urinary incontinence when he gets urgency upon standing.   He notes mild reduction in cognition (learning scripts).   No change in mood this year.   He denies tremors.   He had a concussion falling from a horse and hitting his head in 2019.    He has mild neck pain since a whiplash in mid 2025.  He fell bacwards while tying his shoe.  He did acupuncture with benefit.      He has had some myalgias on Crestor .  After stopping last week, pan is better.    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   Because it has been stable in size he has not needed surgery or gamma knife and the tumor is being followed.  Most recent MRI in November 2025 did not show any significant change in size.  Imaging: MRI of  the head with added attention to the internal auditory canals 06/03/2024 showed mild ventriculomegaly most consistent with generalized cortical atrophy.  The callosal angle at the level of the anterior commissure was normal.  Mild corpus callosal atrophy.  There is a 4 mm enhancing nodule involving the 8th nerve on the left..  There is mild chronic microvascular ischemic change.  No acute ischemic findings.  Opacification of the left maxillary sinus and left frontal sinus and some of the ethmoid air cells   COmpared to 2022 - more ventriculomegaly noted  Carotid u/s 10/06/2023 showed mild plaque in left ICA  REVIEW OF SYSTEMS: Constitutional: No fevers, chills, sweats, or change in appetite Eyes: No visual changes, double vision, eye pain Ear, nose and throat: He has left-sided hearing loss due to an acute 6 for normal Cardiovascular: No chest pain.  He has A-fib and is on Xarelto   respiratory:  No shortness of breath at rest or with exertion.   No wheezes GastrointestinaI: No nausea, vomiting, diarrhea, abdominal pain, fecal incontinence Genitourinary: He has urinary urgency at times with rare urge incontinence.  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 Musculoskeletal:  No neck pain, back pain Integumentary: No rash, pruritus, skin lesions Neurological: as above Psychiatric: Depression has done well on medications Endocrine: No palpitations, diaphoresis, change in appetite, change in weigh or  increased thirst Hematologic/Lymphatic:  No anemia, purpura, petechiae.SABRA  History of DVT Allergic/Immunologic: No itchy/runny eyes, nasal congestion, recent allergic reactions, rashes  ALLERGIES: Allergies[1]  HOME MEDICATIONS: Current Medications[2]  PAST MEDICAL HISTORY: Past Medical History:  Diagnosis Date   Acoustic neuroma (HCC)    benign - left   Allergy    Anxiety    Arthritis    Asthma    seasonal, when pollen is high, cold air closes me up   Atrial fibrillation (HCC)    Cancer (HCC)    skin  cancer -- arm, scalp, upper back   Chronic kidney disease    sees Dr. Jeanne Zekan at [facility], left kidney is non=functioning.  right kidny is at 50%   Clotting disorder    Hx DVT - Xarelto    Colon polyps    Complication of anesthesia    Post op nausea/vomiting   Depression    severe.  dx 1985   DVT (deep venous thrombosis) (HCC)    sees Dr. Maude Crease    History of kidney stones    has kidney stone now.     Hypertension    Hypogonadism male    sees Dr. Redell Napoleon at [facility]   Neuropathy of both feet    OSA (obstructive sleep apnea)    Plantar fasciitis    rt foot   Pneumonia    PONV (postoperative nausea and vomiting)    Renal atrophy, left    sees Dr. Redell Napoleon at [facility]   Sleep apnea    tested 2010  - wears c-pap    PAST SURGICAL HISTORY: Past Surgical History:  Procedure Laterality Date   ATRIAL FIBRILLATION ABLATION N/A 06/17/2023   Procedure: ATRIAL FIBRILLATION ABLATION;  Surgeon: Kennyth Chew, MD;  Location: [facility] INVASIVE CV LAB;  Service: Cardiovascular;  Laterality: N/A;   CARDIOVERSION N/A 01/05/2023   Procedure: CARDIOVERSION;  Surgeon: Lonni Slain, MD;  Location: [facility] INVASIVE CV LAB;  Service: Cardiovascular;  Laterality: N/A;   CHEST WALL TUMOR EXCISION  2007   COLONOSCOPY  04/07/2020   per Dr. Albertus, adenomatous polyps, repeat in 3 yrs    kidney caluculs  2004-05   KIDNEY STONE SURGERY     x 2c   KNEE ARTHROSCOPY  09/10/2011   right knee, per Dr. Norleen Gavel    KNEE ARTHROSCOPY Right 04/25/2017   Procedure: RIGHT KNEE ARTHROSCOPY, PARTIAL LATERAL MENISECTOMY, CHONDROPLASTY MEDIAL AND LATERAL PATELLAOFEMORAL;  Surgeon: Gavel Norleen, MD;  Location: [facility] OR;  Service: Orthopedics;  Laterality: Right;   KNEE ARTHROSCOPY Left 09/18/2021   Procedure: ARTHROSCOPY KNEE;  Surgeon: Gavel Norleen, MD;  Location: [facility] ORS;  Service: Orthopedics;  Laterality: Left;   KNEE ARTHROSCOPY WITH MEDIAL MENISECTOMY Left 09/18/2021    Procedure: KNEE ARTHROSCOPY WITH MEDIAL MENISECTOMY;  Surgeon: Gavel Norleen, MD;  Location: [facility];  Service: Orthopedics;  Laterality: Left;   madiscus cartilage  2009   MOHS SURGERY     REPLACEMENT TOTAL KNEE Left 09/2022   right knee arthroscopy  2008   TONSILLECTOMY     TOTAL KNEE ARTHROPLASTY Right 01/19/2019   Procedure: RIGHT TOTAL KNEE ARTHROPLASTY;  Surgeon: Gavel Norleen, MD;  Location: [facility];  Service: Orthopedics;  Laterality: Right;   TOTAL KNEE ARTHROPLASTY Left 06/14/2022   Procedure: TOTAL KNEE ARTHROPLASTY;  Surgeon: Gavel Norleen, MD;  Location: [facility];  Service: Orthopedics;  Laterality: Left;   UPPER GASTROINTESTINAL ENDOSCOPY      FAMILY HISTORY: Family History  Problem Relation  Age of Onset   Heart disease Father    Throat cancer Maternal Grandmother    Liver cancer Paternal Grandmother    Heart disease Other        parents   Hyperlipidemia Other    Stroke Other        grandparents   Sudden death Other        uncle less than 32 yrs old   Colon cancer Neg Hx    Esophageal cancer Neg Hx    Rectal cancer Neg Hx    Stomach cancer Neg Hx     SOCIAL HISTORY: Social History   Socioeconomic History   Marital status: Single    Spouse name: Not on file   Number of children: 0   Years of education: Not on file   Highest education level: Bachelor's degree (e.g., BA, AB, BS)  Occupational History   Occupation: retired  Tobacco Use   Smoking status: Never   Smokeless tobacco: Never   Tobacco comments:    Never smoked 04/01/23  Vaping Use   Vaping status: Never Used  Substance and Sexual Activity   Alcohol use: Never   Drug use: Never   Sexual activity: Not Currently  Other Topics Concern   Not on file  Social History Narrative   Not on file   Social Drivers of Health   Tobacco Use: Low Risk (06/14/2024)   Patient History    Smoking Tobacco Use: Never    Smokeless Tobacco Use: Never    Passive Exposure: Not on file  Financial Resource Strain:  Low Risk (05/29/2024)   Overall Financial Resource Strain (CARDIA)    Difficulty of Paying Living Expenses: Not very hard  Food Insecurity: No Food Insecurity (05/29/2024)   Epic    Worried About Radiation Protection Practitioner of Food in the Last Year: Never true    Ran Out of Food in the Last Year: Never true  Transportation Needs: No Transportation Needs (05/29/2024)   Epic    Lack of Transportation (Medical): No    Lack of Transportation (Non-Medical): No  Physical Activity: Sufficiently Active (05/29/2024)   Exercise Vital Sign    Days of Exercise per Week: 5 days    Minutes of Exercise per Session: 60 min  Recent Concern: Physical Activity - Inactive (04/23/2024)   Exercise Vital Sign    Days of Exercise per Week: 0 days    Minutes of Exercise per Session: 0 min  Stress: No Stress Concern Present (04/23/2024)   [employer] of Occupational Health - Occupational Stress Questionnaire    Feeling of Stress: Not at all  Social Connections: Socially Isolated (05/29/2024)   Social Connection and Isolation Panel    Frequency of Communication with Friends and Family: Twice a week    Frequency of Social Gatherings with Friends and Family: More than three times a week    Attends Religious Services: Patient declined    Active Member of Clubs or Organizations: No    Attends Banker Meetings: Not on file    Marital Status: Never married  Intimate Partner Violence: Not At Risk (04/23/2024)   Epic    Fear of Current or Ex-Partner: No    Emotionally Abused: No    Physically Abused: No    Sexually Abused: No  Depression (PHQ2-9): Low Risk (04/23/2024)   Depression (PHQ2-9)    PHQ-2 Score: 0  Alcohol Screen: Low Risk (04/23/2024)   Alcohol Screen    Last Alcohol Screening Score (AUDIT): 0  Housing:  Unknown (05/29/2024)   Epic    Unable to Pay for Housing in the Last Year: No    Number of Times Moved in the Last Year: Not on file    Homeless in the Last Year: No  Utilities: Not At Risk  (04/23/2024)   Epic    Threatened with loss of utilities: No  Health Literacy: Adequate Health Literacy (04/23/2024)   B1300 Health Literacy    Frequency of need for help with medical instructions: Never       PHYSICAL EXAM  Vitals:   06/14/24 1256 06/14/24 1258 06/14/24 1259  BP: (!) 151/84 128/83 (!) 143/87  Pulse: 73 76 81  SpO2: 94% 94% 94%  Weight: 264 lb (119.7 kg)    Height: 6' (1.829 m)      Body mass index is 35.8 kg/m.   General: The patient is well-developed and well-nourished and in no acute distress  HEENT:  Head is [place]/AT.  Sclera are anicteric.    Neck: No carotid bruits are noted.  The neck is nontender.  Cardiovascular: The heart has a regular rate and rhythm with a normal S1 and S2. There were no murmurs, gallops or rubs.    Skin: Extremities are without rash or  edema.  Musculoskeletal:  Back is nontender  Neurologic Exam  Mental status: The patient is alert and oriented x 3 at the time of the examination. The patient has apparent normal recent and remote memory, with an apparently normal attention span and concentration ability.   Speech is normal.  Cranial nerves: Extraocular movements are full. Pupils are equal, round, and reactive to light and accomodation.  Visual fields are full.  Facial symmetry is present. There is good facial sensation to soft touch bilaterally.Facial strength is normal.  Trapezius and sternocleidomastoid strength is normal. No dysarthria is noted.  The tongue is midline, and the patient has symmetric elevation of the soft palate. Reduced hearing on left.  Weber did not lateralize  Motor:  Muscle bulk is normal.   Tone is normal. Strength is  5 / 5 in all 4 extremities.   Sensory: Sensory testing is intact to pinprick, soft touch and vibration sensation in all 4 extremities.  Coordination: Cerebellar testing reveals good finger-nose-finger and heel-to-shin bilaterally.  Gait and station: Station is normal.   Gait has a  reduced stride and takes 6 steps to turn 180 degrees.  Does nt have much retropulsion (minimal, normal for age).   Cannot tandem walk. Romberg is negative.   Reflexes: Deep tendon reflexes are 1 in arms and 3+ at knees (spread), 1 at ankles.   Plantar responses are flexor.    DIAGNOSTIC DATA (LABS, IMAGING, TESTING) - I reviewed patient records, labs, notes, testing and imaging myself where available.  Lab Results  Component Value Date   WBC 5.3 11/11/2023   HGB 13.7 11/11/2023   HCT 41.5 11/11/2023   MCV 94.5 11/11/2023   PLT 178.0 11/11/2023      Component Value Date/Time   NA 143 11/11/2023 1007   NA 145 (H) 05/24/2023 1519   NA 145 06/29/2017 1452   K 4.8 11/11/2023 1007   K 4.7 06/29/2017 1452   CL 112 11/11/2023 1007   CL 107 06/29/2017 1452   CO2 24 11/11/2023 1007   CO2 25 06/29/2017 1452   GLUCOSE 91 11/11/2023 1007   GLUCOSE 85 06/29/2017 1452   BUN 22 11/11/2023 1007   BUN 20 05/24/2023 1519   BUN 33 (H) 06/29/2017 1452  CREATININE 1.73 (H) 11/11/2023 1007   CREATININE 1.80 (H) 12/03/2022 1039   CREATININE 1.56 (H) 06/04/2020 0950   CALCIUM  9.5 11/11/2023 1007   CALCIUM  9.5 06/29/2017 1452   PROT 6.4 05/24/2023 1519   PROT 6.9 06/29/2017 1452   ALBUMIN 4.0 05/24/2023 1519   ALBUMIN 4.2 09/20/2016 1441   AST 22 05/24/2023 1519   AST 14 (L) 12/03/2022 1039   ALT 20 05/24/2023 1519   ALT 13 12/03/2022 1039   ALT 23 06/29/2017 1452   ALKPHOS 92 05/24/2023 1519   ALKPHOS 83 06/29/2017 1452   BILITOT 0.5 05/24/2023 1519   BILITOT 0.6 12/03/2022 1039   GFRNONAA 39 (L) 01/06/2023 0101   GFRNONAA 40 (L) 12/03/2022 1039   GFRNONAA 45 (L) 06/04/2020 0950   GFRAA 52 (L) 06/04/2020 0950   Lab Results  Component Value Date   CHOL 200 07/21/2021   HDL 47.80 07/21/2021   LDLCALC 126 (H) 07/21/2021   TRIG 129.0 07/21/2021   CHOLHDL 4 07/21/2021   Lab Results  Component Value Date   HGBA1C 5.3 11/11/2023   Lab Results  Component Value Date   VITAMINB12  299 11/11/2023   Lab Results  Component Value Date   TSH 1.62 11/11/2023       ASSESSMENT AND PLAN  Gait disturbance - Plan: MR CERVICAL SPINE WO CONTRAST  Imbalance  Cerebral ventriculomegaly  Persistent atrial fibrillation (HCC)  Essential hypertension   In summary, Joseph Tran is a 73 year old man who has had gait disturbance and imbalance over the past few months.  Additionally, he has had some episodes of urinary incontinence.  The MRI of the brain shows ventriculomegaly, likely in proportion to the extent of generalized cortical atrophy.  However, it has increased compared to the 2022 MRI.  He does not have a reduced callosal angle or sulcal effacement at the vertex, findings which would commonly be seen with normal pressure hydrocephalus.  Therefore, I do not think that NPH is his diagnosis though the reduced stride and recent urinary symptoms could be consistent.  Deep tendon reflexes were increased.  Due to the gait disturbance and increased reflexes we need to check an MRI of the cervical spine to make sure that there is not a cervical myelopathy or severe spinal stenosis that could be explaining his symptoms If the MRI of the cervical spine does not show an etiology to his symptoms, then could consider a lumbar puncture to determine if high-volume CSF removal helps his symptoms.  That would be consistent with NPH and shunt could be considered.  As he is on Xarelto , this would have to be held for 2 days before an LP We will let him know the results of the MRI and whether or not we need to proceed with LP.  I did not schedule follow-up but this may be needed based on the results.  He should call for significant new or worsening neurologic symptoms  Thank you for asking me to see this patient.  Please let me know if I can be of further assistance with him or other patients in the future.   Mri cerv    if ok then consider LP (is on Xaelto - Dr. Okey cards) for NPH (likley not due  to MRI appearance)   Mariesa Grieder A. Vear, MD, [facility] 06/14/2024, 2:30 PM Certified in Neurology, Clinical Neurophysiology, Sleep Medicine and Neuroimaging  [facility] [address], [address] [place], [place] [address] [phone]     [1]  Allergies  Allergen Reactions   Allopurinol Other (See Comments)    PATIENT PREFERENCE Pt refused due to mom developing steven johnson syndrome.   Penicillins Hives   Sulfamethoxazole Hives   Sulfonamide Derivatives Hives   Crestor  [Rosuvastatin ]     myalgias   Dilaudid  [Hydromorphone  Hcl] Nausea And Vomiting  [2]  Current Outpatient Medications:    acetaminophen  (TYLENOL ) 500 MG tablet, Take 1,000 mg by mouth as needed for moderate pain (pain score 4-6) or mild pain (pain score 1-3)., Disp: , Rfl:    albuterol  (PROAIR  HFA) 108 (90 Base) MCG/ACT inhaler, INHALE 2 PUFFS EVERY 4  HOURS AS NEEDED FOR  WHEEZING OR SHORTNESS OF  BREATH, Disp: 51 g, Rfl: 3   Apple Cider Vinegar 600 MG CAPS, Take 600 mg by mouth at bedtime., Disp: , Rfl:    b complex vitamins capsule, Take 1 capsule by mouth at bedtime. With vit B12, Disp: , Rfl:    bisacodyl  (DULCOLAX) 5 MG EC tablet, Take 5 mg by mouth at bedtime., Disp: , Rfl:    buPROPion  (WELLBUTRIN  XL) 300 MG 24 hr tablet, TAKE 1 TABLET BY MOUTH DAILY, Disp: 90 tablet, Rfl: 3   carvedilol  (COREG ) 12.5 MG tablet, TAKE 1 TABLET BY MOUTH TWICE  DAILY, Disp: 30 tablet, Rfl: 0   Cholecalciferol  50 MCG (2000 UT) TABS, Take 2,000 Units by mouth at bedtime., Disp: , Rfl:    clindamycin  (CLEOCIN ) 300 MG capsule, Take 600 mg by mouth See admin instructions. Take 600 mg 1 hour prior to dental work, Disp: , Rfl:    clonazePAM  (KLONOPIN ) 1 MG tablet, TAKE 1 TABLET(1 MG) BY MOUTH TWICE DAILY AS NEEDED FOR ANXIETY, Disp: 180 tablet, Rfl: 1   desvenlafaxine  (PRISTIQ ) 50 MG 24 hr tablet, TAKE 1 TABLET BY MOUTH DAILY, Disp: 90 tablet, Rfl: 3   furosemide  (LASIX ) 20 MG tablet, TAKE 1 TABLET BY MOUTH  DAILY AS  NEEDED, Disp: 90 tablet, Rfl: 3   hydrALAZINE  (APRESOLINE ) 50 MG tablet, TAKE 1 TABLET BY MOUTH 3 TIMES  DAILY, Disp: 270 tablet, Rfl: 3   Hypromellose (GENTEAL MILD OP), Place 1 drop into both eyes daily., Disp: , Rfl:    senna (SENOKOT) 8.6 MG TABS tablet, Take 1 tablet by mouth at bedtime as needed for mild constipation. Vegetable, Disp: , Rfl:    tamsulosin  (FLOMAX ) 0.4 MG CAPS capsule, TAKE 1 CAPSULE BY MOUTH DAILY, Disp: 90 capsule, Rfl: 3   traMADol  (ULTRAM ) 50 MG tablet, Take 2 tablets (100 mg total) by mouth every 8 (eight) hours as needed., Disp: 60 tablet, Rfl: 5   XARELTO  20 MG TABS tablet, TAKE 1 TABLET BY MOUTH DAILY  WITH SUPPER, Disp: 90 tablet, Rfl: 3

## 2024-06-14 NOTE — Telephone Encounter (Signed)
 no auth required sent to GI [phone]

## 2024-06-15 ENCOUNTER — Ambulatory Visit
Admission: RE | Admit: 2024-06-15 | Discharge: 2024-06-15 | Disposition: A | Source: Ambulatory Visit | Attending: Neurology | Admitting: Neurology

## 2024-06-15 DIAGNOSIS — R269 Unspecified abnormalities of gait and mobility: Secondary | ICD-10-CM

## 2024-06-15 NOTE — Telephone Encounter (Signed)
 Please check if he has tried lipitor in past      If not then I would recomm a trial   It is in the statin family but metabolism/structure is different from Crestor    May not cross react with side effects  Recomm 10 mg daily   Follow up NMR panel and liver panel and CK in 8 wks    If he has failed lipitor in past then let me know.

## 2024-06-16 ENCOUNTER — Ambulatory Visit: Payer: Self-pay | Admitting: Neurology

## 2024-06-17 ENCOUNTER — Encounter: Payer: Self-pay | Admitting: Neurology

## 2024-06-18 ENCOUNTER — Other Ambulatory Visit: Payer: Self-pay | Admitting: Family Medicine

## 2024-06-18 MED ORDER — ATORVASTATIN CALCIUM 10 MG PO TABS: 10.0000 mg | ORAL_TABLET | Freq: Every day | ORAL | 3 refills | Status: AC

## 2024-06-19 ENCOUNTER — Telehealth: Payer: Self-pay | Admitting: *Deleted

## 2024-06-19 NOTE — Telephone Encounter (Signed)
 Copied from CRM [id number]. Topic: Clinical - Lab/Test Results >> Jun 04, 2024 12:21 PM Joesph B wrote: Reason for CRM:  Seen for a fall and back injury last week and calling for results for xray.

## 2024-06-20 NOTE — Telephone Encounter (Signed)
 Spoke with the patient as it appears the results were discussed with the patient on December 12th by Karpuih.  He stated he did not call recently, I apologized for the call as this may be an older message.

## 2024-06-22 NOTE — Telephone Encounter (Signed)
 I spoke to Mr. Jay.   We discussed the MRI of the cervical spine did not offer any explanation for his gait issues.  The MRI of the brain does show ventriculomegaly that appears to be a little out of proportion to the extent of generalized cortical atrophy.  Therefore, there could be an element of normal pressure hydrocephalus.  We discussed considering a lumbar puncture.  He does note that there has been some minimal improvement since he stopped the Crestor .  I think it is reasonable to give this a couple more weeks to see if he notes more improvement.  If he does not note significant further improvement, then I would recommend that he proceed with a high-volume lumbar puncture to determine if gait improves.  He is on Eliquis and we would need to make sure that he could go off for a few days to get this procedure done.  He is going to give us  a call in about 2 to 3 weeks to let us  know if there has been significan further improvement.

## 2024-06-27 ENCOUNTER — Other Ambulatory Visit: Payer: Self-pay | Admitting: Internal Medicine

## 2024-06-27 DIAGNOSIS — E785 Hyperlipidemia, unspecified: Secondary | ICD-10-CM

## 2024-06-27 DIAGNOSIS — I4819 Other persistent atrial fibrillation: Secondary | ICD-10-CM

## 2024-06-27 DIAGNOSIS — Z79899 Other long term (current) drug therapy: Secondary | ICD-10-CM

## 2024-07-04 ENCOUNTER — Other Ambulatory Visit: Payer: Self-pay | Admitting: Internal Medicine

## 2024-07-19 ENCOUNTER — Encounter: Payer: Self-pay | Admitting: Internal Medicine

## 2024-07-19 ENCOUNTER — Telehealth: Payer: Self-pay | Admitting: Internal Medicine

## 2024-07-19 DIAGNOSIS — E785 Hyperlipidemia, unspecified: Secondary | ICD-10-CM

## 2024-07-19 NOTE — Telephone Encounter (Signed)
 Pt sent in multiple encounters. See Mychart 07/19/24. Closing this encounter

## 2024-07-19 NOTE — Telephone Encounter (Signed)
" ° °  Pt states he stopped taking Atorvastatin  due to leg cramps as well as with the Crestor . Please advise.  "

## 2024-07-20 ENCOUNTER — Other Ambulatory Visit: Payer: Self-pay | Admitting: Internal Medicine

## 2024-07-22 NOTE — Telephone Encounter (Signed)
 Agree with stopping statin   Make sure both statins on intolerance list Would he be willing to try Zetia   10 mg   Unrelated to statins  Keep watching diet as well   Minimize ultraprocessed foods  Check NMR panel and liver panel in 8 wks

## 2024-07-23 NOTE — Addendum Note (Signed)
 Addended by: JOSHUA ANDREZ PARAS on: 07/23/2024 03:01 PM   Modules accepted: Orders

## 2024-07-23 NOTE — Telephone Encounter (Signed)
 Overdue CMP

## 2024-08-02 ENCOUNTER — Telehealth: Payer: Self-pay | Admitting: Internal Medicine

## 2024-08-02 MED ORDER — EZETIMIBE 10 MG PO TABS: 10.0000 mg | ORAL_TABLET | Freq: Every day | ORAL | 2 refills | Status: AC

## 2024-08-02 NOTE — Telephone Encounter (Signed)
 Zetia  prescription sent in to preferred pharmacy per Dr Okey note/encounter 1/18-1/19

## 2024-08-02 NOTE — Telephone Encounter (Signed)
" °*  STAT* If patient is at the pharmacy, call can be transferred to refill team.   1. Which medications need to be refilled? (please list name of each medication and dose if known) Zetia  10 MG  2. Which pharmacy/location (including street and city if local pharmacy) is medication to be sent to? [id number] - [place], [place] - [address]   3. Do they need a 30 day or 90 day supply?   90 day supply  See 1/15 MyChart encounter--prescription was not sent to pharmacy. "

## 2024-08-08 ENCOUNTER — Other Ambulatory Visit: Payer: Self-pay | Admitting: Internal Medicine

## 2024-08-08 ENCOUNTER — Other Ambulatory Visit: Payer: Self-pay | Admitting: Family Medicine

## 2024-09-17 ENCOUNTER — Ambulatory Visit: Admitting: Student

## 2025-04-29 ENCOUNTER — Ambulatory Visit
